# Patient Record
Sex: Female | Born: 1950
Health system: Southern US, Community
[De-identification: ages and names within clinical notes are randomized; demographics above are authoritative.]

## PROBLEM LIST (undated history)

## (undated) DIAGNOSIS — E119 Type 2 diabetes mellitus without complications: Secondary | ICD-10-CM

## (undated) DIAGNOSIS — E538 Deficiency of other specified B group vitamins: Secondary | ICD-10-CM

## (undated) DIAGNOSIS — F32A Depression, unspecified: Secondary | ICD-10-CM

## (undated) DIAGNOSIS — Z7901 Long term (current) use of anticoagulants: Secondary | ICD-10-CM

## (undated) DIAGNOSIS — K219 Gastro-esophageal reflux disease without esophagitis: Secondary | ICD-10-CM

## (undated) DIAGNOSIS — M1990 Unspecified osteoarthritis, unspecified site: Secondary | ICD-10-CM

## (undated) DIAGNOSIS — T8859XA Other complications of anesthesia, initial encounter: Secondary | ICD-10-CM

## (undated) DIAGNOSIS — G4700 Insomnia, unspecified: Secondary | ICD-10-CM

## (undated) DIAGNOSIS — D219 Benign neoplasm of connective and other soft tissue, unspecified: Secondary | ICD-10-CM

## (undated) DIAGNOSIS — I10 Essential (primary) hypertension: Secondary | ICD-10-CM

## (undated) DIAGNOSIS — R197 Diarrhea, unspecified: Secondary | ICD-10-CM

## (undated) DIAGNOSIS — E7800 Pure hypercholesterolemia, unspecified: Secondary | ICD-10-CM

## (undated) DIAGNOSIS — D510 Vitamin B12 deficiency anemia due to intrinsic factor deficiency: Secondary | ICD-10-CM

## (undated) DIAGNOSIS — G4730 Sleep apnea, unspecified: Secondary | ICD-10-CM

## (undated) DIAGNOSIS — T4145XA Adverse effect of unspecified anesthetic, initial encounter: Secondary | ICD-10-CM

## (undated) DIAGNOSIS — C4491 Basal cell carcinoma of skin, unspecified: Secondary | ICD-10-CM

## (undated) DIAGNOSIS — F329 Major depressive disorder, single episode, unspecified: Secondary | ICD-10-CM

## (undated) DIAGNOSIS — F419 Anxiety disorder, unspecified: Secondary | ICD-10-CM

## (undated) DIAGNOSIS — I480 Paroxysmal atrial fibrillation: Secondary | ICD-10-CM

## (undated) DIAGNOSIS — G43909 Migraine, unspecified, not intractable, without status migrainosus: Secondary | ICD-10-CM

## (undated) HISTORY — DX: Vitamin B12 deficiency anemia due to intrinsic factor deficiency: D51.0

## (undated) HISTORY — DX: Benign neoplasm of connective and other soft tissue, unspecified: D21.9

## (undated) HISTORY — DX: Long term (current) use of anticoagulants: Z79.01

## (undated) HISTORY — DX: Paroxysmal atrial fibrillation: I48.0

## (undated) HISTORY — DX: Insomnia, unspecified: G47.00

## (undated) HISTORY — PX: ACHILLES TENDON SURGERY: SHX542

## (undated) HISTORY — DX: Diarrhea, unspecified: R19.7

## (undated) HISTORY — PX: JOINT REPLACEMENT: SHX530

## (undated) HISTORY — PX: COLONOSCOPY: SHX174

## (undated) HISTORY — DX: Deficiency of other specified B group vitamins: E53.8

## (undated) HISTORY — PX: TONSILLECTOMY: SUR1361

## (undated) HISTORY — DX: Sleep apnea, unspecified: G47.30

---

## 1984-10-02 HISTORY — PX: CARPAL TUNNEL RELEASE: SHX101

## 1986-10-02 HISTORY — PX: APPENDECTOMY: SHX54

## 1986-10-02 HISTORY — PX: ABDOMINAL HYSTERECTOMY: SHX81

## 2002-01-28 ENCOUNTER — Encounter: Payer: Self-pay | Admitting: Urology

## 2002-01-28 ENCOUNTER — Encounter: Admission: RE | Admit: 2002-01-28 | Discharge: 2002-01-28 | Payer: Self-pay | Admitting: Urology

## 2002-03-06 ENCOUNTER — Encounter: Payer: Self-pay | Admitting: Obstetrics and Gynecology

## 2002-03-13 ENCOUNTER — Inpatient Hospital Stay (HOSPITAL_COMMUNITY): Admission: RE | Admit: 2002-03-13 | Discharge: 2002-03-15 | Payer: Self-pay | Admitting: Obstetrics and Gynecology

## 2002-10-02 HISTORY — PX: BILATERAL OOPHORECTOMY: SHX1221

## 2003-05-14 ENCOUNTER — Encounter: Payer: Self-pay | Admitting: Family Medicine

## 2003-05-14 ENCOUNTER — Encounter: Admission: RE | Admit: 2003-05-14 | Discharge: 2003-05-14 | Payer: Self-pay | Admitting: Family Medicine

## 2003-05-20 ENCOUNTER — Encounter: Admission: RE | Admit: 2003-05-20 | Discharge: 2003-05-20 | Payer: Self-pay | Admitting: Family Medicine

## 2003-05-20 ENCOUNTER — Encounter: Payer: Self-pay | Admitting: Family Medicine

## 2009-10-02 HISTORY — PX: CATARACT EXTRACTION W/ INTRAOCULAR LENS  IMPLANT, BILATERAL: SHX1307

## 2012-11-22 ENCOUNTER — Emergency Department (HOSPITAL_COMMUNITY)
Admission: EM | Admit: 2012-11-22 | Discharge: 2012-11-22 | Disposition: A | Discharge status: Home / Self Care | Payer: BC Managed Care – PPO | Attending: Emergency Medicine | Admitting: Emergency Medicine

## 2012-11-22 ENCOUNTER — Encounter (HOSPITAL_COMMUNITY): Payer: Self-pay | Admitting: Emergency Medicine

## 2012-11-22 ENCOUNTER — Emergency Department (HOSPITAL_COMMUNITY): Payer: BC Managed Care – PPO

## 2012-11-22 DIAGNOSIS — Z79899 Other long term (current) drug therapy: Secondary | ICD-10-CM | POA: Insufficient documentation

## 2012-11-22 DIAGNOSIS — I10 Essential (primary) hypertension: Secondary | ICD-10-CM | POA: Insufficient documentation

## 2012-11-22 DIAGNOSIS — E119 Type 2 diabetes mellitus without complications: Secondary | ICD-10-CM | POA: Insufficient documentation

## 2012-11-22 DIAGNOSIS — R110 Nausea: Secondary | ICD-10-CM | POA: Insufficient documentation

## 2012-11-22 DIAGNOSIS — Z7982 Long term (current) use of aspirin: Secondary | ICD-10-CM | POA: Insufficient documentation

## 2012-11-22 DIAGNOSIS — M129 Arthropathy, unspecified: Secondary | ICD-10-CM | POA: Insufficient documentation

## 2012-11-22 DIAGNOSIS — R42 Dizziness and giddiness: Secondary | ICD-10-CM

## 2012-11-22 HISTORY — DX: Unspecified osteoarthritis, unspecified site: M19.90

## 2012-11-22 HISTORY — DX: Essential (primary) hypertension: I10

## 2012-11-22 LAB — CBC
HCT: 37 % (ref 36.0–46.0)
Hemoglobin: 12.2 g/dL (ref 12.0–15.0)
MCHC: 33 g/dL (ref 30.0–36.0)
RBC: 4.64 MIL/uL (ref 3.87–5.11)
WBC: 7.4 10*3/uL (ref 4.0–10.5)

## 2012-11-22 LAB — BASIC METABOLIC PANEL
BUN: 9 mg/dL (ref 6–23)
CO2: 29 mEq/L (ref 19–32)
Chloride: 100 mEq/L (ref 96–112)
Glucose, Bld: 106 mg/dL — ABNORMAL HIGH (ref 70–99)
Potassium: 3.7 mEq/L (ref 3.5–5.1)

## 2012-11-22 LAB — GLUCOSE, CAPILLARY: Glucose-Capillary: 130 mg/dL — ABNORMAL HIGH (ref 70–99)

## 2012-11-22 MED ORDER — LORAZEPAM 2 MG/ML IJ SOLN: 1.0000 mg | Freq: Once | INTRAMUSCULAR | Status: DC

## 2012-11-22 MED ORDER — SODIUM CHLORIDE 0.9 % IV SOLN
1000.0000 mL | Freq: Once | INTRAVENOUS | Status: AC
  Administered 2012-11-22: 1000 mL via INTRAVENOUS

## 2012-11-22 MED ORDER — LORAZEPAM 2 MG/ML IJ SOLN
2.0000 mg | Freq: Once | INTRAMUSCULAR | Status: AC
  Administered 2012-11-22: 2 mg via INTRAVENOUS
  Filled 2012-11-22: qty 1

## 2012-11-22 MED ORDER — DIAZEPAM 5 MG PO TABS: 5.0000 mg | ORAL_TABLET | Freq: Four times a day (QID) | ORAL | Status: DC | PRN

## 2012-11-22 MED ORDER — SODIUM CHLORIDE 0.9 % IV SOLN
1000.0000 mL | INTRAVENOUS | Status: DC
  Administered 2012-11-22: 1000 mL via INTRAVENOUS

## 2012-11-22 NOTE — ED Notes (Signed)
BS checked by EMT Fayrene Fearing noted to be 130.

## 2012-11-22 NOTE — ED Notes (Signed)
Patient had asked for husband. He was not in the waiting room when we called for him.

## 2012-11-22 NOTE — ED Notes (Signed)
Per EMS, patient was picked up at [facility] after having 3 days of dizziness.   Patient claims "room feels like it is spinning".  Patient advised that she has fallen without injury secondary to "being wobbly".

## 2012-11-22 NOTE — ED Provider Notes (Signed)
History     CSN: [id number]  Arrival date & time 11/22/12  1046   First MD Initiated Contact with Patient 11/22/12 1132      Chief Complaint  Patient presents with  . Dizziness    (Consider location/radiation/quality/duration/timing/severity/associated sxs/prior treatment) The history is provided by the patient and the spouse.   patient reports worsening dizziness over the past several days.  She reports every time she stands up or looks down or twists her head to the side she becomes extremely or dizziness.  She describes a sense that the room is spinning around.  It makes her nauseated.  No vomiting.  No recent falls or trauma.  She's on anticoagulants.  She denies weakness of her upper lower extremities but which are primary care physician today for the symptoms he was concerned about some right-sided weakness.  She's had some unsteady gait.  No prior stroke.  She's had symptoms like this before in the past that resolved on her own.  Her symptoms are mild to moderate in severity.  She has some sense of "dizziness" now  Past Medical History  Diagnosis Date  . Diabetes mellitus without complication   . Hypertension   . Arthritis     Past Surgical History  Procedure Laterality Date  . Abdominal hysterectomy      No family history on file.  History  Substance Use Topics  . Smoking status: Never Smoker   . Smokeless tobacco: Not on file  . Alcohol Use: Yes    OB History   Grav Para Term Preterm Abortions TAB SAB Ect Mult Living                  Review of Systems  All other systems reviewed and are negative.    Allergies  Antivert  Home Medications   Current Outpatient Rx  Name  Route  Sig  Dispense  Refill  . amLODipine (NORVASC) 10 MG tablet   Oral   Take 10 mg by mouth daily.         Marland Kitchen aspirin 325 MG EC tablet   Oral   Take 325 mg by mouth every morning.         . Calcium Carbonate-Vitamin D (CALCIUM 600 + D PO)   Oral   Take 1 tablet by mouth  daily.         . citalopram (CELEXA) 40 MG tablet   Oral   Take 40 mg by mouth every morning.         Marland Kitchen estradiol (ESTRACE) 2 MG tablet   Oral   Take 2 mg by mouth every morning.         . metFORMIN (GLUCOPHAGE) 1000 MG tablet   Oral   Take 1,000 mg by mouth daily.         . Multiple Vitamin (MULTIVITAMIN WITH MINERALS) TABS   Oral   Take 1 tablet by mouth daily.         Marland Kitchen omega-3 acid ethyl esters (LOVAZA) 1 G capsule   Oral   Take 2 g by mouth 2 (two) times daily.         Marland Kitchen omeprazole (PRILOSEC) 40 MG capsule   Oral   Take 40 mg by mouth every morning.         . pravastatin (PRAVACHOL) 20 MG tablet   Oral   Take 20 mg by mouth at bedtime.         Marland Kitchen zolpidem (AMBIEN) 10 MG tablet  Oral   Take 10 mg by mouth at bedtime.         . diazepam (VALIUM) 5 MG tablet   Oral   Take 1 tablet (5 mg total) by mouth every 6 (six) hours as needed (dizziness).   10 tablet   0     BP 109/63  Pulse 72  Temp(Src) 98.6 F (37 C) (Oral)  Resp 17  Ht 5\' 3"  (1.6 m)  Wt 215 lb (97.523 kg)  BMI 38.09 kg/m2  SpO2 98%  Physical Exam  Nursing note and vitals reviewed. Constitutional: She is oriented to person, place, and time. She appears well-developed and well-nourished. No distress.  HENT:  Head: Normocephalic and atraumatic.  Eyes: EOM are normal. Pupils are equal, round, and reactive to light.  Neck: Normal range of motion.  Cardiovascular: Normal rate, regular rhythm and normal heart sounds.   Pulmonary/Chest: Effort normal and breath sounds normal.  Abdominal: Soft. She exhibits no distension. There is no tenderness.  Musculoskeletal: Normal range of motion.  Neurological: She is alert and oriented to person, place, and time.  5/5 strength in major muscle groups of  bilateral upper and lower extremities. Speech normal. No facial asymetry.   Skin: Skin is warm and dry.  Psychiatric: She has a normal mood and affect. Judgment normal.    ED Course   Procedures (including critical care time)  Labs Reviewed  GLUCOSE, CAPILLARY - Abnormal; Notable for the following:    Glucose-Capillary 130 (*)    All other components within normal limits  BASIC METABOLIC PANEL - Abnormal; Notable for the following:    Glucose, Bld 106 (*)    Creatinine, Ser 0.47 (*)    All other components within normal limits  GLUCOSE, CAPILLARY - Abnormal; Notable for the following:    Glucose-Capillary 101 (*)    All other components within normal limits  CBC   Mr Brain Wo Contrast  11/22/2012  *RADIOLOGY REPORT*  Clinical Data: Dizziness and vertigo of several days duration.  MRI HEAD WITHOUT CONTRAST  Technique:  Multiplanar, multiecho pulse sequences of the brain and surrounding structures were obtained according to standard protocol without intravenous contrast.  Comparison: None.  Findings: The brain has a normal appearance without evidence of old or acute infarction, mass lesion, hemorrhage, hydrocephalus or extra-axial collection.  No pituitary mass.  No fluid in the sinuses, middle ears or mastoids.  No skull or skull base lesion.  IMPRESSION: Normal examination.  No lesions seen to explain dizziness.   Original Report Authenticated By: Paulina Fusi, M.D.    I personally reviewed the imaging tests through PACS system I reviewed available ER/hospitalization records through the EMR   1. Vertigo       MDM  MRI normal.  The patient feels much better after IV Ativan.  This appears to be peripheral vertigo.  PCP followup.  Home with Valium.  She understands return the ER for new or worsening symptoms.        Lyanne Co, MD 11/22/12 1556

## 2012-11-22 NOTE — ED Notes (Signed)
Patient went to the restroom with assistance. (wheelchair)  Patient unstable on feet.

## 2013-09-09 ENCOUNTER — Ambulatory Visit: Payer: BC Managed Care – PPO | Admitting: Physical Therapy

## 2013-09-11 ENCOUNTER — Other Ambulatory Visit: Payer: Self-pay | Admitting: Orthopaedic Surgery

## 2013-09-11 DIAGNOSIS — M25511 Pain in right shoulder: Secondary | ICD-10-CM

## 2013-09-18 ENCOUNTER — Other Ambulatory Visit: Payer: BC Managed Care – PPO

## 2013-10-06 ENCOUNTER — Other Ambulatory Visit: Payer: BC Managed Care – PPO

## 2013-11-30 HISTORY — PX: SHOULDER ARTHROSCOPY DISTAL CLAVICLE EXCISION AND OPEN ROTATOR CUFF REPAIR: SHX2396

## 2013-12-01 DIAGNOSIS — M25519 Pain in unspecified shoulder: Secondary | ICD-10-CM | POA: Diagnosis not present

## 2013-12-10 ENCOUNTER — Ambulatory Visit
Admission: RE | Admit: 2013-12-10 | Discharge: 2013-12-10 | Disposition: A | Discharge status: Home / Self Care | Payer: Medicare Other | Source: Ambulatory Visit | Attending: Orthopaedic Surgery | Admitting: Orthopaedic Surgery

## 2013-12-10 DIAGNOSIS — M67919 Unspecified disorder of synovium and tendon, unspecified shoulder: Secondary | ICD-10-CM | POA: Diagnosis not present

## 2013-12-10 DIAGNOSIS — M25511 Pain in right shoulder: Secondary | ICD-10-CM

## 2013-12-10 DIAGNOSIS — M25419 Effusion, unspecified shoulder: Secondary | ICD-10-CM | POA: Diagnosis not present

## 2013-12-10 DIAGNOSIS — M19019 Primary osteoarthritis, unspecified shoulder: Secondary | ICD-10-CM | POA: Diagnosis not present

## 2013-12-15 DIAGNOSIS — M75120 Complete rotator cuff tear or rupture of unspecified shoulder, not specified as traumatic: Secondary | ICD-10-CM | POA: Diagnosis not present

## 2013-12-25 DIAGNOSIS — M67919 Unspecified disorder of synovium and tendon, unspecified shoulder: Secondary | ICD-10-CM | POA: Diagnosis not present

## 2013-12-25 DIAGNOSIS — M719 Bursopathy, unspecified: Secondary | ICD-10-CM | POA: Diagnosis not present

## 2013-12-25 DIAGNOSIS — M75120 Complete rotator cuff tear or rupture of unspecified shoulder, not specified as traumatic: Secondary | ICD-10-CM | POA: Diagnosis not present

## 2013-12-25 DIAGNOSIS — G8918 Other acute postprocedural pain: Secondary | ICD-10-CM | POA: Diagnosis not present

## 2014-01-12 DIAGNOSIS — M25519 Pain in unspecified shoulder: Secondary | ICD-10-CM | POA: Diagnosis not present

## 2014-01-14 DIAGNOSIS — M25519 Pain in unspecified shoulder: Secondary | ICD-10-CM | POA: Diagnosis not present

## 2014-01-19 DIAGNOSIS — M25519 Pain in unspecified shoulder: Secondary | ICD-10-CM | POA: Diagnosis not present

## 2014-01-26 DIAGNOSIS — M25519 Pain in unspecified shoulder: Secondary | ICD-10-CM | POA: Diagnosis not present

## 2014-01-28 DIAGNOSIS — M25519 Pain in unspecified shoulder: Secondary | ICD-10-CM | POA: Diagnosis not present

## 2014-01-30 DIAGNOSIS — L570 Actinic keratosis: Secondary | ICD-10-CM | POA: Diagnosis not present

## 2014-01-30 DIAGNOSIS — Z85828 Personal history of other malignant neoplasm of skin: Secondary | ICD-10-CM | POA: Diagnosis not present

## 2014-01-30 DIAGNOSIS — Q828 Other specified congenital malformations of skin: Secondary | ICD-10-CM | POA: Diagnosis not present

## 2014-02-02 DIAGNOSIS — M25519 Pain in unspecified shoulder: Secondary | ICD-10-CM | POA: Diagnosis not present

## 2014-02-04 DIAGNOSIS — M25519 Pain in unspecified shoulder: Secondary | ICD-10-CM | POA: Diagnosis not present

## 2014-02-09 DIAGNOSIS — M25519 Pain in unspecified shoulder: Secondary | ICD-10-CM | POA: Diagnosis not present

## 2014-02-16 DIAGNOSIS — M25519 Pain in unspecified shoulder: Secondary | ICD-10-CM | POA: Diagnosis not present

## 2014-02-17 DIAGNOSIS — E781 Pure hyperglyceridemia: Secondary | ICD-10-CM | POA: Diagnosis not present

## 2014-02-17 DIAGNOSIS — G4700 Insomnia, unspecified: Secondary | ICD-10-CM | POA: Diagnosis not present

## 2014-02-17 DIAGNOSIS — E119 Type 2 diabetes mellitus without complications: Secondary | ICD-10-CM | POA: Diagnosis not present

## 2014-02-17 DIAGNOSIS — Z23 Encounter for immunization: Secondary | ICD-10-CM | POA: Diagnosis not present

## 2014-02-17 DIAGNOSIS — F410 Panic disorder [episodic paroxysmal anxiety] without agoraphobia: Secondary | ICD-10-CM | POA: Diagnosis not present

## 2014-02-17 DIAGNOSIS — E669 Obesity, unspecified: Secondary | ICD-10-CM | POA: Diagnosis not present

## 2014-02-17 DIAGNOSIS — E782 Mixed hyperlipidemia: Secondary | ICD-10-CM | POA: Diagnosis not present

## 2014-02-17 DIAGNOSIS — I10 Essential (primary) hypertension: Secondary | ICD-10-CM | POA: Diagnosis not present

## 2014-02-18 DIAGNOSIS — M25519 Pain in unspecified shoulder: Secondary | ICD-10-CM | POA: Diagnosis not present

## 2014-02-24 DIAGNOSIS — M25519 Pain in unspecified shoulder: Secondary | ICD-10-CM | POA: Diagnosis not present

## 2014-03-02 DIAGNOSIS — M1710 Unilateral primary osteoarthritis, unspecified knee: Secondary | ICD-10-CM | POA: Diagnosis not present

## 2014-03-02 DIAGNOSIS — M25519 Pain in unspecified shoulder: Secondary | ICD-10-CM | POA: Diagnosis not present

## 2014-03-04 DIAGNOSIS — M25519 Pain in unspecified shoulder: Secondary | ICD-10-CM | POA: Diagnosis not present

## 2014-03-10 DIAGNOSIS — M25519 Pain in unspecified shoulder: Secondary | ICD-10-CM | POA: Diagnosis not present

## 2014-03-12 DIAGNOSIS — M25519 Pain in unspecified shoulder: Secondary | ICD-10-CM | POA: Diagnosis not present

## 2014-03-17 DIAGNOSIS — M25519 Pain in unspecified shoulder: Secondary | ICD-10-CM | POA: Diagnosis not present

## 2014-03-25 DIAGNOSIS — M25519 Pain in unspecified shoulder: Secondary | ICD-10-CM | POA: Diagnosis not present

## 2014-04-02 DIAGNOSIS — M25519 Pain in unspecified shoulder: Secondary | ICD-10-CM | POA: Diagnosis not present

## 2014-04-08 DIAGNOSIS — M25519 Pain in unspecified shoulder: Secondary | ICD-10-CM | POA: Diagnosis not present

## 2014-04-13 DIAGNOSIS — M25519 Pain in unspecified shoulder: Secondary | ICD-10-CM | POA: Diagnosis not present

## 2014-04-16 DIAGNOSIS — M25519 Pain in unspecified shoulder: Secondary | ICD-10-CM | POA: Diagnosis not present

## 2014-05-06 DIAGNOSIS — M25519 Pain in unspecified shoulder: Secondary | ICD-10-CM | POA: Diagnosis not present

## 2014-05-11 DIAGNOSIS — M25519 Pain in unspecified shoulder: Secondary | ICD-10-CM | POA: Diagnosis not present

## 2014-06-02 DIAGNOSIS — I10 Essential (primary) hypertension: Secondary | ICD-10-CM | POA: Diagnosis not present

## 2014-06-02 DIAGNOSIS — F410 Panic disorder [episodic paroxysmal anxiety] without agoraphobia: Secondary | ICD-10-CM | POA: Diagnosis not present

## 2014-06-02 DIAGNOSIS — E669 Obesity, unspecified: Secondary | ICD-10-CM | POA: Diagnosis not present

## 2014-06-02 DIAGNOSIS — E119 Type 2 diabetes mellitus without complications: Secondary | ICD-10-CM | POA: Diagnosis not present

## 2014-06-02 DIAGNOSIS — E782 Mixed hyperlipidemia: Secondary | ICD-10-CM | POA: Diagnosis not present

## 2014-06-02 DIAGNOSIS — K219 Gastro-esophageal reflux disease without esophagitis: Secondary | ICD-10-CM | POA: Diagnosis not present

## 2014-06-15 DIAGNOSIS — M25519 Pain in unspecified shoulder: Secondary | ICD-10-CM | POA: Diagnosis not present

## 2014-06-15 DIAGNOSIS — M25569 Pain in unspecified knee: Secondary | ICD-10-CM | POA: Diagnosis not present

## 2014-06-15 DIAGNOSIS — M1710 Unilateral primary osteoarthritis, unspecified knee: Secondary | ICD-10-CM | POA: Diagnosis not present

## 2014-06-15 DIAGNOSIS — M75120 Complete rotator cuff tear or rupture of unspecified shoulder, not specified as traumatic: Secondary | ICD-10-CM | POA: Diagnosis not present

## 2014-08-17 DIAGNOSIS — Z1389 Encounter for screening for other disorder: Secondary | ICD-10-CM | POA: Diagnosis not present

## 2014-08-17 DIAGNOSIS — K219 Gastro-esophageal reflux disease without esophagitis: Secondary | ICD-10-CM | POA: Diagnosis not present

## 2014-08-17 DIAGNOSIS — F410 Panic disorder [episodic paroxysmal anxiety] without agoraphobia: Secondary | ICD-10-CM | POA: Diagnosis not present

## 2014-08-17 DIAGNOSIS — M1990 Unspecified osteoarthritis, unspecified site: Secondary | ICD-10-CM | POA: Diagnosis not present

## 2014-08-17 DIAGNOSIS — E119 Type 2 diabetes mellitus without complications: Secondary | ICD-10-CM | POA: Diagnosis not present

## 2014-08-17 DIAGNOSIS — Z8249 Family history of ischemic heart disease and other diseases of the circulatory system: Secondary | ICD-10-CM | POA: Diagnosis not present

## 2014-08-17 DIAGNOSIS — E785 Hyperlipidemia, unspecified: Secondary | ICD-10-CM | POA: Diagnosis not present

## 2014-08-17 DIAGNOSIS — I10 Essential (primary) hypertension: Secondary | ICD-10-CM | POA: Diagnosis not present

## 2014-09-09 DIAGNOSIS — M1711 Unilateral primary osteoarthritis, right knee: Secondary | ICD-10-CM | POA: Diagnosis not present

## 2014-09-09 DIAGNOSIS — M25562 Pain in left knee: Secondary | ICD-10-CM | POA: Diagnosis not present

## 2014-09-29 ENCOUNTER — Other Ambulatory Visit (HOSPITAL_COMMUNITY): Payer: Self-pay | Admitting: Orthopaedic Surgery

## 2014-09-29 NOTE — Pre-Procedure Instructions (Signed)
Debra Wright  09/29/2014   Your procedure is scheduled on:  Tuesday, January 12.  Report to [facility] Admitting at 1:30 AM.  Call this number if you have problems the morning of surgery: [phone]               For any other questions, please call [phone], Monday - Friday 8 AM - 4 PM.  Remember:   Do not eat food or drink liquids after midnight Monday, January 11.   Take these medicines the morning of surgery with A SIP OF WATER: amLODipine (NORVASC), citalopram (CELEXA), omeprazole (PRILOSEC),estradiol (ESTRACE).                 Stop taking Aspirin, Vitamins and Herbal products (omega-3 acid ethyl esters (LOVAZA) on January 4.   Do not wear jewelry, make-up or nail polish.  Do not wear lotions, powders, or perfumes.   Do not shave 48 hours prior to surgery.   Do not bring valuables to the hospital.              [facility] is not responsible for any belongings or valuables.               Contacts, dentures or bridgework may not be worn into surgery.  Leave suitcase in the car. After surgery it may be brought to your room.  For patients admitted to the hospital, discharge time is determined by your treatment team.               Patients discharged the day of surgery will not be allowed to drive home.  Name and phone number of your driver: -   Special Instructions: -   Please read over the following fact sheets that you were given: Pain Booklet, Coughing and Deep Breathing, Blood Transfusion Information and Surgical Site Infection Prevention

## 2014-09-30 ENCOUNTER — Other Ambulatory Visit (HOSPITAL_COMMUNITY): Payer: Self-pay | Admitting: Orthopaedic Surgery

## 2014-09-30 ENCOUNTER — Encounter (HOSPITAL_COMMUNITY): Payer: Self-pay

## 2014-09-30 ENCOUNTER — Encounter (HOSPITAL_COMMUNITY)
Admission: RE | Admit: 2014-09-30 | Discharge: 2014-09-30 | Disposition: A | Discharge status: Home / Self Care | Payer: Medicare Other | Source: Ambulatory Visit | Attending: Orthopaedic Surgery | Admitting: Orthopaedic Surgery

## 2014-09-30 DIAGNOSIS — Z01818 Encounter for other preprocedural examination: Secondary | ICD-10-CM | POA: Insufficient documentation

## 2014-09-30 DIAGNOSIS — Z7901 Long term (current) use of anticoagulants: Secondary | ICD-10-CM | POA: Diagnosis not present

## 2014-09-30 DIAGNOSIS — I10 Essential (primary) hypertension: Secondary | ICD-10-CM | POA: Diagnosis not present

## 2014-09-30 DIAGNOSIS — R9431 Abnormal electrocardiogram [ECG] [EKG]: Secondary | ICD-10-CM | POA: Diagnosis not present

## 2014-09-30 DIAGNOSIS — Z7982 Long term (current) use of aspirin: Secondary | ICD-10-CM | POA: Insufficient documentation

## 2014-09-30 DIAGNOSIS — Z79899 Other long term (current) drug therapy: Secondary | ICD-10-CM | POA: Insufficient documentation

## 2014-09-30 DIAGNOSIS — M1712 Unilateral primary osteoarthritis, left knee: Secondary | ICD-10-CM | POA: Diagnosis not present

## 2014-09-30 DIAGNOSIS — E119 Type 2 diabetes mellitus without complications: Secondary | ICD-10-CM | POA: Insufficient documentation

## 2014-09-30 HISTORY — DX: Gastro-esophageal reflux disease without esophagitis: K21.9

## 2014-09-30 HISTORY — DX: Depression, unspecified: F32.A

## 2014-09-30 HISTORY — DX: Major depressive disorder, single episode, unspecified: F32.9

## 2014-09-30 HISTORY — DX: Anxiety disorder, unspecified: F41.9

## 2014-09-30 LAB — APTT: aPTT: 31 seconds (ref 24–37)

## 2014-09-30 LAB — URINALYSIS, ROUTINE W REFLEX MICROSCOPIC
BILIRUBIN URINE: NEGATIVE
Glucose, UA: 100 mg/dL — AB
Hgb urine dipstick: NEGATIVE
KETONES UR: NEGATIVE mg/dL
Leukocytes, UA: NEGATIVE
Nitrite: NEGATIVE
Protein, ur: NEGATIVE mg/dL
Specific Gravity, Urine: 1.015 (ref 1.005–1.030)
Urobilinogen, UA: 0.2 mg/dL (ref 0.0–1.0)
pH: 6 (ref 5.0–8.0)

## 2014-09-30 LAB — PROTIME-INR
INR: 1.07 (ref 0.00–1.49)
Prothrombin Time: 14.1 seconds (ref 11.6–15.2)

## 2014-09-30 LAB — CBC
HEMATOCRIT: 38 % (ref 36.0–46.0)
Hemoglobin: 12.1 g/dL (ref 12.0–15.0)
MCH: 24.8 pg — AB (ref 26.0–34.0)
MCHC: 31.8 g/dL (ref 30.0–36.0)
MCV: 77.9 fL — AB (ref 78.0–100.0)
Platelets: 239 10*3/uL (ref 150–400)
RBC: 4.88 MIL/uL (ref 3.87–5.11)
RDW: 14.1 % (ref 11.5–15.5)
WBC: 4.9 10*3/uL (ref 4.0–10.5)

## 2014-09-30 LAB — BASIC METABOLIC PANEL
Anion gap: 8 (ref 5–15)
BUN: 10 mg/dL (ref 6–23)
CALCIUM: 9.3 mg/dL (ref 8.4–10.5)
CHLORIDE: 99 meq/L (ref 96–112)
CO2: 29 mmol/L (ref 19–32)
CREATININE: 0.65 mg/dL (ref 0.50–1.10)
GFR calc Af Amer: >90 mL/min (ref >90)
GFR calc non Af Amer: >90 mL/min (ref >90)
GLUCOSE: 178 mg/dL — AB (ref 70–99)
POTASSIUM: 3.6 mmol/L (ref 3.5–5.1)
SODIUM: 136 mmol/L (ref 135–145)

## 2014-09-30 LAB — SURGICAL PCR SCREEN
MRSA, PCR: NEGATIVE
STAPHYLOCOCCUS AUREUS: POSITIVE — AB

## 2014-09-30 NOTE — Progress Notes (Signed)
Mupirocin Ointment Rx called into [facility] on [address] for positive PCR of Staph. Called pt and left message for pt to return call.

## 2014-09-30 NOTE — Pre-Procedure Instructions (Deleted)
Debra Wright  09/30/2014   Your procedure is scheduled on:  Tuesday, January 12.  Report to [facility] Admitting at 1:30 AM.  Call this number if you have problems the morning of surgery: [phone]               For any other questions, please call [phone], Monday - Friday 8 AM - 4 PM.  Remember:   Do not eat food or drink liquids after midnight Monday, January 11.   Take these medicines the morning of surgery with A SIP OF WATER: amLODipine (NORVASC), citalopram (CELEXA), omeprazole (PRILOSEC),estradiol (ESTRACE).valium if need                 Stop taking Aspirin, Vitamins and Herbal products (omega-3 acid ethyl esters (LOVAZA) on January 4.   Do not wear jewelry, make-up or nail polish.  Do not wear lotions, powders, or perfumes.   Do not shave 48 hours prior to surgery.   Do not bring valuables to the hospital.              [facility] is not responsible for any belongings or valuables.               Contacts, dentures or bridgework may not be worn into surgery.  Leave suitcase in the car. After surgery it may be brought to your room.  For patients admitted to the hospital, discharge time is determined by your treatment team.               Patients discharged the day of surgery will not be allowed to drive home.  Name and phone number of your driver: -   Special Instructions: -   Please read over the following fact sheets that you were given: Pain Booklet, Coughing and Deep Breathing, Blood Transfusion Information and Surgical Site Infection Prevention

## 2014-09-30 NOTE — Pre-Procedure Instructions (Addendum)
Debra Wright  09/30/2014   Your procedure is scheduled on:  Tuesday, January 12.  Report to [facility] Admitting at 1100 AM.  Call this number if you have problems the morning of surgery: [phone]               For any other questions, please call [phone], Monday - Friday 8 AM - 4 PM.  Remember:   Do not eat food or drink liquids after midnight Monday, January 11.   Take these medicines the morning of surgery with A SIP OF WATER: amLODipine (NORVASC), citalopram (CELEXA), omeprazole (PRILOSEC).valium, pain med if needed                  Stop taking Aspirin, Vitamins and Herbal products (omega-3 acid ethyl esters (LOVAZA) on January 4.     Do not take diabetic med am of surgery   Do not wear jewelry, make-up or nail polish.  Do not wear lotions, powders, or perfumes.   Do not shave 48 hours prior to surgery.   Do not bring valuables to the hospital.              [facility] is not responsible for any belongings or valuables.               Contacts, dentures or bridgework may not be worn into surgery.  Leave suitcase in the car. After surgery it may be brought to your room.  For patients admitted to the hospital, discharge time is determined by your treatment team.               Patients discharged the day of surgery will not be allowed to drive home.  Name and phone number of your driver: -   Special Instructions: - Special Instructions: [place] - Preparing for Surgery  Before surgery, you can play an important role.  Because skin is not sterile, your skin needs to be as free of germs as possible.  You can reduce the number of germs on you skin by washing with CHG (chlorahexidine gluconate) soap before surgery.  CHG is an antiseptic cleaner which kills germs and bonds with the skin to continue killing germs even after washing.  Please DO NOT use if you have an allergy to CHG or antibacterial soaps.  If your skin becomes reddened/irritated stop using the CHG and  inform your nurse when you arrive at Short Stay.  Do not shave (including legs and underarms) for at least 48 hours prior to the first CHG shower.  You may shave your face.  Please follow these instructions carefully:   1.  Shower with CHG Soap the night before surgery and the morning of Surgery.  2.  If you choose to wash your hair, wash your hair first as usual with your normal shampoo.  3.  After you shampoo, rinse your hair and body thoroughly to remove the Shampoo.  4.  Use CHG as you would any other liquid soap.  You can apply chg directly  to the skin and wash gently with scrungie or a clean washcloth.  5.  Apply the CHG Soap to your body ONLY FROM THE NECK DOWN.  Do not use on open wounds or open sores.  Avoid contact with your eyes ears, mouth and genitals (private parts).  Wash genitals (private parts)       with your normal soap.  6.  Wash thoroughly, paying special attention to the area where your surgery will  be performed.  7.  Thoroughly rinse your body with warm water from the neck down.  8.  DO NOT shower/wash with your normal soap after using and rinsing off the CHG Soap.  9.  Pat yourself dry with a clean towel.            10.  Wear clean pajamas.            11.  Place clean sheets on your bed the night of your first shower and do not sleep with pets.  Day of Surgery  Do not apply any lotions/deodorants the morning of surgery.  Please wear clean clothes to the hospital/surgery center.   Please read over the following fact sheets that you were given: Pain Booklet, Coughing and Deep Breathing, Blood Transfusion Information and Surgical Site Infection Prevention

## 2014-09-30 NOTE — Progress Notes (Signed)
   09/30/14 1315  OBSTRUCTIVE SLEEP APNEA  Have you ever been diagnosed with sleep apnea through a sleep study? No  Do you snore loudly (loud enough to be heard through closed doors)?  0  Do you often feel tired, fatigued, or sleepy during the daytime? 0  Has anyone observed you stop breathing during your sleep? 0  Do you have, or are you being treated for high blood pressure? 1  BMI more than 35 kg/m2? 1  Age over 65 years old? 1  Neck circumference greater than 40 cm/16 inches? 1 (16.5)  Gender: 0  Obstructive Sleep Apnea Score 4  Score 4 or greater  Results sent to PCP

## 2014-10-12 MED ORDER — CEFAZOLIN SODIUM-DEXTROSE 2-3 GM-% IV SOLR
2.0000 g | INTRAVENOUS | Status: AC
  Administered 2014-10-13: 2 g via INTRAVENOUS
  Filled 2014-10-12: qty 50

## 2014-10-12 NOTE — Anesthesia Preprocedure Evaluation (Addendum)
Anesthesia Evaluation  Patient identified by MRN, date of birth, ID band Patient awake    Reviewed: Allergy & Precautions, NPO status , Patient's Chart, lab work & pertinent test results  History of Anesthesia Complications Negative for: history of anesthetic complications  Airway Mallampati: III  TM Distance: >3 FB Neck ROM: Full    Dental no notable dental hx. (+) Dental Advisory Given   Pulmonary neg pulmonary ROS,  breath sounds clear to auscultation  Pulmonary exam normal       Cardiovascular hypertension, Pt. on medications Rhythm:Regular Rate:Normal     Neuro/Psych PSYCHIATRIC DISORDERS Anxiety Depression negative neurological ROS     GI/Hepatic Neg liver ROS, GERD-  Medicated and Controlled,  Endo/Other  diabetes, Type 2, Oral Hypoglycemic Agentsobesity  Renal/GU negative Renal ROS  negative genitourinary   Musculoskeletal  (+) Arthritis -, Osteoarthritis,    Abdominal   Peds negative pediatric ROS (+)  Hematology negative hematology ROS (+)   Anesthesia Other Findings   Reproductive/Obstetrics negative OB ROS                          Anesthesia Physical Anesthesia Plan  ASA: II  Anesthesia Plan: Spinal   Post-op Pain Management:    Induction: Intravenous  Airway Management Planned: Simple Face Mask and Natural Airway  Additional Equipment:   Intra-op Plan:   Post-operative Plan:   Informed Consent: I have reviewed the patients History and Physical, chart, labs and discussed the procedure including the risks, benefits and alternatives for the proposed anesthesia with the patient or authorized representative who has indicated his/her understanding and acceptance.   Dental advisory given  Plan Discussed with: CRNA  Anesthesia Plan Comments:       Anesthesia Quick Evaluation

## 2014-10-12 NOTE — Progress Notes (Signed)
Notified via voicemail to arrive at 1030 tomorrow morning.

## 2014-10-13 ENCOUNTER — Encounter (HOSPITAL_COMMUNITY)
Admission: RE | Disposition: A | Discharge status: Home Health | Payer: Self-pay | Source: Ambulatory Visit | Attending: Orthopaedic Surgery

## 2014-10-13 ENCOUNTER — Inpatient Hospital Stay (HOSPITAL_COMMUNITY): Payer: Medicare Other | Admitting: Anesthesiology

## 2014-10-13 ENCOUNTER — Encounter (HOSPITAL_COMMUNITY): Payer: Self-pay | Admitting: *Deleted

## 2014-10-13 ENCOUNTER — Inpatient Hospital Stay (HOSPITAL_COMMUNITY)
Admission: RE | Admit: 2014-10-13 | Discharge: 2014-10-16 | DRG: 470 | Disposition: A | Discharge status: Home Health | Payer: Medicare Other | Source: Ambulatory Visit | Attending: Orthopaedic Surgery | Admitting: Orthopaedic Surgery

## 2014-10-13 ENCOUNTER — Inpatient Hospital Stay (HOSPITAL_COMMUNITY): Payer: Medicare Other

## 2014-10-13 DIAGNOSIS — I10 Essential (primary) hypertension: Secondary | ICD-10-CM | POA: Diagnosis not present

## 2014-10-13 DIAGNOSIS — M1712 Unilateral primary osteoarthritis, left knee: Principal | ICD-10-CM | POA: Diagnosis present

## 2014-10-13 DIAGNOSIS — M25562 Pain in left knee: Secondary | ICD-10-CM | POA: Diagnosis not present

## 2014-10-13 DIAGNOSIS — E119 Type 2 diabetes mellitus without complications: Secondary | ICD-10-CM | POA: Diagnosis not present

## 2014-10-13 DIAGNOSIS — Z79899 Other long term (current) drug therapy: Secondary | ICD-10-CM | POA: Diagnosis not present

## 2014-10-13 DIAGNOSIS — F329 Major depressive disorder, single episode, unspecified: Secondary | ICD-10-CM | POA: Diagnosis present

## 2014-10-13 DIAGNOSIS — K219 Gastro-esophageal reflux disease without esophagitis: Secondary | ICD-10-CM | POA: Diagnosis present

## 2014-10-13 DIAGNOSIS — Z96652 Presence of left artificial knee joint: Secondary | ICD-10-CM

## 2014-10-13 DIAGNOSIS — E780 Pure hypercholesterolemia: Secondary | ICD-10-CM | POA: Diagnosis present

## 2014-10-13 DIAGNOSIS — Z471 Aftercare following joint replacement surgery: Secondary | ICD-10-CM | POA: Diagnosis not present

## 2014-10-13 DIAGNOSIS — Z7901 Long term (current) use of anticoagulants: Secondary | ICD-10-CM

## 2014-10-13 DIAGNOSIS — M179 Osteoarthritis of knee, unspecified: Secondary | ICD-10-CM | POA: Diagnosis not present

## 2014-10-13 DIAGNOSIS — D62 Acute posthemorrhagic anemia: Secondary | ICD-10-CM | POA: Diagnosis not present

## 2014-10-13 HISTORY — DX: Pure hypercholesterolemia, unspecified: E78.00

## 2014-10-13 HISTORY — DX: Basal cell carcinoma of skin, unspecified: C44.91

## 2014-10-13 HISTORY — DX: Type 2 diabetes mellitus without complications: E11.9

## 2014-10-13 HISTORY — PX: TOTAL KNEE ARTHROPLASTY: SHX125

## 2014-10-13 HISTORY — DX: Migraine, unspecified, not intractable, without status migrainosus: G43.909

## 2014-10-13 LAB — GLUCOSE, CAPILLARY
GLUCOSE-CAPILLARY: 142 mg/dL — AB (ref 70–99)
Glucose-Capillary: 133 mg/dL — ABNORMAL HIGH (ref 70–99)
Glucose-Capillary: 114 mg/dL — ABNORMAL HIGH (ref 70–99)
Glucose-Capillary: 176 mg/dL — ABNORMAL HIGH (ref 70–99)

## 2014-10-13 SURGERY — ARTHROPLASTY, KNEE, TOTAL
Anesthesia: Spinal | Site: Knee | Laterality: Left

## 2014-10-13 MED ORDER — ONDANSETRON HCL 4 MG/2ML IJ SOLN: 4.0000 mg | Freq: Four times a day (QID) | INTRAMUSCULAR | Status: DC | PRN

## 2014-10-13 MED ORDER — FENTANYL CITRATE 0.05 MG/ML IJ SOLN
INTRAMUSCULAR | Status: DC | PRN
  Administered 2014-10-13 (×2): 25 ug via INTRAVENOUS
  Administered 2014-10-13: 50 ug via INTRAVENOUS
  Administered 2014-10-13: 25 ug via INTRAVENOUS
  Administered 2014-10-13: 50 ug via INTRAVENOUS

## 2014-10-13 MED ORDER — HYDROMORPHONE HCL 1 MG/ML IJ SOLN
1.0000 mg | INTRAMUSCULAR | Status: DC | PRN
  Administered 2014-10-13 – 2014-10-15 (×9): 1 mg via INTRAVENOUS
  Filled 2014-10-13 (×9): qty 1

## 2014-10-13 MED ORDER — ASPIRIN EC 81 MG PO TBEC
81.0000 mg | DELAYED_RELEASE_TABLET | Freq: Every day | ORAL | Status: DC
  Administered 2014-10-13 – 2014-10-16 (×4): 81 mg via ORAL
  Filled 2014-10-13 (×4): qty 1

## 2014-10-13 MED ORDER — ALUM & MAG HYDROXIDE-SIMETH 200-200-20 MG/5ML PO SUSP: 30.0000 mL | ORAL | Status: DC | PRN

## 2014-10-13 MED ORDER — PROPOFOL 10 MG/ML IV BOLUS
INTRAVENOUS | Status: AC
  Filled 2014-10-13: qty 20

## 2014-10-13 MED ORDER — ACETAMINOPHEN 325 MG PO TABS
650.0000 mg | ORAL_TABLET | Freq: Four times a day (QID) | ORAL | Status: DC | PRN
  Filled 2014-10-13: qty 2

## 2014-10-13 MED ORDER — DOCUSATE SODIUM 100 MG PO CAPS
100.0000 mg | ORAL_CAPSULE | Freq: Two times a day (BID) | ORAL | Status: DC
  Administered 2014-10-13 – 2014-10-16 (×6): 100 mg via ORAL
  Filled 2014-10-13 (×6): qty 1

## 2014-10-13 MED ORDER — ZOLPIDEM TARTRATE 5 MG PO TABS: 5.0000 mg | ORAL_TABLET | Freq: Every evening | ORAL | Status: DC | PRN

## 2014-10-13 MED ORDER — BIOTIN 5000 MCG PO TABS: 5000.0000 ug | ORAL_TABLET | Freq: Every day | ORAL | Status: DC

## 2014-10-13 MED ORDER — METOCLOPRAMIDE HCL 10 MG PO TABS: 5.0000 mg | ORAL_TABLET | Freq: Three times a day (TID) | ORAL | Status: DC | PRN

## 2014-10-13 MED ORDER — POLYETHYLENE GLYCOL 3350 17 G PO PACK: 17.0000 g | PACK | Freq: Every day | ORAL | Status: DC | PRN

## 2014-10-13 MED ORDER — INSULIN ASPART 100 UNIT/ML ~~LOC~~ SOLN: 0.0000 [IU] | Freq: Every day | SUBCUTANEOUS | Status: DC

## 2014-10-13 MED ORDER — PHENOL 1.4 % MT LIQD: 1.0000 | OROMUCOSAL | Status: DC | PRN

## 2014-10-13 MED ORDER — METFORMIN HCL 500 MG PO TABS
1000.0000 mg | ORAL_TABLET | Freq: Two times a day (BID) | ORAL | Status: DC
Start: 2014-10-13 — End: 2014-10-16
  Administered 2014-10-13 – 2014-10-16 (×6): 1000 mg via ORAL
  Filled 2014-10-13 (×9): qty 2

## 2014-10-13 MED ORDER — PROPOFOL INFUSION 10 MG/ML OPTIME
INTRAVENOUS | Status: DC | PRN
  Administered 2014-10-13: 25 ug/kg/min via INTRAVENOUS

## 2014-10-13 MED ORDER — SODIUM CHLORIDE 0.9 % IR SOLN
Status: DC | PRN
  Administered 2014-10-13: 3000 mL

## 2014-10-13 MED ORDER — ONDANSETRON HCL 4 MG/2ML IJ SOLN
INTRAMUSCULAR | Status: AC
  Filled 2014-10-13: qty 2

## 2014-10-13 MED ORDER — CEFAZOLIN SODIUM-DEXTROSE 2-3 GM-% IV SOLR
2.0000 g | Freq: Four times a day (QID) | INTRAVENOUS | Status: AC
  Administered 2014-10-13 – 2014-10-14 (×2): 2 g via INTRAVENOUS
  Filled 2014-10-13 (×2): qty 50

## 2014-10-13 MED ORDER — CITALOPRAM HYDROBROMIDE 40 MG PO TABS
40.0000 mg | ORAL_TABLET | Freq: Every morning | ORAL | Status: DC
Start: 2014-10-14 — End: 2014-10-16
  Administered 2014-10-14 – 2014-10-16 (×3): 40 mg via ORAL
  Filled 2014-10-13 (×3): qty 1

## 2014-10-13 MED ORDER — PANTOPRAZOLE SODIUM 40 MG PO TBEC
80.0000 mg | DELAYED_RELEASE_TABLET | Freq: Every day | ORAL | Status: DC
  Administered 2014-10-14 – 2014-10-16 (×3): 80 mg via ORAL
  Filled 2014-10-13 (×3): qty 2

## 2014-10-13 MED ORDER — FENTANYL CITRATE 0.05 MG/ML IJ SOLN
INTRAMUSCULAR | Status: AC
  Filled 2014-10-13: qty 5

## 2014-10-13 MED ORDER — FENTANYL CITRATE 0.05 MG/ML IJ SOLN
INTRAMUSCULAR | Status: AC
  Filled 2014-10-13: qty 2

## 2014-10-13 MED ORDER — METHOCARBAMOL 500 MG PO TABS
500.0000 mg | ORAL_TABLET | Freq: Four times a day (QID) | ORAL | Status: DC | PRN
  Administered 2014-10-13 – 2014-10-16 (×10): 500 mg via ORAL
  Filled 2014-10-13 (×9): qty 1

## 2014-10-13 MED ORDER — ACETAMINOPHEN 650 MG RE SUPP: 650.0000 mg | Freq: Four times a day (QID) | RECTAL | Status: DC | PRN

## 2014-10-13 MED ORDER — AMLODIPINE BESYLATE 10 MG PO TABS
10.0000 mg | ORAL_TABLET | Freq: Every day | ORAL | Status: DC
  Administered 2014-10-14: 10 mg via ORAL
  Filled 2014-10-13 (×3): qty 1

## 2014-10-13 MED ORDER — METOCLOPRAMIDE HCL 5 MG/ML IJ SOLN: 5.0000 mg | Freq: Three times a day (TID) | INTRAMUSCULAR | Status: DC | PRN

## 2014-10-13 MED ORDER — MIDAZOLAM HCL 5 MG/5ML IJ SOLN
INTRAMUSCULAR | Status: DC | PRN
  Administered 2014-10-13: 2 mg via INTRAVENOUS

## 2014-10-13 MED ORDER — BUPIVACAINE HCL (PF) 0.75 % IJ SOLN
INTRAMUSCULAR | Status: DC | PRN
  Administered 2014-10-13: 1.8 mL

## 2014-10-13 MED ORDER — TRANEXAMIC ACID 100 MG/ML IV SOLN
1000.0000 mg | INTRAVENOUS | Status: AC
  Administered 2014-10-13: 1000 mg via INTRAVENOUS
  Filled 2014-10-13: qty 10

## 2014-10-13 MED ORDER — OXYCODONE HCL 5 MG PO TABS
5.0000 mg | ORAL_TABLET | ORAL | Status: DC | PRN
  Administered 2014-10-13: 10 mg via ORAL
  Administered 2014-10-13: 5 mg via ORAL
  Administered 2014-10-13 – 2014-10-14 (×3): 10 mg via ORAL
  Filled 2014-10-13 (×4): qty 2

## 2014-10-13 MED ORDER — ONDANSETRON HCL 4 MG/2ML IJ SOLN: 4.0000 mg | Freq: Once | INTRAMUSCULAR | Status: DC | PRN

## 2014-10-13 MED ORDER — SODIUM CHLORIDE 0.9 % IJ SOLN
INTRAMUSCULAR | Status: DC | PRN
  Administered 2014-10-13: 40 mL via INTRAVENOUS

## 2014-10-13 MED ORDER — BUPIVACAINE LIPOSOME 1.3 % IJ SUSP
20.0000 mL | INTRAMUSCULAR | Status: AC
  Administered 2014-10-13: 20 mL
  Filled 2014-10-13: qty 20

## 2014-10-13 MED ORDER — TRAMADOL HCL 50 MG PO TABS
100.0000 mg | ORAL_TABLET | Freq: Four times a day (QID) | ORAL | Status: DC | PRN
  Administered 2014-10-15: 100 mg via ORAL
  Filled 2014-10-13: qty 2

## 2014-10-13 MED ORDER — RIVAROXABAN 10 MG PO TABS
10.0000 mg | ORAL_TABLET | Freq: Every day | ORAL | Status: DC
Start: 2014-10-14 — End: 2014-10-16
  Administered 2014-10-14 – 2014-10-16 (×3): 10 mg via ORAL
  Filled 2014-10-13 (×4): qty 1

## 2014-10-13 MED ORDER — METHOCARBAMOL 500 MG PO TABS
ORAL_TABLET | ORAL | Status: AC
  Filled 2014-10-13: qty 1

## 2014-10-13 MED ORDER — CALCIUM CARBONATE ANTACID 500 MG PO CHEW
2.0000 | CHEWABLE_TABLET | Freq: Every day | ORAL | Status: DC | PRN
  Filled 2014-10-13: qty 2

## 2014-10-13 MED ORDER — FENTANYL CITRATE 0.05 MG/ML IJ SOLN
25.0000 ug | INTRAMUSCULAR | Status: DC | PRN
  Administered 2014-10-13: 25 ug via INTRAVENOUS
  Administered 2014-10-13: 50 ug via INTRAVENOUS
  Administered 2014-10-13 (×2): 25 ug via INTRAVENOUS

## 2014-10-13 MED ORDER — SODIUM CHLORIDE 0.9 % IV SOLN
INTRAVENOUS | Status: DC
  Administered 2014-10-13: 21:00:00 via INTRAVENOUS

## 2014-10-13 MED ORDER — ONDANSETRON HCL 4 MG PO TABS: 4.0000 mg | ORAL_TABLET | Freq: Four times a day (QID) | ORAL | Status: DC | PRN

## 2014-10-13 MED ORDER — LIDOCAINE HCL (CARDIAC) 20 MG/ML IV SOLN
INTRAVENOUS | Status: DC | PRN
  Administered 2014-10-13: 40 mg via INTRAVENOUS

## 2014-10-13 MED ORDER — MIDAZOLAM HCL 2 MG/2ML IJ SOLN
INTRAMUSCULAR | Status: AC
  Filled 2014-10-13: qty 2

## 2014-10-13 MED ORDER — LACTATED RINGERS IV SOLN
INTRAVENOUS | Status: DC
  Administered 2014-10-13: 11:00:00 via INTRAVENOUS

## 2014-10-13 MED ORDER — DIPHENHYDRAMINE HCL 12.5 MG/5ML PO ELIX: 12.5000 mg | ORAL_SOLUTION | ORAL | Status: DC | PRN

## 2014-10-13 MED ORDER — ACETAMINOPHEN 160 MG/5ML PO SOLN
325.0000 mg | ORAL | Status: DC | PRN
  Filled 2014-10-13: qty 20.3

## 2014-10-13 MED ORDER — METHOCARBAMOL 1000 MG/10ML IJ SOLN
500.0000 mg | Freq: Four times a day (QID) | INTRAMUSCULAR | Status: DC | PRN
  Filled 2014-10-13: qty 5

## 2014-10-13 MED ORDER — ACETAMINOPHEN 325 MG PO TABS: 325.0000 mg | ORAL_TABLET | ORAL | Status: DC | PRN

## 2014-10-13 MED ORDER — LACTATED RINGERS IV SOLN
INTRAVENOUS | Status: DC | PRN
  Administered 2014-10-13 (×2): via INTRAVENOUS

## 2014-10-13 MED ORDER — LIDOCAINE HCL (CARDIAC) 20 MG/ML IV SOLN
INTRAVENOUS | Status: AC
  Filled 2014-10-13: qty 5

## 2014-10-13 MED ORDER — ROCURONIUM BROMIDE 50 MG/5ML IV SOLN
INTRAVENOUS | Status: AC
  Filled 2014-10-13: qty 1

## 2014-10-13 MED ORDER — FENTANYL CITRATE 0.05 MG/ML IJ SOLN
INTRAMUSCULAR | Status: AC
Start: 2014-10-13 — End: 2014-10-14
  Filled 2014-10-13: qty 2

## 2014-10-13 MED ORDER — PRAVASTATIN SODIUM 20 MG PO TABS
20.0000 mg | ORAL_TABLET | Freq: Every day | ORAL | Status: DC
  Administered 2014-10-13 – 2014-10-15 (×3): 20 mg via ORAL
  Filled 2014-10-13 (×4): qty 1

## 2014-10-13 MED ORDER — MENTHOL 3 MG MT LOZG: 1.0000 | LOZENGE | OROMUCOSAL | Status: DC | PRN

## 2014-10-13 MED ORDER — OXYCODONE HCL 5 MG PO TABS
ORAL_TABLET | ORAL | Status: AC
  Filled 2014-10-13: qty 1

## 2014-10-13 MED ORDER — ONDANSETRON HCL 4 MG/2ML IJ SOLN
INTRAMUSCULAR | Status: DC | PRN
  Administered 2014-10-13: 4 mg via INTRAVENOUS

## 2014-10-13 MED ORDER — INSULIN ASPART 100 UNIT/ML ~~LOC~~ SOLN
0.0000 [IU] | Freq: Three times a day (TID) | SUBCUTANEOUS | Status: DC
  Administered 2014-10-14 – 2014-10-15 (×4): 3 [IU] via SUBCUTANEOUS
  Administered 2014-10-15 (×2): 2 [IU] via SUBCUTANEOUS

## 2014-10-13 SURGICAL SUPPLY — 69 items
BANDAGE ELASTIC 6 VELCRO ST LF (GAUZE/BANDAGES/DRESSINGS) ×2 IMPLANT
BANDAGE ESMARK 6X9 LF (GAUZE/BANDAGES/DRESSINGS) ×1 IMPLANT
BLADE SAG 18X100X1.27 (BLADE) ×2 IMPLANT
BNDG ESMARK 6X9 LF (GAUZE/BANDAGES/DRESSINGS) ×2
BOWL SMART MIX CTS (DISPOSABLE) IMPLANT
CAPT KNEE TOTAL 3 ×2 IMPLANT
CEMENT BONE SIMPLEX SPEEDSET (Cement) ×4 IMPLANT
COVER SURGICAL LIGHT HANDLE (MISCELLANEOUS) ×2 IMPLANT
CUFF TOURNIQUET SINGLE 34IN LL (TOURNIQUET CUFF) ×2 IMPLANT
DRAPE IMP U-DRAPE 54X76 (DRAPES) ×2 IMPLANT
DRAPE INCISE IOBAN 66X45 STRL (DRAPES) ×2 IMPLANT
DRAPE ORTHO SPLIT 77X108 STRL (DRAPES) ×2
DRAPE PROXIMA HALF (DRAPES) ×2 IMPLANT
DRAPE SURG ORHT 6 SPLT 77X108 (DRAPES) ×2 IMPLANT
DRAPE U-SHAPE 47X51 STRL (DRAPES) ×2 IMPLANT
DRSG PAD ABDOMINAL 8X10 ST (GAUZE/BANDAGES/DRESSINGS) ×2 IMPLANT
DURAPREP 26ML APPLICATOR (WOUND CARE) ×2 IMPLANT
ELECT REM PT RETURN 9FT ADLT (ELECTROSURGICAL) ×2
ELECTRODE REM PT RTRN 9FT ADLT (ELECTROSURGICAL) ×1 IMPLANT
EVACUATOR 1/8 PVC DRAIN (DRAIN) ×2 IMPLANT
FACESHIELD WRAPAROUND (MASK) ×4 IMPLANT
GAUZE SPONGE 4X4 12PLY STRL (GAUZE/BANDAGES/DRESSINGS) ×2 IMPLANT
GAUZE XEROFORM 1X8 LF (GAUZE/BANDAGES/DRESSINGS) ×2 IMPLANT
GLOVE BIO SURGEON STRL SZ 6.5 (GLOVE) ×2 IMPLANT
GLOVE BIO SURGEON STRL SZ8 (GLOVE) ×4 IMPLANT
GLOVE BIOGEL PI IND STRL 6.5 (GLOVE) ×3 IMPLANT
GLOVE BIOGEL PI IND STRL 8 (GLOVE) ×1 IMPLANT
GLOVE BIOGEL PI INDICATOR 6.5 (GLOVE) ×3
GLOVE BIOGEL PI INDICATOR 8 (GLOVE) ×1
GLOVE ECLIPSE 7.0 STRL STRAW (GLOVE) ×2 IMPLANT
GLOVE ORTHO TXT STRL SZ7.5 (GLOVE) ×2 IMPLANT
GLOVE SURG SS PI 6.5 STRL IVOR (GLOVE) ×4 IMPLANT
GOWN STRL REUS W/ TWL LRG LVL3 (GOWN DISPOSABLE) ×2 IMPLANT
GOWN STRL REUS W/ TWL XL LVL3 (GOWN DISPOSABLE) ×4 IMPLANT
GOWN STRL REUS W/TWL LRG LVL3 (GOWN DISPOSABLE) ×2
GOWN STRL REUS W/TWL XL LVL3 (GOWN DISPOSABLE) ×4
HANDPIECE INTERPULSE COAX TIP (DISPOSABLE)
IMMOBILIZER KNEE 22 (SOFTGOODS) ×2 IMPLANT
IMMOBILIZER KNEE 22 UNIV (SOFTGOODS) IMPLANT
KIT BASIN OR (CUSTOM PROCEDURE TRAY) ×2 IMPLANT
KIT ROOM TURNOVER OR (KITS) ×2 IMPLANT
MANIFOLD NEPTUNE II (INSTRUMENTS) ×2 IMPLANT
NS IRRIG 1000ML POUR BTL (IV SOLUTION) ×2 IMPLANT
PACK TOTAL JOINT (CUSTOM PROCEDURE TRAY) ×2 IMPLANT
PACK UNIVERSAL I (CUSTOM PROCEDURE TRAY) ×2 IMPLANT
PAD ABD 8X10 STRL (GAUZE/BANDAGES/DRESSINGS) ×2 IMPLANT
PAD ARMBOARD 7.5X6 YLW CONV (MISCELLANEOUS) ×4 IMPLANT
PAD CAST 4YDX4 CTTN HI CHSV (CAST SUPPLIES) ×1 IMPLANT
PADDING CAST COTTON 4X4 STRL (CAST SUPPLIES) ×1
PADDING CAST COTTON 6X4 STRL (CAST SUPPLIES) ×2 IMPLANT
SET HNDPC FAN SPRY TIP SCT (DISPOSABLE) IMPLANT
SET PAD KNEE POSITIONER (MISCELLANEOUS) ×2 IMPLANT
SPONGE GAUZE 4X4 12PLY STER LF (GAUZE/BANDAGES/DRESSINGS) ×2 IMPLANT
STAPLER VISISTAT 35W (STAPLE) IMPLANT
STRIP CLOSURE SKIN 1/2X4 (GAUZE/BANDAGES/DRESSINGS) ×4 IMPLANT
SUCTION FRAZIER TIP 10 FR DISP (SUCTIONS) ×2 IMPLANT
SUT MNCRL AB 4-0 PS2 18 (SUTURE) ×2 IMPLANT
SUT VIC AB 0 CT1 27 (SUTURE) ×1
SUT VIC AB 0 CT1 27XBRD ANBCTR (SUTURE) ×1 IMPLANT
SUT VIC AB 1 CT1 27 (SUTURE) ×2
SUT VIC AB 1 CT1 27XBRD ANBCTR (SUTURE) ×2 IMPLANT
SUT VIC AB 2-0 CT1 27 (SUTURE) ×2
SUT VIC AB 2-0 CT1 TAPERPNT 27 (SUTURE) ×2 IMPLANT
SUT VIC AB 4-0 PS2 27 (SUTURE) ×2 IMPLANT
TOWEL OR 17X24 6PK STRL BLUE (TOWEL DISPOSABLE) ×2 IMPLANT
TOWEL OR 17X26 10 PK STRL BLUE (TOWEL DISPOSABLE) ×2 IMPLANT
TRAY FOLEY CATH 16FRSI W/METER (SET/KITS/TRAYS/PACK) ×2 IMPLANT
WATER STERILE IRR 1000ML POUR (IV SOLUTION) ×4 IMPLANT
WRAP KNEE MAXI GEL POST OP (GAUZE/BANDAGES/DRESSINGS) ×2 IMPLANT

## 2014-10-13 NOTE — Progress Notes (Signed)
Orthopedic Tech Progress Note Patient Details:  Debra Wright 03-08-1951 [id number] Applied CPM to LLE.  Applied OHF with trapeze to pt.'s bed. CPM Left Knee CPM Left Knee: On Left Knee Flexion (Degrees): 60 Left Knee Extension (Degrees): 0 Additional Comments: on at [place], Debra Wright L 10/13/2014, 5:35 PM

## 2014-10-13 NOTE — Anesthesia Procedure Notes (Addendum)
Procedure Name: MAC Date/Time: 10/13/2014 12:40 PM Performed by: Garrison Columbus T Pre-anesthesia Checklist: Patient identified, Emergency Drugs available, Suction available and Patient being monitored Patient Re-evaluated:Patient Re-evaluated prior to inductionOxygen Delivery Method: Simple face mask Preoxygenation: Pre-oxygenation with 100% oxygen Intubation Type: IV induction Placement Confirmation: positive ETCO2 and breath sounds checked- equal and bilateral Dental Injury: Teeth and Oropharynx as per pre-operative assessment    Spinal Patient location during procedure: OR Start time: 10/13/2014 12:30 PM End time: 10/13/2014 12:35 PM Staffing Anesthesiologist: Milana Obey Performed by: anesthesiologist  Preanesthetic Checklist Completed: patient identified, site marked, surgical consent, pre-op evaluation, timeout performed, IV checked, risks and benefits discussed and monitors and equipment checked Spinal Block Patient position: sitting Prep: Betadine Patient monitoring: heart rate, continuous pulse ox and blood pressure Location: L3-4 Injection technique: single-shot Needle Needle type: Quincke  Needle gauge: 22 G Needle length: 9 cm Additional Notes Expiration date of kit checked and confirmed. Patient tolerated procedure well, without complications.  Functioning IV was confirmed and monitors were applied. Sterile prep and drape, including hand hygiene, mask and sterile gloves were used. The patient was positioned and the spine was prepped. The skin was anesthetized with lidocaine.  Free flow of clear CSF was obtained prior to injecting local anesthetic into the CSF.  The spinal needle aspirated freely following injection.  The needle was carefully withdrawn.  The patient tolerated the procedure well. Consent was obtained prior to procedure with all questions answered and concerns addressed. Risks including but not limited to bleeding, infection, nerve damage, paralysis,  failed block, inadequate analgesia, allergic reaction, high spinal, itching and headache were discussed and the patient wished to proceed.   Lauretta Grill, MD

## 2014-10-13 NOTE — Brief Op Note (Signed)
10/13/2014  2:47 PM  PATIENT:  Debra Wright  64 y.o. female  PRE-OPERATIVE DIAGNOSIS:  Endstage tricompartmental arthritis left knee  POST-OPERATIVE DIAGNOSIS:  Endstage tricompartmental arthritis left knee  PROCEDURE:  Procedure(s): LEFT TOTAL KNEE ARTHROPLASTY (Left)  SURGEON:  Surgeon(s) and Role:    * Mcarthur Rossetti, MD - Primary  PHYSICIAN ASSISTANT: Benita Stabile, PA-C  ANESTHESIA:   local and spinal  EBL:  Total I/O In: 1300 [I.V.:1300] Out: 150 [Blood:150]  BLOOD ADMINISTERED:none  DRAINS: none   LOCAL MEDICATIONS USED:  OTHER Experil  SPECIMEN:  No Specimen  DISPOSITION OF SPECIMEN:  N/A  COUNTS:  YES  TOURNIQUET:   Total Tourniquet Time Documented: Thigh (Left) - 79 minutes Total: Thigh (Left) - 79 minutes   DICTATION: .Other Dictation: Dictation Number [id number]  PLAN OF CARE: Admit to inpatient   PATIENT DISPOSITION:  PACU - hemodynamically stable.   Delay start of Pharmacological VTE agent (>24hrs) due to surgical blood loss or risk of bleeding: no

## 2014-10-13 NOTE — Transfer of Care (Signed)
Immediate Anesthesia Transfer of Care Note  Patient: Debra Wright  Procedure(s) Performed: Procedure(s): LEFT TOTAL KNEE ARTHROPLASTY (Left)  Patient Location: PACU  Anesthesia Type:MAC and Spinal  Level of Consciousness: awake, alert  and oriented  Airway & Oxygen Therapy: Patient Spontanous Breathing and Patient connected to nasal cannula oxygen  Post-op Assessment: Report given to PACU RN, Post -op Vital signs reviewed and stable and Patient moving all extremities X 4  Post vital signs: Reviewed and stable  Complications: No apparent anesthesia complications

## 2014-10-13 NOTE — Anesthesia Postprocedure Evaluation (Signed)
  Anesthesia Post-op Note  Patient: Debra Wright  Procedure(s) Performed: Procedure(s): LEFT TOTAL KNEE ARTHROPLASTY (Left)  Patient Location: PACU  Anesthesia Type:Spinal  Level of Consciousness: awake  Airway and Oxygen Therapy: Patient Spontanous Breathing  Post-op Pain: mild  Post-op Assessment: Post-op Vital signs reviewed, Patient's Cardiovascular Status Stable, Respiratory Function Stable, Patent Airway, No signs of Nausea or vomiting and Pain level controlled  Post-op Vital Signs: Reviewed and stable  Last Vitals:  Filed Vitals:   10/13/14 1612  BP:   Pulse: 65  Temp:   Resp: 14    Complications: No apparent anesthesia complications

## 2014-10-13 NOTE — H&P (Signed)
TOTAL KNEE ADMISSION H&P  Patient is being admitted for left total knee arthroplasty.  Subjective:  Chief Complaint:left knee pain.  HPI: Debra Wright, 63 y.o. female, has a history of pain and functional disability in the left knee due to arthritis and has failed non-surgical conservative treatments for greater than 12 weeks to includeNSAID's and/or analgesics, corticosteriod injections, flexibility and strengthening excercises, use of assistive devices, weight reduction as appropriate and activity modification.  Onset of symptoms was gradual, starting 5 years ago with gradually worsening course since that time. The patient noted no past surgery on the left knee(s).  Patient currently rates pain in the left knee(s) at 10 out of 10 with activity. Patient has night pain, worsening of pain with activity and weight bearing, pain that interferes with activities of daily living, pain with passive range of motion, crepitus and joint swelling.  Patient has evidence of subchondral cysts, subchondral sclerosis, periarticular osteophytes and joint space narrowing by imaging studies. There is no active infection.  Patient Active Problem List   Diagnosis Date Noted  . Arthritis of left knee 10/13/2014   Past Medical History  Diagnosis Date  . Diabetes mellitus without complication   . Hypertension   . Arthritis   . Depression   . Anxiety   . GERD (gastroesophageal reflux disease)   . Cancer     skin    Past Surgical History  Procedure Laterality Date  . Abdominal hysterectomy    . Carpal tunnel release Bilateral 86  . Bilateral oophorectomy  04  . Eye surgery Bilateral 11    lens cataracts  . Shoulder arthroscopy distal clavicle excision and open rotator cuff repair Right 3/15    Prescriptions prior to admission  Medication Sig Dispense Refill Last Dose  . amLODipine (NORVASC) 10 MG tablet Take 10 mg by mouth daily.   10/13/2014 at 0830  . aspirin 325 MG EC tablet Take 325 mg by mouth every  morning.   Past Month at Unknown time  . aspirin 81 MG tablet Take 81 mg by mouth daily.   Past Month at Unknown time  . Biotin 5000 MCG TABS Take 5,000 mcg by mouth daily.   10/12/2014 at Unknown time  . calcium carbonate (TUMS - DOSED IN MG ELEMENTAL CALCIUM) 500 MG chewable tablet Chew 2 tablets by mouth daily as needed for indigestion or heartburn.   10/12/2014 at Unknown time  . Calcium Carbonate-Vitamin D (CALCIUM 600 + D PO) Take 1 tablet by mouth daily.   10/12/2014 at Unknown time  . citalopram (CELEXA) 40 MG tablet Take 40 mg by mouth every morning.   10/13/2014 at 0830  . diazepam (VALIUM) 5 MG tablet Take 1 tablet (5 mg total) by mouth every 6 (six) hours as needed (dizziness). 10 tablet 0 Past Week at Unknown time  . eszopiclone (LUNESTA) 2 MG TABS tablet Take 2 mg by mouth at bedtime as needed for sleep. Take immediately before bedtime   10/12/2014 at Unknown time  . ibuprofen (ADVIL,MOTRIN) 200 MG tablet Take 400 mg by mouth every 6 (six) hours as needed.   Past Week at Unknown time  . metFORMIN (GLUCOPHAGE) 1000 MG tablet Take 1,000 mg by mouth 2 (two) times daily with a meal.    10/12/2014 at Unknown time  . omega-3 acid ethyl esters (LOVAZA) 1 G capsule Take 2 g by mouth 2 (two) times daily.   10/12/2014 at Unknown time  . omeprazole (PRILOSEC) 40 MG capsule Take 40 mg by mouth  every morning.   10/13/2014 at 0830  . pravastatin (PRAVACHOL) 20 MG tablet Take 20 mg by mouth at bedtime.   10/12/2014 at Unknown time  . traMADol (ULTRAM) 50 MG tablet Take 100 mg by mouth every 6 (six) hours as needed for moderate pain.   10/12/2014 at Unknown time   Allergies  Allergen Reactions  . Antivert [Meclizine Hcl]     "makes me feel like my skin is falling off".    History  Substance Use Topics  . Smoking status: Never Smoker   . Smokeless tobacco: Not on file  . Alcohol Use: Yes     Comment: occ    History reviewed. No pertinent family history.   Review of Systems  Musculoskeletal: Positive  for joint pain.  All other systems reviewed and are negative.   Objective:  Physical Exam  Constitutional: She is oriented to person, place, and time. She appears well-developed and well-nourished.  HENT:  Head: Normocephalic and atraumatic.  Eyes: EOM are normal. Pupils are equal, round, and reactive to light.  Neck: Normal range of motion. Neck supple.  Cardiovascular: Normal rate and regular rhythm.   Respiratory: Effort normal and breath sounds normal.  GI: Soft. Bowel sounds are normal.  Musculoskeletal:       Left knee: She exhibits decreased range of motion, effusion and abnormal alignment. Tenderness found. Medial joint line and lateral joint line tenderness noted.  Neurological: She is alert and oriented to person, place, and time.  Skin: Skin is warm and dry.  Psychiatric: She has a normal mood and affect.    Vital signs in last 24 hours: Temp:  [99 F (37.2 C)] 99 F (37.2 C) (01/12 1102) Pulse Rate:  [65] 65 (01/12 1102) Resp:  [18] 18 (01/12 1102) BP: (125)/(46) 125/46 mmHg (01/12 1102) SpO2:  [98 %] 98 % (01/12 1102) Weight:  [99.791 kg (220 lb)] 99.791 kg (220 lb) (01/12 1102)  Labs:   Estimated body mass index is 38.98 kg/(m^2) as calculated from the following:   Height as of this encounter: 5\' 3"  (1.6 m).   Weight as of this encounter: 99.791 kg (220 lb).   Imaging Review Plain radiographs demonstrate severe degenerative joint disease of the left knee(s). The overall alignment issignificant varus. The bone quality appears to be good for age and reported activity level.  Assessment/Plan:  End stage arthritis, left knee   The patient history, physical examination, clinical judgment of the provider and imaging studies are consistent with end stage degenerative joint disease of the left knee(s) and total knee arthroplasty is deemed medically necessary. The treatment options including medical management, injection therapy arthroscopy and arthroplasty were  discussed at length. The risks and benefits of total knee arthroplasty were presented and reviewed. The risks due to aseptic loosening, infection, stiffness, patella tracking problems, thromboembolic complications and other imponderables were discussed. The patient acknowledged the explanation, agreed to proceed with the plan and consent was signed. Patient is being admitted for inpatient treatment for surgery, pain control, PT, OT, prophylactic antibiotics, VTE prophylaxis, progressive ambulation and ADL's and discharge planning. The patient is planning to be discharged home with home health services

## 2014-10-13 NOTE — Plan of Care (Signed)
Problem: Consults Goal: Diagnosis- Total Joint Replacement Primary Total Knee Left     

## 2014-10-13 NOTE — Progress Notes (Signed)
Utilization review completed.  

## 2014-10-14 ENCOUNTER — Encounter (HOSPITAL_COMMUNITY): Payer: Self-pay | Admitting: Orthopaedic Surgery

## 2014-10-14 LAB — BASIC METABOLIC PANEL
ANION GAP: 8 (ref 5–15)
BUN: 6 mg/dL (ref 6–23)
CHLORIDE: 92 meq/L — AB (ref 96–112)
CO2: 30 mmol/L (ref 19–32)
CREATININE: 0.62 mg/dL (ref 0.50–1.10)
Calcium: 8 mg/dL — ABNORMAL LOW (ref 8.4–10.5)
GFR calc Af Amer: >90 mL/min (ref >90)
GFR calc non Af Amer: >90 mL/min (ref >90)
Glucose, Bld: 175 mg/dL — ABNORMAL HIGH (ref 70–99)
POTASSIUM: 4.1 mmol/L (ref 3.5–5.1)
Sodium: 130 mmol/L — ABNORMAL LOW (ref 135–145)

## 2014-10-14 LAB — GLUCOSE, CAPILLARY
Glucose-Capillary: 157 mg/dL — ABNORMAL HIGH (ref 70–99)
Glucose-Capillary: 168 mg/dL — ABNORMAL HIGH (ref 70–99)
Glucose-Capillary: 154 mg/dL — ABNORMAL HIGH (ref 70–99)
Glucose-Capillary: 165 mg/dL — ABNORMAL HIGH (ref 70–99)

## 2014-10-14 LAB — CBC
HCT: 32.9 % — ABNORMAL LOW (ref 36.0–46.0)
Hemoglobin: 10.4 g/dL — ABNORMAL LOW (ref 12.0–15.0)
MCH: 24.9 pg — AB (ref 26.0–34.0)
MCHC: 31.6 g/dL (ref 30.0–36.0)
MCV: 78.9 fL (ref 78.0–100.0)
Platelets: 215 10*3/uL (ref 150–400)
RBC: 4.17 MIL/uL (ref 3.87–5.11)
RDW: 14.6 % (ref 11.5–15.5)
WBC: 9.9 10*3/uL (ref 4.0–10.5)

## 2014-10-14 MED ORDER — OXYCODONE HCL 5 MG PO TABS
5.0000 mg | ORAL_TABLET | ORAL | Status: DC | PRN
  Administered 2014-10-14 – 2014-10-15 (×5): 15 mg via ORAL
  Administered 2014-10-15: 10 mg via ORAL
  Filled 2014-10-14 (×6): qty 3

## 2014-10-14 MED ORDER — KETOROLAC TROMETHAMINE 15 MG/ML IJ SOLN
15.0000 mg | Freq: Three times a day (TID) | INTRAMUSCULAR | Status: AC | PRN
  Administered 2014-10-14 – 2014-10-16 (×3): 15 mg via INTRAVENOUS
  Filled 2014-10-14 (×3): qty 1

## 2014-10-14 NOTE — Op Note (Signed)
NAMEMIKESHA, MIGLIACCIO NO.:  [id number]  MEDICAL RECORD NO.:  [id number]  LOCATION:  5N01C                        FACILITY:  [facility]  PHYSICIAN:  Lind Guest. Ninfa Linden, M.D.DATE OF BIRTH:  1951/03/30  DATE OF PROCEDURE:  10/13/2014 DATE OF DISCHARGE:                              OPERATIVE REPORT   PREOPERATIVE DIAGNOSIS:  Primary osteoarthritis and degenerative joint disease, left knee.  POSTOPERATIVE DIAGNOSIS:  Primary osteoarthritis and degenerative joint disease, left knee.  PROCEDURE:  Left total knee arthroplasty.  IMPLANTS:  [employer] Primary Knee with size 4 Oxinium femur, size 3 tibial tray, 11 mm constrained polyethylene insert, size 29 patellar button.  SURGEON:  Lind Guest. Ninfa Linden, M.D.  ASSISTANT:  Erskine Emery, P.A.  ANESTHESIA:  Spinal.  ANTIBIOTICS:  Ancef 2 g IV.  ESTIMATED BLOOD LOSS:  150 mL.  LOCAL:  Exparel.  COMPLICATIONS:  None.  INDICATIONS:  Ms. Lapine is a very pleasant 64 year old female whose mobility has been severely limited by significant valgus deformities of both of her knees.  She has known severe primary osteoarthritis of these knees.  We have tried multiple injections and other conservative treatment modalities to try to get her knees feeling better.  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  She wished to proceed with a total knee arthroplasty.  She understands the risk of acute blood loss anemia, nerve and vessel injury, fracture, infection, dislocation, DVT.  She understands the goals are to decrease pain, improved mobility, and overall improved quality of life.  PROCEDURE DESCRIPTION:  After informed consent was obtained, appropriate left knee was marked.  She was brought to the operating room and spinal anesthesia was obtained.  She was then laid in  the supine position.  A Foley catheter was placed and then a nonsterile tourniquet was placed around her upper left thigh.  Her left leg was prepped and draped then from the thigh down the toes with DuraPrep and sterile drapes.  Sterile stockinette was also used.  A time-out was called and she was identified as correct patient, correct left knee.  We then used an Esmarch to wrap out the leg and the tourniquet was inflated to 300 mm of pressure.  We then noted that both legs significantly externally rotated with significant varus deformity of both knees.  I then made a midline incision directly over the patella and carried this proximally and distally.  We dissected down the knee joint.  I carried out a medial parapatellar arthrotomy and found a significantly large joint effusion. We then irrigated the knee and removed significant osteophytes throughout the femur and the tibia as well as the patella with the knee in a flexed position.  We then put our extramedullary cutting guide on the tibia and we set for correcting varus, valgus, and a neutral slope, and took 9 mm off the distal tibia cut.  We then used the intramedullary guide of the femur and set our femoral cut for this femur of 9.5 mm at 5 degrees  externally rotated.  We then made this cut, brought the knee back down in extension with a 9 mm extension.  We achieved full extension, maybe even a little hyperextension given her flexion contracture preoperative.  We then went back to the femur, put on the femoral sizing guide based off the epicondylar axis and Whitesides line. We then chose a size 4 femur.  We put the 4-in-1 cutting block on and made our anterior and posterior cuts followed by our chamfer cuts.  We then made our femoral box cut.  We then went back to the tibia and set our rotation based off the femur and tibial tubercle and we were able to put a size 3 tibial tray.  We did our drill and keel punch for this and then I  went to do an 11 mm constrained polyethylene insert given her varus deformity and her obesity, and I was pleased with the range of motion, stability of this.  We then measured for a size 29 patellar button and made this cut, taking about 7 mm off the undersurface of the patella.  We had had all trial components in place, and I was pleased with stability and range of motion.  We then removed all trial components and we injected Exparel around the arthrotomy while cement was being mixed.  Once the cement was mixed, we cemented the real Tamala Julian and [employer] Primary tibial tray size 3, we cemented the real size 4 femur, we placed the real 11 mm constrained polyethylene insert and cemented the patellar button.  Once the cement had hardened and dried, we let the tourniquet down.  Hemostasis obtained with electrocautery. We irrigated the knee with normal saline solution using pulsatile lavage and then closed the arthrotomy with interrupted #1 Vicryl suture followed by 0 Vicryl in the deep tissue, 2-0 Vicryl in the subcutaneous tissue, 4-0 Monocryl subcuticular suture, and Steri-Strips.  Well-padded sterile dressing was applied and then she was taken to the recovery room in stable condition.  All final counts were correct.  There were no complications noted.  Of note, Erskine Emery, PA-C's assistance was crucial throughout this whole case given the patient's size and difficulty of the case.  He helped with all facets and aspects.     Lind Guest. Ninfa Linden, M.D.     CYB/MEDQ  D:  10/13/2014  T:  10/14/2014  Job:  [id number]

## 2014-10-14 NOTE — Progress Notes (Signed)
CARE MANAGEMENT NOTE 10/14/2014  Patient:  Debra Wright, Debra Wright   Account Number:  [id number]  Date Initiated:  10/14/2014  Documentation initiated by:  Fuller Plan  Subjective/Objective Assessment:   s/p left TKA     Action/Plan:   PT/OT evals-recommended HHPT   Anticipated DC Date:  10/15/2014   Anticipated DC Plan:  [place] [facility]  CM consult      [facility] Choice  [place]   Choice offered to / List presented to:  C-1 Patient   DME arranged  3-N-1  Vassie Moselle      DME agency  [place] arranged  [place].   Status of service:   Medicare Important Message given?   (If response is "NO", the following Medicare IM given date fields will be blank) Date Medicare IM given:   Medicare IM given by:   Date Additional Medicare IM given:   Additional Medicare IM given by:    Discharge Disposition:  [place]  Per UR Regulation:  Reviewed for med. necessity/level of care/duration of stay  If discussed at [place] of Stay Meetings, dates discussed:    Comments:  10/14/14 Set up with  Leonard Downing by MD office. Patient selected [facility]. Spoke with Stanton Kidney at [facility] about switch of agencies. Contacted Miranda at [facility] and set up [place]. Contacted Frank with [facility] and requested rolling walker and 3N1 be delivered to patient's room. Fuller Plan RN, BSN, CCM

## 2014-10-14 NOTE — Progress Notes (Signed)
Physical Therapy Treatment Patient Details Name: Debra Wright MRN: [id number] DOB: 1951/07/15 Today's Date: 10/14/2014    History of Present Illness Pt is a 64 y.o. female s/p Lt TKA.     PT Comments    Pt slowly progressing with PT and mobility. Pt remains motivated to progress to independence to return home. Husband is disabled but pt reports multiple family members coming to stay with them upon D/C. Will cont to follow per POC.   Follow Up Recommendations  Home health PT;Supervision/Assistance - 24 hour     Equipment Recommendations  Rolling walker with 5" wheels;3in1 (PT)    Recommendations for Other Services       Precautions / Restrictions Precautions Precautions: Fall;Knee Precaution Comments: reviewed no pillow left under knee Required Braces or Orthoses: Knee Immobilizer - Left Knee Immobilizer - Left: On when out of bed or walking Restrictions Weight Bearing Restrictions: Yes LLE Weight Bearing: Weight bearing as tolerated    Mobility  Bed Mobility               General bed mobility comments: up in chair and returned to chair   Transfers Overall transfer level: Needs assistance Equipment used: Rolling walker (2 wheeled) Transfers: Sit to/from Stand Sit to Stand: Min guard         General transfer comment: min guard to steady and power up ; cues for technique   Ambulation/Gait Ambulation/Gait assistance: Min assist Ambulation Distance (Feet): 30 Feet Assistive device: Rolling walker (2 wheeled) Gait Pattern/deviations: Step-to pattern;Decreased stance time - left;Decreased step length - right;Antalgic;Wide base of support;Trunk flexed Gait velocity: decreased due to pain  Gait velocity interpretation: Below normal speed for age/gender General Gait Details: cues for step through gt and upright posture. pt requires 2 standing rest breaks; min (A) to manage RW    Stairs            Wheelchair Mobility    Modified Rankin (Stroke Patients  Only)       Balance Overall balance assessment: Needs assistance Sitting-balance support: Feet supported;No upper extremity supported Sitting balance-Leahy Scale: Good     Standing balance support: During functional activity;Bilateral upper extremity supported Standing balance-Leahy Scale: Poor Standing balance comment: RW to balance                     Cognition Arousal/Alertness: Awake/alert Behavior During Therapy: WFL for tasks assessed/performed Overall Cognitive Status: Within Functional Limits for tasks assessed                      Exercises Total Joint Exercises Ankle Circles/Pumps: AROM;Both;10 reps;Seated Quad Sets: AROM;Left;10 reps;Seated Heel Slides: AAROM;Left;10 reps;Seated Hip ABduction/ADduction: AAROM;Left;10 reps;Seated Long Arc Quad: AROM;Left;10 reps;Seated    General Comments General comments (skin integrity, edema, etc.): encouraged OOB with nursing for all meals and bathroom ; given HEP for exercises       Pertinent Vitals/Pain Pain Assessment: 0-10 Pain Score: 4  Pain Location: Lt knee Pain Descriptors / Indicators: Aching Pain Intervention(s): Monitored during session;Premedicated before session;Repositioned;Ice applied    Home Living                      Prior Function            PT Goals (current goals can now be found in the care plan section) Acute Rehab PT Goals Patient Stated Goal: to go home with my family  PT Goal Formulation: With patient Time For Goal Achievement: 10/21/14  Potential to Achieve Goals: Good Progress towards PT goals: Progressing toward goals    Frequency  7X/week    PT Plan Current plan remains appropriate    Co-evaluation             End of Session Equipment Utilized During Treatment: Gait belt;Left knee immobilizer Activity Tolerance: Patient limited by pain Patient left: in chair;with call bell/phone within reach     Time: 1323-1350 PT Time Calculation (min) (ACUTE  ONLY): 27 min  Charges:  $Gait Training: 8-22 mins $Therapeutic Exercise: 8-22 mins                    G CodesGustavus Bryant, [place]  [phone] 10/14/2014, 3:37 PM

## 2014-10-14 NOTE — Evaluation (Signed)
Physical Therapy Evaluation Patient Details Name: Debra Wright MRN: [id number] DOB: 05-12-1951 Today's Date: 10/14/2014   History of Present Illness  Pt is a 64 y.o. female s/p Lt TKA.   Clinical Impression  Pt is s/p Lt TKA POD#1 resulting in the deficits listed below (see PT Problem List). Pt will benefit from skilled PT to increase their independence and safety with mobility to allow discharge to the venue listed below. Pt limited to SPT to chair only this session due to pain. Encouraged OOB activity with nursing as tolerated vs bed pan to increase activity tolerance.      Follow Up Recommendations Home health PT;Supervision/Assistance - 24 hour    Equipment Recommendations  Rolling walker with 5" wheels;3in1 (PT)    Recommendations for Other Services OT consult     Precautions / Restrictions Precautions Precautions: Fall;Knee Precaution Comments: reviewed no pillow left under knee Required Braces or Orthoses: Knee Immobilizer - Left Restrictions Weight Bearing Restrictions: Yes LLE Weight Bearing: Weight bearing as tolerated      Mobility  Bed Mobility Overal bed mobility: Needs Assistance Bed Mobility: Supine to Sit     Supine to sit: Min assist;HOB elevated     General bed mobility comments: (A) to advance Lt LE to/off EOB; limited by pain   Transfers Overall transfer level: Needs assistance Equipment used: Rolling walker (2 wheeled) Transfers: Sit to/from [facility] Sit to Stand: Min assist Stand pivot transfers: Mod assist       General transfer comment: pt limited due to pain; demo difficulty WB'ing through [facility]; cues for sequencing and mod (A) to balance and manage RW with pivotal steps   Ambulation/Gait             General Gait Details: pivotal steps  Stairs            Wheelchair Mobility    Modified Rankin (Stroke Patients Only)       Balance Overall balance assessment: Needs assistance Sitting-balance support:  Feet supported;No upper extremity supported Sitting balance-Leahy Scale: Fair Sitting balance - Comments: guarded sitting EOB   Standing balance support: During functional activity;Bilateral upper extremity supported Standing balance-Leahy Scale: Poor Standing balance comment: RW and mod (A) to balance                              Pertinent Vitals/Pain Pain Assessment: 0-10 Pain Score: 6  Pain Location: lt knee  Pain Descriptors / Indicators: Aching Pain Intervention(s): Monitored during session;Premedicated before session;Repositioned;Ice applied    Home Living Family/patient expects to be discharged to:: Private residence Living Arrangements: Spouse/significant other Available Help at Discharge: Family;Available 24 hours/day Type of Home: Apartment Home Access: Ramped entrance (reports there is an "incline" to enter house )     Home Layout: One level Home Equipment: Shower seat;Cane - single point Additional Comments: pt has tub shower     Prior Function Level of Independence: Independent         Comments: ambulated with cane PRN     Hand Dominance        Extremity/Trunk Assessment   Upper Extremity Assessment: Defer to OT evaluation           Lower Extremity Assessment: LLE deficits/detail   LLE Deficits / Details: AROM 10 to 45 degrees limited by pain and ACE wrap  Cervical / Trunk Assessment: Normal  Communication   Communication: No difficulties  Cognition Arousal/Alertness: Awake/alert Behavior During Therapy: [facility]  for tasks assessed/performed Overall Cognitive Status: Within Functional Limits for tasks assessed                      General Comments General comments (skin integrity, edema, etc.): encouraged OOB with nursing to [facility] for incr activity tolerance     Exercises Total Joint Exercises Ankle Circles/Pumps: AROM;Both;10 reps;Seated Quad Sets: AROM;Left;10 reps;Seated;Other (comment) (hold 5 sec count) Long Arc Quad:  AAROM;Left;5 reps;Seated;Limitations Long [employer] Limitations: pain Knee Flexion: AAROM;Left;5 reps;Seated      Assessment/Plan    PT Assessment Patient needs continued PT services  PT Diagnosis Difficulty walking;Generalized weakness;Acute pain   PT Problem List Decreased strength;Decreased range of motion;Decreased activity tolerance;Decreased balance;Decreased mobility;Decreased knowledge of use of DME;Decreased safety awareness;Pain  PT Treatment Interventions DME instruction;Gait training;Functional mobility training;Therapeutic activities;Therapeutic exercise;Balance training;Neuromuscular re-education;Patient/family education   PT Goals (Current goals can be found in the Care Plan section) Acute Rehab PT Goals Patient Stated Goal: to not have this pain PT Goal Formulation: With patient Time For Goal Achievement: 10/21/14 Potential to Achieve Goals: Good    Frequency 7X/week   Barriers to discharge        Co-evaluation               End of Session Equipment Utilized During Treatment: Gait belt;Left knee immobilizer Activity Tolerance: Patient limited by pain Patient left: in chair;with call bell/phone within reach;with nursing/sitter in room Nurse Communication: Mobility status;Precautions;Weight bearing status         Time: 9381-8299 PT Time Calculation (min) (ACUTE ONLY): 28 min   Charges:   PT Evaluation $Initial PT Evaluation Tier I: 1 Procedure PT Treatments $Therapeutic Exercise: 8-22 mins $Therapeutic Activity: 8-22 mins   PT G CodesGustavus Bryant , [place]  [phone]  10/14/2014, 8:27 AM

## 2014-10-14 NOTE — Evaluation (Signed)
Occupational Therapy Evaluation Patient Details Name: Debra Wright MRN: [id number] DOB: Jan 02, 1951 Today's Date: 10/14/2014    History of Present Illness Pt is a 64 y.o. female s/p Lt TKA.    Clinical Impression   Pt is a 64 y/o w/ decreased ability to perform ADL and functional mobility s/p LTKA & c/o pain (see problem list below), she should benefit from acute OT to address deficits and assist in maximizing independence with ADL/functional mobility and pt/family education.      Follow Up Recommendations  No OT follow up;Supervision - Intermittent    Equipment Recommendations  3 in 1 bedside comode;Other (comment) (A/E for LB ADL's)    Recommendations for Other Services       Precautions / Restrictions Precautions Precautions: Fall;Knee Precaution Comments: reviewed no pillow left under knee Required Braces or Orthoses: Knee Immobilizer - Left Restrictions Weight Bearing Restrictions: Yes LLE Weight Bearing: Weight bearing as tolerated      Mobility Bed Mobility Overal bed mobility:  (Pt up in chair upon OT arrival) Bed Mobility: Supine to Sit     Supine to sit: Min assist;HOB elevated     General bed mobility comments: (A) to advance Lt LE to/off EOB; limited by pain   Transfers Overall transfer level: Needs assistance Equipment used: Rolling walker (2 wheeled) Transfers: Sit to/from [facility] Sit to Stand: Min assist Stand pivot transfers: Min assist       General transfer comment: pt limited due to pain; demo difficulty WB'ing through Lt LE able to take 4-5 steps toward 3:1 in room for toilet use; cues for sequencing and to manage RW with pivotal steps.    Balance Overall balance assessment: Needs assistance Sitting-balance support: Feet supported;No upper extremity supported Sitting balance-Leahy Scale: Fair Sitting balance - Comments: guarded sitting EOB   Standing balance support: Bilateral upper extremity supported Standing  balance-Leahy Scale: Fair Standing balance comment: RW & Min A for balance                            ADL Overall ADL's : Needs assistance/impaired     Grooming: Wash/dry hands;Wash/dry face;Sitting;Set up   Upper Body Bathing: Set up;Sitting   Lower Body Bathing: Minimal assistance;Sit to/from stand   Upper Body Dressing : Modified independent;Sitting;Set up   Lower Body Dressing: Minimal assistance;Sit to/from stand   Toilet Transfer: Minimal assistance;BSC;RW Toilet Transfer Details (indicate cue type and reason): Increased time for all tasks, moves slowly due to pain, in [place] - took 4-5 steps then performed SPT to 3:1 in room Toileting- Clothing Manipulation and Hygiene: Modified independent;Sitting/lateral lean   Tub/ Shower Transfer:  (TBA)   Functional mobility during ADLs: Minimal assistance;Rolling walker;Cueing for sequencing;Cueing for safety General ADL Comments: Pt was educated in role of OT. She was agreeable to ADL retraining for functional mobility and toileting tasks today. She was educated verbally that she may benefit from a/e and tub transfers as well as increased funcional mobility to assist with increased independence with ADL's. Pt spouse will be home w/ pt but is not able to assist due to back and mobility issues, may need acute rehab, cont to assess.     Vision                     Perception     Praxis      Pertinent Vitals/Pain Pain Assessment: 0-10 Pain Score: 6  Pain Location: Left knee Pain Descriptors /  Indicators: Aching Pain Intervention(s): Monitored during session;Premedicated before session;Ice applied     Hand Dominance Right   Extremity/Trunk Assessment Upper Extremity Assessment Upper Extremity Assessment: Overall WFL for tasks assessed   Lower Extremity Assessment Lower Extremity Assessment: Defer to PT evaluation LLE Deficits / Details: AROM 10 to 45 degrees limited by pain and ACE wrap LLE: Unable to fully  assess due to pain LLE Coordination: decreased gross motor   Cervical / Trunk Assessment Cervical / Trunk Assessment: Normal   Communication Communication Communication: No difficulties   Cognition Arousal/Alertness: Awake/alert Behavior During Therapy: WFL for tasks assessed/performed Overall Cognitive Status: Within Functional Limits for tasks assessed                     General Comments          Shoulder Instructions      Home Living Family/patient expects to be discharged to:: Private residence Living Arrangements: Spouse/significant other Available Help at Discharge: Family;Available 24 hours/day Type of Home: Apartment Home Access: Ramped entrance     Home Layout: One level     Bathroom Shower/Tub: Teacher, early years/pre: Standard     Home Equipment: Shower seat;Cane - single point   Additional Comments: pt has tub shower       Prior Functioning/Environment Level of Independence: Independent        Comments: ambulated with cane PRN    OT Diagnosis: Generalized weakness;Acute pain   OT Problem List: Decreased strength;Decreased activity tolerance;Impaired balance (sitting and/or standing);Decreased knowledge of use of DME or AE;Pain   OT Treatment/Interventions: Self-care/ADL training;DME and/or AE instruction;Patient/family education;Therapeutic activities;Balance training    OT Goals(Current goals can be found in the care plan section) Acute Rehab OT Goals Patient Stated Goal: Decreased pain LLE Time For Goal Achievement: 10/28/14 Potential to Achieve Goals: Good  OT Frequency: Min 2X/week   Barriers to D/C:            Co-evaluation              End of Session Equipment Utilized During Treatment: Gait belt;Rolling walker CPM Left Knee CPM Left Knee: Off Nurse Communication: Mobility status;Other (comment) (Ice left knee)  Activity Tolerance: Patient limited by pain Patient left: in chair;with call bell/phone  within reach;with family/visitor present   Time: 2229-7989 OT Time Calculation (min): 27 min Charges:  OT General Charges $OT Visit: 1 Procedure OT Evaluation $Initial OT Evaluation Tier I: 1 Procedure OT Treatments $Self Care/Home Management : 8-22 mins G-Codes:    Almyra Deforest, OTR/L 10/14/2014, 11:17 AM

## 2014-10-14 NOTE — Discharge Instructions (Addendum)
Rivaroxaban oral tablets °What is this medicine? °RIVAROXABAN (ri va ROX a ban) is an anticoagulant (blood thinner). It is used to treat blood clots in the lungs or in the veins. It is also used after knee or hip surgeries to prevent blood clots. It is also used to lower the chance of stroke in people with a medical condition called atrial fibrillation. °This medicine may be used for other purposes; ask your health care provider or pharmacist if you have questions. °COMMON BRAND NAME(S): Xarelto, Xarelto Starter Pack °What should I tell my health care provider before I take this medicine? °They need to know if you have any of these conditions: °-bleeding disorders °-bleeding in the brain °-blood in your stools (black or tarry stools) or if you have blood in your vomit °-history of stomach bleeding °-kidney disease °-liver disease °-low blood counts, like low white cell, platelet, or red cell counts °-recent or planned spinal or epidural procedure °-take medicines that treat or prevent blood clots °-an unusual or allergic reaction to rivaroxaban, other medicines, foods, dyes, or preservatives °-pregnant or trying to get pregnant °-breast-feeding °How should I use this medicine? °Take this medicine by mouth with a glass of water. Follow the directions on the prescription label. Take your medicine at regular intervals. Do not take it more often than directed. Do not stop taking except on your doctor's advice. Stopping this medicine may increase your risk of a blot clot. Be sure to refill your prescription before you run out of medicine. °If you are taking this medicine after hip or knee replacement surgery, take it with or without food. If you are taking this medicine for atrial fibrillation, take it with your evening meal. If you are taking this medicine to treat blood clots, take it with food at the same time each day. If you are unable to swallow your tablet, you may crush the tablet and mix it in applesauce. Then,  immediately eat the applesauce. You should eat more food right after you eat the applesauce containing the crushed tablet. °Talk to your pediatrician regarding the use of this medicine in children. Special care may be needed. °Overdosage: If you think you have taken too much of this medicine contact a poison control center or emergency room at once. °NOTE: This medicine is only for you. Do not share this medicine with others. °What if I miss a dose? °If you take your medicine once a day and miss a dose, take the missed dose as soon as you remember. If you take your medicine twice a day and miss a dose, take the missed dose immediately. In this instance, 2 tablets may be taken at the same time. The next day you should take 1 tablet twice a day as directed. °What may interact with this medicine? °-aspirin and aspirin-like medicines °-certain antibiotics like erythromycin, azithromycin, and clarithromycin °-certain medicines for fungal infections like ketoconazole and itraconazole °-certain medicines for irregular heart beat like amiodarone, quinidine, dronedarone °-certain medicines for seizures like carbamazepine, phenytoin °-certain medicines that treat or prevent blood clots like warfarin, enoxaparin, and dalteparin °-conivaptan °-diltiazem °-felodipine °-indinavir °-lopinavir; ritonavir °-NSAIDS, medicines for pain and inflammation, like ibuprofen or naproxen °-ranolazine °-rifampin °-ritonavir °-St. John's wort °-verapamil °This list may not describe all possible interactions. Give your health care provider a list of all the medicines, herbs, non-prescription drugs, or dietary supplements you use. Also tell them if you smoke, drink alcohol, or use illegal drugs. Some items may interact with your medicine. °What   should I watch for while using this medicine? Visit your doctor or health care professional for regular checks on your progress. Your condition will be monitored carefully while you are receiving this  medicine. Notify your doctor or health care professional and seek emergency treatment if you develop breathing problems; changes in vision; chest pain; severe, sudden headache; pain, swelling, warmth in the leg; trouble speaking; sudden numbness or weakness of the face, arm, or leg. These can be signs that your condition has gotten worse. If you are going to have surgery, tell your doctor or health care professional that you are taking this medicine. Tell your health care professional that you use this medicine before you have a spinal or epidural procedure. Sometimes people who take this medicine have bleeding problems around the spine when they have a spinal or epidural procedure. This bleeding is very rare. If you have a spinal or epidural procedure while on this medicine, call your health care professional immediately if you have back pain, numbness or tingling (especially in your legs and feet), muscle weakness, paralysis, or loss of bladder or bowel control. Avoid sports and activities that might cause injury while you are using this medicine. Severe falls or injuries can cause unseen bleeding. Be careful when using sharp tools or knives. Consider using an Copy. Take special care brushing or flossing your teeth. Report any injuries, bruising, or red spots on the skin to your doctor or health care professional. What side effects may I notice from receiving this medicine? Side effects that you should report to your doctor or health care professional as soon as possible: -allergic reactions like skin rash, itching or hives, swelling of the face, lips, or tongue -back pain -redness, blistering, peeling or loosening of the skin, including inside the mouth -signs and symptoms of bleeding such as bloody or black, tarry stools; red or dark-brown urine; spitting up blood or brown material that looks like coffee grounds; red spots on the skin; unusual bruising or bleeding from the eye, gums, or  nose Side effects that usually do not require medical attention (Report these to your doctor or health care professional if they continue or are bothersome.): -dizziness -muscle pain This list may not describe all possible side effects. Call your doctor for medical advice about side effects. You may report side effects to FDA at 1-800-FDA-1088. Where should I keep my medicine? Keep out of the reach of children. Store at room temperature between 15 and 30 degrees C (59 and 86 degrees F). Throw away any unused medicine after the expiration date. NOTE: This sheet is a summary. It may not cover all possible information. If you have questions about this medicine, talk to your doctor, pharmacist, or health care provider.  2015, [employer]/Gold Standard. (2014-01-08 18:47:48) Pickup stool softener for constipation.  Left lower extremity Weight Bearing as tolerated  Progress activities slowly Expect left knee soreness and swelling. Apply heat or ice as needed. Keep left knee dressing clean dry and intact, may shower with dressing intact.

## 2014-10-14 NOTE — Progress Notes (Signed)
Subjective: 1 Day Post-Op Procedure(s) (LRB): LEFT TOTAL KNEE ARTHROPLASTY (Left) Patient reports pain as moderate to severe.    Objective: Vital signs in last 24 hours: Temp:  [97 F (36.1 C)-99 F (37.2 C)] 98.8 F (37.1 C) (01/13 0512) Pulse Rate:  [58-83] 83 (01/13 0512) Resp:  [8-18] 18 (01/13 0817) BP: (91-152)/(45-95) 91/45 mmHg (01/13 0512) SpO2:  [93 %-100 %] 93 % (01/13 0817) Weight:  [99.791 kg (220 lb)] 99.791 kg (220 lb) (01/12 1102)  Intake/Output from previous day: 01/12 0701 - 01/13 0700 In: 2815 [P.O.:540; I.V.:2225; IV Piggyback:50] Out: 1625 [WHQPR:9163; Blood:150] Intake/Output this shift:     Recent Labs  10/14/14 0551  HGB 10.4*    Recent Labs  10/14/14 0551  WBC 9.9  RBC 4.17  HCT 32.9*  PLT 215    Recent Labs  10/14/14 0551  NA 130*  K 4.1  CL 92*  CO2 30  BUN 6  CREATININE 0.62  GLUCOSE 175*  CALCIUM 8.0*   No results for input(s): LABPT, INR in the last 72 hours.  Neurovascular intact Sensation intact distally Dorsiflexion/Plantar flexion intact Incision: dressing C/D/I Compartment soft  Assessment/Plan: 1 Day Post-Op Procedure(s) (LRB): LEFT TOTAL KNEE ARTHROPLASTY (Left) Up with therapy  Monitor for symptoms of anemia , acute blood loss anemia secondary to surgery  CLARK, GILBERT 10/14/2014, 8:20 AM

## 2014-10-15 LAB — CBC
HEMATOCRIT: 30.6 % — AB (ref 36.0–46.0)
HEMOGLOBIN: 9.7 g/dL — AB (ref 12.0–15.0)
MCH: 25.5 pg — ABNORMAL LOW (ref 26.0–34.0)
MCHC: 31.7 g/dL (ref 30.0–36.0)
MCV: 80.5 fL (ref 78.0–100.0)
PLATELETS: 198 10*3/uL (ref 150–400)
RBC: 3.8 MIL/uL — AB (ref 3.87–5.11)
RDW: 14.8 % (ref 11.5–15.5)
WBC: 9.9 10*3/uL (ref 4.0–10.5)

## 2014-10-15 LAB — GLUCOSE, CAPILLARY
GLUCOSE-CAPILLARY: 149 mg/dL — AB (ref 70–99)
GLUCOSE-CAPILLARY: 193 mg/dL — AB (ref 70–99)
GLUCOSE-CAPILLARY: 121 mg/dL — AB (ref 70–99)
Glucose-Capillary: 153 mg/dL — ABNORMAL HIGH (ref 70–99)

## 2014-10-15 MED ORDER — RIVAROXABAN 10 MG PO TABS: 10.0000 mg | ORAL_TABLET | Freq: Every day | ORAL | Status: DC

## 2014-10-15 MED ORDER — METHOCARBAMOL 500 MG PO TABS: 500.0000 mg | ORAL_TABLET | Freq: Four times a day (QID) | ORAL | Status: DC | PRN

## 2014-10-15 MED ORDER — OXYCODONE HCL 5 MG PO TABS
5.0000 mg | ORAL_TABLET | ORAL | Status: DC | PRN
  Administered 2014-10-15 – 2014-10-16 (×4): 10 mg via ORAL
  Filled 2014-10-15 (×5): qty 2

## 2014-10-15 MED ORDER — OXYCODONE-ACETAMINOPHEN 5-325 MG PO TABS: 1.0000 | ORAL_TABLET | ORAL | Status: DC | PRN

## 2014-10-15 NOTE — Progress Notes (Signed)
Subjective: 2 Days Post-Op Procedure(s) (LRB): LEFT TOTAL KNEE ARTHROPLASTY (Left) Patient reports pain as mild.  Patient is a little drowsy this AM.   Objective: Vital signs in last 24 hours: Temp:  [99.1 F (37.3 C)-99.3 F (37.4 C)] 99.3 F (37.4 C) (01/14 0521) Pulse Rate:  [88-91] 88 (01/14 0521) Resp:  [14-18] 18 (01/14 0800) BP: (99-105)/(48-56) 105/56 mmHg (01/14 0521) SpO2:  [93 %-96 %] 93 % (01/14 0521)  Intake/Output from previous day: 01/13 0701 - 01/14 0700 In: 1160 [P.O.:1160] Out: -  Intake/Output this shift:     Recent Labs  10/14/14 0551 10/15/14 0352  HGB 10.4* 9.7*    Recent Labs  10/14/14 0551 10/15/14 0352  WBC 9.9 9.9  RBC 4.17 3.80*  HCT 32.9* 30.6*  PLT 215 198    Recent Labs  10/14/14 0551  NA 130*  K 4.1  CL 92*  CO2 30  BUN 6  CREATININE 0.62  GLUCOSE 175*  CALCIUM 8.0*   No results for input(s): LABPT, INR in the last 72 hours.  Left Knee: Sensation intact distally Intact pulses distally Dorsiflexion/Plantar flexion intact Incision: dressing C/D/I Compartment soft  Assessment/Plan: 2 Days Post-Op Procedure(s) (LRB): LEFT TOTAL KNEE ARTHROPLASTY (Left) Up with therapy  Plan discharge probably tomorrow if progress with PT Continues Adjust pain meds Monitor for symptoms of anemia Clennon Nasca 10/15/2014, 10:37 AM

## 2014-10-15 NOTE — Progress Notes (Signed)
Physical Therapy Treatment Patient Details Name: Debra Wright MRN: [id number] DOB: Apr 18, 1951 Today's Date: 10/15/2014    History of Present Illness Pt is a 64 y.o. female s/p Lt TKA.     PT Comments    Pt less drowsy this session vs morning session. Hopeful to D/C home after 2 session of PT tomorrow.   Follow Up Recommendations  Home health PT;Supervision/Assistance - 24 hour     Equipment Recommendations  Rolling walker with 5" wheels;3in1 (PT)    Recommendations for Other Services       Precautions / Restrictions Precautions Precautions: Fall;Knee Precaution Comments: reviewed no pillow under knee  Required Braces or Orthoses: Knee Immobilizer - Left Knee Immobilizer - Left: On when out of bed or walking Restrictions Weight Bearing Restrictions: Yes LLE Weight Bearing: Weight bearing as tolerated    Mobility  Bed Mobility Overal bed mobility: Needs Assistance Bed Mobility: Sit to Supine;Supine to Sit     Supine to sit: Min guard;HOB elevated Sit to supine: Min assist   General bed mobility comments: educated on using sheet as LE lift to (A) Lt LE to/off EOB; incr (A) required to return to supine due to fatigue   Transfers Overall transfer level: Needs assistance Equipment used: Rolling walker (2 wheeled) Transfers: Sit to/from Stand Sit to Stand: Mod assist         General transfer comment: pt with LOB posteriorly when standing from bed; cues for hand placement and sequencing   Ambulation/Gait Ambulation/Gait assistance: Min guard Ambulation Distance (Feet): 80 Feet Assistive device: Rolling walker (2 wheeled) Gait Pattern/deviations: Step-to pattern;Decreased stance time - left;Decreased step length - right;Wide base of support;Trunk flexed;Antalgic Gait velocity: decreased due to pain  Gait velocity interpretation: Below normal speed for age/gender General Gait Details: cues for upright posture and step through gt sequencing; required standing rest break  due to fatigue in UEs    Stairs            Wheelchair Mobility    Modified Rankin (Stroke Patients Only)       Balance Overall balance assessment: Needs assistance Sitting-balance support: Feet supported;No upper extremity supported Sitting balance-Leahy Scale: Good   Postural control: Posterior lean Standing balance support: During functional activity;Bilateral upper extremity supported Standing balance-Leahy Scale: Poor Standing balance comment: posterior LOB with standing; requiring RW and mod (A) to balance                     Cognition Arousal/Alertness: Awake/alert Behavior During Therapy: Flat affect Overall Cognitive Status: Within Functional Limits for tasks assessed                      Exercises Total Joint Exercises Ankle Circles/Pumps: AROM;Both;10 reps;Supine Heel Slides: AAROM;Left;10 reps;Limitations;Supine Heel Slides Limitations: pain Long Arc Quad: Left;10 reps;Seated;AAROM Long [employer] Limitations: pain    General Comments General comments (skin integrity, edema, etc.): discussed importance of BSC or walkign to bathroom vs bed pan      Pertinent Vitals/Pain Pain Assessment: 0-10 Pain Score: 8  Pain Location: Lt knee Pain Descriptors / Indicators: Sore Pain Intervention(s): Monitored during session;Premedicated before session;Repositioned;Ice applied    Home Living                      Prior Function            PT Goals (current goals can now be found in the care plan section) Acute Rehab PT Goals Patient Stated Goal:  home tomorrow PT Goal Formulation: With patient Time For Goal Achievement: 10/21/14 Potential to Achieve Goals: Good Progress towards PT goals: Progressing toward goals    Frequency  7X/week    PT Plan Current plan remains appropriate    Co-evaluation             End of Session Equipment Utilized During Treatment: Gait belt;Left knee immobilizer Activity Tolerance: Patient  tolerated treatment well Patient left: in bed;with call bell/phone within reach     Time: 1431-1459 PT Time Calculation (min) (ACUTE ONLY): 28 min  Charges:  $Gait Training: 8-22 mins $Therapeutic Exercise: 8-22 mins                    G CodesGustavus Bryant, [place]  [phone] 10/15/2014, 4:17 PM

## 2014-10-15 NOTE — Progress Notes (Signed)
Physical Therapy Treatment Patient Details Name: Debra Wright MRN: [id number] DOB: April 28, 1951 Today's Date: 10/15/2014    History of Present Illness Pt is a 64 y.o. female s/p Lt TKA.     PT Comments    Pt lethargic and very slow at progressing. Pt requiring max cues to stay on task and for safety. Pt not safe to D/C home from mobility standpoint at this time. Will see this afternoon for additional session.   Follow Up Recommendations  Home health PT;Supervision/Assistance - 24 hour     Equipment Recommendations  Rolling walker with 5" wheels;3in1 (PT)    Recommendations for Other Services       Precautions / Restrictions Precautions Precautions: Fall;Knee Precaution Comments: pt unable to recall precautions or previous sessions Required Braces or Orthoses: Knee Immobilizer - Left Knee Immobilizer - Left: On when out of bed or walking Restrictions Weight Bearing Restrictions: Yes LLE Weight Bearing: Weight bearing as tolerated    Mobility  Bed Mobility               General bed mobility comments: up in chair and returned to chair   Transfers Overall transfer level: Needs assistance Equipment used: Rolling walker (2 wheeled) Transfers: Sit to/from Stand Sit to Stand: Min assist         General transfer comment: (A) to balance when transferring hands to RW; cues for safety   Ambulation/Gait Ambulation/Gait assistance: Min guard Ambulation Distance (Feet): 40 Feet Assistive device: Rolling walker (2 wheeled) Gait Pattern/deviations: Step-to pattern;Decreased stance time - left;Decreased step length - right;Trunk flexed;Antalgic Gait velocity: decreased due to pain  Gait velocity interpretation: Below normal speed for age/gender General Gait Details: required multiple standing rest breaks; pt demo difficulty applying safe technique for RW; min guard to steady    Stairs            Wheelchair Mobility    Modified Rankin (Stroke Patients Only)        Balance Overall balance assessment: Needs assistance Sitting-balance support: Feet supported;No upper extremity supported Sitting balance-Leahy Scale: Fair     Standing balance support: During functional activity;Bilateral upper extremity supported Standing balance-Leahy Scale: Poor Standing balance comment: RW to balance                     Cognition Arousal/Alertness: Lethargic;Suspect due to medications Behavior During Therapy: Flat affect Overall Cognitive Status: Impaired/Different from baseline Area of Impairment: Memory;Orientation;Problem solving Orientation Level: Disoriented to;Time   Memory: Decreased recall of precautions;Decreased short-term memory       Problem Solving: Slow processing;Requires verbal cues;Requires tactile cues General Comments: lethargic, suspect due to medications ?     Exercises Total Joint Exercises Ankle Circles/Pumps: AROM;Both;10 reps;Seated Quad Sets: AROM;Left;10 reps;Seated;Limitations Quad Sets Limitations: pt lethargic and falling asleep  Long [employer]: Left;10 reps;Seated;AAROM Long [employer] Limitations: pain Knee Flexion: AAROM;Left;5 reps;Seated Goniometric ROM: 5 to 65 degrees; limited by pain     General Comments        Pertinent Vitals/Pain Pain Assessment: 0-10 Pain Score: 7  Pain Location: Lt knee Pain Descriptors / Indicators: Sore Pain Intervention(s): Monitored during session;Premedicated before session;Repositioned;Ice applied    Home Living                      Prior Function            PT Goals (current goals can now be found in the care plan section) Acute Rehab PT Goals PT Goal Formulation:  With patient Time For Goal Achievement: 10/21/14 Potential to Achieve Goals: Good Progress towards PT goals: Progressing toward goals    Frequency  7X/week    PT Plan Current plan remains appropriate    Co-evaluation             End of Session Equipment Utilized During Treatment:  Gait belt;Left knee immobilizer Activity Tolerance: Patient limited by pain Patient left: in chair;with call bell/phone within reach     Time: 0805-0831 PT Time Calculation (min) (ACUTE ONLY): 26 min  Charges:  $Gait Training: 8-22 mins $Therapeutic Exercise: 8-22 mins                    G CodesGustavus Bryant, [place]  [phone] 10/15/2014, 8:39 AM

## 2014-10-16 LAB — GLUCOSE, CAPILLARY
Glucose-Capillary: 145 mg/dL — ABNORMAL HIGH (ref 70–99)
Glucose-Capillary: 155 mg/dL — ABNORMAL HIGH (ref 70–99)

## 2014-10-16 LAB — URINALYSIS, ROUTINE W REFLEX MICROSCOPIC
Bilirubin Urine: NEGATIVE
GLUCOSE, UA: NEGATIVE mg/dL
KETONES UR: NEGATIVE mg/dL
LEUKOCYTES UA: NEGATIVE
Nitrite: NEGATIVE
Protein, ur: NEGATIVE mg/dL
Specific Gravity, Urine: 1.004 — ABNORMAL LOW (ref 1.005–1.030)
Urobilinogen, UA: 0.2 mg/dL (ref 0.0–1.0)
pH: 6.5 (ref 5.0–8.0)

## 2014-10-16 LAB — CBC
HEMATOCRIT: 26.6 % — AB (ref 36.0–46.0)
HEMOGLOBIN: 8.7 g/dL — AB (ref 12.0–15.0)
MCH: 26 pg (ref 26.0–34.0)
MCHC: 32.7 g/dL (ref 30.0–36.0)
MCV: 79.4 fL (ref 78.0–100.0)
Platelets: 186 10*3/uL (ref 150–400)
RBC: 3.35 MIL/uL — ABNORMAL LOW (ref 3.87–5.11)
RDW: 14.7 % (ref 11.5–15.5)
WBC: 7.7 10*3/uL (ref 4.0–10.5)

## 2014-10-16 LAB — URINE MICROSCOPIC-ADD ON

## 2014-10-16 NOTE — Progress Notes (Signed)
Physical Therapy Treatment Patient Details Name: Debra Wright MRN: [id number] DOB: 20-Jun-1951 Today's Date: 10/16/2014    History of Present Illness Pt is a 64 y.o. female s/p Lt TKA.     PT Comments    Pt moving much better this morning and less lethargic. Continues to require supervision for safety. Will see prior to D/C this afternoon for additional session.   Follow Up Recommendations  Home health PT;Supervision/Assistance - 24 hour     Equipment Recommendations  Rolling walker with 5" wheels;3in1 (PT)    Recommendations for Other Services       Precautions / Restrictions Precautions Precautions: Knee Precaution Comments: reviewed no pillow under knee  Required Braces or Orthoses: Knee Immobilizer - Left Knee Immobilizer - Left: On when out of bed or walking Restrictions Weight Bearing Restrictions: Yes LLE Weight Bearing: Weight bearing as tolerated    Mobility  Bed Mobility Overal bed mobility: Modified Independent Bed Mobility: Supine to Sit     Supine to sit: Modified independent (Device/Increase time) Sit to supine: Modified independent (Device/Increase time)   General bed mobility comments: bed flattened; incr time   Transfers Overall transfer level: Needs assistance Equipment used: Rolling walker (2 wheeled) Transfers: Sit to/from Stand Sit to Stand: Supervision Stand pivot transfers: Min guard       General transfer comment: cues for hand placement; transferred from recliner and from [facility]   Ambulation/Gait Ambulation/Gait assistance: Supervision Ambulation Distance (Feet): 100 Feet Assistive device: Rolling walker (2 wheeled) Gait Pattern/deviations: Step-through pattern;Decreased stance time - left;Decreased step length - right;Wide base of support Gait velocity: decreased due to pain  Gait velocity interpretation: Below normal speed for age/gender General Gait Details: cues for upright posture and gt sequencing    Stairs             Wheelchair Mobility    Modified Rankin (Stroke Patients Only)       Balance Overall balance assessment: Needs assistance Sitting-balance support: Feet supported;No upper extremity supported Sitting balance-Leahy Scale: Good     Standing balance support: During functional activity;Bilateral upper extremity supported Standing balance-Leahy Scale: Poor Standing balance comment: min guard at toilet; RW to balance                    Cognition Arousal/Alertness: Awake/alert Behavior During Therapy: WFL for tasks assessed/performed Overall Cognitive Status: Within Functional Limits for tasks assessed                      Exercises Total Joint Exercises Ankle Circles/Pumps: AROM;Both;10 reps;Seated Heel Slides: AAROM;Left;10 reps;Limitations;Supine Hip ABduction/ADduction: AAROM;Left;10 reps;Seated Long Arc Quad: Left;10 reps;Seated;AAROM Goniometric ROM: 0 to 70 degrees     General Comments        Pertinent Vitals/Pain Pain Assessment: 0-10 Pain Score: 4  Pain Location: Lt knee with flexion Pain Descriptors / Indicators: Sore Pain Intervention(s): Monitored during session;Premedicated before session;Repositioned;Ice applied    Home Living                      Prior Function            PT Goals (current goals can now be found in the care plan section) Acute Rehab PT Goals Patient Stated Goal: home today  PT Goal Formulation: With patient Time For Goal Achievement: 10/21/14 Potential to Achieve Goals: Good Progress towards PT goals: Progressing toward goals    Frequency  7X/week    PT Plan Current plan remains appropriate  Co-evaluation             End of Session Equipment Utilized During Treatment: Gait belt;Left knee immobilizer Activity Tolerance: Patient tolerated treatment well Patient left: in bed;with call bell/phone within reach;with family/visitor present     Time: 0804-0829 PT Time Calculation (min) (ACUTE  ONLY): 25 min  Charges:  $Gait Training: 8-22 mins $Therapeutic Exercise: 8-22 mins                    G CodesGustavus Bryant, [place] 10/16/2014, 8:36 AM

## 2014-10-16 NOTE — Discharge Summary (Signed)
Patient ID: Debra Wright MRN: [id number] DOB/AGE: [birth year] 64 y.o.  Admit date: 10/13/2014 Discharge date: 10/16/2014  Admission Diagnoses:  Principal Problem:   Arthritis of left knee Active Problems:   Status post total left knee replacement   Discharge Diagnoses:  Same  Past Medical History  Diagnosis Date  . Hypertension   . Depression   . Anxiety   . GERD (gastroesophageal reflux disease)   . High cholesterol   . Type II diabetes mellitus   . Migraine     "frequently when I was younger; maybe monthly now" (10/14/2014)  . Arthritis     "knees; right thumb" (10/14/2014)  . Basal cell carcinoma     "burned off upper lip & right shoulder"    Surgeries: Procedure(s): LEFT TOTAL KNEE ARTHROPLASTY on 10/13/2014   Consultants:    Discharged Condition: Improved  Hospital Course: Debra Wright is an 64 y.o. female who was admitted 10/13/2014 for operative treatment ofArthritis of left knee. Patient has severe unremitting pain that affects sleep, daily activities, and work/hobbies. After pre-op clearance the patient was taken to the operating room on 10/13/2014 and underwent  Procedure(s): LEFT TOTAL KNEE ARTHROPLASTY.    Patient was given perioperative antibiotics: Anti-infectives    Start     Dose/Rate Route Frequency Ordered Stop   10/13/14 1830  ceFAZolin (ANCEF) IVPB 2 g/50 mL premix     2 g100 mL/hr over 30 Minutes Intravenous Every 6 hours 10/13/14 1707 10/14/14 0230   10/13/14 0600  ceFAZolin (ANCEF) IVPB 2 g/50 mL premix     2 g100 mL/hr over 30 Minutes Intravenous On call to O.R. 10/12/14 1322 10/13/14 1240       Patient was given sequential compression devices, early ambulation, and chemoprophylaxis to prevent DVT.  Patient benefited maximally from hospital stay and there were no complications.    Recent vital signs: Patient Vitals for the past 24 hrs:  BP Temp Temp src Pulse Resp SpO2  10/16/14 0529 (!) 102/45 mmHg 98.9 F (37.2 C) Oral 81 17 91 %  10/16/14 0400  - - - - 17 91 %  10/15/14 2355 - - - - 19 90 %  10/15/14 2000 - - - - 19 90 %  10/15/14 1900 (!) 125/49 mmHg 98.6 F (37 C) Oral 84 19 90 %  10/15/14 1600 - - - - 18 -  10/15/14 1255 133/62 mmHg 99.5 F (37.5 C) - 88 18 91 %  10/15/14 1200 - - - - 18 -  10/15/14 0800 - - - - 18 -     Recent laboratory studies:  Recent Labs  10/14/14 0551 10/15/14 0352 10/16/14 0500  WBC 9.9 9.9 7.7  HGB 10.4* 9.7* 8.7*  HCT 32.9* 30.6* 26.6*  PLT 215 198 186  NA 130*  --   --   K 4.1  --   --   CL 92*  --   --   CO2 30  --   --   BUN 6  --   --   CREATININE 0.62  --   --   GLUCOSE 175*  --   --   CALCIUM 8.0*  --   --      Discharge Medications:     Medication List    STOP taking these medications        aspirin 325 MG EC tablet     aspirin 81 MG tablet     ibuprofen 200 MG tablet  Commonly known as:  ADVIL,MOTRIN  traMADol 50 MG tablet  Commonly known as:  ULTRAM      TAKE these medications        amLODipine 10 MG tablet  Commonly known as:  NORVASC  Take 10 mg by mouth daily.     Biotin 5000 MCG Tabs  Take 5,000 mcg by mouth daily.     CALCIUM 600 + D PO  Take 1 tablet by mouth daily.     calcium carbonate 500 MG chewable tablet  Commonly known as:  TUMS - dosed in mg elemental calcium  Chew 2 tablets by mouth daily as needed for indigestion or heartburn.     citalopram 40 MG tablet  Commonly known as:  CELEXA  Take 40 mg by mouth every morning.     diazepam 5 MG tablet  Commonly known as:  VALIUM  Take 1 tablet (5 mg total) by mouth every 6 (six) hours as needed (dizziness).     eszopiclone 2 MG Tabs tablet  Commonly known as:  LUNESTA  Take 2 mg by mouth at bedtime as needed for sleep. Take immediately before bedtime     metFORMIN 1000 MG tablet  Commonly known as:  GLUCOPHAGE  Take 1,000 mg by mouth 2 (two) times daily with a meal.     methocarbamol 500 MG tablet  Commonly known as:  ROBAXIN  Take 1 tablet (500 mg total) by mouth every 6  (six) hours as needed for muscle spasms.     omega-3 acid ethyl esters 1 G capsule  Commonly known as:  LOVAZA  Take 2 g by mouth 2 (two) times daily.     omeprazole 40 MG capsule  Commonly known as:  PRILOSEC  Take 40 mg by mouth every morning.     oxyCODONE-acetaminophen 5-325 MG per tablet  Commonly known as:  ROXICET  Take 1-2 tablets by mouth every 4 (four) hours as needed for severe pain.     pravastatin 20 MG tablet  Commonly known as:  PRAVACHOL  Take 20 mg by mouth at bedtime.     rivaroxaban 10 MG Tabs tablet  Commonly known as:  XARELTO  Take 1 tablet (10 mg total) by mouth daily with breakfast.        Diagnostic Studies: Dg Knee Left Port  10/13/2014   CLINICAL DATA:  Postop left total knee arthroplasty.  EXAM: PORTABLE LEFT KNEE - 1-2 VIEW  COMPARISON:  None.  FINDINGS: Well seated components of a total left knee arthroplasty without complicating features. Periosteal reaction noted involving the tibia and fibular shafts.  IMPRESSION: Well seated components of a total knee arthroplasty.   Electronically Signed   By: Kalman Jewels M.D.   On: 10/13/2014 16:46    Disposition: 01-Home or Self Care      Discharge Instructions    Call MD / Call 911    Complete by:  As directed   If you experience chest pain or shortness of breath, CALL 911 and be transported to the hospital emergency room.  If you develope a fever above 101 F, pus (white drainage) or increased drainage or redness at the wound, or calf pain, call your surgeon's office.     Constipation Prevention    Complete by:  As directed   Drink plenty of fluids.  Prune juice may be helpful.  You may use a stool softener, such as Colace (over the counter) 100 mg twice a day.  Use MiraLax (over the counter) for constipation as needed.  Diet - low sodium heart healthy    Complete by:  As directed      Discharge instructions    Complete by:  As directed   Increase your activities as comfort allows. Work on your  knee motion. You can get your actual dressing wet daily in the shower and leave it on until your outpatient follow-up.     Discharge patient    Complete by:  As directed      Discharge wound care:    Complete by:  As directed   Keep dressing clean and intact. May shower with dressing intact.     Elevate operative extremity    Complete by:  As directed   Encourage patient to wiggle toes often     Increase activity slowly as tolerated    Complete by:  As directed      Weight bearing as tolerated    Complete by:  As directed   Laterality:  left  Extremity:  Lower           Follow-up Information    Follow up with Mcarthur Rossetti, MD. Schedule an appointment as soon as possible for a visit in 2 weeks.   Specialty:  Orthopedic Surgery   Contact information:   [place] [address] [phone]        Signed: Mcarthur Rossetti 10/16/2014, 6:55 AM

## 2014-10-16 NOTE — Progress Notes (Signed)
CARE MANAGEMENT NOTE 10/16/2014  Patient:  Debra Wright, Debra Wright   Account Number:  [id number]  Date Initiated:  10/14/2014  Documentation initiated by:  Fuller Plan  Subjective/Objective Assessment:   s/p left TKA     Action/Plan:   PT/OT evals-recommended HHPT   Anticipated DC Date:  10/16/2014   Anticipated DC Plan:  [facility]  CM consult      [place]   Choice offered to / List presented to:  C-1 Patient   DME arranged  3-N-1  Vassie Moselle      DME agency  [place] arranged  [place].   Status of service:  Completed, signed off Medicare Important Message given?  YES (If response is "NO", the following Medicare IM given date fields will be blank) Date Medicare IM given:  10/16/2014 Medicare IM given by:  [facility] Date Additional Medicare IM given:   Additional Medicare IM given by:    Discharge Disposition:  [place]  Per UR Regulation:  Reviewed for med. necessity/level of care/duration of stay  If discussed at [place] of Stay Meetings, dates discussed:    Comments:  10/14/14 Set up with  Leonard Downing by MD office. Patient selected [facility]. Spoke with Stanton Kidney at [place] about switch of agencies. Contacted Miranda at [place] and set up [place]. Contacted Frank with [facility] and requested rolling walker and 3N1 be delivered to patient's room. Fuller Plan RN, BSN, CCM

## 2014-10-16 NOTE — Plan of Care (Signed)
Problem: Acute Rehab OT Goals (only OT should resolve) Goal: Pt. Will Perform Tub/Shower Transfer Outcome: Not Met (add Reason) Pt. States she plans on sponge bathing initially and has a step she uses for in/out of tub so plans on deferring this task to [facility] therapies to practice actual set up.

## 2014-10-16 NOTE — Progress Notes (Signed)
Subjective: 3 Days Post-Op Procedure(s) (LRB): LEFT TOTAL KNEE ARTHROPLASTY (Left) Patient reports pain as moderate.  Made better progress with therapy.  Asymptomatic acute blood loss anemia.  Objective: Vital signs in last 24 hours: Temp:  [98.6 F (37 C)-99.5 F (37.5 C)] 98.9 F (37.2 C) (01/15 0529) Pulse Rate:  [81-88] 81 (01/15 0529) Resp:  [17-19] 17 (01/15 0529) BP: (102-133)/(45-62) 102/45 mmHg (01/15 0529) SpO2:  [90 %-91 %] 91 % (01/15 0529)  Intake/Output from previous day: 01/14 0701 - 01/15 0700 In: 540 [P.O.:540] Out: -  Intake/Output this shift: Total I/O In: 60 [P.O.:60] Out: -    Recent Labs  10/14/14 0551 10/15/14 0352 10/16/14 0500  HGB 10.4* 9.7* 8.7*    Recent Labs  10/15/14 0352 10/16/14 0500  WBC 9.9 7.7  RBC 3.80* 3.35*  HCT 30.6* 26.6*  PLT 198 186    Recent Labs  10/14/14 0551  NA 130*  K 4.1  CL 92*  CO2 30  BUN 6  CREATININE 0.62  GLUCOSE 175*  CALCIUM 8.0*   No results for input(s): LABPT, INR in the last 72 hours.  Sensation intact distally Intact pulses distally Dorsiflexion/Plantar flexion intact Incision: dressing C/D/I  Assessment/Plan: 3 Days Post-Op Procedure(s) (LRB): LEFT TOTAL KNEE ARTHROPLASTY (Left) Discharge home with home health today.  Mcarthur Rossetti 10/16/2014, 6:53 AM

## 2014-10-16 NOTE — Progress Notes (Signed)
Physical Therapy Treatment Patient Details Name: Debra Wright MRN: [id number] DOB: [birth year] Today's Date: 10/16/2014    History of Present Illness Pt is a 64 y.o. female s/p Lt TKA.     PT Comments    Pt at supervision level for safety. Reviewed car transfer technique and mobility recommendations with pt and husband. Pt safe from mobility standpoint to D/C home with 24/7 (A).  Follow Up Recommendations  Home health PT;Supervision/Assistance - 24 hour     Equipment Recommendations  Rolling walker with 5" wheels;3in1 (PT)    Recommendations for Other Services       Precautions / Restrictions Precautions Precautions: Knee Precaution Comments: reviewed no pillow under knee  Required Braces or Orthoses: Knee Immobilizer - Left Knee Immobilizer - Left: On when out of bed or walking Restrictions Weight Bearing Restrictions: Yes LLE Weight Bearing: Weight bearing as tolerated    Mobility  Bed Mobility               General bed mobility comments: up in chair and returned to chair   Transfers Overall transfer level: Needs assistance Equipment used: Rolling walker (2 wheeled) Transfers: Sit to/from Stand Sit to Stand: Supervision         General transfer comment: min cues for technique   Ambulation/Gait Ambulation/Gait assistance: Supervision Ambulation Distance (Feet): 130 Feet Assistive device: Rolling walker (2 wheeled) Gait Pattern/deviations: Step-to pattern;Decreased stance time - left;Decreased step length - right;Antalgic;Wide base of support Gait velocity: decreased due to pain  Gait velocity interpretation: Below normal speed for age/gender General Gait Details: cues for step through gt sequencing and safety with RW    Stairs            Wheelchair Mobility    Modified Rankin (Stroke Patients Only)       Balance Overall balance assessment: Needs assistance Sitting-balance support: Feet supported;No upper extremity supported Sitting  balance-Leahy Scale: Good     Standing balance support: During functional activity;Bilateral upper extremity supported Standing balance-Leahy Scale: Poor Standing balance comment: RW for UE support                    Cognition Arousal/Alertness: Awake/alert Behavior During Therapy: WFL for tasks assessed/performed Overall Cognitive Status: Within Functional Limits for tasks assessed                      Exercises Total Joint Exercises Ankle Circles/Pumps: AROM;Both;10 reps;Seated Quad Sets: AROM;Left;10 reps;Seated;Limitations Quad Sets Limitations: pain Heel Slides: AAROM;Left;10 reps;Limitations;Supine Hip ABduction/ADduction: AAROM;Left;10 reps;Seated Long Arc Quad: Left;10 reps;Seated;AAROM    General Comments General comments (skin integrity, edema, etc.): reviewed car transfer technique and educated on KI donning/doffing       Pertinent Vitals/Pain Pain Assessment: 0-10 Pain Score: 5  Pain Location: Lt knee with exercises  Pain Descriptors / Indicators: Aching;Sore Pain Intervention(s): Premedicated before session;Monitored during session;Repositioned;Ice applied;Patient requesting pain meds-RN notified    Home Living                      Prior Function            PT Goals (current goals can now be found in the care plan section) Acute Rehab PT Goals Patient Stated Goal: home after this PT Goal Formulation: With patient Time For Goal Achievement: 10/21/14 Potential to Achieve Goals: Good Progress towards PT goals: Progressing toward goals    Frequency  7X/week    PT Plan Current plan remains appropriate  Co-evaluation             End of Session Equipment Utilized During Treatment: Gait belt;Left knee immobilizer Activity Tolerance: Patient tolerated treatment well Patient left: in chair;with call bell/phone within reach;with family/visitor present     Time: 9292-4462 PT Time Calculation (min) (ACUTE ONLY): 24  min  Charges:  $Gait Training: 8-22 mins $Therapeutic Exercise: 8-22 mins                    G CodesElie Confer Stebbins, [place] 10/16/2014, 3:01 PM

## 2014-10-16 NOTE — Progress Notes (Signed)
Occupational Therapy Treatment Patient Details Name: Debra Wright MRN: [id number] DOB: [birth year] Today's Date: 10/16/2014    History of present illness Pt is a 64 y.o. female s/p Lt TKA.    OT comments  Pt. Progressing well with skilled OT.  Mod I for bed mobility, plans on using A/E for LB ADLS.  All aspects of toileting at min guard A.  States she will sponge bathe initially.  Eager for anticipated d/c home later today.    Follow Up Recommendations  No OT follow up;Supervision - Intermittent    Equipment Recommendations  3 in 1 bedside comode;Other (comment)    Recommendations for Other Services      Precautions / Restrictions Precautions Precautions: Fall;Knee Precaution Comments: reviewed no pillow under knee  Required Braces or Orthoses: Knee Immobilizer - Left Knee Immobilizer - Left: On when out of bed or walking Restrictions LLE Weight Bearing: Weight bearing as tolerated       Mobility Bed Mobility Overal bed mobility: Modified Independent Bed Mobility: Supine to Sit     Supine to sit: Modified independent (Device/Increase time)     General bed mobility comments: hob flat, no rails pt. able to transfer oob, states she will likely be sleeping in recliner though at home  Transfers Overall transfer level: Needs assistance Equipment used: Rolling walker (2 wheeled) Transfers: Sit to/from Stand Sit to Stand: Min guard Stand pivot transfers: Min guard       General transfer comment: cues for sequencing, pt. tends to turn too soon when begining pivot to desired surface    Balance                                   ADL Overall ADL's : Needs assistance/impaired     Grooming: Wash/dry hands;Standing;Supervision/safety       Lower Body Bathing: Sit to/from stand;Min guard Lower Body Bathing Details (indicate cue type and reason): introduced use of A/E (LH sponge)     Lower Body Dressing: Minimal assistance;Sit to/from stand;With adaptive  equipment;Cueing for sequencing Lower Body Dressing Details (indicate cue type and reason): introduced A/E and pt. able to return demo of use of sock aide and reacher Toilet Transfer: Min guard;Cueing for sequencing;Ambulation;BSC;Grab bars;RW;Comfort height toilet   Toileting- Clothing Manipulation and Hygiene: Modified independent;Sitting/lateral lean     Tub/Shower Transfer Details (indicate cue type and reason): pt. agreed to defer to [facility] therapies, states she purschased a "step" that her husband uses to get in/out of tub, unable to simulate this secondary to pt. still using KI for ambulation Functional mobility during ADLs: Min guard;Rolling walker;Cueing for sequencing;Cueing for safety General ADL Comments: rec. a/e for pt. to use as pt.s spouse has back problems and is unable to assist her with lb adls      Vision                     Perception     Praxis      Cognition   Behavior During Therapy: [facility] for tasks assessed/performed Overall Cognitive Status: Within Functional Limits for tasks assessed                       Extremity/Trunk Assessment               Exercises     Shoulder Instructions       General Comments      Pertinent  Vitals/ Pain       Pain Assessment: No/denies pain  Home Living                                          Prior Functioning/Environment              Frequency Min 2X/week     Progress Toward Goals  OT Goals(current goals can now be found in the care plan section)  Progress towards OT goals: Progressing toward goals     Plan Discharge plan remains appropriate    Co-evaluation                 End of Session Equipment Utilized During Treatment: Gait belt;Rolling walker   Activity Tolerance Patient tolerated treatment well   Patient Left in chair;with call bell/phone within reach;with family/visitor present   Nurse Communication          Time: 0141-0301 OT Time  Calculation (min): 28 min  Charges: OT General Charges $OT Visit: 1 Procedure OT Treatments $Self Care/Home Management : 23-37 mins  Janice Coffin, COTA/L 10/16/2014, 7:37 AM

## 2014-10-17 ENCOUNTER — Emergency Department (HOSPITAL_COMMUNITY)
Admission: EM | Admit: 2014-10-17 | Discharge: 2014-10-17 | Disposition: A | Discharge status: Home / Self Care | Payer: Medicare Other | Attending: Emergency Medicine | Admitting: Emergency Medicine

## 2014-10-17 ENCOUNTER — Encounter (HOSPITAL_COMMUNITY): Payer: Self-pay | Admitting: Emergency Medicine

## 2014-10-17 DIAGNOSIS — Z85828 Personal history of other malignant neoplasm of skin: Secondary | ICD-10-CM | POA: Insufficient documentation

## 2014-10-17 DIAGNOSIS — Z792 Long term (current) use of antibiotics: Secondary | ICD-10-CM | POA: Insufficient documentation

## 2014-10-17 DIAGNOSIS — F329 Major depressive disorder, single episode, unspecified: Secondary | ICD-10-CM | POA: Diagnosis not present

## 2014-10-17 DIAGNOSIS — E119 Type 2 diabetes mellitus without complications: Secondary | ICD-10-CM | POA: Diagnosis not present

## 2014-10-17 DIAGNOSIS — F419 Anxiety disorder, unspecified: Secondary | ICD-10-CM | POA: Diagnosis not present

## 2014-10-17 DIAGNOSIS — E780 Pure hypercholesterolemia: Secondary | ICD-10-CM | POA: Insufficient documentation

## 2014-10-17 DIAGNOSIS — M25462 Effusion, left knee: Secondary | ICD-10-CM | POA: Insufficient documentation

## 2014-10-17 DIAGNOSIS — Z79899 Other long term (current) drug therapy: Secondary | ICD-10-CM | POA: Insufficient documentation

## 2014-10-17 DIAGNOSIS — G43909 Migraine, unspecified, not intractable, without status migrainosus: Secondary | ICD-10-CM | POA: Insufficient documentation

## 2014-10-17 DIAGNOSIS — M1990 Unspecified osteoarthritis, unspecified site: Secondary | ICD-10-CM | POA: Insufficient documentation

## 2014-10-17 DIAGNOSIS — Z79891 Long term (current) use of opiate analgesic: Secondary | ICD-10-CM | POA: Insufficient documentation

## 2014-10-17 DIAGNOSIS — M7989 Other specified soft tissue disorders: Secondary | ICD-10-CM | POA: Diagnosis not present

## 2014-10-17 DIAGNOSIS — K219 Gastro-esophageal reflux disease without esophagitis: Secondary | ICD-10-CM | POA: Diagnosis not present

## 2014-10-17 DIAGNOSIS — I10 Essential (primary) hypertension: Secondary | ICD-10-CM | POA: Insufficient documentation

## 2014-10-17 DIAGNOSIS — Z96652 Presence of left artificial knee joint: Secondary | ICD-10-CM | POA: Insufficient documentation

## 2014-10-17 DIAGNOSIS — Z791 Long term (current) use of non-steroidal anti-inflammatories (NSAID): Secondary | ICD-10-CM | POA: Insufficient documentation

## 2014-10-17 DIAGNOSIS — M9689 Other intraoperative and postprocedural complications and disorders of the musculoskeletal system: Secondary | ICD-10-CM | POA: Diagnosis not present

## 2014-10-17 DIAGNOSIS — M79609 Pain in unspecified limb: Secondary | ICD-10-CM | POA: Diagnosis not present

## 2014-10-17 MED ORDER — OXYCODONE-ACETAMINOPHEN 5-325 MG PO TABS
2.0000 | ORAL_TABLET | ORAL | Status: DC | PRN
  Administered 2014-10-17: 2 via ORAL
  Filled 2014-10-17: qty 2

## 2014-10-17 NOTE — ED Provider Notes (Signed)
Medical screening examination/treatment/procedure(s) were conducted as a shared visit with non-physician practitioner(s) and myself.  I personally evaluated the patient during the encounter.   EKG Interpretation None      64 year old female presenting with chief complaint of decreased pulses in her left foot when her physical therapist attempted to palpate her pulses. She is a few days status post left knee surgery.  She also notes that her physical therapist found her to be hypoxic. She denies shortness of breath or chest pain. No fevers. On exam, well appearing, nontoxic, not distressed, normal respiratory effort, normal perfusion, lungs clear to auscultation bilaterally, heart sounds normal with regular rate and rhythm, left lower extremity with clean incision without drainage or significant erythema, 1+ left lower extremity edema, 1+ DP pulses palpable with triphasic dopplered pulses. Ultrasound obtained of her leg with DVT. She is not hypoxic here and has no signs or symptoms to explain hypoxia. She reports compliance with her Xarelto and I do not think she's had a PE. Maybe her transient hypoxia was either erroneous reading or from atelectasis. Plan discharge home with outpatient follow-up. Given return precautions..   Clinical Impression: 1. Status post total left knee replacement    left lower extremity swelling    Houston Siren III, MD 10/17/14 [phone]

## 2014-10-17 NOTE — ED Notes (Signed)
Patient had left total knee arthroplasty on Tuesday, the 12th.  Patient left hospital yesterday which was an uneventful stay after her surgery except for problems with bladder urgency that has improved.  Pain has been under control with medications she was discharged with.  This morning, PT was working with patient and was unable to locate a DP pulse on affected extremity.  PTAR states they were able to find one, HR and spO2 were pretty equal in extremity compared to other extremities.  No loss of sensation, or motor activity in leg.  Skin warm and dry in both feet.  To obtain with doppler as pulse is faint upon palpation.  Hx of DM and HTN.

## 2014-10-17 NOTE — Discharge Instructions (Signed)
Venous Thromboembolism, Prevention A venous thromboembolism is a blood clot that forms in a vein. A blood clot in a deep vein is called a deep venous thrombosis (DVT). A blood clot in the lungs is called a pulmonary embolism (PE). Blood clots are dangerous and can cause death. Blood clots can form in the:  Lungs.  Legs.  Arms. CAUSES  A blood clot can form in a vein from different conditions. A blood clot can develop due to:  Blood flow within a vein that is sluggish or very slow.  Medical conditions that make the blood clot easily.  Vein damage. RISK FACTORS Risk factors can increase your risk of developing a blood clot. Risk factors can include:  Smoking.  Obesity.  Age.  Immobility or sedentary lifestyle.  Sitting or standing for long periods of time.  Chronic or long-term bedrest.  Medical or past history of blood clots.  Family history of blood clots.  Hip, leg, or pelvis injury or trauma.  Major surgery, especially surgery on the hip, knee, or abdomen.  Pregnancy and childbirth.  Birth control pills and hormone replacement therapy.  Medical conditions such as  Peripheral vascular disease (PVD).  Diabetes.  Cancer. SYMPTOMS  Symptoms of VTE can depend on where the clot is located and if the clot breaks off and travels to another organ. Sometimes, there may be no symptoms.   DVT symptoms can include:  Swelling of the leg or arm, especially on one side.  Warmth and redness of the leg or arm, especially on one side.  Pain in an arm or leg. Leg pain may be more noticeable or worse when standing or walking.  PE symptoms can include:  Shortness of breath.  Coughing.  Coughing up blood or blood-tinged mucus (hemoptysis).  Chest pain or chest pain with deep breaths (pleuritic chest pain).  Apprehension, anxiety, or a feeling of impending doom.  Rapid heartbeat. PREVENTION  Exercise regularly. Take a brisk 30 minute walk every day. Staying  active and moving around can help prevent blood clots.  Avoid sitting or lying in bed for long periods of time. Change your position often, especially during a long trip.  Women, especially those over the age of 74, should consider the risks and benefits of taking estrogen medicines. This includes birth control pills and hormone replacement therapy.  Do not smoke, especially if you take estrogen medicines. If you smoke, talk to your caregiver on how to quit.  Eat plenty of fruits and vegetables. Ask your caregiver or dietitian if there are foods you should avoid.  Maintain a weight as suggested by your caregiver.  Wear loose-fitting clothing. Avoid constrictive or tight clothing around your legs or waist.  Try not to bump or injure your legs. Avoid crossing your legs when you are sitting.  Do not use pillows under your knees unless told by your caregiver.  Take all medicines that your caregiver prescribes you.  Wear special stockings (compression stockings or TED hose) if your caregiver prescribes them.  Wearing compression stockings (support hose) can make the leg veins more narrow. This increases blood flow in the legs and can help prevent blood clots.  It is important to wear compression stockings correctly. Do not let them bunch up when you are wearing them. TRAVEL Long distance travel can increase the risk of a blood clot. To prevent a blood clot when traveling:  You should exercise your legs by walking or by pumping your muscles every hour. To help prevent poor  circulation on long trips, stand, stretch, and walk up and down the aisle of your airplane, train, or bus as often as possible to get the blood moving.  Do squats if you are able. If you are unable to do squats, raise your foot on the balls of your feet and tighten your lower leg muscles (particularly the calve muscles) while seated. Pointing (flexing and extending) your toes while tightening your calves while seated are  also good exercises to do every hour during long trips. They help increase blood flow and reduce risk of DVT.  Stay well hydrated. Drink water regularly when traveling, especially when you are sitting or immobile for long periods of time.  Use of drugs to prevent DVT during routine travel is not generally recommended. Before taking any drugs to reduce risk of DVT, consult your caregiver. SURGERY AND HOSPITALIZATION  People who are at high risk for a blood clot may be given a blood thinning medicine (anticoagulant) when they are hospitalized even if they are not going to have surgery.  A long trip prior to surgery can increase the risk of a clot for patients undergoing hip and knee replacements. Talk to your caregiver about travel plans before your surgery.  After hip or knee surgery, your caregiver may give you anticoagulants to help prevent blood clots.  Anticoagulants may be given to people at high risk of developing thromboembolism, before, during, or sometimes after surgery, including people with clotting disorders or with a history of past thromboembolism. TRAVEL AFTER SURGERY  In orthopedic surgery, the cutting of bones prompts the body to increase clotting factors in the blood. Due to the size of the bones involved in hip and knee replacements, there is a higher risk of blood clotting than other orthopedic surgeries.  There is a risk of clotting for up to 4-6 weeks after surgery. Flying or traveling long distances can increase your risk of a clot. As a result, those who travel long distances may need additional preventive measures after their procedure.  Drink only non-alcoholic beverages during your flight, train, or car travel. Alcohol can dehydrate you and increase your risk of getting blood clots. SEEK IMMEDIATE MEDICAL CARE IF:   You develop chest pain.  You develop severe shortness of breath.  You have breathing problems after traveling.  You develop swelling or pain in the  leg.  You begin to cough up bloody mucus or phlegm (sputum).  You feel dizzy or faint. Document Released: 09/06/2009 Document Revised: 06/12/2012 Document Reviewed: 09/06/2009 [facility] Patient Information 2015 [place], [place]. This information is not intended to replace advice given to you by your health care provider. Make sure you discuss any questions you have with your health care provider.   Your evaluated in the ED today for DVT and pulmonary embolism. Your evaluation did not show evidence of either one of these conditions. It is important for you to follow-up with primary care for further evaluation and management of your symptoms. Return to ED for worsening symptoms, fevers, shortness of breath, cough, chest pain

## 2014-10-17 NOTE — Progress Notes (Signed)
VASCULAR LAB PRELIMINARY  PRELIMINARY  PRELIMINARY  PRELIMINARY  Left lower extremity venous Doppler completed.    Preliminary report:  There is no DVT or SVT noted in the left lower extremity.   Deashia Soule, RVT 10/17/2014, 2:55 PM

## 2014-10-17 NOTE — ED Provider Notes (Signed)
CSN: [id number]     Arrival date & time 10/17/14  1114 History   First MD Initiated Contact with Patient 10/17/14 1124     Chief Complaint  Patient presents with  . Leg Swelling    sent here for questionable pulse deficit in LLE following left total knee arthroplasty on Tuesday of this week  . Post-op Problem     (Consider location/radiation/quality/duration/timing/severity/associated sxs/prior Treatment) HPI Debra Wright is a 64 y.o. female with history of hypertension, hyperlipidemia, diabetes and recent total left knee arthroplasty on 1/12 performed by Dr. Ninfa Linden comes in for evaluation of "low oxygen". Patient was working with physical therapy today when physical therapy noticed a questionable pulse deficit in the lower left extremity and that O2 saturations had dropped to 93%. They rechecked on a different monitor and found it to be fluctuating to 89%. At that point they referred her to the ED for further evaluation of PE. Patient denies any fevers, chest pain, shortness of breath or difficulties breathing, numbness or weakness. She does report some mild swelling and erythema around her surgical site. She has been taking Xarelto, but has not taken any today. Denies any history of arrhythmia or other cardiovascular problems.  Past Medical History  Diagnosis Date  . Hypertension   . Depression   . Anxiety   . GERD (gastroesophageal reflux disease)   . High cholesterol   . Type II diabetes mellitus   . Migraine     "frequently when I was younger; maybe monthly now" (10/14/2014)  . Arthritis     "knees; right thumb" (10/14/2014)  . Basal cell carcinoma     "burned off upper lip & right shoulder"   Past Surgical History  Procedure Laterality Date  . Carpal tunnel release Bilateral 1986  . Bilateral oophorectomy  2004  . Cataract extraction w/ intraocular lens  implant, bilateral Bilateral 2011  . Shoulder arthroscopy distal clavicle excision and open rotator cuff repair Right 11/2013   . Total knee arthroplasty Left 10/13/2014    Procedure: LEFT TOTAL KNEE ARTHROPLASTY;  Surgeon: Mcarthur Rossetti, MD;  Location: [facility];  Service: Orthopedics;  Laterality: Left;  . Joint replacement    . Tonsillectomy  1950's  . Appendectomy  1988  . Abdominal hysterectomy  1988   No family history on file. History  Substance Use Topics  . Smoking status: Never Smoker   . Smokeless tobacco: Never Used  . Alcohol Use: Yes     Comment: 10/14/2014 "might have a drink when I'm out once/month"   OB History    No data available     Review of Systems  Constitutional: Negative for fever.  Respiratory: Negative for cough and shortness of breath.   Cardiovascular: Negative for chest pain.  Musculoskeletal: Positive for joint swelling.  All other systems reviewed and are negative.     Allergies  Antivert  Home Medications   Prior to Admission medications   Medication Sig Start Date End Date Taking? Authorizing Provider  amLODipine (NORVASC) 10 MG tablet Take 10 mg by mouth daily.   Yes Historical Provider, MD  Biotin 5000 MCG TABS Take 5,000 mcg by mouth daily.   Yes Historical Provider, MD  calcium carbonate (TUMS - DOSED IN MG ELEMENTAL CALCIUM) 500 MG chewable tablet Chew 2 tablets by mouth daily as needed for indigestion or heartburn.   Yes Historical Provider, MD  Calcium Carbonate-Vitamin D (CALCIUM 600 + D PO) Take 1 tablet by mouth daily.   Yes Historical Provider,  MD  citalopram (CELEXA) 40 MG tablet Take 40 mg by mouth every morning.   Yes Historical Provider, MD  diazepam (VALIUM) 5 MG tablet Take 1 tablet (5 mg total) by mouth every 6 (six) hours as needed (dizziness). 11/22/12  Yes Hoy Morn, MD  eszopiclone (LUNESTA) 2 MG TABS tablet Take 2 mg by mouth at bedtime as needed for sleep. Take immediately before bedtime   Yes Historical Provider, MD  ibuprofen (ADVIL,MOTRIN) 200 MG tablet Take 400 mg by mouth every 6 (six) hours as needed for moderate pain.   Yes  Historical Provider, MD  metFORMIN (GLUCOPHAGE) 1000 MG tablet Take 1,000 mg by mouth 2 (two) times daily with a meal.    Yes Historical Provider, MD  omega-3 acid ethyl esters (LOVAZA) 1 G capsule Take 2 g by mouth 2 (two) times daily.   Yes Historical Provider, MD  omeprazole (PRILOSEC) 40 MG capsule Take 40 mg by mouth every morning.   Yes Historical Provider, MD  oxyCODONE-acetaminophen (ROXICET) 5-325 MG per tablet Take 1-2 tablets by mouth every 4 (four) hours as needed for severe pain. 10/15/14  Yes Erskine Emery, PA-C  pravastatin (PRAVACHOL) 20 MG tablet Take 20 mg by mouth at bedtime.   Yes Historical Provider, MD  rivaroxaban (XARELTO) 10 MG TABS tablet Take 1 tablet (10 mg total) by mouth daily with breakfast. 10/15/14  Yes Erskine Emery, PA-C  methocarbamol (ROBAXIN) 500 MG tablet Take 1 tablet (500 mg total) by mouth every 6 (six) hours as needed for muscle spasms. 10/15/14   Erskine Emery, PA-C   BP 121/54 mmHg  Pulse 79  Temp(Src) 98.5 F (36.9 C) (Oral)  Resp 20  Ht 5\' 3"  (1.6 m)  Wt 214 lb (97.07 kg)  BMI 37.92 kg/m2  SpO2 97% Physical Exam  Constitutional: She is oriented to person, place, and time. She appears well-developed and well-nourished.  HENT:  Head: Normocephalic and atraumatic.  Mouth/Throat: Oropharynx is clear and moist.  Eyes: Conjunctivae are normal. Pupils are equal, round, and reactive to light. Right eye exhibits no discharge. Left eye exhibits no discharge. No scleral icterus.  Neck: Neck supple.  Cardiovascular: Normal rate, regular rhythm and normal heart sounds.   Pulmonary/Chest: Effort normal and breath sounds normal. No respiratory distress. She has no wheezes. She has no rales.  Abdominal: Soft. There is no tenderness.  Musculoskeletal: She exhibits no tenderness.  Mild swelling to left knee. There is horizontal erythema bilaterally in the middle of the laceration repair. Lower extremity does not appear overtly edematous, erythematous. It is  nontender along the deep venous system. Distal pulses are intact and equal bilaterally as determined by Doppler.  Neurological: She is alert and oriented to person, place, and time.  Cranial Nerves II-XII grossly intact  Skin: Skin is warm and dry. No rash noted.  Psychiatric: She has a normal mood and affect.  Nursing note and vitals reviewed.   ED Course  Procedures (including critical care time) Labs Review Labs Reviewed - No data to display  Imaging Review No results found.   EKG Interpretation None     Meds given in ED:  Medications  oxyCODONE-acetaminophen (PERCOCET/ROXICET) 5-325 MG per tablet 2 tablet (2 tablets Oral Given 10/17/14 1330)    Discharge Medication List as of 10/17/2014  3:45 PM     Filed Vitals:   10/17/14 1400 10/17/14 1430 10/17/14 1557 10/17/14 1559  BP: 128/50 137/62 121/54   Pulse:  86 79   Temp:  TempSrc:      Resp:    20  Height:      Weight:      SpO2:  98% 97%     MDM  Vitals stable - WNL -afebrile Pt resting comfortably in ED. >95% on RA PE--no evidence of PE or gross cellulitis on exam.  Imaging--US shows no DVT.  DDX--Well appearing female with recent L knee arthroplasty, with no chest pain, sob, cough. Low concern for PE or other vascular compromise at this time.  Reports she will follow up with her PCP and Surgeon, Dr. Rush Farmer. I discussed all relevant lab findings and imaging results with pt and they verbalized understanding. Discussed f/u with PCP within 48 hrs and return precautions, pt very amenable to plan.  Prior to patient discharge, I discussed and reviewed this case with Dr.Wofford  Final diagnoses:  Status post total left knee replacement       Verl Dicker, PA-C 10/17/14 [place], PA-C 10/17/14 Gratz III, MD 10/19/14 [phone]

## 2014-10-17 NOTE — ED Notes (Signed)
PA Cartner at bedside.  

## 2014-10-17 NOTE — ED Notes (Signed)
Dr. Doy Mince at bedside with the patient and family.

## 2014-10-17 NOTE — ED Notes (Signed)
Vascular paged. 

## 2014-10-18 DIAGNOSIS — E119 Type 2 diabetes mellitus without complications: Secondary | ICD-10-CM | POA: Diagnosis not present

## 2014-10-18 DIAGNOSIS — F329 Major depressive disorder, single episode, unspecified: Secondary | ICD-10-CM | POA: Diagnosis not present

## 2014-10-18 DIAGNOSIS — Z471 Aftercare following joint replacement surgery: Secondary | ICD-10-CM | POA: Diagnosis not present

## 2014-10-18 DIAGNOSIS — I10 Essential (primary) hypertension: Secondary | ICD-10-CM | POA: Diagnosis not present

## 2014-10-18 DIAGNOSIS — M1711 Unilateral primary osteoarthritis, right knee: Secondary | ICD-10-CM | POA: Diagnosis not present

## 2014-10-18 DIAGNOSIS — Z96652 Presence of left artificial knee joint: Secondary | ICD-10-CM | POA: Diagnosis not present

## 2014-10-18 DIAGNOSIS — K219 Gastro-esophageal reflux disease without esophagitis: Secondary | ICD-10-CM | POA: Diagnosis not present

## 2014-10-18 DIAGNOSIS — F419 Anxiety disorder, unspecified: Secondary | ICD-10-CM | POA: Diagnosis not present

## 2014-10-18 LAB — URINE CULTURE: SPECIAL REQUESTS: NORMAL

## 2014-10-19 DIAGNOSIS — K219 Gastro-esophageal reflux disease without esophagitis: Secondary | ICD-10-CM | POA: Diagnosis not present

## 2014-10-19 DIAGNOSIS — I10 Essential (primary) hypertension: Secondary | ICD-10-CM | POA: Diagnosis not present

## 2014-10-19 DIAGNOSIS — F329 Major depressive disorder, single episode, unspecified: Secondary | ICD-10-CM | POA: Diagnosis not present

## 2014-10-19 DIAGNOSIS — Z471 Aftercare following joint replacement surgery: Secondary | ICD-10-CM | POA: Diagnosis not present

## 2014-10-19 DIAGNOSIS — E119 Type 2 diabetes mellitus without complications: Secondary | ICD-10-CM | POA: Diagnosis not present

## 2014-10-19 DIAGNOSIS — M1711 Unilateral primary osteoarthritis, right knee: Secondary | ICD-10-CM | POA: Diagnosis not present

## 2014-10-20 DIAGNOSIS — I10 Essential (primary) hypertension: Secondary | ICD-10-CM | POA: Diagnosis not present

## 2014-10-20 DIAGNOSIS — F329 Major depressive disorder, single episode, unspecified: Secondary | ICD-10-CM | POA: Diagnosis not present

## 2014-10-20 DIAGNOSIS — Z471 Aftercare following joint replacement surgery: Secondary | ICD-10-CM | POA: Diagnosis not present

## 2014-10-20 DIAGNOSIS — E119 Type 2 diabetes mellitus without complications: Secondary | ICD-10-CM | POA: Diagnosis not present

## 2014-10-20 DIAGNOSIS — M1711 Unilateral primary osteoarthritis, right knee: Secondary | ICD-10-CM | POA: Diagnosis not present

## 2014-10-20 DIAGNOSIS — K219 Gastro-esophageal reflux disease without esophagitis: Secondary | ICD-10-CM | POA: Diagnosis not present

## 2014-10-22 DIAGNOSIS — K219 Gastro-esophageal reflux disease without esophagitis: Secondary | ICD-10-CM | POA: Diagnosis not present

## 2014-10-22 DIAGNOSIS — E119 Type 2 diabetes mellitus without complications: Secondary | ICD-10-CM | POA: Diagnosis not present

## 2014-10-22 DIAGNOSIS — F329 Major depressive disorder, single episode, unspecified: Secondary | ICD-10-CM | POA: Diagnosis not present

## 2014-10-22 DIAGNOSIS — I10 Essential (primary) hypertension: Secondary | ICD-10-CM | POA: Diagnosis not present

## 2014-10-22 DIAGNOSIS — Z471 Aftercare following joint replacement surgery: Secondary | ICD-10-CM | POA: Diagnosis not present

## 2014-10-22 DIAGNOSIS — M1711 Unilateral primary osteoarthritis, right knee: Secondary | ICD-10-CM | POA: Diagnosis not present

## 2014-10-26 DIAGNOSIS — Z471 Aftercare following joint replacement surgery: Secondary | ICD-10-CM | POA: Diagnosis not present

## 2014-10-26 DIAGNOSIS — E119 Type 2 diabetes mellitus without complications: Secondary | ICD-10-CM | POA: Diagnosis not present

## 2014-10-26 DIAGNOSIS — K219 Gastro-esophageal reflux disease without esophagitis: Secondary | ICD-10-CM | POA: Diagnosis not present

## 2014-10-26 DIAGNOSIS — I10 Essential (primary) hypertension: Secondary | ICD-10-CM | POA: Diagnosis not present

## 2014-10-26 DIAGNOSIS — M1711 Unilateral primary osteoarthritis, right knee: Secondary | ICD-10-CM | POA: Diagnosis not present

## 2014-10-26 DIAGNOSIS — F329 Major depressive disorder, single episode, unspecified: Secondary | ICD-10-CM | POA: Diagnosis not present

## 2014-10-27 DIAGNOSIS — M1712 Unilateral primary osteoarthritis, left knee: Secondary | ICD-10-CM | POA: Diagnosis not present

## 2014-10-28 DIAGNOSIS — I10 Essential (primary) hypertension: Secondary | ICD-10-CM | POA: Diagnosis not present

## 2014-10-28 DIAGNOSIS — Z471 Aftercare following joint replacement surgery: Secondary | ICD-10-CM | POA: Diagnosis not present

## 2014-10-28 DIAGNOSIS — E119 Type 2 diabetes mellitus without complications: Secondary | ICD-10-CM | POA: Diagnosis not present

## 2014-10-28 DIAGNOSIS — K219 Gastro-esophageal reflux disease without esophagitis: Secondary | ICD-10-CM | POA: Diagnosis not present

## 2014-10-28 DIAGNOSIS — F329 Major depressive disorder, single episode, unspecified: Secondary | ICD-10-CM | POA: Diagnosis not present

## 2014-10-28 DIAGNOSIS — M1711 Unilateral primary osteoarthritis, right knee: Secondary | ICD-10-CM | POA: Diagnosis not present

## 2014-10-30 DIAGNOSIS — I10 Essential (primary) hypertension: Secondary | ICD-10-CM | POA: Diagnosis not present

## 2014-10-30 DIAGNOSIS — M1711 Unilateral primary osteoarthritis, right knee: Secondary | ICD-10-CM | POA: Diagnosis not present

## 2014-10-30 DIAGNOSIS — E119 Type 2 diabetes mellitus without complications: Secondary | ICD-10-CM | POA: Diagnosis not present

## 2014-10-30 DIAGNOSIS — F329 Major depressive disorder, single episode, unspecified: Secondary | ICD-10-CM | POA: Diagnosis not present

## 2014-10-30 DIAGNOSIS — Z471 Aftercare following joint replacement surgery: Secondary | ICD-10-CM | POA: Diagnosis not present

## 2014-10-30 DIAGNOSIS — K219 Gastro-esophageal reflux disease without esophagitis: Secondary | ICD-10-CM | POA: Diagnosis not present

## 2014-11-02 DIAGNOSIS — K219 Gastro-esophageal reflux disease without esophagitis: Secondary | ICD-10-CM | POA: Diagnosis not present

## 2014-11-02 DIAGNOSIS — M1711 Unilateral primary osteoarthritis, right knee: Secondary | ICD-10-CM | POA: Diagnosis not present

## 2014-11-02 DIAGNOSIS — F329 Major depressive disorder, single episode, unspecified: Secondary | ICD-10-CM | POA: Diagnosis not present

## 2014-11-02 DIAGNOSIS — Z471 Aftercare following joint replacement surgery: Secondary | ICD-10-CM | POA: Diagnosis not present

## 2014-11-02 DIAGNOSIS — I10 Essential (primary) hypertension: Secondary | ICD-10-CM | POA: Diagnosis not present

## 2014-11-02 DIAGNOSIS — E119 Type 2 diabetes mellitus without complications: Secondary | ICD-10-CM | POA: Diagnosis not present

## 2014-11-04 DIAGNOSIS — Z471 Aftercare following joint replacement surgery: Secondary | ICD-10-CM | POA: Diagnosis not present

## 2014-11-04 DIAGNOSIS — M1711 Unilateral primary osteoarthritis, right knee: Secondary | ICD-10-CM | POA: Diagnosis not present

## 2014-11-04 DIAGNOSIS — I10 Essential (primary) hypertension: Secondary | ICD-10-CM | POA: Diagnosis not present

## 2014-11-04 DIAGNOSIS — F329 Major depressive disorder, single episode, unspecified: Secondary | ICD-10-CM | POA: Diagnosis not present

## 2014-11-04 DIAGNOSIS — K219 Gastro-esophageal reflux disease without esophagitis: Secondary | ICD-10-CM | POA: Diagnosis not present

## 2014-11-04 DIAGNOSIS — E119 Type 2 diabetes mellitus without complications: Secondary | ICD-10-CM | POA: Diagnosis not present

## 2014-11-05 ENCOUNTER — Ambulatory Visit: Payer: Medicare Other | Admitting: Physical Therapy

## 2014-11-09 ENCOUNTER — Ambulatory Visit: Payer: Medicare Other | Attending: Orthopaedic Surgery | Admitting: Physical Therapy

## 2014-11-09 DIAGNOSIS — Z96652 Presence of left artificial knee joint: Secondary | ICD-10-CM | POA: Insufficient documentation

## 2014-11-09 DIAGNOSIS — M25562 Pain in left knee: Secondary | ICD-10-CM | POA: Insufficient documentation

## 2014-11-12 ENCOUNTER — Ambulatory Visit: Payer: Medicare Other | Admitting: Physical Therapy

## 2014-11-12 DIAGNOSIS — E1165 Type 2 diabetes mellitus with hyperglycemia: Secondary | ICD-10-CM | POA: Diagnosis not present

## 2014-11-12 DIAGNOSIS — M25562 Pain in left knee: Secondary | ICD-10-CM | POA: Diagnosis not present

## 2014-11-12 DIAGNOSIS — Z96652 Presence of left artificial knee joint: Secondary | ICD-10-CM | POA: Diagnosis not present

## 2014-11-12 DIAGNOSIS — E119 Type 2 diabetes mellitus without complications: Secondary | ICD-10-CM | POA: Diagnosis not present

## 2014-11-12 DIAGNOSIS — Z1212 Encounter for screening for malignant neoplasm of rectum: Secondary | ICD-10-CM | POA: Diagnosis not present

## 2014-11-13 DIAGNOSIS — D649 Anemia, unspecified: Secondary | ICD-10-CM | POA: Diagnosis not present

## 2014-11-13 DIAGNOSIS — E538 Deficiency of other specified B group vitamins: Secondary | ICD-10-CM | POA: Diagnosis not present

## 2014-11-16 ENCOUNTER — Ambulatory Visit: Payer: Medicare Other | Admitting: Physical Therapy

## 2014-11-17 DIAGNOSIS — E538 Deficiency of other specified B group vitamins: Secondary | ICD-10-CM | POA: Diagnosis not present

## 2014-11-17 DIAGNOSIS — E1165 Type 2 diabetes mellitus with hyperglycemia: Secondary | ICD-10-CM | POA: Diagnosis not present

## 2014-11-17 DIAGNOSIS — Z1389 Encounter for screening for other disorder: Secondary | ICD-10-CM | POA: Diagnosis not present

## 2014-11-17 DIAGNOSIS — M1990 Unspecified osteoarthritis, unspecified site: Secondary | ICD-10-CM | POA: Diagnosis not present

## 2014-11-17 DIAGNOSIS — Z8249 Family history of ischemic heart disease and other diseases of the circulatory system: Secondary | ICD-10-CM | POA: Diagnosis not present

## 2014-11-17 DIAGNOSIS — D649 Anemia, unspecified: Secondary | ICD-10-CM | POA: Diagnosis not present

## 2014-11-17 DIAGNOSIS — Z6837 Body mass index (BMI) 37.0-37.9, adult: Secondary | ICD-10-CM | POA: Diagnosis not present

## 2014-11-17 DIAGNOSIS — I10 Essential (primary) hypertension: Secondary | ICD-10-CM | POA: Diagnosis not present

## 2014-11-17 DIAGNOSIS — K219 Gastro-esophageal reflux disease without esophagitis: Secondary | ICD-10-CM | POA: Diagnosis not present

## 2014-11-17 DIAGNOSIS — Z0000 Encounter for general adult medical examination without abnormal findings: Secondary | ICD-10-CM | POA: Diagnosis not present

## 2014-11-17 DIAGNOSIS — E785 Hyperlipidemia, unspecified: Secondary | ICD-10-CM | POA: Diagnosis not present

## 2014-11-17 DIAGNOSIS — Z9889 Other specified postprocedural states: Secondary | ICD-10-CM | POA: Diagnosis not present

## 2014-11-19 ENCOUNTER — Ambulatory Visit: Payer: Medicare Other | Admitting: Physical Therapy

## 2014-11-19 ENCOUNTER — Encounter: Payer: Self-pay | Admitting: Physical Therapy

## 2014-11-19 DIAGNOSIS — M25562 Pain in left knee: Secondary | ICD-10-CM | POA: Diagnosis not present

## 2014-11-19 DIAGNOSIS — Z96652 Presence of left artificial knee joint: Secondary | ICD-10-CM | POA: Diagnosis not present

## 2014-11-19 NOTE — Therapy (Signed)
[facility] [facility] [address] [place], [place], [address] Phone: [phone]   Fax:  [phone]  Physical Therapy Treatment  Patient Details  Name: Debra Wright MRN: [id number] Date of Birth: [birth year] Referring Provider:  Mcarthur Rossetti*  Encounter Date: 11/19/2014      PT End of Session - 11/19/14 1635    Visit Number 3   Date for PT Re-Evaluation 01/08/15   PT Start Time 1537   PT Stop Time 1650   PT Time Calculation (min) 73 min      Past Medical History  Diagnosis Date  . Hypertension   . Depression   . Anxiety   . GERD (gastroesophageal reflux disease)   . High cholesterol   . Type II diabetes mellitus   . Migraine     "frequently when I was younger; maybe monthly now" (10/14/2014)  . Arthritis     "knees; right thumb" (10/14/2014)  . Basal cell carcinoma     "burned off upper lip & right shoulder"    Past Surgical History  Procedure Laterality Date  . Carpal tunnel release Bilateral 1986  . Bilateral oophorectomy  2004  . Cataract extraction w/ intraocular lens  implant, bilateral Bilateral 2011  . Shoulder arthroscopy distal clavicle excision and open rotator cuff repair Right 11/2013  . Total knee arthroplasty Left 10/13/2014    Procedure: LEFT TOTAL KNEE ARTHROPLASTY;  Surgeon: Mcarthur Rossetti, MD;  Location: [facility];  Service: Orthopedics;  Laterality: Left;  . Joint replacement    . Tonsillectomy  1950's  . Appendectomy  1988  . Abdominal hysterectomy  1988    There were no vitals taken for this visit.  Visit Diagnosis:  Left knee pain      Subjective Assessment - 11/19/14 1551    Symptoms doing pretty well, HEP is going good   Pain Score 3    Pain Location Knee   Pain Orientation Left   Pain Type Surgical pain          [facility] PT Assessment - 11/19/14 0001    AROM   Left Knee Extension 3   Left Knee Flexion 100                  [facility] Adult PT  Treatment/Exercise - 11/19/14 0001    Ambulation/Gait   Ambulation/Gait Yes   Ambulation/Gait Assistance 6: Modified independent (Device/Increase time)   Ambulation Distance (Feet) 200 Feet   Gait Pattern Step-to pattern;Decreased step length - left   Ambulation Surface Level;Indoor   Gait Comments worked on heel striek and Left knee swing VCing needed   Knee/Hip Exercises: Aerobic   Stationary Bike 5 min, rocking motion , unable ot get all the way around   Elliptical Nustep L4 6 min   Knee/Hip Exercises: Machines for Strengthening   Cybex Knee Extension 5# 2 sets 10   Cybex Knee Flexion 15# 2 sets 10   Cybex Leg Press 20# 2 sets 10   Total Gym Leg Press calf raises 20# 2 sets 10                  PT Short Term Goals - 11/19/14 1642    PT SHORT TERM GOAL #1   Title I initial HEP   Status Achieved   PT SHORT TERM GOAL #2   Status Achieved   PT SHORT TERM GOAL #3   Title advanced HEP   Additional Short Term Goals  Additional Short Term Goals Yes           PT Long Term Goals - 11/19/14 1646    PT LONG TERM GOAL #1   Title RICE   Status Achieved   PT LONG TERM GOAL #2   Title advanced HEP   PT LONG TERM GOAL #3   Title pain decrease 50%   PT LONG TERM GOAL #4   Title increase ROM 5-100   PT LONG TERM GOAL #5   Title walk all community distances without AD               Plan - 11/19/14 1638    Clinical Impression Statement pt with improved gait after cuing, pt with improved ROM and tolerating ther ex well        Problem List Patient Active Problem List   Diagnosis Date Noted  . Arthritis of left knee 10/13/2014  . Status post total left knee replacement 10/13/2014    PAYSEUR,ANGIE, PTA 11/19/2014, 4:49 PM  [facility] [address] [place], [place], [address] Phone: [phone]   Fax:  [phone]

## 2014-11-24 ENCOUNTER — Ambulatory Visit: Payer: Medicare Other | Admitting: Physical Therapy

## 2014-11-24 ENCOUNTER — Encounter: Payer: Self-pay | Admitting: Physical Therapy

## 2014-11-24 DIAGNOSIS — M25562 Pain in left knee: Secondary | ICD-10-CM | POA: Diagnosis not present

## 2014-11-24 DIAGNOSIS — Z96652 Presence of left artificial knee joint: Secondary | ICD-10-CM | POA: Diagnosis not present

## 2014-11-24 NOTE — Therapy (Signed)
[facility] [address] [place], [place], [address] Phone: [phone]   Fax:  [phone]  Physical Therapy Treatment  Patient Details  Name: Debra Wright MRN: [id number] Date of Birth: 1951-08-14 Referring Provider:  Mcarthur Rossetti*  Encounter Date: 11/24/2014      PT End of Session - 11/24/14 1658    Visit Number 4   Number of Visits 16   Date for PT Re-Evaluation 01/08/15   PT Start Time 3151   PT Stop Time 1657   PT Time Calculation (min) 82 min   Activity Tolerance Patient tolerated treatment well      Past Medical History  Diagnosis Date  . Hypertension   . Depression   . Anxiety   . GERD (gastroesophageal reflux disease)   . High cholesterol   . Type II diabetes mellitus   . Migraine     "frequently when I was younger; maybe monthly now" (10/14/2014)  . Arthritis     "knees; right thumb" (10/14/2014)  . Basal cell carcinoma     "burned off upper lip & right shoulder"    Past Surgical History  Procedure Laterality Date  . Carpal tunnel release Bilateral 1986  . Bilateral oophorectomy  2004  . Cataract extraction w/ intraocular lens  implant, bilateral Bilateral 2011  . Shoulder arthroscopy distal clavicle excision and open rotator cuff repair Right 11/2013  . Total knee arthroplasty Left 10/13/2014    Procedure: LEFT TOTAL KNEE ARTHROPLASTY;  Surgeon: Mcarthur Rossetti, MD;  Location: [facility];  Service: Orthopedics;  Laterality: Left;  . Joint replacement    . Tonsillectomy  1950's  . Appendectomy  1988  . Abdominal hysterectomy  1988    There were no vitals taken for this visit.  Visit Diagnosis:  Left knee pain      Subjective Assessment - 11/24/14 1539    Symptoms doing good. extra sore today.   Currently in Pain? Yes   Pain Score 4    Pain Location Knee   Pain Orientation Left   Pain Descriptors / Indicators Sore   Pain Type Surgical pain   Pain Onset More than a month  ago   Pain Frequency Constant   Multiple Pain Sites No          [facility] PT Assessment - 11/24/14 0001    ROM / Strength   AROM / PROM / Strength AROM;Strength   AROM   AROM Assessment Site Knee   Right/Left Knee Left   Left Knee Extension 1   Left Knee Flexion 92   Strength   Strength Assessment Site Knee   Right/Left Knee Left   Left Knee Flexion 3+/5   Left Knee Extension 4+/5                  [facility] Adult PT Treatment/Exercise - 11/24/14 0001    Exercises   Exercises Knee/Hip   Knee/Hip Exercises: Aerobic   Stationary Bike 6 minutes  full revolutions last min; hip hike   Elliptical nustep  level 5 x 6 min   Knee/Hip Exercises: Machines for Strengthening   Cybex Knee Extension 10#  2x10   Cybex Knee Flexion 25#  2x15   Cybex Leg Press 30#  2x10   Modalities   Modalities Cryotherapy;Electrical Stimulation   Cryotherapy   Number Minutes Cryotherapy 15 Minutes   Cryotherapy Location Knee   Type of Cryotherapy Other (comment)  vaso   Electrical Stimulation  Electrical Stimulation Location left knee   Electrical Stimulation Parameters IFC   Electrical Stimulation Goals Pain   Manual Therapy   Manual Therapy Passive ROM                  PT Short Term Goals - 11/19/14 1642    PT SHORT TERM GOAL #1   Title I initial HEP   Status Achieved   PT SHORT TERM GOAL #2   Status Achieved   PT SHORT TERM GOAL #3   Title advanced HEP   Additional Short Term Goals   Additional Short Term Goals Yes           PT Long Term Goals - 11/24/14 1646    PT LONG TERM GOAL #1   Title RICE   Status Achieved   PT LONG TERM GOAL #2   Title advanced HEP   PT LONG TERM GOAL #3   Title pain decrease 50%   PT LONG TERM GOAL #4   Title increase ROM 5-100   PT LONG TERM GOAL #5   Title walk all community distances without AD   Status Achieved               Plan - 11/24/14 1647    Clinical Impression Statement Pitting edema down LE into foot.   Decreased flexion today.  Decreased stride length with gait.   Pt will benefit from skilled therapeutic intervention in order to improve on the following deficits Abnormal gait;Decreased activity tolerance;Decreased endurance;Decreased mobility;Decreased range of motion;Decreased strength;Decreased scar mobility;Difficulty walking;Increased edema;Pain   Rehab Potential Good   PT Frequency 2x / week   PT Duration 8 weeks   PT Next Visit Plan Continue to strengthen and work ROM.  Gait training.   Consulted and Agree with Plan of Care Patient        Problem List Patient Active Problem List   Diagnosis Date Noted  . Arthritis of left knee 10/13/2014  . Status post total left knee replacement 10/13/2014    Linzey Ramser  PTA 11/24/2014, 5:04 PM  [facility] [address] [place], [place], [address] Phone: [phone]   Fax:  [phone]

## 2014-11-26 ENCOUNTER — Ambulatory Visit: Payer: Medicare Other

## 2014-11-26 ENCOUNTER — Ambulatory Visit: Payer: Medicare Other | Admitting: Physical Therapy

## 2014-12-01 ENCOUNTER — Encounter: Payer: Self-pay | Admitting: Physical Therapy

## 2014-12-01 ENCOUNTER — Ambulatory Visit: Payer: Medicare Other | Attending: Orthopaedic Surgery | Admitting: Physical Therapy

## 2014-12-01 DIAGNOSIS — Z96652 Presence of left artificial knee joint: Secondary | ICD-10-CM | POA: Diagnosis not present

## 2014-12-01 DIAGNOSIS — M25562 Pain in left knee: Secondary | ICD-10-CM

## 2014-12-01 NOTE — Therapy (Signed)
[address] [place], [place], [address] Phone: [phone]   Fax:  [phone]  Physical Therapy Treatment  Patient Details  Name: Debra Wright MRN: [id number] Date of Birth: [birth year] Referring Provider:  Mcarthur Rossetti*  Encounter Date: 12/01/2014      PT End of Session - 12/01/14 1704    Visit Number 5   Number of Visits 16   Date for PT Re-Evaluation 01/08/15   PT Start Time 7824   PT Stop Time 1711   PT Time Calculation (min) 79 min      Past Medical History  Diagnosis Date  . Hypertension   . Depression   . Anxiety   . GERD (gastroesophageal reflux disease)   . High cholesterol   . Type II diabetes mellitus   . Migraine     "frequently when I was younger; maybe monthly now" (10/14/2014)  . Arthritis     "knees; right thumb" (10/14/2014)  . Basal cell carcinoma     "burned off upper lip & right shoulder"    Past Surgical History  Procedure Laterality Date  . Carpal tunnel release Bilateral 1986  . Bilateral oophorectomy  2004  . Cataract extraction w/ intraocular lens  implant, bilateral Bilateral 2011  . Shoulder arthroscopy distal clavicle excision and open rotator cuff repair Right 11/2013  . Total knee arthroplasty Left 10/13/2014    Procedure: LEFT TOTAL KNEE ARTHROPLASTY;  Surgeon: Mcarthur Rossetti, MD;  Location: [facility];  Service: Orthopedics;  Laterality: Left;  . Joint replacement    . Tonsillectomy  1950's  . Appendectomy  1988  . Abdominal hysterectomy  1988    There were no vitals taken for this visit.  Visit Diagnosis:  Left knee pain      Subjective Assessment - 12/01/14 1553    Symptoms Feeling pretty good.   Pain Score 3    Pain Location Knee   Pain Orientation Left   Pain Descriptors / Indicators Aching;Sore   Pain Type Surgical pain   Pain Onset More than a month ago   Pain Frequency Constant          [facility] PT Assessment - 12/01/14 0001    ROM / Strength   AROM / PROM / Strength AROM   AROM   AROM Assessment Site Knee   Right/Left Knee Left   Left Knee Extension 2   Left Knee Flexion 85                  [facility] Adult PT Treatment/Exercise - 12/01/14 0001    Ambulation/Gait   Ambulation/Gait Yes   Ambulation/Gait Assistance 7: Independent   Ambulation Distance (Feet) 200 Feet   Gait Pattern Step-through pattern   Ambulation Surface Level;Indoor   Stairs Yes   Stairs Assistance 5: Supervision   Stair Management Technique One rail Right   Number of Stairs 10   Balance   Balance Assessed Yes   Static Standing Balance   Single Leg Stance - Left Leg 5  attempts at 10 seconds   Tandem Stance - Right Leg 5  attempts at 10 seconds   Tandem Stance - Left Leg 5  attempts at 10 seconds   High Level Balance   High Level Balance Activities Backward walking;Other (comment)   High Level Balance Comments ball toss on foam   Exercises   Exercises Knee/Hip   Knee/Hip Exercises: Aerobic   Stationary Bike 6 minutes   Elliptical  6 minutes  Nustep level 5   Knee/Hip Exercises: Machines for Strengthening   Cybex Knee Extension 10#  2x15   Cybex Knee Flexion 25#  2x15   Knee/Hip Exercises: Standing   Walking with Sports Cord 100 ft  in hallway   Knee/Hip Exercises: Seated   Stool Scoot - Round Trips 1   Modalities   Modalities Cryotherapy;Electrical Stimulation   Cryotherapy   Number Minutes Cryotherapy 15 Minutes   Cryotherapy Location Knee   Type of Cryotherapy Other (comment)  vaso   Electrical Stimulation   Electrical Stimulation Location left knee   Electrical Stimulation Parameters IFC   Electrical Stimulation Goals Pain;Edema   Manual Therapy   Manual Therapy Passive ROM   Passive ROM left knee flexion                  PT Short Term Goals - 11/19/14 1642    PT SHORT TERM GOAL #1   Title I initial HEP   Status Achieved   PT SHORT TERM GOAL #2   Status Achieved   PT SHORT TERM GOAL  #3   Title advanced HEP   Additional Short Term Goals   Additional Short Term Goals Yes           PT Long Term Goals - 12/01/14 1614    PT LONG TERM GOAL #1   Title RICE   Time 8   Period Weeks   Status Achieved   PT LONG TERM GOAL #2   Title advanced HEP   Time 8   Period Weeks   Status On-going   PT LONG TERM GOAL #3   Title pain decrease 50%   Time 8   Period Weeks   Status On-going   PT LONG TERM GOAL #4   Title increase ROM 5-100   Time 8   Period Weeks   Status On-going   PT LONG TERM GOAL #5   Title walk all community distances without AD   Time 8   Period Weeks   Status Achieved               Plan - 12/01/14 1659    Clinical Impression Statement Decreased knee flex producing decreased toe off during gait.  Unable to go down stairs reciprocally due to pain.  Decreased balance.  ROM is decreased today, but able to achieve AROM to 108 degrees flexion after exercise and PROM.   Pt will benefit from skilled therapeutic intervention in order to improve on the following deficits Abnormal gait;Decreased activity tolerance;Decreased endurance;Decreased mobility;Decreased range of motion;Decreased strength;Decreased scar mobility;Difficulty walking;Increased edema;Pain;Decreased balance   Rehab Potential Good   PT Frequency 2x / week   PT Duration 8 weeks   PT Next Visit Plan Continue to strengthen, work on balance, and ROM.   Consulted and Agree with Plan of Care Patient        Problem List Patient Active Problem List   Diagnosis Date Noted  . Arthritis of left knee 10/13/2014  . Status post total left knee replacement 10/13/2014    Windle Huebert PTA 12/01/2014, 5:13 PM  [address] [place], [place], [address] Phone: [phone]   Fax:  [phone]

## 2014-12-03 ENCOUNTER — Encounter: Payer: Self-pay | Admitting: Physical Therapy

## 2014-12-03 ENCOUNTER — Ambulatory Visit: Payer: Medicare Other | Admitting: Physical Therapy

## 2014-12-03 DIAGNOSIS — M25562 Pain in left knee: Secondary | ICD-10-CM | POA: Diagnosis not present

## 2014-12-03 DIAGNOSIS — M7989 Other specified soft tissue disorders: Secondary | ICD-10-CM

## 2014-12-03 DIAGNOSIS — Z96652 Presence of left artificial knee joint: Secondary | ICD-10-CM | POA: Diagnosis not present

## 2014-12-03 NOTE — Therapy (Signed)
[address] [place], [place], [address] Phone: [phone]   Fax:  [phone]  Physical Therapy Treatment  Patient Details  Name: Debra Wright MRN: [id number] Date of Birth: 09-08-1951 Referring Provider:  Mcarthur Rossetti*  Encounter Date: 12/03/2014      PT End of Session - 12/03/14 1726    Visit Number 6   Number of Visits 16   Date for PT Re-Evaluation 01/08/15   PT Start Time 7741   PT Stop Time 2878   PT Time Calculation (min) 50 min   Activity Tolerance Patient tolerated treatment well      Past Medical History  Diagnosis Date  . Hypertension   . Depression   . Anxiety   . GERD (gastroesophageal reflux disease)   . High cholesterol   . Type II diabetes mellitus   . Migraine     "frequently when I was younger; maybe monthly now" (10/14/2014)  . Arthritis     "knees; right thumb" (10/14/2014)  . Basal cell carcinoma     "burned off upper lip & right shoulder"    Past Surgical History  Procedure Laterality Date  . Carpal tunnel release Bilateral 1986  . Bilateral oophorectomy  2004  . Cataract extraction w/ intraocular lens  implant, bilateral Bilateral 2011  . Shoulder arthroscopy distal clavicle excision and open rotator cuff repair Right 11/2013  . Total knee arthroplasty Left 10/13/2014    Procedure: LEFT TOTAL KNEE ARTHROPLASTY;  Surgeon: Mcarthur Rossetti, MD;  Location: [facility];  Service: Orthopedics;  Laterality: Left;  . Joint replacement    . Tonsillectomy  1950's  . Appendectomy  1988  . Abdominal hysterectomy  1988    There were no vitals taken for this visit.  Visit Diagnosis:  Left knee pain  Swelling of limb      Subjective Assessment - 12/03/14 1655    Symptoms I fell this morning.  I was walking the dog and it jerked me and I fell today, I am sore and stiff now   Limitations Standing;Walking   How long can you stand comfortably? 5 mins   How long can you  walk comfortably? 10 mins   Patient Stated Goals no pain and walk normally   Currently in Pain? Yes   Pain Score 6    Pain Location Knee   Pain Orientation Left   Pain Descriptors / Indicators Aching;Sore   Pain Type Surgical pain   Pain Onset More than a month ago   Pain Frequency Constant   Aggravating Factors  the fall today really set me back with pain   Pain Relieving Factors rest and ice                    [facility] Adult PT Treatment/Exercise - 12/03/14 0001    Knee/Hip Exercises: Aerobic   Stationary Bike 6 minutes   Elliptical Nustep Level 5 x 6 minutes   Cryotherapy   Number Minutes Cryotherapy 15 Minutes   Cryotherapy Location Knee   Type of Cryotherapy Other (comment)   Electrical Stimulation   Electrical Stimulation Location left knee   Electrical Stimulation Parameters IFC   Electrical Stimulation Goals Pain;Edema   Ultrasound   Ultrasound Location left knee   Ultrasound Parameters 100% 1MHz   Ultrasound Goals Edema;Pain   Manual Therapy   Manual Therapy Passive ROM   Passive ROM left knee flexion and extension  PT Short Term Goals - 11/19/14 1642    PT SHORT TERM GOAL #1   Title I initial HEP   Status Achieved   PT SHORT TERM GOAL #2   Status Achieved   PT SHORT TERM GOAL #3   Title advanced HEP   Additional Short Term Goals   Additional Short Term Goals Yes           PT Long Term Goals - 12/01/14 1614    PT LONG TERM GOAL #1   Title RICE   Time 8   Period Weeks   Status Achieved   PT LONG TERM GOAL #2   Title advanced HEP   Time 8   Period Weeks   Status On-going   PT LONG TERM GOAL #3   Title pain decrease 50%   Time 8   Period Weeks   Status On-going   PT LONG TERM GOAL #4   Title increase ROM 5-100   Time 8   Period Weeks   Status On-going   PT LONG TERM GOAL #5   Title walk all community distances without AD   Time 8   Period Weeks   Status Achieved               Problem  List Patient Active Problem List   Diagnosis Date Noted  . Arthritis of left knee 10/13/2014  . Status post total left knee replacement 10/13/2014    Sumner Boast, PT 12/03/2014, 5:30 PM  [place] [address] [place], [place], [address] Phone: [phone]   Fax:  [phone]

## 2014-12-08 ENCOUNTER — Encounter: Payer: Self-pay | Admitting: Physical Therapy

## 2014-12-08 ENCOUNTER — Ambulatory Visit: Payer: Medicare Other | Admitting: Physical Therapy

## 2014-12-08 DIAGNOSIS — M7989 Other specified soft tissue disorders: Secondary | ICD-10-CM

## 2014-12-08 DIAGNOSIS — Z96652 Presence of left artificial knee joint: Secondary | ICD-10-CM | POA: Diagnosis not present

## 2014-12-08 DIAGNOSIS — M25562 Pain in left knee: Secondary | ICD-10-CM

## 2014-12-08 NOTE — Therapy (Signed)
[address] [place], [place], [address] Phone: [phone]   Fax:  [phone]  Physical Therapy Treatment  Patient Details  Name: Debra Wright MRN: [id number] Date of Birth: 1951/06/21 Referring Provider:  Mcarthur Rossetti*  Encounter Date: 12/08/2014      PT End of Session - 12/08/14 1049    Visit Number 7   Number of Visits 16   Date for PT Re-Evaluation 01/08/15   PT Start Time 1005   PT Stop Time 1107   PT Time Calculation (min) 62 min      Past Medical History  Diagnosis Date  . Hypertension   . Depression   . Anxiety   . GERD (gastroesophageal reflux disease)   . High cholesterol   . Type II diabetes mellitus   . Migraine     "frequently when I was younger; maybe monthly now" (10/14/2014)  . Arthritis     "knees; right thumb" (10/14/2014)  . Basal cell carcinoma     "burned off upper lip & right shoulder"    Past Surgical History  Procedure Laterality Date  . Carpal tunnel release Bilateral 1986  . Bilateral oophorectomy  2004  . Cataract extraction w/ intraocular lens  implant, bilateral Bilateral 2011  . Shoulder arthroscopy distal clavicle excision and open rotator cuff repair Right 11/2013  . Total knee arthroplasty Left 10/13/2014    Procedure: LEFT TOTAL KNEE ARTHROPLASTY;  Surgeon: Mcarthur Rossetti, MD;  Location: [facility];  Service: Orthopedics;  Laterality: Left;  . Joint replacement    . Tonsillectomy  1950's  . Appendectomy  1988  . Abdominal hysterectomy  1988    There were no vitals taken for this visit.  Visit Diagnosis:  Left knee pain  Swelling of limb      Subjective Assessment - 12/08/14 1008    Symptoms After my fall I was pretty sore, still stiff and some heat and soreness.   Limitations Standing;Walking   How long can you stand comfortably? 5 mins   How long can you walk comfortably? 10 mins   Patient Stated Goals no pain and walk normally   Currently in Pain? Yes   Pain Location Knee   Pain Orientation Left   Pain Descriptors / Indicators Aching;Sore   Pain Type Surgical pain   Pain Onset More than a month ago   Pain Frequency Constant   Aggravating Factors  swelling and being up on it has caused more pain   Pain Relieving Factors rest and ice          [facility] PT Assessment - 12/08/14 0001    AROM   AROM Assessment Site Knee   Right/Left Knee Left   Left Knee Extension 2   Left Knee Flexion 93   Strength   Left Knee Flexion 4/5   Left Knee Extension 4+/5                  [facility] Adult PT Treatment/Exercise - 12/08/14 0001    Knee/Hip Exercises: Aerobic   Stationary Bike 6 minutes   Elliptical Nustep Level 6 x 6 minutes   Knee/Hip Exercises: Machines for Strengthening   Cybex Knee Extension 10#   Cybex Knee Flexion 25#   Cybex Leg Press 30#   Total Gym Leg Press calf raises 20# 2 sets 10   Manual Therapy   Manual Therapy Passive ROM;Myofascial release   Myofascial Release scar mobilization   Passive ROM left  knee flexion and extension                  PT Short Term Goals - 11/19/14 1642    PT SHORT TERM GOAL #1   Title I initial HEP   Status Achieved   PT SHORT TERM GOAL #2   Status Achieved   PT SHORT TERM GOAL #3   Title advanced HEP   Additional Short Term Goals   Additional Short Term Goals Yes           PT Long Term Goals - 12/01/14 1614    PT LONG TERM GOAL #1   Title RICE   Time 8   Period Weeks   Status Achieved   PT LONG TERM GOAL #2   Title advanced HEP   Time 8   Period Weeks   Status On-going   PT LONG TERM GOAL #3   Title pain decrease 50%   Time 8   Period Weeks   Status On-going   PT LONG TERM GOAL #4   Title increase ROM 5-100   Time 8   Period Weeks   Status On-going   PT LONG TERM GOAL #5   Title walk all community distances without AD   Time 8   Period Weeks   Status Achieved               Plan - 12/08/14 1050    Clinical  Impression Statement The fall last week really set her back as far as swelling and stiffness, however ROM today was not bad   Pt will benefit from skilled therapeutic intervention in order to improve on the following deficits Abnormal gait;Decreased activity tolerance;Decreased endurance;Decreased mobility;Decreased range of motion;Decreased strength;Decreased scar mobility;Difficulty walking;Increased edema;Pain;Decreased balance   Rehab Potential Good   PT Frequency 2x / week   PT Duration 8 weeks   PT Next Visit Plan Continue to strengthen, work on balance, and ROM.   Consulted and Agree with Plan of Care Patient        Problem List Patient Active Problem List   Diagnosis Date Noted  . Arthritis of left knee 10/13/2014  . Status post total left knee replacement 10/13/2014    Sumner Boast, PT 12/08/2014, 10:52 AM  [address] [place], [place], [address] Phone: [phone]   Fax:  [phone]

## 2014-12-10 ENCOUNTER — Encounter: Payer: Self-pay | Admitting: Physical Therapy

## 2014-12-10 ENCOUNTER — Ambulatory Visit: Payer: Medicare Other | Admitting: Physical Therapy

## 2014-12-10 DIAGNOSIS — M25562 Pain in left knee: Secondary | ICD-10-CM

## 2014-12-10 DIAGNOSIS — Z96652 Presence of left artificial knee joint: Secondary | ICD-10-CM | POA: Diagnosis not present

## 2014-12-10 NOTE — Therapy (Signed)
[address] [place], [place], [address] Phone: [phone]   Fax:  [phone]  Physical Therapy Treatment  Patient Details  Name: Debra Wright MRN: [id number] Date of Birth: 13-Aug-1951 Referring Provider:  Mcarthur Rossetti*  Encounter Date: 12/10/2014      PT End of Session - 12/10/14 1226    Visit Number 8   PT Start Time 9417   PT Stop Time 1250   PT Time Calculation (min) 61 min      Past Medical History  Diagnosis Date  . Hypertension   . Depression   . Anxiety   . GERD (gastroesophageal reflux disease)   . High cholesterol   . Type II diabetes mellitus   . Migraine     "frequently when I was younger; maybe monthly now" (10/14/2014)  . Arthritis     "knees; right thumb" (10/14/2014)  . Basal cell carcinoma     "burned off upper lip & right shoulder"    Past Surgical History  Procedure Laterality Date  . Carpal tunnel release Bilateral 1986  . Bilateral oophorectomy  2004  . Cataract extraction w/ intraocular lens  implant, bilateral Bilateral 2011  . Shoulder arthroscopy distal clavicle excision and open rotator cuff repair Right 11/2013  . Total knee arthroplasty Left 10/13/2014    Procedure: LEFT TOTAL KNEE ARTHROPLASTY;  Surgeon: Mcarthur Rossetti, MD;  Location: [facility];  Service: Orthopedics;  Laterality: Left;  . Joint replacement    . Tonsillectomy  1950's  . Appendectomy  1988  . Abdominal hysterectomy  1988    There were no vitals filed for this visit.  Visit Diagnosis:  Left knee pain      Subjective Assessment - 12/10/14 1151    Symptoms knee feeling better, feeling a bit off balance today   Currently in Pain? Yes   Pain Score 3    Pain Location Knee   Pain Orientation Left   Pain Descriptors / Indicators Aching            [facility] PT Assessment - 12/10/14 0001    ROM / Strength   AROM / PROM / Strength AROM   AROM   AROM Assessment Site Knee   Right/Left Knee Left   Left Knee Extension 1   Left Knee Flexion 100                   [facility] Adult PT Treatment/Exercise - 12/10/14 0001    Dynamic Standing Balance   Dynamic Standing - Comments dynamic balance activitis on airex   Knee/Hip Exercises: Aerobic   Stationary Bike 6 minutes   Elliptical Nustep Level 6 x 6 minutes   Knee/Hip Exercises: Machines for Strengthening   Cybex Knee Extension 10#   Cybex Knee Flexion 25#   Cybex Leg Press 30#   Total Gym Leg Press calf raises 20# 2 sets 10   Electrical Stimulation   Electrical Stimulation Location left knee   Electrical Stimulation Parameters IFC   Electrical Stimulation Goals Pain;Edema   Manual Therapy   Manual Therapy Passive ROM;Myofascial release   Passive ROM left knee flexion and extension                  PT Short Term Goals - 11/19/14 1642    PT SHORT TERM GOAL #1   Title I initial HEP   Status Achieved   PT SHORT TERM GOAL #2   Status Achieved  PT SHORT TERM GOAL #3   Title advanced HEP   Additional Short Term Goals   Additional Short Term Goals Yes           PT Long Term Goals - 12/10/14 1227    PT LONG TERM GOAL #4   Title increase ROM 5-100   Status Achieved               Plan - 12/10/14 1227    Clinical Impression Statement pt with decreased pain and increased ROM. Pt with decreased balance during todays session. Pt seeing MD 12/23/14         Problem List Patient Active Problem List   Diagnosis Date Noted  . Arthritis of left knee 10/13/2014  . Status post total left knee replacement 10/13/2014    Parisha Beaulac,ANGIE,PTA 12/10/2014, 12:28 PM  [place] [address] [place], [place], [address] Phone: [phone]   Fax:  [phone]

## 2014-12-15 ENCOUNTER — Encounter: Payer: Self-pay | Admitting: Physical Therapy

## 2014-12-15 ENCOUNTER — Ambulatory Visit: Payer: Medicare Other | Admitting: Physical Therapy

## 2014-12-15 DIAGNOSIS — M25562 Pain in left knee: Secondary | ICD-10-CM | POA: Diagnosis not present

## 2014-12-15 DIAGNOSIS — Z96652 Presence of left artificial knee joint: Secondary | ICD-10-CM | POA: Diagnosis not present

## 2014-12-15 NOTE — Therapy (Signed)
[address] [place], [place], [address] Phone: [phone]   Fax:  [phone]  Physical Therapy Treatment  Patient Details  Name: Debra Wright MRN: [id number] Date of Birth: May 09, 1951 Referring Provider:  Mcarthur Rossetti*  Encounter Date: 12/15/2014      PT End of Session - 12/15/14 1430    Visit Number 9   PT Start Time 1400   PT Stop Time 1500   PT Time Calculation (min) 60 min      Past Medical History  Diagnosis Date  . Hypertension   . Depression   . Anxiety   . GERD (gastroesophageal reflux disease)   . High cholesterol   . Type II diabetes mellitus   . Migraine     "frequently when I was younger; maybe monthly now" (10/14/2014)  . Arthritis     "knees; right thumb" (10/14/2014)  . Basal cell carcinoma     "burned off upper lip & right shoulder"    Past Surgical History  Procedure Laterality Date  . Carpal tunnel release Bilateral 1986  . Bilateral oophorectomy  2004  . Cataract extraction w/ intraocular lens  implant, bilateral Bilateral 2011  . Shoulder arthroscopy distal clavicle excision and open rotator cuff repair Right 11/2013  . Total knee arthroplasty Left 10/13/2014    Procedure: LEFT TOTAL KNEE ARTHROPLASTY;  Surgeon: Mcarthur Rossetti, MD;  Location: [facility];  Service: Orthopedics;  Laterality: Left;  . Joint replacement    . Tonsillectomy  1950's  . Appendectomy  1988  . Abdominal hysterectomy  1988    There were no vitals filed for this visit.  Visit Diagnosis:  Left knee pain      Subjective Assessment - 12/15/14 1400    Symptoms feeling alot better, pain  75 % better overall   Currently in Pain? Yes   Pain Score 2    Pain Location Knee   Pain Orientation Left   Pain Descriptors / Indicators Aching   Pain Type Surgical pain                       [facility] Adult PT Treatment/Exercise - 12/15/14 0001    Knee/Hip Exercises: Aerobic   Stationary Bike Nustep L6 6 min   Elliptical I 5 R 4 2 min fwd and 2 back   Knee/Hip Exercises: Machines for Strengthening   Cybex Knee Extension 15# 3 sets 10   Cybex Knee Flexion 35# 3 sets 10   Cybex Leg Press 50 # 2 sets 10, calf raises 50# 2 sets 10   Knee/Hip Exercises: Standing   Lateral Step Up Left;2 sets;10 reps  6 inch   Forward Step Up Left;2 sets;10 reps  6 inch   Step Down Left;2 sets;10 reps  4   Wall Squat 2 sets;10 reps   Modalities   Modalities Cryotherapy;Electrical Stimulation   Cryotherapy   Number Minutes Cryotherapy 15 Minutes   Cryotherapy Location Knee   Type of Cryotherapy --  Nature conservation officer Location left knee   Electrical Stimulation Parameters IFC   Electrical Stimulation Goals Pain;Edema                  PT Short Term Goals - 11/19/14 1642    PT SHORT TERM GOAL #1   Title I initial HEP   Status Achieved   PT SHORT TERM GOAL #2   Status Achieved  PT SHORT TERM GOAL #3   Title advanced HEP   Additional Short Term Goals   Additional Short Term Goals Yes           PT Long Term Goals - 12/15/14 1432    PT LONG TERM GOAL #2   Title advanced HEP   PT LONG TERM GOAL #3   Title pain decrease 50%   Status Achieved   PT LONG TERM GOAL #4   Title increase ROM 5-100   Status On-going               Plan - 12/15/14 1430    Clinical Impression Statement pt tolerated increased reps and weight with increased struggle        Problem List Patient Active Problem List   Diagnosis Date Noted  . Arthritis of left knee 10/13/2014  . Status post total left knee replacement 10/13/2014    Tovia Kisner,ANGIE,PTA 12/15/2014, 2:33 PM  [place] [address] [place], [place], [address] Phone: [phone]   Fax:  [phone]

## 2014-12-17 ENCOUNTER — Ambulatory Visit: Payer: Medicare Other | Admitting: Physical Therapy

## 2014-12-22 ENCOUNTER — Encounter: Payer: Self-pay | Admitting: Physical Therapy

## 2014-12-22 ENCOUNTER — Ambulatory Visit: Payer: Medicare Other | Admitting: Physical Therapy

## 2014-12-22 DIAGNOSIS — M25562 Pain in left knee: Secondary | ICD-10-CM | POA: Diagnosis not present

## 2014-12-22 DIAGNOSIS — Z96652 Presence of left artificial knee joint: Secondary | ICD-10-CM | POA: Diagnosis not present

## 2014-12-22 NOTE — Therapy (Signed)
[address] [place], [place], [address] Phone: [phone]   Fax:  [phone]  Physical Therapy Treatment  Patient Details  Name: Debra Wright MRN: [id number] Date of Birth: 1951/04/04 Referring Provider:  Mcarthur Rossetti*  Encounter Date: 12/22/2014      PT End of Session - 12/22/14 1403    PT Start Time 1320   PT Stop Time 1430   PT Time Calculation (min) 70 min      Past Medical History  Diagnosis Date  . Hypertension   . Depression   . Anxiety   . GERD (gastroesophageal reflux disease)   . High cholesterol   . Type II diabetes mellitus   . Migraine     "frequently when I was younger; maybe monthly now" (10/14/2014)  . Arthritis     "knees; right thumb" (10/14/2014)  . Basal cell carcinoma     "burned off upper lip & right shoulder"    Past Surgical History  Procedure Laterality Date  . Carpal tunnel release Bilateral 1986  . Bilateral oophorectomy  2004  . Cataract extraction w/ intraocular lens  implant, bilateral Bilateral 2011  . Shoulder arthroscopy distal clavicle excision and open rotator cuff repair Right 11/2013  . Total knee arthroplasty Left 10/13/2014    Procedure: LEFT TOTAL KNEE ARTHROPLASTY;  Surgeon: Mcarthur Rossetti, MD;  Location: [facility];  Service: Orthopedics;  Laterality: Left;  . Joint replacement    . Tonsillectomy  1950's  . Appendectomy  1988  . Abdominal hysterectomy  1988    There were no vitals filed for this visit.  Visit Diagnosis:  Left knee pain      Subjective Assessment - 12/22/14 1330    Symptoms moving better, foot catches every now and again            [facility] PT Assessment - 12/22/14 0001    Observation/Other Assessments   Focus on Therapeutic Outcomes (FOTO)  53. 47% limited    ROM / Strength   AROM / PROM / Strength Strength   AROM   AROM Assessment Site Knee   Right/Left Knee Left   Left Knee Extension 0   Left Knee Flexion  110   Strength   Strength Assessment Site Knee   Right/Left Knee Left   Left Knee Flexion 4+/5   Left Knee Extension 4+/5                   [facility] Adult PT Treatment/Exercise - 12/22/14 0001    Ambulation/Gait   Stairs Yes   Stairs Assistance 6: Modified independent (Device/Increase time)   Stair Management Technique One rail Left   Number of Stairs 10   Gait Comments step over step   Knee/Hip Exercises: Aerobic   Stationary Bike 6 min   Elliptical Nustep L 6 40min   Tread Mill 1.7 mph 5 min to work on increased speed   Knee/Hip Exercises: Machines for Strengthening   Cybex Knee Extension 15# 3 sets 10   Cybex Knee Flexion 35# 3 sets 10   Cybex Leg Press 50 # 2 sets 10, calf raises 50# 2 sets 10   Total Gym Leg Press calf raises 30# 2 sets 15   Modalities   Modalities Cryotherapy   Cryotherapy   Number Minutes Cryotherapy 15 Minutes   Cryotherapy Location Knee   Type of Cryotherapy Ice pack   Electrical Stimulation   Electrical Stimulation Location left knee  Electrical Stimulation Parameters IFC   Electrical Stimulation Goals Pain;Edema                  PT Short Term Goals - 11/19/14 1642    PT SHORT TERM GOAL #1   Title I initial HEP   Status Achieved   PT SHORT TERM GOAL #2   Status Achieved   PT SHORT TERM GOAL #3   Title advanced HEP   Additional Short Term Goals   Additional Short Term Goals Yes           PT Long Term Goals - 01/18/15 1359    PT LONG TERM GOAL #2   Title advanced HEP   Status On-going   PT LONG TERM GOAL #3   Title pain decrease 50%   Status Achieved   PT LONG TERM GOAL #4   Title increase ROM 5-100   Status Achieved               Plan - January 18, 2015 1403    PT Duration 2 weeks          G-Codes - 18-Jan-2015 1408    Functional Assessment Tool Used FOTO 47% limited   Functional Limitation Mobility: Walking and moving around   Mobility: Walking and Moving Around Current Status [phone]) At least 40  percent but less than 60 percent impaired, limited or restricted   Mobility: Walking and Moving Around Goal Status [phone]) At least 20 percent but less than 40 percent impaired, limited or restricted      Problem List Patient Active Problem List   Diagnosis Date Noted  . Arthritis of left knee 10/13/2014  . Status post total left knee replacement 10/13/2014    Angie Nuchem Grattan PTA  January 18, 2015, 2:16 PM  [address] [place], [place], [address] Phone: [phone]   Fax:  [phone]     Laureen Abrahams, PT, DPT 2015-01-18 2:16 PM  [facility] [address] [place], [place] [address]  [phone] (office) [phone] (fax)

## 2014-12-24 ENCOUNTER — Ambulatory Visit: Payer: Medicare Other | Admitting: Physical Therapy

## 2014-12-29 ENCOUNTER — Ambulatory Visit: Payer: Medicare Other | Admitting: Physical Therapy

## 2014-12-29 ENCOUNTER — Encounter: Payer: Self-pay | Admitting: Physical Therapy

## 2014-12-29 DIAGNOSIS — M25562 Pain in left knee: Secondary | ICD-10-CM

## 2014-12-29 DIAGNOSIS — Z96652 Presence of left artificial knee joint: Secondary | ICD-10-CM | POA: Diagnosis not present

## 2014-12-29 NOTE — Therapy (Signed)
[place] [address] [place], [place], [address] Phone: [phone]   Fax:  [phone]  Physical Therapy Treatment  Patient Details  Name: Debra Wright MRN: [id number] Date of Birth: [birth year] Referring Provider:  Mcarthur Rossetti*  Encounter Date: 12/29/2014      PT End of Session - 12/29/14 1301    Visit Number 11   Number of Visits 16   Date for PT Re-Evaluation 01/08/15   PT Start Time 6803   PT Stop Time 1320   PT Time Calculation (min) 45 min      Past Medical History  Diagnosis Date  . Hypertension   . Depression   . Anxiety   . GERD (gastroesophageal reflux disease)   . High cholesterol   . Type II diabetes mellitus   . Migraine     "frequently when I was younger; maybe monthly now" (10/14/2014)  . Arthritis     "knees; right thumb" (10/14/2014)  . Basal cell carcinoma     "burned off upper lip & right shoulder"    Past Surgical History  Procedure Laterality Date  . Carpal tunnel release Bilateral 1986  . Bilateral oophorectomy  2004  . Cataract extraction w/ intraocular lens  implant, bilateral Bilateral 2011  . Shoulder arthroscopy distal clavicle excision and open rotator cuff repair Right 11/2013  . Total knee arthroplasty Left 10/13/2014    Procedure: LEFT TOTAL KNEE ARTHROPLASTY;  Surgeon: Mcarthur Rossetti, MD;  Location: [facility];  Service: Orthopedics;  Laterality: Left;  . Joint replacement    . Tonsillectomy  1950's  . Appendectomy  1988  . Abdominal hysterectomy  1988    There were no vitals filed for this visit.  Visit Diagnosis:  Left knee pain      Subjective Assessment - 12/29/14 1239    Symptoms MD pleased, okay to D/C as planned. MD f/u in 1 month then determine RTW date   Currently in Pain? Yes   Pain Score 1    Pain Location Knee   Pain Orientation Left   Pain Descriptors / Indicators Aching                       [facility] Adult PT  Treatment/Exercise - 12/29/14 0001    Ambulation/Gait   Gait Comments amb outside all surfaces mod I no AD. Step over step with rail   Knee/Hip Exercises: Aerobic   Stationary Bike 6 min   Elliptical Nustep L 6 32min   Knee/Hip Exercises: Machines for Strengthening   Cybex Knee Extension 10# left only 2 sets 10   Cybex Knee Flexion 20# left only 2 sets 10   Cybex Leg Press 50 # 2 sets 10, calf raises 50# 2 sets 10   Knee/Hip Exercises: Standing   Forward Lunges Left;15 reps  BOSU   Lateral Step Up Left;1 set;15 reps  BOSU   Forward Step Up Left;2 sets;10 reps  4 inch        TRAIL WITHOUT MODALITIES TODAY          PT Short Term Goals - 11/19/14 1642    PT SHORT TERM GOAL #1   Title I initial HEP   Status Achieved   PT SHORT TERM GOAL #2   Status Achieved   PT SHORT TERM GOAL #3   Title advanced HEP   Additional Short Term Goals   Additional Short Term Goals Yes  PT Long Term Goals - 12/29/14 1303    PT LONG TERM GOAL #2   Title advanced HEP   Status On-going               Plan - 12/29/14 1301    Clinical Impression Statement pt able to amb 1000 plus feet all terrain mod I without AD. Progressing with strenth and endurance.   PT Next Visit Plan progress towards d/c        Problem List Patient Active Problem List   Diagnosis Date Noted  . Arthritis of left knee 10/13/2014  . Status post total left knee replacement 10/13/2014    PAYSEUR,ANGIEPTA 12/29/2014, 1:04 PM  [place] [address] [place], [place], [address] Phone: [phone]   Fax:  [phone]

## 2014-12-31 ENCOUNTER — Encounter: Payer: Self-pay | Admitting: Physical Therapy

## 2014-12-31 ENCOUNTER — Ambulatory Visit: Payer: Medicare Other | Admitting: Physical Therapy

## 2014-12-31 DIAGNOSIS — Z96652 Presence of left artificial knee joint: Secondary | ICD-10-CM | POA: Diagnosis not present

## 2014-12-31 DIAGNOSIS — M25562 Pain in left knee: Secondary | ICD-10-CM

## 2014-12-31 NOTE — Therapy (Signed)
[address] [place], [place], [address] Phone: [phone]   Fax:  [phone]  Physical Therapy Treatment  Patient Details  Name: Debra Wright MRN: [id number] Date of Birth: 1951/09/21 Referring Provider:  Mcarthur Rossetti*  Encounter Date: 12/31/2014      PT End of Session - 12/31/14 1523    Visit Number 12   PT Start Time 4270   PT Stop Time 6237   PT Time Calculation (min) 43 min      Past Medical History  Diagnosis Date  . Hypertension   . Depression   . Anxiety   . GERD (gastroesophageal reflux disease)   . High cholesterol   . Type II diabetes mellitus   . Migraine     "frequently when I was younger; maybe monthly now" (10/14/2014)  . Arthritis     "knees; right thumb" (10/14/2014)  . Basal cell carcinoma     "burned off upper lip & right shoulder"    Past Surgical History  Procedure Laterality Date  . Carpal tunnel release Bilateral 1986  . Bilateral oophorectomy  2004  . Cataract extraction w/ intraocular lens  implant, bilateral Bilateral 2011  . Shoulder arthroscopy distal clavicle excision and open rotator cuff repair Right 11/2013  . Total knee arthroplasty Left 10/13/2014    Procedure: LEFT TOTAL KNEE ARTHROPLASTY;  Surgeon: Mcarthur Rossetti, MD;  Location: [facility];  Service: Orthopedics;  Laterality: Left;  . Joint replacement    . Tonsillectomy  1950's  . Appendectomy  1988  . Abdominal hysterectomy  1988    There were no vitals filed for this visit.  Visit Diagnosis:  Left knee pain      Subjective Assessment - 12/31/14 1447    Symptoms stiffness, okay without modalities last session   Currently in Pain? Yes   Pain Score 1    Pain Location Knee   Pain Orientation Left   Pain Descriptors / Indicators --  stiffness            [facility] PT Assessment - 12/31/14 0001    AROM   AROM Assessment Site Knee   Right/Left Knee Left   Left Knee Extension 0   Left  Knee Flexion 109                   [facility] Adult PT Treatment/Exercise - 12/31/14 0001    Dynamic Standing Balance   Dynamic Standing - Comments airex and cone tapping dynamic balance   Knee/Hip Exercises: Aerobic   Stationary Bike 6 min   Elliptical Nustep L 6 28min   Tread Mill 1.7 mph 5 min to work on increased speed   Knee/Hip Exercises: Machines for Strengthening   Cybex Knee Extension 10# left only 3 sets 10   Cybex Knee Flexion 25# left only 3 sets 10   Cybex Leg Press 50 # 2 sets 15, calf raises 50# 2 sets 15   Knee/Hip Exercises: Standing   Other Standing Knee Exercises green tband hip 3 way on airex 15 times bilaterally                  PT Short Term Goals - 11/19/14 1642    PT SHORT TERM GOAL #1   Title I initial HEP   Status Achieved   PT SHORT TERM GOAL #2   Status Achieved   PT SHORT TERM GOAL #3   Title advanced HEP   Additional Short Term  Goals   Additional Short Term Goals Yes           PT Long Term Goals - 12/31/14 1528    PT LONG TERM GOAL #2   Title advanced HEP   Status On-going   PT LONG TERM GOAL #3   Title pain decrease 50%               Plan - 12/31/14 1524    Clinical Impression Statement continue to build strength and endurance as pt fatigues quickly        Problem List Patient Active Problem List   Diagnosis Date Noted  . Arthritis of left knee 10/13/2014  . Status post total left knee replacement 10/13/2014    Debra Wright,ANGIE PTA 12/31/2014, 3:29 PM  [place] [address] [place], [place], [address] Phone: [phone]   Fax:  [phone]

## 2015-01-05 ENCOUNTER — Encounter: Payer: Self-pay | Admitting: Physical Therapy

## 2015-01-05 ENCOUNTER — Ambulatory Visit: Payer: Medicare Other | Attending: Orthopaedic Surgery | Admitting: Physical Therapy

## 2015-01-05 DIAGNOSIS — Z96652 Presence of left artificial knee joint: Secondary | ICD-10-CM | POA: Diagnosis not present

## 2015-01-05 DIAGNOSIS — M25562 Pain in left knee: Secondary | ICD-10-CM | POA: Diagnosis not present

## 2015-01-05 NOTE — Therapy (Signed)
[address] [place], [place], [address] Phone: [phone]   Fax:  [phone]  Physical Therapy Treatment  Patient Details  Name: Debra Wright MRN: [id number] Date of Birth: 11-22-1950 Referring Provider:  Mcarthur Rossetti*  Encounter Date: 01/05/2015      PT End of Session - 01/05/15 1323    Visit Number 13   PT Start Time 1232   PT Stop Time 9381   PT Time Calculation (min) 63 min      Past Medical History  Diagnosis Date  . Hypertension   . Depression   . Anxiety   . GERD (gastroesophageal reflux disease)   . High cholesterol   . Type II diabetes mellitus   . Migraine     "frequently when I was younger; maybe monthly now" (10/14/2014)  . Arthritis     "knees; right thumb" (10/14/2014)  . Basal cell carcinoma     "burned off upper lip & right shoulder"    Past Surgical History  Procedure Laterality Date  . Carpal tunnel release Bilateral 1986  . Bilateral oophorectomy  2004  . Cataract extraction w/ intraocular lens  implant, bilateral Bilateral 2011  . Shoulder arthroscopy distal clavicle excision and open rotator cuff repair Right 11/2013  . Total knee arthroplasty Left 10/13/2014    Procedure: LEFT TOTAL KNEE ARTHROPLASTY;  Surgeon: Mcarthur Rossetti, MD;  Location: [facility];  Service: Orthopedics;  Laterality: Left;  . Joint replacement    . Tonsillectomy  1950's  . Appendectomy  1988  . Abdominal hysterectomy  1988    There were no vitals filed for this visit.  Visit Diagnosis:  Left knee pain      Subjective Assessment - 01/05/15 1235    Subjective walked all over hospital this morning,very sore both knees   Currently in Pain? Yes   Pain Score 5    Pain Location Knee   Pain Orientation Left                       [facility] Adult PT Treatment/Exercise - 01/05/15 0001    Knee/Hip Exercises: Aerobic   Stationary Bike 6 min   Elliptical Nustep L5 6 min   Knee/Hip Exercises: Machines for Strengthening   Cybex Knee Extension 10# left only 3 sets 10   Cybex Knee Flexion 25# left only 3 sets 10   Cybex Leg Press 50 # 2 sets 15, calf raises 50# 2 sets 15   Knee/Hip Exercises: Standing   Forward Step Up Left;10 reps;3 sets  4 inch   Step Down Left;10 reps;3 sets  4   Other Standing Knee Exercises green tband side stepping 20 feet 2 times each   Other Standing Knee Exercises airex and rocker board balance and proprioception  needed UE support d/t poor balance   Knee/Hip Exercises: Supine   Other Supine Knee Exercises HS curl with ball 3 sets 10                  PT Short Term Goals - 11/19/14 1642    PT SHORT TERM GOAL #1   Title I initial HEP   Status Achieved   PT SHORT TERM GOAL #2   Status Achieved   PT SHORT TERM GOAL #3   Title advanced HEP   Additional Short Term Goals   Additional Short Term Goals Yes           PT Long  Term Goals - 12/31/14 1528    PT LONG TERM GOAL #2   Title advanced HEP   Status On-going   PT LONG TERM GOAL #3   Title pain decrease 50%               Plan - 01/05/15 1323    Clinical Impression Statement pt arrived with increased pain and stiffness, but felt better after ther ex. Painful and limited with step ups and decreased SLS/proprioception   PT Next Visit Plan D/C        Problem List Patient Active Problem List   Diagnosis Date Noted  . Arthritis of left knee 10/13/2014  . Status post total left knee replacement 10/13/2014    Raeana Blinn,ANGIE PTA 01/05/2015, 1:26 PM  [address] [place], [place], [address] Phone: [phone]   Fax:  [phone]

## 2015-01-07 ENCOUNTER — Ambulatory Visit: Payer: Medicare Other | Admitting: Physical Therapy

## 2015-01-07 ENCOUNTER — Encounter: Payer: Self-pay | Admitting: Physical Therapy

## 2015-01-07 DIAGNOSIS — Z96652 Presence of left artificial knee joint: Secondary | ICD-10-CM | POA: Diagnosis not present

## 2015-01-07 DIAGNOSIS — M25562 Pain in left knee: Secondary | ICD-10-CM

## 2015-01-07 NOTE — Therapy (Signed)
[address] [place], [place], [address] Phone: [phone]   Fax:  [phone]  Physical Therapy Treatment  Patient Details  Name: Debra Wright MRN: [id number] Date of Birth: 11-Jul-1951 Referring Provider:  Mcarthur Rossetti*  Encounter Date: 01/07/2015      PT End of Session - 01/07/15 1538    Visit Number 14   PT Start Time 6203   PT Stop Time 1550   PT Time Calculation (min) 55 min      Past Medical History  Diagnosis Date  . Hypertension   . Depression   . Anxiety   . GERD (gastroesophageal reflux disease)   . High cholesterol   . Type II diabetes mellitus   . Migraine     "frequently when I was younger; maybe monthly now" (10/14/2014)  . Arthritis     "knees; right thumb" (10/14/2014)  . Basal cell carcinoma     "burned off upper lip & right shoulder"    Past Surgical History  Procedure Laterality Date  . Carpal tunnel release Bilateral 1986  . Bilateral oophorectomy  2004  . Cataract extraction w/ intraocular lens  implant, bilateral Bilateral 2011  . Shoulder arthroscopy distal clavicle excision and open rotator cuff repair Right 11/2013  . Total knee arthroplasty Left 10/13/2014    Procedure: LEFT TOTAL KNEE ARTHROPLASTY;  Surgeon: Mcarthur Rossetti, MD;  Location: [facility];  Service: Orthopedics;  Laterality: Left;  . Joint replacement    . Tonsillectomy  1950's  . Appendectomy  1988  . Abdominal hysterectomy  1988    There were no vitals filed for this visit.  Visit Diagnosis:  Left knee pain      Subjective Assessment - 01/07/15 1455    Subjective left knee achey, feeling okay overall   Pain Score 1    Pain Location Knee   Pain Orientation Left   Pain Descriptors / Indicators Aching            [facility] PT Assessment - 01/07/15 0001    AROM   AROM Assessment Site Knee   Right/Left Knee Left   Left Knee Extension 0   Left Knee Flexion 110   Strength   Strength  Assessment Site Knee   Right/Left Knee Left   Left Knee Flexion 5/5   Left Knee Extension 5/5                   [facility] Adult PT Treatment/Exercise - 01/07/15 0001    Knee/Hip Exercises: Aerobic   Stationary Bike 6 min   Elliptical Nustep L5 6 min   Knee/Hip Exercises: Machines for Strengthening   Cybex Knee Extension 10# left only 3 sets 10   Cybex Knee Flexion 25# left only 3 sets 10   Cybex Leg Press 50 # 2 sets 15, calf raises 50# 2 sets 15   Total Gym Leg Press calf raises 30# 2 sets 15   Knee/Hip Exercises: Standing   Other Standing Knee Exercises green tband monster walking 20 feet 4 times   Other Standing Knee Exercises airex and rocker board balance and proprioception  needed UE support d/t poor balance                PT Education - 01/07/15 1502    Education provided Yes   Education Details stressed important of HEp and continued activity, rec stationary bike to maintain ROM and strength   Person(s) Educated Patient  Methods Explanation   Comprehension Verbalized understanding          PT Short Term Goals - 01/07/15 1521    PT SHORT TERM GOAL #3   Title advanced HEP   Status Achieved           PT Long Term Goals - 01/07/15 1522    PT LONG TERM GOAL #2   Title advanced HEP   Status Achieved               Plan - 01/07/15 1541    Clinical Impression Statement pt tolerated all ther ex wel, goals met an dgood ROM. Encouraged to continue with HEP and ride stationary bike at apt complex. DISCHARGE   PT Next Visit Plan Discharged today   Recommended Other Services ride bike at apt gym   Consulted and Agree with Plan of Care Patient        Problem List Patient Active Problem List   Diagnosis Date Noted  . Arthritis of left knee 10/13/2014  . Status post total left knee replacement 10/13/2014    PAYSEUR,ANGIE PTA Lum Babe PT 01/07/2015, 3:43 PM  [place] [address] [place], [place], [address] Phone: [phone]   Fax:  [phone]

## 2015-03-16 DIAGNOSIS — I10 Essential (primary) hypertension: Secondary | ICD-10-CM | POA: Diagnosis not present

## 2015-03-16 DIAGNOSIS — Z6837 Body mass index (BMI) 37.0-37.9, adult: Secondary | ICD-10-CM | POA: Diagnosis not present

## 2015-03-16 DIAGNOSIS — D510 Vitamin B12 deficiency anemia due to intrinsic factor deficiency: Secondary | ICD-10-CM | POA: Diagnosis not present

## 2015-03-16 DIAGNOSIS — E1165 Type 2 diabetes mellitus with hyperglycemia: Secondary | ICD-10-CM | POA: Diagnosis not present

## 2015-04-28 DIAGNOSIS — M1712 Unilateral primary osteoarthritis, left knee: Secondary | ICD-10-CM | POA: Diagnosis not present

## 2015-06-15 DIAGNOSIS — E1165 Type 2 diabetes mellitus with hyperglycemia: Secondary | ICD-10-CM | POA: Diagnosis not present

## 2015-06-15 DIAGNOSIS — I10 Essential (primary) hypertension: Secondary | ICD-10-CM | POA: Diagnosis not present

## 2015-06-15 DIAGNOSIS — Z6838 Body mass index (BMI) 38.0-38.9, adult: Secondary | ICD-10-CM | POA: Diagnosis not present

## 2015-06-15 DIAGNOSIS — M9261 Juvenile osteochondrosis of tarsus, right ankle: Secondary | ICD-10-CM | POA: Diagnosis not present

## 2015-06-15 DIAGNOSIS — E785 Hyperlipidemia, unspecified: Secondary | ICD-10-CM | POA: Diagnosis not present

## 2015-06-15 DIAGNOSIS — M19041 Primary osteoarthritis, right hand: Secondary | ICD-10-CM | POA: Diagnosis not present

## 2015-06-15 DIAGNOSIS — G5602 Carpal tunnel syndrome, left upper limb: Secondary | ICD-10-CM | POA: Diagnosis not present

## 2015-08-13 DIAGNOSIS — G5602 Carpal tunnel syndrome, left upper limb: Secondary | ICD-10-CM | POA: Diagnosis not present

## 2015-08-20 ENCOUNTER — Other Ambulatory Visit: Payer: Self-pay | Admitting: *Deleted

## 2015-08-20 DIAGNOSIS — G5602 Carpal tunnel syndrome, left upper limb: Secondary | ICD-10-CM

## 2015-09-03 ENCOUNTER — Ambulatory Visit (INDEPENDENT_AMBULATORY_CARE_PROVIDER_SITE_OTHER): Payer: Medicare Other | Admitting: Neurology

## 2015-09-03 DIAGNOSIS — G5602 Carpal tunnel syndrome, left upper limb: Secondary | ICD-10-CM | POA: Diagnosis not present

## 2015-09-03 NOTE — Procedures (Signed)
[facility] Neurology  [place] [place]  [place], [place] [address] Tel: [phone] Fax:  [phone] Test Date:  09/03/2015  Patient: Debra Wright DOB: 12/07/1947 Physician: Narda Amber  Sex: Female Height: 5\' 3"  Ref Phys: Kathryne Hitch, M.D.  ID#: [id number] Temp: 34.3C Technician: Jerilynn Mages. Dean   Patient Complaints: This is a 64 year old female referred for evaluation of left hand paresthesias. She had bilateral carpal tunnel release greater than 30 years ago.  NCV & EMG Findings: Extensive electrodiagnostic testing of the left upper extremity shows: 1. Left median sensory response is mildly prolonged (4.1 ms) normal amplitude. Left ulnar sensory response is within normal limits. 2. Left median and ulnar motor responses are within normal limits. 3. There is no evidence of active or chronic motor axon loss changes affecting any of the tested muscles. Motor unit configuration and recruitment pattern is within normal limits.  Impression: 1. Residuals of a left median neuropathy at or distal to the wrist, consistent with the clinical diagnosis of carpal tunnel syndrome.  Overall, these findings are mild in degree electrically. 2. There is no evidence of a cervical radiculopathy affecting the left upper extremity.   _____________________________ Narda Amber, D.O.    Nerve Conduction Studies Anti Sensory Summary Table   Stim Site NR Peak (ms) Norm Peak (ms) P-T Amp (V) Norm P-T Amp  Left Median Anti Sensory (2nd Digit)  Wrist    4.1 <3.8 12.8 >10  Left Ulnar Anti Sensory (5th Digit)  Wrist    3.1 <3.2 7.0 >5   Motor Summary Table   Stim Site NR Onset (ms) Norm Onset (ms) O-P Amp (mV) Norm O-P Amp Site1 Site2 Delta-0 (ms) Dist (cm) Vel (m/s) Norm Vel (m/s)  Left Median Motor (Abd Poll Brev)  Wrist    3.9 <4.0 6.9 >5 Elbow Wrist 5.3 29.0 55 >50  Elbow    9.2  6.4         Left Ulnar Motor (Abd Dig Minimi)  Wrist    3.0 <3.1 7.7 >7 B Elbow Wrist 3.2 17.0 53 >50  B Elbow     6.2  7.0  A Elbow B Elbow 1.6 10.0 63 >50  A Elbow    7.8  6.5          EMG   Side Muscle Ins Act Fibs Psw Fasc Number Recrt Dur Dur. Amp Amp. Poly Poly. Comment  Left 1stDorInt Nml Nml Nml Nml Nml Nml Nml Nml Nml Nml Nml Nml N/A  Left Abd Poll Brev Nml Nml Nml Nml Nml Nml Nml Nml Nml Nml Nml Nml N/A  Left Ext Indicis Nml Nml Nml Nml Nml Nml Nml Nml Nml Nml Nml Nml N/A  Left PronatorTeres Nml Nml Nml Nml Nml Nml Nml Nml Nml Nml Nml Nml N/A  Left Biceps Nml Nml Nml Nml Nml Nml Nml Nml Nml Nml Nml Nml N/A  Left Triceps Nml Nml Nml Nml Nml Nml Nml Nml Nml Nml Nml Nml N/A  Left Deltoid Nml Nml Nml Nml Nml Nml Nml Nml Nml Nml Nml Nml N/A      Waveforms:

## 2015-09-07 DIAGNOSIS — G5602 Carpal tunnel syndrome, left upper limb: Secondary | ICD-10-CM | POA: Diagnosis not present

## 2015-09-14 DIAGNOSIS — E784 Other hyperlipidemia: Secondary | ICD-10-CM | POA: Diagnosis not present

## 2015-09-14 DIAGNOSIS — Z6836 Body mass index (BMI) 36.0-36.9, adult: Secondary | ICD-10-CM | POA: Diagnosis not present

## 2015-09-14 DIAGNOSIS — E119 Type 2 diabetes mellitus without complications: Secondary | ICD-10-CM | POA: Diagnosis not present

## 2015-09-14 DIAGNOSIS — I10 Essential (primary) hypertension: Secondary | ICD-10-CM | POA: Diagnosis not present

## 2015-09-14 DIAGNOSIS — E1165 Type 2 diabetes mellitus with hyperglycemia: Secondary | ICD-10-CM | POA: Diagnosis not present

## 2015-09-20 DIAGNOSIS — M67834 Other specified disorders of tendon, left wrist: Secondary | ICD-10-CM | POA: Diagnosis not present

## 2015-09-20 DIAGNOSIS — G5602 Carpal tunnel syndrome, left upper limb: Secondary | ICD-10-CM | POA: Diagnosis not present

## 2015-09-28 DIAGNOSIS — G5602 Carpal tunnel syndrome, left upper limb: Secondary | ICD-10-CM | POA: Diagnosis not present

## 2015-09-28 DIAGNOSIS — Z1231 Encounter for screening mammogram for malignant neoplasm of breast: Secondary | ICD-10-CM | POA: Diagnosis not present

## 2015-10-05 DIAGNOSIS — G5602 Carpal tunnel syndrome, left upper limb: Secondary | ICD-10-CM | POA: Diagnosis not present

## 2015-11-29 DIAGNOSIS — E119 Type 2 diabetes mellitus without complications: Secondary | ICD-10-CM | POA: Diagnosis not present

## 2015-11-29 DIAGNOSIS — E784 Other hyperlipidemia: Secondary | ICD-10-CM | POA: Diagnosis not present

## 2015-12-03 DIAGNOSIS — E538 Deficiency of other specified B group vitamins: Secondary | ICD-10-CM | POA: Diagnosis not present

## 2015-12-03 DIAGNOSIS — F419 Anxiety disorder, unspecified: Secondary | ICD-10-CM | POA: Diagnosis not present

## 2015-12-03 DIAGNOSIS — E784 Other hyperlipidemia: Secondary | ICD-10-CM | POA: Diagnosis not present

## 2015-12-03 DIAGNOSIS — D518 Other vitamin B12 deficiency anemias: Secondary | ICD-10-CM | POA: Diagnosis not present

## 2015-12-03 DIAGNOSIS — M25774 Osteophyte, right foot: Secondary | ICD-10-CM | POA: Diagnosis not present

## 2015-12-03 DIAGNOSIS — K219 Gastro-esophageal reflux disease without esophagitis: Secondary | ICD-10-CM | POA: Diagnosis not present

## 2015-12-03 DIAGNOSIS — Z0000 Encounter for general adult medical examination without abnormal findings: Secondary | ICD-10-CM | POA: Diagnosis not present

## 2015-12-03 DIAGNOSIS — Z9889 Other specified postprocedural states: Secondary | ICD-10-CM | POA: Diagnosis not present

## 2015-12-03 DIAGNOSIS — I10 Essential (primary) hypertension: Secondary | ICD-10-CM | POA: Diagnosis not present

## 2015-12-03 DIAGNOSIS — Z6836 Body mass index (BMI) 36.0-36.9, adult: Secondary | ICD-10-CM | POA: Diagnosis not present

## 2015-12-03 DIAGNOSIS — E119 Type 2 diabetes mellitus without complications: Secondary | ICD-10-CM | POA: Diagnosis not present

## 2016-02-09 DIAGNOSIS — Z6837 Body mass index (BMI) 37.0-37.9, adult: Secondary | ICD-10-CM | POA: Diagnosis not present

## 2016-02-09 DIAGNOSIS — L600 Ingrowing nail: Secondary | ICD-10-CM | POA: Diagnosis not present

## 2016-02-09 DIAGNOSIS — M6588 Other synovitis and tenosynovitis, other site: Secondary | ICD-10-CM | POA: Diagnosis not present

## 2016-04-03 DIAGNOSIS — Z6837 Body mass index (BMI) 37.0-37.9, adult: Secondary | ICD-10-CM | POA: Diagnosis not present

## 2016-04-03 DIAGNOSIS — I10 Essential (primary) hypertension: Secondary | ICD-10-CM | POA: Diagnosis not present

## 2016-04-03 DIAGNOSIS — E119 Type 2 diabetes mellitus without complications: Secondary | ICD-10-CM | POA: Diagnosis not present

## 2016-04-03 DIAGNOSIS — Z780 Asymptomatic menopausal state: Secondary | ICD-10-CM | POA: Diagnosis not present

## 2016-04-03 DIAGNOSIS — E784 Other hyperlipidemia: Secondary | ICD-10-CM | POA: Diagnosis not present

## 2016-04-14 ENCOUNTER — Other Ambulatory Visit: Payer: Self-pay | Admitting: Family Medicine

## 2016-04-25 DIAGNOSIS — M25571 Pain in right ankle and joints of right foot: Secondary | ICD-10-CM | POA: Diagnosis not present

## 2016-04-25 DIAGNOSIS — M25512 Pain in left shoulder: Secondary | ICD-10-CM | POA: Diagnosis not present

## 2016-05-01 DIAGNOSIS — G8918 Other acute postprocedural pain: Secondary | ICD-10-CM | POA: Diagnosis not present

## 2016-05-01 DIAGNOSIS — M25774 Osteophyte, right foot: Secondary | ICD-10-CM | POA: Diagnosis not present

## 2016-05-01 DIAGNOSIS — M66361 Spontaneous rupture of flexor tendons, right lower leg: Secondary | ICD-10-CM | POA: Diagnosis not present

## 2016-05-01 DIAGNOSIS — M7661 Achilles tendinitis, right leg: Secondary | ICD-10-CM | POA: Diagnosis not present

## 2016-05-01 DIAGNOSIS — M9261 Juvenile osteochondrosis of tarsus, right ankle: Secondary | ICD-10-CM | POA: Diagnosis not present

## 2016-05-09 DIAGNOSIS — M25774 Osteophyte, right foot: Secondary | ICD-10-CM | POA: Diagnosis not present

## 2016-05-16 DIAGNOSIS — M25774 Osteophyte, right foot: Secondary | ICD-10-CM | POA: Diagnosis not present

## 2016-05-17 ENCOUNTER — Ambulatory Visit: Payer: Medicare Other | Attending: Orthopedic Surgery | Admitting: Physical Therapy

## 2016-05-17 DIAGNOSIS — M25571 Pain in right ankle and joints of right foot: Secondary | ICD-10-CM | POA: Diagnosis not present

## 2016-05-17 DIAGNOSIS — R2689 Other abnormalities of gait and mobility: Secondary | ICD-10-CM | POA: Insufficient documentation

## 2016-05-17 DIAGNOSIS — M25671 Stiffness of right ankle, not elsewhere classified: Secondary | ICD-10-CM | POA: Diagnosis not present

## 2016-05-17 DIAGNOSIS — R262 Difficulty in walking, not elsewhere classified: Secondary | ICD-10-CM | POA: Insufficient documentation

## 2016-05-17 NOTE — Therapy (Signed)
[facility] [address]  [place], [place], [address] Phone: [phone]   Fax:  [phone]  Physical Therapy Evaluation  Patient Details  Name: Debra Wright MRN: [id number] Date of Birth: 21-Jan-1951 Referring Provider: Kathryne Hitch, MD  Encounter Date: 05/17/2016      PT End of Session - 05/17/16 1231    Visit Number 1   Number of Visits 20   Date for PT Re-Evaluation 08/09/16   Authorization Type Medicare   PT Start Time 1150   PT Stop Time 1231   PT Time Calculation (min) 41 min   Activity Tolerance Patient tolerated treatment well   Behavior During Therapy [facility] for tasks assessed/performed      Past Medical History:  Diagnosis Date  . Anxiety   . Arthritis    "knees; right thumb" (10/14/2014)  . Basal cell carcinoma    "burned off upper lip & right shoulder"  . Depression   . GERD (gastroesophageal reflux disease)   . High cholesterol   . Hypertension   . Migraine    "frequently when I was younger; maybe monthly now" (10/14/2014)  . Type II diabetes mellitus     Past Surgical History:  Procedure Laterality Date  . ABDOMINAL HYSTERECTOMY  1988  . APPENDECTOMY  1988  . BILATERAL OOPHORECTOMY  2004  . CARPAL TUNNEL RELEASE Bilateral 1986  . CATARACT EXTRACTION W/ INTRAOCULAR LENS  IMPLANT, BILATERAL Bilateral 2011  . JOINT REPLACEMENT    . SHOULDER ARTHROSCOPY DISTAL CLAVICLE EXCISION AND OPEN ROTATOR CUFF REPAIR Right 11/2013  . TONSILLECTOMY  1950's  . TOTAL KNEE ARTHROPLASTY Left 10/13/2014   Procedure: LEFT TOTAL KNEE ARTHROPLASTY;  Surgeon: Mcarthur Rossetti, MD;  Location: [place];  Service: Orthopedics;  Laterality: Left;    There were no vitals filed for this visit.       Subjective Assessment - 05/17/16 1156    Subjective Pt reports h/o bone spurs for years and would receive injections which relieved pain. Bone spurs eventually started to wear down achilles tendon, so she underwent  surgery to clean out the spurs and repair the damage to the tendon. Saw MD yesterday and was supposed to have stitches removed, but unable to as she was told the incision was "too wet" and draining.   Pertinent History R Achilles tendon repair 05/01/16; L TKR 10/13/14; R knee OA in need of TKR   Limitations Walking;Standing   How long can you stand comfortably? 10 minutes   How long can you walk comfortably? 15-20 ft   Patient Stated Goals "back to walking normal w/o pain"   Currently in Pain? Yes   Pain Score 1   Least 0/10, Avg 1/10, Worst 8/10   Pain Location Heel   Pain Orientation Right;Posterior   Pain Descriptors / Indicators Burning;Other (Comment)  Stinging   Pain Type Surgical pain   Pain Onset 1 to 4 weeks ago   Pain Frequency Intermittent   Aggravating Factors  Walking    Pain Relieving Factors Rest, elevation, ice, pain meds   Effect of Pain on Daily Activities limited standing and walking tolerance            [facility] PT Assessment - 05/17/16 1150      Assessment   Medical Diagnosis R achilles tendon repair   Referring Provider Kathryne Hitch, MD   Onset Date/Surgical Date 05/01/16   Next MD Visit 05/23/16   Prior Therapy OP PT for L TKR early  2016     Precautions   Required Braces or Orthoses Other Brace/Splint   Other Brace/Splint Cam walker on R     Restrictions   Weight Bearing Restrictions Yes   RLE Weight Bearing Weight bearing as tolerated     Balance Screen   Has the patient fallen in the past 6 months No   Has the patient had a decrease in activity level because of a fear of falling?  No   Is the patient reluctant to leave their home because of a fear of falling?  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, meal prep     Observation/Other Assessments   Focus on Therapeutic Outcomes (FOTO)  Ankle - 36% (64% limitation); Predicted 52% (48% limitation)     ROM / Strength   AROM / PROM / Strength AROM;PROM;Strength     AROM   AROM Assessment Site Ankle   Right/Left Ankle Right;Left   Right Ankle Dorsiflexion 3   Right Ankle Plantar Flexion 27   Right Ankle Inversion 18   Right Ankle Eversion 8   Left Ankle Dorsiflexion 11   Left Ankle Plantar Flexion 42   Left Ankle Inversion 34   Left Ankle Eversion 12     PROM   PROM Assessment Site Ankle   Right/Left Ankle Right   Right Ankle Inversion 26   Right Ankle Eversion 14     Strength   Strength Assessment Site Hip;Knee   Right/Left Hip Right;Left   Right Hip Flexion 4/5   Right Hip Extension 4-/5   Right Hip ABduction 4/5   Right Hip ADduction 4-/5   Left Hip Flexion 4+/5   Left Hip Extension 4-/5   Left Hip ABduction 4+/5   Left Hip ADduction 4/5   Right/Left Knee Right;Left   Right Knee Flexion 4+/5   Right Knee Extension 4+/5   Left Knee Flexion 4+/5   Left Knee Extension 4+/5     Flexibility   Soft Tissue Assessment /Muscle Length yes   Hamstrings WFL   Quadriceps mild tightness R > L   ITB WFL   Piriformis mild tightness B     Palpation   Palpation comment banadge in place over incision due to continued drainage; increased edema noted in posterior ankle laterally > medially     Ambulation/Gait   Assistive device None   Gait Pattern Step-to pattern;Decreased stance time - right;Decreased stride length;Decreased weight shift to right;Right circumduction;Right hip hike;Antalgic;Lateral trunk lean to left        Today's Treatment  TherEx Instructed pt in toe curls/spreads  Further HEP deferred as pt needing to leave for another appt  Gait Attempted gait with SPC to offset Cam walker but pt not noticing appreciable difference          PT Education - 05/17/16 1230    Education provided Yes   Education Details PT eval findings, POC, use of ice for pain/edema management, toe curls/spreads   Person(s) Educated Patient   Methods Explanation;Demonstration   Comprehension Verbalized understanding;Returned demonstration          PT Short Term Goals -  2016-05-18 1231      PT SHORT TERM GOAL #1   Title Independent with initial HEP by 06/02/16           PT Long Term Goals - 05/18/16 1231      PT LONG TERM GOAL #1   Title Independent with advanced HEP by 08/09/16   Status New     PT LONG TERM GOAL #2   Title R ankle ROM WNL w/o pain by 08/09/16   Status New     PT LONG TERM GOAL #3   Title R ankle strength >/= 4+/5 by 08/09/16   Status New     PT LONG TERM GOAL #4   Title B hip/knee strength >/= 4+/5 by 08/09/16   Status New     PT LONG TERM GOAL #5   Title Able to walk community distances w/o increased R ankle/heel pain by 08/09/16   Status New               Plan - May 18, 2016 1231    Clinical Impression Statement Debra Wright is a 65 y/o female who presents to OP PT 16 days post-op from R Achilles tendon repair removal of associated bone spurs on 05/01/16. Pt arrives to PT walking in R Cam walker w/o AD. Current R ankle/heel pain is 1/10 which pt describes as stinging/burning sensation in posterior heel. PT reports least pain 0/10, average pain 1/10 & worst pain up to 8/10 after prolonged walking or standing. Assessment reveals limited R ankle ROM in all planes, with greatest restriction in DF & eversion.  MMT reveals mild weakness in proximal LE muscles, R slightly > L (refer to above MMT). LE flexibility essentially WFL other than mild tightness in B piriformis and quads R > L. Pt reports greatest limitation presently is tolerance for standing and walking which limits her ability to provide assistance for her disabled husband (severe spinal stenosis). POC will follow Achilles tendon repair protocol focusing on  reducing R ankle pain/inflammation and edema, R ankle ROM, R ankle and core/proximal strengthening and stability training, proprioceptive training and gait training, with manual therapy and modalities PRN.   Rehab Potential Good   PT Frequency 2x / week   PT Duration Other (comment)  10 weeks   PT Treatment/Interventions Patient/family education;Therapeutic exercise;Neuromuscular re-education;Gait training;Manual techniques;Passive range of motion;Electrical Stimulation;Cryotherapy;Vasopneumatic Device   PT Next Visit Plan Create initial HEP; Achilles tendon repair protocol (surgery 05/01/16) - Gentle R ankle AROM; Proximal LE strengthening; Manual therapy and modalities as needed for pain/edema   Consulted and Agree with Plan of Care Patient      Patient will benefit from skilled therapeutic intervention in order to improve the following deficits and impairments:  Difficulty walking, Abnormal gait, Decreased range of motion, Impaired flexibility, Decreased strength, Decreased scar mobility, Decreased activity tolerance  Visit Diagnosis: Stiffness of right ankle, not elsewhere classified  Pain in right ankle and joints of right foot  Difficulty in walking, not elsewhere classified  Other abnormalities of gait and mobility      G-Codes - 05-18-2016 1231    Functional Assessment Tool Used Ankle FOTO = 36% (64% limitation)   Functional Limitation Mobility: Walking and moving around   Mobility: Walking and Moving Around Current Status [id number]) At least 60 percent but less than 80 percent impaired, limited or restricted   Mobility: Walking and Moving Around Goal Status [id number]) At least 40 percent but less than 60 percent impaired, limited or restricted  Problem List Patient Active Problem List   Diagnosis Date Noted  . Arthritis of left knee 10/13/2014  . Status post total left knee replacement 10/13/2014    Percival Spanish, PT, MPT 05/17/2016, 1:34 PM  [facility] [address]  [address] [place], [place], [address] Phone: [phone]   Fax:  [phone]  Name: Debra Wright MRN: [id number] Date of Birth: 10-01-1951

## 2016-05-23 ENCOUNTER — Ambulatory Visit: Payer: Medicare Other

## 2016-05-23 DIAGNOSIS — M25774 Osteophyte, right foot: Secondary | ICD-10-CM | POA: Diagnosis not present

## 2016-05-25 ENCOUNTER — Ambulatory Visit: Payer: Medicare Other

## 2016-05-25 DIAGNOSIS — M25671 Stiffness of right ankle, not elsewhere classified: Secondary | ICD-10-CM

## 2016-05-25 DIAGNOSIS — R2689 Other abnormalities of gait and mobility: Secondary | ICD-10-CM

## 2016-05-25 DIAGNOSIS — R262 Difficulty in walking, not elsewhere classified: Secondary | ICD-10-CM

## 2016-05-25 DIAGNOSIS — M25571 Pain in right ankle and joints of right foot: Secondary | ICD-10-CM

## 2016-05-25 NOTE — Therapy (Signed)
[facility] [address]  [place], [place], [address] Phone: [phone]   Fax:  [phone]  Physical Therapy Treatment  Patient Details  Name: Debra Wright MRN: [id number] Date of Birth: [birth year] Referring Provider: Kathryne Hitch, MD  Encounter Date: 05/25/2016      PT End of Session - 05/25/16 1335    Visit Number 2   Number of Visits 20   Date for PT Re-Evaluation 08/09/16   Authorization Type Medicare   PT Start Time [id number]  Pt. arrived 10 min late    PT Stop Time 1400   PT Time Calculation (min) 35 min   Activity Tolerance Patient tolerated treatment well   Behavior During Therapy [facility] for tasks assessed/performed      Past Medical History:  Diagnosis Date  . Anxiety   . Arthritis    "knees; right thumb" (10/14/2014)  . Basal cell carcinoma    "burned off upper lip & right shoulder"  . Depression   . GERD (gastroesophageal reflux disease)   . High cholesterol   . Hypertension   . Migraine    "frequently when I was younger; maybe monthly now" (10/14/2014)  . Type II diabetes mellitus     Past Surgical History:  Procedure Laterality Date  . ABDOMINAL HYSTERECTOMY  1988  . APPENDECTOMY  1988  . BILATERAL OOPHORECTOMY  2004  . CARPAL TUNNEL RELEASE Bilateral 1986  . CATARACT EXTRACTION W/ INTRAOCULAR LENS  IMPLANT, BILATERAL Bilateral 2011  . JOINT REPLACEMENT    . SHOULDER ARTHROSCOPY DISTAL CLAVICLE EXCISION AND OPEN ROTATOR CUFF REPAIR Right 11/2013  . TONSILLECTOMY  1950's  . TOTAL KNEE ARTHROPLASTY Left 10/13/2014   Procedure: LEFT TOTAL KNEE ARTHROPLASTY;  Surgeon: Mcarthur Rossetti, MD;  Location: [facility];  Service: Orthopedics;  Laterality: Left;    There were no vitals filed for this visit.      Subjective Assessment - 05/25/16 1327    Subjective Pt. reports she had most of her stitches removed on 8/22, not all, due to, "oozing" along incisions.  Pt. reports she is pain free when  seated and 3/10 pain initially today with standing and walking.   Patient Stated Goals "back to walking normal w/o pain"   Currently in Pain? Yes   Pain Score 3    Pain Location Heel   Pain Orientation Right;Posterior   Pain Descriptors / Indicators Burning   Pain Type Surgical pain   Pain Onset 1 to 4 weeks ago   Aggravating Factors  walking    Multiple Pain Sites No            [facility] PT Assessment - 05/25/16 1333      Assessment   Medical Diagnosis R achilles tendon repair    Referring Provider Kathryne Hitch, MD   Next MD Visit 05/30/16      Today's treatment:  Therex: B HS curl with heels on peanut p-ball x 2 x 20 reps  SL bridge with heels on peanut p-ball 2 x 10 reps L sidelying R hip abduction x 10 reps Supine R SLR x 15 reps  R sidelying R hip adduction x 10 reps  Prone lying R hip extension x 10 reps Seated R HS curl with black TB x 15 reps Seated B hip abd/ER with black looped TB x 15 reps  Seated LE march with black looped TB x 10 reps each          PT Short Term  Goals - 05/17/16 1231      PT SHORT TERM GOAL #1   Title Independent with initial HEP by 06/02/16           PT Long Term Goals - 05/25/16 1337      PT LONG TERM GOAL #1   Title Independent with advanced HEP by 08/09/16   Status On-going     PT LONG TERM GOAL #2   Title R ankle ROM WNL w/o pain by 08/09/16   Status On-going     PT LONG TERM GOAL #3   Title R ankle strength >/= 4+/5 by 08/09/16   Status On-going     PT LONG TERM GOAL #4   Title B hip/knee strength >/= 4+/5 by 08/09/16   Status On-going     PT LONG TERM GOAL #5   Title Able to walk community distances w/o increased R ankle/heel pain by 08/09/16   Status On-going               Plan - 05/25/16 1336    Clinical Impression Statement Pt. reports she had most of her stitches removed on 8/22, not all, due to, "oozing" along incisions.  Pt. reports she is pain free when seated and 3/10 pain initially today with  standing and walking.  Today's treatment focused on hip/core strengthening activity due to limitations with protocol at this time.  Pt. tolerated all hip/core/LE activities well.  HEP issued to pt. with stretching and strengthening activities included via handout with black looped TB.  Plan to monitor pt. response to HEP next treatment.  Pt. to return see PT on 8/28.  Pt. with MD f/u on 8/29.     PT Treatment/Interventions Patient/family education;Therapeutic exercise;Neuromuscular re-education;Gait training;Manual techniques;Passive range of motion;Electrical Stimulation;Cryotherapy;Vasopneumatic Device   PT Next Visit Plan monitor response to initial HEP; Achilles tendon repair protocol (surgery 05/01/16) - Gentle R ankle AROM; Proximal LE strengthening; Manual therapy and modalities as needed for pain/edema      Patient will benefit from skilled therapeutic intervention in order to improve the following deficits and impairments:  Difficulty walking, Abnormal gait, Decreased range of motion, Impaired flexibility, Decreased strength, Decreased scar mobility, Decreased activity tolerance  Visit Diagnosis: Stiffness of right ankle, not elsewhere classified  Pain in right ankle and joints of right foot  Difficulty in walking, not elsewhere classified  Other abnormalities of gait and mobility     Problem List Patient Active Problem List   Diagnosis Date Noted  . Arthritis of left knee 10/13/2014  . Status post total left knee replacement 10/13/2014    Bess Harvest, PTA 05/25/2016, 3:21 PM  [facility] [address]  [place], [place], [address] Phone: [phone]   Fax:  [phone]  Name: Debra Wright MRN: [id number] Date of Birth: Feb 22, 1951

## 2016-05-26 DIAGNOSIS — M25774 Osteophyte, right foot: Secondary | ICD-10-CM | POA: Diagnosis not present

## 2016-05-29 ENCOUNTER — Encounter: Payer: Medicare Other | Admitting: Physical Therapy

## 2016-05-30 DIAGNOSIS — M25774 Osteophyte, right foot: Secondary | ICD-10-CM | POA: Diagnosis not present

## 2016-06-01 ENCOUNTER — Ambulatory Visit: Payer: Medicare Other

## 2016-06-06 DIAGNOSIS — M25774 Osteophyte, right foot: Secondary | ICD-10-CM | POA: Diagnosis not present

## 2016-06-08 ENCOUNTER — Encounter: Payer: Medicare Other | Admitting: Physical Therapy

## 2016-06-12 ENCOUNTER — Ambulatory Visit: Payer: Medicare Other

## 2016-06-15 ENCOUNTER — Encounter: Payer: Medicare Other | Admitting: Physical Therapy

## 2016-06-19 ENCOUNTER — Ambulatory Visit: Payer: Medicare Other | Admitting: Physical Therapy

## 2016-06-20 DIAGNOSIS — M25774 Osteophyte, right foot: Secondary | ICD-10-CM | POA: Diagnosis not present

## 2016-06-22 ENCOUNTER — Encounter: Payer: Medicare Other | Admitting: Physical Therapy

## 2016-06-28 ENCOUNTER — Ambulatory Visit: Payer: Medicare Other | Attending: Orthopedic Surgery | Admitting: Physical Therapy

## 2016-06-28 ENCOUNTER — Encounter: Payer: Self-pay | Admitting: Physical Therapy

## 2016-06-28 DIAGNOSIS — M25571 Pain in right ankle and joints of right foot: Secondary | ICD-10-CM | POA: Diagnosis not present

## 2016-06-28 DIAGNOSIS — R2241 Localized swelling, mass and lump, right lower limb: Secondary | ICD-10-CM | POA: Insufficient documentation

## 2016-06-28 DIAGNOSIS — M25671 Stiffness of right ankle, not elsewhere classified: Secondary | ICD-10-CM | POA: Diagnosis not present

## 2016-06-28 DIAGNOSIS — R262 Difficulty in walking, not elsewhere classified: Secondary | ICD-10-CM | POA: Insufficient documentation

## 2016-06-28 NOTE — Therapy (Signed)
[facility] [place] [address] [place], [place], [address] Phone: [phone]   Fax:  [phone]  Physical Therapy Evaluation  Patient Details  Name: Debra Wright MRN: [id number] Date of Birth: Feb 06, 1951 Referring Provider: Kathryne Hitch  Encounter Date: 06/28/2016      PT End of Session - 06/28/16 1432    Visit Number 1   Date for PT Re-Evaluation 08/28/16   Authorization Type Medicare   PT Start Time 1315   PT Stop Time 1401   PT Time Calculation (min) 46 min   Activity Tolerance Patient tolerated treatment well   Behavior During Therapy [facility] for tasks assessed/performed      Past Medical History:  Diagnosis Date  . Anxiety   . Arthritis    "knees; right thumb" (10/14/2014)  . Basal cell carcinoma    "burned off upper lip & right shoulder"  . Depression   . GERD (gastroesophageal reflux disease)   . High cholesterol   . Hypertension   . Migraine    "frequently when I was younger; maybe monthly now" (10/14/2014)  . Type II diabetes mellitus ([place])     Past Surgical History:  Procedure Laterality Date  . ABDOMINAL HYSTERECTOMY  1988  . APPENDECTOMY  1988  . BILATERAL OOPHORECTOMY  2004  . CARPAL TUNNEL RELEASE Bilateral 1986  . CATARACT EXTRACTION W/ INTRAOCULAR LENS  IMPLANT, BILATERAL Bilateral 2011  . JOINT REPLACEMENT    . SHOULDER ARTHROSCOPY DISTAL CLAVICLE EXCISION AND OPEN ROTATOR CUFF REPAIR Right 11/2013  . TONSILLECTOMY  1950's  . TOTAL KNEE ARTHROPLASTY Left 10/13/2014   Procedure: LEFT TOTAL KNEE ARTHROPLASTY;  Surgeon: Mcarthur Rossetti, MD;  Location: [facility];  Service: Orthopedics;  Laterality: Left;    There were no vitals filed for this visit.       Subjective Assessment - 06/28/16 1319    Subjective Patient had a right achilles repair and spur removal  on 05/01/16.  She started PT at our [facility] office but was advised to stop by the MD due to infection at the surgical site.  She reports that  she was placed on antibiotics and reports overall feeling fatigues, she reports that she tried to continue some of the small exercises but reports that she has not done them much.  She returns with seeing MD last week and witht he okay to resume PT.   Pertinent History R Achilles tendon repair 05/01/16; L TKR 10/13/14; R knee OA in need of TKR   Limitations Walking;Standing   How long can you walk comfortably? 75 feet with SPC   Currently in Pain? Yes   Pain Score 4    Pain Location Knee   Pain Orientation Right   Pain Descriptors / Indicators Aching   Pain Type Acute pain   Pain Onset More than a month ago   Pain Frequency Intermittent   Aggravating Factors  reports the knee pain was due to wearing the cam boot, she stopped wearing the boot 2 weeks ago   Pain Relieving Factors rest and pain meds   Effect of Pain on Daily Activities difficulty walking and stnading            [facility] PT Assessment - 06/28/16 0001      Assessment   Medical Diagnosis R achilles tendon repair    Referring Provider Kathryne Hitch   Onset Date/Surgical Date 05/01/16   Next MD Visit 08/01/16   Prior Therapy OP PT for L TKR  early 2016     Restrictions   Weight Bearing Restrictions No     Balance Screen   Has the patient fallen in the past 6 months No   Has the patient had a decrease in activity level because of a fear of falling?  No   Is the patient reluctant to leave their home because of a fear of falling?  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, meal prep   Leisure no exericse     AROM   Right Ankle Dorsiflexion 9   Right Ankle Plantar Flexion 36   Right Ankle Inversion 20   Right  Ankle Eversion 10     Strength   Right Hip Flexion 4/5   Right Hip ABduction 4/5   Right Hip ADduction 4-/5   Left Hip Flexion 4/5   Left Hip Extension 4-/5     Flexibility   Soft Tissue Assessment /Muscle Length --  tightness in the calves, HS and piriformis     Palpation   Palpation comment bandage in place, she has a large knot in over the achilles, there is eschar that is still present with some reddened tissue around                   [facility] Adult PT Treatment/Exercise - 06/28/16 0001      Ambulation/Gait   Gait Comments ambulates with SPC, no boot, FWB, able to walk 100 feet prior to rest, slow step-to type gait, decreased toe off, slight ER of the right hip, her first few steps after getting up are very poor with significant antalgia, then it gets a little better after 5 steps     Exercises   Exercises Ankle     Ankle Exercises: Aerobic   T[place] Mill NuStep Level 1 x 5 minutes     Ankle Exercises: Seated   ABC's 1 rep   Heel Raises 20 reps   Toe Raise 20 reps   Other Seated Ankle Exercises foot on sit fit ankle PF/DF, inv/eversion and circles CW and CCW                PT Education - 06/28/16 1431    Education provided Yes   Education Details following protocol added seated heel and toe raises   Person(s) Educated Patient   Methods Explanation;Demonstration   Comprehension Verbalized understanding          PT Short Term Goals - 06/28/16 1435      PT SHORT TERM GOAL #1   Title Independent with initial HEP by 07/11/16   Time 2   Period Weeks   Status New           PT Long Term Goals - 06/28/16 1435      PT LONG TERM GOAL #1   Title Independent with advanced HEP by 09/08/16   Time 8   Period Weeks   Status New     PT LONG TERM GOAL #2   Title R ankle ROM WNL w/o pain by 09/08/16   Time 8   Period Weeks   Status New     PT  LONG TERM GOAL #3   Title R ankle strength >/= 4+/5 by 08/09/16   Time 8   Period Weeks   Status New      PT LONG TERM GOAL #4   Title B hip/knee strength >/= 4+/5 by 08/09/16   Time 8   Period Weeks   Status New     PT LONG TERM GOAL #5   Title Able to walk community distances w/o increased R ankle/heel pain by 08/09/16   Time 8   Period Weeks   Status New               Plan - 06/28/16 1432    Clinical Impression Statement Patient underwent a right achilles repair with spur removal on 05/01/16.  She started PT but stopped after 2 visits due to infection, she saw the MD last week and was given referral to resume treatment, now out of cam boot, she is at 8 weeks post op but again had the set back of no PT for 1 month due to infection.     Rehab Potential Good   Clinical Impairments Affecting Rehab Potential R knee OA in need of TKR, s/p L TKR in 2016   PT Frequency 2x / week   PT Duration 8 weeks   PT Treatment/Interventions Patient/family education;Therapeutic exercise;Neuromuscular re-education;Gait training;Manual techniques;Passive range of motion;Electrical Stimulation;Cryotherapy;Vasopneumatic Device   PT Next Visit Plan Patient is 8 weeks post op, has protocol in chart to follow, I feel we should start at week 6 this week and progress as she was delayed.   Consulted and Agree with Plan of Care Patient      Patient will benefit from skilled therapeutic intervention in order to improve the following deficits and impairments:  Difficulty walking, Abnormal gait, Decreased range of motion, Impaired flexibility, Decreased strength, Decreased scar mobility, Decreased activity tolerance  Visit Diagnosis: Stiffness of right ankle, not elsewhere classified - Plan: PT plan of care cert/re-cert  Pain in right ankle and joints of right foot - Plan: PT plan of care cert/re-cert  Difficulty in walking, not elsewhere classified - Plan: PT plan of care cert/re-cert  Localized swelling, mass and lump, right lower limb - Plan: PT plan of care cert/re-cert      G-Codes - [id number]     Functional Assessment Tool Used foto 64% limitation   Functional Limitation Mobility: Walking and moving around   Mobility: Walking and Moving Around Current Status [phone]) At least 60 percent but less than 80 percent impaired, limited or restricted   Mobility: Walking and Moving Around Goal Status [phone]) At least 40 percent but less than 60 percent impaired, limited or restricted       Problem List Patient Active Problem List   Diagnosis Date Noted  . Arthritis of left knee 10/13/2014  . Status post total left knee replacement 10/13/2014    Sumner Boast., PT 06/28/2016, 2:47 PM  [facility] [place] [address] [place], [place], [address] Phone: [phone]   Fax:  [phone]  Name: Alyce Crescenzo MRN: [id number] Date of Birth: [birth year]

## 2016-06-29 ENCOUNTER — Encounter: Payer: Medicare Other | Admitting: Physical Therapy

## 2016-07-04 ENCOUNTER — Ambulatory Visit: Payer: Medicare Other | Admitting: Physical Therapy

## 2016-07-05 DIAGNOSIS — M25774 Osteophyte, right foot: Secondary | ICD-10-CM | POA: Diagnosis not present

## 2016-07-06 ENCOUNTER — Ambulatory Visit: Payer: Medicare Other | Attending: Orthopedic Surgery | Admitting: Physical Therapy

## 2016-07-06 DIAGNOSIS — M25571 Pain in right ankle and joints of right foot: Secondary | ICD-10-CM | POA: Insufficient documentation

## 2016-07-06 DIAGNOSIS — R2241 Localized swelling, mass and lump, right lower limb: Secondary | ICD-10-CM | POA: Insufficient documentation

## 2016-07-06 DIAGNOSIS — M25671 Stiffness of right ankle, not elsewhere classified: Secondary | ICD-10-CM | POA: Insufficient documentation

## 2016-07-06 DIAGNOSIS — R262 Difficulty in walking, not elsewhere classified: Secondary | ICD-10-CM | POA: Insufficient documentation

## 2016-07-06 DIAGNOSIS — M25562 Pain in left knee: Secondary | ICD-10-CM | POA: Insufficient documentation

## 2016-07-06 DIAGNOSIS — G8929 Other chronic pain: Secondary | ICD-10-CM | POA: Insufficient documentation

## 2016-07-11 ENCOUNTER — Encounter: Payer: Self-pay | Admitting: Physical Therapy

## 2016-07-11 ENCOUNTER — Ambulatory Visit: Payer: Medicare Other | Admitting: Physical Therapy

## 2016-07-11 DIAGNOSIS — M25571 Pain in right ankle and joints of right foot: Secondary | ICD-10-CM

## 2016-07-11 DIAGNOSIS — R262 Difficulty in walking, not elsewhere classified: Secondary | ICD-10-CM

## 2016-07-11 DIAGNOSIS — M25671 Stiffness of right ankle, not elsewhere classified: Secondary | ICD-10-CM

## 2016-07-11 DIAGNOSIS — R2241 Localized swelling, mass and lump, right lower limb: Secondary | ICD-10-CM | POA: Diagnosis not present

## 2016-07-11 DIAGNOSIS — G8929 Other chronic pain: Secondary | ICD-10-CM | POA: Diagnosis not present

## 2016-07-11 DIAGNOSIS — D225 Melanocytic nevi of trunk: Secondary | ICD-10-CM | POA: Diagnosis not present

## 2016-07-11 DIAGNOSIS — Z1283 Encounter for screening for malignant neoplasm of skin: Secondary | ICD-10-CM | POA: Diagnosis not present

## 2016-07-11 DIAGNOSIS — M25562 Pain in left knee: Secondary | ICD-10-CM | POA: Diagnosis not present

## 2016-07-11 DIAGNOSIS — D485 Neoplasm of uncertain behavior of skin: Secondary | ICD-10-CM | POA: Diagnosis not present

## 2016-07-11 DIAGNOSIS — L218 Other seborrheic dermatitis: Secondary | ICD-10-CM | POA: Diagnosis not present

## 2016-07-11 DIAGNOSIS — D2362 Other benign neoplasm of skin of left upper limb, including shoulder: Secondary | ICD-10-CM | POA: Diagnosis not present

## 2016-07-11 NOTE — Therapy (Signed)
[place] [address] [place], [place], [address] Phone: [phone]   Fax:  [phone]  Physical Therapy Treatment  Patient Details  Name: Debra Wright MRN: [id number] Date of Birth: 1951/05/25 Referring Provider: Kathryne Hitch  Encounter Date: 07/11/2016      PT End of Session - 07/11/16 1523    Visit Number 2   Number of Visits 20   Date for PT Re-Evaluation 08/28/16   PT Start Time 1430   PT Stop Time 1517   PT Time Calculation (min) 47 min   Activity Tolerance Patient tolerated treatment well   Behavior During Therapy [facility] for tasks assessed/performed      Past Medical History:  Diagnosis Date  . Anxiety   . Arthritis    "knees; right thumb" (10/14/2014)  . Basal cell carcinoma    "burned off upper lip & right shoulder"  . Depression   . GERD (gastroesophageal reflux disease)   . High cholesterol   . Hypertension   . Migraine    "frequently when I was younger; maybe monthly now" (10/14/2014)  . Type II diabetes mellitus ([place])     Past Surgical History:  Procedure Laterality Date  . ABDOMINAL HYSTERECTOMY  1988  . APPENDECTOMY  1988  . BILATERAL OOPHORECTOMY  2004  . CARPAL TUNNEL RELEASE Bilateral 1986  . CATARACT EXTRACTION W/ INTRAOCULAR LENS  IMPLANT, BILATERAL Bilateral 2011  . JOINT REPLACEMENT    . SHOULDER ARTHROSCOPY DISTAL CLAVICLE EXCISION AND OPEN ROTATOR CUFF REPAIR Right 11/2013  . TONSILLECTOMY  1950's  . TOTAL KNEE ARTHROPLASTY Left 10/13/2014   Procedure: LEFT TOTAL KNEE ARTHROPLASTY;  Surgeon: Mcarthur Rossetti, MD;  Location: [place];  Service: Orthopedics;  Laterality: Left;    There were no vitals filed for this visit.      Subjective Assessment - 07/11/16 1431    Subjective Pt reports that last week was rough, reports having a spider bite and was on antibiotics.   Currently in Pain? Yes   Pain Score 4    Pain Location Ankle   Pain Orientation Posterior;Right                          [facility] Adult PT Treatment/Exercise - 07/11/16 0001      Manual Therapy   Manual Therapy Passive ROM   Passive ROM R ankle PROM all directions     Ankle Exercises: Aerobic   Tread Mill NuStep Level 4 x 6 minutes     Ankle Exercises: Stretches   Other Stretch Towel stretch 3x10 sec     Ankle Exercises: Seated   ABC's 2 reps   Ankle Circles/Pumps --  x50   Heel Raises 20 reps   Toe Raise 20 reps   Other Seated Ankle Exercises foot on sit fit ankle PF/DF, inv/eversion and circles CW and CCW   Other Seated Ankle Exercises T-band ankle 4 way red x20     Ankle Exercises: Machines for Strengthening   Cybex Leg Press 20lb 3x10     Ankle Exercises: Standing   Other Standing Ankle Exercises sit to stand 2x10 UE support                   PT Short Term Goals - 06/28/16 1435      PT SHORT TERM GOAL #1   Title Independent with initial HEP by 07/11/16   Time 2   Period [place]  Status New           PT Long Term Goals - 06/28/16 1435      PT LONG TERM GOAL #1   Title Independent with advanced HEP by 09/08/16   Time 8   Period Weeks   Status New     PT LONG TERM GOAL #2   Title R ankle ROM WNL w/o pain by 09/08/16   Time 8   Period Weeks   Status New     PT LONG TERM GOAL #3   Title R ankle strength >/= 4+/5 by 08/09/16   Time 8   Period Weeks   Status New     PT LONG TERM GOAL #4   Title B hip/knee strength >/= 4+/5 by 08/09/16   Time 8   Period Weeks   Status New     PT LONG TERM GOAL #5   Title Able to walk community distances w/o increased R ankle/heel pain by 08/09/16   Time 8   Period Weeks   Status New               Plan - 07/11/16 1527    Clinical Impression Statement Pt is behind on protocol due to a few complications. Pt able to tolerated week 6 of protocol well. Does have some pain at end range of PROM. Pt enters clinic walking with an antalgic gait, decrease stance time on RLE. Was going to try  ionto on pt R achillis but pt had an open wound on surgical site.   Rehab Potential Good   Clinical Impairments Affecting Rehab Potential R knee OA in need of TKR, s/p L TKR in 2016   PT Frequency 2x / week   PT Duration 8 weeks   PT Treatment/Interventions Patient/family education;Therapeutic exercise;Neuromuscular re-education;Gait training;Manual techniques;Passive range of motion;Electrical Stimulation;Cryotherapy;Vasopneumatic Device   PT Next Visit Plan week 6 of protocol, try ionto if wound has healed      Patient will benefit from skilled therapeutic intervention in order to improve the following deficits and impairments:  Difficulty walking, Abnormal gait, Decreased range of motion, Impaired flexibility, Decreased strength, Decreased scar mobility, Decreased activity tolerance  Visit Diagnosis: Stiffness of right ankle, not elsewhere classified  Pain in right ankle and joints of right foot  Difficulty in walking, not elsewhere classified  Localized swelling, mass and lump, right lower limb     Problem List Patient Active Problem List   Diagnosis Date Noted  . Arthritis of left knee 10/13/2014  . Status post total left knee replacement 10/13/2014    Scot Jun, PTA 07/11/2016, 3:38 PM  [place] [address] [place], [place], [address] Phone: [phone]   Fax:  [phone]  Name: Debra Wright MRN: [id number] Date of Birth: [birth year]

## 2016-07-13 ENCOUNTER — Encounter: Payer: Self-pay | Admitting: Physical Therapy

## 2016-07-13 ENCOUNTER — Ambulatory Visit: Payer: Medicare Other | Admitting: Physical Therapy

## 2016-07-13 DIAGNOSIS — M25562 Pain in left knee: Secondary | ICD-10-CM | POA: Diagnosis not present

## 2016-07-13 DIAGNOSIS — R262 Difficulty in walking, not elsewhere classified: Secondary | ICD-10-CM

## 2016-07-13 DIAGNOSIS — M25571 Pain in right ankle and joints of right foot: Secondary | ICD-10-CM

## 2016-07-13 DIAGNOSIS — R2241 Localized swelling, mass and lump, right lower limb: Secondary | ICD-10-CM | POA: Diagnosis not present

## 2016-07-13 DIAGNOSIS — G8929 Other chronic pain: Secondary | ICD-10-CM | POA: Diagnosis not present

## 2016-07-13 DIAGNOSIS — M25671 Stiffness of right ankle, not elsewhere classified: Secondary | ICD-10-CM | POA: Diagnosis not present

## 2016-07-13 NOTE — Therapy (Signed)
[place] [address] [address] [place], [place], [address] Phone: [phone]   Fax:  [phone]  Physical Therapy Treatment  Patient Details  Name: Debra Wright MRN: [id number] Date of Birth: 1950/12/05 Referring Provider: Kathryne Hitch  Encounter Date: 07/13/2016      PT End of Session - 07/13/16 1513    Visit Number 3   Number of Visits 20   Date for PT Re-Evaluation 08/28/16   Authorization Type Medicare   PT Start Time 1432   PT Stop Time 1514   PT Time Calculation (min) 42 min   Activity Tolerance Patient tolerated treatment well   Behavior During Therapy [facility] for tasks assessed/performed      Past Medical History:  Diagnosis Date  . Anxiety   . Arthritis    "knees; right thumb" (10/14/2014)  . Basal cell carcinoma    "burned off upper lip & right shoulder"  . Depression   . GERD (gastroesophageal reflux disease)   . High cholesterol   . Hypertension   . Migraine    "frequently when I was younger; maybe monthly now" (10/14/2014)  . Type II diabetes mellitus ([place])     Past Surgical History:  Procedure Laterality Date  . ABDOMINAL HYSTERECTOMY  1988  . APPENDECTOMY  1988  . BILATERAL OOPHORECTOMY  2004  . CARPAL TUNNEL RELEASE Bilateral 1986  . CATARACT EXTRACTION W/ INTRAOCULAR LENS  IMPLANT, BILATERAL Bilateral 2011  . JOINT REPLACEMENT    . SHOULDER ARTHROSCOPY DISTAL CLAVICLE EXCISION AND OPEN ROTATOR CUFF REPAIR Right 11/2013  . TONSILLECTOMY  1950's  . TOTAL KNEE ARTHROPLASTY Left 10/13/2014   Procedure: LEFT TOTAL KNEE ARTHROPLASTY;  Surgeon: Mcarthur Rossetti, MD;  Location: [place];  Service: Orthopedics;  Laterality: Left;    There were no vitals filed for this visit.      Subjective Assessment - 07/13/16 1432    Subjective Pt reports that she was tired after last visit but not sore   Currently in Pain? No/denies   Pain Score 0-No pain                         [facility]  Adult PT Treatment/Exercise - 07/13/16 0001      Ankle Exercises: Aerobic   Tread Mill NuStep Level 4 x 6 minutes     Ankle Exercises: Seated   ABC's 2 reps   Heel Raises 20 reps  on airex   Toe Raise 20 reps  on airex    Other Seated Ankle Exercises foot on sit fit ankle PF/DF, inv/eversion and circles CW and CCW   Other Seated Ankle Exercises Tband ankle 4 wat blue x20     Ankle Exercises: Machines for Strengthening   Cybex Leg Press 20lb 2x15                  PT Short Term Goals - 06/28/16 1435      PT SHORT TERM GOAL #1   Title Independent with initial HEP by 07/11/16   Time 2   Period Weeks   Status New           PT Long Term Goals - 06/28/16 1435      PT LONG TERM GOAL #1   Title Independent with advanced HEP by 09/08/16   Time 8   Period Weeks   Status New     PT LONG TERM GOAL #2   Title R ankle ROM  WNL w/o pain by 09/08/16   Time 8   Period Weeks   Status New     PT LONG TERM GOAL #3   Title R ankle strength >/= 4+/5 by 08/09/16   Time 8   Period Weeks   Status New     PT LONG TERM GOAL #4   Title B hip/knee strength >/= 4+/5 by 08/09/16   Time 8   Period Weeks   Status New     PT LONG TERM GOAL #5   Title Able to walk community distances w/o increased R ankle/heel pain by 08/09/16   Time 8   Period Weeks   Status New               Plan - 07/13/16 1514    Clinical Impression Statement Completed all interventions well, no reports of increase pain during today's session. Pt demos good AROM but still ambulates with an antalgic gait. weakness noted with heel raises. Open wound raman over incision site.   Rehab Potential Good   Clinical Impairments Affecting Rehab Potential R knee OA in need of TKR, s/p L TKR in 2016   PT Frequency 2x / week   PT Duration 8 weeks   PT Treatment/Interventions Patient/family education;Therapeutic exercise;Neuromuscular re-education;Gait training;Manual techniques;Passive range of motion;Electrical  Stimulation;Cryotherapy;Vasopneumatic Device   PT Next Visit Plan week 6 of protocol, try ionto if wound has healed      Patient will benefit from skilled therapeutic intervention in order to improve the following deficits and impairments:  Difficulty walking, Abnormal gait, Decreased range of motion, Impaired flexibility, Decreased strength, Decreased scar mobility, Decreased activity tolerance  Visit Diagnosis: Stiffness of right ankle, not elsewhere classified  Difficulty in walking, not elsewhere classified  Pain in right ankle and joints of right foot  Localized swelling, mass and lump, right lower limb     Problem List Patient Active Problem List   Diagnosis Date Noted  . Arthritis of left knee 10/13/2014  . Status post total left knee replacement 10/13/2014    Scot Jun, PTA  07/13/2016, 3:17 PM  [place] [address] [place], [place], [address] Phone: [phone]   Fax:  [phone]  Name: Raela Weidenbach MRN: [id number] Date of Birth: 11-30-1950

## 2016-07-18 ENCOUNTER — Telehealth: Payer: Self-pay

## 2016-07-18 ENCOUNTER — Ambulatory Visit: Payer: Medicare Other | Admitting: Physical Therapy

## 2016-07-18 ENCOUNTER — Encounter: Payer: Self-pay | Admitting: Physical Therapy

## 2016-07-18 DIAGNOSIS — R262 Difficulty in walking, not elsewhere classified: Secondary | ICD-10-CM

## 2016-07-18 DIAGNOSIS — M25571 Pain in right ankle and joints of right foot: Secondary | ICD-10-CM | POA: Diagnosis not present

## 2016-07-18 DIAGNOSIS — M25562 Pain in left knee: Secondary | ICD-10-CM | POA: Diagnosis not present

## 2016-07-18 DIAGNOSIS — G8929 Other chronic pain: Secondary | ICD-10-CM | POA: Diagnosis not present

## 2016-07-18 DIAGNOSIS — M25671 Stiffness of right ankle, not elsewhere classified: Secondary | ICD-10-CM | POA: Diagnosis not present

## 2016-07-18 DIAGNOSIS — R2241 Localized swelling, mass and lump, right lower limb: Secondary | ICD-10-CM

## 2016-07-18 NOTE — Therapy (Signed)
[address] [place] [address] [place], [place], [address] Phone: [phone]   Fax:  [phone]  Physical Therapy Treatment  Patient Details  Name: Debra Wright MRN: [id number] Date of Birth: 13-Oct-1950 Referring Provider: Kathryne Hitch  Encounter Date: 07/18/2016      PT End of Session - 07/18/16 1552    Visit Number 4   Number of Visits 20   Date for PT Re-Evaluation 08/28/16   PT Start Time 4034   PT Stop Time 1554   PT Time Calculation (min) 41 min   Activity Tolerance Patient tolerated treatment well   Behavior During Therapy [facility] for tasks assessed/performed      Past Medical History:  Diagnosis Date  . Anxiety   . Arthritis    "knees; right thumb" (10/14/2014)  . Basal cell carcinoma    "burned off upper lip & right shoulder"  . Depression   . GERD (gastroesophageal reflux disease)   . High cholesterol   . Hypertension   . Migraine    "frequently when I was younger; maybe monthly now" (10/14/2014)  . Type II diabetes mellitus ([place])     Past Surgical History:  Procedure Laterality Date  . ABDOMINAL HYSTERECTOMY  1988  . APPENDECTOMY  1988  . BILATERAL OOPHORECTOMY  2004  . CARPAL TUNNEL RELEASE Bilateral 1986  . CATARACT EXTRACTION W/ INTRAOCULAR LENS  IMPLANT, BILATERAL Bilateral 2011  . JOINT REPLACEMENT    . SHOULDER ARTHROSCOPY DISTAL CLAVICLE EXCISION AND OPEN ROTATOR CUFF REPAIR Right 11/2013  . TONSILLECTOMY  1950's  . TOTAL KNEE ARTHROPLASTY Left 10/13/2014   Procedure: LEFT TOTAL KNEE ARTHROPLASTY;  Surgeon: Mcarthur Rossetti, MD;  Location: [place];  Service: Orthopedics;  Laterality: Left;    There were no vitals filed for this visit.      Subjective Assessment - 07/18/16 1514    Subjective "It feels better, I don't have any pain, I just walk with a little limp"   Currently in Pain? No/denies   Pain Score 0-No pain            [facility] PT Assessment - 07/18/16 0001      AROM   Right Ankle Dorsiflexion 18   Right Ankle Plantar Flexion 41   Right Ankle Inversion 35   Right Ankle Eversion 20                     [facility] Adult PT Treatment/Exercise - 07/18/16 0001      Ambulation/Gait   Stairs Yes   Stairs Assistance 5: Supervision   Stair Management Technique One rail Left   Number of Stairs 12   Height of Stairs 6   Gait Comments Lands hard on LLE with descents     Ankle Exercises: Aerobic   Tread Mill NuStep Level 4 x 6 minutes     Ankle Exercises: Seated   Heel Raises 20 reps  on airex   Toe Raise 20 reps  on airex    Other Seated Ankle Exercises foot on sit fit ankle PF/DF, inv/eversion and circles CW and CCW   Other Seated Ankle Exercises Tband ankle 4 way blue x20     Ankle Exercises: Standing   Other Standing Ankle Exercises step ups 6 in RLE 2x10; heel raises black bar 2x10     Ankle Exercises: Stretches   Gastroc Stretch 4 reps;10 seconds  PT Short Term Goals - 06/28/16 1435      PT SHORT TERM GOAL #1   Title Independent with initial HEP by 07/11/16   Time 2   Period Weeks   Status New           PT Long Term Goals - 07/18/16 1555      PT LONG TERM GOAL #1   Title Independent with advanced HEP by 09/08/16   Status On-going     PT LONG TERM GOAL #2   Title R ankle ROM WNL w/o pain by 09/08/16   Status Partially Met     PT LONG TERM GOAL #3   Title R ankle strength >/= 4+/5 by 08/09/16   Status On-going     PT LONG TERM GOAL #4   Title B hip/knee strength >/= 4+/5 by 08/09/16   Status On-going     PT LONG TERM GOAL #5   Title Able to walk community distances w/o increased R ankle/heel pain by 08/09/16   Status Partially Met               Plan - 07/18/16 1552    Clinical Impression Statement Pt has progressed increasing her R ankle ROM in all directions. Pt with decrease toe off with ambulation. Attempted stair negotiation one rail alternating pattern, pt tends to land hard on  LLE. She reports that her hard landing is due to her R knee. Pt report no pain but walks with a mild limp.   Rehab Potential Good   Clinical Impairments Affecting Rehab Potential R knee OA in need of TKR, s/p L TKR in 2016   PT Frequency 2x / week   PT Duration 8 weeks   PT Treatment/Interventions Patient/family education;Therapeutic exercise;Neuromuscular re-education;Gait training;Manual techniques;Passive range of motion;Electrical Stimulation;Cryotherapy;Vasopneumatic Device   PT Next Visit Plan week 6 of protocol      Patient will benefit from skilled therapeutic intervention in order to improve the following deficits and impairments:  Difficulty walking, Abnormal gait, Decreased range of motion, Impaired flexibility, Decreased strength, Decreased scar mobility, Decreased activity tolerance  Visit Diagnosis: Stiffness of right ankle, not elsewhere classified  Difficulty in walking, not elsewhere classified  Pain in right ankle and joints of right foot  Localized swelling, mass and lump, right lower limb     Problem List Patient Active Problem List   Diagnosis Date Noted  . Arthritis of left knee 10/13/2014  . Status post total left knee replacement 10/13/2014    Scot Jun, PTA  07/18/2016, 3:56 PM  [place] [address] [place], [place], [address] Phone: [phone]   Fax:  [phone]  Name: Jazyiah Yiu MRN: [id number] Date of Birth: [birth year]

## 2016-07-18 NOTE — Telephone Encounter (Signed)
08/17/16 patient cxl PT appt, husband fell - ambulance called

## 2016-07-19 ENCOUNTER — Ambulatory Visit: Payer: Medicare Other | Admitting: Physical Therapy

## 2016-07-24 ENCOUNTER — Encounter: Payer: Self-pay | Admitting: Physical Therapy

## 2016-07-24 ENCOUNTER — Ambulatory Visit: Payer: Medicare Other | Admitting: Physical Therapy

## 2016-07-24 DIAGNOSIS — R2241 Localized swelling, mass and lump, right lower limb: Secondary | ICD-10-CM | POA: Diagnosis not present

## 2016-07-24 DIAGNOSIS — G8929 Other chronic pain: Secondary | ICD-10-CM | POA: Diagnosis not present

## 2016-07-24 DIAGNOSIS — M25571 Pain in right ankle and joints of right foot: Secondary | ICD-10-CM | POA: Diagnosis not present

## 2016-07-24 DIAGNOSIS — R262 Difficulty in walking, not elsewhere classified: Secondary | ICD-10-CM | POA: Diagnosis not present

## 2016-07-24 DIAGNOSIS — M25671 Stiffness of right ankle, not elsewhere classified: Secondary | ICD-10-CM | POA: Diagnosis not present

## 2016-07-24 DIAGNOSIS — M25562 Pain in left knee: Secondary | ICD-10-CM | POA: Diagnosis not present

## 2016-07-24 NOTE — Therapy (Signed)
[place] [address] [place], [place], [address] Phone: [phone]   Fax:  [phone]  Physical Therapy Treatment  Patient Details  Name: Debra Wright MRN: [id number] Date of Birth: November 10, 1950 Referring Provider: Kathryne Hitch  Encounter Date: 07/24/2016      PT End of Session - 07/24/16 1554    Visit Number 5   Number of Visits 20   Date for PT Re-Evaluation 08/28/16   Authorization Type Medicare   PT Start Time 0349   PT Stop Time 1559   PT Time Calculation (min) 44 min   Activity Tolerance Patient tolerated treatment well   Behavior During Therapy [facility] for tasks assessed/performed      Past Medical History:  Diagnosis Date  . Anxiety   . Arthritis    "knees; right thumb" (10/14/2014)  . Basal cell carcinoma    "burned off upper lip & right shoulder"  . Depression   . GERD (gastroesophageal reflux disease)   . High cholesterol   . Hypertension   . Migraine    "frequently when I was younger; maybe monthly now" (10/14/2014)  . Type II diabetes mellitus ([place])     Past Surgical History:  Procedure Laterality Date  . ABDOMINAL HYSTERECTOMY  1988  . APPENDECTOMY  1988  . BILATERAL OOPHORECTOMY  2004  . CARPAL TUNNEL RELEASE Bilateral 1986  . CATARACT EXTRACTION W/ INTRAOCULAR LENS  IMPLANT, BILATERAL Bilateral 2011  . JOINT REPLACEMENT    . SHOULDER ARTHROSCOPY DISTAL CLAVICLE EXCISION AND OPEN ROTATOR CUFF REPAIR Right 11/2013  . TONSILLECTOMY  1950's  . TOTAL KNEE ARTHROPLASTY Left 10/13/2014   Procedure: LEFT TOTAL KNEE ARTHROPLASTY;  Surgeon: Mcarthur Rossetti, MD;  Location: [place];  Service: Orthopedics;  Laterality: Left;    There were no vitals filed for this visit.      Subjective Assessment - 07/24/16 1520    Subjective "A lot better, that spot isn't as tender, I still have my lump"   Currently in Pain? Yes   Pain Score 4    Pain Location Knee   Pain Orientation Right                          [facility] Adult PT Treatment/Exercise - 07/24/16 0001      Ambulation/Gait   Stairs Yes   Stairs Assistance 5: Supervision   Stair Management Technique One rail Left   Number of Stairs 24   Gait Comments Lands hard on LLE with descents, she reports that her ankle feels fine but her R knee hurts     Ankle Exercises: Aerobic   Stationary Bike L0 x4 min    Tread Mill NuStep Level 4 x 7 minutes     Ankle Exercises: Seated   ABC's 2 reps   Toe Raise 20 reps  on airex   Other Seated Ankle Exercises Tband ankle 4 wat blue x20     Ankle Exercises: Standing   Other Standing Ankle Exercises heel raises black bar 2x15   Other Standing Ankle Exercises Heel walking 54f x2     Ankle Exercises: Stretches   Gastroc Stretch 30 seconds;3 reps                  PT Short Term Goals - 06/28/16 1435      PT SHORT TERM GOAL #1   Title Independent with initial HEP by 07/11/16   Time 2   Period  Weeks   Status New           PT Long Term Goals - 07/24/16 1557      PT LONG TERM GOAL #1   Title Independent with advanced HEP by 09/08/16   Status On-going     PT LONG TERM GOAL #2   Title R ankle ROM WNL w/o pain by 09/08/16   Status Partially Met     PT LONG TERM GOAL #3   Title R ankle strength >/= 4+/5 by 08/09/16   Status On-going     PT LONG TERM GOAL #4   Title B hip/knee strength >/= 4+/5 by 08/09/16   Status On-going     PT LONG TERM GOAL #5   Title Able to walk community distances w/o increased R ankle/heel pain by 08/09/16   Status Partially Met               Plan - 07/24/16 1555    Clinical Impression Statement Pt R ankle ROM remains well but continues to be weak. Again weak toe off with RLE. Pt with some difficulty with stair negotiation but she reports it is due to her R knee.    Rehab Potential Good   Clinical Impairments Affecting Rehab Potential R knee OA in need of TKR, s/p L TKR in 2016   PT Frequency 2x / week    PT Duration 8 weeks   PT Treatment/Interventions Patient/family education;Therapeutic exercise;Neuromuscular re-education;Gait training;Manual techniques;Passive range of motion;Electrical Stimulation;Cryotherapy;Vasopneumatic Device   PT Next Visit Plan week 8-10 of protocol      Patient will benefit from skilled therapeutic intervention in order to improve the following deficits and impairments:  Difficulty walking, Abnormal gait, Decreased range of motion, Impaired flexibility, Decreased strength, Decreased scar mobility, Decreased activity tolerance  Visit Diagnosis: Stiffness of right ankle, not elsewhere classified  Difficulty in walking, not elsewhere classified  Pain in right ankle and joints of right foot  Localized swelling, mass and lump, right lower limb  Chronic pain of left knee     Problem List Patient Active Problem List   Diagnosis Date Noted  . Arthritis of left knee 10/13/2014  . Status post total left knee replacement 10/13/2014    Scot Jun, PTA  07/24/2016, 3:57 PM  [place] [address] [place], [place], [address] Phone: [phone]   Fax:  [phone]  Name: Debra Wright MRN: [id number] Date of Birth: [birth year]

## 2016-07-25 DIAGNOSIS — M25551 Pain in right hip: Secondary | ICD-10-CM | POA: Diagnosis not present

## 2016-07-25 DIAGNOSIS — M1711 Unilateral primary osteoarthritis, right knee: Secondary | ICD-10-CM | POA: Diagnosis not present

## 2016-07-27 ENCOUNTER — Encounter: Payer: Self-pay | Admitting: Physical Therapy

## 2016-07-27 ENCOUNTER — Ambulatory Visit: Payer: Medicare Other | Admitting: Physical Therapy

## 2016-07-27 DIAGNOSIS — R262 Difficulty in walking, not elsewhere classified: Secondary | ICD-10-CM

## 2016-07-27 DIAGNOSIS — G8929 Other chronic pain: Secondary | ICD-10-CM

## 2016-07-27 DIAGNOSIS — M25562 Pain in left knee: Secondary | ICD-10-CM

## 2016-07-27 DIAGNOSIS — R2241 Localized swelling, mass and lump, right lower limb: Secondary | ICD-10-CM

## 2016-07-27 DIAGNOSIS — M25571 Pain in right ankle and joints of right foot: Secondary | ICD-10-CM

## 2016-07-27 DIAGNOSIS — M25671 Stiffness of right ankle, not elsewhere classified: Secondary | ICD-10-CM

## 2016-07-27 NOTE — Therapy (Signed)
[address] [place], [place], [address] Phone: [phone]   Fax:  [phone]  Physical Therapy Treatment  Patient Details  Name: Debra Wright MRN: [id number] Date of Birth: 21-Apr-1951 Referring Provider: Kathryne Hitch  Encounter Date: 07/27/2016      PT End of Session - 07/27/16 1600    Visit Number 6   Number of Visits 20   Date for PT Re-Evaluation 08/28/16   PT Start Time 1093   PT Stop Time 1600   PT Time Calculation (min) 45 min   Activity Tolerance Patient tolerated treatment well   Behavior During Therapy [facility] for tasks assessed/performed      Past Medical History:  Diagnosis Date  . Anxiety   . Arthritis    "knees; right thumb" (10/14/2014)  . Basal cell carcinoma    "burned off upper lip & right shoulder"  . Depression   . GERD (gastroesophageal reflux disease)   . High cholesterol   . Hypertension   . Migraine    "frequently when I was younger; maybe monthly now" (10/14/2014)  . Type II diabetes mellitus ([place])     Past Surgical History:  Procedure Laterality Date  . ABDOMINAL HYSTERECTOMY  1988  . APPENDECTOMY  1988  . BILATERAL OOPHORECTOMY  2004  . CARPAL TUNNEL RELEASE Bilateral 1986  . CATARACT EXTRACTION W/ INTRAOCULAR LENS  IMPLANT, BILATERAL Bilateral 2011  . JOINT REPLACEMENT    . SHOULDER ARTHROSCOPY DISTAL CLAVICLE EXCISION AND OPEN ROTATOR CUFF REPAIR Right 11/2013  . TONSILLECTOMY  1950's  . TOTAL KNEE ARTHROPLASTY Left 10/13/2014   Procedure: LEFT TOTAL KNEE ARTHROPLASTY;  Surgeon: Mcarthur Rossetti, MD;  Location: [place];  Service: Orthopedics;  Laterality: Left;    There were no vitals filed for this visit.      Subjective Assessment - 07/27/16 1517    Subjective Pt reports that she went to the mb yesterday, and he gave her an injection in her R knee due to pain.    Currently in Pain? No/denies   Pain Score 0-No pain            [facility] PT Assessment -  07/27/16 0001      AROM   Right Ankle Dorsiflexion 12   Right Ankle Plantar Flexion 41   Right Ankle Inversion 29   Right Ankle Eversion 20                     [facility] Adult PT Treatment/Exercise - 07/27/16 0001      Manual Therapy   Manual Therapy Passive ROM   Manual therapy comments some PROM taken to end range and held   Passive ROM R ankle all directions     Ankle Exercises: Aerobic   Stationary Bike L0 x4 min    Tread Mill NuStep Level 4 x 7 minutes     Ankle Exercises: Machines for Strengthening   Cybex Leg Press 20lb 2x15; heel raises 20lb 2x15      Ankle Exercises: Stretches   Gastroc Stretch 30 seconds;3 reps     Ankle Exercises: Standing   Other Standing Ankle Exercises heel raises airex pad 2x15   Other Standing Ankle Exercises Heel walking 58f x2     Ankle Exercises: Seated   Other Seated Ankle Exercises Tband ankle 4 way blue x20; step downs with LLE 2x10  PT Short Term Goals - 06/28/16 1435      PT SHORT TERM GOAL #1   Title Independent with initial HEP by 07/11/16   Time 2   Period Weeks   Status New           PT Long Term Goals - 07/24/16 1557      PT LONG TERM GOAL #1   Title Independent with advanced HEP by 09/08/16   Status On-going     PT LONG TERM GOAL #2   Title R ankle ROM WNL w/o pain by 09/08/16   Status Partially Met     PT LONG TERM GOAL #3   Title R ankle strength >/= 4+/5 by 08/09/16   Status On-going     PT LONG TERM GOAL #4   Title B hip/knee strength >/= 4+/5 by 08/09/16   Status On-going     PT LONG TERM GOAL #5   Title Able to walk community distances w/o increased R ankle/heel pain by 08/09/16   Status Partially Met               Plan - 07/27/16 1600    Clinical Impression Statement Pt ambulates with a mild limp, LE buckles slightly with to off. Pt thinks it due to a bad knee. Good ROM in R ankle remain, decrease strength with PF.   Rehab Potential Good   Clinical  Impairments Affecting Rehab Potential R knee OA in need of TKR, s/p L TKR in 2016   PT Frequency 2x / week   PT Duration 8 weeks   PT Treatment/Interventions Patient/family education;Therapeutic exercise;Neuromuscular re-education;Gait training;Manual techniques;Passive range of motion;Electrical Stimulation;Cryotherapy;Vasopneumatic Device   PT Next Visit Plan week 8-10 of protocal      Patient will benefit from skilled therapeutic intervention in order to improve the following deficits and impairments:  Difficulty walking, Abnormal gait, Decreased range of motion, Impaired flexibility, Decreased strength, Decreased scar mobility, Decreased activity tolerance  Visit Diagnosis: Stiffness of right ankle, not elsewhere classified  Difficulty in walking, not elsewhere classified  Localized swelling, mass and lump, right lower limb  Pain in right ankle and joints of right foot  Chronic pain of left knee     Problem List Patient Active Problem List   Diagnosis Date Noted  . Arthritis of left knee 10/13/2014  . Status post total left knee replacement 10/13/2014    Scot Jun , PTA 07/27/2016, 4:06 PM  [facility] [place], [place], [address] Phone: [phone]   Fax:  [phone]  Name: Debra Wright MRN: [id number] Date of Birth: 1950/12/30

## 2016-07-31 ENCOUNTER — Ambulatory Visit: Payer: Medicare Other | Admitting: Physical Therapy

## 2016-08-01 ENCOUNTER — Ambulatory Visit: Payer: Medicare Other | Admitting: Adult Health

## 2016-08-01 ENCOUNTER — Encounter: Payer: Self-pay | Admitting: Adult Health

## 2016-08-01 ENCOUNTER — Ambulatory Visit (INDEPENDENT_AMBULATORY_CARE_PROVIDER_SITE_OTHER): Payer: Medicare Other | Admitting: Adult Health

## 2016-08-01 VITALS — BP 134/70 | Temp 98.6°F | Wt 206.0 lb

## 2016-08-01 DIAGNOSIS — G4700 Insomnia, unspecified: Secondary | ICD-10-CM

## 2016-08-01 DIAGNOSIS — Z7689 Persons encountering health services in other specified circumstances: Secondary | ICD-10-CM

## 2016-08-01 DIAGNOSIS — Z760 Encounter for issue of repeat prescription: Secondary | ICD-10-CM

## 2016-08-01 DIAGNOSIS — F418 Other specified anxiety disorders: Secondary | ICD-10-CM | POA: Diagnosis not present

## 2016-08-01 DIAGNOSIS — E118 Type 2 diabetes mellitus with unspecified complications: Secondary | ICD-10-CM

## 2016-08-01 DIAGNOSIS — I10 Essential (primary) hypertension: Secondary | ICD-10-CM

## 2016-08-01 DIAGNOSIS — F419 Anxiety disorder, unspecified: Secondary | ICD-10-CM

## 2016-08-01 DIAGNOSIS — F329 Major depressive disorder, single episode, unspecified: Secondary | ICD-10-CM

## 2016-08-01 LAB — POCT GLYCOSYLATED HEMOGLOBIN (HGB A1C): Hemoglobin A1C: 6.8

## 2016-08-01 MED ORDER — OMEPRAZOLE 40 MG PO CPDR: 40.0000 mg | DELAYED_RELEASE_CAPSULE | Freq: Every morning | ORAL | 3 refills | Status: DC

## 2016-08-01 MED ORDER — METFORMIN HCL 1000 MG PO TABS: 1000.0000 mg | ORAL_TABLET | Freq: Two times a day (BID) | ORAL | 2 refills | Status: DC

## 2016-08-01 MED ORDER — ESZOPICLONE 2 MG PO TABS: 2.0000 mg | ORAL_TABLET | Freq: Every evening | ORAL | 0 refills | Status: DC | PRN

## 2016-08-01 MED ORDER — PRAVASTATIN SODIUM 20 MG PO TABS: 20.0000 mg | ORAL_TABLET | Freq: Every day | ORAL | 3 refills | Status: DC

## 2016-08-01 MED ORDER — CITALOPRAM HYDROBROMIDE 40 MG PO TABS: 40.0000 mg | ORAL_TABLET | Freq: Every morning | ORAL | 3 refills | Status: DC

## 2016-08-01 MED ORDER — AMLODIPINE BESYLATE 10 MG PO TABS: 10.0000 mg | ORAL_TABLET | Freq: Every day | ORAL | 3 refills | Status: DC

## 2016-08-01 NOTE — Patient Instructions (Addendum)
It was great meeting you today  Your A1c is 6.8  Please work on diet and exercise. I would like to see you back in three months for a diabetic check.    Please use a probiotic for your diarrhea and see if that works. If you do not notice any change in the next month, please let me know.   Follow up in March for your next physical.

## 2016-08-01 NOTE — Progress Notes (Signed)
Patient presents to clinic today to establish care. She is a pleasant 65 year old female who  has a past medical history of Anxiety; Arthritis; Basal cell carcinoma; Depression; Frequent diarrhea; GERD (gastroesophageal reflux disease); High cholesterol; Hypertension; Insomnia; Migraine; and Type II diabetes mellitus ([place]).  Her last physical was in March 2017.   Acute Concerns: Establish Care  Chronic Issues: Diarrhea  - She has chronic diarrhea 3-4 times per week for the last 1-2 months. She reports it started after abx therapy for suspected spider bite. She denies any blood in her stool. No abdominal pain. Her last colonoscopy was in 2016 and was normal.   DM II - She takes Metfromin 1000mg  BID. Reports that her blood sugars are usually in the in the 130's. A1c ranges from 6.8 - 7.2. She eats healthy but does not exercise. She denies any recent highs or lows.   Hypertension  - Takes Amlodipine 10 mg and feels as though blood pressure is well controlled on that. Denies any headaches, blurred vision, Lightheadedness or syncopal episodes  Health Maintenance: Dental --Routine  Vision --Routine  Immunizations -- UTD  Colonoscopy -- 2016 - 10 year plan  Mammogram -- 09/2015 PAP -- No longer needs Diet: Tries to follow a diabetic diet Exercise: Does not exercise. Reports that exercise is hard for her due to all the orthopedic surgeries she has had.   Is followed by - Orthopedics    Past Medical History:  Diagnosis Date  . Anxiety   . Arthritis    "knees; right thumb" (10/14/2014)  . Basal cell carcinoma    "burned off upper lip & right shoulder"  . Depression   . Frequent diarrhea   . GERD (gastroesophageal reflux disease)   . High cholesterol   . Hypertension   . Insomnia   . Migraine    "frequently when I was younger; maybe monthly now" (10/14/2014)  . Type II diabetes mellitus ([place])     Past Surgical History:  Procedure Laterality Date  . ABDOMINAL HYSTERECTOMY   1988  . APPENDECTOMY  1988  . BILATERAL OOPHORECTOMY  2004  . CARPAL TUNNEL RELEASE Bilateral 1986  . CATARACT EXTRACTION W/ INTRAOCULAR LENS  IMPLANT, BILATERAL Bilateral 2011  . JOINT REPLACEMENT    . SHOULDER ARTHROSCOPY DISTAL CLAVICLE EXCISION AND OPEN ROTATOR CUFF REPAIR Right 11/2013  . TONSILLECTOMY  1950's  . TOTAL KNEE ARTHROPLASTY Left 10/13/2014   Procedure: LEFT TOTAL KNEE ARTHROPLASTY;  Surgeon: Mcarthur Rossetti, MD;  Location: [facility];  Service: Orthopedics;  Laterality: Left;    Current Outpatient Prescriptions on File Prior to Visit  Medication Sig Dispense Refill  . amLODipine (NORVASC) 10 MG tablet Take 10 mg by mouth daily.    . Biotin 5000 MCG TABS Take 5,000 mcg by mouth daily.    . calcium carbonate (TUMS - DOSED IN MG ELEMENTAL CALCIUM) 500 MG chewable tablet Chew 2 tablets by mouth daily as needed for indigestion or heartburn.    . Calcium Carbonate-Vitamin D (CALCIUM 600 + D PO) Take 1 tablet by mouth daily.    . citalopram (CELEXA) 40 MG tablet Take 40 mg by mouth every morning.    . diazepam (VALIUM) 5 MG tablet Take 1 tablet (5 mg total) by mouth every 6 (six) hours as needed (dizziness). 10 tablet 0  . eszopiclone (LUNESTA) 2 MG TABS tablet Take 2 mg by mouth at bedtime as needed for sleep. Take immediately before bedtime    . ibuprofen (ADVIL,MOTRIN)  200 MG tablet Take 400 mg by mouth every 6 (six) hours as needed for moderate pain.    . metFORMIN (GLUCOPHAGE) 1000 MG tablet Take 1,000 mg by mouth 2 (two) times daily with a meal.     . omega-3 acid ethyl esters (LOVAZA) 1 G capsule Take 2 g by mouth 2 (two) times daily.    Marland Kitchen omeprazole (PRILOSEC) 40 MG capsule Take 40 mg by mouth every morning.    Marland Kitchen oxyCODONE-acetaminophen (ROXICET) 5-325 MG per tablet Take 1-2 tablets by mouth every 4 (four) hours as needed for severe pain. 60 tablet 0  . pravastatin (PRAVACHOL) 20 MG tablet Take 20 mg by mouth at bedtime.     No current facility-administered medications  on file prior to visit.     Allergies  Allergen Reactions  . Antivert [Meclizine Hcl] Other (See Comments)    "makes me feel like my skin is falling off".    Family History  Problem Relation Age of Onset  . Arthritis Mother   . Hyperlipidemia Mother   . Diabetes Mother   . Arthritis Father   . Hyperlipidemia Father   . Heart disease Father   . Diabetes Father   . Stroke Brother   . Hypertension Brother   . Arthritis Maternal Grandmother   . Hyperlipidemia Maternal Grandmother   . Diabetes Maternal Grandmother   . Hypertension Maternal Grandmother   . Arthritis Maternal Grandfather   . Hyperlipidemia Maternal Grandfather   . Hypertension Maternal Grandfather   . Arthritis Paternal Grandmother   . Hyperlipidemia Paternal Grandmother   . Diabetes Paternal Grandmother   . Hypertension Paternal Grandmother   . Arthritis Paternal Grandfather   . Hyperlipidemia Paternal Grandfather   . Diabetes Paternal Grandfather   . Hypertension Paternal Grandfather     Social History   Social History  . Marital status: Married    Spouse name: N/A  . Number of children: N/A  . Years of education: N/A   Occupational History  . Not on file.   Social History Main Topics  . Smoking status: Never Smoker  . Smokeless tobacco: Never Used  . Alcohol use Yes     Comment: 10/14/2014 "might have a drink when I'm out once/month"  . Drug use: No  . Sexual activity: Not Currently   Other Topics Concern  . Not on file   Social History Narrative  . No narrative on file    Review of Systems  Constitutional: Negative.   HENT: Negative.   Eyes: Negative.   Respiratory: Negative.   Cardiovascular: Negative.   Gastrointestinal: Positive for diarrhea and heartburn. Negative for abdominal pain, blood in stool, constipation, nausea and vomiting.  Genitourinary: Negative.   Musculoskeletal: Positive for back pain and joint pain. Negative for falls, myalgias and neck pain.  Skin: Negative.     Neurological: Negative.   Endo/Heme/Allergies: Negative.   Psychiatric/Behavioral: Positive for depression. The patient is nervous/anxious and has insomnia.   All other systems reviewed and are negative.   BP 134/70   Temp 98.6 F (37 C) (Oral)   Wt 206 lb (93.4 kg)   BMI 36.49 kg/m   Physical Exam  Constitutional: She is oriented to person, place, and time and well-developed, well-nourished, and in no distress. No distress.  HENT:  Head: Normocephalic and atraumatic.  Right Ear: External ear normal.  Left Ear: External ear normal.  Nose: Nose normal.  Mouth/Throat: Oropharynx is clear and moist. No oropharyngeal exudate.  Eyes: Conjunctivae and EOM  are normal. Pupils are equal, round, and reactive to light. Right eye exhibits no discharge. Left eye exhibits no discharge. No scleral icterus.  Neck: Normal range of motion. Neck supple. No JVD present. No tracheal deviation present. No thyromegaly present.  Cardiovascular: Normal rate, regular rhythm, normal heart sounds and intact distal pulses.  Exam reveals no gallop and no friction rub.   No murmur heard. Pulmonary/Chest: Effort normal and breath sounds normal. No stridor. No respiratory distress. She has no wheezes. She has no rales. She exhibits no tenderness.  Abdominal: Soft. Bowel sounds are normal. She exhibits no distension and no mass. There is no tenderness. There is no rebound and no guarding.  Musculoskeletal: She exhibits tenderness (chronic orthopedic pain throughout body ). She exhibits no edema or deformity.  Lymphadenopathy:    She has no cervical adenopathy.  Neurological: She is alert and oriented to person, place, and time. GCS score is 15.  Skin: Skin is warm and dry. No rash noted. She is not diaphoretic. No erythema. No pallor.  Psychiatric: Mood, memory, affect and judgment normal.  Nursing note and vitals reviewed.  Assessment/Plan: 1. Encounter to establish care - Follow up in March for CPE or sooner  with any acute issues - Educated on lifestyle modifications to help lose weight    2. Controlled type 2 diabetes mellitus with complication, without long-term current use of insulin (HCC)  - POC HgB A1c- 6.8  - metFORMIN (GLUCOPHAGE) 1000 MG tablet; Take 1 tablet (1,000 mg total) by mouth 2 (two) times daily with a meal.  Dispense: 180 tablet; Refill: 2 - No change - Consider adding an additional agent in the future  3. Essential hypertension - Near goal at this visit.  - Will continue to monitor - advised to work on weight reduction with light exercises and eating healthy  BP Readings from Last 3 Encounters:  08/01/16 134/70  10/17/14 (!) 121/54  10/16/14 (!) 143/55     4. Anxiety and depression - Controlled. No change in medication  - Advised to seek psychiatry if needed - citalopram (CELEXA) 40 MG tablet; Take 1 tablet (40 mg total) by mouth every morning.  Dispense: 90 tablet; Refill: 3  5. Medication refill  - pravastatin (PRAVACHOL) 20 MG tablet; Take 1 tablet (20 mg total) by mouth at bedtime.  Dispense: 90 tablet; Refill: 3 - omeprazole (PRILOSEC) 40 MG capsule; Take 1 capsule (40 mg total) by mouth every morning.  Dispense: 90 capsule; Refill: 3 - metFORMIN (GLUCOPHAGE) 1000 MG tablet; Take 1 tablet (1,000 mg total) by mouth 2 (two) times daily with a meal.  Dispense: 180 tablet; Refill: 2 - citalopram (CELEXA) 40 MG tablet; Take 1 tablet (40 mg total) by mouth every morning.  Dispense: 90 tablet; Refill: 3 - amLODipine (NORVASC) 10 MG tablet; Take 1 tablet (10 mg total) by mouth daily.  Dispense: 90 tablet; Refill: 3  6. Insomnia, unspecified type  - eszopiclone (LUNESTA) 2 MG TABS tablet; Take 1 tablet (2 mg total) by mouth at bedtime as needed for sleep. Take immediately before bedtime  Dispense: 30 tablet; Refill: 0   Dorothyann Peng, NP

## 2016-08-02 DIAGNOSIS — G4700 Insomnia, unspecified: Secondary | ICD-10-CM | POA: Insufficient documentation

## 2016-08-02 DIAGNOSIS — F419 Anxiety disorder, unspecified: Secondary | ICD-10-CM

## 2016-08-02 DIAGNOSIS — F32A Depression, unspecified: Secondary | ICD-10-CM | POA: Insufficient documentation

## 2016-08-02 DIAGNOSIS — I10 Essential (primary) hypertension: Secondary | ICD-10-CM | POA: Insufficient documentation

## 2016-08-02 DIAGNOSIS — F329 Major depressive disorder, single episode, unspecified: Secondary | ICD-10-CM | POA: Insufficient documentation

## 2016-08-03 ENCOUNTER — Ambulatory Visit: Payer: Medicare Other | Attending: Orthopedic Surgery | Admitting: Physical Therapy

## 2016-08-03 ENCOUNTER — Encounter: Payer: Self-pay | Admitting: Physical Therapy

## 2016-08-03 DIAGNOSIS — R262 Difficulty in walking, not elsewhere classified: Secondary | ICD-10-CM | POA: Diagnosis not present

## 2016-08-03 DIAGNOSIS — R2241 Localized swelling, mass and lump, right lower limb: Secondary | ICD-10-CM

## 2016-08-03 DIAGNOSIS — M25562 Pain in left knee: Secondary | ICD-10-CM | POA: Insufficient documentation

## 2016-08-03 DIAGNOSIS — M25671 Stiffness of right ankle, not elsewhere classified: Secondary | ICD-10-CM | POA: Diagnosis not present

## 2016-08-03 DIAGNOSIS — M25571 Pain in right ankle and joints of right foot: Secondary | ICD-10-CM | POA: Diagnosis not present

## 2016-08-03 DIAGNOSIS — G8929 Other chronic pain: Secondary | ICD-10-CM | POA: Diagnosis not present

## 2016-08-03 NOTE — Therapy (Signed)
[facility] [place], [place], [address] Phone: [phone]   Fax:  [phone]  Physical Therapy Treatment  Patient Details  Name: Debra Wright MRN: [id number] Date of Birth: [birth year] Referring Provider: Kathryne Hitch  Encounter Date: 08/03/2016      PT End of Session - 08/03/16 1341    Visit Number 7   Number of Visits 20   Date for PT Re-Evaluation 08/28/16   PT Start Time 1300   PT Stop Time 1342   PT Time Calculation (min) 42 min   Activity Tolerance Patient tolerated treatment well   Behavior During Therapy [facility] for tasks assessed/performed      Past Medical History:  Diagnosis Date  . Anxiety   . Arthritis    "knees; right thumb" (10/14/2014)  . Basal cell carcinoma    "burned off upper lip & right shoulder"  . Depression   . Fibroid tumor   . Frequent diarrhea   . GERD (gastroesophageal reflux disease)   . High cholesterol   . Hypertension   . Insomnia   . Migraine    "frequently when I was younger; maybe monthly now" (10/14/2014)  . Type II diabetes mellitus ([place])     Past Surgical History:  Procedure Laterality Date  . ABDOMINAL HYSTERECTOMY  1988  . ACHILLES TENDON SURGERY Right   . APPENDECTOMY  1988  . BILATERAL OOPHORECTOMY  2004  . CARPAL TUNNEL RELEASE Bilateral 1986  . CATARACT EXTRACTION W/ INTRAOCULAR LENS  IMPLANT, BILATERAL Bilateral 2011  . JOINT REPLACEMENT    . SHOULDER ARTHROSCOPY DISTAL CLAVICLE EXCISION AND OPEN ROTATOR CUFF REPAIR Right 11/2013  . TONSILLECTOMY  1950's  . TOTAL KNEE ARTHROPLASTY Left 10/13/2014   Procedure: LEFT TOTAL KNEE ARTHROPLASTY;  Surgeon: Mcarthur Rossetti, MD;  Location: [facility];  Service: Orthopedics;  Laterality: Left;    There were no vitals filed for this visit.      Subjective Assessment - 08/03/16 1300    Subjective "Pt reports that things are going well" Pt also reports that her husband will not be here Monday because her  husband is having surgery   Currently in Pain? No/denies   Pain Score 0-No pain                         [facility] Adult PT Treatment/Exercise - 08/03/16 0001      Ankle Exercises: Aerobic   Stationary Bike L1 x6 min     Ankle Exercises: Stretches   Gastroc Stretch 30 seconds;3 reps     Ankle Exercises: Standing   Other Standing Ankle Exercises heel raises airex pad 2x15; Standing [place]on airex 2x10; Sport cord walking 4 way x3 40lb   Other Standing Ankle Exercises Heel walking 92f x2; forward leans RLE on 8 in box 2x10     Ankle Exercises: Machines for Strengthening   Cybex Leg Press 20lb 2x15; heel raises 20lb 2x15      Ankle Exercises: Seated   Other Seated Ankle Exercises Tband ankle 4 way blue x20;                  PT Short Term Goals - 06/28/16 1435      PT SHORT TERM GOAL #1   Title Independent with initial HEP by 07/11/16   Time 2   Period Weeks   Status New           PT Long Term  Goals - 07/24/16 1557      PT LONG TERM GOAL #1   Title Independent with advanced HEP by 09/08/16   Status On-going     PT LONG TERM GOAL #2   Title R ankle ROM WNL w/o pain by 09/08/16   Status Partially Met     PT LONG TERM GOAL #3   Title R ankle strength >/= 4+/5 by 08/09/16   Status On-going     PT LONG TERM GOAL #4   Title B hip/knee strength >/= 4+/5 by 08/09/16   Status On-going     PT LONG TERM GOAL #5   Title Able to walk community distances w/o increased R ankle/heel pain by 08/09/16   Status Partially Met               Plan - 08/03/16 1341    Clinical Impression Statement Pt has good strength and ROM against tband resistance. Pt R ankle weak with PF when weight bearing. Pt continues to ambulate with a limp but she reports that is due to her knee.   Rehab Potential Good   PT Frequency 2x / week   PT Duration 8 weeks   PT Treatment/Interventions Patient/family education;Therapeutic exercise;Neuromuscular re-education;Gait  training;Manual techniques;Passive range of motion;Electrical Stimulation;Cryotherapy;Vasopneumatic Device   PT Next Visit Plan week 8-10 of protocal      Patient will benefit from skilled therapeutic intervention in order to improve the following deficits and impairments:  Difficulty walking, Abnormal gait, Decreased range of motion, Impaired flexibility, Decreased strength, Decreased scar mobility, Decreased activity tolerance  Visit Diagnosis: Stiffness of right ankle, not elsewhere classified  Difficulty in walking, not elsewhere classified  Localized swelling, mass and lump, right lower limb  Pain in right ankle and joints of right foot     Problem List Patient Active Problem List   Diagnosis Date Noted  . Essential hypertension 08/02/2016  . Anxiety and depression 08/02/2016  . Insomnia 08/02/2016  . Arthritis of left knee 10/13/2014  . Status post total left knee replacement 10/13/2014    Scot Jun 08/03/2016, 1:43 PM  [address] [place], [place], [address] Phone: [phone]   Fax:  [phone]  Name: Debra Wright MRN: [id number] Date of Birth: March 05, 1951

## 2016-08-07 ENCOUNTER — Ambulatory Visit: Payer: Medicare Other | Admitting: Physical Therapy

## 2016-08-10 ENCOUNTER — Encounter: Payer: Self-pay | Admitting: Adult Health

## 2016-08-10 ENCOUNTER — Ambulatory Visit: Payer: Medicare Other | Admitting: Physical Therapy

## 2016-08-10 DIAGNOSIS — R262 Difficulty in walking, not elsewhere classified: Secondary | ICD-10-CM | POA: Diagnosis not present

## 2016-08-10 DIAGNOSIS — M25562 Pain in left knee: Secondary | ICD-10-CM

## 2016-08-10 DIAGNOSIS — R2241 Localized swelling, mass and lump, right lower limb: Secondary | ICD-10-CM | POA: Diagnosis not present

## 2016-08-10 DIAGNOSIS — M25671 Stiffness of right ankle, not elsewhere classified: Secondary | ICD-10-CM | POA: Diagnosis not present

## 2016-08-10 DIAGNOSIS — G8929 Other chronic pain: Secondary | ICD-10-CM | POA: Diagnosis not present

## 2016-08-10 DIAGNOSIS — M25571 Pain in right ankle and joints of right foot: Secondary | ICD-10-CM | POA: Diagnosis not present

## 2016-08-10 NOTE — Therapy (Signed)
[address] [place], [place], [address] Phone: [phone]   Fax:  [phone]  Physical Therapy Treatment  Patient Details  Name: Debra Wright MRN: [id number] Date of Birth: 1951/06/11 Referring Provider: Kathryne Hitch  Encounter Date: 08/10/2016      PT End of Session - 08/10/16 1343    Visit Number 8   Number of Visits 20   Date for PT Re-Evaluation 08/28/16   PT Start Time 0086   PT Stop Time 1344   PT Time Calculation (min) 41 min   Activity Tolerance Patient tolerated treatment well   Behavior During Therapy [facility] for tasks assessed/performed      Past Medical History:  Diagnosis Date  . Anxiety   . Arthritis    "knees; right thumb" (10/14/2014)  . Basal cell carcinoma    "burned off upper lip & right shoulder"  . Depression   . Fibroid tumor   . Frequent diarrhea   . GERD (gastroesophageal reflux disease)   . High cholesterol   . Hypertension   . Insomnia   . Migraine    "frequently when I was younger; maybe monthly now" (10/14/2014)  . Pernicious anemia   . Type II diabetes mellitus ([place])   . Vitamin B 12 deficiency     Past Surgical History:  Procedure Laterality Date  . ABDOMINAL HYSTERECTOMY  1988  . ACHILLES TENDON SURGERY Right   . APPENDECTOMY  1988  . BILATERAL OOPHORECTOMY  2004  . CARPAL TUNNEL RELEASE Bilateral 1986  . CATARACT EXTRACTION W/ INTRAOCULAR LENS  IMPLANT, BILATERAL Bilateral 2011  . JOINT REPLACEMENT    . SHOULDER ARTHROSCOPY DISTAL CLAVICLE EXCISION AND OPEN ROTATOR CUFF REPAIR Right 11/2013  . TONSILLECTOMY  1950's  . TOTAL KNEE ARTHROPLASTY Left 10/13/2014   Procedure: LEFT TOTAL KNEE ARTHROPLASTY;  Surgeon: Mcarthur Rossetti, MD;  Location: [facility];  Service: Orthopedics;  Laterality: Left;    There were no vitals filed for this visit.      Subjective Assessment - 08/10/16 1308    Subjective "Ok"   Currently in Pain? No/denies   Pain Score 0-No  pain            [facility] PT Assessment - 08/10/16 0001      AROM   Right Ankle Dorsiflexion 17                     [facility] Adult PT Treatment/Exercise - 08/10/16 0001      High Level Balance   High Level Balance Activities Negotiating over obstacles   High Level Balance Comments Lateral step overs onto airex     Ankle Exercises: Aerobic   Stationary Bike L1 x6 min     Ankle Exercises: Seated   Other Seated Ankle Exercises sit to stand LE on airex holding yellow ball 2x10   Other Seated Ankle Exercises Tband ankle 4 way blue x25     Ankle Exercises: Standing   Other Standing Ankle Exercises Heel and toe raises on airex x20 eaxh    Other Standing Ankle Exercises Heel walking 76f x2                  PT Short Term Goals - 06/28/16 1435      PT SHORT TERM GOAL #1   Title Independent with initial HEP by 07/11/16   Time 2   Period Weeks   Status New  PT Long Term Goals - 08/10/16 1319      PT LONG TERM GOAL #1   Title Independent with advanced HEP by 09/08/16   Status Achieved     PT LONG TERM GOAL #2   Title R ankle ROM WNL w/o pain by 09/08/16   Status Achieved     PT LONG TERM GOAL #3   Title R ankle strength >/= 4+/5 by 08/09/16   Status Partially Met     PT LONG TERM GOAL #4   Title B hip/knee strength >/= 4+/5 by 08/09/16   Status Partially Met     PT LONG TERM GOAL #5   Title Able to walk community distances w/o increased R ankle/heel pain by 08/09/16   Status Partially Met               Plan - 08/10/16 1344    Clinical Impression Statement pt is progressing towards all goals, she was able to complete all of todays interventions and reports no functional limitations at home.   Rehab Potential Good   Clinical Impairments Affecting Rehab Potential R knee OA in need of TKR, s/p L TKR in 2016   PT Frequency 2x / week   PT Duration 8 weeks   PT Treatment/Interventions Patient/family education;Therapeutic  exercise;Neuromuscular re-education;Gait training;Manual techniques;Passive range of motion;Electrical Stimulation;Cryotherapy;Vasopneumatic Device   PT Next Visit Plan D/C next visit      Patient will benefit from skilled therapeutic intervention in order to improve the following deficits and impairments:  Difficulty walking, Abnormal gait, Decreased range of motion, Impaired flexibility, Decreased strength, Decreased scar mobility, Decreased activity tolerance  Visit Diagnosis: Stiffness of right ankle, not elsewhere classified  Difficulty in walking, not elsewhere classified  Localized swelling, mass and lump, right lower limb  Pain in right ankle and joints of right foot  Chronic pain of left knee     Problem List Patient Active Problem List   Diagnosis Date Noted  . Essential hypertension 08/02/2016  . Anxiety and depression 08/02/2016  . Insomnia 08/02/2016  . Arthritis of left knee 10/13/2014  . Status post total left knee replacement 10/13/2014    Scot Jun 08/10/2016, 1:45 PM  [address] [place], [place], [address] Phone: [phone]   Fax:  [phone]  Name: Debra Wright MRN: [id number] Date of Birth: 1950-11-12

## 2016-08-16 ENCOUNTER — Ambulatory Visit: Payer: Medicare Other | Admitting: Physical Therapy

## 2016-09-05 ENCOUNTER — Telehealth: Payer: Self-pay | Admitting: Adult Health

## 2016-09-05 ENCOUNTER — Other Ambulatory Visit: Payer: Self-pay

## 2016-09-05 NOTE — Telephone Encounter (Signed)
° ° ° °  Pt request refill of the following:  eszopiclone (LUNESTA) 2 MG TABS tablet  Pt asked if rx can be written for 3 months    Phamacy:

## 2016-09-05 NOTE — Telephone Encounter (Signed)
I am sorry but I am only allowed to write for a month at a time ( legally). Ok to refill

## 2016-09-05 NOTE — Telephone Encounter (Signed)
Rx called in for one month as directed. Left message for patient to return phone call.

## 2016-09-05 NOTE — Telephone Encounter (Signed)
Please advise 

## 2016-09-06 NOTE — Telephone Encounter (Signed)
Patient notified

## 2016-10-10 DIAGNOSIS — M25571 Pain in right ankle and joints of right foot: Secondary | ICD-10-CM | POA: Diagnosis not present

## 2016-10-12 DIAGNOSIS — M79671 Pain in right foot: Secondary | ICD-10-CM | POA: Diagnosis not present

## 2016-10-12 DIAGNOSIS — M71571 Other bursitis, not elsewhere classified, right ankle and foot: Secondary | ICD-10-CM | POA: Diagnosis not present

## 2016-10-12 DIAGNOSIS — M7731 Calcaneal spur, right foot: Secondary | ICD-10-CM | POA: Diagnosis not present

## 2016-10-12 DIAGNOSIS — M25671 Stiffness of right ankle, not elsewhere classified: Secondary | ICD-10-CM | POA: Diagnosis not present

## 2016-10-13 ENCOUNTER — Other Ambulatory Visit: Payer: Self-pay | Admitting: Adult Health

## 2016-10-13 DIAGNOSIS — G4700 Insomnia, unspecified: Secondary | ICD-10-CM

## 2016-10-13 NOTE — Telephone Encounter (Signed)
Ok to refill for 30 days  

## 2016-10-13 NOTE — Telephone Encounter (Signed)
Ok to refill 

## 2016-10-17 DIAGNOSIS — M79671 Pain in right foot: Secondary | ICD-10-CM | POA: Diagnosis not present

## 2016-10-17 DIAGNOSIS — R531 Weakness: Secondary | ICD-10-CM | POA: Diagnosis not present

## 2016-10-17 DIAGNOSIS — M25671 Stiffness of right ankle, not elsewhere classified: Secondary | ICD-10-CM | POA: Diagnosis not present

## 2016-10-17 DIAGNOSIS — M71571 Other bursitis, not elsewhere classified, right ankle and foot: Secondary | ICD-10-CM | POA: Diagnosis not present

## 2016-10-18 ENCOUNTER — Other Ambulatory Visit: Payer: Self-pay | Admitting: Adult Health

## 2016-10-18 DIAGNOSIS — G4700 Insomnia, unspecified: Secondary | ICD-10-CM

## 2016-10-18 NOTE — Telephone Encounter (Signed)
Ok to refill for one month  

## 2016-10-20 DIAGNOSIS — Z1231 Encounter for screening mammogram for malignant neoplasm of breast: Secondary | ICD-10-CM | POA: Diagnosis not present

## 2016-10-20 LAB — HM MAMMOGRAPHY

## 2016-10-20 NOTE — Telephone Encounter (Signed)
Called to the pharmacy and left on machine. 

## 2016-10-24 ENCOUNTER — Encounter: Payer: Self-pay | Admitting: Adult Health

## 2016-10-27 DIAGNOSIS — M71571 Other bursitis, not elsewhere classified, right ankle and foot: Secondary | ICD-10-CM | POA: Diagnosis not present

## 2016-10-27 DIAGNOSIS — M7731 Calcaneal spur, right foot: Secondary | ICD-10-CM | POA: Diagnosis not present

## 2016-10-27 DIAGNOSIS — M79671 Pain in right foot: Secondary | ICD-10-CM | POA: Diagnosis not present

## 2016-10-27 DIAGNOSIS — M25671 Stiffness of right ankle, not elsewhere classified: Secondary | ICD-10-CM | POA: Diagnosis not present

## 2016-10-30 DIAGNOSIS — M71571 Other bursitis, not elsewhere classified, right ankle and foot: Secondary | ICD-10-CM | POA: Diagnosis not present

## 2016-10-30 DIAGNOSIS — M7731 Calcaneal spur, right foot: Secondary | ICD-10-CM | POA: Diagnosis not present

## 2016-10-30 DIAGNOSIS — M25671 Stiffness of right ankle, not elsewhere classified: Secondary | ICD-10-CM | POA: Diagnosis not present

## 2016-10-30 DIAGNOSIS — M79671 Pain in right foot: Secondary | ICD-10-CM | POA: Diagnosis not present

## 2016-10-31 ENCOUNTER — Ambulatory Visit (INDEPENDENT_AMBULATORY_CARE_PROVIDER_SITE_OTHER): Payer: Medicare Other | Admitting: Adult Health

## 2016-10-31 ENCOUNTER — Encounter: Payer: Self-pay | Admitting: Adult Health

## 2016-10-31 VITALS — BP 130/80 | Temp 98.1°F | Ht 63.0 in | Wt 214.8 lb

## 2016-10-31 DIAGNOSIS — G4730 Sleep apnea, unspecified: Secondary | ICD-10-CM

## 2016-10-31 DIAGNOSIS — E119 Type 2 diabetes mellitus without complications: Secondary | ICD-10-CM

## 2016-10-31 DIAGNOSIS — G4700 Insomnia, unspecified: Secondary | ICD-10-CM

## 2016-10-31 DIAGNOSIS — I10 Essential (primary) hypertension: Secondary | ICD-10-CM

## 2016-10-31 LAB — POCT GLYCOSYLATED HEMOGLOBIN (HGB A1C): Hemoglobin A1C: 6.7

## 2016-10-31 MED ORDER — ESZOPICLONE 3 MG PO TABS: 3.0000 mg | ORAL_TABLET | Freq: Every day | ORAL | 0 refills | Status: DC

## 2016-10-31 NOTE — Patient Instructions (Addendum)
Your A1c is 6.7, this has improved from 6.8   Someone from pulmonary will call you to schedule your appointment   Please start exercising more frequently.   I will see you in March

## 2016-10-31 NOTE — Progress Notes (Signed)
Subjective:    Patient ID: Debra Wright, female    DOB: Oct 09, 1950, 66 y.o.   MRN: [id number]  HPI  66 year old female who  has a past medical history of Anxiety; Arthritis; Basal cell carcinoma; Depression; Fibroid tumor; Frequent diarrhea; GERD (gastroesophageal reflux disease); High cholesterol; Hypertension; Insomnia; Migraine; Pernicious anemia; Type II diabetes mellitus ([place]); and Vitamin B 12 deficiency. She presents to the office today for three month follow up regarding diabetes and hypertension.   I last saw her on October 31st at which time her A1c was 6.8 and her blood pressure was 134/70. Her blood sugars have been between 120-164.   She would also like to be evaluated for sleep apnea. Per her husbands report, he has noticed that she often will have short episodes of holding her breath at night. She has not noticed waking in up in the middle of the night gasping for breath or feeling any more tired than she normally does.   She needs a refill of Lunesta.    Review of Systems  Constitutional: Negative.   HENT: Negative.   Respiratory: Negative.   Cardiovascular: Negative.   Neurological: Negative.   Hematological: Negative.   Psychiatric/Behavioral: Positive for sleep disturbance.  All other systems reviewed and are negative.  Past Medical History:  Diagnosis Date  . Anxiety   . Arthritis    "knees; right thumb" (10/14/2014)  . Basal cell carcinoma    "burned off upper lip & right shoulder"  . Depression   . Fibroid tumor   . Frequent diarrhea   . GERD (gastroesophageal reflux disease)   . High cholesterol   . Hypertension   . Insomnia   . Migraine    "frequently when I was younger; maybe monthly now" (10/14/2014)  . Pernicious anemia   . Type II diabetes mellitus ([place])   . Vitamin B 12 deficiency     Social History   Social History  . Marital status: Married    Spouse name: N/A  . Number of children: N/A  . Years of education: N/A   Occupational History    . Not on file.   Social History Main Topics  . Smoking status: Never Smoker  . Smokeless tobacco: Never Used  . Alcohol use Yes     Comment: 10/14/2014 "might have a drink when I'm out once/month"  . Drug use: No  . Sexual activity: Not Currently   Other Topics Concern  . 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 ABDOMINAL HYSTERECTOMY  1988  . ACHILLES TENDON SURGERY Right   . APPENDECTOMY  1988  . BILATERAL OOPHORECTOMY  2004  . CARPAL TUNNEL RELEASE Bilateral 1986  . CATARACT EXTRACTION W/ INTRAOCULAR LENS  IMPLANT, BILATERAL Bilateral 2011  . JOINT REPLACEMENT    . SHOULDER ARTHROSCOPY DISTAL CLAVICLE EXCISION AND OPEN ROTATOR CUFF REPAIR Right 11/2013  . TONSILLECTOMY  1950's  . TOTAL KNEE ARTHROPLASTY Left 10/13/2014   Procedure: LEFT TOTAL KNEE ARTHROPLASTY;  Surgeon: Mcarthur Rossetti, MD;  Location: [facility];  Service: Orthopedics;  Laterality: Left;    Family History  Problem Relation Age of Onset  . Arthritis Mother   . Hyperlipidemia Mother   . Diabetes Mother   . Hypertension Mother   . Arthritis Father   .  Hyperlipidemia Father   . Heart disease Father   . Diabetes Father   . Stroke Brother   . Hypertension Brother   . Diabetes Brother   . Arthritis Maternal Grandmother   . Hyperlipidemia Maternal Grandmother   . Diabetes Maternal Grandmother   . Hypertension Maternal Grandmother   . Arthritis Maternal Grandfather   . Hyperlipidemia Maternal Grandfather   . Hypertension Maternal Grandfather   . Arthritis Paternal Grandmother   . Hyperlipidemia Paternal Grandmother   . Diabetes Paternal Grandmother   . Hypertension Paternal Grandmother   . Arthritis Paternal Grandfather   . Hyperlipidemia Paternal Grandfather   . Diabetes Paternal Grandfather   . Hypertension Paternal Grandfather     Allergies  Allergen Reactions  . Antivert  [Meclizine Hcl] Other (See Comments)    "makes me feel like my skin is falling off".    Current Outpatient Prescriptions on File Prior to Visit  Medication Sig Dispense Refill  . amLODipine (NORVASC) 10 MG tablet Take 1 tablet (10 mg total) by mouth daily. 90 tablet 3  . Biotin 5000 MCG TABS Take 5,000 mcg by mouth daily.    . calcium carbonate (TUMS - DOSED IN MG ELEMENTAL CALCIUM) 500 MG chewable tablet Chew 2 tablets by mouth daily as needed for indigestion or heartburn.    . Calcium Carbonate-Vitamin D (CALCIUM 600 + D PO) Take 1 tablet by mouth daily.    . citalopram (CELEXA) 40 MG tablet Take 1 tablet (40 mg total) by mouth every morning. 90 tablet 3  . diazepam (VALIUM) 5 MG tablet Take 1 tablet (5 mg total) by mouth every 6 (six) hours as needed (dizziness). 10 tablet 0  . ibuprofen (ADVIL,MOTRIN) 200 MG tablet Take 400 mg by mouth every 6 (six) hours as needed for moderate pain.    . metFORMIN (GLUCOPHAGE) 1000 MG tablet Take 1 tablet (1,000 mg total) by mouth 2 (two) times daily with a meal. 180 tablet 2  . omega-3 acid ethyl esters (LOVAZA) 1 G capsule Take 2 g by mouth 2 (two) times daily.    Marland Kitchen omeprazole (PRILOSEC) 40 MG capsule Take 1 capsule (40 mg total) by mouth every morning. 90 capsule 3  . pravastatin (PRAVACHOL) 20 MG tablet Take 1 tablet (20 mg total) by mouth at bedtime. 90 tablet 3  . [DISCONTINUED] eszopiclone (LUNESTA) 2 MG TABS tablet TAKE ONE TABLET BY MOUTH ONCE DAILY AS NEEDED FOR SLEEP IMMEDIATELY  BEFORE  BEDTIME 30 tablet 0   No current facility-administered medications on file prior to visit.     BP (!) 152/58   Temp 98.1 F (36.7 C) (Oral)   Ht 5\' 3"  (1.6 m)   Wt 214 lb 12.8 oz (97.4 kg)   BMI 38.05 kg/m       Objective:   Physical Exam  Constitutional: She is oriented to person, place, and time. She appears well-developed and well-nourished. No distress.  Cardiovascular: Normal rate, regular rhythm, normal heart sounds and intact distal pulses.   Exam reveals no gallop and no friction rub.   No murmur heard. Pulmonary/Chest: Effort normal and breath sounds normal. No respiratory distress. She has no wheezes. She has no rales. She exhibits no tenderness.  Neurological: She is alert and oriented to person, place, and time.  Skin: Skin is warm and dry. No rash noted. She is not diaphoretic. No erythema. No pallor.  Psychiatric: She has a normal mood and affect. Her behavior is normal. Thought content normal.  Nursing note and  vitals reviewed.     Assessment & Plan:  1. Insomnia, unspecified type  - Eszopiclone (ESZOPICLONE) 3 MG TABS; Take 1 tablet (3 mg total) by mouth at bedtime. Take immediately before bedtime  Dispense: 30 tablet; Refill: 0  2. Essential hypertension - Near goal at this time.  - She has gained weight.  - Needs to start exercising and eating healthy  - No change in medication at this time.   3. Controlled type 2 diabetes mellitus without complication, without long-term current use of insulin (HCC)  - POC HgB A1c 6.7  - work on diet and exercise - no change in medications at this time   4. Sleep apnea, unspecified type  - Ambulatory referral to Pulmonology  Dorothyann Peng, NP

## 2016-11-07 DIAGNOSIS — R531 Weakness: Secondary | ICD-10-CM | POA: Diagnosis not present

## 2016-11-07 DIAGNOSIS — M79671 Pain in right foot: Secondary | ICD-10-CM | POA: Diagnosis not present

## 2016-11-07 DIAGNOSIS — M25671 Stiffness of right ankle, not elsewhere classified: Secondary | ICD-10-CM | POA: Diagnosis not present

## 2016-11-07 DIAGNOSIS — M71571 Other bursitis, not elsewhere classified, right ankle and foot: Secondary | ICD-10-CM | POA: Diagnosis not present

## 2016-11-09 DIAGNOSIS — M79671 Pain in right foot: Secondary | ICD-10-CM | POA: Diagnosis not present

## 2016-11-09 DIAGNOSIS — R531 Weakness: Secondary | ICD-10-CM | POA: Diagnosis not present

## 2016-11-09 DIAGNOSIS — M25671 Stiffness of right ankle, not elsewhere classified: Secondary | ICD-10-CM | POA: Diagnosis not present

## 2016-11-09 DIAGNOSIS — M71571 Other bursitis, not elsewhere classified, right ankle and foot: Secondary | ICD-10-CM | POA: Diagnosis not present

## 2016-11-14 DIAGNOSIS — R531 Weakness: Secondary | ICD-10-CM | POA: Diagnosis not present

## 2016-11-14 DIAGNOSIS — M71571 Other bursitis, not elsewhere classified, right ankle and foot: Secondary | ICD-10-CM | POA: Diagnosis not present

## 2016-11-14 DIAGNOSIS — M25671 Stiffness of right ankle, not elsewhere classified: Secondary | ICD-10-CM | POA: Diagnosis not present

## 2016-11-14 DIAGNOSIS — M79671 Pain in right foot: Secondary | ICD-10-CM | POA: Diagnosis not present

## 2016-11-16 DIAGNOSIS — M71571 Other bursitis, not elsewhere classified, right ankle and foot: Secondary | ICD-10-CM | POA: Diagnosis not present

## 2016-11-16 DIAGNOSIS — M25671 Stiffness of right ankle, not elsewhere classified: Secondary | ICD-10-CM | POA: Diagnosis not present

## 2016-11-16 DIAGNOSIS — M7731 Calcaneal spur, right foot: Secondary | ICD-10-CM | POA: Diagnosis not present

## 2016-11-16 DIAGNOSIS — M79671 Pain in right foot: Secondary | ICD-10-CM | POA: Diagnosis not present

## 2016-11-23 DIAGNOSIS — M25671 Stiffness of right ankle, not elsewhere classified: Secondary | ICD-10-CM | POA: Diagnosis not present

## 2016-11-23 DIAGNOSIS — R531 Weakness: Secondary | ICD-10-CM | POA: Diagnosis not present

## 2016-11-23 DIAGNOSIS — M71571 Other bursitis, not elsewhere classified, right ankle and foot: Secondary | ICD-10-CM | POA: Diagnosis not present

## 2016-11-23 DIAGNOSIS — M79671 Pain in right foot: Secondary | ICD-10-CM | POA: Diagnosis not present

## 2016-11-28 DIAGNOSIS — M79671 Pain in right foot: Secondary | ICD-10-CM | POA: Diagnosis not present

## 2016-11-28 DIAGNOSIS — M25671 Stiffness of right ankle, not elsewhere classified: Secondary | ICD-10-CM | POA: Diagnosis not present

## 2016-11-28 DIAGNOSIS — R531 Weakness: Secondary | ICD-10-CM | POA: Diagnosis not present

## 2016-11-28 DIAGNOSIS — M71571 Other bursitis, not elsewhere classified, right ankle and foot: Secondary | ICD-10-CM | POA: Diagnosis not present

## 2016-11-29 DIAGNOSIS — R531 Weakness: Secondary | ICD-10-CM | POA: Diagnosis not present

## 2016-11-29 DIAGNOSIS — M79671 Pain in right foot: Secondary | ICD-10-CM | POA: Diagnosis not present

## 2016-11-29 DIAGNOSIS — M25671 Stiffness of right ankle, not elsewhere classified: Secondary | ICD-10-CM | POA: Diagnosis not present

## 2016-11-29 DIAGNOSIS — M71571 Other bursitis, not elsewhere classified, right ankle and foot: Secondary | ICD-10-CM | POA: Diagnosis not present

## 2016-12-08 ENCOUNTER — Other Ambulatory Visit: Payer: Self-pay | Admitting: Adult Health

## 2016-12-08 DIAGNOSIS — G4700 Insomnia, unspecified: Secondary | ICD-10-CM

## 2016-12-08 NOTE — Telephone Encounter (Signed)
Rx called in as directed.   

## 2016-12-08 NOTE — Telephone Encounter (Signed)
Ok to refill 

## 2016-12-21 ENCOUNTER — Institutional Professional Consult (permissible substitution): Payer: Medicare Other | Admitting: Pulmonary Disease

## 2016-12-22 ENCOUNTER — Institutional Professional Consult (permissible substitution): Payer: Medicare Other | Admitting: Pulmonary Disease

## 2016-12-25 ENCOUNTER — Ambulatory Visit (INDEPENDENT_AMBULATORY_CARE_PROVIDER_SITE_OTHER): Payer: Medicare Other

## 2016-12-25 ENCOUNTER — Encounter (INDEPENDENT_AMBULATORY_CARE_PROVIDER_SITE_OTHER): Payer: Self-pay | Admitting: Physician Assistant

## 2016-12-25 ENCOUNTER — Ambulatory Visit (INDEPENDENT_AMBULATORY_CARE_PROVIDER_SITE_OTHER): Payer: Medicare Other | Admitting: Physician Assistant

## 2016-12-25 DIAGNOSIS — G8929 Other chronic pain: Secondary | ICD-10-CM

## 2016-12-25 DIAGNOSIS — M545 Low back pain: Secondary | ICD-10-CM | POA: Diagnosis not present

## 2016-12-25 MED ORDER — METHYLPREDNISOLONE 4 MG PO TABS: ORAL_TABLET | ORAL | 0 refills | Status: DC

## 2016-12-25 MED ORDER — CYCLOBENZAPRINE HCL 5 MG PO TABS: 5.0000 mg | ORAL_TABLET | Freq: Every day | ORAL | Status: DC

## 2016-12-25 NOTE — Progress Notes (Signed)
Office Visit Note   Patient: Debra Wright           Date of Birth: 04-16-1951           MRN: [id number] Visit Date: 12/25/2016              Requested by: Dorothyann Peng, NP [address] [place], [place] [address] PCP: Dorothyann Peng, NP   Assessment & Plan: Visit Diagnoses:  1. Chronic left-sided low back pain, with sciatica presence unspecified     Plan: Ice on a Depo-Medrol Dosepak she is to monitor her glucose levels which she does daily. Also place her on Flexeril at night. Moist heat to the back. We'll send her physical therapy for back exercises modalities hamstring stretching. See her back in 4 weeks check her progress lack of if she continues to have severe back pain consider MRI.  Follow-Up Instructions: Return in about 4 weeks (around 01/22/2017).   Orders:  Orders Placed This Encounter  Procedures  . XR Lumbar Spine 2-3 Views  . Ambulatory referral to Physical Therapy   Meds ordered this encounter  Medications  . cyclobenzaprine (FLEXERIL) tablet 5 mg  . methylPREDNISolone (MEDROL) 4 MG tablet    Sig: Take care as directed    Dispense:  21 tablet    Refill:  0      Procedures: No procedures performed   Clinical Data: No additional findings.   Subjective: Chief Complaint  Patient presents with  . Lower Back - Pain    Patient presents with low back pain that radiates down into left buttock. 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 She states that the pain got worse over the years and she was seen by a chiropractor who stated he could not help her. She moved to [place], [place] and a physician there put her on high doses of Celexa which gave her good relief. Her copay increased and she had to stop this treatment. Over the last several months, she feels like there is a knot in her left buttock. She is in a lot of pain. It is very difficult for her to stand for long  periods of time. She denies numbness and tingling. The pain every once in a while radiates behind the left knee. She has tried ice, heat, and ibuprofen.   States that her low back pain has became worse over the past 4-6 months does awaken her. She's tried heat cold ibuprofen and nothing seems to be helping. She's had low back pain on and off for years and reports that she had some epidural steroid injections in the past sterile had an MRI of her back. She's gone to physical therapy in the past also.  Review of Systems Denies any radicular symptoms down either leg. No acute injury lumbar spine. Back pain does awaken her. Please see history of present illness otherwise  Objective: Vital Signs: There were no vitals taken for this visit.  Physical Exam  Constitutional: She is oriented to person, place, and time. She appears well-developed and well-nourished. No distress.  Cardiovascular: Intact distal pulses.   Pulmonary/Chest: Effort normal.  Neurological: She is alert and oriented to person, place, and time.  Skin: She is not diaphoretic.  Psychiatric: She has a normal mood and affect. Her behavior is normal.    Ortho Exam Negative straight leg raise bilaterally. Good range of motion  of both hips. 5 out of 5 strengths lower extremities against resistance bilaterally. Deep tendon reflexes are 2+ at the knees and 1+ at the ankles and equal and symmetric. Tenderness over the lower lumbar spinal column.  Specialty Comments:  No specialty comments available.  Imaging: Xr Lumbar Spine 2-3 Views  Result Date: 12/25/2016 AP lateral lumbar spine: No acute fractures. Grade 1 spondylolisthesis L5 on S1.Overall well maintained. Lower lumbar facet joint arthritic changes. Normal Lordotic curvature    PMFS History: Patient Active Problem List   Diagnosis Date Noted  . Controlled type 2 diabetes mellitus without complication, without long-term current use of insulin ([place]) 10/31/2016  . Essential  hypertension 08/02/2016  . Anxiety and depression 08/02/2016  . Insomnia 08/02/2016  . Arthritis of left knee 10/13/2014  . Status post total left knee replacement 10/13/2014   Past Medical History:  Diagnosis Date  . Anxiety   . Arthritis    "knees; right thumb" (10/14/2014)  . Basal cell carcinoma    "burned off upper lip & right shoulder"  . Depression   . Fibroid tumor   . Frequent diarrhea   . GERD (gastroesophageal reflux disease)   . High cholesterol   . Hypertension   . Insomnia   . Migraine    "frequently when I was younger; maybe monthly now" (10/14/2014)  . Pernicious anemia   . Type II diabetes mellitus ([place])   . Vitamin B 12 deficiency     Family History  Problem Relation Age of Onset  . Arthritis Mother   . Hyperlipidemia Mother   . Diabetes Mother   . Hypertension Mother   . Arthritis Father   . Hyperlipidemia Father   . Heart disease Father   . Diabetes Father   . Stroke Brother   . Hypertension Brother   . Diabetes Brother   . Arthritis Maternal Grandmother   . Hyperlipidemia Maternal Grandmother   . Diabetes Maternal Grandmother   . Hypertension Maternal Grandmother   . Arthritis Maternal Grandfather   . Hyperlipidemia Maternal Grandfather   . Hypertension Maternal Grandfather   . Arthritis Paternal Grandmother   . Hyperlipidemia Paternal Grandmother   . Diabetes Paternal Grandmother   . Hypertension Paternal Grandmother   . Arthritis Paternal Grandfather   . Hyperlipidemia Paternal Grandfather   . Diabetes Paternal Grandfather   . Hypertension Paternal Grandfather     Past Surgical History:  Procedure Laterality Date  . ABDOMINAL HYSTERECTOMY  1988  . ACHILLES TENDON SURGERY Right   . APPENDECTOMY  1988  . BILATERAL OOPHORECTOMY  2004  . CARPAL TUNNEL RELEASE Bilateral 1986  . CATARACT EXTRACTION W/ INTRAOCULAR LENS  IMPLANT, BILATERAL Bilateral 2011  . JOINT REPLACEMENT    . SHOULDER ARTHROSCOPY DISTAL CLAVICLE EXCISION AND OPEN  ROTATOR CUFF REPAIR Right 11/2013  . TONSILLECTOMY  1950's  . TOTAL KNEE ARTHROPLASTY Left 10/13/2014   Procedure: LEFT TOTAL KNEE ARTHROPLASTY;  Surgeon: Mcarthur Rossetti, MD;  Location: [facility];  Service: Orthopedics;  Laterality: Left;   Social History   Occupational History  . Not on file.   Social History Main Topics  . Smoking status: Never Smoker  . Smokeless tobacco: Never Used  . Alcohol use Yes     Comment: 10/14/2014 "might have a drink when I'm out once/month"  . Drug use: No  . Sexual activity: Not Currently

## 2016-12-26 ENCOUNTER — Other Ambulatory Visit (INDEPENDENT_AMBULATORY_CARE_PROVIDER_SITE_OTHER): Payer: Self-pay

## 2016-12-26 ENCOUNTER — Telehealth (INDEPENDENT_AMBULATORY_CARE_PROVIDER_SITE_OTHER): Payer: Self-pay | Admitting: Physician Assistant

## 2016-12-26 MED ORDER — CYCLOBENZAPRINE HCL 5 MG PO TABS: ORAL_TABLET | ORAL | 0 refills | Status: DC

## 2016-12-26 NOTE — Telephone Encounter (Signed)
Faxed

## 2016-12-26 NOTE — Telephone Encounter (Signed)
Please advise 

## 2016-12-26 NOTE — Telephone Encounter (Signed)
Patient states Rx for Flexeril is not at her pharmacy [facility])

## 2016-12-26 NOTE — Progress Notes (Unsigned)
.  fle

## 2016-12-26 NOTE — Telephone Encounter (Signed)
Please send again flexeril 5mg  one po qhs

## 2016-12-27 ENCOUNTER — Telehealth: Payer: Self-pay | Admitting: Adult Health

## 2016-12-27 NOTE — Telephone Encounter (Signed)
Pt need new Rx for diazepam  Pharm:  [facility] in [address].  Pt is in a lot of pain and would really like it.

## 2016-12-27 NOTE — Telephone Encounter (Signed)
She was prescribed Flexeril two days ago. Is this not working?

## 2016-12-27 NOTE — Telephone Encounter (Signed)
I contacted patient. She states that the flexeril is working. Patient states that her precious pcp, Dr. Loann Quill used to prescribe this medication, and she wanted to know if you would refill it for her?

## 2016-12-27 NOTE — Telephone Encounter (Signed)
Please advise on refill.

## 2016-12-28 NOTE — Telephone Encounter (Signed)
Patient notified.  Patient states she would like to discuss this medication at her upcoming appt. I notified Cory.

## 2016-12-28 NOTE — Telephone Encounter (Signed)
I do not feel comfortable refilling this medication

## 2016-12-29 ENCOUNTER — Ambulatory Visit: Payer: Medicare Other | Admitting: Adult Health

## 2017-01-02 DIAGNOSIS — M5412 Radiculopathy, cervical region: Secondary | ICD-10-CM | POA: Diagnosis not present

## 2017-01-02 DIAGNOSIS — M4310 Spondylolisthesis, site unspecified: Secondary | ICD-10-CM | POA: Diagnosis not present

## 2017-01-02 DIAGNOSIS — M5030 Other cervical disc degeneration, unspecified cervical region: Secondary | ICD-10-CM | POA: Diagnosis not present

## 2017-01-02 DIAGNOSIS — M5440 Lumbago with sciatica, unspecified side: Secondary | ICD-10-CM | POA: Diagnosis not present

## 2017-01-02 DIAGNOSIS — M546 Pain in thoracic spine: Secondary | ICD-10-CM | POA: Diagnosis not present

## 2017-01-02 DIAGNOSIS — R29898 Other symptoms and signs involving the musculoskeletal system: Secondary | ICD-10-CM | POA: Diagnosis not present

## 2017-01-02 DIAGNOSIS — M5416 Radiculopathy, lumbar region: Secondary | ICD-10-CM | POA: Diagnosis not present

## 2017-01-02 DIAGNOSIS — M5136 Other intervertebral disc degeneration, lumbar region: Secondary | ICD-10-CM | POA: Diagnosis not present

## 2017-01-02 DIAGNOSIS — M549 Dorsalgia, unspecified: Secondary | ICD-10-CM | POA: Diagnosis not present

## 2017-01-02 DIAGNOSIS — M47816 Spondylosis without myelopathy or radiculopathy, lumbar region: Secondary | ICD-10-CM | POA: Diagnosis not present

## 2017-01-02 DIAGNOSIS — M4722 Other spondylosis with radiculopathy, cervical region: Secondary | ICD-10-CM | POA: Diagnosis not present

## 2017-01-02 DIAGNOSIS — M4300 Spondylolysis, site unspecified: Secondary | ICD-10-CM | POA: Diagnosis not present

## 2017-01-03 ENCOUNTER — Other Ambulatory Visit: Payer: Self-pay | Admitting: Neurosurgery

## 2017-01-03 ENCOUNTER — Ambulatory Visit (INDEPENDENT_AMBULATORY_CARE_PROVIDER_SITE_OTHER): Payer: Medicare Other | Admitting: Adult Health

## 2017-01-03 ENCOUNTER — Encounter: Payer: Self-pay | Admitting: Adult Health

## 2017-01-03 VITALS — BP 124/60 | Ht 63.0 in | Wt 214.0 lb

## 2017-01-03 DIAGNOSIS — M5440 Lumbago with sciatica, unspecified side: Secondary | ICD-10-CM

## 2017-01-03 DIAGNOSIS — E119 Type 2 diabetes mellitus without complications: Secondary | ICD-10-CM | POA: Diagnosis not present

## 2017-01-03 DIAGNOSIS — I10 Essential (primary) hypertension: Secondary | ICD-10-CM | POA: Diagnosis not present

## 2017-01-03 DIAGNOSIS — Z0000 Encounter for general adult medical examination without abnormal findings: Secondary | ICD-10-CM | POA: Diagnosis not present

## 2017-01-03 DIAGNOSIS — F418 Other specified anxiety disorders: Secondary | ICD-10-CM | POA: Diagnosis not present

## 2017-01-03 DIAGNOSIS — F32A Depression, unspecified: Secondary | ICD-10-CM

## 2017-01-03 DIAGNOSIS — M4692 Unspecified inflammatory spondylopathy, cervical region: Secondary | ICD-10-CM

## 2017-01-03 DIAGNOSIS — F419 Anxiety disorder, unspecified: Secondary | ICD-10-CM

## 2017-01-03 DIAGNOSIS — F329 Major depressive disorder, single episode, unspecified: Secondary | ICD-10-CM

## 2017-01-03 DIAGNOSIS — M5412 Radiculopathy, cervical region: Principal | ICD-10-CM

## 2017-01-03 LAB — POCT GLYCOSYLATED HEMOGLOBIN (HGB A1C): Hemoglobin A1C: 7.5

## 2017-01-03 NOTE — Progress Notes (Signed)
Subjective:    Patient ID: Debra Wright, female    DOB: Jul 06, 1951, 66 y.o.   MRN: [id number]  HPI  66 year old female who Presents to the office for follow-up regarding hypertension and diabetes.  She currently takes amlodipine 10 mg daily for blood pressure control. She also takes metformin 1000 mg BID to help control her blood sugars. Her last A1c in January was 6.7. Does report being on a recent steroid taper for back pain  Lab Results  Component Value Date   HGBA1C 7.5 01/03/2017    BP Readings from Last 3 Encounters:  01/03/17 124/60  10/31/16 130/80  08/01/16 134/70   He would also like a refill of her Valium that she takes for anxiety attacks. It appears through the [place] controlled substance database that she last had a refill of this medication in January 2018 by Dr. Velna Hatchet  Review of Systems See HPI  Past Medical History:  Diagnosis Date  . Anxiety   . Arthritis    "knees; right thumb" (10/14/2014)  . Basal cell carcinoma    "burned off upper lip & right shoulder"  . Depression   . Fibroid tumor   . Frequent diarrhea   . GERD (gastroesophageal reflux disease)   . High cholesterol   . Hypertension   . Insomnia   . Migraine    "frequently when I was younger; maybe monthly now" (10/14/2014)  . Pernicious anemia   . Type II diabetes mellitus ([place])   . Vitamin B 12 deficiency     Social History   Social History  . Marital status: Married    Spouse name: N/A  . Number of children: N/A  . Years of education: N/A   Occupational History  . Not on file.   Social History Main Topics  . Smoking status: Never Smoker  . Smokeless tobacco: Never Used  . Alcohol use Yes     Comment: 10/14/2014 "might have a drink when I'm out once/month"  . Drug use: No  . Sexual activity: Not Currently   Other Topics Concern  . 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 ABDOMINAL HYSTERECTOMY  1988  . ACHILLES TENDON SURGERY Right   . APPENDECTOMY  1988  . BILATERAL OOPHORECTOMY  2004  . CARPAL TUNNEL RELEASE Bilateral 1986  . CATARACT EXTRACTION W/ INTRAOCULAR LENS  IMPLANT, BILATERAL Bilateral 2011  . JOINT REPLACEMENT    . SHOULDER ARTHROSCOPY DISTAL CLAVICLE EXCISION AND OPEN ROTATOR CUFF REPAIR Right 11/2013  . TONSILLECTOMY  1950's  . TOTAL KNEE ARTHROPLASTY Left 10/13/2014   Procedure: LEFT TOTAL KNEE ARTHROPLASTY;  Surgeon: Mcarthur Rossetti, MD;  Location: [facility];  Service: Orthopedics;  Laterality: Left;    Family History  Problem Relation Age of Onset  . Arthritis Mother   . Hyperlipidemia Mother   . Diabetes Mother   . Hypertension Mother   . Arthritis Father   . Hyperlipidemia Father   . Heart disease Father   . Diabetes Father   . Stroke Brother   . Hypertension Brother   . Diabetes Brother   . Arthritis Maternal Grandmother   . Hyperlipidemia Maternal Grandmother   . Diabetes Maternal Grandmother   . Hypertension Maternal Grandmother   . Arthritis Maternal Grandfather   .  Hyperlipidemia Maternal Grandfather   . Hypertension Maternal Grandfather   . Arthritis Paternal Grandmother   . Hyperlipidemia Paternal Grandmother   . Diabetes Paternal Grandmother   . Hypertension Paternal Grandmother   . Arthritis Paternal Grandfather   . Hyperlipidemia Paternal Grandfather   . Diabetes Paternal Grandfather   . Hypertension Paternal Grandfather     Allergies  Allergen Reactions  . Antivert [Meclizine Hcl] Other (See Comments)    "makes me feel like my skin is falling off".    Current Outpatient Prescriptions on File Prior to Visit  Medication Sig Dispense Refill  . amLODipine (NORVASC) 10 MG tablet Take 1 tablet (10 mg total) by mouth daily. 90 tablet 3  . Biotin 5000 MCG TABS Take 5,000 mcg by mouth daily.    . calcium carbonate (TUMS - DOSED IN MG ELEMENTAL CALCIUM) 500 MG chewable tablet  Chew 2 tablets by mouth daily as needed for indigestion or heartburn.    . Calcium Carbonate-Vitamin D (CALCIUM 600 + D PO) Take 1 tablet by mouth daily.    . citalopram (CELEXA) 40 MG tablet Take 1 tablet (40 mg total) by mouth every morning. 90 tablet 3  . cyclobenzaprine (FLEXERIL) 5 MG tablet Take one by mouth QHS 30 tablet 0  . diazepam (VALIUM) 5 MG tablet Take 1 tablet (5 mg total) by mouth every 6 (six) hours as needed (dizziness). 10 tablet 0  . Eszopiclone 3 MG TABS TAKE 1 TABLET BY MOUTH ONCE DAILY IMMEDIATELY BEDTIME 30 tablet 0  . ibuprofen (ADVIL,MOTRIN) 200 MG tablet Take 400 mg by mouth every 6 (six) hours as needed for moderate pain.    . metFORMIN (GLUCOPHAGE) 1000 MG tablet Take 1 tablet (1,000 mg total) by mouth 2 (two) times daily with a meal. 180 tablet 2  . omega-3 acid ethyl esters (LOVAZA) 1 G capsule Take 2 g by mouth 2 (two) times daily.    Marland Kitchen omeprazole (PRILOSEC) 40 MG capsule Take 1 capsule (40 mg total) by mouth every morning. 90 capsule 3  . pravastatin (PRAVACHOL) 20 MG tablet Take 1 tablet (20 mg total) by mouth at bedtime. 90 tablet 3   No current facility-administered medications on file prior to visit.     BP 124/60 (BP Location: Right Arm, Patient Position: Sitting, Cuff Size: Normal)   Ht 5\' 3"  (1.6 m)   Wt 214 lb (97.1 kg)   BMI 37.91 kg/m       Objective:   Physical Exam  Constitutional: She is oriented to person, place, and time. She appears well-developed and well-nourished. No distress.  Cardiovascular: Normal rate, regular rhythm, normal heart sounds and intact distal pulses.  Exam reveals no gallop and no friction rub.   No murmur heard. Pulmonary/Chest: Effort normal and breath sounds normal. No respiratory distress. She has no wheezes. She has no rales. She exhibits no tenderness.  Neurological: She is alert and oriented to person, place, and time.  Skin: Skin is warm and dry. No rash noted. She is not diaphoretic. No erythema. No pallor.    Psychiatric: She has a normal mood and affect. Her behavior is normal. Judgment and thought content normal.  Nursing note and vitals reviewed.     Assessment & Plan:  1. Controlled type 2 diabetes mellitus without complication, without long-term current use of insulin (HCC) Lab Results  Component Value Date   HGBA1C 7.5 01/03/2017   - POC HgB A1c- Has increased to 7.5 and she left before results were available. She  has an appointment next week at which time we'll talk about  adding glipizide  2. Essential hypertension - Well controlled on current medication   3. Anxiety and depression - I advised her that I have not prescribed this medication to her in the past and that this is not a medication that I would feel comfortable prescribing to her. I advised her that she needs to follow-up with psychiatry for her anxiety and depression. She is hesitant to do this. I stand firm for not prescribing Valium to Ms. Mino as I think she needs to be further evaluated   Dorothyann Peng, NP

## 2017-01-03 NOTE — Progress Notes (Addendum)
Subjective:   Debra Wright is a 66 y.o. female who presents for Medicare Annual (Subsequent) preventive examination.  The Patient was informed that the wellness visit is to identify future health risk and educate and initiate measures that can reduce risk for increased disease through the lifespan.    NO ROS; Medicare Wellness Visit  Describes health as good, fair or great? Fair; under eval for back  Will schedule a medical management for labs with Tommi Rumps when leaving  Preventive Screening -Counseling & Management  Dexa scan  04/2016;  Mammogram; Jan 2018  Eye test is to be scheduled this year but postponed in Feb due to other health issues.   Smoking history; no hx   ETOH- rare   Medication adherence or issues? 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 No;  Safety;  community, safe wears sunscreen, yes  safe place for firearms;  keep in a safe place if you have them  Motor vehicle accidents; no   Hearing Screening Comments: Hearing is good Vision Screening Comments: Cataracts from both eyes  Dr. Jabier Mutton Wears reading glasses  no diabetic retinopathy Exam prior to July    Mental Health:  Johnnye Sima for insomnia Hx of Panic disorder; Did not have good experience with a psychiatrist  Recommended she consider counseling to have a "go to" person if needed and may be some new techniques she can learn for panic d/o as she has never been taught breathing or other exercises; only given med  Feels most of her issues are around 5 surgeries and also lack of sleep and care giving  Agrees this may be helpful. States she is not depressed today, but this comes and goes. Given the number for [employer] health and agreed to call and scheduled an apt with Dr. Glennon Hamilton, who is here at the office.     Tried med for urinary urgency and stress incont but could not tolerate the meds  No GYN at present; Hx TAH    Activities of Daily Living - See functional screen   Cognitive testing; Ad8 score; 0 or less than 2  MMSE deferred or completed if AD8 + 2 issues   Advanced Directives - yes this is completed   Patient Care Team: [facility]  Nafziger, NP as PCP - General (Family Medicine)   Immunization History  Administered Date(s) Administered  . Pneumococcal Conjugate-13 02/17/2014  . Pneumococcal Polysaccharide-23 11/17/2014  . Pneumococcal-Unspecified 02/17/2014  . Tdap 10/02/2013   Required Immunizations needed today  Screening test up to date or reviewed for plan of completion Health Maintenance Due  Topic Date Due  . Hepatitis C Screening  09-07-1951  . URINE MICROALBUMIN  12/08/1958  . PNA vac Low Risk Adult (2 of 2 - PCV13) 11/18/2015   Medicare now request all "baby boomers" test for possible exposure to Hepatitis C. Many may have been exposed due to dental work, tatoo's, vaccinations when young. The Hepatitis C  virus is dormant for many years and then sometimes will cause liver cancer. If you gave blood in the past 15 years, you were most likely checked for Hep C. If you rec'd blood; you may want to consider testing or if you are high risk for any other reason.   Foot exam completed today  Pneumonia series has been completed; will edit health maintenance field         Objective:     Vitals: BP 124/60 (BP Location: Right Arm, Patient Position: Sitting, Cuff Size: Normal)   Ht 5\' 3"  (1.6 m)   Wt 214 lb (97.1 kg)   BMI 37.91 kg/m   Body mass index is 37.91 kg/m.   Tobacco History  Smoking Status  . Never Smoker  Smokeless Tobacco  . Never Used     Counseling given: Yes   Past Medical History:  Diagnosis Date  . Anxiety   . Arthritis    "knees; right thumb" (10/14/2014)  . Basal cell carcinoma    "burned off upper lip & right shoulder"  . Depression   . Fibroid tumor   . Frequent diarrhea   . GERD (gastroesophageal reflux disease)   . High cholesterol   . Hypertension   . Insomnia   . Migraine    "frequently when I was younger; maybe monthly now" (10/14/2014)  . Pernicious anemia   . Type II diabetes mellitus ([place])   . Vitamin B 12 deficiency    Past Surgical History:  Procedure Laterality Date  . ABDOMINAL HYSTERECTOMY  1988  . ACHILLES TENDON SURGERY Right   . APPENDECTOMY  1988  . BILATERAL OOPHORECTOMY  2004  . CARPAL TUNNEL RELEASE Bilateral 1986  . CATARACT EXTRACTION W/ INTRAOCULAR LENS  IMPLANT, BILATERAL Bilateral 2011  . JOINT REPLACEMENT    . SHOULDER ARTHROSCOPY DISTAL CLAVICLE EXCISION AND OPEN ROTATOR CUFF REPAIR Right 11/2013  . TONSILLECTOMY  1950's  . TOTAL KNEE ARTHROPLASTY Left 10/13/2014   Procedure: LEFT TOTAL KNEE ARTHROPLASTY;  Surgeon: Mcarthur Rossetti, MD;  Location: [facility];  Service: Orthopedics;  Laterality: Left;   Family History  Problem Relation Age of Onset  . Arthritis Mother   . Hyperlipidemia Mother   . Diabetes Mother   .  Hypertension Mother   . Arthritis Father   . Hyperlipidemia Father   . Heart disease Father   . Diabetes Father   . Stroke Brother   . Hypertension Brother   . Diabetes Brother   . Arthritis Maternal Grandmother   . Hyperlipidemia Maternal Grandmother   . Diabetes Maternal Grandmother   . Hypertension Maternal Grandmother   . Arthritis Maternal Grandfather   . Hyperlipidemia Maternal Grandfather   . Hypertension Maternal Grandfather   . Arthritis Paternal Grandmother   . Hyperlipidemia Paternal Grandmother   . Diabetes Paternal  Grandmother   . Hypertension Paternal Grandmother   . Arthritis Paternal Grandfather   . Hyperlipidemia Paternal Grandfather   . Diabetes Paternal Grandfather   . Hypertension Paternal Grandfather    History  Sexual Activity  . Sexual activity: Not Currently    Outpatient Encounter Prescriptions as of 01/03/2017  Medication Sig  . amLODipine (NORVASC) 10 MG tablet Take 1 tablet (10 mg total) by mouth daily.  . Biotin 5000 MCG TABS Take 5,000 mcg by mouth daily.  . calcium carbonate (TUMS - DOSED IN MG ELEMENTAL CALCIUM) 500 MG chewable tablet Chew 2 tablets by mouth daily as needed for indigestion or heartburn.  . Calcium Carbonate-Vitamin D (CALCIUM 600 + D PO) Take 1 tablet by mouth daily.  . citalopram (CELEXA) 40 MG tablet Take 1 tablet (40 mg total) by mouth every morning.  . cyclobenzaprine (FLEXERIL) 5 MG tablet Take one by mouth QHS  . diazepam (VALIUM) 5 MG tablet Take 1 tablet (5 mg total) by mouth every 6 (six) hours as needed (dizziness).  . Eszopiclone 3 MG TABS TAKE 1 TABLET BY MOUTH ONCE DAILY IMMEDIATELY BEDTIME  . ibuprofen (ADVIL,MOTRIN) 200 MG tablet Take 400 mg by mouth every 6 (six) hours as needed for moderate pain.  . metFORMIN (GLUCOPHAGE) 1000 MG tablet Take 1 tablet (1,000 mg total) by mouth 2 (two) times daily with a meal.  . omega-3 acid ethyl esters (LOVAZA) 1 G capsule Take 2 g by mouth 2 (two) times daily.  Marland Kitchen omeprazole  (PRILOSEC) 40 MG capsule Take 1 capsule (40 mg total) by mouth every morning.  . pravastatin (PRAVACHOL) 20 MG tablet Take 1 tablet (20 mg total) by mouth at bedtime.  . [DISCONTINUED] methylPREDNISolone (MEDROL) 4 MG tablet Take care as directed  . [DISCONTINUED] cyclobenzaprine (FLEXERIL) tablet 5 mg    No facility-administered encounter medications on file as of 01/03/2017.     Activities of Daily Living In your present state of health, do you have any difficulty performing the following activities: 01/03/2017  Hearing? N  Vision? N  Difficulty concentrating or making decisions? N  Walking or climbing stairs? Y  Dressing or bathing? N  Doing errands, shopping? N  Preparing Food and eating ? N  Using the Toilet? N  In the past six months, have you accidently leaked urine? Y  Do you have problems with loss of bowel control? N  Managing your Medications? N  Managing your Finances? N  Housekeeping or managing your Housekeeping? N  Some recent data might be hidden    Patient Care Team: Dorothyann Peng, NP as PCP - General (Family Medicine)    Assessment:   Exercise Activities and Dietary recommendations    Goals    . Exercise 150 minutes per week (moderate activity)          May try walking in the pool go slow       Fall Risk Fall Risk  01/03/2017  Falls in the past year? Yes  Number falls in past yr: 1  Follow up Education provided   Depression Screen PHQ 2/9 Scores 01/03/2017  PHQ - 2 Score 0     Cognitive Function MMSE - Mini Mental State Exam 01/03/2017  Not completed: (No Data)    Ad8 csore is 0     Immunization History  Administered Date(s) Administered  . Pneumococcal Conjugate-13 02/17/2014  . Pneumococcal Polysaccharide-23 11/17/2014  . Pneumococcal-Unspecified 02/17/2014  . Tdap 10/02/2013   Screening Tests Health Maintenance  Topic Date Due  .  Hepatitis C Screening  [birth year]  . URINE MICROALBUMIN  12/08/1958  . PNA vac Low Risk Adult (2 of 2 -  PCV13) 11/18/2015  . OPHTHALMOLOGY EXAM  05/01/2017 (Originally 11/22/2016)  . HEMOGLOBIN A1C  04/30/2017  . INFLUENZA VACCINE  05/02/2017  . FOOT EXAM  01/03/2018  . MAMMOGRAM  10/20/2018  . TETANUS/TDAP  10/03/2023  . COLONOSCOPY  10/02/2024  . DEXA SCAN  Completed      Plan:      PCP Notes  Health Maintenance Tried to take out pneumonia vaccine series PSV given 01/2014 p she was 63 and PSV 23 was given 11/17/2014   Hep c to be drawn; scheduled medical exam Urine albumin to be done at CPE apt   Abnormal Screens to come back to have lab work and medical management exam   Referrals  Has a hx of panic d/o; requesting valium; discussed seeing Dr. Glennon Hamilton for some assistance in managing episodes. Admits they have not occurred in while and cymbalta is helping her.  Expressed interest in learning techniques that may be helpful, as well as having someone to "check in with" as needed to due hx of depression and ongoing care giving role in lieu of her own issues   Patient concerns;  Discussed Valium and agreed to call and fup with [place]; Denies depression today but admits to  Issues some days.  Also has difficulty sleeping.   Nurse Concerns; none  Next PCP apt 01/10/2017 for medical management and labs    During the course of the visit the patient was educated and counseled about the following appropriate screening and preventive services:   Vaccines to include Pneumoccal, Influenza, Hepatitis B, Td, Zostavax, HCV  Electrocardiogram  Cardiovascular Disease  Colorectal cancer screening due 10/2024  Bone density screening - normal  Diabetes screening within good control last blood draw  Glaucoma screening neg  Mammography/ this year   Nutrition counseling; discussed   Patient Instructions (the written plan) was given to the patient.   LPFXT,KWIOX, RN  01/03/2017  I have reviewed and agree with this plan of care - Dorothyann Peng, AGNP

## 2017-01-03 NOTE — Patient Instructions (Addendum)
Debra Wright , Thank you for taking time to come for your Medicare Wellness Visit. I appreciate your ongoing commitment to your health goals. Please review the following plan we discussed and let me know if I can assist you in the future.   Will schedule an eye exam as you are able   May benefit from learning techniques to manage panic;  Can see Dr. Glennon Hamilton by calling [facility] at [phone]  Will make apt for labs and medical management with Tommi Rumps   These are the goals we discussed: Goals    . Exercise 150 minutes per week (moderate activity)          May try walking in the pool go slow        This is a list of the screening recommended for you and due dates:  Health Maintenance  Topic Date Due  .  Hepatitis C: One time screening is recommended by Center for Disease Control  (CDC) for  adults born from [birth year] through 1967.   October 22, 1950  . Urine Protein Check  12/08/1958  . Pneumonia vaccines (2 of 2 - PCV13) 11/18/2015  . Eye exam for diabetics  05/01/2017*  . Hemoglobin A1C  04/30/2017  . Flu Shot  05/02/2017  . Complete foot exam   01/03/2018  . Mammogram  10/20/2018  . Tetanus Vaccine  10/03/2023  . Colon Cancer Screening  10/02/2024  . DEXA scan (bone density measurement)  Completed  *Topic was postponed. The date shown is not the original due date.    Health Maintenance for Postmenopausal Women Menopause is a normal process in which your reproductive ability comes to an end. This process happens gradually over a span of months to years, usually between the ages of 21 and 40. Menopause is complete when you have missed 12 consecutive menstrual periods. It is important to talk with your health care provider about some of the most common conditions that affect postmenopausal women, such as heart disease, cancer, and bone loss (osteoporosis). Adopting a healthy lifestyle and getting preventive care can help to promote your health and wellness. Those actions can also  lower your chances of developing some of these common conditions. What should I know about menopause? During menopause, you may experience a number of symptoms, such as:  Moderate-to-severe hot flashes.  Night sweats.  Decrease in sex drive.  Mood swings.  Headaches.  Tiredness.  Irritability.  Memory problems.  Insomnia. Choosing to treat or not to treat menopausal changes is an individual decision that you make with your health care provider. What should I know about hormone replacement therapy and supplements? Hormone therapy products are effective for treating symptoms that are associated with menopause, such as hot flashes and night sweats. Hormone replacement carries certain risks, especially as you become older. If you are thinking about using estrogen or estrogen with progestin treatments, discuss the benefits and risks with your health care provider. What should I know about heart disease and stroke? Heart disease, heart attack, and stroke become more likely as you age. This may be due, in part, to the hormonal changes that your body experiences during menopause. These can affect how your body processes dietary fats, triglycerides, and cholesterol. Heart attack and stroke are both medical emergencies. There are many things that you can do to help prevent heart disease and stroke:  Have your blood pressure checked at least every 1-2 years. High blood pressure causes heart disease and increases the risk of stroke.  If  you are 37-55 years old, ask your health care provider if you should take aspirin to prevent a heart attack or a stroke.  Do not use any tobacco products, including cigarettes, chewing tobacco, or electronic cigarettes. If you need help quitting, ask your health care provider.  It is important to eat a healthy diet and maintain a healthy weight.  Be sure to include plenty of vegetables, fruits, low-fat dairy products, and lean protein.  Avoid eating foods  that are high in solid fats, added sugars, or salt (sodium).  Get regular exercise. This is one of the most important things that you can do for your health.  Try to exercise for at least 150 minutes each week. The type of exercise that you do should increase your heart rate and make you sweat. This is known as moderate-intensity exercise.  Try to do strengthening exercises at least twice each week. Do these in addition to the moderate-intensity exercise.  Know your numbers.Ask your health care provider to check your cholesterol and your blood glucose. Continue to have your blood tested as directed by your health care provider. What should I know about cancer screening? There are several types of cancer. Take the following steps to reduce your risk and to catch any cancer development as early as possible. Breast Cancer  Practice breast self-awareness.  This means understanding how your breasts normally appear and feel.  It also means doing regular breast self-exams. Let your health care provider know about any changes, no matter how small.  If you are 81 or older, have a clinician do a breast exam (clinical breast exam or CBE) every year. Depending on your age, family history, and medical history, it may be recommended that you also have a yearly breast X-ray (mammogram).  If you have a family history of breast cancer, talk with your health care provider about genetic screening.  If you are at high risk for breast cancer, talk with your health care provider about having an MRI and a mammogram every year.  Breast cancer (BRCA) gene test is recommended for women who have family members with BRCA-related cancers. Results of the assessment will determine the need for genetic counseling and BRCA1 and for BRCA2 testing. BRCA-related cancers include these types:  Breast. This occurs in males or females.  Ovarian.  Tubal. This may also be called fallopian tube cancer.  Cancer of the  abdominal or pelvic lining (peritoneal cancer).  Prostate.  Pancreatic. Cervical, Uterine, and Ovarian Cancer  Your health care provider may recommend that you be screened regularly for cancer of the pelvic organs. These include your ovaries, uterus, and vagina. This screening involves a pelvic exam, which includes checking for microscopic changes to the surface of your cervix (Pap test).  For women ages 21-65, health care providers may recommend a pelvic exam and a Pap test every three years. For women ages 24-65, they may recommend the Pap test and pelvic exam, combined with testing for human papilloma virus (HPV), every five years. Some types of HPV increase your risk of cervical cancer. Testing for HPV may also be done on women of any age who have unclear Pap test results.  Other health care providers may not recommend any screening for nonpregnant women who are considered low risk for pelvic cancer and have no symptoms. Ask your health care provider if a screening pelvic exam is right for you.  If you have had past treatment for cervical cancer or a condition that could lead to  cancer, you need Pap tests and screening for cancer for at least 20 years after your treatment. If Pap tests have been discontinued for you, your risk factors (such as having a new sexual partner) need to be reassessed to determine if you should start having screenings again. Some women have medical problems that increase the chance of getting cervical cancer. In these cases, your health care provider may recommend that you have screening and Pap tests more often.  If you have a family history of uterine cancer or ovarian cancer, talk with your health care provider about genetic screening.  If you have vaginal bleeding after reaching menopause, tell your health care provider.  There are currently no reliable tests available to screen for ovarian cancer. Lung Cancer  Lung cancer screening is recommended for adults  74-58 years old who are at high risk for lung cancer because of a history of smoking. A yearly low-dose CT scan of the lungs is recommended if you:  Currently smoke.  Have a history of at least 30 pack-years of smoking and you currently smoke or have quit within the past 15 years. A pack-year is smoking an average of one pack of cigarettes per day for one year. Yearly screening should:  Continue until it has been 15 years since you quit.  Stop if you develop a health problem that would prevent you from having lung cancer treatment. Colorectal Cancer  This type of cancer can be detected and can often be prevented.  Routine colorectal cancer screening usually begins at age 64 and continues through age 2.  If you have risk factors for colon cancer, your health care provider may recommend that you be screened at an earlier age.  If you have a family history of colorectal cancer, talk with your health care provider about genetic screening.  Your health care provider may also recommend using home test kits to check for hidden blood in your stool.  A small camera at the end of a tube can be used to examine your colon directly (sigmoidoscopy or colonoscopy). This is done to check for the earliest forms of colorectal cancer.  Direct examination of the colon should be repeated every 5-10 years until age 33. However, if early forms of precancerous polyps or small growths are found or if you have a family history or genetic risk for colorectal cancer, you may need to be screened more often. Skin Cancer  Check your skin from head to toe regularly.  Monitor any moles. Be sure to tell your health care provider:  About any new moles or changes in moles, especially if there is a change in a mole's shape or color.  If you have a mole that is larger than the size of a pencil eraser.  If any of your family members has a history of skin cancer, especially at a young age, talk with your health care  provider about genetic screening.  Always use sunscreen. Apply sunscreen liberally and repeatedly throughout the day.  Whenever you are outside, protect yourself by wearing long sleeves, pants, a wide-brimmed hat, and sunglasses. What should I know about osteoporosis? Osteoporosis is a condition in which bone destruction happens more quickly than new bone creation. After menopause, you may be at an increased risk for osteoporosis. To help prevent osteoporosis or the bone fractures that can happen because of osteoporosis, the following is recommended:  If you are 63-46 years old, get at least 1,000 mg of calcium and at least 600 mg  of vitamin D per day.  If you are older than age 39 but younger than age 70, get at least 1,200 mg of calcium and at least 600 mg of vitamin D per day.  If you are older than age 85, get at least 1,200 mg of calcium and at least 800 mg of vitamin D per day. Smoking and excessive alcohol intake increase the risk of osteoporosis. Eat foods that are rich in calcium and vitamin D, and do weight-bearing exercises several times each week as directed by your health care provider. What should I know about how menopause affects my mental health? Depression may occur at any age, but it is more common as you become older. Common symptoms of depression include:  Low or sad mood.  Changes in sleep patterns.  Changes in appetite or eating patterns.  Feeling an overall lack of motivation or enjoyment of activities that you previously enjoyed.  Frequent crying spells. Talk with your health care provider if you think that you are experiencing depression. What should I know about immunizations? It is important that you get and maintain your immunizations. These include:  Tetanus, diphtheria, and pertussis (Tdap) booster vaccine.  Influenza every year before the flu season begins.  Pneumonia vaccine.  Shingles vaccine. Your health care provider may also recommend other  immunizations. This information is not intended to replace advice given to you by your health care provider. Make sure you discuss any questions you have with your health care provider. Document Released: 11/10/2005 Document Revised: 04/07/2016 Document Reviewed: 06/22/2015 Elsevier Interactive Patient Education  2017 [place] Prevention in the Home Falls can cause injuries and can affect people from all age groups. There are many simple things that you can do to make your home safe and to help prevent falls. What can I do on the outside of my home?  Regularly repair the edges of walkways and driveways and fix any cracks.  Remove high doorway thresholds.  Trim any shrubbery on the main path into your home.  Use bright outdoor lighting.  Clear walkways of debris and clutter, including tools and rocks.  Regularly check that handrails are securely fastened and in good repair. Both sides of any steps should have handrails.  Install guardrails along the edges of any raised decks or porches.  Have leaves, snow, and ice cleared regularly.  Use sand or salt on walkways during winter months.  In the garage, clean up any spills right away, including grease or oil spills. What can I do in the bathroom?  Use night lights.  Install grab bars by the toilet and in the tub and shower. Do not use towel bars as grab bars.  Use non-skid mats or decals on the floor of the tub or shower.  If you need to sit down while you are in the shower, use a plastic, non-slip stool.  Keep the floor dry. Immediately clean up any water that spills on the floor.  Remove soap buildup in the tub or shower on a regular basis.  Attach bath mats securely with double-sided non-slip rug tape.  Remove throw rugs and other tripping hazards from the floor. What can I do in the bedroom?  Use night lights.  Make sure that a bedside light is easy to reach.  Do not use oversized bedding that drapes  onto the floor.  Have a firm chair that has side arms to use for getting dressed.  Remove throw rugs and other tripping hazards  from the floor. What can I do in the kitchen?  Clean up any spills right away.  Avoid walking on wet floors.  Place frequently used items in easy-to-reach places.  If you need to reach for something above you, use a sturdy step stool that has a grab bar.  Keep electrical cables out of the way.  Do not use floor polish or wax that makes floors slippery. If you have to use wax, make sure that it is non-skid floor wax.  Remove throw rugs and other tripping hazards from the floor. What can I do in the stairways?  Do not leave any items on the stairs.  Make sure that there are handrails on both sides of the stairs. Fix handrails that are broken or loose. Make sure that handrails are as long as the stairways.  Check any carpeting to make sure that it is firmly attached to the stairs. Fix any carpet that is loose or worn.  Avoid having throw rugs at the top or bottom of stairways, or secure the rugs with carpet tape to prevent them from moving.  Make sure that you have a light switch at the top of the stairs and the bottom of the stairs. If you do not have them, have them installed. What are some other fall prevention tips?  Wear closed-toe shoes that fit well and support your feet. Wear shoes that have rubber soles or low heels.  When you use a stepladder, make sure that it is completely opened and that the sides are firmly locked. Have someone hold the ladder while you are using it. Do not climb a closed stepladder.  Add color or contrast paint or tape to grab bars and handrails in your home. Place contrasting color strips on the first and last steps.  Use mobility aids as needed, such as canes, walkers, scooters, and crutches.  Turn on lights if it is dark. Replace any light bulbs that burn out.  Set up furniture so that there are clear paths. Keep the  furniture in the same spot.  Fix any uneven floor surfaces.  Choose a carpet design that does not hide the edge of steps of a stairway.  Be aware of any and all pets.  Review your medicines with your healthcare provider. Some medicines can cause dizziness or changes in blood pressure, which increase your risk of falling. Talk with your health care provider about other ways that you can decrease your risk of falls. This may include working with a physical therapist or trainer to improve your strength, balance, and endurance. This information is not intended to replace advice given to you by your health care provider. Make sure you discuss any questions you have with your health care provider. Document Released: 09/08/2002 Document Revised: 02/15/2016 Document Reviewed: 10/23/2014 Elsevier Interactive Patient Education  2017 [employer].

## 2017-01-10 ENCOUNTER — Ambulatory Visit (INDEPENDENT_AMBULATORY_CARE_PROVIDER_SITE_OTHER): Payer: Medicare Other | Admitting: Adult Health

## 2017-01-10 ENCOUNTER — Other Ambulatory Visit: Payer: Self-pay | Admitting: Adult Health

## 2017-01-10 ENCOUNTER — Encounter: Payer: Self-pay | Admitting: Adult Health

## 2017-01-10 VITALS — BP 142/64 | Temp 98.6°F | Ht 63.0 in | Wt 215.3 lb

## 2017-01-10 DIAGNOSIS — E785 Hyperlipidemia, unspecified: Secondary | ICD-10-CM

## 2017-01-10 DIAGNOSIS — F419 Anxiety disorder, unspecified: Secondary | ICD-10-CM

## 2017-01-10 DIAGNOSIS — E119 Type 2 diabetes mellitus without complications: Secondary | ICD-10-CM

## 2017-01-10 DIAGNOSIS — G4700 Insomnia, unspecified: Secondary | ICD-10-CM

## 2017-01-10 DIAGNOSIS — Z1159 Encounter for screening for other viral diseases: Secondary | ICD-10-CM | POA: Diagnosis not present

## 2017-01-10 DIAGNOSIS — F418 Other specified anxiety disorders: Secondary | ICD-10-CM

## 2017-01-10 DIAGNOSIS — I10 Essential (primary) hypertension: Secondary | ICD-10-CM

## 2017-01-10 DIAGNOSIS — F329 Major depressive disorder, single episode, unspecified: Secondary | ICD-10-CM

## 2017-01-10 LAB — BASIC METABOLIC PANEL
BUN: 12 mg/dL (ref 6–23)
CO2: 26 mEq/L (ref 19–32)
Calcium: 9.3 mg/dL (ref 8.4–10.5)
Chloride: 100 mEq/L (ref 96–112)
Creatinine, Ser: 0.65 mg/dL (ref 0.40–1.20)
GFR: 96.32 mL/min (ref >60.00)
GLUCOSE: 139 mg/dL — AB (ref 70–99)
POTASSIUM: 3.6 meq/L (ref 3.5–5.1)
SODIUM: 138 meq/L (ref 135–145)

## 2017-01-10 LAB — CBC WITH DIFFERENTIAL/PLATELET
Basophils Absolute: 0 10*3/uL (ref 0.0–0.1)
Basophils Relative: 0.4 % (ref 0.0–3.0)
Eosinophils Absolute: 0.2 10*3/uL (ref 0.0–0.7)
Eosinophils Relative: 2.7 % (ref 0.0–5.0)
HCT: 38.4 % (ref 36.0–46.0)
Hemoglobin: 12.6 g/dL (ref 12.0–15.0)
LYMPHS ABS: 2.4 10*3/uL (ref 0.7–4.0)
Lymphocytes Relative: 35.9 % (ref 12.0–46.0)
MCHC: 32.8 g/dL (ref 30.0–36.0)
MCV: 80.6 fl (ref 78.0–100.0)
MONOS PCT: 9 % (ref 3.0–12.0)
Monocytes Absolute: 0.6 10*3/uL (ref 0.1–1.0)
Neutro Abs: 3.4 10*3/uL (ref 1.4–7.7)
Neutrophils Relative %: 52 % (ref 43.0–77.0)
Platelets: 235 10*3/uL (ref 150.0–400.0)
RBC: 4.76 Mil/uL (ref 3.87–5.11)
RDW: 15.1 % (ref 11.5–15.5)
WBC: 6.6 10*3/uL (ref 4.0–10.5)

## 2017-01-10 LAB — POC URINALSYSI DIPSTICK (AUTOMATED)
Bilirubin, UA: NEGATIVE
GLUCOSE UA: NEGATIVE
Ketones, UA: NEGATIVE
Leukocytes, UA: NEGATIVE — AB
Nitrite, UA: NEGATIVE
Protein, UA: NEGATIVE
RBC UA: NEGATIVE
SPEC GRAV UA: 1.025 (ref 1.010–1.025)
Urobilinogen, UA: 0.2 E.U./dL
pH, UA: 6 (ref 5.0–8.0)

## 2017-01-10 LAB — LIPID PANEL
CHOLESTEROL: 141 mg/dL (ref 0–200)
HDL: 31.8 mg/dL — ABNORMAL LOW (ref >39.00)
Total CHOL/HDL Ratio: 4

## 2017-01-10 LAB — HEPATIC FUNCTION PANEL
ALBUMIN: 4 g/dL (ref 3.5–5.2)
ALT: 20 U/L (ref 0–35)
AST: 19 U/L (ref 0–37)
Alkaline Phosphatase: 70 U/L (ref 39–117)
Bilirubin, Direct: 0.1 mg/dL (ref 0.0–0.3)
Total Bilirubin: 0.4 mg/dL (ref 0.2–1.2)
Total Protein: 6.3 g/dL (ref 6.0–8.3)

## 2017-01-10 LAB — MICROALBUMIN / CREATININE URINE RATIO
Creatinine,U: 132 mg/dL
Microalb Creat Ratio: 0.5 mg/g (ref 0.0–30.0)
Microalb, Ur: <0.7 mg/dL (ref 0.0–1.9)

## 2017-01-10 LAB — LDL CHOLESTEROL, DIRECT: Direct LDL: 47 mg/dL

## 2017-01-10 LAB — TSH: TSH: 2.58 u[IU]/mL (ref 0.35–4.50)

## 2017-01-10 NOTE — Progress Notes (Signed)
Subjective:    Patient ID: Debra Wright, female    DOB: 1951/06/22, 66 y.o.   MRN: [id number]  HPI  Patient presents for yearly follow up exam. She is a pleasant 66 year old female who  has a past medical history of Anxiety; Arthritis; Basal cell carcinoma; Depression; Fibroid tumor; Frequent diarrhea; GERD (gastroesophageal reflux disease); High cholesterol; Hypertension; Insomnia; Migraine; Pernicious anemia; Type II diabetes mellitus ([place]); and Vitamin B 12 deficiency.  All immunizations and health maintenance protocols were reviewed with the patient and needed orders were placed.  Appropriate screening laboratory values were ordered for the patient including screening of hyperlipidemia, renal function and hepatic function.  Medication reconciliation,  past medical history, social history, problem list and allergies were reviewed in detail with the patient  Goals were established with regard to weight loss, exercise, and  diet in compliance with medications  She takes Metformin 1000mg  BID for diabetes. Her last A1c on 01/03/17 was 7.5 this was up from 6.7 in January  She takes pravastatin 20 mg daily for hyperlipidemia   She takes Norvasc 10 mg for hypertension   She feels as though her depression is controlled " for the most part" with Celexa   BP Readings from Last 3 Encounters:  01/10/17 (!) 142/64  01/03/17 124/60  10/31/16 130/80   She is up to date on her colonoscopy, mammogram, dental and vision screens. She does self breast exams and has not noticed    Review of Systems  Constitutional: Negative.   HENT: Negative.   Eyes: Negative.   Respiratory: Negative.   Cardiovascular: Negative.   Gastrointestinal: Negative.   Endocrine: Negative.   Genitourinary: Negative.   Musculoskeletal: Positive for arthralgias and back pain.  Skin: Negative.   Neurological: Negative.   Psychiatric/Behavioral: Negative.    Past Medical History:  Diagnosis Date  . Anxiety   .  Arthritis    "knees; right thumb" (10/14/2014)  . Basal cell carcinoma    "burned off upper lip & right shoulder"  . Depression   . Fibroid tumor   . Frequent diarrhea   . GERD (gastroesophageal reflux disease)   . High cholesterol   . Hypertension   . Insomnia   . Migraine    "frequently when I was younger; maybe monthly now" (10/14/2014)  . Pernicious anemia   . Type II diabetes mellitus ([place])   . Vitamin B 12 deficiency     Social History   Social History  . Marital status: Married    Spouse name: N/A  . Number of children: N/A  . Years of education: N/A   Occupational History  . Not on file.   Social History Main Topics  . Smoking status: Never Smoker  . Smokeless tobacco: Never Used  . Alcohol use Yes     Comment: 10/14/2014 "might have a drink when I'm out once/month"  . Drug use: No  . Sexual activity: Not Currently   Other Topics Concern  . 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 ABDOMINAL HYSTERECTOMY  1988  . ACHILLES TENDON SURGERY Right   . APPENDECTOMY  1988  . BILATERAL OOPHORECTOMY  2004  . CARPAL TUNNEL RELEASE Bilateral 1986  . CATARACT EXTRACTION W/ INTRAOCULAR LENS  IMPLANT, BILATERAL Bilateral 2011  . JOINT REPLACEMENT    .  SHOULDER ARTHROSCOPY DISTAL CLAVICLE EXCISION AND OPEN ROTATOR CUFF REPAIR Right 11/2013  . TONSILLECTOMY  1950's  . TOTAL KNEE ARTHROPLASTY Left 10/13/2014   Procedure: LEFT TOTAL KNEE ARTHROPLASTY;  Surgeon: Mcarthur Rossetti, MD;  Location: [facility];  Service: Orthopedics;  Laterality: Left;    Family History  Problem Relation Age of Onset  . Arthritis Mother   . Hyperlipidemia Mother   . Diabetes Mother   . Hypertension Mother   . Arthritis Father   . Hyperlipidemia Father   . Heart disease Father   . Diabetes Father   . Stroke Brother   . Hypertension Brother   . Diabetes Brother   .  Arthritis Maternal Grandmother   . Hyperlipidemia Maternal Grandmother   . Diabetes Maternal Grandmother   . Hypertension Maternal Grandmother   . Arthritis Maternal Grandfather   . Hyperlipidemia Maternal Grandfather   . Hypertension Maternal Grandfather   . Arthritis Paternal Grandmother   . Hyperlipidemia Paternal Grandmother   . Diabetes Paternal Grandmother   . Hypertension Paternal Grandmother   . Arthritis Paternal Grandfather   . Hyperlipidemia Paternal Grandfather   . Diabetes Paternal Grandfather   . Hypertension Paternal Grandfather     Allergies  Allergen Reactions  . Antivert [Meclizine Hcl] Other (See Comments)    "makes me feel like my skin is falling off".    Current Outpatient Prescriptions on File Prior to Visit  Medication Sig Dispense Refill  . amLODipine (NORVASC) 10 MG tablet Take 1 tablet (10 mg total) by mouth daily. 90 tablet 3  . calcium carbonate (TUMS - DOSED IN MG ELEMENTAL CALCIUM) 500 MG chewable tablet Chew 2 tablets by mouth daily as needed for indigestion or heartburn.    . Calcium Carbonate-Vitamin D (CALCIUM 600 + D PO) Take 1 tablet by mouth daily.    . citalopram (CELEXA) 40 MG tablet Take 1 tablet (40 mg total) by mouth every morning. 90 tablet 3  . diazepam (VALIUM) 5 MG tablet Take 1 tablet (5 mg total) by mouth every 6 (six) hours as needed (dizziness). 10 tablet 0  . Eszopiclone 3 MG TABS TAKE 1 TABLET BY MOUTH ONCE DAILY IMMEDIATELY BEDTIME 30 tablet 0  . ibuprofen (ADVIL,MOTRIN) 200 MG tablet Take 400 mg by mouth every 6 (six) hours as needed for moderate pain.    . metFORMIN (GLUCOPHAGE) 1000 MG tablet Take 1 tablet (1,000 mg total) by mouth 2 (two) times daily with a meal. 180 tablet 2  . omega-3 acid ethyl esters (LOVAZA) 1 G capsule Take 2 g by mouth 2 (two) times daily.    Marland Kitchen omeprazole (PRILOSEC) 40 MG capsule Take 1 capsule (40 mg total) by mouth every morning. 90 capsule 3  . pravastatin (PRAVACHOL) 20 MG tablet Take 1 tablet (20  mg total) by mouth at bedtime. 90 tablet 3   No current facility-administered medications on file prior to visit.     BP (!) 142/64 (BP Location: Left Arm, Patient Position: Sitting, Cuff Size: Normal)   Temp 98.6 F (37 C) (Oral)   Ht 5\' 3"  (1.6 m)   Wt 215 lb 4.8 oz (97.7 kg)   BMI 38.14 kg/m       Objective:   Physical Exam  Constitutional: She is oriented to person, place, and time. She appears well-developed and well-nourished. No distress.  obese  HENT:  Head: Normocephalic and atraumatic.  Right Ear: External ear normal.  Left Ear: External ear normal.  Nose: Nose normal.  Mouth/Throat: Oropharynx  is clear and moist. No oropharyngeal exudate.  Eyes: Conjunctivae and EOM are normal. Pupils are equal, round, and reactive to light. Right eye exhibits no discharge. Left eye exhibits no discharge. No scleral icterus.  Neck: Normal range of motion. Neck supple. No JVD present. Carotid bruit is not present. No thyroid mass and no thyromegaly present.  Cardiovascular: Normal rate, regular rhythm, normal heart sounds and intact distal pulses.  Exam reveals no gallop and no friction rub.   No murmur heard. Pulmonary/Chest: Effort normal and breath sounds normal. No respiratory distress. She has no wheezes. She has no rales. She exhibits no tenderness.  Abdominal: Soft. Bowel sounds are normal. She exhibits no distension and no mass. There is no tenderness. There is no rebound and no guarding.  Genitourinary:  Genitourinary Comments: deferred  Musculoskeletal: Normal range of motion. She exhibits no edema, tenderness or deformity.  Lymphadenopathy:    She has no cervical adenopathy.  Neurological: She is alert and oriented to person, place, and time. She has normal reflexes. She displays normal reflexes. No cranial nerve deficit. She exhibits normal muscle tone. Coordination normal.  Skin: Skin is warm and dry. No rash noted. She is not diaphoretic. No erythema. No pallor.    Psychiatric: She has a normal mood and affect. Her behavior is normal. Judgment and thought content normal.  Nursing note and vitals reviewed.     Assessment & Plan:  1. Essential hypertension - She did not take her medication this morning.  - Controlled in the past.  - Basic metabolic panel - CBC with Differential/Platelet - Hepatic function panel - Lipid panel - TSH - POCT Urinalysis Dipstick (Automated) - Microalbumin / creatinine urine ratio  2. Controlled type 2 diabetes mellitus without complication, without long-term current use of insulin (HCC) - Basic metabolic panel - CBC with Differential/Platelet - Hepatic function panel - Lipid panel - TSH - POCT Urinalysis Dipstick (Automated) - Microalbumin / creatinine urine ratio - Will retest A1c in three months . If A1c continues to be high then will add agent  3. Anxiety and depression - Continue with current dose.  - I would like her to follow up with psychiatry as needed  4. Hyperlipidemia, unspecified hyperlipidemia type  - Basic metabolic panel - CBC with Differential/Platelet - Hepatic function panel - Lipid panel - TSH - POCT Urinalysis Dipstick (Automated) - Microalbumin / creatinine urine ratio - Consider increasing statin  5. Need for hepatitis C screening test  - Hep C Antibody  Dorothyann Peng, NP

## 2017-01-11 ENCOUNTER — Telehealth: Payer: Self-pay | Admitting: Adult Health

## 2017-01-11 LAB — HEPATITIS C ANTIBODY: HCV Ab: NEGATIVE

## 2017-01-11 MED ORDER — ATORVASTATIN CALCIUM 40 MG PO TABS: 40.0000 mg | ORAL_TABLET | Freq: Every day | ORAL | 3 refills | Status: DC

## 2017-01-11 NOTE — Telephone Encounter (Signed)
Ok to refill 

## 2017-01-11 NOTE — Telephone Encounter (Signed)
Spoke to Diane and informed her of her labs. Her triglyceride leve is > 500. I am going to switch her statin to Lipitor and she is going to work on diet and exercise.   I would like to retest in 6 month

## 2017-01-11 NOTE — Telephone Encounter (Signed)
Rx called in as directed.   

## 2017-01-17 ENCOUNTER — Ambulatory Visit
Admission: RE | Admit: 2017-01-17 | Discharge: 2017-01-17 | Disposition: A | Discharge status: Home / Self Care | Payer: Medicare Other | Source: Ambulatory Visit | Attending: Neurosurgery | Admitting: Neurosurgery

## 2017-01-17 DIAGNOSIS — M4692 Unspecified inflammatory spondylopathy, cervical region: Secondary | ICD-10-CM

## 2017-01-17 DIAGNOSIS — M5412 Radiculopathy, cervical region: Principal | ICD-10-CM

## 2017-01-17 DIAGNOSIS — M5440 Lumbago with sciatica, unspecified side: Secondary | ICD-10-CM

## 2017-01-17 DIAGNOSIS — M48061 Spinal stenosis, lumbar region without neurogenic claudication: Secondary | ICD-10-CM | POA: Diagnosis not present

## 2017-01-23 ENCOUNTER — Ambulatory Visit (INDEPENDENT_AMBULATORY_CARE_PROVIDER_SITE_OTHER): Payer: Medicare Other | Admitting: Adult Health

## 2017-01-23 ENCOUNTER — Encounter: Payer: Self-pay | Admitting: Adult Health

## 2017-01-23 VITALS — BP 124/62 | Temp 98.5°F

## 2017-01-23 DIAGNOSIS — N3001 Acute cystitis with hematuria: Secondary | ICD-10-CM

## 2017-01-23 LAB — POCT URINALYSIS DIPSTICK
Bilirubin, UA: NEGATIVE
Glucose, UA: NEGATIVE
Ketones, UA: NEGATIVE
NITRITE UA: NEGATIVE
PH UA: 6 (ref 5.0–8.0)
PROTEIN UA: NEGATIVE
Spec Grav, UA: 1.01 (ref 1.010–1.025)
UROBILINOGEN UA: 0.2 U/dL

## 2017-01-23 MED ORDER — SULFAMETHOXAZOLE-TRIMETHOPRIM 800-160 MG PO TABS
1.0000 | ORAL_TABLET | Freq: Two times a day (BID) | ORAL | 0 refills | Status: AC
Start: 2017-01-23 — End: 2017-01-26

## 2017-01-23 NOTE — Progress Notes (Signed)
Subjective:    Patient ID: Debra Wright, female    DOB: [birth year], 66 y.o.   MRN: [id number]  HPI  66 year old female who  has a past medical history of Anxiety; Arthritis; Basal cell carcinoma; Depression; Fibroid tumor; Frequent diarrhea; GERD (gastroesophageal reflux disease); High cholesterol; Hypertension; Insomnia; Migraine; Pernicious anemia; Type II diabetes mellitus ([place]); and Vitamin B 12 deficiency.  She presents to the office with the acute complaint of "bladder spasms". Her symptoms have been present for 1-2 weeks. Her symptoms include that of frequency/urgency/ pelvic pain/ and feeling as though her bladder is spasming   She denies dysuria, hematuria, or dysuria.   Review of Systems See HPI   Past Medical History:  Diagnosis Date  . Anxiety   . Arthritis    "knees; right thumb" (10/14/2014)  . Basal cell carcinoma    "burned off upper lip & right shoulder"  . Depression   . Fibroid tumor   . Frequent diarrhea   . GERD (gastroesophageal reflux disease)   . High cholesterol   . Hypertension   . Insomnia   . Migraine    "frequently when I was younger; maybe monthly now" (10/14/2014)  . Pernicious anemia   . Type II diabetes mellitus ([place])   . Vitamin B 12 deficiency     Social History   Social History  . Marital status: Married    Spouse name: N/A  . Number of children: N/A  . Years of education: N/A   Occupational History  . Not on file.   Social History Main Topics  . Smoking status: Never Smoker  . Smokeless tobacco: Never Used  . Alcohol use Yes     Comment: 10/14/2014 "might have a drink when I'm out once/month"  . Drug use: No  . Sexual activity: Not Currently   Other Topics Concern  . 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 ABDOMINAL HYSTERECTOMY  1988  . ACHILLES TENDON SURGERY Right   . APPENDECTOMY  1988  . BILATERAL  OOPHORECTOMY  2004  . CARPAL TUNNEL RELEASE Bilateral 1986  . CATARACT EXTRACTION W/ INTRAOCULAR LENS  IMPLANT, BILATERAL Bilateral 2011  . JOINT REPLACEMENT    . SHOULDER ARTHROSCOPY DISTAL CLAVICLE EXCISION AND OPEN ROTATOR CUFF REPAIR Right 11/2013  . TONSILLECTOMY  1950's  . TOTAL KNEE ARTHROPLASTY Left 10/13/2014   Procedure: LEFT TOTAL KNEE ARTHROPLASTY;  Surgeon: Mcarthur Rossetti, MD;  Location: [facility];  Service: Orthopedics;  Laterality: Left;    Family History  Problem Relation Age of Onset  . Arthritis Mother   . Hyperlipidemia Mother   . Diabetes Mother   . Hypertension Mother   . Arthritis Father   . Hyperlipidemia Father   . Heart disease Father   . Diabetes Father   . Stroke Brother   . Hypertension Brother   . Diabetes Brother   . Arthritis Maternal Grandmother   . Hyperlipidemia Maternal Grandmother   . Diabetes Maternal Grandmother   . Hypertension Maternal Grandmother   . Arthritis Maternal Grandfather   . Hyperlipidemia Maternal Grandfather   . Hypertension Maternal Grandfather   . Arthritis Paternal Grandmother   . Hyperlipidemia Paternal Grandmother   . Diabetes Paternal Grandmother   . Hypertension Paternal Grandmother   . Arthritis Paternal Grandfather   .  Hyperlipidemia Paternal Grandfather   . Diabetes Paternal Grandfather   . Hypertension Paternal Grandfather     Allergies  Allergen Reactions  . Antivert [Meclizine Hcl] Other (See Comments)    "makes me feel like my skin is falling off".    Current Outpatient Prescriptions on File Prior to Visit  Medication Sig Dispense Refill  . amLODipine (NORVASC) 10 MG tablet Take 1 tablet (10 mg total) by mouth daily. 90 tablet 3  . atorvastatin (LIPITOR) 40 MG tablet Take 1 tablet (40 mg total) by mouth daily. 90 tablet 3  . calcium carbonate (TUMS - DOSED IN MG ELEMENTAL CALCIUM) 500 MG chewable tablet Chew 2 tablets by mouth daily as needed for indigestion or heartburn.    . Calcium  Carbonate-Vitamin D (CALCIUM 600 + D PO) Take 1 tablet by mouth daily.    . citalopram (CELEXA) 40 MG tablet Take 1 tablet (40 mg total) by mouth every morning. 90 tablet 3  . Eszopiclone 3 MG TABS TAKE 1 TABLET BY MOUTH ONCE DAILY AT BEDTIME 30 tablet 0  . ibuprofen (ADVIL,MOTRIN) 200 MG tablet Take 400 mg by mouth every 6 (six) hours as needed for moderate pain.    . metFORMIN (GLUCOPHAGE) 1000 MG tablet Take 1 tablet (1,000 mg total) by mouth 2 (two) times daily with a meal. 180 tablet 2  . omega-3 acid ethyl esters (LOVAZA) 1 G capsule Take 2 g by mouth 2 (two) times daily.    Marland Kitchen omeprazole (PRILOSEC) 40 MG capsule Take 1 capsule (40 mg total) by mouth every morning. 90 capsule 3   No current facility-administered medications on file prior to visit.     BP 124/62 (BP Location: Left Arm, Patient Position: Sitting, Cuff Size: Normal)   Temp 98.5 F (36.9 C) (Oral)       Objective:   Physical Exam  Constitutional: She is oriented to person, place, and time. She appears well-developed and well-nourished. No distress.  Cardiovascular: Normal rate, regular rhythm, normal heart sounds and intact distal pulses.  Exam reveals no gallop and no friction rub.   No murmur heard. Pulmonary/Chest: Effort normal and breath sounds normal. No respiratory distress. She has no wheezes. She has no rales. She exhibits no tenderness.  Abdominal: Soft. Normal appearance and bowel sounds are normal. She exhibits no distension and no mass. There is no tenderness. There is no rebound, no guarding and no CVA tenderness.  Neurological: She is alert and oriented to person, place, and time.  Skin: Skin is warm and dry. No rash noted. No erythema. No pallor.  Psychiatric: She has a normal mood and affect. Her behavior is normal. Judgment and thought content normal.  Nursing note and vitals reviewed.     Assessment & Plan:  1. Acute cystitis with hematuria - POCT urinalysis dipstick - + Leuks and Blood  -  sulfamethoxazole-trimethoprim (BACTRIM DS,SEPTRA DS) 800-160 MG tablet; Take 1 tablet by mouth 2 (two) times daily.  Dispense: 6 tablet; Refill: 0 - Culture, Urine - Stay hydrated - Can add OTC Azo   Dorothyann Peng, NP

## 2017-01-24 LAB — URINE CULTURE

## 2017-01-25 ENCOUNTER — Ambulatory Visit (INDEPENDENT_AMBULATORY_CARE_PROVIDER_SITE_OTHER): Payer: Medicare Other | Admitting: Physician Assistant

## 2017-02-03 ENCOUNTER — Other Ambulatory Visit: Payer: Self-pay | Admitting: Adult Health

## 2017-02-03 DIAGNOSIS — G4700 Insomnia, unspecified: Secondary | ICD-10-CM

## 2017-02-05 NOTE — Telephone Encounter (Signed)
Ok to refill for 30 days  

## 2017-02-05 NOTE — Telephone Encounter (Signed)
Last rx given on 4/12 for #30 with no ref

## 2017-02-06 NOTE — Telephone Encounter (Signed)
Rx called in as directed.   

## 2017-02-09 DIAGNOSIS — M5440 Lumbago with sciatica, unspecified side: Secondary | ICD-10-CM | POA: Diagnosis not present

## 2017-02-09 DIAGNOSIS — M5136 Other intervertebral disc degeneration, lumbar region: Secondary | ICD-10-CM | POA: Diagnosis not present

## 2017-02-09 DIAGNOSIS — M5412 Radiculopathy, cervical region: Secondary | ICD-10-CM | POA: Diagnosis not present

## 2017-02-09 DIAGNOSIS — M5030 Other cervical disc degeneration, unspecified cervical region: Secondary | ICD-10-CM | POA: Diagnosis not present

## 2017-02-09 DIAGNOSIS — M4300 Spondylolysis, site unspecified: Secondary | ICD-10-CM | POA: Diagnosis not present

## 2017-02-09 DIAGNOSIS — R29898 Other symptoms and signs involving the musculoskeletal system: Secondary | ICD-10-CM | POA: Diagnosis not present

## 2017-02-09 DIAGNOSIS — M47816 Spondylosis without myelopathy or radiculopathy, lumbar region: Secondary | ICD-10-CM | POA: Diagnosis not present

## 2017-02-09 DIAGNOSIS — M4722 Other spondylosis with radiculopathy, cervical region: Secondary | ICD-10-CM | POA: Diagnosis not present

## 2017-02-09 DIAGNOSIS — M5416 Radiculopathy, lumbar region: Secondary | ICD-10-CM | POA: Diagnosis not present

## 2017-02-09 DIAGNOSIS — D492 Neoplasm of unspecified behavior of bone, soft tissue, and skin: Secondary | ICD-10-CM | POA: Diagnosis not present

## 2017-02-09 DIAGNOSIS — M4310 Spondylolisthesis, site unspecified: Secondary | ICD-10-CM | POA: Diagnosis not present

## 2017-02-09 DIAGNOSIS — M48062 Spinal stenosis, lumbar region with neurogenic claudication: Secondary | ICD-10-CM | POA: Diagnosis not present

## 2017-02-13 ENCOUNTER — Encounter: Payer: Self-pay | Admitting: Pulmonary Disease

## 2017-02-13 ENCOUNTER — Other Ambulatory Visit: Payer: Self-pay | Admitting: Neurosurgery

## 2017-02-13 ENCOUNTER — Ambulatory Visit (INDEPENDENT_AMBULATORY_CARE_PROVIDER_SITE_OTHER): Payer: Medicare Other | Admitting: Pulmonary Disease

## 2017-02-13 VITALS — BP 118/70 | HR 75 | Resp 16 | Ht 62.0 in | Wt 209.6 lb

## 2017-02-13 DIAGNOSIS — G4733 Obstructive sleep apnea (adult) (pediatric): Secondary | ICD-10-CM

## 2017-02-13 DIAGNOSIS — F5101 Primary insomnia: Secondary | ICD-10-CM

## 2017-02-13 DIAGNOSIS — M4722 Other spondylosis with radiculopathy, cervical region: Secondary | ICD-10-CM

## 2017-02-13 DIAGNOSIS — D492 Neoplasm of unspecified behavior of bone, soft tissue, and skin: Secondary | ICD-10-CM

## 2017-02-13 NOTE — Assessment & Plan Note (Signed)
Given excessive daytime somnolence, narrow pharyngeal exam, witnessed apneas & loud snoring, obstructive sleep apnea is very likely & an overnight polysomnogram will be scheduled as a home study. The pathophysiology of obstructive sleep apnea , it's cardiovascular consequences & modes of treatment including CPAP were discused with the patient in detail & they evidenced understanding.  Pretest probability is intermediate. If home study is negative may still need an attended polysomnogram due to her variable sleep habits

## 2017-02-13 NOTE — Patient Instructions (Signed)
- 

## 2017-02-13 NOTE — Progress Notes (Signed)
Subjective:    Patient ID: Debra Wright, female    DOB: [birth year], 66 y.o.   MRN: [id number]  HPI  Chief Complaint  Patient presents with  . 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 Her husband has OSA and is on a CPAP machine. He has witnessed apneas when he saw her sleeping in a recliner. She reports frequent nocturnal awakenings and non-refreshing sleep. She has excessive daytime somnolence and feels tired all the time. She has always had sleep onset insomnia for which she has taken multiple medications over the past few years including Ambien, Benadryl, Tylenol PM and melatonin. For the last 4 years she has been maintained on Lunesta 3 mg. Epworth sleepiness score is 7. Bedtime is between 11 PM and midnight, sleep latency is variable from 15-30 minutes and occasionally about once every 2 weeks this is longer than 1-2 hours. She reports 4-5 nocturnal awakenings including nocturia, sleeps on her right side with one pillow and because she is retired and there is no pressure to wake up in the mornings often stays in bed until 10 AM but wakes up still feeling tired with occasional dryness of mouth. She has lost about 25 pounds in the last 2 years.  There is no history suggestive of cataplexy, sleep paralysis or parasomnias She reports chronic back pain due to which she occasionally sleeps in a recliner Diabetes and hypertension are well controlled on one medication each     Past Medical History:  Diagnosis Date  . Anxiety   . Arthritis    "knees; right thumb" (10/14/2014)  . Basal cell carcinoma    "burned off upper lip & right shoulder"  . Depression   . Fibroid tumor   . Frequent diarrhea   . GERD (gastroesophageal reflux disease)   . High cholesterol   . Hypertension   .  Insomnia   . Migraine    "frequently when I was younger; maybe monthly now" (10/14/2014)  . Pernicious anemia   . Type II diabetes mellitus ([place])   . Vitamin B 12 deficiency    Past Surgical History:  Procedure Laterality Date  . ABDOMINAL HYSTERECTOMY  1988  . ACHILLES TENDON SURGERY Right   . APPENDECTOMY  1988  . BILATERAL OOPHORECTOMY  2004  . CARPAL TUNNEL RELEASE Bilateral 1986  . CATARACT EXTRACTION W/ INTRAOCULAR LENS  IMPLANT, BILATERAL Bilateral 2011  . JOINT REPLACEMENT    . SHOULDER ARTHROSCOPY DISTAL CLAVICLE EXCISION AND OPEN ROTATOR CUFF REPAIR Right 11/2013  . TONSILLECTOMY  1950's  . TOTAL KNEE ARTHROPLASTY Left 10/13/2014   Procedure: LEFT TOTAL KNEE ARTHROPLASTY;  Surgeon: Mcarthur Rossetti, MD;  Location: [facility];  Service: Orthopedics;  Laterality: Left;    Allergies  Allergen Reactions  . Antivert [Meclizine Hcl] Other (See Comments)    "makes me feel like my skin is falling off".    Social History   Social History  . Marital status: Married    Spouse name: N/A  . Number of children: N/A  . Years of education: N/A   Occupational History  . Not on file.   Social History Main Topics  . Smoking status: Never Smoker  . Smokeless tobacco: Never Used  . Alcohol use Yes     Comment: 10/14/2014 "might have a drink when I'm  out once/month"  . Drug use: No  . Sexual activity: Not Currently   Other Topics Concern  . 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 Arthritis Mother   . Hyperlipidemia Mother   . Diabetes Mother   . Hypertension Mother   . Arthritis Father   . Hyperlipidemia Father   . Heart disease Father   . Diabetes Father   . Stroke Brother   . Hypertension Brother   . Diabetes Brother   . Arthritis Maternal Grandmother   . Hyperlipidemia Maternal Grandmother   . Diabetes Maternal Grandmother   . Hypertension  Maternal Grandmother   . Arthritis Maternal Grandfather   . Hyperlipidemia Maternal Grandfather   . Hypertension Maternal Grandfather   . Arthritis Paternal Grandmother   . Hyperlipidemia Paternal Grandmother   . Diabetes Paternal Grandmother   . Hypertension Paternal Grandmother   . Arthritis Paternal Grandfather   . Hyperlipidemia Paternal Grandfather   . Diabetes Paternal Grandfather   . Hypertension Paternal Grandfather      Review of Systems  Respiratory: Positive for apnea and choking.    Constitutional: negative for anorexia, fevers and sweats  Eyes: negative for irritation, redness and visual disturbance  Ears, nose, mouth, throat, and face: negative for earaches, epistaxis, nasal congestion and sore throat  Respiratory: negative for cough, dyspnea on exertion, sputum and wheezing  Cardiovascular: negative for chest pain, dyspnea, lower extremity edema, orthopnea, palpitations and syncope  Gastrointestinal: negative for abdominal pain, constipation, diarrhea, melena, nausea and vomiting  Genitourinary:negative for dysuria, frequency and hematuria  Hematologic/lymphatic: negative for bleeding, easy bruising and lymphadenopathy  Musculoskeletal:negative for arthralgias, muscle weakness and stiff joints  Neurological: negative for coordination problems, gait problems, headaches and weakness  Endocrine: negative for diabetic symptoms including polydipsia, polyuria and weight loss     Objective:   Physical Exam   Gen. Pleasant, obese, in no distress, normal affect ENT - no lesions, no post nasal drip, class 2-3 airway Neck: No JVD, no thyromegaly, no carotid bruits Lungs: no use of accessory muscles, no dullness to percussion, decreased without rales or rhonchi  Cardiovascular: Rhythm regular, heart sounds  normal, no murmurs or gallops, no peripheral edema Abdomen: soft and non-tender, no hepatosplenomegaly, BS normal. Musculoskeletal: No deformities, no cyanosis or  clubbing Neuro:  alert, non focal, no tremors        Assessment & Plan:

## 2017-02-13 NOTE — Assessment & Plan Note (Signed)
We'll perform sleep study on 3 mg of Lunesta which is her baseline since she has been on this dose for 4 years

## 2017-02-15 DIAGNOSIS — M48061 Spinal stenosis, lumbar region without neurogenic claudication: Secondary | ICD-10-CM | POA: Diagnosis not present

## 2017-02-15 DIAGNOSIS — M4726 Other spondylosis with radiculopathy, lumbar region: Secondary | ICD-10-CM | POA: Diagnosis not present

## 2017-02-15 DIAGNOSIS — M5136 Other intervertebral disc degeneration, lumbar region: Secondary | ICD-10-CM | POA: Diagnosis not present

## 2017-02-28 ENCOUNTER — Telehealth: Payer: Self-pay | Admitting: Pulmonary Disease

## 2017-02-28 NOTE — Telephone Encounter (Signed)
Debra Wright is schedule to pick up HST machine on 03/02/2017 @ 3:00pm

## 2017-03-01 ENCOUNTER — Ambulatory Visit
Admission: RE | Admit: 2017-03-01 | Discharge: 2017-03-01 | Disposition: A | Discharge status: Home / Self Care | Payer: Medicare Other | Source: Ambulatory Visit | Attending: Neurosurgery | Admitting: Neurosurgery

## 2017-03-01 DIAGNOSIS — M4804 Spinal stenosis, thoracic region: Secondary | ICD-10-CM | POA: Diagnosis not present

## 2017-03-01 DIAGNOSIS — M50221 Other cervical disc displacement at C4-C5 level: Secondary | ICD-10-CM | POA: Diagnosis not present

## 2017-03-01 DIAGNOSIS — D492 Neoplasm of unspecified behavior of bone, soft tissue, and skin: Secondary | ICD-10-CM

## 2017-03-01 DIAGNOSIS — M4722 Other spondylosis with radiculopathy, cervical region: Secondary | ICD-10-CM

## 2017-03-01 MED ORDER — GADOBENATE DIMEGLUMINE 529 MG/ML IV SOLN
19.0000 mL | Freq: Once | INTRAVENOUS | Status: AC | PRN
  Administered 2017-03-01: 19 mL via INTRAVENOUS

## 2017-03-04 DIAGNOSIS — G4733 Obstructive sleep apnea (adult) (pediatric): Secondary | ICD-10-CM | POA: Diagnosis not present

## 2017-03-06 ENCOUNTER — Telehealth: Payer: Self-pay | Admitting: Pulmonary Disease

## 2017-03-06 DIAGNOSIS — G4733 Obstructive sleep apnea (adult) (pediatric): Secondary | ICD-10-CM

## 2017-03-06 NOTE — Telephone Encounter (Signed)
Per RA, HST showed severe OSA with 35 events per hour. Suggests a CPAP titration.

## 2017-03-06 NOTE — Telephone Encounter (Signed)
Spoke with patient regarding results. She verbalized understanding. Wants to proceed with CPAP titration study.

## 2017-03-07 ENCOUNTER — Other Ambulatory Visit: Payer: Self-pay | Admitting: *Deleted

## 2017-03-07 DIAGNOSIS — G4733 Obstructive sleep apnea (adult) (pediatric): Secondary | ICD-10-CM

## 2017-03-08 ENCOUNTER — Other Ambulatory Visit: Payer: Self-pay | Admitting: Adult Health

## 2017-03-08 DIAGNOSIS — G4700 Insomnia, unspecified: Secondary | ICD-10-CM

## 2017-03-09 NOTE — Telephone Encounter (Signed)
Ok to refill for 30 days  

## 2017-03-09 NOTE — Telephone Encounter (Signed)
Pt is requesting a refill on: Eszopiclone 3 mg take one tablet PO QHS  Last OV: 01/23/17 Scheduled appt on: 04/11/17 Last refilled: 02/06/17 #30 tabs with 0 refills  Please advise

## 2017-03-15 DIAGNOSIS — M4722 Other spondylosis with radiculopathy, cervical region: Secondary | ICD-10-CM | POA: Diagnosis not present

## 2017-03-15 DIAGNOSIS — D492 Neoplasm of unspecified behavior of bone, soft tissue, and skin: Secondary | ICD-10-CM | POA: Diagnosis not present

## 2017-03-15 DIAGNOSIS — Z6838 Body mass index (BMI) 38.0-38.9, adult: Secondary | ICD-10-CM | POA: Diagnosis not present

## 2017-03-15 DIAGNOSIS — I10 Essential (primary) hypertension: Secondary | ICD-10-CM | POA: Diagnosis not present

## 2017-03-15 DIAGNOSIS — M75102 Unspecified rotator cuff tear or rupture of left shoulder, not specified as traumatic: Secondary | ICD-10-CM | POA: Diagnosis not present

## 2017-03-15 DIAGNOSIS — R29898 Other symptoms and signs involving the musculoskeletal system: Secondary | ICD-10-CM | POA: Diagnosis not present

## 2017-03-16 ENCOUNTER — Other Ambulatory Visit: Payer: Self-pay | Admitting: Neurosurgery

## 2017-03-16 DIAGNOSIS — D492 Neoplasm of unspecified behavior of bone, soft tissue, and skin: Secondary | ICD-10-CM

## 2017-03-16 DIAGNOSIS — M75102 Unspecified rotator cuff tear or rupture of left shoulder, not specified as traumatic: Secondary | ICD-10-CM

## 2017-03-29 ENCOUNTER — Ambulatory Visit
Admission: RE | Admit: 2017-03-29 | Discharge: 2017-03-29 | Disposition: A | Discharge status: Home / Self Care | Payer: Medicare Other | Source: Ambulatory Visit | Attending: Neurosurgery | Admitting: Neurosurgery

## 2017-03-29 DIAGNOSIS — M75102 Unspecified rotator cuff tear or rupture of left shoulder, not specified as traumatic: Secondary | ICD-10-CM

## 2017-03-29 DIAGNOSIS — M7989 Other specified soft tissue disorders: Secondary | ICD-10-CM | POA: Diagnosis not present

## 2017-04-09 ENCOUNTER — Other Ambulatory Visit: Payer: Self-pay | Admitting: Adult Health

## 2017-04-09 DIAGNOSIS — G4700 Insomnia, unspecified: Secondary | ICD-10-CM

## 2017-04-11 ENCOUNTER — Ambulatory Visit: Payer: Medicare Other | Admitting: Adult Health

## 2017-04-12 ENCOUNTER — Ambulatory Visit (HOSPITAL_BASED_OUTPATIENT_CLINIC_OR_DEPARTMENT_OTHER): Payer: Medicare Other | Attending: Pulmonary Disease | Admitting: Pulmonary Disease

## 2017-04-12 DIAGNOSIS — G4733 Obstructive sleep apnea (adult) (pediatric): Secondary | ICD-10-CM | POA: Diagnosis not present

## 2017-04-13 ENCOUNTER — Telehealth: Payer: Self-pay | Admitting: Pulmonary Disease

## 2017-04-13 DIAGNOSIS — G4733 Obstructive sleep apnea (adult) (pediatric): Secondary | ICD-10-CM

## 2017-04-13 DIAGNOSIS — G4730 Sleep apnea, unspecified: Secondary | ICD-10-CM | POA: Diagnosis not present

## 2017-04-13 NOTE — Procedures (Signed)
Patient Name: Debra Wright, Debra Wright Date: 04/12/2017 Gender: Female D.O.B: 30-Aug-1951 Age (years): 47 Referring Provider: Kara Mead MD, ABSM Height (inches): 62 Interpreting Physician: Kara Mead MD, ABSM Weight (lbs): 208 RPSGT: Laren Everts BMI: 38 MRN: [id number] Neck Size: 16.00   CLINICAL INFORMATION The patient is referred for a CPAP titration to treat sleep apnea.  Date of  HST: 03/2017 , severe OSA, AHI 35/h  SLEEP STUDY TECHNIQUE As per the AASM Manual for the Scoring of Sleep and Associated Events v2.3 (April 2016) with a hypopnea requiring 4% desaturations.  The channels recorded and monitored were frontal, central and occipital EEG, electrooculogram (EOG), submentalis EMG (chin), nasal and oral airflow, thoracic and abdominal wall motion, anterior tibialis EMG, snore microphone, electrocardiogram, and pulse oximetry. Continuous positive airway pressure (CPAP) was initiated at the beginning of the study and titrated to treat sleep-disordered breathing.  MEDICATIONS Medications self-administered by patient taken the night of the study : [place], [place] Comments added by technician: Patient had difficulty initiating sleep.   RESPIRATORY PARAMETERS Optimal PAP Pressure (cm): 17 AHI at Optimal Pressure (/hr): 3.6 Overall Minimal O2 (%): 80.00 Supine % at Optimal Pressure (%): 15 Minimal O2 at Optimal Pressure (%): 80.0     SLEEP ARCHITECTURE The study was initiated at 10:33:19 PM and ended at 5:01:28 AM.  Sleep onset time was 53.1 minutes and the sleep efficiency was 67.6%. The total sleep time was 262.5 minutes.  The patient spent 8.19% of the night in stage N1 sleep, 81.52% in stage N2 sleep, 0.00% in stage N3 and 10.29% in REM.Stage REM latency was 157.5 minutes  Wake after sleep onset was 72.6. Alpha intrusion was absent. Supine sleep was 18.67%.  CARDIAC DATA The 2 lead EKG demonstrated sinus rhythm. The mean heart rate was 59.03 beats  per minute. Other EKG findings include: PVCs.   LEG MOVEMENT DATA The total Periodic Limb Movements of Sleep (PLMS) were 42. The PLMS index was 9.60. A PLMS index of <15 is considered normal in adults.  IMPRESSIONS - The optimal PAP pressure was 17 cm of water. - Central sleep apnea was not noted during this titration (CAI = 0.5/h). - Severe oxygen desaturations were observed during this titration (min O2 = 80.00%). - The patient snored with Moderate snoring volume during this titration study. - 2-lead EKG demonstrated: PVCs - Mild periodic limb movements were observed during this study. Arousals associated with PLMs were rare.   DIAGNOSIS - Obstructive Sleep Apnea (327.23 [G47.33 ICD-10])   RECOMMENDATIONS - Trial of CPAP therapy on 17 cm H2O with a Small size Philips Respironics Full Face Mask Amara View mask and heated humidification. - Avoid alcohol, sedatives and other CNS depressants that may worsen sleep apnea and disrupt normal sleep architecture. - Sleep hygiene should be reviewed to assess factors that may improve sleep quality. - Weight management and regular exercise should be initiated or continued. - Return to [facility] for re-evaluation after 4 weeks of therapy   Kara Mead MD Board Certified in [place]

## 2017-04-13 NOTE — Telephone Encounter (Signed)
Pl let pt know & send rx to DME for   Auto CPAP 10-18 cm H2O with a Small size Philips Respironics Full Face Mask Amara View mask and heated humidification DL in 4 wks OV with TP/ me in 6 wks

## 2017-04-16 ENCOUNTER — Telehealth: Payer: Self-pay | Admitting: Adult Health

## 2017-04-16 DIAGNOSIS — M5030 Other cervical disc degeneration, unspecified cervical region: Secondary | ICD-10-CM | POA: Diagnosis not present

## 2017-04-16 DIAGNOSIS — M4722 Other spondylosis with radiculopathy, cervical region: Secondary | ICD-10-CM | POA: Diagnosis not present

## 2017-04-16 DIAGNOSIS — M5412 Radiculopathy, cervical region: Secondary | ICD-10-CM | POA: Diagnosis not present

## 2017-04-16 DIAGNOSIS — M75102 Unspecified rotator cuff tear or rupture of left shoulder, not specified as traumatic: Secondary | ICD-10-CM | POA: Diagnosis not present

## 2017-04-16 NOTE — Telephone Encounter (Signed)
Spoke with patient. She is aware of CPAP machine order. Will place order now. Nothing else needed at time of call.

## 2017-04-16 NOTE — Telephone Encounter (Signed)
Rx not sent in on 04/09/17.  Message routed to [facility].  Will advise pt from that message.  Will close this note.

## 2017-04-16 NOTE — Telephone Encounter (Signed)
Cory, wasn't sure if this message made it to you.  May be a duplicate.  Last filled 03/09/17 #30 Last seen for acute visit on 01/23/17 Appt on 04/11/17 cancelled Rescheduled for 05/15/17. Please advise.

## 2017-04-16 NOTE — Telephone Encounter (Signed)
The patient called to have a Rx refilled last week for Eszopiclone 3 MG TABS   The pharmacy is stating that they haven't received it but the system is showing that it was sent 04/09/17 to the pharmacy.  Please advise

## 2017-04-16 NOTE — Telephone Encounter (Signed)
Ok to refill for 30 days  

## 2017-04-24 ENCOUNTER — Encounter (HOSPITAL_BASED_OUTPATIENT_CLINIC_OR_DEPARTMENT_OTHER): Payer: Medicare Other

## 2017-04-26 ENCOUNTER — Telehealth: Payer: Self-pay | Admitting: Pulmonary Disease

## 2017-04-26 NOTE — Telephone Encounter (Signed)
I just spoke with Debra Wright at [facility] and she stated that Debra Wright had left the patient a message yesterday to call and schedule for the CPAP. Debra Wright was going to try and call her again right now.[facility] has everything that they need. I know I had faxed 2 forms that Dr. Elsworth Soho had signed on 04/24/2017. I was hoping that was not what was holding up the process

## 2017-04-27 ENCOUNTER — Telehealth: Payer: Self-pay | Admitting: Pulmonary Disease

## 2017-04-27 NOTE — Telephone Encounter (Signed)
I called [facility] & spoke to [place].  Pt was to be set up today at 2:00 but called them to cancel & stated they would call back to reschedule.  I made Gilda aware that pt has requested order to be sent to another DME.   I have sent order to [facility] per pt's request.  Nothing further needed.

## 2017-04-27 NOTE — Telephone Encounter (Signed)
Spoke with pt, who states she has spoken with [facility] and is scheduled for setup today. Nothing further needed.

## 2017-05-14 ENCOUNTER — Other Ambulatory Visit: Payer: Self-pay | Admitting: Adult Health

## 2017-05-14 DIAGNOSIS — G4700 Insomnia, unspecified: Secondary | ICD-10-CM

## 2017-05-15 ENCOUNTER — Ambulatory Visit (INDEPENDENT_AMBULATORY_CARE_PROVIDER_SITE_OTHER): Payer: Medicare Other | Admitting: Adult Health

## 2017-05-15 ENCOUNTER — Encounter: Payer: Self-pay | Admitting: Adult Health

## 2017-05-15 VITALS — BP 122/62 | Temp 98.6°F | Ht 62.0 in | Wt 216.0 lb

## 2017-05-15 DIAGNOSIS — E119 Type 2 diabetes mellitus without complications: Secondary | ICD-10-CM | POA: Diagnosis not present

## 2017-05-15 DIAGNOSIS — Z760 Encounter for issue of repeat prescription: Secondary | ICD-10-CM | POA: Diagnosis not present

## 2017-05-15 DIAGNOSIS — G4700 Insomnia, unspecified: Secondary | ICD-10-CM | POA: Diagnosis not present

## 2017-05-15 DIAGNOSIS — F329 Major depressive disorder, single episode, unspecified: Secondary | ICD-10-CM

## 2017-05-15 DIAGNOSIS — F419 Anxiety disorder, unspecified: Secondary | ICD-10-CM

## 2017-05-15 LAB — POCT GLYCOSYLATED HEMOGLOBIN (HGB A1C): Hemoglobin A1C: 6.6

## 2017-05-15 MED ORDER — CITALOPRAM HYDROBROMIDE 40 MG PO TABS: 40.0000 mg | ORAL_TABLET | Freq: Every morning | ORAL | 3 refills | Status: DC

## 2017-05-15 MED ORDER — ESZOPICLONE 3 MG PO TABS: 3.0000 mg | ORAL_TABLET | Freq: Every day | ORAL | 0 refills | Status: DC

## 2017-05-15 NOTE — Progress Notes (Signed)
Subjective:    Patient ID: Debra Wright, female    DOB: 1951-09-25, 66 y.o.   MRN: [id number]  HPI  66 year old female who   has a past medical history of Anxiety; Arthritis; Basal cell carcinoma; Depression; Fibroid tumor; Frequent diarrhea; GERD (gastroesophageal reflux disease); High cholesterol; Hypertension; Insomnia; Migraine; Pernicious anemia; Type II diabetes mellitus ([place]); and Vitamin B 12 deficiency. She presents to the office today for three month follow up regarding diabetes. She is currently taking Metformin 1000mg  BID. Her last A1c was 7.4 in April 2018.   She reports that her blood sugars at home have been between 110 and 150.   She has been experiencing the feeling of some lows. She will eat a snack and feel better  Unfortunately, he weight is up 8 pounds since July. She is currently 216 lbs  She needs a refill of Lunesta and Celexa    Review of Systems See HPI   Past Medical History:  Diagnosis Date  . Anxiety   . Arthritis    "knees; right thumb" (10/14/2014)  . Basal cell carcinoma    "burned off upper lip & right shoulder"  . Depression   . Fibroid tumor   . Frequent diarrhea   . GERD (gastroesophageal reflux disease)   . High cholesterol   . Hypertension   . Insomnia   . Migraine    "frequently when I was younger; maybe monthly now" (10/14/2014)  . Pernicious anemia   . Type II diabetes mellitus ([place])   . Vitamin B 12 deficiency     Social History   Social History  . Marital status: Married    Spouse name: N/A  . Number of children: N/A  . Years of education: N/A   Occupational History  . Not on file.   Social History Main Topics  . Smoking status: Never Smoker  . Smokeless tobacco: Never Used  . Alcohol use Yes     Comment: 10/14/2014 "might have a drink when I'm out once/month"  . Drug use: No  . Sexual activity: Not Currently   Other Topics Concern  . 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 ABDOMINAL HYSTERECTOMY  1988  . ACHILLES TENDON SURGERY Right   . APPENDECTOMY  1988  . BILATERAL OOPHORECTOMY  2004  . CARPAL TUNNEL RELEASE Bilateral 1986  . CATARACT EXTRACTION W/ INTRAOCULAR LENS  IMPLANT, BILATERAL Bilateral 2011  . JOINT REPLACEMENT    . SHOULDER ARTHROSCOPY DISTAL CLAVICLE EXCISION AND OPEN ROTATOR CUFF REPAIR Right 11/2013  . TONSILLECTOMY  1950's  . TOTAL KNEE ARTHROPLASTY Left 10/13/2014   Procedure: LEFT TOTAL KNEE ARTHROPLASTY;  Surgeon: Mcarthur Rossetti, MD;  Location: [facility];  Service: Orthopedics;  Laterality: Left;    Family History  Problem Relation Age of Onset  . Arthritis Mother   . Hyperlipidemia Mother   . Diabetes Mother   . Hypertension Mother   . Arthritis Father   . Hyperlipidemia Father   . Heart disease Father   . Diabetes Father   . Stroke Brother   . Hypertension Brother   . Diabetes Brother   . Arthritis Maternal Grandmother   . Hyperlipidemia Maternal Grandmother   . Diabetes Maternal Grandmother   . Hypertension Maternal Grandmother   . Arthritis Maternal Grandfather   .  Hyperlipidemia Maternal Grandfather   . Hypertension Maternal Grandfather   . Arthritis Paternal Grandmother   . Hyperlipidemia Paternal Grandmother   . Diabetes Paternal Grandmother   . Hypertension Paternal Grandmother   . Arthritis Paternal Grandfather   . Hyperlipidemia Paternal Grandfather   . Diabetes Paternal Grandfather   . Hypertension Paternal Grandfather     Allergies  Allergen Reactions  . Antivert [Meclizine Hcl] Other (See Comments)    "makes me feel like my skin is falling off".    Current Outpatient Prescriptions on File Prior to Visit  Medication Sig Dispense Refill  . amLODipine (NORVASC) 10 MG tablet Take 1 tablet (10 mg total) by mouth daily. 90 tablet 3  . atorvastatin (LIPITOR) 40 MG tablet Take 1 tablet (40 mg total) by mouth daily. 90 tablet 3    . calcium carbonate (TUMS - DOSED IN MG ELEMENTAL CALCIUM) 500 MG chewable tablet Chew 2 tablets by mouth daily as needed for indigestion or heartburn.    . Calcium Carbonate-Vitamin D (CALCIUM 600 + D PO) Take 1 tablet by mouth daily.    . citalopram (CELEXA) 40 MG tablet Take 1 tablet (40 mg total) by mouth every morning. 90 tablet 3  . Eszopiclone 3 MG TABS TAKE 1 TABLET BY MOUTH ONCE DAILY AT BEDTIME 30 tablet 0  . ibuprofen (ADVIL,MOTRIN) 200 MG tablet Take 400 mg by mouth every 6 (six) hours as needed for moderate pain.    . metFORMIN (GLUCOPHAGE) 1000 MG tablet Take 1 tablet (1,000 mg total) by mouth 2 (two) times daily with a meal. 180 tablet 2  . omega-3 acid ethyl esters (LOVAZA) 1 G capsule Take 2 g by mouth 2 (two) times daily.    Marland Kitchen omeprazole (PRILOSEC) 40 MG capsule Take 1 capsule (40 mg total) by mouth every morning. 90 capsule 3   No current facility-administered medications on file prior to visit.     BP 122/62 (BP Location: Left Arm)   Temp 98.6 F (37 C) (Oral)   Ht 5\' 2"  (1.575 m)   Wt 216 lb (98 kg)   BMI 39.51 kg/m       Objective:   Physical Exam  Constitutional: She is oriented to person, place, and time. She appears well-developed and well-nourished. No distress.  Cardiovascular: Normal rate, regular rhythm, normal heart sounds and intact distal pulses.  Exam reveals no gallop and no friction rub.   No murmur heard. Pulmonary/Chest: Effort normal and breath sounds normal. No respiratory distress. She has no wheezes. She exhibits no tenderness.  Neurological: She is alert and oriented to person, place, and time.  Skin: Skin is warm and dry. No rash noted. She is not diaphoretic. No erythema. No pallor.  Psychiatric: She has a normal mood and affect. Her behavior is normal. Judgment and thought content normal.  Nursing note and vitals reviewed.     Assessment & Plan:  1. Controlled type 2 diabetes mellitus without complication, without long-term current use  of insulin (HCC)  - POCT A1C - 6.6 - has improved.  - work on weight loss  - Follow up in December for recheck   2. Insomnia, unspecified type  - Eszopiclone 3 MG TABS; Take 1 tablet (3 mg total) by mouth at bedtime. Take immediately before bedtime  Dispense: 30 tablet; Refill: 0  3. Anxiety and depression  - citalopram (CELEXA) 40 MG tablet; Take 1 tablet (40 mg total) by mouth every morning.  Dispense: 90 tablet; Refill: 3  4. Medication  refill  - citalopram (CELEXA) 40 MG tablet; Take 1 tablet (40 mg total) by mouth every morning.  Dispense: 90 tablet; Refill: 3 - Eszopiclone 3 MG TABS; Take 1 tablet (3 mg total) by mouth at bedtime. Take immediately before bedtime  Dispense: 30 tablet; Refill: 0   Dorothyann Peng, NP

## 2017-05-16 ENCOUNTER — Encounter (INDEPENDENT_AMBULATORY_CARE_PROVIDER_SITE_OTHER): Payer: Self-pay | Admitting: Physician Assistant

## 2017-05-16 ENCOUNTER — Ambulatory Visit (INDEPENDENT_AMBULATORY_CARE_PROVIDER_SITE_OTHER): Payer: Medicare Other | Admitting: Physician Assistant

## 2017-05-16 ENCOUNTER — Ambulatory Visit (INDEPENDENT_AMBULATORY_CARE_PROVIDER_SITE_OTHER): Payer: Medicare Other

## 2017-05-16 VITALS — Ht 62.0 in | Wt 210.0 lb

## 2017-05-16 DIAGNOSIS — M25561 Pain in right knee: Secondary | ICD-10-CM | POA: Diagnosis not present

## 2017-05-16 DIAGNOSIS — M1711 Unilateral primary osteoarthritis, right knee: Secondary | ICD-10-CM

## 2017-05-16 DIAGNOSIS — M25522 Pain in left elbow: Secondary | ICD-10-CM | POA: Diagnosis not present

## 2017-05-16 MED ORDER — LIDOCAINE HCL 1 % IJ SOLN
0.5000 mL | INTRAMUSCULAR | Status: AC | PRN
  Administered 2017-05-16: .5 mL

## 2017-05-16 MED ORDER — LIDOCAINE HCL 1 % IJ SOLN
3.0000 mL | INTRAMUSCULAR | Status: AC | PRN
  Administered 2017-05-16: 3 mL

## 2017-05-16 MED ORDER — BUPIVACAINE HCL 0.25 % IJ SOLN
0.3300 mL | INTRAMUSCULAR | Status: AC | PRN
  Administered 2017-05-16: .33 mL

## 2017-05-16 MED ORDER — METHYLPREDNISOLONE ACETATE 40 MG/ML IJ SUSP
40.0000 mg | INTRAMUSCULAR | Status: AC | PRN
  Administered 2017-05-16: 40 mg via INTRA_ARTICULAR

## 2017-05-16 NOTE — Progress Notes (Signed)
Office Visit Note   Patient: Debra Wright           Date of Birth: 1951/01/16           MRN: [id number] Visit Date: 05/16/2017              Requested by: Dorothyann Peng, NP [place], [place] [address] PCP: Dorothyann Peng, NP   Assessment & Plan: Visit Diagnoses:  1. Acute pain of right knee   2. Pain in left elbow   3. Unilateral primary osteoarthritis, right knee     Plan: Discussed lifting techniques of in regards to her elbow also stretching techniques. She has full-time gel she can apply over the medial epicondyle. In regards to her knee will try to gain approval for Visco supplementation injection and then call her once this is available.  Follow-Up Instructions: Return for Supplemental injection.   Orders:  Orders Placed This Encounter  Procedures  . Large Joint Injection/Arthrocentesis  . Hand/Upper Extremity Injection/Arthrocentesis  . XR Knee 1-2 Views Right  . XR Elbow 2 Views Left   No orders of the defined types were placed in this encounter.     Procedures: Large Joint Inj Date/Time: 05/16/2017 5:21 PM Performed by: Pete Pelt Authorized by: Pete Pelt   Consent Given by:  Patient Indications:  Pain Location:  Knee Site:  R knee Needle Size:  22 G Needle Length:  1.5 inches Approach:  Anterolateral Ultrasound Guidance: No   Fluoroscopic Guidance: No   Medications:  40 mg methylPREDNISolone acetate 40 MG/ML; 3 mL lidocaine 1 % Aspiration Attempted: No   Patient tolerance:  Patient tolerated the procedure well with no immediate complications Hand/UE Inj Date/Time: 05/16/2017 5:21 PM Performed by: Pete Pelt Authorized by: Pete Pelt   Consent Given by:  Patient Indications:  Pain Condition: medial epicondylitis   Needle Size:  25 G Approach:  Medial Medications:  0.33 mL bupivacaine 0.25 %; 0.5 mL lidocaine 1 %     Clinical Data: No additional findings.   Subjective: Chief Complaint  Patient  presents with  . Left Elbow - Pain  . Right Knee - Pain    HPI Debra Wright is well known to Dr. Trevor Mace service comes in today due to right knee pain she's concerned that she may be bone-on-bone like her left knee was prior to undergoing left total knee arthroplasty some 3 years ago by Dr. Ninfa Linden. She's had no treatment for this. She's had no injury to the right knee. She is also having left elbow pain and she points the medial aspect the elbow no known injury. She does report that pain is worse whenever she is reading a book at night in bed. Denies any numbness or tingling down the arm. She is diabetic very well controlled and reports last hemoglobin A1c was 6.9  Review of Systems Please see history of present illness otherwise negative  Objective: Vital Signs: Ht 5\' 2"  (1.575 m)   Wt 210 lb (95.3 kg)   BMI 38.41 kg/m   Physical Exam  Constitutional: She is oriented to person, place, and time. She appears well-developed and well-nourished. No distress.  Pulmonary/Chest: Effort normal.  Neurological: She is alert and oriented to person, place, and time.  Skin: She is not diaphoretic.  Psychiatric: She has a normal mood and affect. Her behavior is normal.    Ortho Exam Left knee well-healed surgical incision good range of motion without pain. Right knee no instability  valgus varus stressing. She has tenderness along medial joint line and peripatellar region. No effusion or abnormal warmth. Full extension flexion to beyond 90. Right elbow she has negative Tinel's over the ulnar nerve. Tenderness over the medial condyle. Flexion of the wrist against resistance causes pain lateral elbow. She otherwise has good range of motion elbow radial pulses 2+. Sensation grossly intact to the hand.  Specialty Comments:  No specialty comments available.  Imaging: Xr Elbow 2 Views Left  Result Date: 05/16/2017 AP lateral views left elbow: Elbow is well located. No acute fractures. Elbow joint  is well maintained.  Xr Knee 1-2 Views Right  Result Date: 05/16/2017 AP right knee and lateral view of the right knee: No acute fracture. Knee is well located. Tricompartmental arthritis moderate to severe throughout.    PMFS History: Patient Active Problem List   Diagnosis Date Noted  . OSA (obstructive sleep apnea) 02/13/2017  . Controlled type 2 diabetes mellitus without complication, without long-term current use of insulin ([place]) 10/31/2016  . Essential hypertension 08/02/2016  . Anxiety and depression 08/02/2016  . Insomnia 08/02/2016  . Arthritis of left knee 10/13/2014  . Status post total left knee replacement 10/13/2014   Past Medical History:  Diagnosis Date  . Anxiety   . Arthritis    "knees; right thumb" (10/14/2014)  . Basal cell carcinoma    "burned off upper lip & right shoulder"  . Depression   . Fibroid tumor   . Frequent diarrhea   . GERD (gastroesophageal reflux disease)   . High cholesterol   . Hypertension   . Insomnia   . Migraine    "frequently when I was younger; maybe monthly now" (10/14/2014)  . Pernicious anemia   . Type II diabetes mellitus ([place])   . Vitamin B 12 deficiency     Family History  Problem Relation Age of Onset  . Arthritis Mother   . Hyperlipidemia Mother   . Diabetes Mother   . Hypertension Mother   . Arthritis Father   . Hyperlipidemia Father   . Heart disease Father   . Diabetes Father   . Stroke Brother   . Hypertension Brother   . Diabetes Brother   . Arthritis Maternal Grandmother   . Hyperlipidemia Maternal Grandmother   . Diabetes Maternal Grandmother   . Hypertension Maternal Grandmother   . Arthritis Maternal Grandfather   . Hyperlipidemia Maternal Grandfather   . Hypertension Maternal Grandfather   . Arthritis Paternal Grandmother   . Hyperlipidemia Paternal Grandmother   . Diabetes Paternal Grandmother   . Hypertension Paternal Grandmother   . Arthritis Paternal Grandfather   . Hyperlipidemia Paternal  Grandfather   . Diabetes Paternal Grandfather   . Hypertension Paternal Grandfather     Past Surgical History:  Procedure Laterality Date  . ABDOMINAL HYSTERECTOMY  1988  . ACHILLES TENDON SURGERY Right   . APPENDECTOMY  1988  . BILATERAL OOPHORECTOMY  2004  . CARPAL TUNNEL RELEASE Bilateral 1986  . CATARACT EXTRACTION W/ INTRAOCULAR LENS  IMPLANT, BILATERAL Bilateral 2011  . JOINT REPLACEMENT    . SHOULDER ARTHROSCOPY DISTAL CLAVICLE EXCISION AND OPEN ROTATOR CUFF REPAIR Right 11/2013  . TONSILLECTOMY  1950's  . TOTAL KNEE ARTHROPLASTY Left 10/13/2014   Procedure: LEFT TOTAL KNEE ARTHROPLASTY;  Surgeon: Mcarthur Rossetti, MD;  Location: [facility];  Service: Orthopedics;  Laterality: Left;   Social History   Occupational History  . Not on file.   Social History Main Topics  . Smoking status:  Never Smoker  . Smokeless tobacco: Never Used  . Alcohol use Yes     Comment: 10/14/2014 "might have a drink when I'm out once/month"  . Drug use: No  . Sexual activity: Not Currently

## 2017-05-17 DIAGNOSIS — M4726 Other spondylosis with radiculopathy, lumbar region: Secondary | ICD-10-CM | POA: Diagnosis not present

## 2017-05-17 DIAGNOSIS — M5136 Other intervertebral disc degeneration, lumbar region: Secondary | ICD-10-CM | POA: Diagnosis not present

## 2017-05-17 DIAGNOSIS — M48061 Spinal stenosis, lumbar region without neurogenic claudication: Secondary | ICD-10-CM | POA: Diagnosis not present

## 2017-05-22 ENCOUNTER — Telehealth (INDEPENDENT_AMBULATORY_CARE_PROVIDER_SITE_OTHER): Payer: Self-pay

## 2017-05-22 NOTE — Telephone Encounter (Signed)
IC patient to get appt scheduled for Monovisc injection with Dr Ninfa Linden. I left detailed message on her VM advising primary insurance cover 80% and whatever her secondary did not cover she would be responsible for. Advised patient to [facility] and schedule appt if wanted to proceed.

## 2017-05-30 ENCOUNTER — Telehealth (INDEPENDENT_AMBULATORY_CARE_PROVIDER_SITE_OTHER): Payer: Self-pay | Admitting: Physician Assistant

## 2017-05-30 NOTE — Telephone Encounter (Signed)
Returned call to patient left message for return call concerning scheduling appointment for injection

## 2017-05-31 NOTE — Telephone Encounter (Signed)
Debra Wright, patient is returning your call.  She LMVM. Pls call her, [phone].

## 2017-06-01 NOTE — Telephone Encounter (Signed)
Mel Almond to you

## 2017-06-01 NOTE — Telephone Encounter (Signed)
Patient scheduled  For injection 06/11/17 at 2:45pm

## 2017-06-04 ENCOUNTER — Other Ambulatory Visit: Payer: Self-pay | Admitting: Adult Health

## 2017-06-04 DIAGNOSIS — Z760 Encounter for issue of repeat prescription: Secondary | ICD-10-CM

## 2017-06-04 DIAGNOSIS — E118 Type 2 diabetes mellitus with unspecified complications: Secondary | ICD-10-CM

## 2017-06-05 ENCOUNTER — Encounter: Payer: Self-pay | Admitting: Pulmonary Disease

## 2017-06-05 NOTE — Telephone Encounter (Signed)
Sent to the pharmacy by e-scribe for 6 months. Pt has upcoming appt on 09/11/17.

## 2017-06-11 ENCOUNTER — Ambulatory Visit (INDEPENDENT_AMBULATORY_CARE_PROVIDER_SITE_OTHER): Payer: Medicare Other | Admitting: Physician Assistant

## 2017-06-12 ENCOUNTER — Other Ambulatory Visit: Payer: Self-pay | Admitting: Adult Health

## 2017-06-12 DIAGNOSIS — Z760 Encounter for issue of repeat prescription: Secondary | ICD-10-CM

## 2017-06-12 DIAGNOSIS — G4700 Insomnia, unspecified: Secondary | ICD-10-CM

## 2017-06-13 ENCOUNTER — Other Ambulatory Visit: Payer: Self-pay | Admitting: Adult Health

## 2017-06-13 DIAGNOSIS — G4700 Insomnia, unspecified: Secondary | ICD-10-CM

## 2017-06-13 DIAGNOSIS — Z760 Encounter for issue of repeat prescription: Secondary | ICD-10-CM

## 2017-06-14 ENCOUNTER — Ambulatory Visit (INDEPENDENT_AMBULATORY_CARE_PROVIDER_SITE_OTHER): Payer: Medicare Other | Admitting: Orthopaedic Surgery

## 2017-06-14 DIAGNOSIS — M1711 Unilateral primary osteoarthritis, right knee: Secondary | ICD-10-CM | POA: Diagnosis not present

## 2017-06-14 MED ORDER — HYALURONAN 88 MG/4ML IX SOSY
88.0000 mg | PREFILLED_SYRINGE | INTRA_ARTICULAR | Status: AC | PRN
  Administered 2017-06-14: 88 mg via INTRA_ARTICULAR

## 2017-06-14 NOTE — Progress Notes (Signed)
   Procedure Note  Patient: Debra Wright             Date of Birth: 1951/08/08           MRN: [id number]             Visit Date: 06/14/2017  Procedures: Visit Diagnoses: Unilateral primary osteoarthritis, right knee  Large Joint Inj Date/Time: 06/14/2017 1:37 PM Performed by: Mcarthur Rossetti Authorized by: Mcarthur Rossetti   Location:  Knee Site:  R knee Ultrasound Guidance: No   Fluoroscopic Guidance: No   Arthrogram: No   Medications:  88 mg Hyaluronan 88 MG/4ML   The patient is here today for scheduled hyaluronic acid injection in her right knee. This is been a prearranged injection to treat her motor osteoarthritic pain from moderate arthritis from her knee. She understands fully the goals of these injections in the risk and benefits involved.  On exam she does have a painful arc of motion of her right knee but his is ligamentously stable and there is no effusion. Range of motion is full. It is just painful with patellofemoral crepitation and slight varus malalignment.  She tolerated the hyaluronic acid injection well. She'll follow-up as needed with the understanding that she can always do a steroid injection over the wintertime and her knee if needed. All questions were encouraged and answered.

## 2017-06-14 NOTE — Telephone Encounter (Signed)
Pharmacy opens at 21, rx was sent 06/12/17.

## 2017-06-18 ENCOUNTER — Other Ambulatory Visit: Payer: Self-pay | Admitting: Adult Health

## 2017-06-18 DIAGNOSIS — G4700 Insomnia, unspecified: Secondary | ICD-10-CM

## 2017-06-18 DIAGNOSIS — Z760 Encounter for issue of repeat prescription: Secondary | ICD-10-CM

## 2017-06-18 NOTE — Telephone Encounter (Signed)
Received a fax from [facility] that they did not receive fax sent.  Verbally authorized #30 with 0 refills.  Pharmacy to fill.

## 2017-06-21 ENCOUNTER — Ambulatory Visit
Admission: RE | Admit: 2017-06-21 | Discharge: 2017-06-21 | Disposition: A | Discharge status: Home / Self Care | Payer: Medicare Other | Source: Ambulatory Visit | Attending: Neurosurgery | Admitting: Neurosurgery

## 2017-06-21 ENCOUNTER — Other Ambulatory Visit: Payer: Medicare Other

## 2017-06-21 DIAGNOSIS — D492 Neoplasm of unspecified behavior of bone, soft tissue, and skin: Secondary | ICD-10-CM

## 2017-06-21 DIAGNOSIS — M545 Low back pain: Secondary | ICD-10-CM | POA: Diagnosis not present

## 2017-06-21 MED ORDER — GADOBENATE DIMEGLUMINE 529 MG/ML IV SOLN
20.0000 mL | Freq: Once | INTRAVENOUS | Status: AC | PRN
Start: 2017-06-21 — End: 2017-06-21
  Administered 2017-06-21: 20 mL via INTRAVENOUS

## 2017-06-22 ENCOUNTER — Encounter: Payer: Self-pay | Admitting: Adult Health

## 2017-06-29 DIAGNOSIS — M47816 Spondylosis without myelopathy or radiculopathy, lumbar region: Secondary | ICD-10-CM | POA: Diagnosis not present

## 2017-06-29 DIAGNOSIS — M5136 Other intervertebral disc degeneration, lumbar region: Secondary | ICD-10-CM | POA: Diagnosis not present

## 2017-06-29 DIAGNOSIS — M5416 Radiculopathy, lumbar region: Secondary | ICD-10-CM | POA: Diagnosis not present

## 2017-06-29 DIAGNOSIS — M48062 Spinal stenosis, lumbar region with neurogenic claudication: Secondary | ICD-10-CM | POA: Diagnosis not present

## 2017-08-01 ENCOUNTER — Ambulatory Visit (INDEPENDENT_AMBULATORY_CARE_PROVIDER_SITE_OTHER): Payer: Medicare Other | Admitting: Pulmonary Disease

## 2017-08-01 ENCOUNTER — Encounter: Payer: Self-pay | Admitting: Pulmonary Disease

## 2017-08-01 DIAGNOSIS — F5104 Psychophysiologic insomnia: Secondary | ICD-10-CM | POA: Diagnosis not present

## 2017-08-01 DIAGNOSIS — G4733 Obstructive sleep apnea (adult) (pediatric): Secondary | ICD-10-CM

## 2017-08-01 NOTE — Progress Notes (Signed)
   Subjective:    Patient ID: Debra Wright, female    DOB: 11-23-1950, 66 y.o.   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 She has  sleep onset insomnia for which she has taken multiple medications over the past few years including Ambien, Benadryl, Tylenol PM and melatonin. For the last 4 years she has been maintained on Lunesta 3 mg.  Her home sleep study showed severe OSA, based on this,Rx to DME for Auto CPAP 10-18 cm H2O with a Small size Philips Respironics Full Face Mask Amara View mask and heated humidification  She has started using this for a month and had remarkable improvement in her daytime somnolence and fatigue.  She feels well rested and waking up.  She is tolerating a full facemask well.  Does have a leak but this does not seem to wake her up. Download confirms large leak, average pressure is only 11 cm with good control of events and acceptable compliance about 5 hours every night  Significant tests/ events reviewed  HST  03/2017  severe OSA with 35 events per hour  Review of Systems Patient denies significant dyspnea,cough, hemoptysis,  chest pain, palpitations, pedal edema, orthopnea, paroxysmal nocturnal dyspnea, lightheadedness, nausea, vomiting, abdominal or  leg pains      Objective:   Physical Exam   Gen. Pleasant, obese, in no distress ENT - no lesions, no post nasal drip Neck: No JVD, no thyromegaly, no carotid bruits Lungs: no use of accessory muscles, no dullness to percussion, decreased without rales or rhonchi  Cardiovascular: Rhythm regular, heart sounds  normal, no murmurs or gallops, no peripheral edema Musculoskeletal: No deformities, no cyanosis or clubbing , no tremors, uses cane        Assessment & Plan:

## 2017-08-01 NOTE — Assessment & Plan Note (Signed)
Decrease auto settings 10-15 cm, since average pressure requirement is only 11 cm surprisingly -was 18 cm in titration study.  Hopefully this will help to decrease the leak  Weight loss encouraged, compliance with goal of at least 4-6 hrs every night is the expectation. Advised against medications with sedative side effects Cautioned against driving when sleepy - understanding that sleepiness will vary on a day to day basis

## 2017-08-01 NOTE — Assessment & Plan Note (Signed)
Remains on lunesta.   

## 2017-08-01 NOTE — Patient Instructions (Signed)
Decrease auto settings 10-15 cm Expectation is that you use machine at least 6 hours every night

## 2017-08-06 ENCOUNTER — Other Ambulatory Visit: Payer: Self-pay | Admitting: Adult Health

## 2017-08-06 DIAGNOSIS — G4700 Insomnia, unspecified: Secondary | ICD-10-CM

## 2017-08-06 DIAGNOSIS — Z760 Encounter for issue of repeat prescription: Secondary | ICD-10-CM

## 2017-08-08 NOTE — Telephone Encounter (Signed)
Ok to refill for 30 days  

## 2017-08-08 NOTE — Telephone Encounter (Signed)
Last filled on 06/12/17 #30

## 2017-08-09 NOTE — Telephone Encounter (Signed)
Called to the pharmacy and left on machine. 

## 2017-08-16 DIAGNOSIS — M48061 Spinal stenosis, lumbar region without neurogenic claudication: Secondary | ICD-10-CM | POA: Diagnosis not present

## 2017-08-16 DIAGNOSIS — M5136 Other intervertebral disc degeneration, lumbar region: Secondary | ICD-10-CM | POA: Diagnosis not present

## 2017-08-16 DIAGNOSIS — M4726 Other spondylosis with radiculopathy, lumbar region: Secondary | ICD-10-CM | POA: Diagnosis not present

## 2017-08-17 ENCOUNTER — Ambulatory Visit: Payer: Medicare Other | Admitting: Pulmonary Disease

## 2017-08-21 ENCOUNTER — Other Ambulatory Visit: Payer: Self-pay | Admitting: Adult Health

## 2017-08-21 DIAGNOSIS — Z760 Encounter for issue of repeat prescription: Secondary | ICD-10-CM

## 2017-08-21 NOTE — Telephone Encounter (Signed)
Sent to the pharmacy by e-scribe. 

## 2017-09-06 ENCOUNTER — Other Ambulatory Visit: Payer: Self-pay | Admitting: Adult Health

## 2017-09-06 DIAGNOSIS — Z760 Encounter for issue of repeat prescription: Secondary | ICD-10-CM

## 2017-09-07 NOTE — Telephone Encounter (Signed)
Sent to the pharmacy by e-scribe. 

## 2017-09-11 ENCOUNTER — Ambulatory Visit: Payer: Medicare Other | Admitting: Adult Health

## 2017-09-13 ENCOUNTER — Other Ambulatory Visit: Payer: Self-pay | Admitting: Adult Health

## 2017-09-13 DIAGNOSIS — G4700 Insomnia, unspecified: Secondary | ICD-10-CM

## 2017-09-13 DIAGNOSIS — Z760 Encounter for issue of repeat prescription: Secondary | ICD-10-CM

## 2017-09-13 NOTE — Telephone Encounter (Signed)
Ok to refill 

## 2017-09-14 ENCOUNTER — Ambulatory Visit: Payer: Medicare Other | Admitting: Adult Health

## 2017-09-14 NOTE — Telephone Encounter (Signed)
Called to the pharmacy and left on machine. 

## 2017-09-20 ENCOUNTER — Encounter: Payer: Self-pay | Admitting: Adult Health

## 2017-09-20 ENCOUNTER — Ambulatory Visit (INDEPENDENT_AMBULATORY_CARE_PROVIDER_SITE_OTHER): Payer: Medicare Other | Admitting: Adult Health

## 2017-09-20 VITALS — BP 124/70 | Temp 99.1°F | Wt 224.0 lb

## 2017-09-20 DIAGNOSIS — E119 Type 2 diabetes mellitus without complications: Secondary | ICD-10-CM | POA: Diagnosis not present

## 2017-09-20 LAB — POCT GLYCOSYLATED HEMOGLOBIN (HGB A1C): Hemoglobin A1C: 7.4

## 2017-09-20 NOTE — Progress Notes (Signed)
Subjective:    Patient ID: Debra Wright, female    DOB: 08-10-1951, 66 y.o.   MRN: [id number]  HPI  66 year old female who  has a past medical history of Anxiety, Arthritis, Basal cell carcinoma, Depression, Fibroid tumor, Frequent diarrhea, GERD (gastroesophageal reflux disease), High cholesterol, Hypertension, Insomnia, Migraine, Pernicious anemia, Type II diabetes mellitus ([place]), and Vitamin B 12 deficiency.  She presents to the office today for follow up of diabates. She was last seen in August at which time her A1c was 6.6. She is controlled with Metformin 1000 mg BID. She reports slightly higher blood sugars sine the holiday season started. She reports BS between 120-150  Wt Readings from Last 3 Encounters:  09/20/17 224 lb (101.6 kg)  08/01/17 210 lb (95.3 kg)  05/16/17 210 lb (95.3 kg)    Review of Systems See HP   Past Medical History:  Diagnosis Date  . Anxiety   . Arthritis    "knees; right thumb" (10/14/2014)  . Basal cell carcinoma    "burned off upper lip & right shoulder"  . Depression   . Fibroid tumor   . Frequent diarrhea   . GERD (gastroesophageal reflux disease)   . High cholesterol   . Hypertension   . Insomnia   . Migraine    "frequently when I was younger; maybe monthly now" (10/14/2014)  . Pernicious anemia   . Type II diabetes mellitus ([place])   . Vitamin B 12 deficiency     Social History   Socioeconomic History  . Marital status: Married    Spouse name: Not on file  . Number of children: Not on file  . Years of education: Not on file  . Highest education level: Not on file  Social Needs  . Financial resource strain: Not on file  . Food insecurity - worry: Not on file  . Food insecurity - inability: Not on file  . Transportation needs - medical: Not on file  . Transportation needs - non-medical: Not on file  Occupational History  . Not on file  Tobacco Use  . Smoking status: Never Smoker  . Smokeless tobacco: Never Used  Substance and  Sexual Activity  . Alcohol use: Yes    Comment: 10/14/2014 "might have a drink when I'm out once/month"  . Drug use: No  . Sexual activity: Not Currently  Other Topics Concern  . 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 ABDOMINAL HYSTERECTOMY  1988  . ACHILLES TENDON SURGERY Right   . APPENDECTOMY  1988  . BILATERAL OOPHORECTOMY  2004  . CARPAL TUNNEL RELEASE Bilateral 1986  . CATARACT EXTRACTION W/ INTRAOCULAR LENS  IMPLANT, BILATERAL Bilateral 2011  . JOINT REPLACEMENT    . SHOULDER ARTHROSCOPY DISTAL CLAVICLE EXCISION AND OPEN ROTATOR CUFF REPAIR Right 11/2013  . TONSILLECTOMY  1950's  . TOTAL KNEE ARTHROPLASTY Left 10/13/2014   Procedure: LEFT TOTAL KNEE ARTHROPLASTY;  Surgeon: Mcarthur Rossetti, MD;  Location: [facility];  Service: Orthopedics;  Laterality: Left;    Family History  Problem Relation Age of Onset  . Arthritis Mother   . Hyperlipidemia Mother   . Diabetes Mother   . Hypertension Mother   . Arthritis Father   . Hyperlipidemia Father   . Heart disease Father   . Diabetes Father   .  Stroke Brother   . Hypertension Brother   . Diabetes Brother   . Arthritis Maternal Grandmother   . Hyperlipidemia Maternal Grandmother   . Diabetes Maternal Grandmother   . Hypertension Maternal Grandmother   . Arthritis Maternal Grandfather   . Hyperlipidemia Maternal Grandfather   . Hypertension Maternal Grandfather   . Arthritis Paternal Grandmother   . Hyperlipidemia Paternal Grandmother   . Diabetes Paternal Grandmother   . Hypertension Paternal Grandmother   . Arthritis Paternal Grandfather   . Hyperlipidemia Paternal Grandfather   . Diabetes Paternal Grandfather   . Hypertension Paternal Grandfather     Allergies  Allergen Reactions  . Antivert [Meclizine Hcl] Other (See Comments)    "makes me feel like my skin is falling off".    Current  Outpatient Medications on File Prior to Visit  Medication Sig Dispense Refill  . amLODipine (NORVASC) 10 MG tablet TAKE ONE TABLET BY MOUTH ONCE DAILY 90 tablet 1  . atorvastatin (LIPITOR) 40 MG tablet Take 1 tablet (40 mg total) by mouth daily. 90 tablet 3  . calcium carbonate (TUMS - DOSED IN MG ELEMENTAL CALCIUM) 500 MG chewable tablet Chew 2 tablets by mouth daily as needed for indigestion or heartburn.    . Calcium Carbonate-Vitamin D (CALCIUM 600 + D PO) Take 1 tablet by mouth daily.    . citalopram (CELEXA) 40 MG tablet Take 1 tablet (40 mg total) by mouth every morning. 90 tablet 3  . Eszopiclone 3 MG TABS TAKE ONE TABLET BY MOUTH IMMEDIATELY BEFORE BEDTIME 30 tablet 0  . ibuprofen (ADVIL,MOTRIN) 200 MG tablet Take 400 mg by mouth every 6 (six) hours as needed for moderate pain.    . metFORMIN (GLUCOPHAGE) 1000 MG tablet TAKE ONE TABLET BY MOUTH TWICE DAILY WITH MEALS 180 tablet 1  . omega-3 acid ethyl esters (LOVAZA) 1 G capsule Take 2 g by mouth 2 (two) times daily.    Marland Kitchen omeprazole (PRILOSEC) 40 MG capsule TAKE ONE CAPSULE BY MOUTH ONCE DAILY IN THE MORNING 90 capsule 1   No current facility-administered medications on file prior to visit.     BP 124/70 (BP Location: Left Arm)   Temp 99.1 F (37.3 C) (Oral)   Wt 224 lb (101.6 kg)   BMI 40.97 kg/m       Objective:   Physical Exam  Constitutional: She is oriented to person, place, and time. She appears well-developed and well-nourished. No distress.  Cardiovascular: Normal rate, regular rhythm, normal heart sounds and intact distal pulses. Exam reveals no gallop and no friction rub.  No murmur heard. Pulmonary/Chest: Effort normal and breath sounds normal. No respiratory distress. She has no wheezes. She has no rales. She exhibits no tenderness.  Neurological: She is alert and oriented to person, place, and time.  Skin: Skin is warm and dry. No rash noted. She is not diaphoretic. No erythema. No pallor.  Psychiatric: She has  a normal mood and affect. Her behavior is normal. Thought content normal.  Nursing note and vitals reviewed.     Assessment & Plan:  1. Controlled type 2 diabetes mellitus without complication, without long-term current use of insulin (HCC) - POCT A1C - 7.4  - Has increased and so has her weight  - Educated on the importance of diet and exercise  - No change in medication at this time  - Follow up in 4 months for CPE   Dorothyann Peng, NP

## 2017-10-01 ENCOUNTER — Encounter (INDEPENDENT_AMBULATORY_CARE_PROVIDER_SITE_OTHER): Payer: Self-pay | Admitting: Physician Assistant

## 2017-10-01 ENCOUNTER — Ambulatory Visit (INDEPENDENT_AMBULATORY_CARE_PROVIDER_SITE_OTHER): Payer: Medicare Other | Admitting: Physician Assistant

## 2017-10-01 DIAGNOSIS — M1711 Unilateral primary osteoarthritis, right knee: Secondary | ICD-10-CM | POA: Diagnosis not present

## 2017-10-01 DIAGNOSIS — M7661 Achilles tendinitis, right leg: Secondary | ICD-10-CM

## 2017-10-01 MED ORDER — METHYLPREDNISOLONE ACETATE 40 MG/ML IJ SUSP
40.0000 mg | INTRAMUSCULAR | Status: AC | PRN
  Administered 2017-10-01: 40 mg via INTRA_ARTICULAR

## 2017-10-01 MED ORDER — DICLOFENAC SODIUM 1 % TD GEL: 4.0000 g | Freq: Four times a day (QID) | TRANSDERMAL | 0 refills | Status: DC

## 2017-10-01 MED ORDER — LIDOCAINE HCL 1 % IJ SOLN
3.0000 mL | INTRAMUSCULAR | Status: AC | PRN
  Administered 2017-10-01: 3 mL

## 2017-10-01 NOTE — Progress Notes (Signed)
Office Visit Note   Patient: Debra Wright           Date of Birth: [birth year]           MRN: [id number] Visit Date: 10/01/2017              Requested by: Debra Peng, NP Debra Wright,  [id number] PCP: Debra Peng, NP   Assessment & Plan: Visit Diagnoses:  1. Achilles tendinitis, right leg   2. Primary osteoarthritis of right knee     Plan: Gastrocsoleus stretching discussed with her.  Shoe wear discussed with her at length.  She will monitor glucose levels over the next few days due to the fact that she received the steroid injection in the right knee.  We will called in Voltaren gel which she can use on the knee and the Achilles region.  She will follow-up with [place] in 6 weeks check her progress or lack of.  Follow-Up Instructions: No Follow-up on file.   Orders:  No orders of the defined types were placed in this encounter.  Meds ordered this encounter  Medications  . diclofenac sodium (VOLTAREN) 1 % GEL    Sig: Apply 4 g topically 4 (four) times daily. Apply to R knee and R heel    Dispense:  1 Tube    Refill:  0      Procedures: Large Joint Inj: R knee on 10/01/2017 3:42 PM Indications: pain Details: 22 G 1.5 in needle, anterolateral approach  Arthrogram: No  Medications: 3 mL lidocaine 1 %; 40 mg methylPREDNISolone acetate 40 MG/ML Outcome: tolerated well, no immediate complications Procedure, treatment alternatives, risks and benefits explained, specific risks discussed. Consent was given by the patient. Immediately prior to procedure a time out was called to verify the correct patient, procedure, equipment, support staff and site/side marked as required. Patient was prepped and draped in the usual sterile fashion.       Clinical Data: No additional findings.   Subjective: Right knee pain  Right heel pain   HPI Debra Wright is well-known to Dr. Ninfa Wright service comes in today due to right knee pain which she has known severe  osteoarthritis right knee.  She had a Monovisc injection in her right knee back in September states it helped with overall motion of the knee but not so much with pain in the knee.  Unfortunately early November she was trying to move her husband's motorized scooter and injured her knee.  She also is having pain in her Achilles region.  She is had previous Haglund's deformity surgery on the right.  She does report that she wears open back shoe review type shoe.  She does not doing well since undergoing surgery with anything touching the posterior aspect of her right heel. Review of Systems See HPI   Objective: Vital Signs: There were no vitals taken for this visit.  Physical Exam  Constitutional: She is oriented to person, place, and time. She appears well-developed and well-nourished. No distress.  Pulmonary/Chest: Effort normal.  Neurological: She is alert and oriented to person, place, and time.  Skin: She is not diaphoretic.  Psychiatric: She has a normal mood and affect.    Ortho Exam Global tenderness right knee no effusion abnormal warmth erythema.  She has good range of motion of the knee.  No instability valgus varus stressing.  Right calf supple slight tenderness.  Tenderness over the Achilles distally.  Thompson test negative.  Well-healed scar with keloid formation  from Haglund's deformity surgery. Specialty Comments:  No specialty comments available.  Imaging: No results found.   PMFS History: Patient Active Problem List   Diagnosis Date Noted  . Unilateral primary osteoarthritis, right knee 06/14/2017  . OSA (obstructive sleep apnea) 02/13/2017  . Controlled type 2 diabetes mellitus without complication, without long-term current use of insulin ([place]) 10/31/2016  . Essential hypertension 08/02/2016  . Anxiety and depression 08/02/2016  . Insomnia 08/02/2016  . Arthritis of left knee 10/13/2014  . Status post total left knee replacement 10/13/2014   Past Medical  History:  Diagnosis Date  . Anxiety   . Arthritis    "knees; right thumb" (10/14/2014)  . Basal cell carcinoma    "burned off upper lip & right shoulder"  . Depression   . Fibroid tumor   . Frequent diarrhea   . GERD (gastroesophageal reflux disease)   . High cholesterol   . Hypertension   . Insomnia   . Migraine    "frequently when I was younger; maybe monthly now" (10/14/2014)  . Pernicious anemia   . Type II diabetes mellitus ([place])   . Vitamin B 12 deficiency     Family History  Problem Relation Age of Onset  . Arthritis Mother   . Hyperlipidemia Mother   . Diabetes Mother   . Hypertension Mother   . Arthritis Father   . Hyperlipidemia Father   . Heart disease Father   . Diabetes Father   . Stroke Brother   . Hypertension Brother   . Diabetes Brother   . Arthritis Maternal Grandmother   . Hyperlipidemia Maternal Grandmother   . Diabetes Maternal Grandmother   . Hypertension Maternal Grandmother   . Arthritis Maternal Grandfather   . Hyperlipidemia Maternal Grandfather   . Hypertension Maternal Grandfather   . Arthritis Paternal Grandmother   . Hyperlipidemia Paternal Grandmother   . Diabetes Paternal Grandmother   . Hypertension Paternal Grandmother   . Arthritis Paternal Grandfather   . Hyperlipidemia Paternal Grandfather   . Diabetes Paternal Grandfather   . Hypertension Paternal Grandfather     Past Surgical History:  Procedure Laterality Date  . ABDOMINAL HYSTERECTOMY  1988  . ACHILLES TENDON SURGERY Right   . APPENDECTOMY  1988  . BILATERAL OOPHORECTOMY  2004  . CARPAL TUNNEL RELEASE Bilateral 1986  . CATARACT EXTRACTION W/ INTRAOCULAR LENS  IMPLANT, BILATERAL Bilateral 2011  . JOINT REPLACEMENT    . SHOULDER ARTHROSCOPY DISTAL CLAVICLE EXCISION AND OPEN ROTATOR CUFF REPAIR Right 11/2013  . TONSILLECTOMY  1950's  . TOTAL KNEE ARTHROPLASTY Left 10/13/2014   Procedure: LEFT TOTAL KNEE ARTHROPLASTY;  Surgeon: Debra Rossetti, MD;  Location: [facility];   Service: Orthopedics;  Laterality: Left;   Social History   Occupational History  . Not on file  Tobacco Use  . Smoking status: Never Smoker  . Smokeless tobacco: Never Used  Substance and Sexual Activity  . Alcohol use: Yes    Comment: 10/14/2014 "might have a drink when I'm out once/month"  . Drug use: No  . Sexual activity: Not Currently

## 2017-10-09 ENCOUNTER — Other Ambulatory Visit: Payer: Self-pay | Admitting: Adult Health

## 2017-10-09 DIAGNOSIS — Z760 Encounter for issue of repeat prescription: Secondary | ICD-10-CM

## 2017-10-09 DIAGNOSIS — G4700 Insomnia, unspecified: Secondary | ICD-10-CM

## 2017-10-09 NOTE — Telephone Encounter (Signed)
Should not need until next week.  Would hold for primary

## 2017-10-09 NOTE — Telephone Encounter (Signed)
Last filled on 09/14/17 #30.  Please advise.

## 2017-10-15 NOTE — Telephone Encounter (Signed)
Ok to refill for 30 days  

## 2017-10-16 ENCOUNTER — Other Ambulatory Visit: Payer: Self-pay | Admitting: Adult Health

## 2017-10-16 DIAGNOSIS — G4700 Insomnia, unspecified: Secondary | ICD-10-CM

## 2017-10-16 DIAGNOSIS — Z760 Encounter for issue of repeat prescription: Secondary | ICD-10-CM

## 2017-10-16 NOTE — Telephone Encounter (Signed)
Called to the pharmacy and left on machine. 

## 2017-10-16 NOTE — Telephone Encounter (Signed)
DUPLICATE REQUEST.  CALLED IN AND LEFT ON MACHINE 10/16/2017.

## 2017-10-26 DIAGNOSIS — Z1231 Encounter for screening mammogram for malignant neoplasm of breast: Secondary | ICD-10-CM | POA: Diagnosis not present

## 2017-11-01 ENCOUNTER — Ambulatory Visit: Payer: Medicare Other | Admitting: Pulmonary Disease

## 2017-11-06 ENCOUNTER — Other Ambulatory Visit: Payer: Self-pay | Admitting: Adult Health

## 2017-11-06 DIAGNOSIS — Z760 Encounter for issue of repeat prescription: Secondary | ICD-10-CM

## 2017-11-06 DIAGNOSIS — G4700 Insomnia, unspecified: Secondary | ICD-10-CM

## 2017-11-12 ENCOUNTER — Ambulatory Visit (INDEPENDENT_AMBULATORY_CARE_PROVIDER_SITE_OTHER): Payer: Medicare Other | Admitting: Physician Assistant

## 2017-11-12 ENCOUNTER — Encounter (INDEPENDENT_AMBULATORY_CARE_PROVIDER_SITE_OTHER): Payer: Self-pay | Admitting: Physician Assistant

## 2017-11-12 DIAGNOSIS — M7661 Achilles tendinitis, right leg: Secondary | ICD-10-CM

## 2017-11-12 NOTE — Progress Notes (Signed)
HPI Ms. Bushong returns today for follow-up of her right Achilles tendinitis.  She states pain is much better shoes to use Voltaren gel and stretching exercises.  She also put an extra lift in her shoe which seemed to help.  States she is really having no pain in the right Achilles at this point in time.  In regards to her right knee which she has osteoarthritis she states the injections helped she has some good days and some bad days.  She is asking about injections versus surgery.  She does have a trip planned in November to [place].  Is wondering if she could get a cortisone injection prior  to going on the trip.  Physical exam: Right calf supple.  Tendinopathy changes of the right Achilles Achilles is intact.  She has some tenderness over the lateral insertion of the Achilles but otherwise nontender.  There is no erythema or skin breakdown.  Impression: Right Achilles tendinitis improved  Right knee osteoarthritis Plan: Continue stretching.  Continue Voltaren gel to the Achilles tendon.  In regards to the knee she understands she can have cortisone injections as often as every 3 months.  We will try to give her an injection just prior to her trip in November.  She will follow with [place] on an as-needed basis.  Questions encouraged and answered

## 2017-11-13 ENCOUNTER — Telehealth: Payer: Self-pay | Admitting: Adult Health

## 2017-11-13 NOTE — Telephone Encounter (Signed)
Ok to fill a day or two early

## 2017-11-13 NOTE — Telephone Encounter (Signed)
Copied from [place] [id number]. Topic: Quick Communication - See Telephone Encounter >> Nov 13, 2017  1:10 PM Ether Griffins B wrote: CRM for notification. See Telephone encounter for:  Dorothea Ogle with [facility] calling in regards to the Eszopiclone it has a note to do not fill until 11/16/17. She will be out of the medication a day if this is the case. They are needing an ok to go ahead and fill it a day or two ahead. Call back number [phone].  11/13/17.

## 2017-11-14 NOTE — Telephone Encounter (Signed)
Tried to reach the pharmacy.  Received a message that they do not open until 10 AM.  Will try again later.

## 2017-11-15 DIAGNOSIS — M4726 Other spondylosis with radiculopathy, lumbar region: Secondary | ICD-10-CM | POA: Diagnosis not present

## 2017-11-15 DIAGNOSIS — M5136 Other intervertebral disc degeneration, lumbar region: Secondary | ICD-10-CM | POA: Diagnosis not present

## 2017-11-15 DIAGNOSIS — M48061 Spinal stenosis, lumbar region without neurogenic claudication: Secondary | ICD-10-CM | POA: Diagnosis not present

## 2017-11-15 NOTE — Telephone Encounter (Signed)
Spoke to [place] at the pharmacy and advised that rx can be filled early.

## 2017-12-04 ENCOUNTER — Encounter: Payer: Self-pay | Admitting: Pulmonary Disease

## 2017-12-04 ENCOUNTER — Ambulatory Visit (INDEPENDENT_AMBULATORY_CARE_PROVIDER_SITE_OTHER): Payer: Medicare Other | Admitting: Pulmonary Disease

## 2017-12-04 DIAGNOSIS — G4733 Obstructive sleep apnea (adult) (pediatric): Secondary | ICD-10-CM | POA: Diagnosis not present

## 2017-12-04 NOTE — Progress Notes (Signed)
   Subjective:    Patient ID: Debra Wright, female    DOB: 1951-08-09, 67 y.o.   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 She has  sleep onset insomnia for which she has taken multiple medications over the past few years including Ambien, Benadryl, Tylenol PM and melatonin. She has been maintained on Lunesta 3 mg since 2014  CPAP with full facemask is working well for her.  Her weight is mostly unchanged.  Denies problems with mask or pressure. She has arthritic nodules on her hands and wonders if she would do better with a strap with magnetic clasp. Download was reviewed which shows excellent control of events on auto settings 10-18 cm with average pressure of 15 cm with mild leak and good compliance about 6.5 hours every night  Significant tests/ events reviewed  HST  03/2017  severe OSA with 35 events per hour  Past Medical History:  Diagnosis Date  . Anxiety   . Arthritis    "knees; right thumb" (10/14/2014)  . Basal cell carcinoma    "burned off upper lip & right shoulder"  . Depression   . Fibroid tumor   . Frequent diarrhea   . GERD (gastroesophageal reflux disease)   . High cholesterol   . Hypertension   . Insomnia   . Migraine    "frequently when I was younger; maybe monthly now" (10/14/2014)  . Pernicious anemia   . Type II diabetes mellitus ([place])   . Vitamin B 12 deficiency      Review of Systems Patient denies significant dyspnea,cough, hemoptysis,  chest pain, palpitations, pedal edema, orthopnea, paroxysmal nocturnal dyspnea, lightheadedness, nausea, vomiting, abdominal or  leg pains      Objective:   Physical Exam  Gen. Pleasant, obese, in no distress ENT - no lesions, no post nasal drip Neck: No JVD, no thyromegaly, no carotid bruits Lungs: no use of accessory muscles, no dullness to percussion, decreased without rales or rhonchi  Cardiovascular: Rhythm regular, heart sounds  normal, no murmurs or gallops, no peripheral  edema Musculoskeletal: No deformities, no cyanosis or clubbing , no tremors       Assessment & Plan:

## 2017-12-04 NOTE — Assessment & Plan Note (Signed)
CPAP supplies will be renewed -new mask with magnetic straps Auto settings are working well 10-18 cm  Weight loss encouraged, compliance with goal of at least 4-6 hrs every night is the expectation. Advised against medications with sedative side effects Cautioned against driving when sleepy - understanding that sleepiness will vary on a day to day basis

## 2017-12-04 NOTE — Patient Instructions (Signed)
CPAP supplies will be renewed  Auto settings are working well

## 2017-12-17 ENCOUNTER — Other Ambulatory Visit: Payer: Self-pay | Admitting: Adult Health

## 2017-12-17 ENCOUNTER — Telehealth: Payer: Self-pay | Admitting: Pulmonary Disease

## 2017-12-17 DIAGNOSIS — G4733 Obstructive sleep apnea (adult) (pediatric): Secondary | ICD-10-CM

## 2017-12-17 DIAGNOSIS — Z760 Encounter for issue of repeat prescription: Secondary | ICD-10-CM

## 2017-12-17 DIAGNOSIS — G4700 Insomnia, unspecified: Secondary | ICD-10-CM

## 2017-12-17 DIAGNOSIS — M79674 Pain in right toe(s): Secondary | ICD-10-CM | POA: Diagnosis not present

## 2017-12-17 DIAGNOSIS — L03031 Cellulitis of right toe: Secondary | ICD-10-CM | POA: Diagnosis not present

## 2017-12-17 NOTE — Telephone Encounter (Signed)
Order has been placed to [facility] to renew supplies and mask with magnetic straps. Lm to make pt aware.

## 2017-12-18 NOTE — Telephone Encounter (Signed)
Spoke with the pt and notified that order was sent to [facility]  She verbalized understanding and nothing further

## 2017-12-28 DIAGNOSIS — M48062 Spinal stenosis, lumbar region with neurogenic claudication: Secondary | ICD-10-CM | POA: Diagnosis not present

## 2017-12-28 DIAGNOSIS — M5136 Other intervertebral disc degeneration, lumbar region: Secondary | ICD-10-CM | POA: Diagnosis not present

## 2017-12-28 DIAGNOSIS — M47816 Spondylosis without myelopathy or radiculopathy, lumbar region: Secondary | ICD-10-CM | POA: Diagnosis not present

## 2017-12-28 DIAGNOSIS — M5416 Radiculopathy, lumbar region: Secondary | ICD-10-CM | POA: Diagnosis not present

## 2017-12-29 ENCOUNTER — Other Ambulatory Visit: Payer: Self-pay | Admitting: Adult Health

## 2017-12-29 DIAGNOSIS — Z760 Encounter for issue of repeat prescription: Secondary | ICD-10-CM

## 2017-12-29 DIAGNOSIS — E118 Type 2 diabetes mellitus with unspecified complications: Secondary | ICD-10-CM

## 2017-12-31 DIAGNOSIS — B351 Tinea unguium: Secondary | ICD-10-CM | POA: Diagnosis not present

## 2017-12-31 DIAGNOSIS — M79674 Pain in right toe(s): Secondary | ICD-10-CM | POA: Diagnosis not present

## 2017-12-31 DIAGNOSIS — M79675 Pain in left toe(s): Secondary | ICD-10-CM | POA: Diagnosis not present

## 2018-01-01 NOTE — Telephone Encounter (Signed)
Sent to the pharmacy by e-scribe.  Pt scheduled for cpx on 01/11/18.

## 2018-01-07 ENCOUNTER — Ambulatory Visit (INDEPENDENT_AMBULATORY_CARE_PROVIDER_SITE_OTHER): Payer: Medicare Other | Admitting: Physician Assistant

## 2018-01-10 ENCOUNTER — Ambulatory Visit (INDEPENDENT_AMBULATORY_CARE_PROVIDER_SITE_OTHER): Payer: Medicare Other | Admitting: Physician Assistant

## 2018-01-10 ENCOUNTER — Encounter (INDEPENDENT_AMBULATORY_CARE_PROVIDER_SITE_OTHER): Payer: Self-pay | Admitting: Physician Assistant

## 2018-01-10 DIAGNOSIS — M1711 Unilateral primary osteoarthritis, right knee: Secondary | ICD-10-CM | POA: Diagnosis not present

## 2018-01-10 MED ORDER — METHYLPREDNISOLONE ACETATE 40 MG/ML IJ SUSP
40.0000 mg | INTRAMUSCULAR | Status: AC | PRN
  Administered 2018-01-10: 40 mg via INTRA_ARTICULAR

## 2018-01-10 MED ORDER — DICLOFENAC SODIUM 75 MG PO TBEC: 75.0000 mg | DELAYED_RELEASE_TABLET | Freq: Two times a day (BID) | ORAL | 0 refills | Status: DC

## 2018-01-10 MED ORDER — LIDOCAINE HCL 1 % IJ SOLN
3.0000 mL | INTRAMUSCULAR | Status: AC | PRN
  Administered 2018-01-10: 3 mL

## 2018-01-10 NOTE — Progress Notes (Signed)
   Procedure Note  Patient: Debra Wright             Date of Birth: 09/24/1951           MRN: [id number]             Visit Date: 01/10/2018  HPI: Debra Wright returns today requesting injection in her right knee.  She states knee is bothering whenever she walks and bends.  She is taking some ibuprofen and also is tried some diclofenac that her husband had.  She feels that the diclofenac works better for her.  She has known osteoarthritis of her right knee.    Physical exam: General well-developed well-nourished female in no acute distress mood affect appropriate. Right knee no effusion abnormal warmth erythema has full extension and flexion limited beyond 90 degrees.  No instability.    Procedures: Visit Diagnoses: Primary osteoarthritis of right knee  Large Joint Inj: R knee on 01/10/2018 4:04 PM Indications: pain Details: 22 G 1.5 in needle, anterolateral approach  Arthrogram: No  Medications: 3 mL lidocaine 1 %; 40 mg methylPREDNISolone acetate 40 MG/ML Outcome: tolerated well, no immediate complications Procedure, treatment alternatives, risks and benefits explained, specific risks discussed. Consent was given by the patient. Immediately prior to procedure a time out was called to verify the correct patient, procedure, equipment, support staff and site/side marked as required. Patient was prepped and draped in the usual sterile fashion.    Plan: She will follow-up with [place] on an as-needed basis.  She understands that she can only have cortisone injections every 3 months.  Did call in some diclofenac for her she is to take this with food.  Questions are encouraged and answered at length.

## 2018-01-11 ENCOUNTER — Other Ambulatory Visit: Payer: Self-pay | Admitting: Adult Health

## 2018-01-11 ENCOUNTER — Encounter: Payer: Medicare Other | Admitting: Adult Health

## 2018-01-11 DIAGNOSIS — Z760 Encounter for issue of repeat prescription: Secondary | ICD-10-CM

## 2018-01-11 NOTE — Telephone Encounter (Signed)
Sent to the pharmacy by e-scribe for 90 days.  Pt has upcoming cpx on 01/31/18.

## 2018-01-14 ENCOUNTER — Other Ambulatory Visit: Payer: Self-pay | Admitting: Adult Health

## 2018-01-14 DIAGNOSIS — Z760 Encounter for issue of repeat prescription: Secondary | ICD-10-CM

## 2018-01-14 DIAGNOSIS — G4700 Insomnia, unspecified: Secondary | ICD-10-CM

## 2018-01-15 NOTE — Telephone Encounter (Signed)
SHOULD HAVE REFILLS OF FILE OF BOTH MEDICATIONS.  MESSAGE SENT TO THE PHARMACY.

## 2018-01-18 ENCOUNTER — Other Ambulatory Visit: Payer: Self-pay | Admitting: Adult Health

## 2018-01-18 ENCOUNTER — Other Ambulatory Visit: Payer: Self-pay

## 2018-01-18 ENCOUNTER — Telehealth: Payer: Self-pay | Admitting: Adult Health

## 2018-01-18 MED ORDER — ATORVASTATIN CALCIUM 40 MG PO TABS: 40.0000 mg | ORAL_TABLET | Freq: Every day | ORAL | 0 refills | Status: DC

## 2018-01-18 NOTE — Telephone Encounter (Signed)
Copied from [place] [id number]. Topic: Quick Communication - Rx Refill/Question >> Jan 18, 2018 12:59 PM Neva Seat wrote: atorvastatin (LIPITOR) 40 MG tablet Eszopiclone 3 MG TABS  Pt called on Tues to ask for refill -  Pt is going with out Rx for at least 5 days.  [facility] [address] [place], [place] - [address] [place] [address] Phone: [phone] Fax: [phone]

## 2018-01-18 NOTE — Telephone Encounter (Signed)
Instructed [facility] that pt.'s Eszopiclone was sent to [facility] 12/19/17 with refills.

## 2018-01-22 NOTE — Telephone Encounter (Signed)
Last filled on 01/18/18 for #30.

## 2018-01-30 DIAGNOSIS — M79674 Pain in right toe(s): Secondary | ICD-10-CM | POA: Diagnosis not present

## 2018-01-30 DIAGNOSIS — B351 Tinea unguium: Secondary | ICD-10-CM | POA: Diagnosis not present

## 2018-01-31 ENCOUNTER — Encounter: Payer: Self-pay | Admitting: Adult Health

## 2018-01-31 ENCOUNTER — Ambulatory Visit (INDEPENDENT_AMBULATORY_CARE_PROVIDER_SITE_OTHER): Payer: Medicare Other | Admitting: Adult Health

## 2018-01-31 VITALS — BP 134/60 | Temp 98.3°F | Ht 62.0 in | Wt 223.0 lb

## 2018-01-31 DIAGNOSIS — E119 Type 2 diabetes mellitus without complications: Secondary | ICD-10-CM

## 2018-01-31 DIAGNOSIS — F329 Major depressive disorder, single episode, unspecified: Secondary | ICD-10-CM | POA: Diagnosis not present

## 2018-01-31 DIAGNOSIS — G4700 Insomnia, unspecified: Secondary | ICD-10-CM | POA: Diagnosis not present

## 2018-01-31 DIAGNOSIS — F32A Depression, unspecified: Secondary | ICD-10-CM

## 2018-01-31 DIAGNOSIS — E785 Hyperlipidemia, unspecified: Secondary | ICD-10-CM | POA: Diagnosis not present

## 2018-01-31 DIAGNOSIS — G4730 Sleep apnea, unspecified: Secondary | ICD-10-CM | POA: Diagnosis not present

## 2018-01-31 DIAGNOSIS — F419 Anxiety disorder, unspecified: Secondary | ICD-10-CM | POA: Diagnosis not present

## 2018-01-31 DIAGNOSIS — I10 Essential (primary) hypertension: Secondary | ICD-10-CM

## 2018-01-31 LAB — CBC WITH DIFFERENTIAL/PLATELET
Basophils Absolute: 0 10*3/uL (ref 0.0–0.1)
Basophils Relative: 0.6 % (ref 0.0–3.0)
EOS PCT: 3.2 % (ref 0.0–5.0)
Eosinophils Absolute: 0.2 10*3/uL (ref 0.0–0.7)
HEMATOCRIT: 37.1 % (ref 36.0–46.0)
Hemoglobin: 12.1 g/dL (ref 12.0–15.0)
LYMPHS ABS: 2.4 10*3/uL (ref 0.7–4.0)
Lymphocytes Relative: 35.2 % (ref 12.0–46.0)
MCHC: 32.7 g/dL (ref 30.0–36.0)
MCV: 81.1 fl (ref 78.0–100.0)
MONOS PCT: 9.9 % (ref 3.0–12.0)
Monocytes Absolute: 0.7 10*3/uL (ref 0.1–1.0)
NEUTROS ABS: 3.4 10*3/uL (ref 1.4–7.7)
NEUTROS PCT: 51.1 % (ref 43.0–77.0)
Platelets: 275 10*3/uL (ref 150.0–400.0)
RBC: 4.58 Mil/uL (ref 3.87–5.11)
RDW: 16 % — ABNORMAL HIGH (ref 11.5–15.5)
WBC: 6.7 10*3/uL (ref 4.0–10.5)

## 2018-01-31 LAB — HEPATIC FUNCTION PANEL
ALBUMIN: 3.9 g/dL (ref 3.5–5.2)
ALK PHOS: 59 U/L (ref 39–117)
ALT: 20 U/L (ref 0–35)
AST: 19 U/L (ref 0–37)
Bilirubin, Direct: 0.1 mg/dL (ref 0.0–0.3)
TOTAL PROTEIN: 6 g/dL (ref 6.0–8.3)
Total Bilirubin: 0.6 mg/dL (ref 0.2–1.2)

## 2018-01-31 LAB — TSH: TSH: 2.17 u[IU]/mL (ref 0.35–4.50)

## 2018-01-31 LAB — LDL CHOLESTEROL, DIRECT: LDL DIRECT: 42 mg/dL

## 2018-01-31 LAB — HEMOGLOBIN A1C: Hgb A1c MFr Bld: 8.3 % — ABNORMAL HIGH (ref 4.6–6.5)

## 2018-01-31 LAB — LIPID PANEL
Cholesterol: 100 mg/dL (ref 0–200)
HDL: 34.8 mg/dL — AB (ref >39.00)
NonHDL: 64.96
Total CHOL/HDL Ratio: 3
Triglycerides: 231 mg/dL — ABNORMAL HIGH (ref 0.0–149.0)
VLDL: 46.2 mg/dL — ABNORMAL HIGH (ref 0.0–40.0)

## 2018-01-31 LAB — BASIC METABOLIC PANEL
BUN: 10 mg/dL (ref 6–23)
CO2: 27 meq/L (ref 19–32)
Calcium: 8.9 mg/dL (ref 8.4–10.5)
Chloride: 103 mEq/L (ref 96–112)
Creatinine, Ser: 0.52 mg/dL (ref 0.40–1.20)
GFR: 124.21 mL/min (ref >60.00)
GLUCOSE: 111 mg/dL — AB (ref 70–99)
POTASSIUM: 4 meq/L (ref 3.5–5.1)
SODIUM: 140 meq/L (ref 135–145)

## 2018-01-31 LAB — MICROALBUMIN / CREATININE URINE RATIO
Creatinine,U: 146 mg/dL
MICROALB UR: 1.1 mg/dL (ref 0.0–1.9)
Microalb Creat Ratio: 0.8 mg/g (ref 0.0–30.0)

## 2018-01-31 NOTE — Progress Notes (Signed)
Subjective:    Patient ID: Debra Wright, female    DOB: Jun 14, 1951, 67 y.o.   MRN: [id number]  HPI  Patient presents for yearly preventative medicine examination. He is a pleasant 67 year old female who  has a past medical history of Anxiety, Arthritis, Basal cell carcinoma, Depression, Fibroid tumor, Frequent diarrhea, GERD (gastroesophageal reflux disease), High cholesterol, Hypertension, Insomnia, Migraine, Pernicious anemia, Type II diabetes mellitus ([place]), and Vitamin B 12 deficiency.    DM  -Takes metformin 1000 mg twice daily.  She denies any hypoglycemic events.  Is monitoring her blood sugars at home and reports readings consistently in the 130-140's. She has had a reading of 200 once ( during easter)  Lab Results  Component Value Date   HGBA1C 7.4 09/20/2017   Hyperlipidemia  -Takes Lipitor 40 mg daily and [facility] Lab Results  Component Value Date   CHOL 141 01/10/2017   HDL 31.80 (L) 01/10/2017   LDLDIRECT 47.0 01/10/2017   TRIG (H) 01/10/2017    560.0 Triglyceride is over 400; calculations on Lipids are invalid.   CHOLHDL 4 01/10/2017   Essential Hypertension  -Controlled with Norvasc 10 mg BP Readings from Last 3 Encounters:  01/31/18 134/60  12/04/17 126/78  09/20/17 124/70   Depression  -Controlled with Celexa  Insomnia  -Lunesta 3 mg  GERD  -Controlled with omeprazole 40 mg daily  OSA  -followed by pulmonary.  She has great compliance with CPAP  Arthritis - Has had cortisone shots in right knee ,left elbow, and back. This is done by orthopedics.   All immunizations and health maintenance protocols were reviewed with the patient and needed orders were placed.  Appropriate screening laboratory values were ordered for the patient including screening of hyperlipidemia, renal function and hepatic function.   Medication reconciliation,  past medical history, social history, problem list and allergies were reviewed in detail with the patient  Goals were  established with regard to weight loss, exercise, and  diet in compliance with medications  End of life planning was discussed. She has an advanced directive and living will.   She is up-to-date on health maintenance issues such as colonoscopy, mammogram, DEXA scan, vision and dental screens.  She does home breast exams and has not noticed any changes  Review of Systems  Constitutional: Negative.   HENT: Negative.   Eyes: Negative.   Respiratory: Negative.   Cardiovascular: Negative.   Gastrointestinal: Negative.   Endocrine: Negative.   Genitourinary: Negative.   Musculoskeletal: Positive for arthralgias.  Skin: Negative.   Allergic/Immunologic: Negative.   Neurological: Negative.   Hematological: Negative.   Psychiatric/Behavioral: Negative.    Past Medical History:  Diagnosis Date  . Anxiety   . Arthritis    "knees; right thumb" (10/14/2014)  . Basal cell carcinoma    "burned off upper lip & right shoulder"  . Depression   . Fibroid tumor   . Frequent diarrhea   . GERD (gastroesophageal reflux disease)   . High cholesterol   . Hypertension   . Insomnia   . Migraine    "frequently when I was younger; maybe monthly now" (10/14/2014)  . Pernicious anemia   . Type II diabetes mellitus ([place])   . Vitamin B 12 deficiency     Social History   Socioeconomic History  . Marital status: Married    Spouse name: Not on file  . Number of children: Not on file  . Years of education: Not on file  . Highest education level:  Not on file  Occupational History  . Not on file  Social Needs  . Financial resource strain: Not on file  . Food insecurity:    Worry: Not on file    Inability: Not on file  . Transportation needs:    Medical: Not on file    Non-medical: Not on file  Tobacco Use  . Smoking status: Never Smoker  . Smokeless tobacco: Never Used  Substance and Sexual Activity  . Alcohol use: Yes    Comment: 10/14/2014 "might have a drink when I'm out once/month"  .  Drug use: No  . Sexual activity: Not Currently  Lifestyle  . Physical activity:    Days per week: Not on file    Minutes per session: Not on file  . Stress: Not on file  Relationships  . Social connections:    Talks on phone: Not on file    Gets together: Not on file    Attends religious service: Not on file    Active member of club or organization: Not on file    Attends meetings of clubs or organizations: Not on file    Relationship status: Not on file  . Intimate partner violence:    Fear of current or ex partner: Not on file    Emotionally abused: Not on file    Physically abused: Not on file    Forced sexual activity: Not on file  Other Topics Concern  . 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 ABDOMINAL HYSTERECTOMY  1988  . ACHILLES TENDON SURGERY Right   . APPENDECTOMY  1988  . BILATERAL OOPHORECTOMY  2004  . CARPAL TUNNEL RELEASE Bilateral 1986  . CATARACT EXTRACTION W/ INTRAOCULAR LENS  IMPLANT, BILATERAL Bilateral 2011  . JOINT REPLACEMENT    . SHOULDER ARTHROSCOPY DISTAL CLAVICLE EXCISION AND OPEN ROTATOR CUFF REPAIR Right 11/2013  . TONSILLECTOMY  1950's  . TOTAL KNEE ARTHROPLASTY Left 10/13/2014   Procedure: LEFT TOTAL KNEE ARTHROPLASTY;  Surgeon: Mcarthur Rossetti, MD;  Location: [facility];  Service: Orthopedics;  Laterality: Left;    Family History  Problem Relation Age of Onset  . Arthritis Mother   . Hyperlipidemia Mother   . Diabetes Mother   . Hypertension Mother   . Arthritis Father   . Hyperlipidemia Father   . Heart disease Father   . Diabetes Father   . Stroke Brother   . Hypertension Brother   . Diabetes Brother   . Arthritis Maternal Grandmother   . Hyperlipidemia Maternal Grandmother   . Diabetes Maternal Grandmother   . Hypertension Maternal Grandmother   . Arthritis Maternal Grandfather   . Hyperlipidemia Maternal  Grandfather   . Hypertension Maternal Grandfather   . Arthritis Paternal Grandmother   . Hyperlipidemia Paternal Grandmother   . Diabetes Paternal Grandmother   . Hypertension Paternal Grandmother   . Arthritis Paternal Grandfather   . Hyperlipidemia Paternal Grandfather   . Diabetes Paternal Grandfather   . Hypertension Paternal Grandfather     Allergies  Allergen Reactions  . Antivert [Meclizine Hcl] Other (See Comments)    "makes me feel like my skin is falling off".    Current Outpatient Medications on File Prior to Visit  Medication Sig Dispense Refill  . amLODipine (NORVASC) 10 MG tablet TAKE ONE TABLET BY MOUTH ONCE DAILY  90 tablet 1  . atorvastatin (LIPITOR) 40 MG tablet Take 1 tablet (40 mg total) by mouth daily. 30 tablet 0  . calcium carbonate (TUMS - DOSED IN MG ELEMENTAL CALCIUM) 500 MG chewable tablet Chew 2 tablets by mouth daily as needed for indigestion or heartburn.    . Calcium Carbonate-Vitamin D (CALCIUM 600 + D PO) Take 1 tablet by mouth daily.    . citalopram (CELEXA) 40 MG tablet Take 1 tablet (40 mg total) by mouth every morning. 90 tablet 3  . diclofenac (VOLTAREN) 75 MG EC tablet Take 1 tablet (75 mg total) by mouth 2 (two) times daily. 60 tablet 0  . diclofenac sodium (VOLTAREN) 1 % GEL Apply 4 g topically 4 (four) times daily. Apply to R knee and R heel 1 Tube 0  . Eszopiclone 3 MG TABS TAKE ONE TABLET BY MOUTH AT BEDTIME  30 tablet 2  . ibuprofen (ADVIL,MOTRIN) 200 MG tablet Take 400 mg by mouth every 6 (six) hours as needed for moderate pain.    . metFORMIN (GLUCOPHAGE) 1000 MG tablet TAKE 1 TABLET BY MOUTH TWICE DAILY WITH MEALS 180 tablet 0  . omega-3 acid ethyl esters (LOVAZA) 1 G capsule Take 2 g by mouth 2 (two) times daily.    Marland Kitchen omeprazole (PRILOSEC) 40 MG capsule TAKE ONE CAPSULE BY MOUTH ONCE DAILY IN THE MORNING 90 capsule 1  . terbinafine (LAMISIL) 250 MG tablet      No current facility-administered medications on file prior to visit.      BP 134/60   Temp 98.3 F (36.8 C) (Oral)   Ht 5\' 2"  (1.575 m)   Wt 223 lb (101.2 kg)   BMI 40.79 kg/m       Objective:   Physical Exam  Constitutional: She is oriented to person, place, and time. She appears well-developed and well-nourished. No distress.  HENT:  Head: Normocephalic and atraumatic.  Right Ear: External ear normal.  Left Ear: External ear normal.  Nose: Nose normal.  Mouth/Throat: Oropharynx is clear and moist. No oropharyngeal exudate.  Eyes: Pupils are equal, round, and reactive to light. Conjunctivae and EOM are normal. Right eye exhibits no discharge. Left eye exhibits no discharge. No scleral icterus.  Neck: Normal range of motion. Neck supple. No JVD present. No tracheal deviation present. No thyromegaly present.  Cardiovascular: Normal rate, regular rhythm, normal heart sounds and intact distal pulses. Exam reveals no gallop and no friction rub.  No murmur heard. Pulmonary/Chest: Effort normal and breath sounds normal. No stridor. No respiratory distress. She has no wheezes. She has no rales. She exhibits no tenderness. Right breast exhibits no inverted nipple, no mass, no nipple discharge, no skin change and no tenderness. Left breast exhibits no inverted nipple, no mass, no nipple discharge, no skin change and no tenderness. No breast swelling, tenderness or discharge. Breasts are symmetrical.  Abdominal: Soft. Bowel sounds are normal. She exhibits no distension and no mass. There is no tenderness. There is no rebound and no guarding. No hernia.  Genitourinary:  Genitourinary Comments: Deferred    Musculoskeletal: Normal range of motion. She exhibits no edema, tenderness or deformity.  Lymphadenopathy:    She has no cervical adenopathy.  Neurological: She is alert and oriented to person, place, and time. She displays normal reflexes. No cranial nerve deficit or sensory deficit. She exhibits normal muscle tone. Coordination normal.  Skin: Skin is warm and  dry. Capillary refill takes less than 2 seconds. No rash noted. She is  not diaphoretic. No erythema. No pallor.  Psychiatric: She has a normal mood and affect. Her behavior is normal. Judgment and thought content normal.  Nursing note and vitals reviewed.     Assessment & Plan:  1. Controlled type 2 diabetes mellitus without complication, without long-term current use of insulin ([place]) - Consider adding agent  - Encouraged diet and exercise  - Basic metabolic panel - CBC with Differential/Platelet - Hemoglobin A1c - Hepatic function panel - Lipid panel - TSH - Microalbumin / creatinine urine ratio  2. Insomnia, unspecified type - Continue with Lunesta   3. Anxiety and depression - Continue with Celexa   4. Essential hypertension - Near goal.  - No change in medications  - Basic metabolic panel - CBC with Differential/Platelet - Hemoglobin A1c - Hepatic function panel - Lipid panel - TSH  5. Hyperlipidemia, unspecified hyperlipidemia type - Consider increase in statin  - Basic metabolic panel - CBC with Differential/Platelet - Hemoglobin A1c - Hepatic function panel - Lipid panel - TSH  6. Sleep apnea, unspecified type - Continue with POC by pulmonary  - Reports being able to sleep better.   Dorothyann Peng, NP

## 2018-02-05 ENCOUNTER — Other Ambulatory Visit: Payer: Self-pay | Admitting: Family Medicine

## 2018-02-05 ENCOUNTER — Telehealth: Payer: Self-pay | Admitting: *Deleted

## 2018-02-05 MED ORDER — GLIPIZIDE ER 5 MG PO TB24: 5.0000 mg | ORAL_TABLET | Freq: Every day | ORAL | 0 refills | Status: DC

## 2018-02-05 NOTE — Telephone Encounter (Signed)
Please advise 

## 2018-02-05 NOTE — Telephone Encounter (Signed)
Copied from [facility] [id number]. Topic: General - Other >> Feb 05, 2018 12:02 PM Yvette Rack wrote: Reason for CRM: Pt called in requesting name of psychiatrist that Dr. Dorothyann Peng spoke with her about. Pt request a call back.

## 2018-02-06 ENCOUNTER — Other Ambulatory Visit: Payer: Self-pay | Admitting: Adult Health

## 2018-02-06 DIAGNOSIS — F419 Anxiety disorder, unspecified: Principal | ICD-10-CM

## 2018-02-06 DIAGNOSIS — F329 Major depressive disorder, single episode, unspecified: Secondary | ICD-10-CM

## 2018-02-06 DIAGNOSIS — F32A Depression, unspecified: Secondary | ICD-10-CM

## 2018-02-06 NOTE — Telephone Encounter (Signed)
No answer; left VM to call back regarding psychiatry

## 2018-02-07 ENCOUNTER — Ambulatory Visit (INDEPENDENT_AMBULATORY_CARE_PROVIDER_SITE_OTHER): Payer: Medicare Other

## 2018-02-07 ENCOUNTER — Ambulatory Visit (INDEPENDENT_AMBULATORY_CARE_PROVIDER_SITE_OTHER): Payer: Medicare Other | Admitting: Physician Assistant

## 2018-02-07 ENCOUNTER — Encounter (INDEPENDENT_AMBULATORY_CARE_PROVIDER_SITE_OTHER): Payer: Self-pay | Admitting: Physician Assistant

## 2018-02-07 DIAGNOSIS — M7062 Trochanteric bursitis, left hip: Secondary | ICD-10-CM | POA: Diagnosis not present

## 2018-02-07 DIAGNOSIS — M25552 Pain in left hip: Secondary | ICD-10-CM

## 2018-02-07 MED ORDER — METHYLPREDNISOLONE ACETATE 40 MG/ML IJ SUSP
40.0000 mg | INTRAMUSCULAR | Status: AC | PRN
  Administered 2018-02-07: 40 mg via INTRA_ARTICULAR

## 2018-02-07 MED ORDER — LIDOCAINE HCL 1 % IJ SOLN
3.0000 mL | INTRAMUSCULAR | Status: AC | PRN
  Administered 2018-02-07: 3 mL

## 2018-02-07 NOTE — Progress Notes (Signed)
Office Visit Note   Patient: Debra Wright           Date of Birth: 08-Jan-1951           MRN: [id number] Visit Date: 02/07/2018              Requested by: Dorothyann Peng, NP [place], [place] [address] PCP: Dorothyann Peng, NP   Assessment & Plan: Visit Diagnoses:  1. Pain in left hip   2. Trochanteric bursitis, left hip     Plan: IT band stretching exercises are shown.  She will follow-up with [place] on as-needed basis pain persist or becomes worse.  Questions encouraged and answered  Follow-Up Instructions: Return if symptoms worsen or fail to improve.   Orders:  Orders Placed This Encounter  Procedures  . Large Joint Inj  . XR HIP UNILAT W OR W/O PELVIS 2-3 VIEWS LEFT   No orders of the defined types were placed in this encounter.     Procedures: Large Joint Inj: L greater trochanter on 02/07/2018 2:14 PM Indications: pain Details: 22 G 1.5 in needle, lateral approach  Arthrogram: No  Medications: 3 mL lidocaine 1 %; 40 mg methylPREDNISolone acetate 40 MG/ML Outcome: tolerated well, no immediate complications Procedure, treatment alternatives, risks and benefits explained, specific risks discussed. Consent was given by the patient. Immediately prior to procedure a time out was called to verify the correct patient, procedure, equipment, support staff and site/side marked as required. Patient was prepped and draped in the usual sterile fashion.       Clinical Data: No additional findings.   Subjective: Chief Complaint  Patient presents with  . Left Hip - Pain    HPI Mrs. Furgeson returns today for a new complaint of left hip pain.  Pain is worse whenever she tries to lie on the left hip.  She is tried ibuprofen without much relief.  She has an area of tenderness over the lateral aspect of the hip.  No radicular symptoms down the leg.  No groin pain.  She had no known injury.  No fevers chills shortness of breath chest pain. Review of Systems Please see  HPI otherwise negative  Objective: Vital Signs: There were no vitals taken for this visit.  Physical Exam  Constitutional: She is oriented to person, place, and time. She appears well-developed and well-nourished. No distress.  Pulmonary/Chest: Effort normal.  Neurological: She is alert and oriented to person, place, and time.  Skin: She is not diaphoretic.  Psychiatric: She has a normal mood and affect. Her behavior is normal.    Ortho Exam Bilateral hips good range of motion without pain.  She has tenderness over the left trochanteric region.  No tenderness over the right.  Left knee well-healed surgical incision.  Good range of motion left knee without pain. Specialty Comments:  No specialty comments available.  Imaging: Xr Hip Unilat W Or W/o Pelvis 2-3 Views Left  Result Date: 02/07/2018 AP pelvis and lateral view of the left hip: Bilateral hips are well located.  No acute fractures.  Hip joints are well-preserved.    PMFS History: Patient Active Problem List   Diagnosis Date Noted  . Unilateral primary osteoarthritis, right knee 06/14/2017  . OSA (obstructive sleep apnea) 02/13/2017  . Controlled type 2 diabetes mellitus without complication, without long-term current use of insulin ([place]) 10/31/2016  . Essential hypertension 08/02/2016  . Anxiety and depression 08/02/2016  . Insomnia 08/02/2016  . Arthritis of left knee 10/13/2014  .  Status post total left knee replacement 10/13/2014   Past Medical History:  Diagnosis Date  . Anxiety   . Arthritis    "knees; right thumb" (10/14/2014)  . Basal cell carcinoma    "burned off upper lip & right shoulder"  . Depression   . Fibroid tumor   . Frequent diarrhea   . GERD (gastroesophageal reflux disease)   . High cholesterol   . Hypertension   . Insomnia   . Migraine    "frequently when I was younger; maybe monthly now" (10/14/2014)  . Pernicious anemia   . Type II diabetes mellitus ([place])   . Vitamin B 12 deficiency       Family History  Problem Relation Age of Onset  . Arthritis Mother   . Hyperlipidemia Mother   . Diabetes Mother   . Hypertension Mother   . Arthritis Father   . Hyperlipidemia Father   . Heart disease Father   . Diabetes Father   . Stroke Brother   . Hypertension Brother   . Diabetes Brother   . Arthritis Maternal Grandmother   . Hyperlipidemia Maternal Grandmother   . Diabetes Maternal Grandmother   . Hypertension Maternal Grandmother   . Arthritis Maternal Grandfather   . Hyperlipidemia Maternal Grandfather   . Hypertension Maternal Grandfather   . Arthritis Paternal Grandmother   . Hyperlipidemia Paternal Grandmother   . Diabetes Paternal Grandmother   . Hypertension Paternal Grandmother   . Arthritis Paternal Grandfather   . Hyperlipidemia Paternal Grandfather   . Diabetes Paternal Grandfather   . Hypertension Paternal Grandfather     Past Surgical History:  Procedure Laterality Date  . ABDOMINAL HYSTERECTOMY  1988  . ACHILLES TENDON SURGERY Right   . APPENDECTOMY  1988  . BILATERAL OOPHORECTOMY  2004  . CARPAL TUNNEL RELEASE Bilateral 1986  . CATARACT EXTRACTION W/ INTRAOCULAR LENS  IMPLANT, BILATERAL Bilateral 2011  . JOINT REPLACEMENT    . SHOULDER ARTHROSCOPY DISTAL CLAVICLE EXCISION AND OPEN ROTATOR CUFF REPAIR Right 11/2013  . TONSILLECTOMY  1950's  . TOTAL KNEE ARTHROPLASTY Left 10/13/2014   Procedure: LEFT TOTAL KNEE ARTHROPLASTY;  Surgeon: Mcarthur Rossetti, MD;  Location: [facility];  Service: Orthopedics;  Laterality: Left;   Social History   Occupational History  . Not on file  Tobacco Use  . Smoking status: Never Smoker  . Smokeless tobacco: Never Used  Substance and Sexual Activity  . Alcohol use: Yes    Comment: 10/14/2014 "might have a drink when I'm out once/month"  . Drug use: No  . Sexual activity: Not Currently

## 2018-02-16 ENCOUNTER — Other Ambulatory Visit (INDEPENDENT_AMBULATORY_CARE_PROVIDER_SITE_OTHER): Payer: Self-pay | Admitting: Physician Assistant

## 2018-02-16 ENCOUNTER — Other Ambulatory Visit: Payer: Self-pay | Admitting: Adult Health

## 2018-02-19 ENCOUNTER — Encounter: Payer: Self-pay | Admitting: Adult Health

## 2018-02-19 NOTE — Telephone Encounter (Signed)
Sent to the pharmacy by e-scribe.  Annual visit and labs last done 01/31/18.

## 2018-02-28 DIAGNOSIS — M5136 Other intervertebral disc degeneration, lumbar region: Secondary | ICD-10-CM | POA: Diagnosis not present

## 2018-02-28 DIAGNOSIS — M4726 Other spondylosis with radiculopathy, lumbar region: Secondary | ICD-10-CM | POA: Diagnosis not present

## 2018-02-28 DIAGNOSIS — M48061 Spinal stenosis, lumbar region without neurogenic claudication: Secondary | ICD-10-CM | POA: Diagnosis not present

## 2018-03-03 ENCOUNTER — Other Ambulatory Visit: Payer: Self-pay | Admitting: Adult Health

## 2018-03-03 DIAGNOSIS — Z760 Encounter for issue of repeat prescription: Secondary | ICD-10-CM

## 2018-03-05 NOTE — Telephone Encounter (Signed)
Sent to the pharmacy by e-scribe. 

## 2018-03-06 DIAGNOSIS — M79674 Pain in right toe(s): Secondary | ICD-10-CM | POA: Diagnosis not present

## 2018-03-06 DIAGNOSIS — B351 Tinea unguium: Secondary | ICD-10-CM | POA: Diagnosis not present

## 2018-03-06 DIAGNOSIS — M79675 Pain in left toe(s): Secondary | ICD-10-CM | POA: Diagnosis not present

## 2018-03-20 ENCOUNTER — Other Ambulatory Visit: Payer: Self-pay | Admitting: Adult Health

## 2018-03-20 ENCOUNTER — Telehealth: Payer: Self-pay | Admitting: *Deleted

## 2018-03-20 ENCOUNTER — Other Ambulatory Visit (INDEPENDENT_AMBULATORY_CARE_PROVIDER_SITE_OTHER): Payer: Self-pay | Admitting: Orthopaedic Surgery

## 2018-03-20 DIAGNOSIS — Z760 Encounter for issue of repeat prescription: Secondary | ICD-10-CM

## 2018-03-20 DIAGNOSIS — G4700 Insomnia, unspecified: Secondary | ICD-10-CM

## 2018-03-20 NOTE — Telephone Encounter (Signed)
PA request received for atorvastatin 40 mg tablets.  PA initiated via CoverMyMeds. Received response that medication is available without authorization. Key: [id number]

## 2018-03-21 NOTE — Telephone Encounter (Signed)
Sent to the pharmacy by e-scribe. 

## 2018-04-01 HISTORY — PX: TOENAIL EXCISION: SUR558

## 2018-04-03 DIAGNOSIS — B351 Tinea unguium: Secondary | ICD-10-CM | POA: Diagnosis not present

## 2018-04-03 DIAGNOSIS — M79675 Pain in left toe(s): Secondary | ICD-10-CM | POA: Diagnosis not present

## 2018-04-03 DIAGNOSIS — M79674 Pain in right toe(s): Secondary | ICD-10-CM | POA: Diagnosis not present

## 2018-04-13 ENCOUNTER — Other Ambulatory Visit: Payer: Self-pay | Admitting: Adult Health

## 2018-04-13 DIAGNOSIS — Z760 Encounter for issue of repeat prescription: Secondary | ICD-10-CM

## 2018-04-13 DIAGNOSIS — E118 Type 2 diabetes mellitus with unspecified complications: Secondary | ICD-10-CM

## 2018-04-16 NOTE — Telephone Encounter (Signed)
Sent to the pharmacy by e-scribe for 90 days.  Pt has appt with Tommi Rumps on 05/08/18 for follow up A1C.

## 2018-04-30 ENCOUNTER — Other Ambulatory Visit (INDEPENDENT_AMBULATORY_CARE_PROVIDER_SITE_OTHER): Payer: Self-pay | Admitting: Physician Assistant

## 2018-05-02 ENCOUNTER — Encounter (HOSPITAL_COMMUNITY): Payer: Self-pay | Admitting: Emergency Medicine

## 2018-05-02 ENCOUNTER — Encounter (INDEPENDENT_AMBULATORY_CARE_PROVIDER_SITE_OTHER): Payer: Self-pay | Admitting: Physician Assistant

## 2018-05-02 ENCOUNTER — Other Ambulatory Visit: Payer: Self-pay

## 2018-05-02 ENCOUNTER — Ambulatory Visit (INDEPENDENT_AMBULATORY_CARE_PROVIDER_SITE_OTHER): Payer: Medicare Other | Admitting: Physician Assistant

## 2018-05-02 ENCOUNTER — Observation Stay (HOSPITAL_COMMUNITY)
Admission: EM | Admit: 2018-05-02 | Discharge: 2018-05-04 | Disposition: A | Discharge status: Home / Self Care | Payer: Medicare Other | Attending: Cardiology | Admitting: Cardiology

## 2018-05-02 ENCOUNTER — Emergency Department (HOSPITAL_COMMUNITY): Payer: Medicare Other

## 2018-05-02 DIAGNOSIS — F419 Anxiety disorder, unspecified: Secondary | ICD-10-CM | POA: Insufficient documentation

## 2018-05-02 DIAGNOSIS — M1990 Unspecified osteoarthritis, unspecified site: Secondary | ICD-10-CM | POA: Diagnosis not present

## 2018-05-02 DIAGNOSIS — Z8249 Family history of ischemic heart disease and other diseases of the circulatory system: Secondary | ICD-10-CM | POA: Insufficient documentation

## 2018-05-02 DIAGNOSIS — E119 Type 2 diabetes mellitus without complications: Secondary | ICD-10-CM | POA: Insufficient documentation

## 2018-05-02 DIAGNOSIS — I499 Cardiac arrhythmia, unspecified: Secondary | ICD-10-CM | POA: Diagnosis not present

## 2018-05-02 DIAGNOSIS — Z90710 Acquired absence of both cervix and uterus: Secondary | ICD-10-CM | POA: Insufficient documentation

## 2018-05-02 DIAGNOSIS — I4891 Unspecified atrial fibrillation: Secondary | ICD-10-CM | POA: Diagnosis not present

## 2018-05-02 DIAGNOSIS — G4700 Insomnia, unspecified: Secondary | ICD-10-CM | POA: Diagnosis not present

## 2018-05-02 DIAGNOSIS — I110 Hypertensive heart disease with heart failure: Secondary | ICD-10-CM | POA: Insufficient documentation

## 2018-05-02 DIAGNOSIS — R0789 Other chest pain: Secondary | ICD-10-CM | POA: Diagnosis not present

## 2018-05-02 DIAGNOSIS — Z79899 Other long term (current) drug therapy: Secondary | ICD-10-CM | POA: Insufficient documentation

## 2018-05-02 DIAGNOSIS — I420 Dilated cardiomyopathy: Secondary | ICD-10-CM | POA: Insufficient documentation

## 2018-05-02 DIAGNOSIS — Z85828 Personal history of other malignant neoplasm of skin: Secondary | ICD-10-CM | POA: Insufficient documentation

## 2018-05-02 DIAGNOSIS — R079 Chest pain, unspecified: Secondary | ICD-10-CM

## 2018-05-02 DIAGNOSIS — Z823 Family history of stroke: Secondary | ICD-10-CM | POA: Insufficient documentation

## 2018-05-02 DIAGNOSIS — M1711 Unilateral primary osteoarthritis, right knee: Secondary | ICD-10-CM | POA: Diagnosis not present

## 2018-05-02 DIAGNOSIS — I480 Paroxysmal atrial fibrillation: Principal | ICD-10-CM | POA: Insufficient documentation

## 2018-05-02 DIAGNOSIS — I10 Essential (primary) hypertension: Secondary | ICD-10-CM | POA: Diagnosis present

## 2018-05-02 DIAGNOSIS — Z833 Family history of diabetes mellitus: Secondary | ICD-10-CM | POA: Insufficient documentation

## 2018-05-02 DIAGNOSIS — F329 Major depressive disorder, single episode, unspecified: Secondary | ICD-10-CM | POA: Insufficient documentation

## 2018-05-02 DIAGNOSIS — K219 Gastro-esophageal reflux disease without esophagitis: Secondary | ICD-10-CM | POA: Insufficient documentation

## 2018-05-02 DIAGNOSIS — I5030 Unspecified diastolic (congestive) heart failure: Secondary | ICD-10-CM | POA: Insufficient documentation

## 2018-05-02 DIAGNOSIS — Z9842 Cataract extraction status, left eye: Secondary | ICD-10-CM | POA: Insufficient documentation

## 2018-05-02 DIAGNOSIS — Z8261 Family history of arthritis: Secondary | ICD-10-CM | POA: Insufficient documentation

## 2018-05-02 DIAGNOSIS — Z90722 Acquired absence of ovaries, bilateral: Secondary | ICD-10-CM | POA: Insufficient documentation

## 2018-05-02 DIAGNOSIS — I471 Supraventricular tachycardia: Secondary | ICD-10-CM | POA: Diagnosis not present

## 2018-05-02 DIAGNOSIS — G4733 Obstructive sleep apnea (adult) (pediatric): Secondary | ICD-10-CM | POA: Diagnosis not present

## 2018-05-02 DIAGNOSIS — G43909 Migraine, unspecified, not intractable, without status migrainosus: Secondary | ICD-10-CM | POA: Diagnosis not present

## 2018-05-02 DIAGNOSIS — Z7982 Long term (current) use of aspirin: Secondary | ICD-10-CM | POA: Insufficient documentation

## 2018-05-02 DIAGNOSIS — D510 Vitamin B12 deficiency anemia due to intrinsic factor deficiency: Secondary | ICD-10-CM | POA: Diagnosis not present

## 2018-05-02 DIAGNOSIS — R002 Palpitations: Secondary | ICD-10-CM | POA: Diagnosis not present

## 2018-05-02 DIAGNOSIS — Z96652 Presence of left artificial knee joint: Secondary | ICD-10-CM | POA: Insufficient documentation

## 2018-05-02 DIAGNOSIS — Z9841 Cataract extraction status, right eye: Secondary | ICD-10-CM | POA: Insufficient documentation

## 2018-05-02 DIAGNOSIS — I4892 Unspecified atrial flutter: Secondary | ICD-10-CM | POA: Diagnosis not present

## 2018-05-02 DIAGNOSIS — Z888 Allergy status to other drugs, medicaments and biological substances status: Secondary | ICD-10-CM | POA: Diagnosis not present

## 2018-05-02 DIAGNOSIS — E785 Hyperlipidemia, unspecified: Secondary | ICD-10-CM | POA: Diagnosis not present

## 2018-05-02 DIAGNOSIS — R0602 Shortness of breath: Secondary | ICD-10-CM | POA: Diagnosis not present

## 2018-05-02 DIAGNOSIS — Z9889 Other specified postprocedural states: Secondary | ICD-10-CM | POA: Insufficient documentation

## 2018-05-02 HISTORY — DX: Paroxysmal atrial fibrillation: I48.0

## 2018-05-02 LAB — BASIC METABOLIC PANEL
ANION GAP: 14 (ref 5–15)
BUN: 5 mg/dL — ABNORMAL LOW (ref 8–23)
CALCIUM: 9.1 mg/dL (ref 8.9–10.3)
CO2: 22 mmol/L (ref 22–32)
CREATININE: 0.7 mg/dL (ref 0.44–1.00)
Chloride: 105 mmol/L (ref 98–111)
Glucose, Bld: 255 mg/dL — ABNORMAL HIGH (ref 70–99)
Potassium: 3.6 mmol/L (ref 3.5–5.1)
SODIUM: 141 mmol/L (ref 135–145)

## 2018-05-02 LAB — CBC
HCT: 40.5 % (ref 36.0–46.0)
HEMOGLOBIN: 12.6 g/dL (ref 12.0–15.0)
MCH: 25.9 pg — ABNORMAL LOW (ref 26.0–34.0)
MCHC: 31.1 g/dL (ref 30.0–36.0)
MCV: 83.3 fL (ref 78.0–100.0)
PLATELETS: 303 10*3/uL (ref 150–400)
RBC: 4.86 MIL/uL (ref 3.87–5.11)
RDW: 15.2 % (ref 11.5–15.5)
WBC: 8.7 10*3/uL (ref 4.0–10.5)

## 2018-05-02 LAB — I-STAT TROPONIN, ED
TROPONIN I, POC: 0.01 ng/mL (ref 0.00–0.08)
Troponin i, poc: 0.01 ng/mL (ref 0.00–0.08)

## 2018-05-02 LAB — CBG MONITORING, ED: GLUCOSE-CAPILLARY: 217 mg/dL — AB (ref 70–99)

## 2018-05-02 MED ORDER — SODIUM CHLORIDE 0.9 % IV SOLN: 250.0000 mL | INTRAVENOUS | Status: DC | PRN

## 2018-05-02 MED ORDER — METOPROLOL TARTRATE 5 MG/5ML IV SOLN: 5.0000 mg | INTRAVENOUS | Status: DC | PRN

## 2018-05-02 MED ORDER — METHYLPREDNISOLONE ACETATE 40 MG/ML IJ SUSP
40.0000 mg | INTRAMUSCULAR | Status: AC | PRN
  Administered 2018-05-02: 40 mg via INTRA_ARTICULAR

## 2018-05-02 MED ORDER — DICLOFENAC SODIUM 1 % TD GEL: 4.0000 g | Freq: Four times a day (QID) | TRANSDERMAL | 3 refills | Status: DC

## 2018-05-02 MED ORDER — ASPIRIN EC 81 MG PO TBEC
81.0000 mg | DELAYED_RELEASE_TABLET | Freq: Every day | ORAL | Status: DC
  Administered 2018-05-03: 81 mg via ORAL
  Filled 2018-05-02: qty 1

## 2018-05-02 MED ORDER — METOPROLOL TARTRATE 5 MG/5ML IV SOLN
5.0000 mg | INTRAVENOUS | Status: DC | PRN
  Filled 2018-05-02: qty 5

## 2018-05-02 MED ORDER — SODIUM CHLORIDE 0.9% FLUSH
3.0000 mL | Freq: Two times a day (BID) | INTRAVENOUS | Status: DC
  Administered 2018-05-02 – 2018-05-04 (×3): 3 mL via INTRAVENOUS

## 2018-05-02 MED ORDER — SODIUM CHLORIDE 0.9% FLUSH: 3.0000 mL | INTRAVENOUS | Status: DC | PRN

## 2018-05-02 MED ORDER — ONDANSETRON HCL 4 MG/2ML IJ SOLN: 4.0000 mg | Freq: Four times a day (QID) | INTRAMUSCULAR | Status: DC | PRN

## 2018-05-02 MED ORDER — LIDOCAINE HCL 1 % IJ SOLN
3.0000 mL | INTRAMUSCULAR | Status: AC | PRN
  Administered 2018-05-02: 3 mL

## 2018-05-02 MED ORDER — ACETAMINOPHEN 325 MG PO TABS: 650.0000 mg | ORAL_TABLET | ORAL | Status: DC | PRN

## 2018-05-02 MED ORDER — METOPROLOL TARTRATE 25 MG PO TABS
25.0000 mg | ORAL_TABLET | Freq: Two times a day (BID) | ORAL | Status: DC
  Administered 2018-05-02 – 2018-05-04 (×4): 25 mg via ORAL
  Filled 2018-05-02 (×4): qty 1

## 2018-05-02 MED ORDER — TRAMADOL HCL 50 MG PO TABS: 50.0000 mg | ORAL_TABLET | Freq: Four times a day (QID) | ORAL | 0 refills | Status: DC | PRN

## 2018-05-02 MED ORDER — ASPIRIN 81 MG PO CHEW
162.0000 mg | CHEWABLE_TABLET | Freq: Once | ORAL | Status: AC
Start: 2018-05-02 — End: 2018-05-02
  Administered 2018-05-02: 162 mg via ORAL
  Filled 2018-05-02: qty 2

## 2018-05-02 NOTE — ED Triage Notes (Signed)
Per [facility], pt coming from home with complaints of chest pain and SOB since 430pm today. Pt was found to be in Afib with HR in 150's-180's. Pt has no cardiac hx. EMS gave 5mg  of metoprolol and the HR dropped to 120's and chest pain was resolved. Pt a&o x4.

## 2018-05-02 NOTE — Progress Notes (Signed)
   Procedure Note  Patient: Debra Wright             Date of Birth: [birth year]           MRN: [id number]             Visit Date: 05/02/2018  HPI: Mrs. Kissner returns today for right knee pain.  She states that she is having increasing pain in the right knee.  She fell some week and a half ago whenever she tripped over her own shoes sustained a bruise to her right hip small abrasion in the knee.  She is ambulating with a cane.  She states most of the weight came down on her hip and moderate knee.  She has known end-stage arthritis of the right knee.  She requests an injection in the knee today.  Physical exam: General well-developed well-nourished female no acute distress mood and affect appropriate. Right knee she has tenderness along medial lateral joint lines.  No instability valgus varus stressing.  She has full extension flexion to beyond 90 degrees.  Calf supple nontender.  Procedures: Visit Diagnoses: Primary osteoarthritis of right knee  Large Joint Inj: R knee on 05/02/2018 5:46 PM Indications: pain Details: 22 G 1.5 in needle, superolateral approach  Arthrogram: No  Medications: 3 mL lidocaine 1 %; 40 mg methylPREDNISolone acetate 40 MG/ML Aspirate: 30 mL yellow Outcome: tolerated well, no immediate complications Procedure, treatment alternatives, risks and benefits explained, specific risks discussed. Consent was given by the patient. Immediately prior to procedure a time out was called to verify the correct patient, procedure, equipment, support staff and site/side marked as required. Patient was prepped and draped in the usual sterile fashion.     Plan: She is given Voltaren gel to apply to the knee 4 g up to 4 times daily.  She is also given tramadol for pain.  She will follow-up with [place] on as-needed basis pain persist or becomes worse.  Questions were encouraged and answered

## 2018-05-02 NOTE — ED Provider Notes (Signed)
[facility] DEPART[facility] Provider Note   CSN: [id number] Arrival date & time: 05/02/18  1922     History   Chief Complaint Chief Complaint  Patient presents with  . Chest Pain  . Shortness of Breath    HPI Debra Wright is a 67 y.o. female. Patient is a 67 year old female with history of diabetes, hypertension, hyperlipidemia, obesity, and anxiety who presents to the emergency department today for evaluation of chest pain and shortness of breath.  Reports the symptoms began around 4:30 PM today.  States that she was feeling well prior to onset of symptoms and had gone to her orthopedic surgery appointment today and had steroid injection of her right knee.  Returned home and at 430pm she began feeling palpitations and having chest pressure in the center of her chest as well as shortness of breath.  States that she had diaphoresis but no nausea or vomiting.  When EMS arrived they noticed heart rate ranging from 150s-180s with atrial fibrillation on ECG.  Patient denies any known history of atrial fibrillation or atrial flutter in the past.  Takes no anticoagulants.  5 mg of metoprolol was given by EMS prior to arrival to this facility and heart rate improved to the 120s.  Patient reports feeling better at this time with no shortness of breath or diaphoresis.  Chest pain resolved.  No leg swelling.  No history of DVT or PE.   HPI   Past Medical History:  Diagnosis Date  . Anxiety   . Arthritis    "knees; right thumb" (10/14/2014)  . Basal cell carcinoma    "burned off upper lip & right shoulder"  . Depression   . Fibroid tumor   . Frequent diarrhea   . GERD (gastroesophageal reflux disease)   . High cholesterol   . Hypertension   . Insomnia   . Migraine    "frequently when I was younger; maybe monthly now" (10/14/2014)  . Pernicious anemia   . Type II diabetes mellitus ([place])   . Vitamin B 12 deficiency     Patient Active Problem List   Diagnosis Date  Noted  . Atrial fibrillation ([place]) 05/02/2018  . Atrial fibrillation and flutter ([place]) 05/02/2018  . Atrial fibrillation with RVR ([place]) 05/02/2018  . Unilateral primary osteoarthritis, right knee 06/14/2017  . OSA (obstructive sleep apnea) 02/13/2017  . Controlled type 2 diabetes mellitus without complication, without long-term current use of insulin ([place]) 10/31/2016  . Essential hypertension 08/02/2016  . Anxiety and depression 08/02/2016  . Insomnia 08/02/2016  . Arthritis of left knee 10/13/2014  . Status post total left knee replacement 10/13/2014    Past Surgical History:  Procedure Laterality Date  . ABDOMINAL HYSTERECTOMY  1988  . ACHILLES TENDON SURGERY Right   . APPENDECTOMY  1988  . BILATERAL OOPHORECTOMY  2004  . CARPAL TUNNEL RELEASE Bilateral 1986  . CATARACT EXTRACTION W/ INTRAOCULAR LENS  IMPLANT, BILATERAL Bilateral 2011  . JOINT REPLACEMENT    . SHOULDER ARTHROSCOPY DISTAL CLAVICLE EXCISION AND OPEN ROTATOR CUFF REPAIR Right 11/2013  . TOENAIL EXCISION Right 04/2018   "big toe"  . TONSILLECTOMY  1950's  . TOTAL KNEE ARTHROPLASTY Left 10/13/2014   Procedure: LEFT TOTAL KNEE ARTHROPLASTY;  Surgeon: Mcarthur Rossetti, MD;  Location: [facility];  Service: Orthopedics;  Laterality: Left;     OB History   None      Home Medications    Prior to Admission medications   Medication Sig Start Date  End Date Taking? Authorizing Provider  amLODipine (NORVASC) 10 MG tablet TAKE 1 TABLET BY MOUTH ONCE DAILY 03/21/18  Yes Nafziger, Tommi Rumps, NP  atorvastatin (LIPITOR) 40 MG tablet TAKE 1 TABLET BY MOUTH ONCE DAILY 02/19/18  Yes Nafziger, Tommi Rumps, NP  calcium carbonate (TUMS - DOSED IN MG ELEMENTAL CALCIUM) 500 MG chewable tablet Chew 2 tablets by mouth daily as needed for indigestion or heartburn.   Yes [provider]  Calcium Carbonate-Vitamin D (CALCIUM 600 + D PO) Take 1 tablet by mouth daily.   Yes [provider]  CINNAMON PO Take 1 tablet by mouth daily.    Yes [provider]  citalopram (CELEXA) 40 MG tablet Take 1 tablet (40 mg total) by mouth every morning. 05/15/17  Yes Nafziger, Tommi Rumps, NP  diclofenac (VOLTAREN) 75 MG EC tablet TAKE 1 TABLET BY MOUTH TWICE DAILY Patient taking differently: TAKE 1 TABLET BY MOUTH TWICE DAILY AS NEEDED FOR PAIN 03/20/18  Yes Mcarthur Rossetti, MD  diclofenac sodium (VOLTAREN) 1 % GEL Apply 4 g topically 4 (four) times daily. Patient taking differently: Apply 4 g topically 4 (four) times daily as needed (pain).  05/02/18  Yes Pete Pelt, PA-C  Eszopiclone 3 MG TABS TAKE ONE TABLET BY MOUTH AT BEDTIME  03/21/18  Yes Nafziger, Tommi Rumps, NP  glipiZIDE (GLUCOTROL XL) 5 MG 24 hr tablet Take 1 tablet (5 mg total) by mouth daily with breakfast. 02/05/18  Yes Nafziger, Tommi Rumps, NP  ibuprofen (ADVIL,MOTRIN) 200 MG tablet Take 400 mg by mouth every 6 (six) hours as needed for moderate pain.   Yes [provider]  magnesium oxide (MAG-OX) 400 (241.3 Mg) MG tablet Take 400 mg by mouth daily.   Yes [provider]  metFORMIN (GLUCOPHAGE) 1000 MG tablet TAKE 1 TABLET BY MOUTH TWICE DAILY WITH MEALS 04/16/18  Yes Nafziger, Tommi Rumps, NP  omega-3 acid ethyl esters (LOVAZA) 1 G capsule Take 2 g by mouth 2 (two) times daily.   Yes [provider]  omeprazole (PRILOSEC) 40 MG capsule TAKE 1 CAPSULE BY MOUTH ONCE DAILY IN THE MORNING 03/05/18  Yes Nafziger, Tommi Rumps, NP  terbinafine (LAMISIL) 250 MG tablet Take 250 mg by mouth daily.  01/30/18  Yes [provider]  traMADol (ULTRAM) 50 MG tablet Take 1 tablet (50 mg total) by mouth every 6 (six) hours as needed. Patient taking differently: Take 50 mg by mouth every 6 (six) hours as needed for moderate pain.  05/02/18  Yes Pete Pelt, PA-C    Family History Family History  Problem Relation Age of Onset  . Arthritis Mother   . Hyperlipidemia Mother   . Diabetes Mother   . Hypertension Mother   . Arthritis Father   . Hyperlipidemia Father   . Heart  disease Father   . Diabetes Father   . Stroke Brother   . Hypertension Brother   . Diabetes Brother   . Arthritis Maternal Grandmother   . Hyperlipidemia Maternal Grandmother   . Diabetes Maternal Grandmother   . Hypertension Maternal Grandmother   . Arthritis Maternal Grandfather   . Hyperlipidemia Maternal Grandfather   . Hypertension Maternal Grandfather   . Arthritis Paternal Grandmother   . Hyperlipidemia Paternal Grandmother   . Diabetes Paternal Grandmother   . Hypertension Paternal Grandmother   . Arthritis Paternal Grandfather   . Hyperlipidemia Paternal Grandfather   . Diabetes Paternal Grandfather   . Hypertension Paternal Grandfather     Social History Social History   Tobacco Use  .  Smoking status: Never Smoker  . Smokeless tobacco: Never Used  Substance Use Topics  . Alcohol use: Yes    Comment: 10/14/2014 "might have a drink when I'm out once/month"  . Drug use: No     Allergies   Antivert [meclizine hcl]   Review of Systems Review of Systems  Constitutional: Negative for chills and fever.  HENT: Negative for congestion and sore throat.   Eyes: Negative for visual disturbance.  Respiratory: Positive for shortness of breath. Negative for cough.   Cardiovascular: Positive for chest pain and palpitations. Negative for leg swelling.  Gastrointestinal: Negative for abdominal pain, diarrhea, nausea and vomiting.  Genitourinary: Negative for dysuria and hematuria.  Musculoskeletal: Negative for back pain and neck pain.  Skin: Negative for color change and rash.  Neurological: Negative for weakness and headaches.  All other systems reviewed and are negative.    Physical Exam Updated Vital Signs BP (!) 128/45 (BP Location: Right Arm)   Pulse 69   Temp 98.6 F (37 C) (Oral)   Resp 18   Ht 5\' 3"  (1.6 m)   Wt 99.8 kg (220 lb)   SpO2 98%   BMI 38.97 kg/m   Physical Exam  Constitutional: She appears well-developed and well-nourished. No distress.    HENT:  Head: Normocephalic and atraumatic.  Eyes: Conjunctivae are normal.  Neck: Neck supple.  Cardiovascular: Intact distal pulses. An irregularly irregular rhythm present. Tachycardia present.  Pulses:      Radial pulses are 2+ on the right side, and 2+ on the left side.  Pulmonary/Chest: Effort normal and breath sounds normal. No respiratory distress.  Abdominal: Soft. She exhibits no distension. There is no tenderness.  Musculoskeletal: She exhibits no edema.       Right lower leg: She exhibits no edema.       Left lower leg: She exhibits no edema.  Neurological: She is alert.  Skin: Skin is warm and dry.  Psychiatric: She has a normal mood and affect.  Nursing note and vitals reviewed.    ED Treatments / Results  Labs (all labs ordered are listed, but only abnormal results are displayed) Labs Reviewed  BASIC METABOLIC PANEL - Abnormal; Notable for the following components:      Result Value   Glucose, Bld 255 (*)    BUN 5 (*)    All other components within normal limits  CBC - Abnormal; Notable for the following components:   MCH 25.9 (*)    All other components within normal limits  CBG MONITORING, ED - Abnormal; Notable for the following components:   Glucose-Capillary 217 (*)    All other components within normal limits  MAGNESIUM  HIV ANTIBODY (ROUTINE TESTING)  TROPONIN I  TROPONIN I  TROPONIN I  TSH  T4, FREE  BRAIN NATRIURETIC PEPTIDE  BASIC METABOLIC PANEL  LIPID PANEL  CBC  I-STAT TROPONIN, ED  I-STAT TROPONIN, ED    EKG EKG Interpretation  Date/Time:  Thursday May 02 2018 19:30:12 EDT Ventricular Rate:  136 PR Interval:    QRS Duration: 88 QT Interval:  302 QTC Calculation: 455 R Axis:   -44 Text Interpretation:  rapid atrial fibrillation , new since last tracing LVH with secondary repolarization abnormality Anterior infarct, old Baseline wander in lead(s) V3 V5 V6 Reconfirmed by Dorie Rank [id number]) on 05/02/2018 7:55:09  PM   Radiology Dg Chest 2 View  Result Date: 05/02/2018 CLINICAL DATA:  Chest pain and shortness of breath for 1 day. EXAM: CHEST -  2 VIEW COMPARISON:  None. FINDINGS: Cardiomediastinal silhouette is normal. Mediastinal contours appear intact. There is no evidence of focal airspace consolidation, pleural effusion or pneumothorax. Osseous structures are without acute abnormality. Soft tissues are grossly normal. IMPRESSION: No active cardiopulmonary disease. Electronically Signed   By: Fidela Salisbury M.D.   On: 05/02/2018 20:28    Procedures Procedures (including critical care time)  Medications Ordered in ED Medications  metoprolol tartrate (LOPRESSOR) injection 5 mg (has no administration in time range)  acetaminophen (TYLENOL) tablet 650 mg (has no administration in time range)  ondansetron (ZOFRAN) injection 4 mg (has no administration in time range)  sodium chloride flush (NS) 0.9 % injection 3 mL (3 mLs Intravenous Given 05/02/18 2346)  sodium chloride flush (NS) 0.9 % injection 3 mL (has no administration in time range)  0.9 %  sodium chloride infusion (has no administration in time range)  metoprolol tartrate (LOPRESSOR) tablet 25 mg (25 mg Oral Given 05/02/18 2346)  aspirin EC tablet 81 mg (has no administration in time range)  aspirin chewable tablet 162 mg (162 mg Oral Given 05/02/18 2056)     Initial Impression / Assessment and Plan / ED Course  I have reviewed the triage vital signs and the nursing notes.  Pertinent labs & imaging results that were available during my care of the patient were reviewed by me and considered in my medical decision making (see chart for details).    Patient is a 67 year old female with history of diabetes, hypertension, hyperlipidemia, obesity, and anxiety who presents to the emergency department today for evaluation of chest pain and shortness of breath.  Pt arrived Afib RVR with rate 130s on ECG. Isolated TWI in AVL. No STE. When compared to  ECG from march 2017, TWI in AVL is only notable change.  There was no acute cardiac or pulmonary normality.  BMP remarkable only for glucose of 255.  No significant acidosis.  CBC unremarkable.  Troponin detectable but within normal limits x2.  Patient given IV metoprolol and converted back to sinus rhythm with rate of 80s.  Reports resolution of symptoms.  States she is feeling at her baseline with no further chest pain or shortness of breath.  No leg swelling or risk factors to suggest PE. Appears to have been sudden onset. Discussed case with cardiology who evaluated patient in the emergency department and will admit for further observation and work-up of new onset atrial fibrillation.  Patient and husband agreeable with this plan.  Stable time of transfer to patient.  Case and plan of care discussed with Dr. Tomi Bamberger.    Final Clinical Impressions(s) / ED Diagnoses   Final diagnoses:  None    ED Discharge Orders    None       Corrie Dandy, MD 05/03/18 0000    Dorie Rank, MD 05/03/18 [phone]

## 2018-05-02 NOTE — H&P (Signed)
Cardiology Admission History and Physical:   Patient ID: Debra Wright; MRN: [id number]; DOB: [birth year]   Admission date: 05/02/2018  Primary Care Provider: Dorothyann Peng, NP Primary Cardiologist: No primary care provider on file.    Chief Complaint:  Palpitations and chest tightness  History of Present Illness:   Debra Wright is a 67 y.o. female with a history of hypertension, type 2 diabetes mellitus, obstructive sleep apnea, hyperlipidemia and anxiety who presents with complaints of palpitations that started in the afternoon today. She states that for the past 6 months she has had occasional feeling of fluttering in her chest. These episodes were short-lived and would only last a few minutes. Today she had a steroid injection to her right knee in the clinic. On returning home she had acute onset of palpitations. She also experienced chest tightness and had some diaphoresis. She states that the pain was in the center of the chest and radiated to both arms. She also had some associated shortness of breath. She denies any nausea or vomiting. The EMS tracing recorded atrial fibrillation with rapid ventricular rate. For a heart rate between 150s and 180s she was given IV metoprolol (5 mg). In the ED she received further doses of metoprolol and eventually converted to normal sinus rhythm.  Her ECG revealed atrial fibrillation/atrial flutter, mild LVH and poor R-wave progression with Q waves in V1 and V2. At a rapid ventricular rate she also had ST depressions in leads 1 and aVL.  She is a retired Building services engineer. She leads a sedentary lifestyle. She is limited in her ability to ambulate because of her arthritis. She denies any orthopnea, paroxysmal nocturnal dyspnea or leg edema.    Past Medical History:  Diagnosis Date  . Anxiety   . Arthritis    "knees; right thumb" (10/14/2014)  . Basal cell carcinoma    "burned off upper lip & right shoulder"  . Depression   . Fibroid tumor   . Frequent diarrhea    . GERD (gastroesophageal reflux disease)   . High cholesterol   . Hypertension   . Insomnia   . Migraine    "frequently when I was younger; maybe monthly now" (10/14/2014)  . Pernicious anemia   . Type II diabetes mellitus ([place])   . Vitamin B 12 deficiency     Past Surgical History:  Procedure Laterality Date  . ABDOMINAL HYSTERECTOMY  1988  . ACHILLES TENDON SURGERY Right   . APPENDECTOMY  1988  . BILATERAL OOPHORECTOMY  2004  . CARPAL TUNNEL RELEASE Bilateral 1986  . CATARACT EXTRACTION W/ INTRAOCULAR LENS  IMPLANT, BILATERAL Bilateral 2011  . JOINT REPLACEMENT    . SHOULDER ARTHROSCOPY DISTAL CLAVICLE EXCISION AND OPEN ROTATOR CUFF REPAIR Right 11/2013  . TONSILLECTOMY  1950's  . TOTAL KNEE ARTHROPLASTY Left 10/13/2014   Procedure: LEFT TOTAL KNEE ARTHROPLASTY;  Surgeon: Mcarthur Rossetti, MD;  Location: [facility];  Service: Orthopedics;  Laterality: Left;     Medications Prior to Admission: Prior to Admission medications   Medication Sig Start Date End Date Taking? Authorizing Provider  amLODipine (NORVASC) 10 MG tablet TAKE 1 TABLET BY MOUTH ONCE DAILY 03/21/18   Nafziger, Tommi Rumps, NP  atorvastatin (LIPITOR) 40 MG tablet TAKE 1 TABLET BY MOUTH ONCE DAILY 02/19/18   Nafziger, Tommi Rumps, NP  calcium carbonate (TUMS - DOSED IN MG ELEMENTAL CALCIUM) 500 MG chewable tablet Chew 2 tablets by mouth daily as needed for indigestion or heartburn.    [provider]  Calcium Carbonate-Vitamin D (  CALCIUM 600 + D PO) Take 1 tablet by mouth daily.    [provider]  citalopram (CELEXA) 40 MG tablet Take 1 tablet (40 mg total) by mouth every morning. 05/15/17   Nafziger, Tommi Rumps, NP  diclofenac (VOLTAREN) 75 MG EC tablet TAKE 1 TABLET BY MOUTH TWICE DAILY 03/20/18   Mcarthur Rossetti, MD  diclofenac sodium (VOLTAREN) 1 % GEL Apply 4 g topically 4 (four) times daily. 05/02/18   Pete Pelt, PA-C  Eszopiclone 3 MG TABS TAKE ONE TABLET BY MOUTH AT BEDTIME  03/21/18   Nafziger,  Tommi Rumps, NP  glipiZIDE (GLUCOTROL XL) 5 MG 24 hr tablet Take 1 tablet (5 mg total) by mouth daily with breakfast. 02/05/18   Nafziger, Tommi Rumps, NP  ibuprofen (ADVIL,MOTRIN) 200 MG tablet Take 400 mg by mouth every 6 (six) hours as needed for moderate pain.    [provider]  metFORMIN (GLUCOPHAGE) 1000 MG tablet TAKE 1 TABLET BY MOUTH TWICE DAILY WITH MEALS 04/16/18   Nafziger, Tommi Rumps, NP  omega-3 acid ethyl esters (LOVAZA) 1 G capsule Take 2 g by mouth 2 (two) times daily.    [provider]  omeprazole (PRILOSEC) 40 MG capsule TAKE 1 CAPSULE BY MOUTH ONCE DAILY IN THE MORNING 03/05/18   Nafziger, Tommi Rumps, NP  terbinafine (LAMISIL) 250 MG tablet  01/30/18   [provider]  traMADol (ULTRAM) 50 MG tablet Take 1 tablet (50 mg total) by mouth every 6 (six) hours as needed. 05/02/18   Pete Pelt, PA-C     Allergies:    Allergies  Allergen Reactions  . Antivert [Meclizine Hcl] Other (See Comments)    "makes me feel like my skin is falling off".    Social History:   Social History   Socioeconomic History  . Marital status: Married    Spouse name: Not on file  . Number of children: Not on file  . Years of education: Not on file  . Highest education level: Not on file  Occupational History  . Not on file  Social Needs  . Financial resource strain: Not on file  . Food insecurity:    Worry: Not on file    Inability: Not on file  . Transportation needs:    Medical: Not on file    Non-medical: Not on file  Tobacco Use  . Smoking status: Never Smoker  . Smokeless tobacco: Never Used  Substance and Sexual Activity  . Alcohol use: Yes    Comment: 10/14/2014 "might have a drink when I'm out once/month"  . Drug use: No  . Sexual activity: Not Currently  Lifestyle  . Physical activity:    Days per week: Not on file    Minutes per session: Not on file  . Stress: Not on file  Relationships  . Social connections:    Talks on phone: Not on file    Gets together: Not on  file    Attends religious service: Not on file    Active member of club or organization: Not on file    Attends meetings of clubs or organizations: Not on file    Relationship status: Not on file  . Intimate partner violence:    Fear of current or ex partner: Not on file    Emotionally abused: Not on file    Physically abused: Not on file    Forced sexual activity: Not on file  Other Topics Concern  . 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, sports for 60   Married        Family History:   The patient's family history includes Arthritis in her father, maternal grandfather, maternal grandmother, mother, paternal grandfather, and paternal grandmother; Diabetes in her brother, father, maternal grandmother, mother, paternal grandfather, and paternal grandmother; Heart disease in her father; Hyperlipidemia in her father, maternal grandfather, maternal grandmother, mother, paternal grandfather, and paternal grandmother; Hypertension in her brother, maternal grandfather, maternal grandmother, mother, paternal grandfather, and paternal grandmother; Stroke in her brother.    Review of Systems: [y] = yes, [ ]  = no   . General: Weight gain [ ] ; Weight loss [ ] ; Anorexia [ ] ; Fatigue [ ] ; Fever [ ] ; Chills [ ] ; Weakness [ ]   . Cardiac: Chest pain/pressure Blue.Reese ]; Resting SOB Blue.Reese ]; Exertional SOB [ ] ; Orthopnea [ ] ; Pedal Edema [ ] ; Palpitations Blue.Reese ]; Syncope [ ] ; Presyncope [ ] ; Paroxysmal nocturnal dyspnea[ ]   . Pulmonary: Cough [ ] ; Wheezing[ ] ; Hemoptysis[ ] ; Sputum [ ] ; Snoring [ ]   . GI: Vomiting[ ] ; Dysphagia[ ] ; Melena[ ] ; Hematochezia [ ] ; Heartburn[ ] ; Abdominal pain [ ] ; Constipation [ ] ; Diarrhea [ ] ; BRBPR [ ]   . GU: Hematuria[ ] ; Dysuria [ ] ; Nocturia[ ]   . Vascular: Pain in legs with walking [ ] ; Pain in feet with lying flat [ ] ; Non-healing sores [ ] ; Stroke [ ] ; TIA [ ] ; Slurred speech [ ] ;  . Neuro: Headaches[ ] ; Vertigo[ ] ; Seizures[ ] ; Paresthesias[ ] ;Blurred  vision [ ] ; Diplopia [ ] ; Vision changes [ ]   . Ortho/Skin: Arthritis [ y; Joint pain Blue.Reese ]; Muscle pain [ ] ; Joint swelling [ ] ; Back Pain [ ] ; Rash [ ]   . Psych: Depression[ ] ; Anxiety[ ]   . Heme: Bleeding problems [ ] ; Clotting disorders [ ] ; Anemia [ ]   . Endocrine: Diabetes [ ] ; Thyroid dysfunction[ ]     Physical Exam/Data:   Vitals:   05/02/18 2030 05/02/18 2045 05/02/18 2046 05/02/18 2103  BP:   (!) 115/53 109/85  Pulse: 72 71 72 66  Resp:  (!) 22 (!) 23 17  Temp:      TempSrc:      SpO2: 100% 100% 100% 99%  Weight:      Height:       No intake or output data in the 24 hours ending 05/02/18 2156 Filed Weights   05/02/18 1927  Weight: 99.8 kg (220 lb)   Body mass index is 38.97 kg/m.  General:  Well nourished, well developed, in no acute distress HEENT: normal Lymph: no adenopathy Neck: no JVD Endocrine:  No thryomegaly Vascular: No carotid bruits; FA pulses 2+ bilaterally without bruits  Cardiac:  normal S1, S2; RRR; no murmur Lungs:  clear to auscultation bilaterally, no wheezing, rhonchi or rales  Abd: soft, nontender, no hepatomegaly  Ext: no edema Musculoskeletal:  No deformities, BUE and BLE strength normal and equal Skin: warm and dry  Neuro:  CNs 2-12 intact, no focal abnormalities noted Psych:  Normal affect    EKG: ECG revealed atrial fibrillation/atrial flutter, mild LVH and poor R-wave progression with Q waves in V1 and V2. At a rapid ventricular rate she also had ST depressions in leads 1 and aVL.  Relevant CV Studies: NONE  Laboratory Data:  Chemistry Recent Labs  Lab 05/02/18 1937  NA 141  K 3.6  CL 105  CO2 22  GLUCOSE 255*  BUN 5*  CREATININE 0.70  CALCIUM 9.1  GFRNONAA >60  GFRAA >60  ANIONGAP 14    No results for input(s): PROT, ALBUMIN, AST, ALT, ALKPHOS, BILITOT in the last 168 hours. Hematology Recent Labs  Lab 05/02/18 1937  WBC 8.7  RBC 4.86  HGB 12.6  HCT 40.5  MCV 83.3  MCH 25.9*  MCHC 31.1  RDW 15.2  PLT 303    Cardiac EnzymesNo results for input(s): TROPONINI in the last 168 hours.  Recent Labs  Lab 05/02/18 1951  TROPIPOC 0.01    BNPNo results for input(s): BNP, PROBNP in the last 168 hours.  DDimer No results for input(s): DDIMER in the last 168 hours.  Radiology/Studies:  Dg Chest 2 View  Result Date: 05/02/2018 CLINICAL DATA:  Chest pain and shortness of breath for 1 day. EXAM: CHEST - 2 VIEW COMPARISON:  None. FINDINGS: Cardiomediastinal silhouette is normal. Mediastinal contours appear intact. There is no evidence of focal airspace consolidation, pleural effusion or pneumothorax. Osseous structures are without acute abnormality. Soft tissues are grossly normal. IMPRESSION: No active cardiopulmonary disease. Electronically Signed   By: Fidela Salisbury M.D.   On: 05/02/2018 20:28    Assessment and Plan:   1. Paroxysmal atrial fibrillation The patient has reverted back to normal sinus rhythm after receiving multiple doses of IV metoprolol. Her heart rate currently is in the 60s and 70s.  - Use AV nodal blockers to control the heart rate. - Intravenous unfractionated heparin for anticoagulation - Obtain a transthoracic echocardiogram in the morning to rule out any structural heart disease - CHA2DS2-VASc score is at least 3. At the time of discharge she will need to be placed on either Xarelto or Eliquis.  2. Chest pain  - Aspirin 81 mg daily - Metoprolol 25 mg twice daily - Trend cardiac biomarkers and obtain serial ECGs - Given her chest pain and abnormal electrocardiogram, she will need an ischemic evaluation.     Severity of Illness: The appropriate patient status for this patient is OBSERVATION. Observation status is judged to be reasonable and necessary in order to provide the required intensity of service to ensure the patient's safety. The patient's presenting symptoms, physical exam findings, and initial radiographic and laboratory data in the context of their medical  condition is felt to place them at decreased risk for further clinical deterioration. Furthermore, it is anticipated that the patient will be medically stable for discharge from the hospital within 2 midnights of admission. The following factors support the patient status of observation.   " The patient's presenting symptoms include palpitations. " The physical exam findings include tachycardia. " The initial radiographic and laboratory data are atrial fibrillation/atrial flutter on EKG.     For questions or updates, please contact [place] Please consult [email or web address] for contact info under Cardiology/STEMI.    Signed, Meade Maw, MD  05/02/2018 9:56 PM

## 2018-05-02 NOTE — ED Notes (Signed)
ED Provider at bedside. 

## 2018-05-03 ENCOUNTER — Ambulatory Visit (HOSPITAL_COMMUNITY): Payer: Medicare Other

## 2018-05-03 ENCOUNTER — Observation Stay (HOSPITAL_BASED_OUTPATIENT_CLINIC_OR_DEPARTMENT_OTHER): Payer: Medicare Other

## 2018-05-03 DIAGNOSIS — K219 Gastro-esophageal reflux disease without esophagitis: Secondary | ICD-10-CM | POA: Diagnosis not present

## 2018-05-03 DIAGNOSIS — Z9989 Dependence on other enabling machines and devices: Secondary | ICD-10-CM | POA: Diagnosis not present

## 2018-05-03 DIAGNOSIS — E785 Hyperlipidemia, unspecified: Secondary | ICD-10-CM | POA: Diagnosis not present

## 2018-05-03 DIAGNOSIS — G4733 Obstructive sleep apnea (adult) (pediatric): Secondary | ICD-10-CM

## 2018-05-03 DIAGNOSIS — E119 Type 2 diabetes mellitus without complications: Secondary | ICD-10-CM | POA: Diagnosis not present

## 2018-05-03 DIAGNOSIS — R002 Palpitations: Secondary | ICD-10-CM | POA: Diagnosis not present

## 2018-05-03 DIAGNOSIS — D510 Vitamin B12 deficiency anemia due to intrinsic factor deficiency: Secondary | ICD-10-CM | POA: Diagnosis not present

## 2018-05-03 DIAGNOSIS — I4891 Unspecified atrial fibrillation: Secondary | ICD-10-CM | POA: Diagnosis not present

## 2018-05-03 DIAGNOSIS — G43909 Migraine, unspecified, not intractable, without status migrainosus: Secondary | ICD-10-CM | POA: Diagnosis not present

## 2018-05-03 DIAGNOSIS — I5030 Unspecified diastolic (congestive) heart failure: Secondary | ICD-10-CM | POA: Diagnosis not present

## 2018-05-03 DIAGNOSIS — G4700 Insomnia, unspecified: Secondary | ICD-10-CM | POA: Diagnosis not present

## 2018-05-03 DIAGNOSIS — I420 Dilated cardiomyopathy: Secondary | ICD-10-CM | POA: Diagnosis not present

## 2018-05-03 DIAGNOSIS — I4892 Unspecified atrial flutter: Secondary | ICD-10-CM | POA: Diagnosis not present

## 2018-05-03 DIAGNOSIS — I480 Paroxysmal atrial fibrillation: Secondary | ICD-10-CM | POA: Diagnosis not present

## 2018-05-03 DIAGNOSIS — I110 Hypertensive heart disease with heart failure: Secondary | ICD-10-CM | POA: Diagnosis not present

## 2018-05-03 LAB — CBC
HCT: 36.3 % (ref 36.0–46.0)
Hemoglobin: 11.3 g/dL — ABNORMAL LOW (ref 12.0–15.0)
MCH: 25.8 pg — ABNORMAL LOW (ref 26.0–34.0)
MCHC: 31.1 g/dL (ref 30.0–36.0)
MCV: 82.9 fL (ref 78.0–100.0)
PLATELETS: 308 10*3/uL (ref 150–400)
RBC: 4.38 MIL/uL (ref 3.87–5.11)
RDW: 15.2 % (ref 11.5–15.5)
WBC: 8.6 10*3/uL (ref 4.0–10.5)

## 2018-05-03 LAB — GLUCOSE, CAPILLARY
GLUCOSE-CAPILLARY: 188 mg/dL — AB (ref 70–99)
Glucose-Capillary: 114 mg/dL — ABNORMAL HIGH (ref 70–99)

## 2018-05-03 LAB — BRAIN NATRIURETIC PEPTIDE: B Natriuretic Peptide: 110.6 pg/mL — ABNORMAL HIGH (ref 0.0–100.0)

## 2018-05-03 LAB — LIPID PANEL
CHOL/HDL RATIO: 3.3 ratio
Cholesterol: 102 mg/dL (ref 0–200)
HDL: 31 mg/dL — AB (ref >40)
LDL CALC: 49 mg/dL (ref 0–99)
Triglycerides: 110 mg/dL (ref <150)
VLDL: 22 mg/dL (ref 0–40)

## 2018-05-03 LAB — HEPARIN LEVEL (UNFRACTIONATED)
HEPARIN UNFRACTIONATED: 0.14 [IU]/mL — AB (ref 0.30–0.70)
HEPARIN UNFRACTIONATED: 0.17 [IU]/mL — AB (ref 0.30–0.70)

## 2018-05-03 LAB — BASIC METABOLIC PANEL
Anion gap: 13 (ref 5–15)
BUN: 7 mg/dL — AB (ref 8–23)
CHLORIDE: 100 mmol/L (ref 98–111)
CO2: 26 mmol/L (ref 22–32)
Calcium: 8.7 mg/dL — ABNORMAL LOW (ref 8.9–10.3)
Creatinine, Ser: 0.67 mg/dL (ref 0.44–1.00)
GFR calc Af Amer: >60 mL/min (ref >60)
GLUCOSE: 248 mg/dL — AB (ref 70–99)
POTASSIUM: 3.8 mmol/L (ref 3.5–5.1)
Sodium: 139 mmol/L (ref 135–145)

## 2018-05-03 LAB — MAGNESIUM: Magnesium: 1.3 mg/dL — ABNORMAL LOW (ref 1.7–2.4)

## 2018-05-03 LAB — TROPONIN I

## 2018-05-03 LAB — ECHOCARDIOGRAM COMPLETE
HEIGHTINCHES: 62.5 in
Weight: 3529.6 oz

## 2018-05-03 LAB — TSH: TSH: 1.694 u[IU]/mL (ref 0.350–4.500)

## 2018-05-03 LAB — T4, FREE: Free T4: 1.12 ng/dL (ref 0.82–1.77)

## 2018-05-03 LAB — HIV ANTIBODY (ROUTINE TESTING W REFLEX): HIV SCREEN 4TH GENERATION: NONREACTIVE

## 2018-05-03 MED ORDER — PERFLUTREN LIPID MICROSPHERE
1.0000 mL | INTRAVENOUS | Status: AC | PRN
  Administered 2018-05-03: 2 mL via INTRAVENOUS
  Filled 2018-05-03: qty 10

## 2018-05-03 MED ORDER — HEPARIN BOLUS VIA INFUSION
2000.0000 [IU] | Freq: Once | INTRAVENOUS | Status: AC
  Administered 2018-05-03: 2000 [IU] via INTRAVENOUS
  Filled 2018-05-03: qty 2000

## 2018-05-03 MED ORDER — HEPARIN BOLUS VIA INFUSION
3500.0000 [IU] | Freq: Once | INTRAVENOUS | Status: AC
  Administered 2018-05-03: 3500 [IU] via INTRAVENOUS
  Filled 2018-05-03: qty 3500

## 2018-05-03 MED ORDER — MAGNESIUM SULFATE 2 GM/50ML IV SOLN
2.0000 g | Freq: Once | INTRAVENOUS | Status: AC
  Administered 2018-05-03: 2 g via INTRAVENOUS
  Filled 2018-05-03: qty 50

## 2018-05-03 MED ORDER — ZOLPIDEM TARTRATE 5 MG PO TABS
5.0000 mg | ORAL_TABLET | Freq: Every evening | ORAL | Status: DC | PRN
  Administered 2018-05-03 (×2): 5 mg via ORAL
  Filled 2018-05-03 (×2): qty 1

## 2018-05-03 MED ORDER — DILTIAZEM HCL ER COATED BEADS 120 MG PO CP24
120.0000 mg | ORAL_CAPSULE | Freq: Every day | ORAL | Status: DC
  Administered 2018-05-03 – 2018-05-04 (×2): 120 mg via ORAL
  Filled 2018-05-03 (×2): qty 1

## 2018-05-03 MED ORDER — HEPARIN (PORCINE) IN NACL 100-0.45 UNIT/ML-% IJ SOLN
1500.0000 [IU]/h | INTRAMUSCULAR | Status: DC
  Administered 2018-05-03: 1200 [IU]/h via INTRAVENOUS
  Administered 2018-05-03: 1000 [IU]/h via INTRAVENOUS
  Filled 2018-05-03 (×2): qty 250

## 2018-05-03 MED ORDER — ATORVASTATIN CALCIUM 40 MG PO TABS
40.0000 mg | ORAL_TABLET | Freq: Every day | ORAL | Status: DC
  Administered 2018-05-03: 40 mg via ORAL
  Filled 2018-05-03: qty 1

## 2018-05-03 MED ORDER — INSULIN ASPART 100 UNIT/ML ~~LOC~~ SOLN
0.0000 [IU] | Freq: Three times a day (TID) | SUBCUTANEOUS | Status: DC
  Administered 2018-05-03 – 2018-05-04 (×2): 3 [IU] via SUBCUTANEOUS

## 2018-05-03 NOTE — Progress Notes (Addendum)
Progress Note  Patient Name: Debra Wright Date of Encounter: 05/03/2018  Primary Cardiologist: New to Dr. Claiborne Billings  Subjective   No recurrent chest tightness or palpitation.  Denies shortness of breath.  Inpatient Medications    Scheduled Meds: . aspirin EC  81 mg Oral Daily  . insulin aspart  0-15 Units Subcutaneous TID WC  . metoprolol tartrate  25 mg Oral BID  . sodium chloride flush  3 mL Intravenous Q12H   Continuous Infusions: . sodium chloride    . heparin 1,000 Units/hr (05/03/18 0146)  . magnesium sulfate 1 - 4 g bolus IVPB     PRN Meds: sodium chloride, acetaminophen, metoprolol tartrate, ondansetron (ZOFRAN) IV, sodium chloride flush, zolpidem   Vital Signs    Vitals:   05/03/18 0452 05/03/18 0500 05/03/18 1010 05/03/18 1011  BP: (!) 121/59  110/61 110/61  Pulse: 66  (!) 58 (!) 59  Resp: 18     Temp: 98.5 F (36.9 C)     TempSrc: Oral     SpO2: 98%   98%  Weight:  220 lb 9.6 oz (100.1 kg)    Height:  5' 2.5" (1.588 m)      Intake/Output Summary (Last 24 hours) at 05/03/2018 1048 Last data filed at 05/03/2018 1000 Gross per 24 hour  Intake 219.84 ml  Output 250 ml  Net -30.16 ml   Filed Weights   05/02/18 1927 05/02/18 2311 05/03/18 0500  Weight: 220 lb (99.8 kg) 221 lb 3.2 oz (100.3 kg) 220 lb 9.6 oz (100.1 kg)    Telemetry    Sinus rhythm with PAC- Personally Reviewed  ECG    Not applicable  Physical Exam   GEN: No acute distress.   Neck: No JVD Cardiac: RRR, no murmurs, rubs, or gallops.  Respiratory: Clear to auscultation bilaterally. GI: Soft, nontender, non-distended  MS: No edema; No deformity. Neuro:  Nonfocal  Psych: Normal affect   Labs    Chemistry Recent Labs  Lab 05/02/18 1937 05/03/18 0153  NA 141 139  K 3.6 3.8  CL 105 100  CO2 22 26  GLUCOSE 255* 248*  BUN 5* 7*  CREATININE 0.70 0.67  CALCIUM 9.1 8.7*  GFRNONAA >60 >60  GFRAA >60 >60  ANIONGAP 14 13     Hematology Recent Labs  Lab 05/02/18 1937  05/03/18 0153  WBC 8.7 8.6  RBC 4.86 4.38  HGB 12.6 11.3*  HCT 40.5 36.3  MCV 83.3 82.9  MCH 25.9* 25.8*  MCHC 31.1 31.1  RDW 15.2 15.2  PLT 303 308    Cardiac Enzymes Recent Labs  Lab 05/03/18 0153  TROPONINI <0.03    Recent Labs  Lab 05/02/18 1951 05/02/18 2237  TROPIPOC 0.01 0.01     BNP Recent Labs  Lab 05/03/18 0153  BNP 110.6*    Lipid Panel     Component Value Date/Time   CHOL 102 05/03/2018 0153   TRIG 110 05/03/2018 0153   HDL 31 (L) 05/03/2018 0153   CHOLHDL 3.3 05/03/2018 0153   VLDL 22 05/03/2018 0153   LDLCALC 49 05/03/2018 0153   LDLDIRECT 42.0 01/31/2018 0943   Radiology    Dg Chest 2 View  Result Date: 05/02/2018 CLINICAL DATA:  Chest pain and shortness of breath for 1 day. EXAM: CHEST - 2 VIEW COMPARISON:  None. FINDINGS: Cardiomediastinal silhouette is normal. Mediastinal contours appear intact. There is no evidence of focal airspace consolidation, pleural effusion or pneumothorax. Osseous structures are without acute abnormality. Soft tissues  are grossly normal. IMPRESSION: No active cardiopulmonary disease. Electronically Signed   By: Fidela Salisbury M.D.   On: 05/02/2018 20:28    Cardiac Studies   Pending echocardiogram  Patient Profile     67 y.o. female with history of hypertension, diabetes, obstructive sleep apnea on CPAP, hyperlipidemia and anxiety presented for evaluation of few hours history of palpitations associated with chest tightness who found to have new onset atrial fibrillation.  Assessment & Plan    1.  New onset atrial fibrillation -Reverted to sinus rhythm in ER after multiple rounds of IV metoprolol.  Maintaining sinus rhythm with PACs. -Chadvascs score of 4.  Continue heparin for anticoagulation.  Change to NOAC once no planned procedure. - Continue BB. Normal TSH. Pending echo.  2.  Chest tightness -Likely related to elevated rate.  Troponin negative.  Nonspecific ST/T wave changes in lead I and aVL with  RVR. -Keep n.p.o. until evaluation by MD.  3.  Diabetes -Sliding scale insulin while here  4.  Hypertension -Blood pressure stable.  Started metoprolol 25 mg twice daily.  Home amlodipine on hold.  5.  Hypomagnesemia -Will supplement.  6. HLD - 05/03/2018: Cholesterol 102; HDL 31; LDL Cholesterol 49; Triglycerides 110; VLDL 22  - Resume home statin   For questions or updates, please contact [place] Please consult [email or web address] for contact info under Cardiology/STEMI.      Jarrett Soho, PA  05/03/2018, 10:48 AM    Patient seen and examined. Agree with assessment and plan. Feels well. No recurrent chest pain. Had mild chest fullness sensation when in AF RVR. Completely resolved with restoration of sinus rhythm Trop negative. Echo scheduled today. F/U ECG today now in sinus to assess previous rate related nonspecific ST changes. Replace home amlodipine to cardizem CD 120 and continue metoprolol 25 mg bid.  If echo and ECG stable plan outpatient ischemic evaluation. Continiue CPAP for OSA.   Troy Sine, MD, [facility] 05/03/2018 11:19 AM

## 2018-05-03 NOTE — Progress Notes (Signed)
RT set up CPAP and placed on patient. Patient tolerating well at this time. RT will monitor as needed.  

## 2018-05-03 NOTE — Progress Notes (Signed)
Pt asking about plan for today, npo since midnight, paged Dr Wynonia Lawman to advise

## 2018-05-03 NOTE — Progress Notes (Signed)
Pt has home CPAP.  Pt stated she does not need assistance.

## 2018-05-03 NOTE — Care Management Note (Signed)
Case Management Note  Patient Details  Name: Debra Wright MRN: [id number] Date of Birth: September 30, 1951  Subjective/Objective:   Atrial Fibrillation               Action/Plan: Patient lives at home with spouse; Primary Care Provider: Dorothyann Peng, NP; has private insurance with Medicare/ AARP with prescription drug coverage; pharmacy of choice is [facility] in [facility]; patient reports no problem getting her medication; no DME, she continues to drive; no needs identified at this time; CM will continue to follow for progression of care.   Expected Discharge Date:   possibly 05/03/2018               Expected Discharge Plan:   Home  Status of Service:   In progress  Sherrilyn Rist [phone] 05/03/2018, 10:58 AM

## 2018-05-03 NOTE — Progress Notes (Signed)
Inpatient Diabetes Program Recommendations  AACE/ADA: New Consensus Statement on Inpatient Glycemic Control (2015)  Target Ranges:  Prepandial:   less than 140 mg/dL      Peak postprandial:   less than 180 mg/dL (1-2 hours)      Critically ill patients:  140 - 180 mg/dL   Results for Debra Wright, [name] (MRN [id number]) as of 05/03/2018 09:38  Ref. Range 05/02/2018 19:37 05/03/2018 01:53  Glucose Latest Ref Range: 70 - 99 mg/dL 255 (H) 248 (H)    Admit with: CP/ A Fib  History: DM  Home DM Meds: Metformin 1000 mg BID       Glipizide 5 mg daily  Current Orders: None       MD- Patient with History of Diabetes.  Takes oral Medications at home.  Please consider placing orders for Novolog Moderate Correction Scale/ SSI (0-15 units) Q4 hours      --Will follow patient during hospitalization--  Wyn Quaker RN, MSN, CDE Diabetes Coordinator Inpatient Glycemic Control Team Team Pager: [phone] (8a-5p)

## 2018-05-03 NOTE — Progress Notes (Signed)
ANTICOAGULATION CONSULT NOTE - Initial Consult  Pharmacy Consult for Heparin  Indication: atrial fibrillation  Allergies  Allergen Reactions  . Antivert [Meclizine Hcl] Other (See Comments)    "makes me feel like my skin is falling off".    Patient Measurements: Height: 5\' 3"  (160 cm) Weight: 220 lb (99.8 kg) IBW/kg (Calculated) : 52.4  Vital Signs: Temp: 98.6 F (37 C) (08/01 2337) Temp Source: Oral (08/01 2337) BP: 128/45 (08/01 2337) Pulse Rate: 69 (08/01 2337)  Labs: Recent Labs    05/02/18 1937  HGB 12.6  HCT 40.5  PLT 303  CREATININE 0.70    Estimated Creatinine Clearance: 74.8 mL/min (by C-G formula based on SCr of 0.7 mg/dL).   Medical History: Past Medical History:  Diagnosis Date  . Anxiety   . Arthritis    "knees; right thumb" (10/14/2014)  . Basal cell carcinoma    "burned off upper lip & right shoulder"  . Depression   . Fibroid tumor   . Frequent diarrhea   . GERD (gastroesophageal reflux disease)   . High cholesterol   . Hypertension   . Insomnia   . Migraine    "frequently when I was younger; maybe monthly now" (10/14/2014)  . Pernicious anemia   . Type II diabetes mellitus ([place])   . Vitamin B 12 deficiency     Assessment: 67 y/o F with afib to start heparin. No anti-coagulation PTA. CBC good. Renal function good.   Goal of Therapy:  Heparin level 0.3-0.7 units/ml Monitor platelets by anticoagulation protocol: Yes   Plan:  Heparin 3500 units BOLUS Start heparin drip at 1000 units/hr 0900 HL Daily CBC/HL Monitor for bleeding   Narda Bonds 05/03/2018,12:09 AM

## 2018-05-03 NOTE — Progress Notes (Signed)
ANTICOAGULATION CONSULT NOTE - Follow Up Consult  Pharmacy Consult for Heparin Indication: atrial fibrillation  Allergies  Allergen Reactions  . Antivert [Meclizine Hcl] Other (See Comments)    "makes me feel like my skin is falling off".    Patient Measurements: Height: 5' 2.5" (158.8 cm) Weight: 220 lb 9.6 oz (100.1 kg) IBW/kg (Calculated) : 51.25 Heparin Dosing Weight: 68 kg  Vital Signs: Temp: 98 F (36.7 C) (08/02 1958) Temp Source: Oral (08/02 1958) BP: 127/62 (08/02 1958) Pulse Rate: 65 (08/02 1958)  Labs: Recent Labs    05/02/18 1937 05/03/18 0153 05/03/18 0950 05/03/18 1823  HGB 12.6 11.3*  --   --   HCT 40.5 36.3  --   --   PLT 303 308  --   --   HEPARINUNFRC  --   --  0.14* 0.17*  CREATININE 0.70 0.67  --   --   TROPONINI  --  <0.03 <0.03  --     Estimated Creatinine Clearance: 74.2 mL/min (by C-G formula based on SCr of 0.67 mg/dL).  Assessment: Debra Wright on IV heparin for new onset atrial fib. Heparin level this evening remains subtherapeutic on 1200 units/hr - no issues per RN.    Goal of Therapy:  Heparin level 0.3-0.7 units/ml Monitor platelets by anticoagulation protocol: Yes   Plan:  -Increase heparin to 1500 units/hr -Recheck 6hr heparin level  [name], PharmD, BCPS Clinical Pharmacist [phone] Please check AMION for all [place] numbers 05/03/2018

## 2018-05-03 NOTE — Progress Notes (Signed)
ANTICOAGULATION CONSULT NOTE - Follow Up Consult  Pharmacy Consult for Heparin Indication: atrial fibrillation  Allergies  Allergen Reactions  . Antivert [Meclizine Hcl] Other (See Comments)    "makes me feel like my skin is falling off".    Patient Measurements: Height: 5' 2.5" (158.8 cm) Weight: 220 lb 9.6 oz (100.1 kg) IBW/kg (Calculated) : 51.25 Heparin Dosing Weight: 68 kg  Vital Signs: Temp: 98.5 F (36.9 C) (08/02 0452) Temp Source: Oral (08/02 0452) BP: 110/61 (08/02 1011) Pulse Rate: 59 (08/02 1011)  Labs: Recent Labs    05/02/18 1937 05/03/18 0153 05/03/18 0950  HGB 12.6 11.3*  --   HCT 40.5 36.3  --   PLT 303 308  --   HEPARINUNFRC  --   --  0.14*  CREATININE 0.70 0.67  --   TROPONINI  --  <0.03 <0.03    Estimated Creatinine Clearance: 74.2 mL/min (by C-G formula based on SCr of 0.67 mg/dL).  Assessment:   67 yr old female on IV heparin for atrial fibrillation.      Initial heparin level is subtherapeutic (0.14) on 1000 units/hr.   No infusion problems reported.    Goal of Therapy:  Heparin level 0.3-0.7 units/ml Monitor platelets by anticoagulation protocol: Yes   Plan:   Heparin 2000 units IV x 1  Increase heparin drip to 1200 units/hr.  Heparin level ~6 hrs after rate increase.  Daily heparin level and CBC while on heparin.  Arty Baumgartner, [place] Pager: [phone] or phone [phone] 05/03/2018,12:22 PM

## 2018-05-04 DIAGNOSIS — R0789 Other chest pain: Secondary | ICD-10-CM | POA: Diagnosis not present

## 2018-05-04 DIAGNOSIS — E119 Type 2 diabetes mellitus without complications: Secondary | ICD-10-CM

## 2018-05-04 DIAGNOSIS — R9431 Abnormal electrocardiogram [ECG] [EKG]: Secondary | ICD-10-CM | POA: Diagnosis not present

## 2018-05-04 DIAGNOSIS — E785 Hyperlipidemia, unspecified: Secondary | ICD-10-CM

## 2018-05-04 DIAGNOSIS — I4891 Unspecified atrial fibrillation: Secondary | ICD-10-CM | POA: Diagnosis not present

## 2018-05-04 DIAGNOSIS — I10 Essential (primary) hypertension: Secondary | ICD-10-CM

## 2018-05-04 DIAGNOSIS — I480 Paroxysmal atrial fibrillation: Secondary | ICD-10-CM | POA: Diagnosis not present

## 2018-05-04 LAB — CBC
HCT: 36.7 % (ref 36.0–46.0)
Hemoglobin: 11.2 g/dL — ABNORMAL LOW (ref 12.0–15.0)
MCH: 25.5 pg — AB (ref 26.0–34.0)
MCHC: 30.5 g/dL (ref 30.0–36.0)
MCV: 83.6 fL (ref 78.0–100.0)
PLATELETS: 283 10*3/uL (ref 150–400)
RBC: 4.39 MIL/uL (ref 3.87–5.11)
RDW: 15 % (ref 11.5–15.5)
WBC: 9.6 10*3/uL (ref 4.0–10.5)

## 2018-05-04 LAB — GLUCOSE, CAPILLARY
GLUCOSE-CAPILLARY: 137 mg/dL — AB (ref 70–99)
Glucose-Capillary: 153 mg/dL — ABNORMAL HIGH (ref 70–99)

## 2018-05-04 LAB — HEPARIN LEVEL (UNFRACTIONATED): Heparin Unfractionated: 0.41 IU/mL (ref 0.30–0.70)

## 2018-05-04 MED ORDER — DILTIAZEM HCL ER COATED BEADS 120 MG PO CP24: 120.0000 mg | ORAL_CAPSULE | Freq: Every day | ORAL | 5 refills | Status: DC

## 2018-05-04 MED ORDER — METOPROLOL TARTRATE 25 MG PO TABS: 25.0000 mg | ORAL_TABLET | Freq: Two times a day (BID) | ORAL | 5 refills | Status: DC

## 2018-05-04 MED ORDER — APIXABAN 5 MG PO TABS: 5.0000 mg | ORAL_TABLET | Freq: Two times a day (BID) | ORAL | 5 refills | Status: DC

## 2018-05-04 MED ORDER — APIXABAN 5 MG PO TABS: 5.0000 mg | ORAL_TABLET | Freq: Two times a day (BID) | ORAL | 0 refills | Status: DC

## 2018-05-04 MED ORDER — APIXABAN 5 MG PO TABS: 5.0000 mg | ORAL_TABLET | Freq: Two times a day (BID) | ORAL | Status: DC

## 2018-05-04 NOTE — Progress Notes (Signed)
[place] for Heparin  Indication: atrial fibrillation  Allergies  Allergen Reactions  . Antivert [Meclizine Hcl] Other (See Comments)    "makes me feel like my skin is falling off".    Patient Measurements: Height: 5' 2.5" (158.8 cm) Weight: 220 lb 9.6 oz (100.1 kg) IBW/kg (Calculated) : 51.25  Vital Signs: Temp: 97.8 F (36.6 C) (08/03 0018) Temp Source: Oral (08/03 0018) BP: 116/59 (08/03 0018) Pulse Rate: 56 (08/03 0018)  Labs: Recent Labs    05/02/18 1937 05/03/18 0153 05/03/18 0950 05/03/18 1823 05/04/18 0247  HGB 12.6 11.3*  --   --  11.2*  HCT 40.5 36.3  --   --  36.7  PLT 303 308  --   --  283  HEPARINUNFRC  --   --  0.14* 0.17* 0.41  CREATININE 0.70 0.67  --   --   --   TROPONINI  --  <0.03 <0.03  --   --     Estimated Creatinine Clearance: 74.2 mL/min (by C-G formula based on SCr of 0.67 mg/dL).   Medical History: Past Medical History:  Diagnosis Date  . Anxiety   . Arthritis    "knees; right thumb" (10/14/2014)  . Basal cell carcinoma    "burned off upper lip & right shoulder"  . Depression   . Fibroid tumor   . Frequent diarrhea   . GERD (gastroesophageal reflux disease)   . High cholesterol   . Hypertension   . Insomnia   . Migraine    "frequently when I was younger; maybe monthly now" (10/14/2014)  . Pernicious anemia   . Type II diabetes mellitus ([place])   . Vitamin B 12 deficiency     Assessment: 67 y/o F with afib to start heparin. No anti-coagulation PTA. CBC good. Renal function good.   8/3 AM update: heparin level therapeutic x 1 after rate increase  Goal of Therapy:  Heparin level 0.3-0.7 units/ml Monitor platelets by anticoagulation protocol: Yes   Plan:  Cont heparin drip at 1500 units/hr 1200 HL  Arshan Jabs 05/04/2018,3:48 AM

## 2018-05-04 NOTE — Discharge Summary (Signed)
Discharge Summary    Patient ID: Debra Wright,  MRN: [id number], DOB/AGE: 69-Dec-1952 69 y.o.  Admit date: 05/02/2018 Discharge date: 05/04/2018  Primary Care Provider: Dorothyann Peng Primary Cardiologist: New to Dr. Claiborne Billings  Discharge Diagnoses    Principal Problem:   Atrial fibrillation with RVR [facility]) Active Problems:   Essential hypertension   OSA (obstructive sleep apnea)   Atrial fibrillation and flutter ([place])   History of Present Illness     Debra Wright is a 67 y.o. female with past medical history of HTN, HLD, Type 2 DM, and OSA who presented to [facility] ED on 05/02/2018 for evaluation of new-onset palpitations which started that afternoon. Reported associated chest discomfort and diaphoresis. Upon EMS arrival, she was noted to be in atrial fibrillation with RVR, HR in the 150's to 180's.   While in the ED, she received multiple doses of IV Lopressor and converted back to NSR. Was started on Heparin and admitted for further observation.   Hospital Course     Consultants: None  The following morning, she denied any recurrent chest discomfort or palpitations. Cyclic tropinon values remained negative. TSH and electrolytes were within normal limits. An echocardiogram was performed and showed a preserved EF of 65-70%, no regional WMA, Grade 2 DD, and no significant valve abnormalities. Amlodipine was switched to Cardizem CD 120mg  daily and she was continued on Lopressor 25mg  BID.   On 05/04/2018, she was examined by Dr. Bronson Ing and denied any recurrent symptoms. Was maintaining NSR and rates were well-controlled. IV Heparin was discontinued and she was transitioned to Eliquis 5mg  BID. No further inpatient ischemic evaluation was pursued and a staff message has been sent to arrange for a Lexiscan Myoview as an outpatient.   She was discharged home in good condition. A staff message has been sent to the office to arrange for hospital follow-up within the next 2  weeks.  _____________  Discharge Vitals Blood pressure 111/73, pulse (!) 57, temperature 98.3 F (36.8 C), temperature source Oral, resp. rate 20, height 5' 2.5" (1.588 m), weight 221 lb 1.6 oz (100.3 kg), SpO2 97 %.  Filed Weights   05/02/18 2311 05/03/18 0500 05/04/18 0613  Weight: 221 lb 3.2 oz (100.3 kg) 220 lb 9.6 oz (100.1 kg) 221 lb 1.6 oz (100.3 kg)    Labs & Radiologic Studies     CBC Recent Labs    05/03/18 0153 05/04/18 0247  WBC 8.6 9.6  HGB 11.3* 11.2*  HCT 36.3 36.7  MCV 82.9 83.6  PLT 308 426   Basic Metabolic Panel Recent Labs    05/02/18 1937 05/03/18 0153  NA 141 139  K 3.6 3.8  CL 105 100  CO2 22 26  GLUCOSE 255* 248*  BUN 5* 7*  CREATININE 0.70 0.67  CALCIUM 9.1 8.7*  MG  --  1.3*   Liver Function Tests No results for input(s): AST, ALT, ALKPHOS, BILITOT, PROT, ALBUMIN in the last 72 hours. No results for input(s): LIPASE, AMYLASE in the last 72 hours. Cardiac Enzymes Recent Labs    05/03/18 0153 05/03/18 0950  TROPONINI <0.03 <0.03   BNP Invalid input(s): POCBNP D-Dimer No results for input(s): DDIMER in the last 72 hours. Hemoglobin A1C No results for input(s): HGBA1C in the last 72 hours. Fasting Lipid Panel Recent Labs    05/03/18 0153  CHOL 102  HDL 31*  LDLCALC 49  TRIG 110  CHOLHDL 3.3   Thyroid Function Tests Recent Labs    05/03/18  0153  TSH 1.694    Dg Chest 2 View  Result Date: 05/02/2018 CLINICAL DATA:  Chest pain and shortness of breath for 1 day. EXAM: CHEST - 2 VIEW COMPARISON:  None. FINDINGS: Cardiomediastinal silhouette is normal. Mediastinal contours appear intact. There is no evidence of focal airspace consolidation, pleural effusion or pneumothorax. Osseous structures are without acute abnormality. Soft tissues are grossly normal. IMPRESSION: No active cardiopulmonary disease. Electronically Signed   By: Fidela Salisbury M.D.   On: 05/02/2018 20:28     Diagnostic Studies/Procedures    Echocardiogram: 05/03/2018 Study Conclusions  - Left ventricle: The cavity size was normal. There was moderate   concentric hypertrophy. Systolic function was vigorous. The   estimated ejection fraction was in the range of 65% to 70%. Wall   motion was normal; there were no regional wall motion   abnormalities. Features are consistent with a pseudonormal left   ventricular filling pattern, with concomitant abnormal relaxation   and increased filling pressure (grade 2 diastolic dysfunction).   Doppler parameters are consistent with high ventricular filling   pressure. - Aortic valve: Trileaflet; mildly thickened, mildly calcified   leaflets. - Aorta: Aortic root dimension: 37 mm (ED). - Mitral valve: Calcified annulus. - Left atrium: The atrium was mildly to moderately dilated. - Pulmonary arteries: Systolic pressure could not be accurately   estimated.  Disposition   Pt is being discharged home today in good condition.  Follow-up Plans & Appointments    Follow-up Information    Troy Sine, MD Follow up.   Specialty:  Cardiology Why:  A staff message has been sent to the office to arrange for Cardiology follow-up. If you do not hear from them in 2-3 business days, please call the number provided.  Contact information: [address] [place] [address] [phone]          Discharge Instructions    Diet - low sodium heart healthy   Complete by:  As directed       Discharge Medications     Medication List    STOP taking these medications   amLODipine 10 MG tablet Commonly known as:  NORVASC   diclofenac 75 MG EC tablet Commonly known as:  VOLTAREN   ibuprofen 200 MG tablet Commonly known as:  ADVIL,MOTRIN     TAKE these medications   apixaban 5 MG Tabs tablet Commonly known as:  ELIQUIS Take 1 tablet (5 mg total) by mouth 2 (two) times daily.   atorvastatin 40 MG tablet Commonly known as:  LIPITOR TAKE 1 TABLET BY MOUTH  ONCE DAILY   CALCIUM 600 + D PO Take 1 tablet by mouth daily.   calcium carbonate 500 MG chewable tablet Commonly known as:  TUMS - dosed in mg elemental calcium Chew 2 tablets by mouth daily as needed for indigestion or heartburn.   CINNAMON PO Take 1 tablet by mouth daily.   citalopram 40 MG tablet Commonly known as:  CELEXA Take 1 tablet (40 mg total) by mouth every morning.   diclofenac sodium 1 % Gel Commonly known as:  VOLTAREN Apply 4 g topically 4 (four) times daily. What changed:    when to take this  reasons to take this   diltiazem 120 MG 24 hr capsule Commonly known as:  CARDIZEM CD Take 1 capsule (120 mg total) by mouth daily. Start taking on:  05/05/2018   Eszopiclone 3 MG Tabs TAKE ONE TABLET BY MOUTH AT BEDTIME   glipiZIDE 5  MG 24 hr tablet Commonly known as:  GLUCOTROL XL Take 1 tablet (5 mg total) by mouth daily with breakfast.   magnesium oxide 400 (241.3 Mg) MG tablet Commonly known as:  MAG-OX Take 400 mg by mouth daily.   metFORMIN 1000 MG tablet Commonly known as:  GLUCOPHAGE TAKE 1 TABLET BY MOUTH TWICE DAILY WITH MEALS   metoprolol tartrate 25 MG tablet Commonly known as:  LOPRESSOR Take 1 tablet (25 mg total) by mouth 2 (two) times daily.   omega-3 acid ethyl esters 1 g capsule Commonly known as:  LOVAZA Take 2 g by mouth 2 (two) times daily.   omeprazole 40 MG capsule Commonly known as:  PRILOSEC TAKE 1 CAPSULE BY MOUTH ONCE DAILY IN THE MORNING   terbinafine 250 MG tablet Commonly known as:  LAMISIL Take 250 mg by mouth daily.   traMADol 50 MG tablet Commonly known as:  ULTRAM Take 1 tablet (50 mg total) by mouth every 6 (six) hours as needed. What changed:  reasons to take this         Allergies Allergies  Allergen Reactions  . Antivert [Meclizine Hcl] Other (See Comments)    "makes me feel like my skin is falling off".    Acute coronary syndrome (MI, NSTEMI, STEMI, etc) this admission?: No.    Outstanding  Labs/Studies   None  Duration of Discharge Encounter   Greater than 30 minutes including physician time.  Signed, Erma Heritage, PA-C 05/04/2018, 12:10 PM

## 2018-05-04 NOTE — Progress Notes (Signed)
Progress Note  Patient Name: Debra Wright Date of Encounter: 05/04/2018  Primary Cardiologist: New to Dr. Claiborne Billings    Subjective   Doing well this morning.  She denies chest pain, palpitations, and shortness of breath.  Inpatient Medications    Scheduled Meds: . aspirin EC  81 mg Oral Daily  . atorvastatin  40 mg Oral q1800  . diltiazem  120 mg Oral Daily  . insulin aspart  0-15 Units Subcutaneous TID WC  . metoprolol tartrate  25 mg Oral BID  . sodium chloride flush  3 mL Intravenous Q12H   Continuous Infusions: . sodium chloride    . heparin 1,500 Units/hr (05/04/18 0324)   PRN Meds: sodium chloride, acetaminophen, metoprolol tartrate, ondansetron (ZOFRAN) IV, sodium chloride flush, zolpidem   Vital Signs    Vitals:   05/03/18 1958 05/03/18 2222 05/04/18 0018 05/04/18 0613  BP: 127/62 (!) 160/58 (!) 116/59 129/63  Pulse: 65 63 (!) 56 (!) 58  Resp: 20  20 20   Temp: 98 F (36.7 C)  97.8 F (36.6 C) 98.6 F (37 C)  TempSrc: Oral  Oral Oral  SpO2: 97%  99% 97%  Weight:    221 lb 1.6 oz (100.3 kg)  Height:        Intake/Output Summary (Last 24 hours) at 05/04/2018 0958 Last data filed at 05/04/2018 0940 Gross per 24 hour  Intake 1595.35 ml  Output 1225 ml  Net 370.35 ml   Filed Weights   05/02/18 2311 05/03/18 0500 05/04/18 0613  Weight: 221 lb 3.2 oz (100.3 kg) 220 lb 9.6 oz (100.1 kg) 221 lb 1.6 oz (100.3 kg)    Telemetry    Sinus rhythm and sinus bradycardia- Personally Reviewed  ECG    No new tracings- Personally Reviewed  Physical Exam   GEN: No acute distress.   Neck: No JVD Cardiac: RRR, no murmurs, rubs, or gallops.  Respiratory: Clear to auscultation bilaterally. GI: Soft, nontender, non-distended  MS: No edema; No deformity. Neuro:  Nonfocal  Psych: Normal affect   Labs    Chemistry Recent Labs  Lab 05/02/18 1937 05/03/18 0153  NA 141 139  K 3.6 3.8  CL 105 100  CO2 22 26  GLUCOSE 255* 248*  BUN 5* 7*  CREATININE 0.70 0.67    CALCIUM 9.1 8.7*  GFRNONAA >60 >60  GFRAA >60 >60  ANIONGAP 14 13     Hematology Recent Labs  Lab 05/02/18 1937 05/03/18 0153 05/04/18 0247  WBC 8.7 8.6 9.6  RBC 4.86 4.38 4.39  HGB 12.6 11.3* 11.2*  HCT 40.5 36.3 36.7  MCV 83.3 82.9 83.6  MCH 25.9* 25.8* 25.5*  MCHC 31.1 31.1 30.5  RDW 15.2 15.2 15.0  PLT 303 308 283    Cardiac Enzymes Recent Labs  Lab 05/03/18 0153 05/03/18 0950  TROPONINI <0.03 <0.03    Recent Labs  Lab 05/02/18 1951 05/02/18 2237  TROPIPOC 0.01 0.01     BNP Recent Labs  Lab 05/03/18 0153  BNP 110.6*     DDimer No results for input(s): DDIMER in the last 168 hours.   Radiology    Dg Chest 2 View  Result Date: 05/02/2018 CLINICAL DATA:  Chest pain and shortness of breath for 1 day. EXAM: CHEST - 2 VIEW COMPARISON:  None. FINDINGS: Cardiomediastinal silhouette is normal. Mediastinal contours appear intact. There is no evidence of focal airspace consolidation, pleural effusion or pneumothorax. Osseous structures are without acute abnormality. Soft tissues are grossly normal. IMPRESSION: No active  cardiopulmonary disease. Electronically Signed   By: Fidela Salisbury M.D.   On: 05/02/2018 20:28    Cardiac Studies   Echocardiogram 05/03/2018:  Study Conclusions  - Left ventricle: The cavity size was normal. There was moderate   concentric hypertrophy. Systolic function was vigorous. The   estimated ejection fraction was in the range of 65% to 70%. Wall   motion was normal; there were no regional wall motion   abnormalities. Features are consistent with a pseudonormal left   ventricular filling pattern, with concomitant abnormal relaxation   and increased filling pressure (grade 2 diastolic dysfunction).   Doppler parameters are consistent with high ventricular filling   pressure. - Aortic valve: Trileaflet; mildly thickened, mildly calcified   leaflets. - Aorta: Aortic root dimension: 37 mm (ED). - Mitral valve: Calcified  annulus. - Left atrium: The atrium was mildly to moderately dilated. - Pulmonary arteries: Systolic pressure could not be accurately   estimated.  Patient Profile     67 y.o. female with history of hypertension, diabetes, obstructive sleep apnea on CPAP, hyperlipidemia and anxiety presented for evaluation of few hours history of palpitations associated with chest tightness who found to have new onset atrial fibrillation.    Assessment & Plan    1.  New onset atrial fibrillation: Has been maintaining sinus rhythm on long-acting diltiazem and metoprolol tartrate.  She is currently on IV heparin.  I will discontinue this and start apixaban 5 mg twice daily.  Left atrium is mild to moderately dilated with normal left ventricular systolic function.  2.  Chest tightness: No recurrence.  Troponins have been negative.  There were nonspecific ST T wave abnormalities in leads I and aVL while in rapid atrial fibrillation.  We will plan for an outpatient Lexiscan Myoview stress test.  I will stop aspirin and heparin.  3.  Type 2 diabetes mellitus: She takes glipizide and metformin at home.  4.  Hypertension: Blood pressure is normal.  His metoprolol has been started and she is on long-acting diltiazem I would discontinue amlodipine altogether.  5.  Hyperlipidemia: LDL 49.  Lipid panel reviewed.  Continue atorvastatin 40 mg.  Disposition: We will discharge to home today.  For questions or updates, please contact [place] Please consult [email or web address] for contact info under Cardiology/STEMI.      Signed, Kate Sable, MD  05/04/2018, 9:58 AM

## 2018-05-06 ENCOUNTER — Other Ambulatory Visit: Payer: Self-pay | Admitting: *Deleted

## 2018-05-06 DIAGNOSIS — I4891 Unspecified atrial fibrillation: Secondary | ICD-10-CM

## 2018-05-06 DIAGNOSIS — R079 Chest pain, unspecified: Secondary | ICD-10-CM

## 2018-05-06 DIAGNOSIS — I4892 Unspecified atrial flutter: Principal | ICD-10-CM

## 2018-05-08 ENCOUNTER — Ambulatory Visit: Payer: Medicare Other | Admitting: Adult Health

## 2018-05-14 ENCOUNTER — Telehealth (HOSPITAL_COMMUNITY): Payer: Self-pay

## 2018-05-14 NOTE — Telephone Encounter (Signed)
Encounter complete. 

## 2018-05-15 ENCOUNTER — Telehealth (HOSPITAL_COMMUNITY): Payer: Self-pay

## 2018-05-15 NOTE — Telephone Encounter (Signed)
Pt given detailed instructions for NUC MPI tomorrow. Pt did RBV same.

## 2018-05-16 ENCOUNTER — Inpatient Hospital Stay (HOSPITAL_COMMUNITY): Admission: RE | Admit: 2018-05-16 | Payer: Medicare Other | Source: Ambulatory Visit

## 2018-05-16 ENCOUNTER — Ambulatory Visit (HOSPITAL_COMMUNITY)
Admission: RE | Admit: 2018-05-16 | Discharge: 2018-05-16 | Disposition: A | Discharge status: Home / Self Care | Payer: Medicare Other | Source: Ambulatory Visit | Attending: Cardiology | Admitting: Cardiology

## 2018-05-16 DIAGNOSIS — R079 Chest pain, unspecified: Secondary | ICD-10-CM | POA: Insufficient documentation

## 2018-05-16 DIAGNOSIS — I4891 Unspecified atrial fibrillation: Secondary | ICD-10-CM

## 2018-05-16 LAB — MYOCARDIAL PERFUSION IMAGING
LVDIAVOL: 32 mL (ref 46–106)
LVSYSVOL: 95 mL
Peak HR: 61 {beats}/min
Rest HR: 48 {beats}/min
SDS: 2
SRS: 1
SSS: 3
TID: 1.34

## 2018-05-16 MED ORDER — TECHNETIUM TC 99M TETROFOSMIN IV KIT
31.3000 | PACK | Freq: Once | INTRAVENOUS | Status: AC | PRN
  Administered 2018-05-16: 31.3 via INTRAVENOUS
  Filled 2018-05-16: qty 32

## 2018-05-16 MED ORDER — REGADENOSON 0.4 MG/5ML IV SOLN
0.4000 mg | Freq: Once | INTRAVENOUS | Status: AC
  Administered 2018-05-16: 0.4 mg via INTRAVENOUS

## 2018-05-16 MED ORDER — TECHNETIUM TC 99M TETROFOSMIN IV KIT
10.5000 | PACK | Freq: Once | INTRAVENOUS | Status: AC | PRN
Start: 2018-05-16 — End: 2018-05-16
  Administered 2018-05-16: 10.5 via INTRAVENOUS
  Filled 2018-05-16: qty 11

## 2018-05-17 ENCOUNTER — Other Ambulatory Visit: Payer: Self-pay | Admitting: Adult Health

## 2018-05-17 ENCOUNTER — Telehealth: Payer: Self-pay

## 2018-05-17 NOTE — Telephone Encounter (Signed)
   [facility] Pre-operative Risk Assessment    Request for surgical clearance:  1. What type of surgery is being performed? Injection    2. When is this surgery scheduled? 05/30/2018   3. What type of clearance is required (medical clearance vs. Pharmacy clearance to hold med vs. Both)? Medical and Pharmacy  4. Are there any medications that need to be held prior to surgery and how long? Eliquis   5. Practice name and name of physician performing surgery? [facility] Dr. Jovita Gamma  6. What is your office phone number [phone] ext [phone]    7.   What is your office fax number [phone] Attention [place]  8.   Anesthesia type (None, local, MAC, general) ? None   Jacqulynn Cadet 05/17/2018, 2:40 PM  _________________________________________________________________   (provider comments below)

## 2018-05-20 NOTE — Telephone Encounter (Signed)
Contacted pt re: surgical clearance for the injection scheduled for 05/30/18. Pt has been made aware that we can't interrupt the Eliquis until she is seen at her f/u appt 06/04/18, and that Dr. Donnella Bi office may contact her to push the injection back Pt stated that she understood and thanked me for the call.  Will fax the clearance over to Dr. Donnella Bi office for notification of pts appt 06/04/18.

## 2018-05-20 NOTE — Telephone Encounter (Signed)
   Primary Cardiologist:Thomas Claiborne Billings, MD  Chart reviewed as part of pre-operative protocol coverage.  She was recently admitted to the hospital 8/3 with atrial fibrillation and spontaneously converted to NSR. Therefore, before Eliquis can be interrupted, patient needs to be seen in the office 9/3 as scheduled before this can be cleared (will see Kerin Ransom on that day).  Pre-op covering staff: - Please call patient to inform them. - Please contact requesting surgeon's office via preferred method (i.e, phone, fax) to inform them of need for appointment prior to interruption of blood thinner  Charlie Pitter, PA-C  05/20/2018, 3:54 PM

## 2018-05-22 ENCOUNTER — Ambulatory Visit (INDEPENDENT_AMBULATORY_CARE_PROVIDER_SITE_OTHER): Payer: Medicare Other | Admitting: Pulmonary Disease

## 2018-05-22 ENCOUNTER — Encounter: Payer: Self-pay | Admitting: Pulmonary Disease

## 2018-05-22 ENCOUNTER — Ambulatory Visit: Payer: Medicare Other | Admitting: Adult Health

## 2018-05-22 DIAGNOSIS — G4733 Obstructive sleep apnea (adult) (pediatric): Secondary | ICD-10-CM | POA: Diagnosis not present

## 2018-05-22 NOTE — Assessment & Plan Note (Signed)
Good compliance with the machine which is really helped her daytime somnolence and fatigue, we also discussed benefits for atrial fibrillation. For her facial rash, suggest  Trial of nasal pillows If this does not work, then trial of cloth nasal mask   Weight loss encouraged, compliance with goal of at least 4-6 hrs every night is the expectation. Advised against medications with sedative side effects Cautioned against driving when sleepy - understanding that sleepiness will vary on a day to day basis

## 2018-05-22 NOTE — Progress Notes (Signed)
   Subjective:    Patient ID: Debra Wright, female    DOB: 05-13-1951, 67 y.o.   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 She has sleep onset insomnia for which she has taken multiple medications over the past few years including Ambien, Benadryl, Tylenol PM and melatonin. She has been maintained on Lunesta 3 mg since 2014  She has been using nasal mask, her face is breaking out into a rash.  She saw dermatology and was given a cream.  But still has bumps and then some scaling  She is otherwise compliant with her machine and this is really helped improve her daytime somnolence and fatigue.  She had new onset atrial fibrillation and required hospital admission, reverted to sinus rhythm, is now maintained on metoprolol Cardizem and apixaban and has follow-up with cardiology.  CPAP download was reviewed which shows good compliance about 6 hours every night average pressure about 15 cm 19 auto settings 10 to 18 cm with mild leak   Significant tests/ events reviewed  HST6/2018severe OSA with 35 events per hour  Review of Systems neg for any significant sore throat, dysphagia, itching, sneezing, nasal congestion or excess/ purulent secretions, fever, chills, sweats, unintended wt loss, pleuritic or exertional cp, hempoptysis, orthopnea pnd or change in chronic leg swelling. Also denies presyncope, palpitations, heartburn, abdominal pain, nausea, vomiting, diarrhea or change in bowel or urinary habits, dysuria,hematuria, rash, arthralgias, visual complaints, headache, numbness weakness or ataxia.     Objective:   Physical Exam   Gen. Pleasant, obese, in no distress ENT - no lesions, no post nasal drip Neck: No JVD, no thyromegaly, no carotid bruits Lungs: no use of accessory muscles, no dullness to percussion, decreased without rales or rhonchi  Cardiovascular: Rhythm regular, heart sounds  normal, no murmurs or gallops, no peripheral edema Musculoskeletal: No  deformities, no cyanosis or clubbing , no tremors        Assessment & Plan:

## 2018-05-22 NOTE — Patient Instructions (Signed)
Trial of nasal pillows If this does not work, then trial of cloth nasal mask

## 2018-05-22 NOTE — Addendum Note (Signed)
Addended by: Valerie Salts on: 05/22/2018 12:02 PM   Modules accepted: Orders

## 2018-05-26 ENCOUNTER — Other Ambulatory Visit: Payer: Self-pay | Admitting: Adult Health

## 2018-05-26 DIAGNOSIS — F419 Anxiety disorder, unspecified: Principal | ICD-10-CM

## 2018-05-26 DIAGNOSIS — Z760 Encounter for issue of repeat prescription: Secondary | ICD-10-CM

## 2018-05-26 DIAGNOSIS — F329 Major depressive disorder, single episode, unspecified: Secondary | ICD-10-CM

## 2018-05-28 NOTE — Telephone Encounter (Signed)
SENT TO THE PHARMACY BY E-SCRIBE. 

## 2018-05-31 ENCOUNTER — Other Ambulatory Visit: Payer: Self-pay | Admitting: Student

## 2018-05-31 DIAGNOSIS — L218 Other seborrheic dermatitis: Secondary | ICD-10-CM | POA: Diagnosis not present

## 2018-06-04 ENCOUNTER — Encounter: Payer: Self-pay | Admitting: Cardiology

## 2018-06-04 ENCOUNTER — Ambulatory Visit (INDEPENDENT_AMBULATORY_CARE_PROVIDER_SITE_OTHER): Payer: Medicare Other | Admitting: Cardiology

## 2018-06-04 VITALS — BP 140/60 | HR 53 | Ht 63.0 in | Wt 223.6 lb

## 2018-06-04 DIAGNOSIS — I480 Paroxysmal atrial fibrillation: Secondary | ICD-10-CM | POA: Diagnosis not present

## 2018-06-04 DIAGNOSIS — Z6841 Body Mass Index (BMI) 40.0 and over, adult: Secondary | ICD-10-CM

## 2018-06-04 DIAGNOSIS — I119 Hypertensive heart disease without heart failure: Secondary | ICD-10-CM | POA: Insufficient documentation

## 2018-06-04 DIAGNOSIS — G4733 Obstructive sleep apnea (adult) (pediatric): Secondary | ICD-10-CM | POA: Diagnosis not present

## 2018-06-04 DIAGNOSIS — I10 Essential (primary) hypertension: Secondary | ICD-10-CM | POA: Diagnosis not present

## 2018-06-04 DIAGNOSIS — E119 Type 2 diabetes mellitus without complications: Secondary | ICD-10-CM | POA: Diagnosis not present

## 2018-06-04 DIAGNOSIS — Z7901 Long term (current) use of anticoagulants: Secondary | ICD-10-CM

## 2018-06-04 DIAGNOSIS — R5383 Other fatigue: Secondary | ICD-10-CM | POA: Insufficient documentation

## 2018-06-04 MED ORDER — METOPROLOL TARTRATE 25 MG PO TABS: 12.5000 mg | ORAL_TABLET | Freq: Two times a day (BID) | ORAL | 3 refills | Status: DC

## 2018-06-04 NOTE — Assessment & Plan Note (Signed)
On C-pap 

## 2018-06-04 NOTE — Assessment & Plan Note (Signed)
Normal LVF with grade 2 DD and mild to moderate LAE

## 2018-06-04 NOTE — Assessment & Plan Note (Signed)
CHADS VASC=3 

## 2018-06-04 NOTE — Assessment & Plan Note (Signed)
converted with rate control 05/02/18

## 2018-06-04 NOTE — Assessment & Plan Note (Signed)
Possible secondary to bradycardia.

## 2018-06-04 NOTE — Progress Notes (Signed)
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  The patient has a a history of hypertension, type 2 diabetes mellitus, obstructive sleep apnea, hyperlipidemia and anxiety. She notes that for the past 6 months she has had occasional feeling of fluttering in her chest. These episodes were short-lived and would only last a few minutes. On 05/02/18 she had a steroid injection to her right knee. On returning home she had acute onset of palpitations. This was associated with chest pain and diaphoresis. The EMS tracing recorded atrial fibrillation with rapid ventricular rate. In the ED her heart rate was betweeAEn 150s and 180s and she was given IV metoprolol (5 mg) and eventually converted to normal sinus rhythm. Lopressor PO was added and her Amlodipine was changed to Diltiazem. Echo in the hospital showed her EF to be normal with garde 2DD and mild to moderate LAE. OP Myoview 05/16/18 was negative for ischemia. She is in the office today for follow up. She denies any further tachycardia but has noted fatigue. Her HR is 50-NSR.  She also wants to know if its OK to hold her Eliquis for a spinal injection (Dr Ellene Route).    Current Outpatient Medications  Medication Sig Dispense Refill  . atorvastatin (LIPITOR) 40 MG tablet TAKE 1 TABLET BY MOUTH ONCE DAILY 90 tablet 3  . calcium carbonate (TUMS - DOSED IN MG ELEMENTAL CALCIUM) 500 MG chewable tablet Chew 2 tablets by mouth daily as needed for indigestion or heartburn.    . Calcium Carbonate-Vitamin D (CALCIUM 600 + D PO) Take 1 tablet by mouth daily.    Marland Kitchen CINNAMON PO Take 1 tablet by mouth daily.    . citalopram (CELEXA) 40 MG tablet TAKE 1 TABLET BY MOUTH ONCE DAILY IN THE MORNING 90 tablet 2  . diclofenac sodium (VOLTAREN) 1 % GEL Apply 4 g topically 4 (four) times daily. (Patient taking  differently: Apply 4 g topically 4 (four) times daily as needed (pain). ) 1 Tube 3  . diltiazem (CARDIZEM CD) 120 MG 24 hr capsule Take 1 capsule (120 mg total) by mouth daily. 60 capsule 5  . ELIQUIS 5 MG TABS tablet TAKE 1 TABLET BY MOUTH TWICE A DAY 60 tablet 0  . Eszopiclone 3 MG TABS TAKE ONE TABLET BY MOUTH AT BEDTIME  30 tablet 2  . GLIPIZIDE XL 5 MG 24 hr tablet TAKE 1 TABLET BY MOUTH ONCE DAILY WITH BREAKFAST 90 tablet 0  . magnesium oxide (MAG-OX) 400 (241.3 Mg) MG tablet Take 400 mg by mouth daily.    . metFORMIN (GLUCOPHAGE) 1000 MG tablet TAKE 1 TABLET BY MOUTH TWICE DAILY WITH MEALS 180 tablet 0  . metoprolol tartrate (LOPRESSOR) 25 MG tablet Take 1 tablet (25 mg total) by mouth 2 (two) times daily. 60 tablet 5  . omega-3 acid ethyl esters (LOVAZA) 1 G capsule Take 2 g by mouth 2 (two) times daily.    Marland Kitchen omeprazole (PRILOSEC) 40 MG capsule TAKE 1 CAPSULE BY MOUTH ONCE DAILY IN THE MORNING 90 capsule 3  . terbinafine (LAMISIL) 250 MG tablet Take 250 mg by mouth daily.     . traMADol (ULTRAM) 50 MG tablet Take 1 tablet (50 mg total) by mouth every 6 (six) hours as needed. (Patient taking differently: Take 50 mg by mouth every 6 (six) hours as needed for  moderate pain. ) 40 tablet 0   No current facility-administered medications for this visit.     Allergies  Allergen Reactions  . Antivert [Meclizine Hcl] Other (See Comments)    "makes me feel like my skin is falling off".    Past Medical History:  Diagnosis Date  . Anxiety   . Arthritis    "knees; right thumb" (10/14/2014)  . Basal cell carcinoma    "burned off upper lip & right shoulder"  . Chronic anticoagulation    CHADS VASC=3  . Depression   . Fibroid tumor   . Frequent diarrhea   . GERD (gastroesophageal reflux disease)   . High cholesterol   . Hypertension    EF 65-70% grade 2DD  . Insomnia   . Migraine    "frequently when I was younger; maybe monthly now" (10/14/2014)  . PAF (paroxysmal atrial fibrillation)  ([place]) 05/02/2018   converted with rate control  . Pernicious anemia   . Sleep apnea   . Type II diabetes mellitus ([place])   . Vitamin B 12 deficiency     Social History   Socioeconomic History  . Marital status: Married    Spouse name: Not on file  . Number of children: Not on file  . Years of education: Not on file  . Highest education level: Not on file  Occupational History  . Not on file  Social Needs  . Financial resource strain: Not on file  . Food insecurity:    Worry: Not on file    Inability: Not on file  . Transportation needs:    Medical: Not on file    Non-medical: Not on file  Tobacco Use  . Smoking status: Never Smoker  . Smokeless tobacco: Never Used  Substance and Sexual Activity  . Alcohol use: Yes    Comment: 10/14/2014 "might have a drink when I'm out once/month"  . Drug use: No  . Sexual activity: Not Currently  Lifestyle  . Physical activity:    Days per week: Not on file    Minutes per session: Not on file  . Stress: Not on file  Relationships  . Social connections:    Talks on phone: Not on file    Gets together: Not on file    Attends religious service: Not on file    Active member of club or organization: Not on file    Attends meetings of clubs or organizations: Not on file    Relationship status: Not on file  . Intimate partner violence:    Fear of current or ex partner: Not on file    Emotionally abused: Not on file    Physically abused: Not on file    Forced sexual activity: Not on file  Other Topics Concern  . 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 Arthritis Mother   . Hyperlipidemia Mother   . Diabetes Mother   . Hypertension Mother   . Arthritis Father   . Hyperlipidemia Father   . Heart disease Father   . Diabetes Father   . Stroke Brother   . Hypertension Brother   . Diabetes Brother   . Arthritis Maternal  Grandmother   . Hyperlipidemia Maternal Grandmother   . Diabetes Maternal Grandmother   . Hypertension Maternal Grandmother   . Arthritis  Maternal Grandfather   . Hyperlipidemia Maternal Grandfather   . Hypertension Maternal Grandfather   . Arthritis Paternal Grandmother   . Hyperlipidemia Paternal Grandmother   . Diabetes Paternal Grandmother   . Hypertension Paternal Grandmother   . Arthritis Paternal Grandfather   . Hyperlipidemia Paternal Grandfather   . Diabetes Paternal Grandfather   . Hypertension Paternal Grandfather      Review of Systems: General: negative for chills, fever, night sweats or weight changes.  Cardiovascular: negative for chest pain, dyspnea on exertion, edema, orthopnea, palpitations, paroxysmal nocturnal dyspnea or shortness of breath Dermatological: negative for rash Respiratory: negative for cough or wheezing Urologic: negative for hematuria Abdominal: negative for nausea, vomiting, diarrhea, bright red blood per rectum, melena, or hematemesis Neurologic: negative for visual changes, syncope, or dizziness All other systems reviewed and are otherwise negative except as noted above.    Blood pressure 140/60, pulse (!) 53, height 5\' 3"  (1.6 m), weight 223 lb 9.6 oz (101.4 kg), SpO2 98 %.  General appearance: alert, cooperative, no distress and moderately obese Lungs: clear to auscultation bilaterally Heart: regular rate and rhythm Extremities: no edema Skin: cool and dry Neurologic: Grossly normal  EKG NSR, SB  ASSESSMENT AND PLAN:   PAF (paroxysmal atrial fibrillation) (HCC) converted with rate control 05/02/18  Chronic anticoagulation CHADS VASC=3  Hypertensive cardiovascular disease Normal LVF with grade 2 DD and mild to moderate LAE  OSA (obstructive sleep apnea) On C-pap  NIDDM-type 2 On Glucophage  Morbid obesity with BMI of 40.0-44.9, adult ([employer]) .   PLAN  Decrease Lopressor to 12.5 mg BID- we may have to stop this altogether  if her HR doesn't come up a little. Discussed holding her Eliquis with Dr Ellyn Hack- OK to hold 3 days pre op, resume when able post op. No further cardiac work up needed pre op.  I have sent Nikki at [facility] office a clearance note via [employer].  F/U Dr Claiborne Billings in 6 weeks.  Kerin Ransom PA-C 06/04/2018 1:51 PM

## 2018-06-04 NOTE — Patient Instructions (Addendum)
Medication Instructions:  Decrease Metoprolol to 12.5 twice a day.  If you need a refill on your cardiac medications before your next appointment, please call your pharmacy.  Labwork: None Ordered.  Testing/Procedures: None Ordered.  Special Instructions: Okay to hold Eliquis 3 days prior to procedure.  Follow-Up: Your physician wants you to follow-up in: 6 weeks with Dr.Kelly.    Thank you for choosing [facility] at [facility]!!

## 2018-06-04 NOTE — Assessment & Plan Note (Signed)
On Glucophage 

## 2018-06-05 ENCOUNTER — Encounter (INDEPENDENT_AMBULATORY_CARE_PROVIDER_SITE_OTHER): Payer: Self-pay | Admitting: Family Medicine

## 2018-06-05 ENCOUNTER — Ambulatory Visit (INDEPENDENT_AMBULATORY_CARE_PROVIDER_SITE_OTHER): Payer: Medicare Other | Admitting: Family Medicine

## 2018-06-05 VITALS — BP 143/77 | HR 57

## 2018-06-05 DIAGNOSIS — Z760 Encounter for issue of repeat prescription: Secondary | ICD-10-CM

## 2018-06-05 DIAGNOSIS — G4700 Insomnia, unspecified: Secondary | ICD-10-CM | POA: Diagnosis not present

## 2018-06-05 DIAGNOSIS — I10 Essential (primary) hypertension: Secondary | ICD-10-CM | POA: Diagnosis not present

## 2018-06-05 DIAGNOSIS — I4891 Unspecified atrial fibrillation: Secondary | ICD-10-CM | POA: Diagnosis not present

## 2018-06-05 DIAGNOSIS — E119 Type 2 diabetes mellitus without complications: Secondary | ICD-10-CM

## 2018-06-05 DIAGNOSIS — G4733 Obstructive sleep apnea (adult) (pediatric): Secondary | ICD-10-CM

## 2018-06-05 DIAGNOSIS — I119 Hypertensive heart disease without heart failure: Secondary | ICD-10-CM | POA: Diagnosis not present

## 2018-06-05 DIAGNOSIS — Z6841 Body Mass Index (BMI) 40.0 and over, adult: Secondary | ICD-10-CM | POA: Diagnosis not present

## 2018-06-05 DIAGNOSIS — I4892 Unspecified atrial flutter: Secondary | ICD-10-CM

## 2018-06-05 MED ORDER — ESZOPICLONE 3 MG PO TABS: 3.0000 mg | ORAL_TABLET | Freq: Every day | ORAL | 3 refills | Status: DC

## 2018-06-05 MED ORDER — DULOXETINE HCL 30 MG PO CPEP: 30.0000 mg | ORAL_CAPSULE | Freq: Every day | ORAL | 1 refills | Status: DC

## 2018-06-05 NOTE — Progress Notes (Signed)
Office Visit Note   Patient: Debra Wright           Date of Birth: 04-17-1951           MRN: [id number] Visit Date: 06/05/2018 Requested by: Dorothyann Peng, NP [place], [place] [address] PCP: Dorothyann Peng, NP  Subjective: Chief Complaint  Patient presents with  . reestablish primary care/ diabetes  . change med for depression    HPI: She is here to reestablish care.  From a medical standpoint, she has diabetes which has been suboptimally controlled.  Her last A1c was 8.3 in May.  Since then her blood sugars have been better.  Annual eye exams have been normal, no retinopathy.  No history of neuropathy or nephropathy.  Hypertension has been well controlled.  She had an episode of atrial fibrillation last month but converted to sinus rhythm and has not had any trouble since then.  She is still on anticoagulant per cardiology.  She tolerates statin for hyperlipidemia.  She has chronic pain in multiple areas as well as a history of anxiety.  She takes Celexa right now but would like to switch to Cymbalta.  She took it in the past with good results.  Migraine headaches have been well controlled.  Sleep apnea is managed with CPAP with good results.                ROS: All other systems were negative.  Objective: Vital Signs: BP (!) 143/77 (BP Location: Right Arm, Patient Position: Sitting, Cuff Size: Normal)   Pulse (!) 57   Physical Exam:  HEENT:  [place]/AT, PERRLA, EOM Full, no nystagmus.  Funduscopic examination within normal limits.  No conjunctival erythema.  Tympanic membranes are pearly gray with normal landmarks.  External ear canals are normal.  Nasal passages are clear.  Oropharynx is clear.  No significant lymphadenopathy.  No thyromegaly or nodules.  2+ carotid pulses without bruits. CV: Regular rate and rhythm without murmurs, rubs, or gallops.  No peripheral edema.  2+ radial and posterior tibial pulses. Lungs: Clear to auscultation throughout with  no wheezing or areas of consolidation. FEET: No hypertrophic calluses, normal sensation.   Imaging: None today  Assessment & Plan: 1.  Diabetes, has been suboptimally controlled -Nutritional information given today.  Follow-up in 6 months for labs including A1c.  2.  Hypertension, fairly well controlled  3.  Recent episode of atrial fibrillation, seems to be in sinus rhythm  4.  Anxiety and chronic pain -We will switch directly from Celexa to Cymbalta.  5.  Osteoarthritis  6.  Sleep apnea, controlled with CPAP  7.  Chronic insomnia -Refilled Lunesta  8.  GERD -Managing with proton pump inhibitor   Follow-Up Instructions: Return in about 6 months (around 12/04/2018).    Greater than 50 minutes spent discussing history and symptoms, as well as treatment plan.    Procedures: None   PMFS History: Patient Active Problem List   Diagnosis Date Noted  . Hypertensive cardiovascular disease 06/04/2018  . Morbid obesity with BMI of 40.0-44.9, adult ([place]) 06/04/2018  . Fatigue 06/04/2018  . Chronic anticoagulation   . Atrial fibrillation and flutter ([place]) 05/02/2018  . Atrial fibrillation with RVR ([place]) 05/02/2018  . PAF (paroxysmal atrial fibrillation) ([place]) 05/02/2018  . Unilateral primary osteoarthritis, right knee 06/14/2017  . OSA (obstructive sleep apnea) 02/13/2017  . NIDDM-type 2 10/31/2016  . Essential hypertension 08/02/2016  . Anxiety and depression 08/02/2016  . Insomnia 08/02/2016  . Arthritis  of left knee 10/13/2014  . Status post total left knee replacement 10/13/2014   Past Medical History:  Diagnosis Date  . Anxiety   . Arthritis    "knees; right thumb" (10/14/2014)  . Basal cell carcinoma    "burned off upper lip & right shoulder"  . Chronic anticoagulation    CHADS VASC=3  . Depression   . Fibroid tumor   . Frequent diarrhea   . GERD (gastroesophageal reflux disease)   . High cholesterol   . Hypertension    EF 65-70% grade 2DD  . Insomnia    . Migraine    "frequently when I was younger; maybe monthly now" (10/14/2014)  . PAF (paroxysmal atrial fibrillation) ([place]) 05/02/2018   converted with rate control  . Pernicious anemia   . Sleep apnea   . Type II diabetes mellitus ([place])   . Vitamin B 12 deficiency     Family History  Problem Relation Age of Onset  . Arthritis Mother   . Hyperlipidemia Mother   . Diabetes Mother   . Hypertension Mother   . Arthritis Father   . Hyperlipidemia Father   . Heart disease Father   . Diabetes Father   . Stroke Brother   . Hypertension Brother   . Diabetes Brother   . Arthritis Maternal Grandmother   . Hyperlipidemia Maternal Grandmother   . Diabetes Maternal Grandmother   . Hypertension Maternal Grandmother   . Arthritis Maternal Grandfather   . Hyperlipidemia Maternal Grandfather   . Hypertension Maternal Grandfather   . Arthritis Paternal Grandmother   . Hyperlipidemia Paternal Grandmother   . Diabetes Paternal Grandmother   . Hypertension Paternal Grandmother   . Arthritis Paternal Grandfather   . Hyperlipidemia Paternal Grandfather   . Diabetes Paternal Grandfather   . Hypertension Paternal Grandfather     Past Surgical History:  Procedure Laterality Date  . ABDOMINAL HYSTERECTOMY  1988  . ACHILLES TENDON SURGERY Right   . APPENDECTOMY  1988  . BILATERAL OOPHORECTOMY  2004  . CARPAL TUNNEL RELEASE Bilateral 1986  . CATARACT EXTRACTION W/ INTRAOCULAR LENS  IMPLANT, BILATERAL Bilateral 2011  . JOINT REPLACEMENT    . SHOULDER ARTHROSCOPY DISTAL CLAVICLE EXCISION AND OPEN ROTATOR CUFF REPAIR Right 11/2013  . TOENAIL EXCISION Right 04/2018   "big toe"  . TONSILLECTOMY  1950's  . TOTAL KNEE ARTHROPLASTY Left 10/13/2014   Procedure: LEFT TOTAL KNEE ARTHROPLASTY;  Surgeon: Mcarthur Rossetti, MD;  Location: [facility];  Service: Orthopedics;  Laterality: Left;   Social History   Occupational History  . Not on file  Tobacco Use  . Smoking status: Never Smoker  .  Smokeless tobacco: Never Used  Substance and Sexual Activity  . Alcohol use: Yes    Comment: 10/14/2014 "might have a drink when I'm out once/month"  . Drug use: No  . Sexual activity: Not Currently

## 2018-06-06 NOTE — Progress Notes (Signed)
Looks like she changed primary care? Can we make sure so that we can get the names of the providers switched over?

## 2018-06-07 ENCOUNTER — Encounter: Payer: Self-pay | Admitting: Adult Health

## 2018-06-07 ENCOUNTER — Encounter (INDEPENDENT_AMBULATORY_CARE_PROVIDER_SITE_OTHER): Payer: Self-pay | Admitting: Family Medicine

## 2018-06-07 MED ORDER — CLONAZEPAM 0.5 MG PO TABS: 0.2500 mg | ORAL_TABLET | Freq: Every evening | ORAL | 0 refills | Status: DC | PRN

## 2018-06-11 ENCOUNTER — Ambulatory Visit (INDEPENDENT_AMBULATORY_CARE_PROVIDER_SITE_OTHER): Payer: Medicare Other | Admitting: Family Medicine

## 2018-06-13 DIAGNOSIS — M48061 Spinal stenosis, lumbar region without neurogenic claudication: Secondary | ICD-10-CM | POA: Diagnosis not present

## 2018-06-13 DIAGNOSIS — M4726 Other spondylosis with radiculopathy, lumbar region: Secondary | ICD-10-CM | POA: Diagnosis not present

## 2018-06-13 DIAGNOSIS — M5136 Other intervertebral disc degeneration, lumbar region: Secondary | ICD-10-CM | POA: Diagnosis not present

## 2018-06-17 ENCOUNTER — Encounter (INDEPENDENT_AMBULATORY_CARE_PROVIDER_SITE_OTHER): Payer: Self-pay | Admitting: Family Medicine

## 2018-06-18 ENCOUNTER — Other Ambulatory Visit (INDEPENDENT_AMBULATORY_CARE_PROVIDER_SITE_OTHER): Payer: Self-pay | Admitting: Family Medicine

## 2018-06-18 MED ORDER — DULOXETINE HCL 60 MG PO CPEP
60.0000 mg | ORAL_CAPSULE | Freq: Every day | ORAL | 3 refills | Status: DC
Start: 2018-06-18 — End: 2019-01-07

## 2018-06-19 ENCOUNTER — Encounter (INDEPENDENT_AMBULATORY_CARE_PROVIDER_SITE_OTHER): Payer: Self-pay | Admitting: Family Medicine

## 2018-06-19 ENCOUNTER — Ambulatory Visit (INDEPENDENT_AMBULATORY_CARE_PROVIDER_SITE_OTHER): Payer: Medicare Other | Admitting: Family Medicine

## 2018-06-19 VITALS — BP 161/80 | HR 70 | Temp 98.5°F | Resp 20

## 2018-06-19 DIAGNOSIS — R0981 Nasal congestion: Secondary | ICD-10-CM | POA: Diagnosis not present

## 2018-06-19 NOTE — Progress Notes (Signed)
Office Visit Note   Patient: Debra Wright           Date of Birth: 1951/09/17           MRN: [id number] Visit Date: 06/19/2018 Requested by: Eunice Blase, MD [id number] [place], [place] [address] PCP: Eunice Blase, MD  Subjective: Chief Complaint  Patient presents with  . nasal congestion/drainage x 5 days    HPI: She is here with head congestion and fever.  Symptoms started about 5 days ago with sneezing.  Then she developed head congestion with cough, and fever with sweats and chills.  In the past day or 2 she is improving.  No earache, no sore throat, no vomiting or diarrhea.  No definite sick contacts.  She took Sudafed and Mucinex with some relief.  Ordinarily she does not get sick very often.              ROS: Otherwise noncontributory  Objective: Vital Signs: BP (!) 161/80 (BP Location: Left Arm, Patient Position: Sitting, Cuff Size: Normal)   Pulse 70   Temp 98.5 F (36.9 C)   Resp 20   Physical Exam:  HEENT:  [place]/AT, PERRLA, EOM Full, no nystagmus.  Funduscopic examination within normal limits.  No conjunctival erythema.  Tympanic membranes are pearly gray with normal landmarks.  External ear canals are normal.  Nasal passages are slightly congested.  Oropharynx is clear.  No significant lymphadenopathy.  No thyromegaly or nodules.  2+ carotid pulses without bruits. CV: Regular rate and rhythm without murmurs, rubs, or gallops.  No peripheral edema.  2+ radial and posterior tibial pulses. Lungs: Clear to auscultation throughout with no wheezing or areas of consolidation.     Imaging: None today  Assessment & Plan: 1.  Head congestion with fevers, probable viral URI -She will try over-the-counter sinus rinse. -If symptoms worsen again, she will contact me and we will try an antibiotic.   Follow-Up Instructions: No follow-ups on file.       Procedures: None   PMFS History: Patient Active Problem List   Diagnosis Date Noted  . Hypertensive  cardiovascular disease 06/04/2018  . Morbid obesity with BMI of 40.0-44.9, adult ([place]) 06/04/2018  . Fatigue 06/04/2018  . Chronic anticoagulation   . Atrial fibrillation and flutter ([place]) 05/02/2018  . Atrial fibrillation with RVR ([place]) 05/02/2018  . PAF (paroxysmal atrial fibrillation) ([place]) 05/02/2018  . Unilateral primary osteoarthritis, right knee 06/14/2017  . OSA (obstructive sleep apnea) 02/13/2017  . NIDDM-type 2 10/31/2016  . Essential hypertension 08/02/2016  . Anxiety and depression 08/02/2016  . Insomnia 08/02/2016  . Arthritis of left knee 10/13/2014  . Status post total left knee replacement 10/13/2014   Past Medical History:  Diagnosis Date  . Anxiety   . Arthritis    "knees; right thumb" (10/14/2014)  . Basal cell carcinoma    "burned off upper lip & right shoulder"  . Chronic anticoagulation    CHADS VASC=3  . Depression   . Fibroid tumor   . Frequent diarrhea   . GERD (gastroesophageal reflux disease)   . High cholesterol   . Hypertension    EF 65-70% grade 2DD  . Insomnia   . Migraine    "frequently when I was younger; maybe monthly now" (10/14/2014)  . PAF (paroxysmal atrial fibrillation) ([place]) 05/02/2018   converted with rate control  . Pernicious anemia   . Sleep apnea   . Type II diabetes mellitus ([place])   . Vitamin B 12  deficiency     Family History  Problem Relation Age of Onset  . Arthritis Mother   . Hyperlipidemia Mother   . Diabetes Mother   . Hypertension Mother   . Arthritis Father   . Hyperlipidemia Father   . Heart disease Father   . Diabetes Father   . Stroke Brother   . Hypertension Brother   . Diabetes Brother   . Arthritis Maternal Grandmother   . Hyperlipidemia Maternal Grandmother   . Diabetes Maternal Grandmother   . Hypertension Maternal Grandmother   . Arthritis Maternal Grandfather   . Hyperlipidemia Maternal Grandfather   . Hypertension Maternal Grandfather   . Arthritis Paternal Grandmother   . Hyperlipidemia  Paternal Grandmother   . Diabetes Paternal Grandmother   . Hypertension Paternal Grandmother   . Arthritis Paternal Grandfather   . Hyperlipidemia Paternal Grandfather   . Diabetes Paternal Grandfather   . Hypertension Paternal Grandfather     Past Surgical History:  Procedure Laterality Date  . ABDOMINAL HYSTERECTOMY  1988  . ACHILLES TENDON SURGERY Right   . APPENDECTOMY  1988  . BILATERAL OOPHORECTOMY  2004  . CARPAL TUNNEL RELEASE Bilateral 1986  . CATARACT EXTRACTION W/ INTRAOCULAR LENS  IMPLANT, BILATERAL Bilateral 2011  . JOINT REPLACEMENT    . SHOULDER ARTHROSCOPY DISTAL CLAVICLE EXCISION AND OPEN ROTATOR CUFF REPAIR Right 11/2013  . TOENAIL EXCISION Right 04/2018   "big toe"  . TONSILLECTOMY  1950's  . TOTAL KNEE ARTHROPLASTY Left 10/13/2014   Procedure: LEFT TOTAL KNEE ARTHROPLASTY;  Surgeon: Mcarthur Rossetti, MD;  Location: [facility];  Service: Orthopedics;  Laterality: Left;   Social History   Occupational History  . Not on file  Tobacco Use  . Smoking status: Never Smoker  . Smokeless tobacco: Never Used  Substance and Sexual Activity  . Alcohol use: Yes    Comment: 10/14/2014 "might have a drink when I'm out once/month"  . Drug use: No  . Sexual activity: Not Currently

## 2018-07-22 DIAGNOSIS — M79675 Pain in left toe(s): Secondary | ICD-10-CM | POA: Diagnosis not present

## 2018-07-22 DIAGNOSIS — L600 Ingrowing nail: Secondary | ICD-10-CM | POA: Diagnosis not present

## 2018-07-22 DIAGNOSIS — M79674 Pain in right toe(s): Secondary | ICD-10-CM | POA: Diagnosis not present

## 2018-07-23 ENCOUNTER — Encounter: Payer: Self-pay | Admitting: Cardiology

## 2018-07-23 ENCOUNTER — Ambulatory Visit (INDEPENDENT_AMBULATORY_CARE_PROVIDER_SITE_OTHER): Payer: Medicare Other | Admitting: Cardiology

## 2018-07-23 VITALS — BP 166/73 | HR 66 | Ht 62.0 in | Wt 226.0 lb

## 2018-07-23 DIAGNOSIS — G4733 Obstructive sleep apnea (adult) (pediatric): Secondary | ICD-10-CM | POA: Diagnosis not present

## 2018-07-23 DIAGNOSIS — Z6841 Body Mass Index (BMI) 40.0 and over, adult: Secondary | ICD-10-CM

## 2018-07-23 DIAGNOSIS — E119 Type 2 diabetes mellitus without complications: Secondary | ICD-10-CM

## 2018-07-23 DIAGNOSIS — Z7901 Long term (current) use of anticoagulants: Secondary | ICD-10-CM | POA: Diagnosis not present

## 2018-07-23 DIAGNOSIS — I10 Essential (primary) hypertension: Secondary | ICD-10-CM

## 2018-07-23 DIAGNOSIS — I480 Paroxysmal atrial fibrillation: Secondary | ICD-10-CM | POA: Diagnosis not present

## 2018-07-23 MED ORDER — DILTIAZEM HCL ER COATED BEADS 180 MG PO CP24
180.0000 mg | ORAL_CAPSULE | Freq: Every day | ORAL | 6 refills | Status: DC
Start: 2018-07-23 — End: 2019-03-03

## 2018-07-23 NOTE — Progress Notes (Signed)
07/23/2018 Debra Wright   06-02-1951  [id number]  Primary Physician Hilts, Legrand Como, MD Primary Cardiologist: Dr Claiborne Billings  HPI:  Pleasant 67 y/o female, former CCU/TCU RN, seen in the ED 05/02/18 with palpitations and tachycardia.  The patient has a a history of hypertension, type 2 diabetes mellitus, obstructive sleep apnea on C-pap,  hyperlipidemia and anxiety. On 05/02/18 she had a steroid injection to her right knee. On returning home she had acute onset of palpitations. This was associated with chest pain and diaphoresis. The EMS tracing recorded atrial fibrillation with rapid ventricular rate. In the ED her heart rate was between 150s and 180s and she was given IV metoprolol (5 mg) and eventually converted to normal sinus rhythm. Lopressor PO was added and her Amlodipine was changed to Diltiazem CD 120mg . Echo in the hospital showed her EF to be normal with moderate LVH, grade 2DD, AOV Ca++ without stenosis, and mild to moderate LAE. OP Myoview 05/16/18 was negative for ischemia. She was seen in the office 06/04/18. She had notes some fatigue and her HR was 50. I decreased her Lopressor to 12.5 mg BID. She also has had an L-spine injecion (off Eliquis) with good results. Since her LOV she says her B/P has been running CHEN-277'O systolic.    Current Outpatient Medications  Medication Sig Dispense Refill  . atorvastatin (LIPITOR) 40 MG tablet TAKE 1 TABLET BY MOUTH ONCE DAILY 90 tablet 3  . calcium carbonate (TUMS - DOSED IN MG ELEMENTAL CALCIUM) 500 MG chewable tablet Chew 2 tablets by mouth daily as needed for indigestion or heartburn.    . Calcium Carbonate-Vitamin D (CALCIUM 600 + D PO) Take 1 tablet by mouth daily.    Marland Kitchen CINNAMON PO Take 1 tablet by mouth daily.    . clonazePAM (KLONOPIN) 0.5 MG tablet Take 0.5 tablets (0.25 mg total) by mouth at bedtime as needed for anxiety. 20 tablet 0  . diclofenac sodium (VOLTAREN) 1 % GEL Apply 4 g topically 4 (four) times daily. (Patient taking differently:  Apply 4 g topically 4 (four) times daily as needed (pain). ) 1 Tube 3  . diltiazem (CARDIZEM CD) 120 MG 24 hr capsule Take 1 capsule (120 mg total) by mouth daily. 60 capsule 5  . DULoxetine (CYMBALTA) 60 MG capsule Take 1 capsule (60 mg total) by mouth daily. 90 capsule 3  . ELIQUIS 5 MG TABS tablet TAKE 1 TABLET BY MOUTH TWICE A DAY 60 tablet 0  . Eszopiclone 3 MG TABS Take 1 tablet (3 mg total) by mouth at bedtime. Take immediately before bedtime 30 tablet 3  . GLIPIZIDE XL 5 MG 24 hr tablet TAKE 1 TABLET BY MOUTH ONCE DAILY WITH BREAKFAST 90 tablet 0  . magnesium oxide (MAG-OX) 400 (241.3 Mg) MG tablet Take 400 mg by mouth daily.    . metFORMIN (GLUCOPHAGE) 1000 MG tablet TAKE 1 TABLET BY MOUTH TWICE DAILY WITH MEALS 180 tablet 0  . metoprolol tartrate (LOPRESSOR) 25 MG tablet Take 0.5 tablets (12.5 mg total) by mouth 2 (two) times daily. 60 tablet 3  . omega-3 acid ethyl esters (LOVAZA) 1 G capsule Take 2 g by mouth 2 (two) times daily.    Marland Kitchen omeprazole (PRILOSEC) 40 MG capsule TAKE 1 CAPSULE BY MOUTH ONCE DAILY IN THE MORNING 90 capsule 3  . terbinafine (LAMISIL) 250 MG tablet Take 250 mg by mouth daily.     . traMADol (ULTRAM) 50 MG tablet Take 1 tablet (50 mg total) by mouth every  6 (six) hours as needed. (Patient taking differently: Take 50 mg by mouth every 6 (six) hours as needed for moderate pain. ) 40 tablet 0   No current facility-administered medications for this visit.     Allergies  Allergen Reactions  . Antivert [Meclizine Hcl] Other (See Comments)    "makes me feel like my skin is falling off".    Past Medical History:  Diagnosis Date  . Anxiety   . Arthritis    "knees; right thumb" (10/14/2014)  . Basal cell carcinoma    "burned off upper lip & right shoulder"  . Chronic anticoagulation    CHADS VASC=3  . Depression   . Fibroid tumor   . Frequent diarrhea   . GERD (gastroesophageal reflux disease)   . High cholesterol   . Hypertension    EF 65-70% grade 2DD  .  Insomnia   . Migraine    "frequently when I was younger; maybe monthly now" (10/14/2014)  . PAF (paroxysmal atrial fibrillation) ([place]) 05/02/2018   converted with rate control  . Pernicious anemia   . Sleep apnea   . Type II diabetes mellitus ([place])   . Vitamin B 12 deficiency     Social History   Socioeconomic History  . Marital status: Married    Spouse name: Not on file  . Number of children: Not on file  . Years of education: Not on file  . Highest education level: Not on file  Occupational History  . Not on file  Social Needs  . Financial resource strain: Not on file  . Food insecurity:    Worry: Not on file    Inability: Not on file  . Transportation needs:    Medical: Not on file    Non-medical: Not on file  Tobacco Use  . Smoking status: Never Smoker  . Smokeless tobacco: Never Used  Substance and Sexual Activity  . Alcohol use: Yes    Comment: 10/14/2014 "might have a drink when I'm out once/month"  . Drug use: No  . Sexual activity: Not Currently  Lifestyle  . Physical activity:    Days per week: Not on file    Minutes per session: Not on file  . Stress: Not on file  Relationships  . Social connections:    Talks on phone: Not on file    Gets together: Not on file    Attends religious service: Not on file    Active member of club or organization: Not on file    Attends meetings of clubs or organizations: Not on file    Relationship status: Not on file  . Intimate partner violence:    Fear of current or ex partner: Not on file    Emotionally abused: Not on file    Physically abused: Not on file    Forced sexual activity: Not on file  Other Topics Concern  . 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 Arthritis Mother   . Hyperlipidemia Mother   . Diabetes Mother   . Hypertension Mother   . Arthritis Father   . Hyperlipidemia Father   . Heart  disease Father   . Diabetes Father   . Stroke Brother   . Hypertension Brother   . Diabetes Brother   . Arthritis Maternal Grandmother   .  Hyperlipidemia Maternal Grandmother   . Diabetes Maternal Grandmother   . Hypertension Maternal Grandmother   . Arthritis Maternal Grandfather   . Hyperlipidemia Maternal Grandfather   . Hypertension Maternal Grandfather   . Arthritis Paternal Grandmother   . Hyperlipidemia Paternal Grandmother   . Diabetes Paternal Grandmother   . Hypertension Paternal Grandmother   . Arthritis Paternal Grandfather   . Hyperlipidemia Paternal Grandfather   . Diabetes Paternal Grandfather   . Hypertension Paternal Grandfather      Review of Systems: General: negative for chills, fever, night sweats or weight changes.  Cardiovascular: negative for chest pain, dyspnea on exertion, edema, orthopnea, palpitations, paroxysmal nocturnal dyspnea or shortness of breath Dermatological: negative for rash Respiratory: negative for cough or wheezing Urologic: negative for hematuria Abdominal: negative for nausea, vomiting, diarrhea, bright red blood per rectum, melena, or hematemesis Neurologic: negative for visual changes, syncope, or dizziness All other systems reviewed and are otherwise negative except as noted above.    Blood pressure (!) 166/73, pulse 66, height 5\' 2"  (1.575 m), weight 226 lb (102.5 kg).  General appearance: alert, cooperative, appears older than stated age, no distress and moderately obese Neck: no carotid bruit and no JVD Lungs: clear to auscultation bilaterally Heart: regular rate and rhythm Extremities: no edema Skin: pale, cool, dry Neurologic: Grossly normal   ASSESSMENT AND PLAN:   PAF (paroxysmal atrial fibrillation) ([place]) converted with rate control 05/02/18  Chronic anticoagulation CHADS VASC=3  Hypertensive cardiovascular disease Normal LVF with grade 2 DD and mild to moderate LAE Sub optimal B/P control since she was  changed from Amlodipine to Diltiazem.   OSA (obstructive sleep apnea) On C-pap  NIDDM-type 2 On Glucophage  Morbid obesity with BMI of 40.0-44.9, adult ([place]) .  PLAN  Increase Diltiazem to 180 mg daily. If her B/P is still not controlled I would add an ARB.   Kerin Ransom PA-C 07/23/2018 2:33 PM

## 2018-07-23 NOTE — Patient Instructions (Signed)
Medication Instructions:  INCREASE Diltiazem to 180mg  take 1 tablet once a day  If you need a refill on your cardiac medications before your next appointment, please call your pharmacy.   Lab work: None  If you have labs (blood work) drawn today and your tests are completely normal, you will receive your results only by: Marland Kitchen MyChart Message (if you have MyChart) OR . A paper copy in the mail If you have any lab test that is abnormal or we need to change your treatment, we will call you to review the results.  Testing/Procedures: None   Follow-Up: At [facility], you and your health needs are our priority.  As part of our continuing mission to provide you with exceptional heart care, we have created designated Provider Care Teams.  These Care Teams include your primary Cardiologist (physician) and Advanced Practice Providers (APPs -  Physician Assistants and Nurse Practitioners) who all work together to provide you with the care you need, when you need it. You will need a follow up appointment in 6 months.  Please call our office 2 months in advance to schedule this appointment.  You may see Shelva Majestic, MD or one of the following Advanced Practice Providers on your designated Care Team: [place], [place] . Fabian Sharp, PA-C  Any Other Special Instructions Will Be Listed Below (If Applicable). CHECK YOUR BLOOD PRESSURE DAILY FOR 1 WEEK AND CONTACT THE OFFICE VIA MYCHART WITH YOUR READINGS.

## 2018-07-29 ENCOUNTER — Other Ambulatory Visit: Payer: Self-pay | Admitting: Adult Health

## 2018-07-29 DIAGNOSIS — Z760 Encounter for issue of repeat prescription: Secondary | ICD-10-CM

## 2018-07-29 DIAGNOSIS — E118 Type 2 diabetes mellitus with unspecified complications: Secondary | ICD-10-CM

## 2018-07-31 ENCOUNTER — Telehealth (INDEPENDENT_AMBULATORY_CARE_PROVIDER_SITE_OTHER): Payer: Self-pay | Admitting: Family Medicine

## 2018-07-31 ENCOUNTER — Other Ambulatory Visit (INDEPENDENT_AMBULATORY_CARE_PROVIDER_SITE_OTHER): Payer: Self-pay | Admitting: Family Medicine

## 2018-07-31 MED ORDER — METFORMIN HCL 1000 MG PO TABS: 1000.0000 mg | ORAL_TABLET | Freq: Two times a day (BID) | ORAL | 1 refills | Status: DC

## 2018-07-31 NOTE — Telephone Encounter (Signed)
Left a message for a return call.

## 2018-07-31 NOTE — Telephone Encounter (Signed)
Patient said her [place] on [address] has requested refills for her on metformin. She would like this called into pharmacy for her. Patients # [phone]

## 2018-07-31 NOTE — Telephone Encounter (Signed)
Left message on patient's voice mail informing her that the Metformin Rx was sent in .

## 2018-07-31 NOTE — Telephone Encounter (Signed)
Needs an A1C Follow Up

## 2018-07-31 NOTE — Telephone Encounter (Signed)
Please advise 

## 2018-07-31 NOTE — Telephone Encounter (Signed)
Sent!

## 2018-08-07 ENCOUNTER — Encounter (INDEPENDENT_AMBULATORY_CARE_PROVIDER_SITE_OTHER): Payer: Self-pay | Admitting: Family Medicine

## 2018-08-07 ENCOUNTER — Ambulatory Visit (INDEPENDENT_AMBULATORY_CARE_PROVIDER_SITE_OTHER): Payer: Medicare Other | Admitting: Family Medicine

## 2018-08-07 DIAGNOSIS — E119 Type 2 diabetes mellitus without complications: Secondary | ICD-10-CM

## 2018-08-07 DIAGNOSIS — R413 Other amnesia: Secondary | ICD-10-CM | POA: Diagnosis not present

## 2018-08-07 NOTE — Progress Notes (Signed)
Office Visit Note   Patient: Debra Wright           Date of Birth: 1950/10/21           MRN: [id number] Visit Date: 08/07/2018 Requested by: Eunice Blase, MD [address] [place], [place] [address] PCP: Eunice Blase, MD  Subjective: Chief Complaint  Patient presents with  . sleeping problems, fatigue, panic attacks  . numbness end of nose, short-term memory loss    HPI: She is here for diabetes follow-up.  Blood sugars are running better.  She thinks her A1c will be better this time.  No retinopathy, neuropathy or nephropathy troubles.  In the past year she has had trouble focusing, some troubles with memory.  Some of her medications have been changed and she wonders whether that might be causing it.  She has a history of migraine headaches but has not had any in a while.  She has some auras but no actual headaches.               ROS: Otherwise noncontributory  Objective: Vital Signs: There were no vitals taken for this visit.  Physical Exam:  HEENT: Conjunctive are clear, ears are clear.  No thyromegaly or nodules, no carotid bruits. CV: Regular rate and rhythm without murmurs, rubs, or gallops.  No peripheral edema.  2+ radial and posterior tibial pulses. Lungs: Clear to auscultation throughout with no wheezing or areas of consolidation. FEET: No hypertrophic calluses.  Normal sensation to monofilament testing.  No peripheral edema.    Imaging: None today.  Assessment & Plan: 1.  Diabetes, seems to be better controlled. -Labs to evaluate.  Follow-up in 6 months.  2.  Memory change -Labs to evaluate.  If labs are normal, then we will stop Lipitor.  If that does not help, then omeprazole and Lunesta.    Follow-Up Instructions: No follow-ups on file.      Procedures: No procedures performed  No notes on file    PMFS History: Patient Active Problem List   Diagnosis Date Noted  . Hypertensive cardiovascular disease 06/04/2018  . Morbid obesity with BMI of  40.0-44.9, adult ([place]) 06/04/2018  . Fatigue 06/04/2018  . Chronic anticoagulation   . Atrial fibrillation and flutter ([place]) 05/02/2018  . Atrial fibrillation with RVR ([place]) 05/02/2018  . PAF (paroxysmal atrial fibrillation) ([place]) 05/02/2018  . Unilateral primary osteoarthritis, right knee 06/14/2017  . OSA (obstructive sleep apnea) 02/13/2017  . NIDDM-type 2 10/31/2016  . Essential hypertension 08/02/2016  . Anxiety and depression 08/02/2016  . Insomnia 08/02/2016  . Arthritis of left knee 10/13/2014  . Status post total left knee replacement 10/13/2014   Past Medical History:  Diagnosis Date  . Anxiety   . Arthritis    "knees; right thumb" (10/14/2014)  . Basal cell carcinoma    "burned off upper lip & right shoulder"  . Chronic anticoagulation    CHADS VASC=3  . Depression   . Fibroid tumor   . Frequent diarrhea   . GERD (gastroesophageal reflux disease)   . High cholesterol   . Hypertension    EF 65-70% grade 2DD  . Insomnia   . Migraine    "frequently when I was younger; maybe monthly now" (10/14/2014)  . PAF (paroxysmal atrial fibrillation) ([place]) 05/02/2018   converted with rate control  . Pernicious anemia   . Sleep apnea   . Type II diabetes mellitus ([place])   . Vitamin B 12 deficiency     Family History  Problem Relation Age of Onset  . Arthritis Mother   . Hyperlipidemia Mother   . Diabetes Mother   . Hypertension Mother   . Arthritis Father   . Hyperlipidemia Father   . Heart disease Father   . Diabetes Father   . Stroke Brother   . Hypertension Brother   . Diabetes Brother   . Arthritis Maternal Grandmother   . Hyperlipidemia Maternal Grandmother   . Diabetes Maternal Grandmother   . Hypertension Maternal Grandmother   . Arthritis Maternal Grandfather   . Hyperlipidemia Maternal Grandfather   . Hypertension Maternal Grandfather   . Arthritis Paternal Grandmother   . Hyperlipidemia Paternal Grandmother   . Diabetes Paternal Grandmother   .  Hypertension Paternal Grandmother   . Arthritis Paternal Grandfather   . Hyperlipidemia Paternal Grandfather   . Diabetes Paternal Grandfather   . Hypertension Paternal Grandfather     Past Surgical History:  Procedure Laterality Date  . ABDOMINAL HYSTERECTOMY  1988  . ACHILLES TENDON SURGERY Right   . APPENDECTOMY  1988  . BILATERAL OOPHORECTOMY  2004  . CARPAL TUNNEL RELEASE Bilateral 1986  . CATARACT EXTRACTION W/ INTRAOCULAR LENS  IMPLANT, BILATERAL Bilateral 2011  . JOINT REPLACEMENT    . SHOULDER ARTHROSCOPY DISTAL CLAVICLE EXCISION AND OPEN ROTATOR CUFF REPAIR Right 11/2013  . TOENAIL EXCISION Right 04/2018   "big toe"  . TONSILLECTOMY  1950's  . TOTAL KNEE ARTHROPLASTY Left 10/13/2014   Procedure: LEFT TOTAL KNEE ARTHROPLASTY;  Surgeon: Mcarthur Rossetti, MD;  Location: [facility];  Service: Orthopedics;  Laterality: Left;   Social History   Occupational History  . Not on file  Tobacco Use  . Smoking status: Never Smoker  . Smokeless tobacco: Never Used  Substance and Sexual Activity  . Alcohol use: Yes    Comment: 10/14/2014 "might have a drink when I'm out once/month"  . Drug use: No  . Sexual activity: Not Currently

## 2018-08-08 ENCOUNTER — Telehealth (INDEPENDENT_AMBULATORY_CARE_PROVIDER_SITE_OTHER): Payer: Self-pay | Admitting: Family Medicine

## 2018-08-08 ENCOUNTER — Telehealth: Payer: Self-pay | Admitting: Cardiology

## 2018-08-08 LAB — THYROID PANEL WITH TSH
FREE THYROXINE INDEX: 2.8 (ref 1.4–3.8)
T3 Uptake: 25 % (ref 22–35)
T4, Total: 11.3 ug/dL (ref 5.1–11.9)
TSH: 2.52 m[IU]/L (ref 0.40–4.50)

## 2018-08-08 LAB — LIPID PANEL
Cholesterol: 138 mg/dL (ref <200)
HDL: 46 mg/dL — AB (ref >50)
LDL Cholesterol (Calc): 66 mg/dL (calc)
Non-HDL Cholesterol (Calc): 92 mg/dL (calc) (ref <130)
TRIGLYCERIDES: 185 mg/dL — AB (ref <150)
Total CHOL/HDL Ratio: 3 (calc) (ref <5.0)

## 2018-08-08 LAB — CBC WITH DIFFERENTIAL/PLATELET
BASOS ABS: 38 {cells}/uL (ref 0–200)
Basophils Relative: 0.4 %
Eosinophils Absolute: 150 cells/uL (ref 15–500)
Eosinophils Relative: 1.6 %
HEMATOCRIT: 35.9 % (ref 35.0–45.0)
HEMOGLOBIN: 11.3 g/dL — AB (ref 11.7–15.5)
LYMPHS ABS: 2858 {cells}/uL (ref 850–3900)
MCH: 25.5 pg — ABNORMAL LOW (ref 27.0–33.0)
MCHC: 31.5 g/dL — AB (ref 32.0–36.0)
MCV: 81 fL (ref 80.0–100.0)
MPV: 11.4 fL (ref 7.5–12.5)
Monocytes Relative: 8.1 %
NEUTROS PCT: 59.5 %
Neutro Abs: 5593 cells/uL (ref 1500–7800)
Platelets: 302 10*3/uL (ref 140–400)
RBC: 4.43 10*6/uL (ref 3.80–5.10)
RDW: 15.2 % — AB (ref 11.0–15.0)
Total Lymphocyte: 30.4 %
WBC: 9.4 10*3/uL (ref 3.8–10.8)
WBCMIX: 761 {cells}/uL (ref 200–950)

## 2018-08-08 LAB — COMPREHENSIVE METABOLIC PANEL
AG RATIO: 2 (calc) (ref 1.0–2.5)
ALKALINE PHOSPHATASE (APISO): 77 U/L (ref 33–130)
ALT: 12 U/L (ref 6–29)
AST: 15 U/L (ref 10–35)
Albumin: 4.2 g/dL (ref 3.6–5.1)
BILIRUBIN TOTAL: 0.4 mg/dL (ref 0.2–1.2)
BUN: 13 mg/dL (ref 7–25)
CHLORIDE: 101 mmol/L (ref 98–110)
CO2: 27 mmol/L (ref 20–32)
Calcium: 9.3 mg/dL (ref 8.6–10.4)
Creat: 0.66 mg/dL (ref 0.50–0.99)
Globulin: 2.1 g/dL (calc) (ref 1.9–3.7)
Glucose, Bld: 184 mg/dL — ABNORMAL HIGH (ref 65–99)
Potassium: 4.1 mmol/L (ref 3.5–5.3)
Sodium: 137 mmol/L (ref 135–146)
Total Protein: 6.3 g/dL (ref 6.1–8.1)

## 2018-08-08 LAB — HEMOGLOBIN A1C
HEMOGLOBIN A1C: 7.3 %{Hb} — AB (ref <5.7)
MEAN PLASMA GLUCOSE: 163 (calc)
eAG (mmol/L): 9 (calc)

## 2018-08-08 LAB — MICROALBUMIN, URINE: Microalb, Ur: <0.2 mg/dL

## 2018-08-08 NOTE — Telephone Encounter (Signed)
Spoke with pt and advised that samples are available for pick up at the front desk.  Eliquis 5 mg Qty: 2 boxes Lot # [id number] Lot# 11/21

## 2018-08-08 NOTE — Telephone Encounter (Signed)
New message   Patient calling the office for samples of medication:   1.  What medication and dosage are you requesting samples for?ELIQUIS 5 MG TABS tablet  2.  Are you currently out of this medication? no

## 2018-08-08 NOTE — Telephone Encounter (Signed)
Labs are notable for the following:  Hemoglobin A1c looks better at 7.3.  Microalbumin is negative, which is good.  Keep up the good work, maintain a diet limiting intake of processed carbohydrates and sweets.  Total cholesterol is actually on the low side, and this could be contributing to memory issues.  Hemoglobin is slightly low at 11.3 suggesting anemia, possibly iron deficiency.  Here are some iron-rich foods to consider:  https://draxe.com/nutrition/top-10-iron-rich-foods/  Thyroid and other labs look good.  Regarding memory, I recommend stopping Lipitor/atorvastatin for the next 2 to 3 months to see how you feel.

## 2018-08-13 ENCOUNTER — Other Ambulatory Visit: Payer: Self-pay | Admitting: Pharmacist Clinician (PhC)/ Clinical Pharmacy Specialist

## 2018-08-13 MED ORDER — WARFARIN SODIUM 5 MG PO TABS: 5.0000 mg | ORAL_TABLET | Freq: Every day | ORAL | 3 refills | Status: DC

## 2018-08-13 NOTE — Telephone Encounter (Signed)
Patient to start warfarin 5 mg daily and continue Eliquis thru Thursday. First INR appointment Friday.

## 2018-08-16 ENCOUNTER — Encounter (INDEPENDENT_AMBULATORY_CARE_PROVIDER_SITE_OTHER): Payer: Self-pay | Admitting: Family Medicine

## 2018-08-16 ENCOUNTER — Ambulatory Visit (INDEPENDENT_AMBULATORY_CARE_PROVIDER_SITE_OTHER): Payer: Medicare Other | Admitting: Pharmacist

## 2018-08-16 DIAGNOSIS — I4891 Unspecified atrial fibrillation: Secondary | ICD-10-CM

## 2018-08-16 DIAGNOSIS — Z7901 Long term (current) use of anticoagulants: Secondary | ICD-10-CM | POA: Diagnosis not present

## 2018-08-16 LAB — POCT INR: INR: 1.2 — AB (ref 2.0–3.0)

## 2018-08-19 ENCOUNTER — Ambulatory Visit (INDEPENDENT_AMBULATORY_CARE_PROVIDER_SITE_OTHER): Payer: Medicare Other | Admitting: Physician Assistant

## 2018-08-19 ENCOUNTER — Ambulatory Visit (INDEPENDENT_AMBULATORY_CARE_PROVIDER_SITE_OTHER): Payer: Medicare Other

## 2018-08-19 ENCOUNTER — Encounter (INDEPENDENT_AMBULATORY_CARE_PROVIDER_SITE_OTHER): Payer: Self-pay | Admitting: Physician Assistant

## 2018-08-19 DIAGNOSIS — M25561 Pain in right knee: Secondary | ICD-10-CM

## 2018-08-19 DIAGNOSIS — M1711 Unilateral primary osteoarthritis, right knee: Secondary | ICD-10-CM

## 2018-08-19 MED ORDER — METHYLPREDNISOLONE ACETATE 40 MG/ML IJ SUSP
40.0000 mg | INTRAMUSCULAR | Status: AC | PRN
  Administered 2018-08-19: 40 mg via INTRA_ARTICULAR

## 2018-08-19 MED ORDER — LIDOCAINE HCL 1 % IJ SOLN
0.5000 mL | INTRAMUSCULAR | Status: AC | PRN
  Administered 2018-08-19: .5 mL

## 2018-08-19 NOTE — Progress Notes (Signed)
   Procedure Note  Patient: Debra Wright             Date of Birth: 1951/05/24           MRN: [id number]             Visit Date: 08/19/2018  Procedures: Visit Diagnoses: Acute pain of right knee - Plan: XR Knee 1-2 Views Right  Primary osteoarthritis of right knee  Large Joint Inj: R knee on 08/19/2018 4:07 PM Details: 22 G 1.5 in needle, lateral approach Medications: 0.5 mL lidocaine 1 %; 40 mg methylPREDNISolone acetate 40 MG/ML Immediately prior to procedure a time out was called to verify the correct patient, procedure, equipment, support staff and site/side marked as required. Patient was prepped and draped in the usual sterile fashion.

## 2018-08-19 NOTE — Progress Notes (Signed)
Office Visit Note   Patient: Debra Wright           Date of Birth: [birth year]           MRN: [id number] Visit Date: 08/19/2018              Requested by: Eunice Blase, MD [address] [place], [place] [address] PCP: Eunice Blase, MD   Assessment & Plan: Visit Diagnoses:  1. Acute pain of right knee   2. Primary osteoarthritis of right knee     Plan: Due to patient's continued pain in her knee despite conservative treatment which is included anti-inflammatories topically and cortisone injection in the right knee recommend right total knee arthroplasty.  Patient would like to proceed with this in the near future.  Questions encouraged and answered at length.  Risk including DVT/PE, wound healing problems, prolonged pain, worsening pain, nerve or vessel injury all discussed with patient at length.  We will see her back 2 weeks postop.  Follow-Up Instructions: Return 2 weeks post-op.   Orders:  Orders Placed This Encounter  Procedures  . Large Joint Inj: R knee  . XR Knee 1-2 Views Right   No orders of the defined types were placed in this encounter.     Procedures: No procedures performed   Clinical Data: No additional findings.   Subjective: Chief Complaint  Patient presents with  . Right Knee - Pain    HPI Debra Wright 67 year old female well-known to Dr. Ninfa Linden service comes in today due to right knee pain.  She states that the last cortisone injection did not last for long however she is asking for a repeat injection in the knee due to the fact that she is going to [facility] [place] in the doing a lot of walking.  She is been taking tramadol as needed.  Since she was last seen she was diagnosed with atrial fibrillation and is now on Coumadin.  She has been applying Voltaren gel to the knee which she states helps but is short-lived. Review of Systems She denies any recent fevers chills shortness of breath chest pain.  Objective: Vital Signs: There were no vitals  taken for this visit.  Physical Exam General: Well-developed well-nourished female no acute distress. Psych: Alert and oriented x3.  Mood and affect appropriate Ortho Exam Right knee she has full extension flexion to approximately 105 degrees.  Tenderness along medial lateral joint line.  No instability with valgus varus stressing.  No abnormal warmth erythema calf supple nontender. Specialty Comments:  No specialty comments available.  Imaging: Xr Knee 1-2 Views Right  Result Date: 08/19/2018 AP lateral views right knee: No acute fractures.  Tricompartmental arthritis right knee with near bone-on-bone medial lateral compartments osteophytes off the medial lateral tibial plateau.  Significant osteophytes off the patella.    PMFS History: Patient Active Problem List   Diagnosis Date Noted  . Long term (current) use of anticoagulants 08/16/2018  . Hypertensive cardiovascular disease 06/04/2018  . Morbid obesity with BMI of 40.0-44.9, adult ([place]) 06/04/2018  . Fatigue 06/04/2018  . Chronic anticoagulation   . Atrial fibrillation and flutter ([place]) 05/02/2018  . Atrial fibrillation with RVR ([place]) 05/02/2018  . PAF (paroxysmal atrial fibrillation) ([place]) 05/02/2018  . Primary osteoarthritis of right knee 06/14/2017  . OSA (obstructive sleep apnea) 02/13/2017  . NIDDM-type 2 10/31/2016  . Essential hypertension 08/02/2016  . Anxiety and depression 08/02/2016  . Insomnia 08/02/2016  . Arthritis of left knee 10/13/2014  . Status  post total left knee replacement 10/13/2014   Past Medical History:  Diagnosis Date  . Anxiety   . Arthritis    "knees; right thumb" (10/14/2014)  . Basal cell carcinoma    "burned off upper lip & right shoulder"  . Chronic anticoagulation    CHADS VASC=3  . Depression   . Fibroid tumor   . Frequent diarrhea   . GERD (gastroesophageal reflux disease)   . High cholesterol   . Hypertension    EF 65-70% grade 2DD  . Insomnia   . Migraine     "frequently when I was younger; maybe monthly now" (10/14/2014)  . PAF (paroxysmal atrial fibrillation) ([place]) 05/02/2018   converted with rate control  . Pernicious anemia   . Sleep apnea   . Type II diabetes mellitus ([place])   . Vitamin B 12 deficiency     Family History  Problem Relation Age of Onset  . Arthritis Mother   . Hyperlipidemia Mother   . Diabetes Mother   . Hypertension Mother   . Arthritis Father   . Hyperlipidemia Father   . Heart disease Father   . Diabetes Father   . Stroke Brother   . Hypertension Brother   . Diabetes Brother   . Arthritis Maternal Grandmother   . Hyperlipidemia Maternal Grandmother   . Diabetes Maternal Grandmother   . Hypertension Maternal Grandmother   . Arthritis Maternal Grandfather   . Hyperlipidemia Maternal Grandfather   . Hypertension Maternal Grandfather   . Arthritis Paternal Grandmother   . Hyperlipidemia Paternal Grandmother   . Diabetes Paternal Grandmother   . Hypertension Paternal Grandmother   . Arthritis Paternal Grandfather   . Hyperlipidemia Paternal Grandfather   . Diabetes Paternal Grandfather   . Hypertension Paternal Grandfather     Past Surgical History:  Procedure Laterality Date  . ABDOMINAL HYSTERECTOMY  1988  . ACHILLES TENDON SURGERY Right   . APPENDECTOMY  1988  . BILATERAL OOPHORECTOMY  2004  . CARPAL TUNNEL RELEASE Bilateral 1986  . CATARACT EXTRACTION W/ INTRAOCULAR LENS  IMPLANT, BILATERAL Bilateral 2011  . JOINT REPLACEMENT    . SHOULDER ARTHROSCOPY DISTAL CLAVICLE EXCISION AND OPEN ROTATOR CUFF REPAIR Right 11/2013  . TOENAIL EXCISION Right 04/2018   "big toe"  . TONSILLECTOMY  1950's  . TOTAL KNEE ARTHROPLASTY Left 10/13/2014   Procedure: LEFT TOTAL KNEE ARTHROPLASTY;  Surgeon: Mcarthur Rossetti, MD;  Location: [facility];  Service: Orthopedics;  Laterality: Left;   Social History   Occupational History  . Not on file  Tobacco Use  . Smoking status: Never Smoker  . Smokeless tobacco: Never  Used  Substance and Sexual Activity  . Alcohol use: Yes    Comment: 10/14/2014 "might have a drink when I'm out once/month"  . Drug use: No  . Sexual activity: Not Currently

## 2018-08-22 DIAGNOSIS — M48061 Spinal stenosis, lumbar region without neurogenic claudication: Secondary | ICD-10-CM | POA: Diagnosis not present

## 2018-08-22 DIAGNOSIS — M5136 Other intervertebral disc degeneration, lumbar region: Secondary | ICD-10-CM | POA: Diagnosis not present

## 2018-08-22 DIAGNOSIS — M4726 Other spondylosis with radiculopathy, lumbar region: Secondary | ICD-10-CM | POA: Diagnosis not present

## 2018-08-23 ENCOUNTER — Other Ambulatory Visit: Payer: Self-pay | Admitting: Adult Health

## 2018-08-23 ENCOUNTER — Other Ambulatory Visit (INDEPENDENT_AMBULATORY_CARE_PROVIDER_SITE_OTHER): Payer: Self-pay | Admitting: Family Medicine

## 2018-08-23 MED ORDER — GLIPIZIDE ER 5 MG PO TB24: ORAL_TABLET | ORAL | 3 refills | Status: DC

## 2018-08-27 NOTE — Telephone Encounter (Signed)
Denied.  No longer a patient at this practice.

## 2018-09-03 ENCOUNTER — Ambulatory Visit (INDEPENDENT_AMBULATORY_CARE_PROVIDER_SITE_OTHER): Payer: Medicare Other | Admitting: Pharmacist Clinician (PhC)/ Clinical Pharmacy Specialist

## 2018-09-03 ENCOUNTER — Other Ambulatory Visit (INDEPENDENT_AMBULATORY_CARE_PROVIDER_SITE_OTHER): Payer: Self-pay | Admitting: Family Medicine

## 2018-09-03 DIAGNOSIS — I4891 Unspecified atrial fibrillation: Secondary | ICD-10-CM | POA: Diagnosis not present

## 2018-09-03 DIAGNOSIS — Z7901 Long term (current) use of anticoagulants: Secondary | ICD-10-CM | POA: Diagnosis not present

## 2018-09-03 LAB — POCT INR: INR: 1.3 — AB (ref 2.0–3.0)

## 2018-09-03 NOTE — Patient Instructions (Signed)
Description   Take 2 tablets today Tuesday Dec 3, then increase dose to 1 tablet daily except 1.5 tablets each Monday, Wednesday and Friday.  Repeat INR in 10 days

## 2018-09-04 ENCOUNTER — Other Ambulatory Visit (INDEPENDENT_AMBULATORY_CARE_PROVIDER_SITE_OTHER): Payer: Self-pay | Admitting: Family Medicine

## 2018-09-04 MED ORDER — CLONAZEPAM 0.5 MG PO TABS: ORAL_TABLET | ORAL | 0 refills | Status: DC

## 2018-09-10 ENCOUNTER — Telehealth: Payer: Self-pay | Admitting: *Deleted

## 2018-09-10 NOTE — Telephone Encounter (Signed)
Pt received call asking her to schedule appt with Dr. Claiborne Billings for March.  Will forward to scheduling for them to call her.

## 2018-09-10 NOTE — Telephone Encounter (Signed)
Patient left voicemail of refill line stating someone has called her and was returning the call.  I do not see in Epic where she was called.  I will route this to her Card PCP and the Pharmacist.

## 2018-09-11 ENCOUNTER — Other Ambulatory Visit (INDEPENDENT_AMBULATORY_CARE_PROVIDER_SITE_OTHER): Payer: Self-pay | Admitting: Family Medicine

## 2018-09-11 ENCOUNTER — Encounter (INDEPENDENT_AMBULATORY_CARE_PROVIDER_SITE_OTHER): Payer: Self-pay | Admitting: Family Medicine

## 2018-09-11 DIAGNOSIS — Z760 Encounter for issue of repeat prescription: Secondary | ICD-10-CM

## 2018-09-11 DIAGNOSIS — G4700 Insomnia, unspecified: Secondary | ICD-10-CM

## 2018-09-11 MED ORDER — CLONAZEPAM 0.5 MG PO TABS: ORAL_TABLET | ORAL | 0 refills | Status: DC

## 2018-09-11 MED ORDER — ESZOPICLONE 3 MG PO TABS: 3.0000 mg | ORAL_TABLET | Freq: Every day | ORAL | 3 refills | Status: DC

## 2018-09-14 ENCOUNTER — Emergency Department (HOSPITAL_BASED_OUTPATIENT_CLINIC_OR_DEPARTMENT_OTHER): Payer: Medicare Other

## 2018-09-14 ENCOUNTER — Emergency Department (HOSPITAL_BASED_OUTPATIENT_CLINIC_OR_DEPARTMENT_OTHER)
Admission: EM | Admit: 2018-09-14 | Discharge: 2018-09-14 | Disposition: A | Discharge status: Home / Self Care | Payer: Medicare Other | Attending: Emergency Medicine | Admitting: Emergency Medicine

## 2018-09-14 ENCOUNTER — Encounter (HOSPITAL_BASED_OUTPATIENT_CLINIC_OR_DEPARTMENT_OTHER): Payer: Self-pay | Admitting: Emergency Medicine

## 2018-09-14 ENCOUNTER — Other Ambulatory Visit: Payer: Self-pay

## 2018-09-14 DIAGNOSIS — R05 Cough: Secondary | ICD-10-CM | POA: Diagnosis not present

## 2018-09-14 DIAGNOSIS — B9689 Other specified bacterial agents as the cause of diseases classified elsewhere: Secondary | ICD-10-CM

## 2018-09-14 DIAGNOSIS — Z7901 Long term (current) use of anticoagulants: Secondary | ICD-10-CM | POA: Diagnosis not present

## 2018-09-14 DIAGNOSIS — I10 Essential (primary) hypertension: Secondary | ICD-10-CM | POA: Diagnosis not present

## 2018-09-14 DIAGNOSIS — Z96652 Presence of left artificial knee joint: Secondary | ICD-10-CM | POA: Diagnosis not present

## 2018-09-14 DIAGNOSIS — Z79899 Other long term (current) drug therapy: Secondary | ICD-10-CM | POA: Diagnosis not present

## 2018-09-14 DIAGNOSIS — Z85828 Personal history of other malignant neoplasm of skin: Secondary | ICD-10-CM | POA: Diagnosis not present

## 2018-09-14 DIAGNOSIS — R0981 Nasal congestion: Secondary | ICD-10-CM | POA: Diagnosis present

## 2018-09-14 DIAGNOSIS — E119 Type 2 diabetes mellitus without complications: Secondary | ICD-10-CM | POA: Diagnosis not present

## 2018-09-14 DIAGNOSIS — L03011 Cellulitis of right finger: Secondary | ICD-10-CM | POA: Diagnosis not present

## 2018-09-14 DIAGNOSIS — J019 Acute sinusitis, unspecified: Secondary | ICD-10-CM | POA: Insufficient documentation

## 2018-09-14 DIAGNOSIS — Z7984 Long term (current) use of oral hypoglycemic drugs: Secondary | ICD-10-CM | POA: Diagnosis not present

## 2018-09-14 MED ORDER — AMOXICILLIN-POT CLAVULANATE 875-125 MG PO TABS: 1.0000 | ORAL_TABLET | Freq: Two times a day (BID) | ORAL | 0 refills | Status: DC

## 2018-09-14 MED ORDER — BENZONATATE 100 MG PO CAPS: 100.0000 mg | ORAL_CAPSULE | Freq: Three times a day (TID) | ORAL | 0 refills | Status: DC | PRN

## 2018-09-14 NOTE — ED Provider Notes (Signed)
[facility] EMERGENCY DEPARTMENT Provider Note   CSN: [id number] Arrival date & time: 09/14/18  1548     History   Chief Complaint Chief Complaint  Patient presents with  . Cough  . Hand Pain    HPI Debra Wright is a 67 y.o. female w PMHx PAF on coumadin, HTN, T2DM, presenting to the ED with persistent nasal congestion and cough x10days. Pt states she has significant nasal congestion, with sinus pain and pressure and headaches. She has assoc postnasal drip and a cough. Her ears feel itchy though are not painful. No fevers or difficulty swallowing or breathing. She took sudafed and mucinex, however only the mucinex helped. Pt also with complaint of infection to her right index finger. She states she noticed it 4 days ago, it began draining today. Area is red and painful.   The history is provided by the patient.    Past Medical History:  Diagnosis Date  . Anxiety   . Arthritis    "knees; right thumb" (10/14/2014)  . Basal cell carcinoma    "burned off upper lip & right shoulder"  . Chronic anticoagulation    CHADS VASC=3  . Depression   . Fibroid tumor   . Frequent diarrhea   . GERD (gastroesophageal reflux disease)   . High cholesterol   . Hypertension    EF 65-70% grade 2DD  . Insomnia   . Migraine    "frequently when I was younger; maybe monthly now" (10/14/2014)  . PAF (paroxysmal atrial fibrillation) ([place]) 05/02/2018   converted with rate control  . Pernicious anemia   . Sleep apnea   . Type II diabetes mellitus ([place])   . Vitamin B 12 deficiency     Patient Active Problem List   Diagnosis Date Noted  . Long term (current) use of anticoagulants 08/16/2018  . Hypertensive cardiovascular disease 06/04/2018  . Morbid obesity with BMI of 40.0-44.9, adult ([place]) 06/04/2018  . Fatigue 06/04/2018  . Chronic anticoagulation   . Atrial fibrillation and flutter ([place]) 05/02/2018  . Atrial fibrillation with RVR ([place]) 05/02/2018  . PAF (paroxysmal atrial  fibrillation) ([place]) 05/02/2018  . Primary osteoarthritis of right knee 06/14/2017  . OSA (obstructive sleep apnea) 02/13/2017  . NIDDM-type 2 10/31/2016  . Essential hypertension 08/02/2016  . Anxiety and depression 08/02/2016  . Insomnia 08/02/2016  . Arthritis of left knee 10/13/2014  . Status post total left knee replacement 10/13/2014    Past Surgical History:  Procedure Laterality Date  . ABDOMINAL HYSTERECTOMY  1988  . ACHILLES TENDON SURGERY Right   . APPENDECTOMY  1988  . BILATERAL OOPHORECTOMY  2004  . CARPAL TUNNEL RELEASE Bilateral 1986  . CATARACT EXTRACTION W/ INTRAOCULAR LENS  IMPLANT, BILATERAL Bilateral 2011  . JOINT REPLACEMENT    . SHOULDER ARTHROSCOPY DISTAL CLAVICLE EXCISION AND OPEN ROTATOR CUFF REPAIR Right 11/2013  . TOENAIL EXCISION Right 04/2018   "big toe"  . TONSILLECTOMY  1950's  . TOTAL KNEE ARTHROPLASTY Left 10/13/2014   Procedure: LEFT TOTAL KNEE ARTHROPLASTY;  Surgeon: Mcarthur Rossetti, MD;  Location: [facility];  Service: Orthopedics;  Laterality: Left;     OB History   No obstetric history on file.      Home Medications    Prior to Admission medications   Medication Sig Start Date End Date Taking? Authorizing Provider  amoxicillin-clavulanate (AUGMENTIN) 875-125 MG tablet Take 1 tablet by mouth every 12 (twelve) hours. 09/14/18   Detra Bores, [place] N, PA-C  atorvastatin (LIPITOR) 40 MG  tablet TAKE 1 TABLET BY MOUTH ONCE DAILY 02/19/18   Nafziger, Tommi Rumps, NP  benzonatate (TESSALON) 100 MG capsule Take 1 capsule (100 mg total) by mouth 3 (three) times daily as needed for cough. 09/14/18   Jr Milliron, [place] N, PA-C  calcium carbonate (TUMS - DOSED IN MG ELEMENTAL CALCIUM) 500 MG chewable tablet Chew 2 tablets by mouth daily as needed for indigestion or heartburn.    [provider]  Calcium Carbonate-Vitamin D (CALCIUM 600 + D PO) Take 1 tablet by mouth daily.    [provider]  CINNAMON PO Take 1 tablet by mouth daily.     [provider]  clonazePAM (KLONOPIN) 0.5 MG tablet TAKE 1/2 TAB BY MOUTH AT BEDTIME AS NEEDED 09/11/18   Hilts, Legrand Como, MD  diclofenac sodium (VOLTAREN) 1 % GEL Apply 4 g topically 4 (four) times daily. Patient taking differently: Apply 4 g topically 4 (four) times daily as needed (pain).  05/02/18   Pete Pelt, PA-C  diltiazem (CARDIZEM CD) 180 MG 24 hr capsule Take 1 capsule (180 mg total) by mouth daily. 07/23/18   Erlene Quan, PA-C  DULoxetine (CYMBALTA) 60 MG capsule Take 1 capsule (60 mg total) by mouth daily. 06/18/18   Hilts, Legrand Como, MD  Eszopiclone 3 MG TABS Take 1 tablet (3 mg total) by mouth at bedtime. Take immediately before bedtime 09/11/18   Hilts, Michael, MD  glipiZIDE (GLIPIZIDE XL) 5 MG 24 hr tablet TAKE 1 TABLET BY MOUTH ONCE DAILY WITH BREAKFAST 08/23/18   Hilts, Legrand Como, MD  magnesium oxide (MAG-OX) 400 (241.3 Mg) MG tablet Take 400 mg by mouth daily.    [provider]  metFORMIN (GLUCOPHAGE) 1000 MG tablet TAKE 1 TABLET BY MOUTH TWICE DAILY WITH MEALS 04/16/18   Nafziger, Tommi Rumps, NP  metFORMIN (GLUCOPHAGE) 1000 MG tablet Take 1 tablet (1,000 mg total) by mouth 2 (two) times daily with a meal. 07/31/18   Hilts, Legrand Como, MD  metoprolol tartrate (LOPRESSOR) 25 MG tablet Take 0.5 tablets (12.5 mg total) by mouth 2 (two) times daily. 06/04/18   Erlene Quan, PA-C  omega-3 acid ethyl esters (LOVAZA) 1 G capsule Take 2 g by mouth 2 (two) times daily.    [provider]  omeprazole (PRILOSEC) 40 MG capsule TAKE 1 CAPSULE BY MOUTH ONCE DAILY IN THE MORNING 03/05/18   Nafziger, Tommi Rumps, NP  terbinafine (LAMISIL) 250 MG tablet Take 250 mg by mouth daily.  01/30/18   [provider]  traMADol (ULTRAM) 50 MG tablet Take 1 tablet (50 mg total) by mouth every 6 (six) hours as needed. Patient taking differently: Take 50 mg by mouth every 6 (six) hours as needed for moderate pain.  05/02/18   Pete Pelt, PA-C  warfarin (COUMADIN) 5 MG tablet Take 1  tablet (5 mg total) by mouth daily. 08/13/18   Troy Sine, MD    Family History Family History  Problem Relation Age of Onset  . Arthritis Mother   . Hyperlipidemia Mother   . Diabetes Mother   . Hypertension Mother   . Arthritis Father   . Hyperlipidemia Father   . Heart disease Father   . Diabetes Father   . Stroke Brother   . Hypertension Brother   . Diabetes Brother   . Arthritis Maternal Grandmother   . Hyperlipidemia Maternal Grandmother   . Diabetes Maternal Grandmother   . Hypertension Maternal Grandmother   . Arthritis Maternal Grandfather   . Hyperlipidemia Maternal Grandfather   .  Hypertension Maternal Grandfather   . Arthritis Paternal Grandmother   . Hyperlipidemia Paternal Grandmother   . Diabetes Paternal Grandmother   . Hypertension Paternal Grandmother   . Arthritis Paternal Grandfather   . Hyperlipidemia Paternal Grandfather   . Diabetes Paternal Grandfather   . Hypertension Paternal Grandfather     Social History Social History   Tobacco Use  . Smoking status: Never Smoker  . Smokeless tobacco: Never Used  Substance Use Topics  . Alcohol use: Yes    Comment: 10/14/2014 "might have a drink when I'm out once/month"  . Drug use: No     Allergies   Antivert [meclizine hcl]   Review of Systems Review of Systems  Constitutional: Negative for fever.  HENT: Positive for congestion, postnasal drip, sinus pressure and sinus pain. Negative for ear pain and sore throat.   Respiratory: Positive for cough. Negative for shortness of breath.   Skin: Positive for color change.  All other systems reviewed and are negative.    Physical Exam Updated Vital Signs BP 140/68 (BP Location: Left Arm)   Pulse 63   Temp 99.9 F (37.7 C) (Oral)   Resp 20   Ht 5\' 3"  (1.6 m)   Wt 99.8 kg   SpO2 100%   BMI 38.97 kg/m   Physical Exam Vitals signs and nursing note reviewed.  Constitutional:      Appearance: She is well-developed. She is not  ill-appearing.  HENT:     Head: Normocephalic and atraumatic.     Right Ear: Tympanic membrane and ear canal normal.     Left Ear: Tympanic membrane and ear canal normal.     Nose: Congestion present.     Right Turbinates: Swollen.     Left Turbinates: Swollen.     Right Sinus: Maxillary sinus tenderness and frontal sinus tenderness present.     Left Sinus: Maxillary sinus tenderness and frontal sinus tenderness present.     Mouth/Throat:     Mouth: Mucous membranes are moist.     Pharynx: Oropharynx is clear. Uvula midline. No uvula swelling.  Eyes:     Conjunctiva/sclera: Conjunctivae normal.  Neck:     Musculoskeletal: Normal range of motion and neck supple.  Cardiovascular:     Rate and Rhythm: Normal rate.     Heart sounds: Normal heart sounds.  Pulmonary:     Effort: Pulmonary effort is normal.     Breath sounds: Normal breath sounds.  Abdominal:     Palpations: Abdomen is soft.  Musculoskeletal:     Comments: Right 2nd digit with draining paronychia to the medial aspect. Pad of finger is soft.   Lymphadenopathy:     Cervical: No cervical adenopathy.  Skin:    General: Skin is warm.  Neurological:     Mental Status: She is alert.  Psychiatric:        Behavior: Behavior normal.      ED Treatments / Results  Labs (all labs ordered are listed, but only abnormal results are displayed) Labs Reviewed - No data to display  EKG None  Radiology Dg Chest 2 View  Result Date: 09/14/2018 CLINICAL DATA:  Cough, congestion EXAM: CHEST - 2 VIEW COMPARISON:  05/02/2018 FINDINGS: Heart and mediastinal contours are within normal limits. No focal opacities or effusions. No acute bony abnormality. IMPRESSION: No active cardiopulmonary disease. Electronically Signed   By: Rolm Baptise M.D.   On: 09/14/2018 18:49    Procedures Procedures (including critical care time)  Medications Ordered in  ED Medications - No data to display   Initial Impression / Assessment and Plan /  ED Course  I have reviewed the triage vital signs and the nursing notes.  Pertinent labs & imaging results that were available during my care of the patient were reviewed by me and considered in my medical decision making (see chart for details).     Patient complaining of symptoms of sinusitis. Symptoms have been present for 10 days with purulent nasal discharge and maxillary sinus pain.  Lungs CTAB. CXR neg. Tolerating secretions. Concern for acute bacterial rhinosinusitis.  Patient discharged with Augmentin.  Instructions given for warm saline nasal wash. Also with actively draining paronychia to the index finger of the right hand. No felon. Recommend warm compresses. Discussed recommendations for follow-up with primary care physician.  Agreeable to plan, safe for discharge.  Discussed results, findings, treatment and follow up. Patient advised of return precautions. Patient verbalized understanding and agreed with plan.  Final Clinical Impressions(s) / ED Diagnoses   Final diagnoses:  Acute bacterial rhinosinusitis  Paronychia of finger of right hand    ED Discharge Orders         Ordered    amoxicillin-clavulanate (AUGMENTIN) 875-125 MG tablet  Every 12 hours     09/14/18 1915    benzonatate (TESSALON) 100 MG capsule  3 times daily PRN     09/14/18 1915           Milan Perkins, [place] N, PA-C 09/14/18 2341    Julianne Rice, MD 09/15/18 [phone]

## 2018-09-14 NOTE — ED Notes (Signed)
ED Provider at bedside. 

## 2018-09-14 NOTE — ED Notes (Signed)
Pt not in room when rounding

## 2018-09-14 NOTE — ED Triage Notes (Signed)
Cough x 10 days. Also c/o redness and infection to cuticle on R thumb x 4 days.

## 2018-09-14 NOTE — Discharge Instructions (Addendum)
You can take Tylenol every 4 hours as needed for headache or sinus pain. Use saline nasal spray for congestion. Take the antibiotic twice daily until gone. You can take the cough medicine, Tessalon, every 8 hours as needed for cough. Apply warm compresses to your finger, warm water soaks, to encourage it to continue to drain. Schedule an appointment with your primary care provider early this week for follow-up on your visit today.

## 2018-09-17 ENCOUNTER — Encounter (INDEPENDENT_AMBULATORY_CARE_PROVIDER_SITE_OTHER): Payer: Self-pay | Admitting: Family Medicine

## 2018-10-14 ENCOUNTER — Ambulatory Visit (INDEPENDENT_AMBULATORY_CARE_PROVIDER_SITE_OTHER): Payer: Medicare Other | Admitting: Pharmacist

## 2018-10-14 DIAGNOSIS — I4891 Unspecified atrial fibrillation: Secondary | ICD-10-CM

## 2018-10-14 DIAGNOSIS — Z7901 Long term (current) use of anticoagulants: Secondary | ICD-10-CM

## 2018-10-14 LAB — POCT INR: INR: 2.2 (ref 2.0–3.0)

## 2018-10-17 ENCOUNTER — Encounter (INDEPENDENT_AMBULATORY_CARE_PROVIDER_SITE_OTHER): Payer: Self-pay | Admitting: Family Medicine

## 2018-10-18 MED ORDER — NYSTATIN 100000 UNIT/GM EX CREA: 1.0000 "application " | TOPICAL_CREAM | Freq: Two times a day (BID) | CUTANEOUS | 3 refills | Status: DC

## 2018-10-20 ENCOUNTER — Encounter (INDEPENDENT_AMBULATORY_CARE_PROVIDER_SITE_OTHER): Payer: Self-pay | Admitting: Family Medicine

## 2018-10-21 ENCOUNTER — Encounter (INDEPENDENT_AMBULATORY_CARE_PROVIDER_SITE_OTHER): Payer: Self-pay | Admitting: Physician Assistant

## 2018-10-21 ENCOUNTER — Ambulatory Visit (INDEPENDENT_AMBULATORY_CARE_PROVIDER_SITE_OTHER): Payer: Medicare Other

## 2018-10-21 ENCOUNTER — Ambulatory Visit (INDEPENDENT_AMBULATORY_CARE_PROVIDER_SITE_OTHER): Payer: Medicare Other | Admitting: Physician Assistant

## 2018-10-21 VITALS — Ht 63.0 in | Wt 220.0 lb

## 2018-10-21 DIAGNOSIS — M25511 Pain in right shoulder: Secondary | ICD-10-CM | POA: Diagnosis not present

## 2018-10-21 MED ORDER — LIDOCAINE HCL 1 % IJ SOLN
3.0000 mL | INTRAMUSCULAR | Status: AC | PRN
  Administered 2018-10-21: 3 mL

## 2018-10-21 MED ORDER — METHYLPREDNISOLONE ACETATE 40 MG/ML IJ SUSP
40.0000 mg | INTRAMUSCULAR | Status: AC | PRN
  Administered 2018-10-21: 40 mg via INTRA_ARTICULAR

## 2018-10-21 NOTE — Progress Notes (Signed)
Office Visit Note   Patient: Debra Wright           Date of Birth: 12-30-1950           MRN: [id number] Visit Date: 10/21/2018              Requested by: Eunice Blase, MD [address] [place], [place] [address] PCP: Eunice Blase, MD   Assessment & Plan: Visit Diagnoses:  1. Acute pain of right shoulder     Plan: We will have her follow-up with [place] in 2 weeks to check her response to the injection right shoulder.  I did show her pendulum exercises, Codman exercises, forward flexion exercises and wall crawls.  If her pain does not dissipate in the next 7 to 10 days she is to call the office and we will obtain an MRI of her right shoulder to rule out rotator cuff tear.  Postinjection she did have increased range of motion of the right shoulder with decreased pain. Patient would also like to discuss right total knee arthroplasty at her next appointment.  Follow-Up Instructions: Return in about 2 weeks (around 11/04/2018).   Orders:  Orders Placed This Encounter  Procedures  . Large Joint Inj: R subacromial bursa  . XR Shoulder Right   No orders of the defined types were placed in this encounter.     Procedures: Large Joint Inj: R subacromial bursa on 10/21/2018 2:13 PM Indications: pain Details: 22 G 1.5 in needle, lateral approach  Arthrogram: No  Medications: 3 mL lidocaine 1 %; 40 mg methylPREDNISolone acetate 40 MG/ML Outcome: tolerated well, no immediate complications Procedure, treatment alternatives, risks and benefits explained, specific risks discussed. Consent was given by the patient. Immediately prior to procedure a time out was called to verify the correct patient, procedure, equipment, support staff and site/side marked as required. Patient was prepped and draped in the usual sterile fashion.       Clinical Data: No additional findings.   Subjective: Chief Complaint  Patient presents with  . Right Shoulder - Pain    Onset 4/5 days  . Right Knee -  Follow-up    Possible surgery recommended    HPI Mrs. Appenzeller returns today complaining of new onset of right shoulder pain.  She states she was changing some sheets on her bed 4 days ago and has had right shoulder pain since then unable to extend her arm out cannot tie her shoes cannot rotate her right forearm to full supination.  Denies any radicular symptoms down the right arm.  She also would like to discuss right total knee arthroplasty on setting this up. Review of Systems Please see HPI otherwise negative  Objective: Vital Signs: Ht 5\' 3"  (1.6 m)   Wt 220 lb (99.8 kg)   BMI 38.97 kg/m   Physical Exam General: Well-developed well-nourished female no acute distress mood affect appropriate Psych: Alert and oriented x3 Ortho Exam Right shoulder she has diminished external rotation against resistance.  Forward flexion actively to about 100 degrees passively and bring in the 130 degrees.  Impingement testing positive on the right extremely painful.  Pain with crossover test on the right negative on the left.  Distal biceps is intact on the right.  She does lack about 20 degrees and full supination of the right forearm.  There is no bruising about the right biceps belly she has tenderness over the bicipital groove region of the right shoulder. Specialty Comments:  No specialty comments available.  Imaging: Xr Shoulder Right  Result Date: 10/21/2018 Right shoulder 3 views: Glenohumeral joint is well-preserved.  Shoulders well located.  No acute fractures bony abnormalities.    PMFS History: Patient Active Problem List   Diagnosis Date Noted  . Long term (current) use of anticoagulants 08/16/2018  . Hypertensive cardiovascular disease 06/04/2018  . Morbid obesity with BMI of 40.0-44.9, adult ([place]) 06/04/2018  . Fatigue 06/04/2018  . Chronic anticoagulation   . Atrial fibrillation and flutter ([place]) 05/02/2018  . Atrial fibrillation with RVR ([place]) 05/02/2018  . PAF (paroxysmal atrial  fibrillation) ([place]) 05/02/2018  . Primary osteoarthritis of right knee 06/14/2017  . OSA (obstructive sleep apnea) 02/13/2017  . NIDDM-type 2 10/31/2016  . Essential hypertension 08/02/2016  . Anxiety and depression 08/02/2016  . Insomnia 08/02/2016  . Arthritis of left knee 10/13/2014  . Status post total left knee replacement 10/13/2014   Past Medical History:  Diagnosis Date  . Anxiety   . Arthritis    "knees; right thumb" (10/14/2014)  . Basal cell carcinoma    "burned off upper lip & right shoulder"  . Chronic anticoagulation    CHADS VASC=3  . Depression   . Fibroid tumor   . Frequent diarrhea   . GERD (gastroesophageal reflux disease)   . High cholesterol   . Hypertension    EF 65-70% grade 2DD  . Insomnia   . Migraine    "frequently when I was younger; maybe monthly now" (10/14/2014)  . PAF (paroxysmal atrial fibrillation) ([place]) 05/02/2018   converted with rate control  . Pernicious anemia   . Sleep apnea   . Type II diabetes mellitus ([place])   . Vitamin B 12 deficiency     Family History  Problem Relation Age of Onset  . Arthritis Mother   . Hyperlipidemia Mother   . Diabetes Mother   . Hypertension Mother   . Arthritis Father   . Hyperlipidemia Father   . Heart disease Father   . Diabetes Father   . Stroke Brother   . Hypertension Brother   . Diabetes Brother   . Arthritis Maternal Grandmother   . Hyperlipidemia Maternal Grandmother   . Diabetes Maternal Grandmother   . Hypertension Maternal Grandmother   . Arthritis Maternal Grandfather   . Hyperlipidemia Maternal Grandfather   . Hypertension Maternal Grandfather   . Arthritis Paternal Grandmother   . Hyperlipidemia Paternal Grandmother   . Diabetes Paternal Grandmother   . Hypertension Paternal Grandmother   . Arthritis Paternal Grandfather   . Hyperlipidemia Paternal Grandfather   . Diabetes Paternal Grandfather   . Hypertension Paternal Grandfather     Past Surgical History:  Procedure  Laterality Date  . ABDOMINAL HYSTERECTOMY  1988  . ACHILLES TENDON SURGERY Right   . APPENDECTOMY  1988  . BILATERAL OOPHORECTOMY  2004  . CARPAL TUNNEL RELEASE Bilateral 1986  . CATARACT EXTRACTION W/ INTRAOCULAR LENS  IMPLANT, BILATERAL Bilateral 2011  . JOINT REPLACEMENT    . SHOULDER ARTHROSCOPY DISTAL CLAVICLE EXCISION AND OPEN ROTATOR CUFF REPAIR Right 11/2013  . TOENAIL EXCISION Right 04/2018   "big toe"  . TONSILLECTOMY  1950's  . TOTAL KNEE ARTHROPLASTY Left 10/13/2014   Procedure: LEFT TOTAL KNEE ARTHROPLASTY;  Surgeon: Mcarthur Rossetti, MD;  Location: [facility];  Service: Orthopedics;  Laterality: Left;   Social History   Occupational History  . Not on file  Tobacco Use  . Smoking status: Never Smoker  . Smokeless tobacco: Never Used  Substance and Sexual Activity  .  Alcohol use: Yes    Comment: 10/14/2014 "might have a drink when I'm out once/month"  . Drug use: No  . Sexual activity: Not Currently

## 2018-10-24 DIAGNOSIS — L304 Erythema intertrigo: Secondary | ICD-10-CM | POA: Diagnosis not present

## 2018-10-24 DIAGNOSIS — L089 Local infection of the skin and subcutaneous tissue, unspecified: Secondary | ICD-10-CM | POA: Diagnosis not present

## 2018-10-24 DIAGNOSIS — L603 Nail dystrophy: Secondary | ICD-10-CM | POA: Diagnosis not present

## 2018-10-26 ENCOUNTER — Encounter (INDEPENDENT_AMBULATORY_CARE_PROVIDER_SITE_OTHER): Payer: Self-pay | Admitting: Family Medicine

## 2018-10-28 MED ORDER — DOXYCYCLINE HYCLATE 100 MG PO CAPS: 100.0000 mg | ORAL_CAPSULE | Freq: Two times a day (BID) | ORAL | 0 refills | Status: DC

## 2018-10-29 NOTE — Telephone Encounter (Signed)
Called patient on 10/29/18 per reply to [place] note to advise that we have no flu vaccine here. Advised to contact PCP office. Patient understood

## 2018-11-01 DIAGNOSIS — Z1231 Encounter for screening mammogram for malignant neoplasm of breast: Secondary | ICD-10-CM | POA: Diagnosis not present

## 2018-11-04 ENCOUNTER — Encounter (INDEPENDENT_AMBULATORY_CARE_PROVIDER_SITE_OTHER): Payer: Self-pay | Admitting: Physician Assistant

## 2018-11-04 ENCOUNTER — Encounter (INDEPENDENT_AMBULATORY_CARE_PROVIDER_SITE_OTHER): Payer: Self-pay | Admitting: Family Medicine

## 2018-11-04 ENCOUNTER — Ambulatory Visit (INDEPENDENT_AMBULATORY_CARE_PROVIDER_SITE_OTHER): Payer: Medicare Other | Admitting: Physician Assistant

## 2018-11-04 ENCOUNTER — Telehealth: Payer: Self-pay | Admitting: *Deleted

## 2018-11-04 VITALS — Ht 63.0 in | Wt 215.0 lb

## 2018-11-04 DIAGNOSIS — Z96652 Presence of left artificial knee joint: Secondary | ICD-10-CM | POA: Diagnosis not present

## 2018-11-04 DIAGNOSIS — M25511 Pain in right shoulder: Secondary | ICD-10-CM

## 2018-11-04 DIAGNOSIS — M1711 Unilateral primary osteoarthritis, right knee: Secondary | ICD-10-CM

## 2018-11-04 NOTE — Progress Notes (Signed)
Office Visit Note   Patient: Debra Wright           Date of Birth: [birth year]           MRN: [id number] Visit Date: 11/04/2018              Requested by: Debra Blase, MD [address] [place], [place] [address] PCP: Debra Blase, MD   Assessment & Plan: Visit Diagnoses:  1. Primary osteoarthritis of right knee   2. Acute pain of right shoulder     Plan: Due to the fact the patient is failed conservative treatment which is included injections, exercises, assistive devices and time he continues to have pain in the knee recommend right total knee arthroplasty.  Patient had left total knee in 2016 is done well with this.  She understands risk benefits.  Risk of infection, prolonged pain, DVT/PE and worsening pain reviewed with the patient.  She will need clearance from her cardiologist once she is obtained that she will let Debra Wright know and she will get him on schedule for right total knee arthroplasty.  Questions encouraged and answered at length. In regards to the right shoulder should continue work on range of motion strengthening.  We did show her some exercises work on supination of the right forearm. Follow-Up Instructions: Return for 2 weeks post op.   Orders:  No orders of the defined types were placed in this encounter.  No orders of the defined types were placed in this encounter.     Procedures: No procedures performed   Clinical Data: No additional findings.   Subjective: Chief Complaint  Patient presents with  . Right Shoulder - Follow-up, Pain  . Right Knee - Pain, Follow-up    HPI Debra Wright 68 year old female returns today for follow-up of her right shoulder pain.  She states that she is greater than 50% better still some soreness in the shoulder but her range of motion is greatly improved.  She still have some difficulty with supination of the right hand.  She mainly comes in today because she is ready to schedule right total knee arthroplasty.  She  had a left total knee arthroplasty back in 2016 is done well with this.  She is now having severe right knee pain that is affecting her activities of daily living and she is having to use cane to ambulate.  Cortisone injections in longer giving her much relief.  She is on Coumadin due to recent onset of A. fib she is due to see Debra Wright on 12/18/2018 for follow-up of her A. fib. Review of Systems Denies any recent fevers chills infection.  Objective: Vital Signs: Ht 5\' 3"  (1.6 m)   Wt 215 lb (97.5 kg)   BMI 38.09 kg/m   Physical Exam Constitutional:      Appearance: She is not ill-appearing or diaphoretic.  Pulmonary:     Effort: Pulmonary effort is normal.  Neurological:     Mental Status: She is alert.  Psychiatric:        Mood and Affect: Mood normal.        Behavior: Behavior normal.     Ortho Exam Left knee good range of motion without pain.  No instability valgus varus stressing of either knee.  Right knee she has tenderness along medial lateral joint line and valgus deformity.  Full extension right knee flexion to 90 degrees.  Right shoulder she has full forward flexion.  Negative impingement testing.  Biceps bilaterally are  equal and symmetric in size shape.  No ecchymosis erythema about either biceps muscle body.  She has tenderness over the right biceps muscle body.  She has full supination pronation of the left arm lacks full supination of the right arm.  Distal biceps on the right is intact and nontender. Specialty Comments:  No specialty comments available.  Imaging: No results found.   PMFS History: Patient Active Problem List   Diagnosis Date Noted  . Long term (current) use of anticoagulants 08/16/2018  . Hypertensive cardiovascular disease 06/04/2018  . Morbid obesity with BMI of 40.0-44.9, adult ([place]) 06/04/2018  . Fatigue 06/04/2018  . Chronic anticoagulation   . Atrial fibrillation and flutter ([place]) 05/02/2018  . Atrial fibrillation with RVR ([place])  05/02/2018  . PAF (paroxysmal atrial fibrillation) ([place]) 05/02/2018  . Primary osteoarthritis of right knee 06/14/2017  . OSA (obstructive sleep apnea) 02/13/2017  . NIDDM-type 2 10/31/2016  . Essential hypertension 08/02/2016  . Anxiety and depression 08/02/2016  . Insomnia 08/02/2016  . Arthritis of left knee 10/13/2014  . Status post total left knee replacement 10/13/2014   Past Medical History:  Diagnosis Date  . Anxiety   . Arthritis    "knees; right thumb" (10/14/2014)  . Basal cell carcinoma    "burned off upper lip & right shoulder"  . Chronic anticoagulation    CHADS VASC=3  . Depression   . Fibroid tumor   . Frequent diarrhea   . GERD (gastroesophageal reflux disease)   . High cholesterol   . Hypertension    EF 65-70% grade 2DD  . Insomnia   . Migraine    "frequently when I was younger; maybe monthly now" (10/14/2014)  . PAF (paroxysmal atrial fibrillation) ([place]) 05/02/2018   converted with rate control  . Pernicious anemia   . Sleep apnea   . Type II diabetes mellitus ([place])   . Vitamin B 12 deficiency     Family History  Problem Relation Age of Onset  . Arthritis Mother   . Hyperlipidemia Mother   . Diabetes Mother   . Hypertension Mother   . Arthritis Father   . Hyperlipidemia Father   . Heart disease Father   . Diabetes Father   . Stroke Brother   . Hypertension Brother   . Diabetes Brother   . Arthritis Maternal Grandmother   . Hyperlipidemia Maternal Grandmother   . Diabetes Maternal Grandmother   . Hypertension Maternal Grandmother   . Arthritis Maternal Grandfather   . Hyperlipidemia Maternal Grandfather   . Hypertension Maternal Grandfather   . Arthritis Paternal Grandmother   . Hyperlipidemia Paternal Grandmother   . Diabetes Paternal Grandmother   . Hypertension Paternal Grandmother   . Arthritis Paternal Grandfather   . Hyperlipidemia Paternal Grandfather   . Diabetes Paternal Grandfather   . Hypertension Paternal Grandfather       Past Surgical History:  Procedure Laterality Date  . ABDOMINAL HYSTERECTOMY  1988  . ACHILLES TENDON SURGERY Right   . APPENDECTOMY  1988  . BILATERAL OOPHORECTOMY  2004  . CARPAL TUNNEL RELEASE Bilateral 1986  . CATARACT EXTRACTION W/ INTRAOCULAR LENS  IMPLANT, BILATERAL Bilateral 2011  . JOINT REPLACEMENT    . SHOULDER ARTHROSCOPY DISTAL CLAVICLE EXCISION AND OPEN ROTATOR CUFF REPAIR Right 11/2013  . TOENAIL EXCISION Right 04/2018   "big toe"  . TONSILLECTOMY  1950's  . TOTAL KNEE ARTHROPLASTY Left 10/13/2014   Procedure: LEFT TOTAL KNEE ARTHROPLASTY;  Surgeon: Mcarthur Rossetti, MD;  Location: [facility];  Service: Orthopedics;  Laterality: Left;   Social History   Occupational History  . Not on file  Tobacco Use  . Smoking status: Never Smoker  . Smokeless tobacco: Never Used  Substance and Sexual Activity  . Alcohol use: Yes    Comment: 10/14/2014 "might have a drink when I'm out once/month"  . Drug use: No  . Sexual activity: Not Currently

## 2018-11-04 NOTE — Telephone Encounter (Signed)
   [facility] Pre-operative Risk Assessment    Request for surgical clearance:  1. What type of surgery is being performed? RIGHT TOTAL KNEE ARTHROPLASTY    2. When is this surgery scheduled? TBD   3. What type of clearance is required (medical clearance vs. Pharmacy clearance to hold med vs. Both)? BOTH  4. Are there any medications that need to be held prior to surgery and how long?WARFARIN   5. Practice name and name of physician performing surgery? [facility]; DR. Zollie Beckers   6. What is your office phone number [phone]    7.   What is your office fax number [phone]  8.   Anesthesia type (None, local, MAC, general) ? CHOICE    Debra Wright 11/04/2018, 4:45 PM  _________________________________________________________________   (provider comments below)

## 2018-11-05 MED ORDER — CLONAZEPAM 0.5 MG PO TABS: ORAL_TABLET | ORAL | 0 refills | Status: DC

## 2018-11-05 NOTE — Telephone Encounter (Signed)
Patient with diagnosis of afib on warfarin for anticoagulation.    Procedure: RIGHT TOTAL KNEE ARTHROPLASTY   Date of procedure: TBD  CHADS2-VASc score of  4 (CHF, HTN, AGE, DM2, stroke/tia x 2, CAD, AGE, female)  Per office protocol, patient can hold warfarin for 5 days prior to procedure.

## 2018-11-06 ENCOUNTER — Encounter (INDEPENDENT_AMBULATORY_CARE_PROVIDER_SITE_OTHER): Payer: Self-pay

## 2018-11-07 ENCOUNTER — Other Ambulatory Visit (INDEPENDENT_AMBULATORY_CARE_PROVIDER_SITE_OTHER): Payer: Self-pay | Admitting: Family Medicine

## 2018-11-07 MED ORDER — TRAMADOL HCL 50 MG PO TABS: 50.0000 mg | ORAL_TABLET | Freq: Four times a day (QID) | ORAL | 0 refills | Status: DC | PRN

## 2018-11-07 NOTE — Telephone Encounter (Signed)
LMTCB 2/6

## 2018-11-08 ENCOUNTER — Encounter (INDEPENDENT_AMBULATORY_CARE_PROVIDER_SITE_OTHER): Payer: Self-pay

## 2018-11-08 DIAGNOSIS — M48062 Spinal stenosis, lumbar region with neurogenic claudication: Secondary | ICD-10-CM | POA: Diagnosis not present

## 2018-11-08 DIAGNOSIS — M47816 Spondylosis without myelopathy or radiculopathy, lumbar region: Secondary | ICD-10-CM | POA: Diagnosis not present

## 2018-11-08 DIAGNOSIS — M5416 Radiculopathy, lumbar region: Secondary | ICD-10-CM | POA: Diagnosis not present

## 2018-11-08 DIAGNOSIS — M5136 Other intervertebral disc degeneration, lumbar region: Secondary | ICD-10-CM | POA: Diagnosis not present

## 2018-11-08 DIAGNOSIS — M1711 Unilateral primary osteoarthritis, right knee: Secondary | ICD-10-CM

## 2018-11-08 DIAGNOSIS — Z6841 Body Mass Index (BMI) 40.0 and over, adult: Secondary | ICD-10-CM | POA: Diagnosis not present

## 2018-11-08 NOTE — Telephone Encounter (Signed)
   Primary Cardiologist: Shelva Majestic, MD  Chart reviewed as part of pre-operative protocol coverage. Patient was contacted 11/08/2018 in reference to pre-operative risk assessment for pending surgery as outlined below.  Debra Wright was last seen on 07/23/18 by Kerin Ransom.  Since that day, Debra Wright has done well.  She reports no changes in her medical history and no new symptoms. Her mobility is currently limited by her knee. Prior to knee problems, she could complete greater than 4.0 METS.   Per our pharmacy staff: Patient with diagnosis of afib on warfarin for anticoagulation.    Procedure: RIGHT TOTAL KNEE ARTHROPLASTY Date of procedure: TBD  CHADS2-VASc score of  4 (CHF, HTN, AGE, DM2, stroke/tia x 2, CAD, AGE, female)  Per office protocol, patient can hold warfarin for 5 days prior to procedure.  Therefore, based on ACC/AHA guidelines, the patient would be at acceptable risk for the planned procedure without further cardiovascular testing.   I will route this recommendation to the requesting party via Epic fax function and remove from pre-op pool.  Please call with questions.  Ledora Bottcher, PA 11/08/2018, 6:41 PM

## 2018-11-08 NOTE — Telephone Encounter (Signed)
New message ° ° °Pt returning call °

## 2018-11-08 NOTE — Telephone Encounter (Signed)
Last saw Debra Wright

## 2018-11-08 NOTE — Telephone Encounter (Signed)
Medication was filled by Dr. Junius Roads yesterday.

## 2018-11-11 ENCOUNTER — Ambulatory Visit (INDEPENDENT_AMBULATORY_CARE_PROVIDER_SITE_OTHER): Payer: Medicare Other | Admitting: *Deleted

## 2018-11-11 DIAGNOSIS — Z7901 Long term (current) use of anticoagulants: Secondary | ICD-10-CM | POA: Diagnosis not present

## 2018-11-11 DIAGNOSIS — I4891 Unspecified atrial fibrillation: Secondary | ICD-10-CM

## 2018-11-11 LAB — POCT INR: INR: 2.1 (ref 2.0–3.0)

## 2018-11-11 NOTE — Patient Instructions (Addendum)
Description   Continue taking dose to 1 tablet daily except 1/2  tablets each Monday, Wednesday and Friday.  Repeat INR in 4 weeks.  Call us with surgery @ [phone].

## 2018-11-13 ENCOUNTER — Ambulatory Visit: Payer: Medicare Other | Admitting: Cardiology

## 2018-11-21 ENCOUNTER — Telehealth: Payer: Self-pay

## 2018-11-21 NOTE — Telephone Encounter (Signed)
DATE OF RIGHT KNEE REPLACEMENT IS 3/10 PLEASE ADVISE ON STOPPING COUMADIN

## 2018-11-22 ENCOUNTER — Other Ambulatory Visit (INDEPENDENT_AMBULATORY_CARE_PROVIDER_SITE_OTHER): Payer: Self-pay | Admitting: Physician Assistant

## 2018-11-25 NOTE — Telephone Encounter (Signed)
LMOM to return call.  Cleared to hold warfarin x 5 days prior to TKA on March 10.  Currently has INR check scheduled for March 9 which we will need to re-schedule.

## 2018-11-25 NOTE — Telephone Encounter (Signed)
Confirmed with patient that she is okay to hold warfarin x 5 days prior to knee replacement on March 10.  Will cancel March 9 INR check.  Patient aware to have [facility] call us for orders to monitor INR while doing rehab

## 2018-11-27 NOTE — Pre-Procedure Instructions (Signed)
Debra Wright  11/27/2018      Your procedure is scheduled on December 10, 2018.  Report to [facility] Entrance "A" at 12:10 PM  Call this number if you have problems the morning of surgery:  [phone]   Remember:  Do not eat or drink after midnight.    Take these medicines the morning of surgery with A SIP OF WATER : Diltiazem (Cardizem) Duloxetine (Cymbalta) Metoprolol (Lopressor) Omeprazole (Prilosec) Tramadol (Ultram) if needed  HOLD Warfarin (COUMADIN) for 5 days prior to surgery per your doctor.  7 days prior to surgery STOP taking any Aspirin (unless otherwise instructed by your surgeon), Doclofenac (Voltaren),  Aleve, Naproxen, Ibuprofen, Motrin, Advil, Goody's, BC's, all herbal medications, fish oil, and all vitamins.   WHAT DO I DO ABOUT MY DIABETES MEDICATION?   Marland Kitchen Do not take oral diabetes medicines (pills) the morning of surgery. DO NOT TAKE GLIPIZIDE or METFORMIN (GLUCOPHAGE) THE MORNING OF SURGERY.    How to Manage Your Diabetes Before and After Surgery  Why is it important to control my blood sugar before and after surgery? . Improving blood sugar levels before and after surgery helps healing and can limit problems. . A way of improving blood sugar control is eating a healthy diet by: o  Eating less sugar and carbohydrates o  Increasing activity/exercise o  Talking with your doctor about reaching your blood sugar goals . High blood sugars (greater than 180 mg/dL) can raise your risk of infections and slow your recovery, so you will need to focus on controlling your diabetes during the weeks before surgery. . Make sure that the doctor who takes care of your diabetes knows about your planned surgery including the date and location.  How do I manage my blood sugar before surgery? . Check your blood sugar at least 4 times a day, starting 2 days before surgery, to make sure that the level is not too high or low. o Check your blood sugar the  morning of your surgery when you wake up and every 2 hours until you get to the Short Stay unit. . If your blood sugar is less than 70 mg/dL, you will need to treat for low blood sugar: o Do not take insulin. o Treat a low blood sugar (less than 70 mg/dL) with  cup of clear juice (cranberry or apple), 4 glucose tablets, OR glucose gel. o Recheck blood sugar in 15 minutes after treatment (to make sure it is greater than 70 mg/dL). If your blood sugar is not greater than 70 mg/dL on recheck, call [phone] for further instructions. . Report your blood sugar to the short stay nurse when you get to Short Stay.  . If you are admitted to the hospital after surgery: o Your blood sugar will be checked by the staff and you will probably be given insulin after surgery (instead of oral diabetes medicines) to make sure you have good blood sugar levels. o The goal for blood sugar control after surgery is 80-180 mg/dL.      Do not wear jewelry, make-up or nail polish.  Do not wear lotions, powders, or perfumes, or deodorant.  Do not shave 48 hours prior to surgery.    Do not bring valuables to the hospital.  [facility] is not responsible for any belongings or valuables.  Contacts, dentures or bridgework may not be worn into surgery.  Leave your suitcase in the car.  After surgery it may be brought to your  room.  For patients admitted to the hospital, discharge time will be determined by your treatment team.  Patients discharged the day of surgery will not be allowed to drive home.   Special instructions:   [facility]- Preparing For Surgery  Before surgery, you can play an important role. Because skin is not sterile, your skin needs to be as free of germs as possible. You can reduce the number of germs on your skin by washing with CHG (chlorahexidine gluconate) Soap before surgery.  CHG is an antiseptic cleaner which kills germs and bonds with the skin to continue killing germs even after washing.     Oral Hygiene is also important to reduce your risk of infection.  Remember - BRUSH YOUR TEETH THE MORNING OF SURGERY WITH YOUR REGULAR TOOTHPASTE  Please do not use if you have an allergy to CHG or antibacterial soaps. If your skin becomes reddened/irritated stop using the CHG.  Do not shave (including legs and underarms) for at least 48 hours prior to first CHG shower. It is OK to shave your face.  Please follow these instructions carefully.   1. Shower the NIGHT BEFORE SURGERY and the MORNING OF SURGERY with CHG.   2. If you chose to wash your hair, wash your hair first as usual with your normal shampoo.  3. After you shampoo, rinse your hair and body thoroughly to remove the shampoo.  4. Use CHG as you would any other liquid soap. You can apply CHG directly to the skin and wash gently with a scrungie or a clean washcloth.   5. Apply the CHG Soap to your body ONLY FROM THE NECK DOWN.  Do not use on open wounds or open sores. Avoid contact with your eyes, ears, mouth and genitals (private parts). Wash Face and genitals (private parts)  with your normal soap.  6. Wash thoroughly, paying special attention to the area where your surgery will be performed.  7. Thoroughly rinse your body with warm water from the neck down.  8. DO NOT shower/wash with your normal soap after using and rinsing off the CHG Soap.  9. Pat yourself dry with a CLEAN TOWEL.  10. Wear CLEAN PAJAMAS to bed the night before surgery, wear comfortable clothes the morning of surgery  11. Place CLEAN SHEETS on your bed the night of your first shower and DO NOT SLEEP WITH PETS.    Day of Surgery:  Do not apply any deodorants/lotions.  Please wear clean clothes to the hospital/surgery center.   Remember to brush your teeth WITH YOUR REGULAR TOOTHPASTE.    Please read over the following fact sheets that you were given.

## 2018-11-28 ENCOUNTER — Telehealth (INDEPENDENT_AMBULATORY_CARE_PROVIDER_SITE_OTHER): Payer: Self-pay | Admitting: *Deleted

## 2018-11-28 ENCOUNTER — Encounter (INDEPENDENT_AMBULATORY_CARE_PROVIDER_SITE_OTHER): Payer: Self-pay

## 2018-11-28 ENCOUNTER — Inpatient Hospital Stay (HOSPITAL_COMMUNITY)
Admission: RE | Admit: 2018-11-28 | Discharge: 2018-11-28 | Disposition: A | Discharge status: Home / Self Care | Payer: Medicare Other | Source: Ambulatory Visit

## 2018-11-28 DIAGNOSIS — M1711 Unilateral primary osteoarthritis, right knee: Secondary | ICD-10-CM

## 2018-11-28 MED ORDER — HYDROCODONE-ACETAMINOPHEN 5-325 MG PO TABS: 1.0000 | ORAL_TABLET | Freq: Four times a day (QID) | ORAL | 0 refills | Status: DC | PRN

## 2018-11-29 ENCOUNTER — Telehealth (INDEPENDENT_AMBULATORY_CARE_PROVIDER_SITE_OTHER): Payer: Self-pay | Admitting: Orthopaedic Surgery

## 2018-11-29 NOTE — Telephone Encounter (Signed)
Spoke with pharmacist to clear up directions

## 2018-11-29 NOTE — Telephone Encounter (Signed)
Curt Bears with [facility] called in wanting to get clarification on a prescription for hydrocodone they received for this pt, says they received two sets of directions for the medication which she needs to clarify. [phone]

## 2018-11-29 NOTE — Telephone Encounter (Signed)
Ortho bundle requirement completed. For any questions call Jamse Arn, RN, BSN, CCM. [phone]

## 2018-11-29 NOTE — Care Plan (Signed)
Ortho Bundle Case Management Note  Patient Details  Name: Debra Wright MRN: [id number] Date of Birth: 10-Oct-1950    Spoke with patient for pre-op call. She states her husband will help her after surgery at discharge home. She has a front wheeled walker and a bedside commode already. Surgery remains scheduled for a Right total knee replacement on 12/10/18.                  DME Arranged:    DME Agency:     HH Arranged:    HH Agency:  [facility]  Additional Comments: Please contact me with any questions of if this plan should need to change.  Jamse Arn, RN, BSN, [employer]  [phone] 11/29/2018, 9:06 AM

## 2018-12-02 ENCOUNTER — Other Ambulatory Visit: Payer: Self-pay

## 2018-12-02 ENCOUNTER — Encounter (HOSPITAL_COMMUNITY)
Admission: RE | Admit: 2018-12-02 | Discharge: 2018-12-02 | Disposition: A | Discharge status: Home / Self Care | Payer: Medicare Other | Source: Ambulatory Visit | Attending: Orthopaedic Surgery | Admitting: Orthopaedic Surgery

## 2018-12-02 ENCOUNTER — Encounter (HOSPITAL_COMMUNITY): Payer: Self-pay

## 2018-12-02 DIAGNOSIS — Z01812 Encounter for preprocedural laboratory examination: Secondary | ICD-10-CM | POA: Insufficient documentation

## 2018-12-02 HISTORY — DX: Other complications of anesthesia, initial encounter: T88.59XA

## 2018-12-02 HISTORY — DX: Adverse effect of unspecified anesthetic, initial encounter: T41.45XA

## 2018-12-02 LAB — SURGICAL PCR SCREEN
MRSA, PCR: NEGATIVE
Staphylococcus aureus: POSITIVE — AB

## 2018-12-02 LAB — CBC
HCT: 38.3 % (ref 36.0–46.0)
Hemoglobin: 11.4 g/dL — ABNORMAL LOW (ref 12.0–15.0)
MCH: 24.6 pg — AB (ref 26.0–34.0)
MCHC: 29.8 g/dL — ABNORMAL LOW (ref 30.0–36.0)
MCV: 82.7 fL (ref 80.0–100.0)
Platelets: 368 10*3/uL (ref 150–400)
RBC: 4.63 MIL/uL (ref 3.87–5.11)
RDW: 15.6 % — ABNORMAL HIGH (ref 11.5–15.5)
WBC: 8.4 10*3/uL (ref 4.0–10.5)
nRBC: 0 % (ref 0.0–0.2)

## 2018-12-02 LAB — GLUCOSE, CAPILLARY: Glucose-Capillary: 140 mg/dL — ABNORMAL HIGH (ref 70–99)

## 2018-12-02 LAB — HEMOGLOBIN A1C
Hgb A1c MFr Bld: 7.4 % — ABNORMAL HIGH (ref 4.8–5.6)
MEAN PLASMA GLUCOSE: 165.68 mg/dL

## 2018-12-02 LAB — BASIC METABOLIC PANEL
Anion gap: 11 (ref 5–15)
BUN: 8 mg/dL (ref 8–23)
CO2: 21 mmol/L — ABNORMAL LOW (ref 22–32)
Calcium: 9.1 mg/dL (ref 8.9–10.3)
Chloride: 104 mmol/L (ref 98–111)
Creatinine, Ser: 0.6 mg/dL (ref 0.44–1.00)
GFR calc Af Amer: >60 mL/min (ref >60)
GFR calc non Af Amer: >60 mL/min (ref >60)
Glucose, Bld: 155 mg/dL — ABNORMAL HIGH (ref 70–99)
Potassium: 4 mmol/L (ref 3.5–5.1)
Sodium: 136 mmol/L (ref 135–145)

## 2018-12-02 NOTE — Progress Notes (Signed)
PCP - Dr. Eunice Blase Cardiologist - Dr. Shelva Majestic  Chest x-ray - 09/14/18 EKG - 06/04/18 Stress Test - 05/16/18 ECHO - 05/03/18 Cardiac Cath - denies  Sleep Study - 04/24/17 (in Epic) CPAP - yes (will bring DOS)  Fasting Blood Sugar - 130-150 Checks Blood Sugar _1____ times a day  Blood Thinner Instructions: Instructed to stop Warfarin 5 days prior to surgery. Last dose will be 12/05/18 Aspirin Instructions: N/a  Anesthesia review: no  Patient denies shortness of breath, fever, cough and chest pain at PAT appointment   Patient verbalized understanding of instructions that were given to them at the PAT appointment. Patient was also instructed that they will need to review over the PAT instructions again at home before surgery.

## 2018-12-02 NOTE — Pre-Procedure Instructions (Signed)
Debra Wright  12/02/2018    Your procedure is scheduled on Tuesday, December 10, 2018 at 2:12 PM.   Report to [facility] Entrance "A" Admitting Office at 12:10 PM.   Call this number if you have problems the morning of surgery: [phone]   Questions prior to day of surgery, please call [phone] between 8 & 4 PM.   Remember:  Do not eat or drink after midnight Monday, 12/09/18.  Take these medicines the morning of surgery with A SIP OF WATER: Diltiazem (Cardizem), Duloxetine (Cymbalta), Metoprolol (Lopressor), Omeprazole (Prilosec), Tramadol or Hydrocodone - if needed  Stop Warfarin (Coumadin) 5 days prior to surgery as instructed by Dr. Claiborne Billings. Stop Fish Oil, NSAIDS (Diclofenac, Ibuprofen, Aleve, etc) and Herbal medications 7 days prior to surgery. Do not use Aspirin products (BC Powders, Goody's, etc) 7 days prior to surgery.   How to Manage Your Diabetes Before Surgery   Why is it important to control my blood sugar before and after surgery?   Improving blood sugar levels before and after surgery helps healing and can limit problems.  A way of improving blood sugar control is eating a healthy diet by:  - Eating less sugar and carbohydrates  - Increasing activity/exercise  - Talk with your doctor about reaching your blood sugar goals  High blood sugars (greater than 180 mg/dL) can raise your risk of infections and slow down your recovery so you will need to focus on controlling your diabetes during the weeks before surgery.  Make sure that the doctor who takes care of your diabetes knows about your planned surgery including the date and location.  How do I manage my blood sugars before surgery?   Check your blood sugar at least 4 times a day, 2 days before surgery to make sure that they are not too high or low.  Check your blood sugar the morning of your surgery when you wake up and every 2 hours until you get to the Short-Stay unit.  Treat a low blood sugar  (less than 70 mg/dL) with 1/2 cup of clear juice (cranberry or apple), 4 glucose tablets, OR glucose gel.  Recheck blood sugar in 15 minutes after treatment (to make sure it is greater than 70 mg/dL).  If blood sugar is not greater than 70 mg/dL on re-check, call [phone] for further instructions.   Report your blood sugar to the Short-Stay nurse when you get to Short-Stay.  References:  [facility], 2007 "How to Manage your Diabetes Before and After Surgery".  What do I do about my diabetes medications?   Do not take oral diabetes medicines (pills) the morning of surgery..    Do not wear jewelry, make-up or nail polish.  Do not wear lotions, powders, perfumes or deodorant.  Do not shave 48 hours prior to surgery.    Do not bring valuables to the hospital.  [facility] is not responsible for any belongings or valuables.  Contacts, dentures or bridgework may not be worn into surgery.  Leave your suitcase in the car.  After surgery it may be brought to your room.  For patients admitted to the hospital, discharge time will be determined by your treatment team.  [facility] - Preparing for Surgery  Before surgery, you can play an important role.  Because skin is not sterile, your skin needs to be as free of germs as possible.  You can reduce the number of germs on you skin by  washing with CHG (chlorahexidine gluconate) soap before surgery.  CHG is an antiseptic cleaner which kills germs and bonds with the skin to continue killing germs even after washing.  Oral Hygiene is also important in reducing the risk of infection.  Remember to brush your teeth with your regular toothpaste the morning of surgery.  Please DO NOT use if you have an allergy to CHG or antibacterial soaps.  If your skin becomes reddened/irritated stop using the CHG and inform your nurse when you arrive at Short Stay.  Do not shave (including legs and underarms) for at least 48 hours prior to  the first CHG shower.  You may shave your face.  Please follow these instructions carefully:   1.  Shower with CHG Soap the night before surgery and the morning of Surgery.  2.  If you choose to wash your hair, wash your hair first as usual with your normal shampoo.  3.  After you shampoo, rinse your hair and body thoroughly to remove the shampoo. 4.  Use CHG as you would any other liquid soap.  You can apply chg directly to the skin and wash gently with a      scrungie or washcloth.           5.  Apply the CHG Soap to your body ONLY FROM THE NECK DOWN.   Do not use on open wounds or open sores. Avoid contact with your eyes, ears, mouth and genitals (private parts).  Wash genitals (private parts) with your normal soap.  6.  Wash thoroughly, paying special attention to the area where your surgery will be performed.  7.  Thoroughly rinse your body with warm water from the neck down.  8.  DO NOT shower/wash with your normal soap after using and rinsing off the CHG Soap.  9.  Pat yourself dry with a clean towel.            10.  Wear clean pajamas.            11.  Place clean sheets on your bed the night of your first shower and do not sleep with pets.  Day of Surgery  Shower as above. Do not apply any lotions/deodorants the morning of surgery.   Please wear clean clothes to the hospital. Remember to brush your teeth with toothpaste.   Please read over the fact sheets that you were given.

## 2018-12-02 NOTE — Progress Notes (Signed)
Mupirocin Ointment Rx called into [facility] on [address] for positive PCR of Staph. Pt notified of results and need to pick up Rx. Pt voiced understanding.

## 2018-12-03 ENCOUNTER — Telehealth (INDEPENDENT_AMBULATORY_CARE_PROVIDER_SITE_OTHER): Payer: Self-pay | Admitting: Family Medicine

## 2018-12-03 ENCOUNTER — Encounter (INDEPENDENT_AMBULATORY_CARE_PROVIDER_SITE_OTHER): Payer: Self-pay | Admitting: Family Medicine

## 2018-12-03 NOTE — Telephone Encounter (Signed)
I called: this was sent to [facility] by mistake. The patient wanted to use [facility], so called this in there. [place] will just delete this Rx.

## 2018-12-03 NOTE — Telephone Encounter (Signed)
Patient called this afternoon to see if Dr. Junius Roads would call her in an RX for [place] in her mouth.  She is requesting magic mouthwash in the liquid form.  She uses [facility].  CB#[phone].  Thank you.

## 2018-12-03 NOTE — Telephone Encounter (Signed)
Debra Wright with [facility] called asked what does Dr. Junius Roads want in the mouth wash and the ratio to it. The number to contact Debra Wright is [phone]

## 2018-12-03 NOTE — Telephone Encounter (Signed)
I called the Magic Mouthwash in to [facility] - equal parts of diphenhydramine, maalox, nystatin and lidocaine. I called and advised the patient.

## 2018-12-04 ENCOUNTER — Other Ambulatory Visit (INDEPENDENT_AMBULATORY_CARE_PROVIDER_SITE_OTHER): Payer: Self-pay | Admitting: Physician Assistant

## 2018-12-04 ENCOUNTER — Ambulatory Visit (INDEPENDENT_AMBULATORY_CARE_PROVIDER_SITE_OTHER): Payer: Medicare Other | Admitting: Family Medicine

## 2018-12-04 MED ORDER — FLUCONAZOLE 100 MG PO TABS: 100.0000 mg | ORAL_TABLET | Freq: Every day | ORAL | 0 refills | Status: DC

## 2018-12-10 ENCOUNTER — Encounter (HOSPITAL_COMMUNITY)
Admission: RE | Disposition: A | Discharge status: Home / Self Care | Payer: Self-pay | Source: Home / Self Care | Attending: Orthopaedic Surgery

## 2018-12-10 ENCOUNTER — Ambulatory Visit (HOSPITAL_COMMUNITY): Payer: Medicare Other | Admitting: Anesthesiology

## 2018-12-10 ENCOUNTER — Inpatient Hospital Stay (HOSPITAL_COMMUNITY)
Admission: RE | Admit: 2018-12-10 | Discharge: 2018-12-13 | DRG: 470 | Disposition: A | Discharge status: Home / Self Care | Payer: Medicare Other | Attending: Orthopaedic Surgery | Admitting: Orthopaedic Surgery

## 2018-12-10 ENCOUNTER — Encounter (HOSPITAL_COMMUNITY): Payer: Self-pay | Admitting: *Deleted

## 2018-12-10 ENCOUNTER — Inpatient Hospital Stay (HOSPITAL_COMMUNITY): Payer: Medicare Other

## 2018-12-10 DIAGNOSIS — Z7951 Long term (current) use of inhaled steroids: Secondary | ICD-10-CM

## 2018-12-10 DIAGNOSIS — Z96651 Presence of right artificial knee joint: Secondary | ICD-10-CM | POA: Diagnosis not present

## 2018-12-10 DIAGNOSIS — G4733 Obstructive sleep apnea (adult) (pediatric): Secondary | ICD-10-CM | POA: Diagnosis present

## 2018-12-10 DIAGNOSIS — I480 Paroxysmal atrial fibrillation: Secondary | ICD-10-CM | POA: Diagnosis present

## 2018-12-10 DIAGNOSIS — Z833 Family history of diabetes mellitus: Secondary | ICD-10-CM | POA: Diagnosis not present

## 2018-12-10 DIAGNOSIS — M1711 Unilateral primary osteoarthritis, right knee: Secondary | ICD-10-CM | POA: Diagnosis present

## 2018-12-10 DIAGNOSIS — K219 Gastro-esophageal reflux disease without esophagitis: Secondary | ICD-10-CM | POA: Diagnosis present

## 2018-12-10 DIAGNOSIS — D62 Acute posthemorrhagic anemia: Secondary | ICD-10-CM | POA: Diagnosis not present

## 2018-12-10 DIAGNOSIS — Z96652 Presence of left artificial knee joint: Secondary | ICD-10-CM | POA: Diagnosis present

## 2018-12-10 DIAGNOSIS — Z79891 Long term (current) use of opiate analgesic: Secondary | ICD-10-CM | POA: Diagnosis not present

## 2018-12-10 DIAGNOSIS — G4700 Insomnia, unspecified: Secondary | ICD-10-CM | POA: Diagnosis present

## 2018-12-10 DIAGNOSIS — E119 Type 2 diabetes mellitus without complications: Secondary | ICD-10-CM | POA: Diagnosis present

## 2018-12-10 DIAGNOSIS — Z8349 Family history of other endocrine, nutritional and metabolic diseases: Secondary | ICD-10-CM

## 2018-12-10 DIAGNOSIS — Z8249 Family history of ischemic heart disease and other diseases of the circulatory system: Secondary | ICD-10-CM | POA: Diagnosis not present

## 2018-12-10 DIAGNOSIS — Z7901 Long term (current) use of anticoagulants: Secondary | ICD-10-CM | POA: Diagnosis not present

## 2018-12-10 DIAGNOSIS — Z90722 Acquired absence of ovaries, bilateral: Secondary | ICD-10-CM | POA: Diagnosis not present

## 2018-12-10 DIAGNOSIS — F329 Major depressive disorder, single episode, unspecified: Secondary | ICD-10-CM | POA: Diagnosis present

## 2018-12-10 DIAGNOSIS — Z471 Aftercare following joint replacement surgery: Secondary | ICD-10-CM | POA: Diagnosis not present

## 2018-12-10 DIAGNOSIS — I10 Essential (primary) hypertension: Secondary | ICD-10-CM | POA: Diagnosis not present

## 2018-12-10 DIAGNOSIS — G8918 Other acute postprocedural pain: Secondary | ICD-10-CM | POA: Diagnosis not present

## 2018-12-10 DIAGNOSIS — Z79899 Other long term (current) drug therapy: Secondary | ICD-10-CM

## 2018-12-10 DIAGNOSIS — Z7984 Long term (current) use of oral hypoglycemic drugs: Secondary | ICD-10-CM | POA: Diagnosis not present

## 2018-12-10 DIAGNOSIS — Z90710 Acquired absence of both cervix and uterus: Secondary | ICD-10-CM

## 2018-12-10 DIAGNOSIS — F419 Anxiety disorder, unspecified: Secondary | ICD-10-CM | POA: Diagnosis present

## 2018-12-10 DIAGNOSIS — Z6841 Body Mass Index (BMI) 40.0 and over, adult: Secondary | ICD-10-CM | POA: Diagnosis not present

## 2018-12-10 DIAGNOSIS — Z888 Allergy status to other drugs, medicaments and biological substances status: Secondary | ICD-10-CM | POA: Diagnosis not present

## 2018-12-10 DIAGNOSIS — I119 Hypertensive heart disease without heart failure: Secondary | ICD-10-CM | POA: Diagnosis present

## 2018-12-10 DIAGNOSIS — Z85828 Personal history of other malignant neoplasm of skin: Secondary | ICD-10-CM

## 2018-12-10 HISTORY — PX: TOTAL KNEE ARTHROPLASTY: SHX125

## 2018-12-10 LAB — GLUCOSE, CAPILLARY
Glucose-Capillary: 140 mg/dL — ABNORMAL HIGH (ref 70–99)
Glucose-Capillary: 95 mg/dL (ref 70–99)
Glucose-Capillary: 149 mg/dL — ABNORMAL HIGH (ref 70–99)

## 2018-12-10 LAB — PROTIME-INR
INR: 1.1 (ref 0.8–1.2)
Prothrombin Time: 13.7 seconds (ref 11.4–15.2)

## 2018-12-10 SURGERY — ARTHROPLASTY, KNEE, TOTAL
Anesthesia: Monitor Anesthesia Care | Site: Knee | Laterality: Right

## 2018-12-10 MED ORDER — HYDROMORPHONE HCL 1 MG/ML IJ SOLN
0.2500 mg | Freq: Once | INTRAMUSCULAR | Status: AC
  Administered 2018-12-10: 0.25 mg via INTRAVENOUS
  Filled 2018-12-10: qty 1

## 2018-12-10 MED ORDER — ROPIVACAINE HCL 5 MG/ML IJ SOLN
INTRAMUSCULAR | Status: DC | PRN
  Administered 2018-12-10: 20 mL via EPIDURAL

## 2018-12-10 MED ORDER — CLONAZEPAM 0.25 MG PO TBDP
0.2500 mg | ORAL_TABLET | Freq: Every evening | ORAL | Status: DC | PRN
  Administered 2018-12-11 – 2018-12-12 (×2): 0.25 mg via ORAL
  Filled 2018-12-10 (×2): qty 1

## 2018-12-10 MED ORDER — PHENOL 1.4 % MT LIQD: 1.0000 | OROMUCOSAL | Status: DC | PRN

## 2018-12-10 MED ORDER — DOCUSATE SODIUM 100 MG PO CAPS
100.0000 mg | ORAL_CAPSULE | Freq: Two times a day (BID) | ORAL | Status: DC
  Administered 2018-12-11 – 2018-12-13 (×5): 100 mg via ORAL
  Filled 2018-12-10 (×5): qty 1

## 2018-12-10 MED ORDER — 0.9 % SODIUM CHLORIDE (POUR BTL) OPTIME
TOPICAL | Status: DC | PRN
  Administered 2018-12-10: 1000 mL

## 2018-12-10 MED ORDER — METHOCARBAMOL 1000 MG/10ML IJ SOLN
500.0000 mg | Freq: Four times a day (QID) | INTRAVENOUS | Status: DC | PRN
  Filled 2018-12-10: qty 5

## 2018-12-10 MED ORDER — CEFAZOLIN SODIUM-DEXTROSE 2-4 GM/100ML-% IV SOLN
2.0000 g | INTRAVENOUS | Status: AC
  Administered 2018-12-10: 2 g via INTRAVENOUS

## 2018-12-10 MED ORDER — DULOXETINE HCL 60 MG PO CPEP
60.0000 mg | ORAL_CAPSULE | Freq: Every day | ORAL | Status: DC
  Administered 2018-12-11 – 2018-12-13 (×3): 60 mg via ORAL
  Filled 2018-12-10 (×3): qty 1

## 2018-12-10 MED ORDER — METOPROLOL TARTRATE 12.5 MG HALF TABLET
12.5000 mg | ORAL_TABLET | Freq: Two times a day (BID) | ORAL | Status: DC
  Administered 2018-12-10 – 2018-12-13 (×6): 12.5 mg via ORAL
  Filled 2018-12-10 (×6): qty 1

## 2018-12-10 MED ORDER — MIDAZOLAM HCL 2 MG/2ML IJ SOLN
INTRAMUSCULAR | Status: AC
  Filled 2018-12-10: qty 2

## 2018-12-10 MED ORDER — METOCLOPRAMIDE HCL 5 MG/ML IJ SOLN: 5.0000 mg | Freq: Three times a day (TID) | INTRAMUSCULAR | Status: DC | PRN

## 2018-12-10 MED ORDER — MENTHOL 3 MG MT LOZG: 1.0000 | LOZENGE | OROMUCOSAL | Status: DC | PRN

## 2018-12-10 MED ORDER — FENTANYL CITRATE (PF) 100 MCG/2ML IJ SOLN
INTRAMUSCULAR | Status: AC
  Filled 2018-12-10: qty 2

## 2018-12-10 MED ORDER — ALUM & MAG HYDROXIDE-SIMETH 200-200-20 MG/5ML PO SUSP: 30.0000 mL | ORAL | Status: DC | PRN

## 2018-12-10 MED ORDER — DIPHENHYDRAMINE HCL 12.5 MG/5ML PO ELIX: 12.5000 mg | ORAL_SOLUTION | ORAL | Status: DC | PRN

## 2018-12-10 MED ORDER — METFORMIN HCL 500 MG PO TABS
1000.0000 mg | ORAL_TABLET | Freq: Two times a day (BID) | ORAL | Status: DC
  Administered 2018-12-11 – 2018-12-13 (×5): 1000 mg via ORAL
  Filled 2018-12-10 (×5): qty 2

## 2018-12-10 MED ORDER — HYDROMORPHONE HCL 1 MG/ML IJ SOLN
0.2500 mg | INTRAMUSCULAR | Status: DC | PRN
  Administered 2018-12-10: 0.25 mg via INTRAVENOUS

## 2018-12-10 MED ORDER — CHLORHEXIDINE GLUCONATE 4 % EX LIQD: 60.0000 mL | Freq: Once | CUTANEOUS | Status: DC

## 2018-12-10 MED ORDER — SODIUM CHLORIDE 0.9 % IV SOLN
INTRAVENOUS | Status: DC
  Administered 2018-12-10 – 2018-12-11 (×2): via INTRAVENOUS

## 2018-12-10 MED ORDER — TRANEXAMIC ACID-NACL 1000-0.7 MG/100ML-% IV SOLN
INTRAVENOUS | Status: AC
  Filled 2018-12-10: qty 100

## 2018-12-10 MED ORDER — HYDROMORPHONE HCL 1 MG/ML IJ SOLN
0.5000 mg | INTRAMUSCULAR | Status: DC | PRN
  Administered 2018-12-11 – 2018-12-12 (×5): 1 mg via INTRAVENOUS
  Filled 2018-12-10 (×6): qty 1

## 2018-12-10 MED ORDER — HYDROMORPHONE HCL 1 MG/ML IJ SOLN
INTRAMUSCULAR | Status: AC
  Filled 2018-12-10: qty 1

## 2018-12-10 MED ORDER — OXYCODONE HCL 5 MG PO TABS
5.0000 mg | ORAL_TABLET | ORAL | Status: DC | PRN
  Administered 2018-12-10 – 2018-12-13 (×7): 10 mg via ORAL
  Filled 2018-12-10 (×4): qty 2

## 2018-12-10 MED ORDER — FENTANYL CITRATE (PF) 100 MCG/2ML IJ SOLN
25.0000 ug | INTRAMUSCULAR | Status: DC | PRN
  Administered 2018-12-10 (×3): 50 ug via INTRAVENOUS

## 2018-12-10 MED ORDER — CEFAZOLIN SODIUM-DEXTROSE 1-4 GM/50ML-% IV SOLN
1.0000 g | Freq: Four times a day (QID) | INTRAVENOUS | Status: AC
  Administered 2018-12-10 – 2018-12-11 (×2): 1 g via INTRAVENOUS
  Filled 2018-12-10 (×2): qty 50

## 2018-12-10 MED ORDER — ACETAMINOPHEN 500 MG PO TABS
ORAL_TABLET | ORAL | Status: AC
  Administered 2018-12-10: 1000 mg via ORAL
  Filled 2018-12-10: qty 2

## 2018-12-10 MED ORDER — PROPOFOL 10 MG/ML IV BOLUS
INTRAVENOUS | Status: DC | PRN
  Administered 2018-12-10: 30 mg via INTRAVENOUS

## 2018-12-10 MED ORDER — CEFAZOLIN SODIUM-DEXTROSE 2-4 GM/100ML-% IV SOLN
INTRAVENOUS | Status: AC
  Filled 2018-12-10: qty 100

## 2018-12-10 MED ORDER — ATORVASTATIN CALCIUM 40 MG PO TABS
40.0000 mg | ORAL_TABLET | Freq: Every day | ORAL | Status: DC
  Administered 2018-12-10 – 2018-12-12 (×3): 40 mg via ORAL
  Filled 2018-12-10 (×3): qty 1

## 2018-12-10 MED ORDER — BUPIVACAINE IN DEXTROSE 0.75-8.25 % IT SOLN
INTRATHECAL | Status: DC | PRN
  Administered 2018-12-10: 1.6 mL via INTRATHECAL

## 2018-12-10 MED ORDER — LACTATED RINGERS IV SOLN
INTRAVENOUS | Status: DC
  Administered 2018-12-10 (×3): via INTRAVENOUS

## 2018-12-10 MED ORDER — FENTANYL CITRATE (PF) 100 MCG/2ML IJ SOLN
50.0000 ug | Freq: Once | INTRAMUSCULAR | Status: AC
  Administered 2018-12-10: 50 ug via INTRAVENOUS

## 2018-12-10 MED ORDER — GLIPIZIDE ER 5 MG PO TB24
5.0000 mg | ORAL_TABLET | Freq: Every day | ORAL | Status: DC
  Administered 2018-12-11 – 2018-12-13 (×3): 5 mg via ORAL
  Filled 2018-12-10 (×3): qty 1

## 2018-12-10 MED ORDER — FENTANYL CITRATE (PF) 250 MCG/5ML IJ SOLN
INTRAMUSCULAR | Status: AC
  Filled 2018-12-10: qty 5

## 2018-12-10 MED ORDER — ACETAMINOPHEN 325 MG PO TABS
325.0000 mg | ORAL_TABLET | Freq: Four times a day (QID) | ORAL | Status: DC | PRN
  Administered 2018-12-11 – 2018-12-13 (×2): 650 mg via ORAL
  Filled 2018-12-10 (×2): qty 2

## 2018-12-10 MED ORDER — OXYCODONE HCL 5 MG PO TABS
ORAL_TABLET | ORAL | Status: AC
  Filled 2018-12-10: qty 3

## 2018-12-10 MED ORDER — ACETAMINOPHEN 500 MG PO TABS
1000.0000 mg | ORAL_TABLET | Freq: Once | ORAL | Status: AC
  Administered 2018-12-10: 1000 mg via ORAL

## 2018-12-10 MED ORDER — POLYETHYLENE GLYCOL 3350 17 G PO PACK: 17.0000 g | PACK | Freq: Every day | ORAL | Status: DC | PRN

## 2018-12-10 MED ORDER — METHOCARBAMOL 500 MG PO TABS
ORAL_TABLET | ORAL | Status: AC
  Filled 2018-12-10: qty 1

## 2018-12-10 MED ORDER — ONDANSETRON HCL 4 MG/2ML IJ SOLN: 4.0000 mg | Freq: Four times a day (QID) | INTRAMUSCULAR | Status: DC | PRN

## 2018-12-10 MED ORDER — PROPOFOL 500 MG/50ML IV EMUL
INTRAVENOUS | Status: DC | PRN
  Administered 2018-12-10: 75 ug/kg/min via INTRAVENOUS

## 2018-12-10 MED ORDER — METOCLOPRAMIDE HCL 5 MG PO TABS: 5.0000 mg | ORAL_TABLET | Freq: Three times a day (TID) | ORAL | Status: DC | PRN

## 2018-12-10 MED ORDER — FENTANYL CITRATE (PF) 100 MCG/2ML IJ SOLN
INTRAMUSCULAR | Status: AC
  Administered 2018-12-10: 50 ug via INTRAVENOUS
  Filled 2018-12-10: qty 2

## 2018-12-10 MED ORDER — SODIUM CHLORIDE 0.9 % IR SOLN
Status: DC | PRN
  Administered 2018-12-10: 3000 mL

## 2018-12-10 MED ORDER — MAGNESIUM OXIDE 400 (241.3 MG) MG PO TABS
400.0000 mg | ORAL_TABLET | Freq: Every day | ORAL | Status: DC
  Administered 2018-12-10 – 2018-12-13 (×4): 400 mg via ORAL
  Filled 2018-12-10 (×4): qty 1

## 2018-12-10 MED ORDER — PANTOPRAZOLE SODIUM 40 MG PO TBEC
40.0000 mg | DELAYED_RELEASE_TABLET | Freq: Every day | ORAL | Status: DC
  Administered 2018-12-10 – 2018-12-13 (×4): 40 mg via ORAL
  Filled 2018-12-10 (×4): qty 1

## 2018-12-10 MED ORDER — OXYCODONE HCL 5 MG PO TABS
10.0000 mg | ORAL_TABLET | ORAL | Status: DC | PRN
  Administered 2018-12-10 – 2018-12-12 (×6): 15 mg via ORAL
  Filled 2018-12-10 (×2): qty 3
  Filled 2018-12-10: qty 2
  Filled 2018-12-10 (×3): qty 3
  Filled 2018-12-10: qty 2
  Filled 2018-12-10: qty 3

## 2018-12-10 MED ORDER — WARFARIN - PHARMACIST DOSING INPATIENT
Freq: Every day | Status: DC
  Administered 2018-12-12: 19:00:00

## 2018-12-10 MED ORDER — WARFARIN SODIUM 7.5 MG PO TABS
7.5000 mg | ORAL_TABLET | Freq: Once | ORAL | Status: DC
  Filled 2018-12-10: qty 1

## 2018-12-10 MED ORDER — OXYCODONE HCL 5 MG PO TABS
5.0000 mg | ORAL_TABLET | Freq: Once | ORAL | Status: AC | PRN
  Administered 2018-12-10: 5 mg via ORAL

## 2018-12-10 MED ORDER — TRANEXAMIC ACID-NACL 1000-0.7 MG/100ML-% IV SOLN
1000.0000 mg | INTRAVENOUS | Status: AC
  Administered 2018-12-10: 1000 mg via INTRAVENOUS

## 2018-12-10 MED ORDER — ONDANSETRON HCL 4 MG PO TABS: 4.0000 mg | ORAL_TABLET | Freq: Four times a day (QID) | ORAL | Status: DC | PRN

## 2018-12-10 MED ORDER — METHOCARBAMOL 500 MG PO TABS
500.0000 mg | ORAL_TABLET | Freq: Four times a day (QID) | ORAL | Status: DC | PRN
  Administered 2018-12-10 – 2018-12-13 (×7): 500 mg via ORAL
  Filled 2018-12-10 (×7): qty 1

## 2018-12-10 MED ORDER — DILTIAZEM HCL ER COATED BEADS 180 MG PO CP24
180.0000 mg | ORAL_CAPSULE | Freq: Every day | ORAL | Status: DC
  Administered 2018-12-11 – 2018-12-13 (×3): 180 mg via ORAL
  Filled 2018-12-10 (×3): qty 1

## 2018-12-10 MED ORDER — OXYCODONE HCL 5 MG/5ML PO SOLN: 5.0000 mg | Freq: Once | ORAL | Status: AC | PRN

## 2018-12-10 SURGICAL SUPPLY — 61 items
BANDAGE ACE 3X5.8 VEL STRL LF (GAUZE/BANDAGES/DRESSINGS) ×2 IMPLANT
BANDAGE ACE 6X5 VEL STRL LF (GAUZE/BANDAGES/DRESSINGS) ×4 IMPLANT
BANDAGE ESMARK 6X9 LF (GAUZE/BANDAGES/DRESSINGS) ×1 IMPLANT
BLADE SAG 18X100X1.27 (BLADE) ×2 IMPLANT
BNDG ESMARK 6X9 LF (GAUZE/BANDAGES/DRESSINGS) ×2
BOWL SMART MIX CTS (DISPOSABLE) ×2 IMPLANT
CHLORAPREP W/TINT 26ML (MISCELLANEOUS) ×4 IMPLANT
COVER SURGICAL LIGHT HANDLE (MISCELLANEOUS) ×2 IMPLANT
COVER WAND RF STERILE (DRAPES) ×2 IMPLANT
CUFF TOURNIQUET SINGLE 34IN LL (TOURNIQUET CUFF) ×2 IMPLANT
DRAPE EXTREMITY T 121X128X90 (DISPOSABLE) ×2 IMPLANT
DRAPE HALF SHEET 40X57 (DRAPES) ×2 IMPLANT
DRAPE U-SHAPE 47X51 STRL (DRAPES) ×2 IMPLANT
DRSG PAD ABDOMINAL 8X10 ST (GAUZE/BANDAGES/DRESSINGS) ×4 IMPLANT
ELECT CAUTERY BLADE 6.4 (BLADE) ×2 IMPLANT
ELECT REM PT RETURN 9FT ADLT (ELECTROSURGICAL) ×2
ELECTRODE REM PT RTRN 9FT ADLT (ELECTROSURGICAL) ×1 IMPLANT
FACESHIELD WRAPAROUND (MASK) ×4 IMPLANT
FEMORAL POSTERIOR SZ4 RT (Femur) ×1 IMPLANT
GAUZE SPONGE 4X4 12PLY STRL (GAUZE/BANDAGES/DRESSINGS) ×4 IMPLANT
GAUZE XEROFORM 1X8 LF (GAUZE/BANDAGES/DRESSINGS) ×2 IMPLANT
GLOVE BIOGEL PI IND STRL 8 (GLOVE) ×2 IMPLANT
GLOVE BIOGEL PI INDICATOR 8 (GLOVE) ×2
GLOVE ORTHO TXT STRL SZ7.5 (GLOVE) ×2 IMPLANT
GLOVE SURG ORTHO 8.0 STRL STRW (GLOVE) ×2 IMPLANT
GOWN STRL REUS W/ TWL LRG LVL3 (GOWN DISPOSABLE) IMPLANT
GOWN STRL REUS W/ TWL XL LVL3 (GOWN DISPOSABLE) ×2 IMPLANT
GOWN STRL REUS W/TWL LRG LVL3 (GOWN DISPOSABLE)
GOWN STRL REUS W/TWL XL LVL3 (GOWN DISPOSABLE) ×2
HANDPIECE INTERPULSE COAX TIP (DISPOSABLE) ×1
IMMOBILIZER KNEE 22 UNIV (SOFTGOODS) ×2 IMPLANT
INSERT PS TRIATH X3 #3 10 (Insert) ×2 IMPLANT
KIT BASIN OR (CUSTOM PROCEDURE TRAY) ×2 IMPLANT
KIT TURNOVER KIT B (KITS) ×2 IMPLANT
KNEE PATELLA ASYMMETRIC 9X29 (Knees) ×2 IMPLANT
KNEE TIBIAL COMPONENT SZ3 (Knees) ×2 IMPLANT
MANIFOLD NEPTUNE II (INSTRUMENTS) ×2 IMPLANT
NDL SAFETY ECLIPSE 18X1.5 (NEEDLE) IMPLANT
NEEDLE HYPO 18GX1.5 SHARP (NEEDLE)
NS IRRIG 1000ML POUR BTL (IV SOLUTION) ×2 IMPLANT
PACK TOTAL JOINT (CUSTOM PROCEDURE TRAY) ×2 IMPLANT
PAD ARMBOARD 7.5X6 YLW CONV (MISCELLANEOUS) ×2 IMPLANT
PADDING CAST COTTON 6X4 STRL (CAST SUPPLIES) ×2 IMPLANT
POSTERIOR FEMORAL SZ4 RT (Femur) ×2 IMPLANT
SET HNDPC FAN SPRY TIP SCT (DISPOSABLE) ×1 IMPLANT
SET PAD KNEE POSITIONER (MISCELLANEOUS) ×2 IMPLANT
STAPLER VISISTAT 35W (STAPLE) IMPLANT
STRIP CLOSURE SKIN 1/2X4 (GAUZE/BANDAGES/DRESSINGS) ×2 IMPLANT
SUCTION FRAZIER HANDLE 10FR (MISCELLANEOUS) ×1
SUCTION TUBE FRAZIER 10FR DISP (MISCELLANEOUS) ×1 IMPLANT
SUT MNCRL AB 4-0 PS2 18 (SUTURE) IMPLANT
SUT VIC AB 0 CT1 27 (SUTURE) ×1
SUT VIC AB 0 CT1 27XBRD ANBCTR (SUTURE) ×1 IMPLANT
SUT VIC AB 1 CT1 27 (SUTURE) ×2
SUT VIC AB 1 CT1 27XBRD ANBCTR (SUTURE) ×2 IMPLANT
SUT VIC AB 2-0 CT1 27 (SUTURE) ×2
SUT VIC AB 2-0 CT1 TAPERPNT 27 (SUTURE) ×2 IMPLANT
SYR 50ML LL SCALE MARK (SYRINGE) IMPLANT
TOWEL OR 17X26 10 PK STRL BLUE (TOWEL DISPOSABLE) ×2 IMPLANT
TRAY FOLEY MTR SLVR 16FR STAT (SET/KITS/TRAYS/PACK) ×2 IMPLANT
WRAP KNEE MAXI GEL POST OP (GAUZE/BANDAGES/DRESSINGS) ×2 IMPLANT

## 2018-12-10 NOTE — Brief Op Note (Signed)
12/10/2018  4:34 PM  PATIENT:  Debra Wright  68 y.o. female  PRE-OPERATIVE DIAGNOSIS:  right knee osteoarthritis  POST-OPERATIVE DIAGNOSIS:  right knee osteoarthritis  PROCEDURE:  Procedure(s): RIGHT TOTAL KNEE ARTHROPLASTY (Right)  SURGEON:  Surgeon(s) and Role:    Mcarthur Rossetti, MD - Primary  PHYSICIAN ASSISTANT: Benita Stabile, PA-C assisted  ANESTHESIA:   regional and spinal  COUNTS:  YES  TOURNIQUET:   Total Tourniquet Time Documented: Thigh (Right) - 45 minutes Total: Thigh (Right) - 45 minutes   DICTATION: .Other Dictation: Dictation Number [id number]  PLAN OF CARE: Admit to inpatient   PATIENT DISPOSITION:  PACU - hemodynamically stable.   Delay start of Pharmacological VTE agent (>24hrs) due to surgical blood loss or risk of bleeding: no

## 2018-12-10 NOTE — Progress Notes (Signed)
ANTICOAGULATION CONSULT NOTE - Initial Consult  Pharmacy Consult for warfarin Indication: atrial fibrillation  Patient Measurements: Height: 5\' 3"  (160 cm) Weight: 219 lb (99.3 kg) IBW/kg (Calculated) : 52.4   Vital Signs: Temp: 98.7 F (37.1 C) (03/10 1227) Temp Source: Oral (03/10 1227) BP: 137/56 (03/10 1420) Pulse Rate: 61 (03/10 1420)  Labs: Recent Labs    12/10/18 1306  LABPROT 13.7  INR 1.1    Assessment: Admitted for total knee arthroplasty - completed today. Warfarin held for procedure, last dose on 3/3. Current INR 1.1. To resume warfarin this evening.  PTA warfarin dose: 5 mg on Mon/Wed/Fri, 7.5 mg on all other days  Goal of Therapy:  INR 2-3 Monitor platelets by anticoagulation protocol: Yes   Plan:  -warfarin 7.5 mg po x1 -daily INR   Harvel Quale 12/10/2018,5:01 PM

## 2018-12-10 NOTE — Anesthesia Procedure Notes (Signed)
Spinal  Patient location during procedure: OR Start time: 12/10/2018 3:19 PM End time: 12/10/2018 3:21 PM Staffing Anesthesiologist: Catalina Gravel, MD Performed: anesthesiologist  Preanesthetic Checklist Completed: patient identified, site marked, surgical consent, pre-op evaluation, timeout performed, IV checked, risks and benefits discussed and monitors and equipment checked Spinal Block Patient position: sitting Prep: site prepped and draped and DuraPrep Patient monitoring: heart rate, cardiac monitor, continuous pulse ox and blood pressure Approach: midline Location: L3-4 Injection technique: single-shot Needle Needle type: Pencan  Needle gauge: 24 G Needle length: 9 cm Additional Notes Functioning IV was confirmed and monitors were applied. Sterile prep and drape, including hand hygiene, mask and sterile gloves were used. The patient was positioned and the spine was prepped. The skin was anesthetized with lidocaine.  Free flow of clear CSF was obtained prior to injecting local anesthetic into the CSF.  The spinal needle aspirated freely following injection.  The needle was carefully withdrawn.  The patient tolerated the procedure well. Consent was obtained prior to procedure with all questions answered and concerns addressed. Risks including but not limited to bleeding, infection, nerve damage, paralysis, failed block, inadequate analgesia, allergic reaction, high spinal, itching and headache were discussed and the patient wished to proceed.   Hoy Morn, MD

## 2018-12-10 NOTE — Anesthesia Postprocedure Evaluation (Signed)
Anesthesia Post Note  Patient: Delaynee Alred  Procedure(s) Performed: RIGHT TOTAL KNEE ARTHROPLASTY (Right Knee)     Patient location during evaluation: PACU Anesthesia Type: Spinal and Regional Level of consciousness: oriented and awake and alert Pain management: pain level controlled Vital Signs Assessment: post-procedure vital signs reviewed and stable Respiratory status: spontaneous breathing, respiratory function stable and patient connected to nasal cannula oxygen Cardiovascular status: blood pressure returned to baseline and stable Postop Assessment: no headache, no backache and no apparent nausea or vomiting Anesthetic complications: no    Last Vitals:  Vitals:   12/10/18 1930 12/10/18 1959  BP: (!) 162/76 (!) 155/66  Pulse: (!) 59 (!) 56  Resp: 12 18  Temp:  36.5 C  SpO2: 100% 100%    Last Pain:  Vitals:   12/10/18 2025  TempSrc:   PainSc: 10-Worst pain ever                 Ryan P Ellender

## 2018-12-10 NOTE — Anesthesia Procedure Notes (Signed)
Anesthesia Regional Block: Adductor canal block   Pre-Anesthetic Checklist: ,, timeout performed, Correct Patient, Correct Site, Correct Laterality, Correct Procedure, Correct Position, site marked, Risks and benefits discussed,  Surgical consent,  Pre-op evaluation,  At surgeon's request and post-op pain management  Laterality: Right  Prep: chloraprep       Needles:  Injection technique: Single-shot  Needle Type: Echogenic Needle     Needle Length: 9cm  Needle Gauge: 21     Additional Needles:   Narrative:  Start time: 12/10/2018 2:10 PM End time: 12/10/2018 2:18 PM Injection made incrementally with aspirations every 5 mL.  Performed by: Personally  Anesthesiologist: Albertha Ghee, MD  Additional Notes: Pt tolerated the procedure well.

## 2018-12-10 NOTE — Op Note (Signed)
[name], Wright MEDICAL RECORD [id number] ACCOUNT [id number] DATE OF BIRTH:Sep 29, 1951 FACILITY: [facility] LOCATION: [facility] PHYSICIAN:Jermani Pund Kerry Fort, MD  OPERATIVE REPORT  DATE OF PROCEDURE:  12/10/2018  PREOPERATIVE DIAGNOSES:  Primary osteoarthritis and degenerative joint disease, right knee.  POSTOPERATIVE DIAGNOSES:  Primary osteoarthritis and degenerative joint disease, right knee.  PROCEDURE:  Right total knee arthroplasty.  IMPLANTS:  Stryker press-fit knee system with size 4 femur, size 3 tibial tray, 10 mm fixed-bearing polyethylene insert, size 29 press-fit patellar button.  SURGEON:  Lind Guest. Ninfa Linden, MD  ASSISTANT:  Erskine Emery, PA-C  ANESTHESIA: 1.  Right lower extremity adductor canal block. 2.  Spinal.  TOURNIQUET TIME:  Less than 1 hour.  ESTIMATED BLOOD LOSS:  100 mL.  ANTIBIOTICS:  Two grams IV Ancef.  COMPLICATIONS:  None.  INDICATIONS:  The patient is a very pleasant 68 year old female with debilitating arthritis involving the right knee.  It is well documented.  In 2016, she actually underwent a successful left total knee arthroplasty that we did.  For her right knee, we  followed her closely for many years now.  Her pain has become daily and has detrimentally affected her mobility, quality of life and her activities of daily living, to the point she does wish to proceed with total knee arthroplasty on the right side.   Having had this done before, she is fully aware of the risk of acute blood loss anemia, nerve or vessel injury, fracture, infection, DVT, and implant failure.  She understands her goals are to decrease pain, improve mobility and overall improve quality  of life.  Her x-rays do show severe end-stage arthritis of her right knee.  DESCRIPTION OF PROCEDURE:  After informed consent was obtained and the right knee was marked, an adductor canal block was obtained in the holding room of her right lower extremity.  She was  then brought to the operating room and sat up on the operating  table.  Spinal anesthesia was then obtained.  She was then laid in supine position.  A Foley catheter was placed, and a nonsterile tourniquet was placed around her upper right thigh, and her right thigh, knee, leg, ankle, and foot were prepped and draped  with ChloraPrep and sterile drapes.  A time-out was called, and she was identified as correct patient, correct right knee.  We then used an Esmarch to wrap that leg, and tourniquet was inflated to 300 mm of pressure.  I made a direct midline incision  over the patella, and, of note, her leg significantly externally rotated.  We dissected down the knee joint and carried out a medial parapatellar arthrotomy, finding a very large joint effusion.  We found significant osteoarthritis throughout her right  knee.  We removed remnants of the ACL, PCL, medial and lateral meniscus as well as osteophytes throughout the knee.  With the knee in a flexed position, we used the extramedullary cutting guide for the proximal tibia cut, setting our rotation off of the  tibial tubercle and tibial crest, correcting for varus and valgus and a neutral slope.  We made this cut for taking 9 mm off the high side.  We were pleased with that cut.  We went to the femur and used an intramedullary guide for the femur through the  notch of the femur.  We set this for a distal femur cut of an 8 mm distal femoral cut, setting the rotation at 5 degrees externally rotated.  We made that cut without difficulty.  We  brought the knee back down to full extension, and with a 9 mm extension  block, we had achieved full extension.  We then went back to the femur and put our femoral sizing guide based off the epicondylar axis and [place] line.  Based off that, we chose a size 4 femur.  We put our 4-in-1 cutting block for a size 4 femur.   We made our anterior and posterior cuts, followed by our chamfer cuts.  We then made our femoral  box cut, all for a press-fit system.  We then went back to the tibia, chose a size 3 tibia for coverage of the tibial plateau, setting again the rotation of  the tibial tubercle and the femur.  We made our keel punch with a press-fit keel system for the tibia.  We then placed our size 3 tibial trial, followed by our size 4 right femur.  We placed a 9 and then 10 mm polyethylene insert.  We were pleased with  the stability with a 10 mm insert.  We then made our patellar cut and drilled 3 holes for a press-fit patellar button, size 29.  We then removed all instrumentation from the knee.  We irrigated the knee with normal saline solution.  We then opened up our  prosthesis and placed our real pressfit Stryker tibial tray, size 3, followed by our size 4 right press-fit femur.  We placed our real 10 mm fixed-bearing polyethylene insert and press-fit our patellar button.  We were pleased with range of motion and  stability after this.  We let the tourniquet down.  Hemostasis was obtained with electrocautery.  We then closed the arthrotomy with interrupted #1 Vicryl suture, followed by 0 Vicryl in the deep tissue, 2-0 Vicryl subcutaneous tissue, 4-0 Monocryl  subcuticular stitch, and Steri-Strips on the skin.  A well-padded sterile dressing was applied.  She was taken to the recovery room in stable condition.  All final counts were correct.  There were no complications noted.  Of note, Benita Stabile, PA-C,  assisted the entire case.  His assistance was crucial for facilitating all aspects of this case.  LN/NUANCE  D:12/10/2018 T:12/10/2018 JOB:[id number]

## 2018-12-10 NOTE — Anesthesia Preprocedure Evaluation (Addendum)
Anesthesia Evaluation  Patient identified by MRN, date of birth, ID band Patient awake    Reviewed: Allergy & Precautions, NPO status , Patient's Chart, lab work & pertinent test results, reviewed documented beta blocker date and time   Airway Mallampati: II  TM Distance: >3 FB Neck ROM: Full    Dental  (+) Teeth Intact, Dental Advisory Given   Pulmonary sleep apnea ,    Pulmonary exam normal breath sounds clear to auscultation       Cardiovascular hypertension, Pt. on home beta blockers and Pt. on medications Normal cardiovascular exam+ dysrhythmias Atrial Fibrillation  Rhythm:Regular Rate:Normal     Neuro/Psych  Headaches, PSYCHIATRIC DISORDERS Anxiety Depression    GI/Hepatic GERD  ,  Endo/Other  diabetes, Type 2, Oral Hypoglycemic Agents  Renal/GU      Musculoskeletal  (+) Arthritis ,   Abdominal   Peds  Hematology  (+) Blood dyscrasia (Coumadin), , Plt 368k   Anesthesia Other Findings   Reproductive/Obstetrics                            Anesthesia Physical Anesthesia Plan  ASA: III  Anesthesia Plan: Spinal and MAC   Post-op Pain Management:  Regional for Post-op pain   Induction: Intravenous  PONV Risk Score and Plan: 3 and Ondansetron, Dexamethasone and Treatment may vary due to age or medical condition  Airway Management Planned: Simple Face Mask and Natural Airway  Additional Equipment:   Intra-op Plan:   Post-operative Plan:   Informed Consent: I have reviewed the patients History and Physical, chart, labs and discussed the procedure including the risks, benefits and alternatives for the proposed anesthesia with the patient or authorized representative who has indicated his/her understanding and acceptance.     Dental advisory given  Plan Discussed with: CRNA, Anesthesiologist and Surgeon  Anesthesia Plan Comments:       Anesthesia Quick Evaluation

## 2018-12-10 NOTE — H&P (Signed)
TOTAL KNEE ADMISSION H&P  Patient is being admitted for right total knee arthroplasty.  Subjective:  Chief Complaint:right knee pain.  HPI: Debra Wright, 68 y.o. female, has a history of pain and functional disability in the right knee due to arthritis and has failed non-surgical conservative treatments for greater than 12 weeks to includeNSAID's and/or analgesics, corticosteriod injections, viscosupplementation injections, flexibility and strengthening excercises, supervised PT with diminished ADL's post treatment, use of assistive devices, weight reduction as appropriate and activity modification.  Onset of symptoms was gradual, starting 5 years ago with gradually worsening course since that time. The patient noted no past surgery on the right knee(s).  Patient currently rates pain in the right knee(s) at 10 out of 10 with activity. Patient has night pain, worsening of pain with activity and weight bearing, pain that interferes with activities of daily living, pain with passive range of motion, crepitus and joint swelling.  Patient has evidence of subchondral sclerosis, periarticular osteophytes and joint space narrowing by imaging studies. There is no active infection.  Patient Active Problem List   Diagnosis Date Noted  . Unilateral primary osteoarthritis, right knee 12/10/2018  . Long term (current) use of anticoagulants 08/16/2018  . Hypertensive cardiovascular disease 06/04/2018  . Morbid obesity with BMI of 40.0-44.9, adult ([place]) 06/04/2018  . Fatigue 06/04/2018  . Chronic anticoagulation   . Atrial fibrillation and flutter ([place]) 05/02/2018  . Atrial fibrillation with RVR ([place]) 05/02/2018  . PAF (paroxysmal atrial fibrillation) ([place]) 05/02/2018  . Primary osteoarthritis of right knee 06/14/2017  . OSA (obstructive sleep apnea) 02/13/2017  . NIDDM-type 2 10/31/2016  . Essential hypertension 08/02/2016  . Anxiety and depression 08/02/2016  . Insomnia 08/02/2016  . Arthritis of  left knee 10/13/2014  . Status post total left knee replacement 10/13/2014   Past Medical History:  Diagnosis Date  . Anxiety   . Arthritis    "knees; right thumb" (10/14/2014)  . Basal cell carcinoma    "burned off upper lip & right shoulder"  . Chronic anticoagulation    CHADS VASC=3  . Complication of anesthesia    after spinal anesthesia for left knee replacement, bladder did "not wake up". Was incontinent for 4 days post surgery  . Depression   . Fibroid tumor   . Frequent diarrhea   . GERD (gastroesophageal reflux disease)   . High cholesterol   . Hypertension    EF 65-70% grade 2DD  . Insomnia   . Migraine    "frequently when I was younger; maybe monthly now" (10/14/2014) - none after hysterectomy  . PAF (paroxysmal atrial fibrillation) ([place]) 05/02/2018   converted with rate control  . Pernicious anemia    pt not aware of this  . Sleep apnea    uses a cpap  . Type II diabetes mellitus ([place])   . Vitamin B 12 deficiency     Past Surgical History:  Procedure Laterality Date  . ABDOMINAL HYSTERECTOMY  1988  . ACHILLES TENDON SURGERY Right   . APPENDECTOMY  1988  . BILATERAL OOPHORECTOMY  2004  . CARPAL TUNNEL RELEASE Bilateral 1986  . CATARACT EXTRACTION W/ INTRAOCULAR LENS  IMPLANT, BILATERAL Bilateral 2011  . COLONOSCOPY    . JOINT REPLACEMENT    . SHOULDER ARTHROSCOPY DISTAL CLAVICLE EXCISION AND OPEN ROTATOR CUFF REPAIR Right 11/2013  . TOENAIL EXCISION Right 04/2018   "big toe"  . TONSILLECTOMY  1950's  . TOTAL KNEE ARTHROPLASTY Left 10/13/2014   Procedure: LEFT TOTAL KNEE ARTHROPLASTY;  Surgeon:  Mcarthur Rossetti, MD;  Location: [facility];  Service: Orthopedics;  Laterality: Left;    No current facility-administered medications for this encounter.    Current Outpatient Medications  Medication Sig Dispense Refill Last Dose  . atorvastatin (LIPITOR) 40 MG tablet TAKE 1 TABLET BY MOUTH ONCE DAILY (Patient taking differently: Take 40 mg by mouth at bedtime. )  90 tablet 3 Taking  . calcium carbonate (TUMS - DOSED IN MG ELEMENTAL CALCIUM) 500 MG chewable tablet Chew 2 tablets by mouth daily as needed for indigestion or heartburn.   Taking  . Calcium Carbonate-Vitamin D (CALCIUM 600 + D PO) Take 1 tablet by mouth daily.   Taking  . CINNAMON PO Take 1 tablet by mouth daily.   Taking  . clonazePAM (KLONOPIN) 0.5 MG tablet TAKE 1/2 TAB BY MOUTH AT BEDTIME AS NEEDED (Patient taking differently: Take 0.25 mg by mouth at bedtime as needed for anxiety. ) 5 tablet 0   . diclofenac sodium (VOLTAREN) 1 % GEL Apply 4 g topically 4 (four) times daily. (Patient taking differently: Apply 4 g topically 4 (four) times daily as needed (pain). ) 1 Tube 3 Taking  . diltiazem (CARDIZEM CD) 180 MG 24 hr capsule Take 1 capsule (180 mg total) by mouth daily. 30 capsule 6 Taking  . DULoxetine (CYMBALTA) 60 MG capsule Take 1 capsule (60 mg total) by mouth daily. 90 capsule 3 Taking  . Eszopiclone 3 MG TABS Take 1 tablet (3 mg total) by mouth at bedtime. Take immediately before bedtime 30 tablet 3 Taking  . glipiZIDE (GLIPIZIDE XL) 5 MG 24 hr tablet TAKE 1 TABLET BY MOUTH ONCE DAILY WITH BREAKFAST (Patient taking differently: Take 5 mg by mouth daily with breakfast. TAKE 1 TABLET BY MOUTH ONCE DAILY WITH BREAKFAST) 90 tablet 3 Taking  . magnesium oxide (MAG-OX) 400 (241.3 Mg) MG tablet Take 400 mg by mouth daily.   Taking  . metFORMIN (GLUCOPHAGE) 1000 MG tablet Take 1 tablet (1,000 mg total) by mouth 2 (two) times daily with a meal. 180 tablet 1 Taking  . metoprolol tartrate (LOPRESSOR) 25 MG tablet Take 0.5 tablets (12.5 mg total) by mouth 2 (two) times daily. 60 tablet 3 Taking  . Omega-3 Fatty Acids (OMEGA-3 FISH OIL) 1200 MG CAPS Take 2,400 mg by mouth 2 (two) times daily.     Marland Kitchen omeprazole (PRILOSEC) 40 MG capsule TAKE 1 CAPSULE BY MOUTH ONCE DAILY IN THE MORNING (Patient taking differently: Take 40 mg by mouth every morning. ) 90 capsule 3 Taking  . traMADol (ULTRAM) 50 MG  tablet Take 1 tablet (50 mg total) by mouth every 6 (six) hours as needed. (Patient taking differently: Take 50 mg by mouth every 6 (six) hours as needed (knee pain). ) 40 tablet 0   . warfarin (COUMADIN) 5 MG tablet Take 1 tablet (5 mg total) by mouth daily. (Patient taking differently: Take 5-7.5 mg by mouth See admin instructions. Take 1 1/2 tablet (7.5mg ) on Tues, Thurs, Sat, and Sun. Take 1 tablet (5mg ) Mon, Wed, Fri) 30 tablet 3 Taking  . benzonatate (TESSALON) 100 MG capsule Take 1 capsule (100 mg total) by mouth 3 (three) times daily as needed for cough. (Patient not taking: Reported on 11/25/2018) 21 capsule 0 Not Taking at Unknown time  . fluconazole (DIFLUCAN) 100 MG tablet Take 1 tablet (100 mg total) by mouth daily. 7 tablet 0   . HYDROcodone-acetaminophen (NORCO) 5-325 MG tablet Take 1 tablet by mouth every 6 (six) hours as needed  for moderate pain. One to two tabs every 4-6 hours for pain 30 tablet 0    Allergies  Allergen Reactions  . Antivert [Meclizine Hcl] Other (See Comments)    "makes me feel like my skin is falling off".    Social History   Tobacco Use  . Smoking status: Never Smoker  . Smokeless tobacco: Never Used  Substance Use Topics  . Alcohol use: Yes    Comment: 10/14/2014 "might have a drink when I'm out once/month"    Family History  Problem Relation Age of Onset  . Arthritis Mother   . Hyperlipidemia Mother   . Diabetes Mother   . Hypertension Mother   . Arthritis Father   . Hyperlipidemia Father   . Heart disease Father   . Diabetes Father   . Stroke Brother   . Hypertension Brother   . Diabetes Brother   . Arthritis Maternal Grandmother   . Hyperlipidemia Maternal Grandmother   . Diabetes Maternal Grandmother   . Hypertension Maternal Grandmother   . Arthritis Maternal Grandfather   . Hyperlipidemia Maternal Grandfather   . Hypertension Maternal Grandfather   . Arthritis Paternal Grandmother   . Hyperlipidemia Paternal Grandmother   . Diabetes  Paternal Grandmother   . Hypertension Paternal Grandmother   . Arthritis Paternal Grandfather   . Hyperlipidemia Paternal Grandfather   . Diabetes Paternal Grandfather   . Hypertension Paternal Grandfather      Review of Systems  Musculoskeletal: Positive for joint pain.  All other systems reviewed and are negative.   Objective:  Physical Exam  Constitutional: She is oriented to person, place, and time. She appears well-developed and well-nourished.  HENT:  Head: Normocephalic and atraumatic.  Eyes: Pupils are equal, round, and reactive to light. EOM are normal.  Neck: Normal range of motion. Neck supple.  Cardiovascular: Normal rate.  Respiratory: Effort normal.  GI: Soft.  Musculoskeletal:     Right knee: She exhibits decreased range of motion, swelling, effusion, abnormal alignment and abnormal meniscus. Tenderness found. Medial joint line and lateral joint line tenderness noted.  Neurological: She is alert and oriented to person, place, and time.  Skin: Skin is warm and dry.  Psychiatric: She has a normal mood and affect.    Vital signs in last 24 hours:    Labs:   Estimated body mass index is 38.79 kg/m as calculated from the following:   Height as of 12/02/18: 5\' 3"  (1.6 m).   Weight as of 12/02/18: 99.3 kg.   Imaging Review Plain radiographs demonstrate severe degenerative joint disease of the right knee(s). The overall alignment ismild varus. The bone quality appears to be good for age and reported activity level.      Assessment/Plan:  End stage arthritis, right knee   The patient history, physical examination, clinical judgment of the provider and imaging studies are consistent with end stage degenerative joint disease of the right knee(s) and total knee arthroplasty is deemed medically necessary. The treatment options including medical management, injection therapy arthroscopy and arthroplasty were discussed at length. The risks and benefits of total knee  arthroplasty were presented and reviewed. The risks due to aseptic loosening, infection, stiffness, patella tracking problems, thromboembolic complications and other imponderables were discussed. The patient acknowledged the explanation, agreed to proceed with the plan and consent was signed. Patient is being admitted for inpatient treatment for surgery, pain control, PT, OT, prophylactic antibiotics, VTE prophylaxis, progressive ambulation and ADL's and discharge planning. The patient is planning to be  discharged home with home health services    Anticipated LOS equal to or greater than 2 midnights due to - Age 38 and older with one or more of the following:  - Obesity  - Expected need for hospital services (PT, OT, Nursing) required for safe  discharge  - Anticipated need for postoperative skilled nursing care or inpatient rehab  - Active co-morbidities: Coronary Artery Disease OR   - Unanticipated findings during/Post Surgery: None  - Patient is a high risk of re-admission due to: None

## 2018-12-10 NOTE — Transfer of Care (Signed)
Immediate Anesthesia Transfer of Care Note  Patient: Debra Wright  Procedure(s) Performed: RIGHT TOTAL KNEE ARTHROPLASTY (Right Knee)  Patient Location: PACU  Anesthesia Type:Spinal  Level of Consciousness: awake, alert  and oriented  Airway & Oxygen Therapy: Patient Spontanous Breathing and Patient connected to nasal cannula oxygen  Post-op Assessment: Report given to RN and Post -op Vital signs reviewed and stable  Post vital signs: Reviewed and stable  Last Vitals:  Vitals Value Taken Time  BP    Temp    Pulse    Resp    SpO2      Last Pain:  Vitals:   12/10/18 1420  TempSrc:   PainSc: 0-No pain      Patients Stated Pain Goal: 1 (10/40/45 9136)  Complications: No apparent anesthesia complications

## 2018-12-10 NOTE — Progress Notes (Signed)
Patient in extreme pain and states that pain medication given had not been helping. On call MD ordered a one time dose of Dilaudid. Given to patient. Will continue to monitor patients pain control.

## 2018-12-11 ENCOUNTER — Other Ambulatory Visit: Payer: Self-pay

## 2018-12-11 ENCOUNTER — Encounter (HOSPITAL_COMMUNITY): Payer: Self-pay | Admitting: Orthopaedic Surgery

## 2018-12-11 LAB — CBC
HEMATOCRIT: 32.8 % — AB (ref 36.0–46.0)
Hemoglobin: 10.2 g/dL — ABNORMAL LOW (ref 12.0–15.0)
MCH: 25.1 pg — ABNORMAL LOW (ref 26.0–34.0)
MCHC: 31.1 g/dL (ref 30.0–36.0)
MCV: 80.6 fL (ref 80.0–100.0)
Platelets: 313 10*3/uL (ref 150–400)
RBC: 4.07 MIL/uL (ref 3.87–5.11)
RDW: 15.5 % (ref 11.5–15.5)
WBC: 13.5 10*3/uL — ABNORMAL HIGH (ref 4.0–10.5)
nRBC: 0 % (ref 0.0–0.2)

## 2018-12-11 LAB — BASIC METABOLIC PANEL
Anion gap: 9 (ref 5–15)
CO2: 25 mmol/L (ref 22–32)
Calcium: 8.3 mg/dL — ABNORMAL LOW (ref 8.9–10.3)
Chloride: 101 mmol/L (ref 98–111)
Creatinine, Ser: 0.61 mg/dL (ref 0.44–1.00)
GFR calc Af Amer: >60 mL/min (ref >60)
GFR calc non Af Amer: >60 mL/min (ref >60)
Glucose, Bld: 223 mg/dL — ABNORMAL HIGH (ref 70–99)
Potassium: 3.7 mmol/L (ref 3.5–5.1)
Sodium: 135 mmol/L (ref 135–145)

## 2018-12-11 LAB — GLUCOSE, CAPILLARY
Glucose-Capillary: 204 mg/dL — ABNORMAL HIGH (ref 70–99)
Glucose-Capillary: 197 mg/dL — ABNORMAL HIGH (ref 70–99)
Glucose-Capillary: 175 mg/dL — ABNORMAL HIGH (ref 70–99)

## 2018-12-11 LAB — PROTIME-INR
INR: 1.2 (ref 0.8–1.2)
Prothrombin Time: 14.9 seconds (ref 11.4–15.2)

## 2018-12-11 MED ORDER — WARFARIN SODIUM 7.5 MG PO TABS
7.5000 mg | ORAL_TABLET | Freq: Once | ORAL | Status: AC
  Administered 2018-12-11: 7.5 mg via ORAL
  Filled 2018-12-11: qty 1

## 2018-12-11 MED ORDER — INFLUENZA VAC SPLIT HIGH-DOSE 0.5 ML IM SUSY
0.5000 mL | PREFILLED_SYRINGE | INTRAMUSCULAR | Status: DC
  Filled 2018-12-11: qty 0.5

## 2018-12-11 MED ORDER — INSULIN ASPART 100 UNIT/ML ~~LOC~~ SOLN: 0.0000 [IU] | Freq: Every day | SUBCUTANEOUS | Status: DC

## 2018-12-11 MED ORDER — INSULIN ASPART 100 UNIT/ML ~~LOC~~ SOLN
0.0000 [IU] | Freq: Three times a day (TID) | SUBCUTANEOUS | Status: DC
  Administered 2018-12-11: 3 [IU] via SUBCUTANEOUS
  Administered 2018-12-11: 5 [IU] via SUBCUTANEOUS
  Administered 2018-12-11: 3 [IU] via SUBCUTANEOUS
  Administered 2018-12-12 (×3): 2 [IU] via SUBCUTANEOUS
  Administered 2018-12-13: 5 [IU] via SUBCUTANEOUS
  Administered 2018-12-13: 2 [IU] via SUBCUTANEOUS

## 2018-12-11 NOTE — Progress Notes (Signed)
Subjective: 1 Day Post-Op Procedure(s) (LRB): RIGHT TOTAL KNEE ARTHROPLASTY (Right) Patient reports pain as moderate.  Slight acute blood loss anemia from surgery, but tolerating well.  Vitals stable.  Objective: Vital signs in last 24 hours: Temp:  [97.7 F (36.5 C)-98.7 F (37.1 C)] 98.4 F (36.9 C) (03/11 0528) Pulse Rate:  [50-98] 69 (03/11 0528) Resp:  [12-24] 18 (03/11 0528) BP: (117-162)/(50-97) 146/88 (03/11 0528) SpO2:  [92 %-100 %] 97 % (03/11 0528) Weight:  [99.3 kg] 99.3 kg (03/10 1227)  Intake/Output from previous day: 03/10 0701 - 03/11 0700 In: 2250.8 [P.O.:200; I.V.:1150.8; IV Piggyback:100] Out: 2700 [Urine:2700] Intake/Output this shift: No intake/output data recorded.  Recent Labs    12/11/18 0506  HGB 10.2*   Recent Labs    12/11/18 0506  WBC 13.5*  RBC 4.07  HCT 32.8*  PLT 313   Recent Labs    12/11/18 0506  NA 135  K 3.7  CL 101  CO2 25  BUN <5*  CREATININE 0.61  GLUCOSE 223*  CALCIUM 8.3*   Recent Labs    12/10/18 1306 12/11/18 0506  INR 1.1 1.2    Sensation intact distally Intact pulses distally Dorsiflexion/Plantar flexion intact Incision: dressing C/D/I Compartment soft   Assessment/Plan: 1 Day Post-Op Procedure(s) (LRB): RIGHT TOTAL KNEE ARTHROPLASTY (Right) Up with therapy      Mcarthur Rossetti 12/11/2018, 8:16 AM

## 2018-12-11 NOTE — Plan of Care (Signed)
  Problem: Education: Goal: Knowledge of General Education information will improve Description Including pain rating scale, medication(s)/side effects and non-pharmacologic comfort measures Outcome: Progressing   Problem: Pain Managment: Goal: General experience of comfort will improve Outcome: Progressing   Problem: Safety: Goal: Ability to remain free from injury will improve Outcome: Progressing   Problem: Skin Integrity: Goal: Risk for impaired skin integrity will decrease Outcome: Progressing   Problem: Elimination: Goal: Will not experience complications related to bowel motility Outcome: Progressing   Problem: Activity: Goal: Risk for activity intolerance will decrease Outcome: Progressing

## 2018-12-11 NOTE — Evaluation (Signed)
Occupational Therapy Evaluation Patient Details Name: Debra Wright MRN: [id number] DOB: 1950-11-25 Today's Date: 12/11/2018    History of Present Illness 68 y.o. female, has a history of pain and functional disability in the right knee due to arthritis and has failed non-surgical conservative treatments. s/p R TKA 12/10/2018. PMH: L TKA, HTN, Morbid obesity, Afib, OSA and R shoulder replacement.   Clinical Impression   Patient is s/p R TKA surgery resulting in functional limitations due to the deficits listed below (see OT problem list). Pt required MOD (A) for bed mobility and limited by pain.  Patient will benefit from skilled OT acutely to increase independence and safety with ADLS to allow discharge SNF but will refuse. Recommend HHOT services if declining SNF placement pending progress. Pt most limited by pain.     Follow Up Recommendations  SNF(pt will decline so HHOT)    Equipment Recommendations  None recommended by OT(reports having all DME needed)    Recommendations for Other Services       Precautions / Restrictions Precautions Precautions: Fall Precaution Comments: s/p R TKA Restrictions Weight Bearing Restrictions: Yes RLE Weight Bearing: Weight bearing as tolerated      Mobility Bed Mobility Overal bed mobility: Needs Assistance Bed Mobility: Sit to Supine     Supine to sit: Mod assist     General bed mobility comments: modA for management of LE across bed   Transfers Overall transfer level: Needs assistance Equipment used: Rolling walker (2 wheeled) Transfers: Sit to/from Stand Sit to Stand: Min assist         General transfer comment: elevated surface that allows for easier power up from chair minA for powerup and steadying,    Balance Overall balance assessment: Needs assistance Sitting-balance support: Feet supported;Single extremity supported;No upper extremity supported;Bilateral upper extremity supported Sitting balance-Leahy Scale:  Fair Sitting balance - Comments: requires UE support most of the time   Standing balance support: Bilateral upper extremity supported Standing balance-Leahy Scale: Poor Standing balance comment: requires RW for support                           ADL either performed or assessed with clinical judgement   ADL Overall ADL's : Needs assistance/impaired Eating/Feeding: Independent;Sitting   Grooming: Wash/dry hands;Wash/dry face;Supervision/safety;Sitting   Upper Body Bathing: Moderate assistance;Sitting   Lower Body Bathing: Maximal assistance;Sit to/from stand   Upper Body Dressing : Moderate assistance;Sitting   Lower Body Dressing: Maximal assistance;Sit to/from stand   Toilet Transfer: Moderate assistance;RW;BSC Toilet Transfer Details (indicate cue type and reason): increase time and reports extreme pain simulated chair to bed.  Toileting- Clothing Manipulation and Hygiene: Maximal assistance         General ADL Comments: Pt expressed having AE in the hosue if she "has to use it". Pt and spouse educated on the need to maximize independence and use of equipment coudl help with that processs. pt concerned with rehab options that are not open due to feeling the need to help spouse. when asked more directly the spouse does all adls and iadls independently and in fact insist on taking care of the patient.      Vision         Perception     Praxis      Pertinent Vitals/Pain Pain Assessment: 0-10 Pain Score: 9  Pain Location: R knee Pain Descriptors / Indicators: Operative site guarding;Guarding;Grimacing;Throbbing;Sharp;Shooting Pain Intervention(s): Monitored during session;Premedicated before session;Repositioned;Patient requesting pain meds-RN notified  Hand Dominance Right   Extremity/Trunk Assessment Upper Extremity Assessment Upper Extremity Assessment: Overall WFL for tasks assessed   Lower Extremity Assessment Lower Extremity Assessment: Defer  to PT evaluation RLE Deficits / Details: R TKA, decreased hip and knee ROM/strength secondary to surgical intervention, ankle WFL RLE: Unable to fully assess due to pain RLE Sensation: decreased light touch LLE Deficits / Details: hx LTKA, hip AROM WFL, knee lacks full ext/flex, ankle WFL, strength grossly 3+/5   Cervical / Trunk Assessment Cervical / Trunk Assessment: Kyphotic   Communication Communication Communication: No difficulties   Cognition Arousal/Alertness: Awake/alert Behavior During Therapy: WFL for tasks assessed/performed Overall Cognitive Status: Within Functional Limits for tasks assessed                                     General Comments  VSS and ace wrap in place    Exercises Exercises: General Lower Extremity General Exercises - Lower Extremity Heel Slides: AROM;Right;10 reps;Supine   Shoulder Instructions      Home Living Family/patient expects to be discharged to:: Private residence Living Arrangements: Spouse/significant other Available Help at Discharge: Family;Available 24 hours/day Type of Home: Apartment Home Access: Level entry     Home Layout: One level     Bathroom Shower/Tub: Teacher, early years/pre: Handicapped height Bathroom Accessibility: Yes   Home Equipment: Environmental consultant - 2 wheels;Bedside commode;Cane - single point;Shower seat;Grab bars - toilet;Grab bars - tub/shower;Wheelchair - manual;Hospital bed   Additional Comments: will have son and wife stay with them for 72 hours to provide care upon d/c home. Spouse insist that that he can provide care needed despite his back deficits, right hand deficits and balance deficits.       Prior Functioning/Environment Level of Independence: Independent with assistive device(s);Needs assistance    ADL's / Homemaking Assistance Needed: reports spouse would have to help on occassion with shoes. pt states doesnt wear shoes because they are too hard to get on and wears croc  shoes for easy don doff. pt reports reacher and sock aide in the home if needed   Comments: ambulated with cane, limited to household distances, order groceries online, independent with ADLs        OT Problem List: Decreased strength;Decreased activity tolerance;Impaired balance (sitting and/or standing);Decreased range of motion;Decreased safety awareness;Decreased knowledge of use of DME or AE;Decreased knowledge of precautions;Pain;Obesity      OT Treatment/Interventions: Self-care/ADL training;Therapeutic exercise;Neuromuscular education;Energy conservation;DME and/or AE instruction;Manual therapy;Therapeutic activities;Patient/family education;Balance training    OT Goals(Current goals can be found in the care plan section) Acute Rehab OT Goals Patient Stated Goal: to go home OT Goal Formulation: With patient/family Time For Goal Achievement: 12/25/18 Potential to Achieve Goals: Good  OT Frequency: Min 2X/week   Barriers to D/C: Other (comment)(needs to be mod I to dlc home safely)          Co-evaluation              AM-PAC OT "6 Clicks" Daily Activity     Outcome Measure Help from another person eating meals?: None Help from another person taking care of personal grooming?: A Little Help from another person toileting, which includes using toliet, bedpan, or urinal?: A Lot Help from another person bathing (including washing, rinsing, drying)?: A Lot Help from another person to put on and taking off regular upper body clothing?: A Little Help from another person to put on  and taking off regular lower body clothing?: A Lot 6 Click Score: 16   End of Session Equipment Utilized During Treatment: Gait belt;Rolling walker CPM Right Knee CPM Right Knee: Off Nurse Communication: Mobility status;Precautions  Activity Tolerance: Patient limited by pain Patient left: in bed;with call bell/phone within reach;with family/visitor present;with nursing/sitter in room  OT Visit  Diagnosis: Unsteadiness on feet (R26.81);Muscle weakness (generalized) (M62.81)                Time: 3539-1225 OT Time Calculation (min): 40 min Charges:  OT General Charges $OT Visit: 1 Visit OT Evaluation $OT Eval Moderate Complexity: 1 Mod OT Treatments $Self Care/Home Management : 8-22 mins   Jeri Modena, OTR/L  Acute Rehabilitation Services Pager: [phone] Office: [phone] .   Jeri Modena 12/11/2018, 11:18 AM

## 2018-12-11 NOTE — Progress Notes (Signed)
ANTICOAGULATION CONSULT NOTE - Follow Up Consult  Pharmacy Consult for Warfarin Indication: atrial fibrillation  Allergies  Allergen Reactions  . Antivert [Meclizine Hcl] Other (See Comments)    "makes me feel like my skin is falling off".    Patient Measurements: Height: 5\' 3"  (160 cm) Weight: 219 lb (99.3 kg) IBW/kg (Calculated) : 52.4  Vital Signs: Temp: 97.7 F (36.5 C) (03/11 0820) Temp Source: Axillary (03/11 0820) BP: 160/61 (03/11 0820) Pulse Rate: 75 (03/11 0820)  Labs: Recent Labs    12/10/18 1306 12/11/18 0506  HGB  --  10.2*  HCT  --  32.8*  PLT  --  313  LABPROT 13.7 14.9  INR 1.1 1.2  CREATININE  --  0.61    Estimated Creatinine Clearance: 73.5 mL/min (by C-G formula based on SCr of 0.61 mg/dL).   Assessment: 68 year old female on warfarin prior to admission for atrial fibrillation which was held for elective TKA. Last dose of warfarin was on 12/03/18. INR on admission was 1.1. Patient had R-TKA on 3/10 and warfarin was resumed per pharmacy consult.  INR today is 1.2 - unchanged, dose ordered last night dose not appear to have been given.  Slight acute blood loss anemia noted with Hgb at 10.2 today. No bleeding noted. Platelets are stable.   PTA warfarin dose: 5mg  on Monday, Wednesday, and Friday and 7.5mg  all other days.  Goal of Therapy:  INR 2-3 Monitor platelets by anticoagulation protocol: Yes   Plan:  Warfarin 7.5mg  po x1 today per home regimen.  Daily PT/INR.   Sloan Leiter, PharmD, BCPS, BCCCP Clinical Pharmacist Please refer to [facility] for [place] numbers 12/11/2018,11:10 AM

## 2018-12-11 NOTE — Progress Notes (Signed)
PT Progress Note  Pt is progressing towards goals however continues to have severely limited mobility, which requires minA for assist and safety. PT continues to recommend SNF level rehab, however pt and husband refuse. Pt only able to ambulate 12 feet and it took 20 minutes to accomplish. PT will continue to work with pt to progress safe mobility for d/c home.     12/11/18 1800  PT Visit Information  Last PT Received On 12/11/18  Assistance Needed +2 (for mobility)  History of Present Illness 68 y.o. female, has a history of pain and functional disability in the right knee due to arthritis and has failed non-surgical conservative treatments. s/p R TKA 12/10/2018. PMH: L TKA, HTN, Morbid obesity, Afib, OSA and R shoulder replacement.  Subjective Data  Patient Stated Goal have less pain  Precautions  Precautions Fall  Precaution Comments s/p R TKA  Restrictions  Weight Bearing Restrictions Yes  RLE Weight Bearing WBAT  Pain Assessment  Pain Assessment 0-10  Pain Score 9  Pain Location R knee  Pain Descriptors / Indicators Operative site guarding;Guarding;Grimacing;Throbbing;Sharp;Shooting  Pain Intervention(s) Limited activity within patient's tolerance;Premedicated before session;Monitored during session  Cognition  Arousal/Alertness Awake/alert  Behavior During Therapy WFL for tasks assessed/performed  Overall Cognitive Status Within Functional Limits for tasks assessed  Bed Mobility  Overal bed mobility Needs Assistance  Bed Mobility Supine to Sit  Supine to sit Min assist  General bed mobility comments mina for management for LE to EoB, pt able to pull herself to EoB with heavy use of bedrails and increased time and effort  Transfers  Overall transfer level Needs assistance  Equipment used Rolling walker (2 wheeled)  Transfers Sit to/from Stand  Sit to Stand Min assist  General transfer comment minA for powerup and steadying, vc for posterior pelvic rotation, UE support and  moving R LE under body  Ambulation/Gait  Ambulation/Gait assistance Min assist  Gait Distance (Feet) 12 Feet  Assistive device Rolling walker (2 wheeled)  Gait Pattern/deviations Step-to pattern;Decreased step length - left;Decreased stance time - right;Decreased weight shift to right;Shuffle;Antalgic;Trunk flexed  General Gait Details mina for steadying, pt requires substantially increased time and effort to ambulate 12 feet, maximal vc for sequencing, use of UE to offweight L LE, upright posture to improve L foot swing through  Gait velocity slowed  Gait velocity interpretation <1.31 ft/sec, indicative of household ambulator  Balance  Overall balance assessment Needs assistance  Sitting-balance support Feet supported;Single extremity supported;No upper extremity supported;Bilateral upper extremity supported  Sitting balance-Leahy Scale Fair  Sitting balance - Comments requires UE support most of the time  Standing balance support Bilateral upper extremity supported  Standing balance-Leahy Scale Poor  Standing balance comment requires RW for support  General Comments  General comments (skin integrity, edema, etc.) Pt and husband adamant about discharge home despite increased effort needed for mobility and husbands limited ability to assist.   PT - End of Session  Equipment Utilized During Treatment Gait belt  Activity Tolerance Patient limited by pain  Patient left in chair;with call bell/phone within reach;with chair alarm set  Nurse Communication Mobility status;Precautions;Weight bearing status  CPM Right Knee  CPM Right Knee Off   PT - Assessment/Plan  PT Plan Current plan remains appropriate  PT Visit Diagnosis Unsteadiness on feet (R26.81);Other abnormalities of gait and mobility (R26.89);Muscle weakness (generalized) (M62.81);Difficulty in walking, not elsewhere classified (R26.2);Pain  Pain - Right/Left Right  Pain - part of body Knee  PT Frequency (ACUTE ONLY) 7X/week  Follow Up Recommendations Follow surgeon's recommendation for DC plan and follow-up therapies  PT equipment None recommended by PT  AM-PAC PT "6 Clicks" Mobility Outcome Measure (Version 2)  Help needed turning from your back to your side while in a flat bed without using bedrails? 2  Help needed moving from lying on your back to sitting on the side of a flat bed without using bedrails? 2  Help needed moving to and from a bed to a chair (including a wheelchair)? 2  Help needed standing up from a chair using your arms (e.g., wheelchair or bedside chair)? 2  Help needed to walk in hospital room? 1  Help needed climbing 3-5 steps with a railing?  1  6 Click Score 10  Consider Recommendation of Discharge To: CIR/SNF/LTACH  Acute Rehab PT Goals  PT Goal Formulation With patient/family  Time For Goal Achievement 12/25/18  Potential to Achieve Goals Fair  PT Time Calculation  PT Start Time (ACUTE ONLY) 1616  PT Stop Time (ACUTE ONLY) 1648  PT Time Calculation (min) (ACUTE ONLY) 32 min  PT General Charges  $$ ACUTE PT VISIT 1 Visit  PT Treatments  $Gait Training 23-37 mins   Meloni Hinz B. Migdalia Dk PT, DPT Acute Rehabilitation Services Pager [phone] Office [phone]

## 2018-12-11 NOTE — Evaluation (Signed)
Physical Therapy Evaluation Patient Details Name: Debra Wright MRN: [id number] DOB: 11/23/1950 Today's Date: 12/11/2018   History of Present Illness  68 y.o. female, has a history of pain and functional disability in the right knee due to arthritis and has failed non-surgical conservative treatments. s/p R TKA 12/10/2018. PMH: L TKA, HTN, Morbid obesity, Afib, OSA and R shoulder replacement.  Clinical Impression  Pt is s/p TKA resulting in the deficits listed below (see PT Problem List). Pt is limited in safe mobility by increased knee pain, decreased strength and ROM, decreased endurance and able bodied assistance once she is home. Pt is mod A for bed mobility, transfers and ambulation of 3 feet with RW. Surgeon recommendation for [facility] services, however PT recommending SNF level assist give pt's reduced mobility at this time. Pt will benefit from skilled PT to increase their independence and safety with mobility to allow discharge to the venue listed below.      Follow Up Recommendations Follow surgeon's recommendation for DC plan and follow-up therapies    Equipment Recommendations  None recommended by PT       Precautions / Restrictions Precautions Precautions: Fall Precaution Comments: s/p R TKA Restrictions Weight Bearing Restrictions: Yes RLE Weight Bearing: Weight bearing as tolerated      Mobility  Bed Mobility Overal bed mobility: Needs Assistance Bed Mobility: Supine to Sit     Supine to sit: Mod assist     General bed mobility comments: modA for management of LE across bed to floor, pad scoot of hips to EoB and trunk to upright  Transfers Overall transfer level: Needs assistance Equipment used: Rolling walker (2 wheeled) Transfers: Sit to/from Stand Sit to Stand: Min assist         General transfer comment: minA for powerup and steadying, vc for posterior pelvic rotation, UE support and moving R LE under body  Ambulation/Gait Ambulation/Gait assistance: Mod  assist Gait Distance (Feet): 3 Feet Assistive device: Rolling walker (2 wheeled) Gait Pattern/deviations: Step-to pattern;Decreased step length - left;Decreased stance time - right;Decreased weight shift to right;Shuffle;Antalgic;Trunk flexed Gait velocity: slowed Gait velocity interpretation: <1.31 ft/sec, indicative of household ambulator General Gait Details: modA for lateral stepping to recliner from bed, vc for upright posture, open eyes, increased UE support, lifting L foot not wiggling it across the floor        Balance Overall balance assessment: Needs assistance Sitting-balance support: Feet supported;Single extremity supported;No upper extremity supported;Bilateral upper extremity supported Sitting balance-Leahy Scale: Fair Sitting balance - Comments: requires UE support most of the time   Standing balance support: Bilateral upper extremity supported Standing balance-Leahy Scale: Poor Standing balance comment: requires RW for support                             Pertinent Vitals/Pain Pain Assessment: 0-10 Pain Score: 9  Pain Location: R knee Pain Descriptors / Indicators: Operative site guarding;Guarding;Grimacing;Throbbing;Sharp;Shooting Pain Intervention(s): Limited activity within patient's tolerance;Monitored during session;Repositioned    Home Living Family/patient expects to be discharged to:: Private residence Living Arrangements: Spouse/significant other Available Help at Discharge: Family;Available 24 hours/day Type of Home: Apartment Home Access: Level entry     Home Layout: One level Home Equipment: Walker - 2 wheels;Bedside commode;Cane - single point;Shower seat;Grab bars - toilet;Grab bars - tub/shower;Wheelchair - manual;Hospital bed      Prior Function Level of Independence: Independent with assistive device(s)         Comments: ambulated with  cane, limited to household distances, order groceries online, independent with ADLs         Extremity/Trunk Assessment   Upper Extremity Assessment Upper Extremity Assessment: Defer to OT evaluation    Lower Extremity Assessment Lower Extremity Assessment: RLE deficits/detail;LLE deficits/detail RLE Deficits / Details: R TKA, decreased hip and knee ROM/strength secondary to surgical intervention, ankle WFL RLE: Unable to fully assess due to pain RLE Sensation: decreased light touch LLE Deficits / Details: hx LTKA, hip AROM WFL, knee lacks full ext/flex, ankle WFL, strength grossly 3+/5       Communication   Communication: No difficulties  Cognition Arousal/Alertness: Awake/alert Behavior During Therapy: WFL for tasks assessed/performed Overall Cognitive Status: Within Functional Limits for tasks assessed                                        General Comments General comments (skin integrity, edema, etc.): ACE wrap in place over incision, Clean, dry and intact, VSS, Husband present at end of session.    Exercises General Exercises - Lower Extremity Heel Slides: AROM;Right;10 reps;Supine   Assessment/Plan    PT Assessment Patient needs continued PT services  PT Problem List Decreased strength;Decreased range of motion;Decreased activity tolerance;Decreased balance;Decreased mobility;Decreased coordination;Pain       PT Treatment Interventions DME instruction;Gait training;Functional mobility training;Therapeutic activities;Therapeutic exercise;Balance training;Cognitive remediation;Patient/family education    PT Goals (Current goals can be found in the Care Plan section)  Acute Rehab PT Goals Patient Stated Goal: have less pain PT Goal Formulation: With patient/family Time For Goal Achievement: 12/25/18 Potential to Achieve Goals: Fair    Frequency 7X/week    AM-PAC PT "6 Clicks" Mobility  Outcome Measure Help needed turning from your back to your side while in a flat bed without using bedrails?: A Lot Help needed moving from  lying on your back to sitting on the side of a flat bed without using bedrails?: A Lot Help needed moving to and from a bed to a chair (including a wheelchair)?: A Lot Help needed standing up from a chair using your arms (e.g., wheelchair or bedside chair)?: A Lot Help needed to walk in hospital room?: Total Help needed climbing 3-5 steps with a railing? : Total 6 Click Score: 10    End of Session Equipment Utilized During Treatment: Gait belt Activity Tolerance: Patient limited by pain Patient left: in chair;with call bell/phone within reach;with chair alarm set Nurse Communication: Mobility status;Precautions;Weight bearing status PT Visit Diagnosis: Unsteadiness on feet (R26.81);Other abnormalities of gait and mobility (R26.89);Muscle weakness (generalized) (M62.81);Difficulty in walking, not elsewhere classified (R26.2);Pain Pain - Right/Left: Right Pain - part of body: Knee    Time: 9485-4627 PT Time Calculation (min) (ACUTE ONLY): 33 min   Charges:   PT Evaluation $PT Eval Low Complexity: 1 Low PT Treatments $Gait Training: 8-22 mins        Randy Castrejon B. Migdalia Dk PT, DPT Acute Rehabilitation Services Pager [phone] Office [phone]   [place] 12/11/2018, 10:18 AM

## 2018-12-11 NOTE — Care Plan (Signed)
Ortho Bundle Case Management Note  Patient Details  Name: Debra Wright MRN: [id number] Date of Birth: 1950-12-29   Spoke with patient for pre-op call. She states her husband will help her after surgery at discharge home. She has a front wheeled walker and a bedside commode already. Surgery remains scheduled for a Right total knee replacement on 12/10/18.    Patient remains in hospital. Limited mobility currently due to pain. Will continue to monitor. Discharge plan remains the same currently.                  DME Arranged:    DME Agency:     HH Arranged:  PT HH Agency:  [place] ([place])  Additional Comments: Please contact me with any questions of if this plan should need to change.  Jamse Arn, RN, BSN, [employer]  [phone] 12/11/2018, 11:32 AM

## 2018-12-11 NOTE — Progress Notes (Addendum)
1736: Pt not voided since foley removal this am. Bladder scan 182mL. Ninfa Linden, MD aware. Will continue to monitor.  1742: Aquacel dressing applied to RLE by Ninfa Linden, MD at bedside.

## 2018-12-11 NOTE — Care Management Note (Signed)
Case Management Note  Patient Details  Name: Debra Wright MRN: [id number] Date of Birth: 1951-09-08  Subjective/Objective:     68 yr old female s/p right total knee arthroplasty.               Action/Plan: Patient was preoperatively setup by office case manager, Jamse Arn, RN BSN.    Expected Discharge Date:   pending               Expected Discharge Plan:  [place]  In-House Referral:  NA  Discharge planning Services  CM Consult  Post Acute Care Choice:  Home Health(Has DME) Choice offered to:  Patient, Spouse  DME Arranged:  N/A DME Agency:  NA  HH Arranged:  PT HH Agency:  [place] ([facility])  Status of Service:  Completed, signed off  If discussed at [place] of Stay Meetings, dates discussed:    Additional Comments:  Ninfa Meeker, RN 12/11/2018, 11:52 AM

## 2018-12-11 NOTE — Plan of Care (Signed)
Problem: Education: Goal: Knowledge of General Education information will improve Description Including pain rating scale, medication(s)/side effects and non-pharmacologic comfort measures Outcome: Progressing   Problem: Health Behavior/Discharge Planning: Goal: Ability to manage health-related needs will improve Outcome: Progressing   Problem: Clinical Measurements: Goal: Respiratory complications will improve Outcome: Progressing Goal: Cardiovascular complication will be avoided Outcome: Progressing   Problem: Nutrition: Goal: Adequate nutrition will be maintained Outcome: Progressing   Problem: Coping: Goal: Level of anxiety will decrease Outcome: Progressing   Problem: Pain Managment: Goal: General experience of comfort will improve Outcome: Progressing   Problem: Safety: Goal: Ability to remain free from injury will improve Outcome: Progressing   Problem: Skin Integrity: Goal: Risk for impaired skin integrity will decrease Outcome: Progressing   

## 2018-12-11 NOTE — TOC Initial Note (Signed)
Transition of Care [facility]) - Initial/Assessment Note    Patient Details  Name: Debra Wright MRN: [id number] Date of Birth: 08/26/1951  [facility] CM/SW Contact:    Ninfa Meeker, RN Phone Number: 12/11/2018, 11:58 AM  Clinical Narrative:                   Expected Discharge Plan: [place] Barriers to Discharge: No Barriers Identified   Patient Goals and CMS Choice Patient states their goals for this hospitalization and ongoing recovery are:: get better CMS Medicare.gov Compare Post Acute Care list provided to:: Other (Comment Required)(completed at MD preop appointment) Choice offered to / list presented to : Patient  Expected Discharge Plan and Services Expected Discharge Plan: [place] Discharge Planning Services: CM Consult Post Acute Care Choice: Home Health(Has DME)                   DME Arranged: N/A DME Agency: NA HH Arranged: PT HH Agency: [place] ([facility])  Prior Living Arrangements/Services                       Activities of Daily Living Home Assistive Devices/Equipment: Radio producer (specify quad or straight), Eyeglasses, Environmental consultant (specify type), Shower chair with back, CPAP, Bedside commode/3-in-1 ADL Screening (condition at time of admission) Patient's cognitive ability adequate to safely complete daily activities?: Yes Is the patient deaf or have difficulty hearing?: No Does the patient have difficulty seeing, even when wearing glasses/contacts?: No Does the patient have difficulty concentrating, remembering, or making decisions?: No Patient able to express need for assistance with ADLs?: Yes Does the patient have difficulty dressing or bathing?: No Independently performs ADLs?: Yes (appropriate for developmental age) Does the patient have difficulty walking or climbing stairs?: Yes Weakness of Legs: None Weakness of Arms/Hands: None  Permission Sought/Granted                   Emotional Assessment              Admission diagnosis:  right knee osteoarthritis Patient Active Problem List   Diagnosis Date Noted  . Unilateral primary osteoarthritis, right knee 12/10/2018  . Status post total right knee replacement 12/10/2018  . Long term (current) use of anticoagulants 08/16/2018  . Hypertensive cardiovascular disease 06/04/2018  . Morbid obesity with BMI of 40.0-44.9, adult ([place]) 06/04/2018  . Fatigue 06/04/2018  . Chronic anticoagulation   . Atrial fibrillation and flutter ([place]) 05/02/2018  . Atrial fibrillation with RVR ([place]) 05/02/2018  . PAF (paroxysmal atrial fibrillation) ([place]) 05/02/2018  . Primary osteoarthritis of right knee 06/14/2017  . OSA (obstructive sleep apnea) 02/13/2017  . NIDDM-type 2 10/31/2016  . Essential hypertension 08/02/2016  . Anxiety and depression 08/02/2016  . Insomnia 08/02/2016  . Arthritis of left knee 10/13/2014  . Status post total left knee replacement 10/13/2014   PCP:  Eunice Blase, MD Pharmacy:   [facility] [address] [place], [place] - [address] [place] [address] Phone: [phone] Fax: [phone]  [id number] - [place], [place] [address] Phone: [phone] Fax: [phone]     Social Determinants of Health ([place]) Interventions    Readmission Risk Interventions 30 Day Unplanned Readmission Risk Score     Admission (Current) from 12/10/2018 in [place]  30 Day Unplanned Readmission Risk Score (%)  78 Filed at 12/11/2018 0801     This score is the patient's risk of an unplanned readmission within 30 days of being discharged (0 -100%). The score is based on dignosis, age, lab data, medications, orders, and past utilization.   Low:  0-14.9   Medium: 15-21.9   High: 22-29.9   Extreme: 30 and above       No flowsheet data found.

## 2018-12-12 LAB — CBC
HCT: 28.2 % — ABNORMAL LOW (ref 36.0–46.0)
Hemoglobin: 8.8 g/dL — ABNORMAL LOW (ref 12.0–15.0)
MCH: 25.6 pg — ABNORMAL LOW (ref 26.0–34.0)
MCHC: 31.2 g/dL (ref 30.0–36.0)
MCV: 82 fL (ref 80.0–100.0)
Platelets: 251 10*3/uL (ref 150–400)
RBC: 3.44 MIL/uL — ABNORMAL LOW (ref 3.87–5.11)
RDW: 15.9 % — ABNORMAL HIGH (ref 11.5–15.5)
WBC: 11.7 10*3/uL — ABNORMAL HIGH (ref 4.0–10.5)
nRBC: 0 % (ref 0.0–0.2)

## 2018-12-12 LAB — GLUCOSE, CAPILLARY
Glucose-Capillary: 127 mg/dL — ABNORMAL HIGH (ref 70–99)
Glucose-Capillary: 135 mg/dL — ABNORMAL HIGH (ref 70–99)
Glucose-Capillary: 122 mg/dL — ABNORMAL HIGH (ref 70–99)
Glucose-Capillary: 104 mg/dL — ABNORMAL HIGH (ref 70–99)

## 2018-12-12 LAB — PROTIME-INR
INR: 1.2 (ref 0.8–1.2)
Prothrombin Time: 15.3 seconds — ABNORMAL HIGH (ref 11.4–15.2)

## 2018-12-12 MED ORDER — WARFARIN SODIUM 7.5 MG PO TABS
7.5000 mg | ORAL_TABLET | Freq: Once | ORAL | Status: AC
  Administered 2018-12-12: 7.5 mg via ORAL
  Filled 2018-12-12: qty 1

## 2018-12-12 NOTE — Progress Notes (Signed)
Subjective: 2 Days Post-Op Procedure(s) (LRB): RIGHT TOTAL KNEE ARTHROPLASTY (Right) Patient reports pain as moderate.   Improved mobility today.  Objective: Vital signs in last 24 hours: Temp:  [97.8 F (36.6 C)-99 F (37.2 C)] 98.4 F (36.9 C) (03/12 0759) Pulse Rate:  [70-81] 77 (03/12 0759) Resp:  [15-20] 20 (03/12 0759) BP: (119-173)/(52-62) 119/60 (03/12 0759) SpO2:  [88 %-100 %] 100 % (03/12 0759)  Intake/Output from previous day: 03/11 0701 - 03/12 0700 In: 1257.3 [P.O.:360; I.V.:897.3] Out: 500 [Urine:500] Intake/Output this shift: Total I/O In: 240 [P.O.:240] Out: 900 [Urine:900]  Recent Labs    12/11/18 0506 12/12/18 0306  HGB 10.2* 8.8*   Recent Labs    12/11/18 0506 12/12/18 0306  WBC 13.5* 11.7*  RBC 4.07 3.44*  HCT 32.8* 28.2*  PLT 313 251   Recent Labs    12/11/18 0506  NA 135  K 3.7  CL 101  CO2 25  BUN <5*  CREATININE 0.61  GLUCOSE 223*  CALCIUM 8.3*   Recent Labs    12/11/18 0506 12/12/18 0306  INR 1.2 1.2    Sensation intact distally Intact pulses distally Dorsiflexion/Plantar flexion intact Incision: dressing C/D/I No cellulitis present Compartment soft   Assessment/Plan: 2 Days Post-Op Procedure(s) (LRB): RIGHT TOTAL KNEE ARTHROPLASTY (Right) Up with therapy Plan for discharge tomorrow Discharge home with home health    Mcarthur Rossetti 12/12/2018, 12:37 PM

## 2018-12-12 NOTE — Progress Notes (Signed)
ANTICOAGULATION CONSULT NOTE - Follow Up Consult  Pharmacy Consult for Warfarin Indication: atrial fibrillation  Allergies  Allergen Reactions  . Antivert [Meclizine Hcl] Other (See Comments)    "makes me feel like my skin is falling off".    Patient Measurements: Height: 5\' 3"  (160 cm) Weight: 219 lb (99.3 kg) IBW/kg (Calculated) : 52.4  Vital Signs: Temp: 98.4 F (36.9 C) (03/12 0759) Temp Source: Oral (03/12 0759) BP: 119/60 (03/12 0759) Pulse Rate: 77 (03/12 0759)  Labs: Recent Labs    12/10/18 1306 12/11/18 0506 12/12/18 0306  HGB  --  10.2* 8.8*  HCT  --  32.8* 28.2*  PLT  --  313 251  LABPROT 13.7 14.9 15.3*  INR 1.1 1.2 1.2  CREATININE  --  0.61  --     Estimated Creatinine Clearance: 73.5 mL/min (by C-G formula based on SCr of 0.61 mg/dL).   Assessment: 68 year old female on warfarin prior to admission for atrial fibrillation which was held for elective TKA. Last dose of warfarin was on 12/03/18. INR on admission was 1.1. Patient had R-TKA on 3/10 and warfarin was resumed per pharmacy consult.  INR today is 1.2 - unchanged, dose ordered last night dose not appear to have been given.  Slight acute blood loss anemia noted with Hgb at 10.2 today. No bleeding noted. Platelets are stable.   PTA warfarin dose: 5mg  on Monday, Wednesday, and Friday and 7.5mg  all other days.  Goal of Therapy:  INR 2-3 Monitor platelets by anticoagulation protocol: Yes   Plan:  Warfarin 7.5mg  po x1..  Daily PT/INR.   Sloan Leiter, PharmD, BCPS, BCCCP Clinical Pharmacist Please refer to [facility] for [place] numbers 12/12/2018,12:36 PM

## 2018-12-12 NOTE — Progress Notes (Signed)
Physical Therapy Treatment Patient Details Name: Debra Wright MRN: [id number] DOB: 1951/06/08 Today's Date: 12/12/2018    History of Present Illness 68 y.o. female, has a history of pain and functional disability in the right knee due to arthritis and has failed non-surgical conservative treatments. s/p R TKA 12/10/2018. PMH: L TKA, HTN, Morbid obesity, Afib, OSA and R shoulder replacement.    PT Comments    Patient seen for mobility progression. Pt is making progress toward PT goals and tolerated gait training distance of 25 ft this session. Continue to progress as tolerated.    Follow Up Recommendations  Follow surgeon's recommendation for DC plan and follow-up therapies     Equipment Recommendations  None recommended by PT    Recommendations for Other Services       Precautions / Restrictions Precautions Precautions: Fall Precaution Comments: reviewed precautions/positioning with pt Restrictions Weight Bearing Restrictions: Yes RLE Weight Bearing: Weight bearing as tolerated    Mobility  Bed Mobility Overal bed mobility: Needs Assistance Bed Mobility: Supine to Sit     Supine to sit: Min assist     General bed mobility comments: assist to bring R LE to EOB   Transfers Overall transfer level: Needs assistance Equipment used: Rolling walker (2 wheeled) Transfers: Sit to/from Stand Sit to Stand: Min assist         General transfer comment: assist to power up into standing and cues for safe hand placement  Ambulation/Gait Ambulation/Gait assistance: Min assist;Min guard Gait Distance (Feet): 25 Feet Assistive device: Rolling walker (2 wheeled) Gait Pattern/deviations: Step-to pattern;Decreased step length - left;Decreased stance time - right;Decreased weight shift to right;Antalgic Gait velocity: decreased   General Gait Details: multimodal cues for upright posture and vc for sequencing, proximity to RW for improved use of bilat UE to unweight painful LE,  and increased L step length    Stairs             Wheelchair Mobility    Modified Rankin (Stroke Patients Only)       Balance Overall balance assessment: Needs assistance Sitting-balance support: Feet supported;Single extremity supported;No upper extremity supported Sitting balance-Leahy Scale: Good     Standing balance support: Bilateral upper extremity supported Standing balance-Leahy Scale: Poor Standing balance comment: requires RW for support                            Cognition Arousal/Alertness: Awake/alert Behavior During Therapy: WFL for tasks assessed/performed Overall Cognitive Status: Within Functional Limits for tasks assessed                                        Exercises      General Comments General comments (skin integrity, edema, etc.): husband present during session      Pertinent Vitals/Pain Pain Assessment: Faces Faces Pain Scale: Hurts little more Pain Location: R knee Pain Descriptors / Indicators: Guarding;Grimacing;Sore;Burning Pain Intervention(s): Limited activity within patient's tolerance;Monitored during session;Premedicated before session;Repositioned    Home Living                      Prior Function            PT Goals (current goals can now be found in the care plan section) Acute Rehab PT Goals Patient Stated Goal: have less pain Progress towards PT goals: Progressing toward goals  Frequency    7X/week      PT Plan Current plan remains appropriate    Co-evaluation              AM-PAC PT "6 Clicks" Mobility   Outcome Measure  Help needed turning from your back to your side while in a flat bed without using bedrails?: A Little Help needed moving from lying on your back to sitting on the side of a flat bed without using bedrails?: A Little Help needed moving to and from a bed to a chair (including a wheelchair)?: A Little Help needed standing up from a chair  using your arms (e.g., wheelchair or bedside chair)?: A Little Help needed to walk in hospital room?: A Little Help needed climbing 3-5 steps with a railing? : A Lot 6 Click Score: 17    End of Session Equipment Utilized During Treatment: Gait belt Activity Tolerance: Patient tolerated treatment well Patient left: in chair;with call bell/phone within reach;with family/visitor present Nurse Communication: Mobility status PT Visit Diagnosis: Unsteadiness on feet (R26.81);Other abnormalities of gait and mobility (R26.89);Muscle weakness (generalized) (M62.81);Difficulty in walking, not elsewhere classified (R26.2);Pain Pain - Right/Left: Right Pain - part of body: Knee     Time: 9450-3888 PT Time Calculation (min) (ACUTE ONLY): 31 min  Charges:  $Gait Training: 23-37 mins                     Earney Navy, PTA Acute Rehabilitation Services Pager: [phone] Office: [phone]     Darliss Cheney 12/12/2018, 10:42 AM

## 2018-12-12 NOTE — Progress Notes (Signed)
Physical Therapy Treatment Patient Details Name: Debra Wright MRN: [id number] DOB: 03/21/1951 Today's Date: 12/12/2018    History of Present Illness 67 y.o. female, has a history of pain and functional disability in the right knee due to arthritis and has failed non-surgical conservative treatments. s/p R TKA 12/10/2018. PMH: L TKA, HTN, Morbid obesity, Afib, OSA and R shoulder replacement.    PT Comments    Patient seen for LE strengthening/ROM exercises for HEP. Pt requires assistance with therex due to pain and poor quad control at this time but feel pt is putting forth good effort. Pt encouraged to continue working therex on her own throughout the day. Continue to progress as tolerated.    Follow Up Recommendations  Follow surgeon's recommendation for DC plan and follow-up therapies     Equipment Recommendations  None recommended by PT    Recommendations for Other Services       Precautions / Restrictions Precautions Precautions: Fall Precaution Comments: reviewed precautions/positioning with pt Restrictions Weight Bearing Restrictions: Yes RLE Weight Bearing: Weight bearing as tolerated    Mobility  Bed Mobility               General bed mobility comments: pt OOB in chair upon arrival  Transfers                    Ambulation/Gait                 Stairs             Wheelchair Mobility    Modified Rankin (Stroke Patients Only)       Balance Overall balance assessment: Needs assistance Sitting-balance support: Feet supported;Single extremity supported;No upper extremity supported Sitting balance-Leahy Scale: Good     Standing balance support: Bilateral upper extremity supported Standing balance-Leahy Scale: Poor Standing balance comment: requires RW for support                            Cognition Arousal/Alertness: Awake/alert Behavior During Therapy: WFL for tasks assessed/performed Overall Cognitive Status:  Within Functional Limits for tasks assessed                                        Exercises Total Joint Exercises Ankle Circles/Pumps: AROM;Both Quad Sets: AROM;Both Short Arc Quad: AAROM;Right Heel Slides: AAROM;Right Hip ABduction/ADduction: AAROM;Right Straight Leg Raises: AAROM;Right    General Comments        Pertinent Vitals/Pain Pain Assessment: Faces Faces Pain Scale: Hurts even more Pain Location: R knee Pain Descriptors / Indicators: Guarding;Grimacing;Sore;Burning Pain Intervention(s): Limited activity within patient's tolerance;Monitored during session;Premedicated before session;Repositioned;Ice applied    Home Living                      Prior Function            PT Goals (current goals can now be found in the care plan section) Acute Rehab PT Goals Patient Stated Goal: have less pain Progress towards PT goals: Progressing toward goals    Frequency    7X/week      PT Plan Current plan remains appropriate    Co-evaluation              AM-PAC PT "6 Clicks" Mobility   Outcome Measure  Help needed turning from your back to your side  while in a flat bed without using bedrails?: A Little Help needed moving from lying on your back to sitting on the side of a flat bed without using bedrails?: A Little Help needed moving to and from a bed to a chair (including a wheelchair)?: A Little Help needed standing up from a chair using your arms (e.g., wheelchair or bedside chair)?: A Little Help needed to walk in hospital room?: A Little Help needed climbing 3-5 steps with a railing? : A Lot 6 Click Score: 17    End of Session   Activity Tolerance: Patient limited by pain Patient left: in chair;with call bell/phone within reach Nurse Communication: Mobility status PT Visit Diagnosis: Unsteadiness on feet (R26.81);Other abnormalities of gait and mobility (R26.89);Muscle weakness (generalized) (M62.81);Difficulty in walking,  not elsewhere classified (R26.2);Pain Pain - Right/Left: Right Pain - part of body: Knee     Time: 9357-0177 PT Time Calculation (min) (ACUTE ONLY): 29 min  Charges:  $Therapeutic Exercise: 23-37 mins                     Earney Navy, PTA Acute Rehabilitation Services Pager: [phone] Office: [phone]     Darliss Cheney 12/12/2018, 2:42 PM

## 2018-12-13 LAB — PROTIME-INR
INR: 1.6 — ABNORMAL HIGH (ref 0.8–1.2)
PROTHROMBIN TIME: 18.6 s — AB (ref 11.4–15.2)

## 2018-12-13 LAB — GLUCOSE, CAPILLARY
Glucose-Capillary: 143 mg/dL — ABNORMAL HIGH (ref 70–99)
Glucose-Capillary: 211 mg/dL — ABNORMAL HIGH (ref 70–99)

## 2018-12-13 MED ORDER — METHOCARBAMOL 500 MG PO TABS: 500.0000 mg | ORAL_TABLET | Freq: Four times a day (QID) | ORAL | 0 refills | Status: DC | PRN

## 2018-12-13 MED ORDER — OXYCODONE HCL 5 MG PO TABS: 5.0000 mg | ORAL_TABLET | ORAL | 0 refills | Status: DC | PRN

## 2018-12-13 NOTE — Progress Notes (Signed)
Occupational Therapy Treatment Patient Details Name: Debra Wright MRN: [id number] DOB: 03/28/1951 Today's Date: 12/13/2018    History of present illness 68 y.o. female, has a history of pain and functional disability in the right knee due to arthritis and has failed non-surgical conservative treatments. s/p R TKA 12/10/2018. PMH: L TKA, HTN, Morbid obesity, Afib, OSA and R shoulder replacement.   OT comments  Pt progressing toward OT goals. Pt educated on tub transfer with handout provided and use of AE (reacher/sock aide) for LB dressing. Pt unable to demonstrate tub transfer due to increased pain to transfer weight to R side for sequence. Pt stated sequence to therapist to ensure knowledge of proper sequence. Pt demonstrated Mod A LB dressing with use of AE while seated in chair. Pt will benefit from continued therapy with HHOT to address further functional task in home setting. DC recommendation remains appropriate. OT will continue to follow acutely.   Follow Up Recommendations  SNF;Follow surgeon's recommendation for DC plan and follow-up therapies(pt will decline so HHOT)    Equipment Recommendations  None recommended by OT(reports having all DME needed)    Recommendations for Other Services      Precautions / Restrictions Precautions Precautions: Fall Precaution Comments: reviewed precautions/positioning with pt Restrictions Weight Bearing Restrictions: Yes RLE Weight Bearing: Weight bearing as tolerated       Mobility Bed Mobility Overal bed mobility: Needs Assistance             General bed mobility comments: pt OOB in chair upon arrival  Transfers Overall transfer level: Needs assistance Equipment used: Rolling walker (2 wheeled) Transfers: Sit to/from Stand Sit to Stand: Min guard         General transfer comment: pt demonstrates safe technique and hand placement    Balance Overall balance assessment: Needs assistance Sitting-balance support: Feet  supported;Single extremity supported;No upper extremity supported Sitting balance-Leahy Scale: Good Sitting balance - Comments: requires UE support most of the time   Standing balance support: Bilateral upper extremity supported Standing balance-Leahy Scale: Poor Standing balance comment: requires RW for support                           ADL either performed or assessed with clinical judgement   ADL Overall ADL's : Needs assistance/impaired Eating/Feeding: Independent;Sitting           Lower Body Bathing: Sit to/from stand;With adaptive equipment;Sitting/lateral leans;Moderate assistance       Lower Body Dressing: With adaptive equipment;Sitting/lateral leans;Moderate assistance   Toilet Transfer: Moderate assistance;RW;BSC Toilet Transfer Details (indicate cue type and reason): increase time and reports some pain simulated in rehab gym. Pt demonstrated good sequence with stand to sit transition. VCs for sit to stand transition for hand placement with RW.      Tub/ Shower Transfer: Moderate assistance;Cueing for safety;Cueing for sequencing;3 in 1;Rolling walker Tub/Shower Transfer Details (indicate cue type and reason): Pt educated of 3n1 tub transfer with handout provided. Pt unable to demonstrate transfer due to low pain tolerance to transfer weight to R side for sequence. Pt asked to state sequence learned R min A and VCs for sequence.  Functional mobility during ADLs: Min guard;Rolling walker;Cueing for safety;Cueing for sequencing General ADL Comments: Pt educated on the use of AE (reacher and sock aid), which she states having at home. Pt received information well and demonstrated proper use of equipment.      Vision       Perception  Praxis      Cognition Arousal/Alertness: Awake/alert Behavior During Therapy: WFL for tasks assessed/performed Overall Cognitive Status: Within Functional Limits for tasks assessed                                           Exercises     Shoulder Instructions       General Comments      Pertinent Vitals/ Pain       Pain Assessment: Faces Pain Score: 7  Faces Pain Scale: Hurts little more Pain Location: R knee Pain Descriptors / Indicators: Guarding;Sore Pain Intervention(s): Limited activity within patient's tolerance;Monitored during session;Premedicated before session;Repositioned  Home Living                                          Prior Functioning/Environment              Frequency  Min 2X/week        Progress Toward Goals  OT Goals(current goals can now be found in the care plan section)  Progress towards OT goals: Progressing toward goals  Acute Rehab OT Goals Patient Stated Goal: have less pain OT Goal Formulation: With patient/family Time For Goal Achievement: 12/25/18 Potential to Achieve Goals: Good ADL Goals Pt Will Perform Lower Body Bathing: with modified independence;with adaptive equipment;sit to/from stand Pt Will Perform Lower Body Dressing: with modified independence;sit to/from stand;with adaptive equipment Pt Will Transfer to Toilet: with modified independence;ambulating;bedside commode Additional ADL Goal #1: pt will complete bed mobility mod i as precursor to adls.  Plan Discharge plan remains appropriate    Co-evaluation                 AM-PAC OT "6 Clicks" Daily Activity     Outcome Measure   Help from another person eating meals?: None Help from another person taking care of personal grooming?: A Little Help from another person toileting, which includes using toliet, bedpan, or urinal?: A Lot Help from another person bathing (including washing, rinsing, drying)?: A Lot Help from another person to put on and taking off regular upper body clothing?: A Little Help from another person to put on and taking off regular lower body clothing?: A Lot 6 Click Score: 16    End of Session Equipment Utilized  During Treatment: Gait belt;Rolling walker  OT Visit Diagnosis: Unsteadiness on feet (R26.81);Muscle weakness (generalized) (M62.81)   Activity Tolerance Patient limited by pain   Patient Left with call bell/phone within reach;with family/visitor present;in chair;with chair alarm set   Nurse Communication Mobility status;Precautions        Time: 8937-3428 OT Time Calculation (min): 32 min  Charges: OT General Charges $OT Visit: 1 Visit OT Treatments $Self Care/Home Management : 23-37 mins  Minus Breeding, MSOT, OTR/L  Supplemental Rehabilitation Services  [phone]   Marius Ditch 12/13/2018, 10:39 AM

## 2018-12-13 NOTE — Progress Notes (Signed)
Physical Therapy Treatment Patient Details Name: Debra Wright MRN: [id number] DOB: Jan 18, 1951 Today's Date: 12/13/2018    History of Present Illness 68 y.o. female, has a history of pain and functional disability in the right knee due to arthritis and has failed non-surgical conservative treatments. s/p R TKA 12/10/2018. PMH: L TKA, HTN, Morbid obesity, Afib, OSA and R shoulder replacement.    PT Comments    Patient seen for mobility progression. Pt continues to make progress toward PT goals and tolerated increased weight bearing this session. Pt will continue to benefit from further skilled PT services to maximize independence and safety with mobility.   Follow Up Recommendations  Follow surgeon's recommendation for DC plan and follow-up therapies     Equipment Recommendations  None recommended by PT    Recommendations for Other Services       Precautions / Restrictions Precautions Precautions: Fall Precaution Comments: reviewed precautions/positioning with pt Restrictions Weight Bearing Restrictions: Yes RLE Weight Bearing: Weight bearing as tolerated    Mobility  Bed Mobility Overal bed mobility: Needs Assistance Bed Mobility: Supine to Sit     Supine to sit: Min assist     General bed mobility comments: pt OOB in chair upon arrival  Transfers Overall transfer level: Needs assistance Equipment used: Rolling walker (2 wheeled) Transfers: Sit to/from Stand Sit to Stand: Min guard         General transfer comment: pt demonstrates safe technique and hand placement  Ambulation/Gait Ambulation/Gait assistance: Min guard;Supervision Gait Distance (Feet): 75 Feet Assistive device: Rolling walker (2 wheeled) Gait Pattern/deviations: Step-to pattern;Step-through pattern;Decreased stance time - right;Decreased step length - left;Decreased weight shift to right;Antalgic Gait velocity: decreased   General Gait Details: cues for posture and increased L step length and  height; pt with improving step through pattern and weight bearing    Stairs Stairs: Yes Stairs assistance: Mod assist Stair Management: No rails;Step to pattern;Backwards;With walker Number of Stairs: 1 General stair comments: cues for sequencing and technique; assistance required for R knee stabilization and multimodal cues for R quad activation    Wheelchair Mobility    Modified Rankin (Stroke Patients Only)       Balance Overall balance assessment: Needs assistance Sitting-balance support: Feet supported;Single extremity supported;No upper extremity supported Sitting balance-Leahy Scale: Good Sitting balance - Comments: requires UE support most of the time   Standing balance support: Bilateral upper extremity supported Standing balance-Leahy Scale: Poor Standing balance comment: requires RW for support                            Cognition Arousal/Alertness: Awake/alert Behavior During Therapy: WFL for tasks assessed/performed Overall Cognitive Status: Within Functional Limits for tasks assessed                                        Exercises Total Joint Exercises Ankle Circles/Pumps: AROM;Both Quad Sets: AROM;Both Short Arc Quad: AAROM;Right Heel Slides: AAROM;Right Hip ABduction/ADduction: AAROM;Right Straight Leg Raises: AAROM;Right General Exercises - Lower Extremity Heel Slides: AROM;Right;10 reps;Supine    General Comments        Pertinent Vitals/Pain Pain Assessment: Faces Faces Pain Scale: Hurts little more Pain Location: R knee Pain Descriptors / Indicators: Guarding;Sore Pain Intervention(s): Limited activity within patient's tolerance;Monitored during session;Premedicated before session;Repositioned    Home Living  Prior Function            PT Goals (current goals can now be found in the care plan section) Acute Rehab PT Goals Patient Stated Goal: have less pain Progress towards PT  goals: Progressing toward goals    Frequency    7X/week      PT Plan Current plan remains appropriate    Co-evaluation              AM-PAC PT "6 Clicks" Mobility   Outcome Measure  Help needed turning from your back to your side while in a flat bed without using bedrails?: A Little Help needed moving from lying on your back to sitting on the side of a flat bed without using bedrails?: A Little Help needed moving to and from a bed to a chair (including a wheelchair)?: A Little Help needed standing up from a chair using your arms (e.g., wheelchair or bedside chair)?: None Help needed to walk in hospital room?: A Little Help needed climbing 3-5 steps with a railing? : A Lot 6 Click Score: 18    End of Session Equipment Utilized During Treatment: Gait belt Activity Tolerance: Patient tolerated treatment well Patient left: Other (comment)(pt in ortho gym with OT) Nurse Communication: Mobility status PT Visit Diagnosis: Unsteadiness on feet (R26.81);Other abnormalities of gait and mobility (R26.89);Muscle weakness (generalized) (M62.81);Difficulty in walking, not elsewhere classified (R26.2);Pain Pain - Right/Left: Right Pain - part of body: Knee     Time: 7564-3329 PT Time Calculation (min) (ACUTE ONLY): 31 min  Charges:  $Gait Training: 23-37 mins                     Debra Wright, PTA Acute Rehabilitation Services Pager: [phone] Office: [phone]     Darliss Cheney 12/13/2018, 10:35 AM

## 2018-12-13 NOTE — Progress Notes (Signed)
Patient ID: Debra Wright, female   DOB: 16-Aug-1951, 68 y.o.   MRN: [id number] Can dischargee to home today.  Knee stable.

## 2018-12-13 NOTE — Care Management Important Message (Signed)
Important Message  Patient Details  Name: Debra Wright MRN: [id number] Date of Birth: November 18, 1950   Medicare Important Message Given:  Yes    Orbie Pyo 12/13/2018, 3:05 PM

## 2018-12-13 NOTE — Progress Notes (Signed)
Physical Therapy Treatment Patient Details Name: Debra Wright MRN: [id number] DOB: 03-Mar-1951 Today's Date: 12/13/2018    History of Present Illness 68 y.o. female, has a history of pain and functional disability in the right knee due to arthritis and has failed non-surgical conservative treatments. s/p R TKA 12/10/2018. PMH: L TKA, HTN, Morbid obesity, Afib, OSA and R shoulder replacement.    PT Comments    Patient seen for mobility progression.  Pt tolerated gait training distance of 50 ft and the rest of HEP exercises well. Pt continues to require supervision/min guard for safety with all OOB mobility and assistance with exercises due to poor quad control. Pt given HEP handout. Continue to progress as tolerated.    Follow Up Recommendations  Follow surgeon's recommendation for DC plan and follow-up therapies     Equipment Recommendations  None recommended by PT    Recommendations for Other Services       Precautions / Restrictions Precautions Precautions: Fall Precaution Comments: reviewed precautions/positioning with pt Restrictions Weight Bearing Restrictions: Yes RLE Weight Bearing: Weight bearing as tolerated    Mobility  Bed Mobility               General bed mobility comments: pt OOB in chair upon arrival  Transfers Overall transfer level: Needs assistance Equipment used: Rolling walker (2 wheeled) Transfers: Sit to/from Stand Sit to Stand: Min guard         General transfer comment: pt with some unsteadiness with eccentric loading but no overt LOB  Ambulation/Gait Ambulation/Gait assistance: Min guard;Supervision Gait Distance (Feet): 50 Feet Assistive device: Rolling walker (2 wheeled) Gait Pattern/deviations: Step-to pattern;Decreased stance time - right;Decreased step length - left;Decreased weight shift to right;Antalgic Gait velocity: decreased   General Gait Details: cues for step length symmetry   Stairs             Wheelchair  Mobility    Modified Rankin (Stroke Patients Only)       Balance Overall balance assessment: Needs assistance Sitting-balance support: Feet supported;Single extremity supported;No upper extremity supported Sitting balance-Leahy Scale: Good     Standing balance support: Bilateral upper extremity supported Standing balance-Leahy Scale: Poor Standing balance comment: requires RW for support                            Cognition Arousal/Alertness: Awake/alert Behavior During Therapy: WFL for tasks assessed/performed Overall Cognitive Status: Within Functional Limits for tasks assessed                                        Exercises Total Joint Exercises Straight Leg Raises: AAROM;Right;10 reps;Limitations Straight Leg Raises Limitations: needs assistance to lift Long Arc Quad: Limitations;AAROM;Seated Long Arc Quad Limitations: unable to lift foot from ground but attempting  Knee Flexion: AROM;AAROM;Right;10 reps;Seated    General Comments        Pertinent Vitals/Pain Pain Assessment: Faces Faces Pain Scale: Hurts little more Pain Location: R knee Pain Descriptors / Indicators: Guarding;Sore;Grimacing Pain Intervention(s): Limited activity within patient's tolerance;Monitored during session;Repositioned    Home Living                      Prior Function            PT Goals (current goals can now be found in the care plan section) Acute Rehab PT Goals  Patient Stated Goal: have less pain Progress towards PT goals: Progressing toward goals    Frequency    7X/week      PT Plan Current plan remains appropriate    Co-evaluation              AM-PAC PT "6 Clicks" Mobility   Outcome Measure  Help needed turning from your back to your side while in a flat bed without using bedrails?: A Little Help needed moving from lying on your back to sitting on the side of a flat bed without using bedrails?: A Little Help needed  moving to and from a bed to a chair (including a wheelchair)?: A Little Help needed standing up from a chair using your arms (e.g., wheelchair or bedside chair)?: None Help needed to walk in hospital room?: A Little Help needed climbing 3-5 steps with a railing? : A Lot 6 Click Score: 18    End of Session Equipment Utilized During Treatment: Gait belt Activity Tolerance: Patient tolerated treatment well Patient left: in chair;with call bell/phone within reach Nurse Communication: Mobility status PT Visit Diagnosis: Unsteadiness on feet (R26.81);Other abnormalities of gait and mobility (R26.89);Muscle weakness (generalized) (M62.81);Difficulty in walking, not elsewhere classified (R26.2);Pain Pain - Right/Left: Right Pain - part of body: Knee     Time: 0233-4356 PT Time Calculation (min) (ACUTE ONLY): 31 min  Charges:  $Gait Training: 8-22 mins $Therapeutic Exercise: 8-22 mins                     Earney Navy, PTA Acute Rehabilitation Services Pager: [phone] Office: [phone]     Darliss Cheney 12/13/2018, 2:26 PM

## 2018-12-13 NOTE — Progress Notes (Signed)
Patient discharging home. Discharge instructions explained to patient and she verbalized understanding. Took all personal belongings. No further questions or concerns voiced.  

## 2018-12-13 NOTE — Discharge Summary (Signed)
Patient ID: Debra Wright MRN: [id number] DOB/AGE: 1951-01-01 68 y.o.  Admit date: 12/10/2018 Discharge date: 12/13/2018  Admission Diagnoses:  Principal Problem:   Unilateral primary osteoarthritis, right knee Active Problems:   Status post total right knee replacement   Discharge Diagnoses:  Same  Past Medical History:  Diagnosis Date  . Anxiety   . Arthritis    "knees; right thumb" (10/14/2014)  . Basal cell carcinoma    "burned off upper lip & right shoulder"  . Chronic anticoagulation    CHADS VASC=3  . Complication of anesthesia    after spinal anesthesia for left knee replacement, bladder did "not wake up". Was incontinent for 4 days post surgery  . Depression   . Fibroid tumor   . Frequent diarrhea   . GERD (gastroesophageal reflux disease)   . High cholesterol   . Hypertension    EF 65-70% grade 2DD  . Insomnia   . Migraine    "frequently when I was younger; maybe monthly now" (10/14/2014) - none after hysterectomy  . PAF (paroxysmal atrial fibrillation) ([place]) 05/02/2018   converted with rate control  . Pernicious anemia    pt not aware of this  . Sleep apnea    uses a cpap  . Type II diabetes mellitus ([place])   . Vitamin B 12 deficiency     Surgeries: Procedure(s): RIGHT TOTAL KNEE ARTHROPLASTY on 12/10/2018   Consultants:   Discharged Condition: Improved  Hospital Course: Debra Wright is an 68 y.o. female who was admitted 12/10/2018 for operative treatment ofUnilateral primary osteoarthritis, right knee. Patient has severe unremitting pain that affects sleep, daily activities, and work/hobbies. After pre-op clearance the patient was taken to the operating room on 12/10/2018 and underwent  Procedure(s): RIGHT TOTAL KNEE ARTHROPLASTY.    Patient was given perioperative antibiotics:  Anti-infectives (From admission, onward)   Start     Dose/Rate Route Frequency Ordered Stop   12/11/18 0600  ceFAZolin (ANCEF) IVPB 2g/100 mL premix     2 g 200 mL/hr over  30 Minutes Intravenous On call to O.R. 12/10/18 1251 12/10/18 1524   12/10/18 2100  ceFAZolin (ANCEF) IVPB 1 g/50 mL premix     1 g 100 mL/hr over 30 Minutes Intravenous Every 6 hours 12/10/18 1948 12/11/18 0241   12/10/18 1306  ceFAZolin (ANCEF) 2-4 GM/100ML-% IVPB    Note to Pharmacy:  Ardine Eng   : cabinet override      12/10/18 1306 12/10/18 1524       Patient was given sequential compression devices, early ambulation, and chemoprophylaxis to prevent DVT.  Patient benefited maximally from hospital stay and there were no complications.    Recent vital signs:  Patient Vitals for the past 24 hrs:  BP Temp Temp src Pulse Resp SpO2  12/13/18 0542 (!) 143/54 - - 74 18 95 %  12/12/18 2018 (!) 150/50 98.9 F (37.2 C) Oral 82 19 96 %  12/12/18 0759 119/60 98.4 F (36.9 C) Oral 77 20 100 %     Recent laboratory studies:  Recent Labs    12/11/18 0506 12/12/18 0306 12/13/18 0315  WBC 13.5* 11.7*  --   HGB 10.2* 8.8*  --   HCT 32.8* 28.2*  --   PLT 313 251  --   NA 135  --   --   K 3.7  --   --   CL 101  --   --   CO2 25  --   --  BUN <5*  --   --   CREATININE 0.61  --   --   GLUCOSE 223*  --   --   INR 1.2 1.2 1.6*  CALCIUM 8.3*  --   --      Discharge Medications:   Allergies as of 12/13/2018      Reactions   Antivert [meclizine Hcl] Other (See Comments)   "makes me feel like my skin is falling off".      Medication List    STOP taking these medications   diclofenac sodium 1 % Gel Commonly known as:  VOLTAREN   HYDROcodone-acetaminophen 5-325 MG tablet Commonly known as:  Norco   traMADol 50 MG tablet Commonly known as:  ULTRAM     TAKE these medications   atorvastatin 40 MG tablet Commonly known as:  LIPITOR TAKE 1 TABLET BY MOUTH ONCE DAILY What changed:  when to take this   CALCIUM 600 + D PO Take 1 tablet by mouth daily.   calcium carbonate 500 MG chewable tablet Commonly known as:  TUMS - dosed in mg elemental calcium Chew 2 tablets by  mouth daily as needed for indigestion or heartburn.   CINNAMON PO Take 1 tablet by mouth daily.   clonazePAM 0.5 MG tablet Commonly known as:  KLONOPIN TAKE 1/2 TAB BY MOUTH AT BEDTIME AS NEEDED What changed:    how much to take  how to take this  when to take this  reasons to take this  additional instructions   diltiazem 180 MG 24 hr capsule Commonly known as:  CARDIZEM CD Take 1 capsule (180 mg total) by mouth daily.   DULoxetine 60 MG capsule Commonly known as:  Cymbalta Take 1 capsule (60 mg total) by mouth daily.   Eszopiclone 3 MG Tabs Take 1 tablet (3 mg total) by mouth at bedtime. Take immediately before bedtime   fluconazole 100 MG tablet Commonly known as:  Diflucan Take 1 tablet (100 mg total) by mouth daily.   glipiZIDE 5 MG 24 hr tablet Commonly known as:  glipiZIDE XL TAKE 1 TABLET BY MOUTH ONCE DAILY WITH BREAKFAST What changed:    how much to take  how to take this  when to take this   magnesium oxide 400 (241.3 Mg) MG tablet Commonly known as:  MAG-OX Take 400 mg by mouth daily.   metFORMIN 1000 MG tablet Commonly known as:  Glucophage Take 1 tablet (1,000 mg total) by mouth 2 (two) times daily with a meal.   methocarbamol 500 MG tablet Commonly known as:  ROBAXIN Take 1 tablet (500 mg total) by mouth every 6 (six) hours as needed for muscle spasms.   metoprolol tartrate 25 MG tablet Commonly known as:  LOPRESSOR Take 0.5 tablets (12.5 mg total) by mouth 2 (two) times daily.   Omega-3 Fish Oil 1200 MG Caps Take 2,400 mg by mouth 2 (two) times daily.   omeprazole 40 MG capsule Commonly known as:  PRILOSEC TAKE 1 CAPSULE BY MOUTH ONCE DAILY IN THE MORNING What changed:  See the new instructions.   oxyCODONE 5 MG immediate release tablet Commonly known as:  Oxy IR/ROXICODONE Take 1-2 tablets (5-10 mg total) by mouth every 4 (four) hours as needed for moderate pain (pain score 4-6).   warfarin 5 MG tablet Commonly known as:   COUMADIN Take as directed. If you are unsure how to take this medication, talk to your nurse or doctor. Original instructions:  Take 1 tablet (5 mg total)  by mouth daily. What changed:    how much to take  when to take this  additional instructions            Durable Medical Equipment  (From admission, onward)         Start     Ordered   12/10/18 1949  DME 3 n 1  Once     12/10/18 1948   12/10/18 1949  DME Walker rolling  Once    Question:  Patient needs a walker to treat with the following condition  Answer:  Status post total right knee replacement   12/10/18 1948          Diagnostic Studies: Dg Knee Right Port  Result Date: 12/10/2018 CLINICAL DATA:  Status post right knee replacement EXAM: PORTABLE RIGHT KNEE - 1-2 VIEW COMPARISON:  08/19/2018 FINDINGS: Interval right knee replacement with normal alignment and intact hardware. No fracture. Moderate gas in the soft tissues consistent with recent surgery. IMPRESSION: Right knee replacement with expected postsurgical changes Electronically Signed   By: Donavan Foil M.D.   On: 12/10/2018 19:43    Disposition: Discharge disposition: 01-Home or Self Care         Follow-up Information    Mcarthur Rossetti, MD. Go on 12/24/2018.   Specialty:  Orthopedic Surgery Why:  Your appointment for follow up is scheduled for 10:30 am. Contact information: [place] [address] [place], [place]. Go to.   Specialty:  [facility] Why:  You will have 5 home health visits prior to follow up with MD.       [facility]. Go on 12/25/2018.   Why:  Your appointment with Outpatient PT is scheduled for 1:15 pm. Please arrive approximately 15 minutes early for paperwork. Contact information: [address] [place], [place] [address] [phone]           Signed: Mcarthur Rossetti 12/13/2018, 6:53 AM

## 2018-12-14 DIAGNOSIS — Z7984 Long term (current) use of oral hypoglycemic drugs: Secondary | ICD-10-CM | POA: Diagnosis not present

## 2018-12-14 DIAGNOSIS — Z7901 Long term (current) use of anticoagulants: Secondary | ICD-10-CM | POA: Diagnosis not present

## 2018-12-14 DIAGNOSIS — E119 Type 2 diabetes mellitus without complications: Secondary | ICD-10-CM | POA: Diagnosis not present

## 2018-12-14 DIAGNOSIS — Z471 Aftercare following joint replacement surgery: Secondary | ICD-10-CM | POA: Diagnosis not present

## 2018-12-14 DIAGNOSIS — Z96651 Presence of right artificial knee joint: Secondary | ICD-10-CM | POA: Diagnosis not present

## 2018-12-16 ENCOUNTER — Telehealth (INDEPENDENT_AMBULATORY_CARE_PROVIDER_SITE_OTHER): Payer: Self-pay | Admitting: Orthopaedic Surgery

## 2018-12-16 ENCOUNTER — Telehealth (INDEPENDENT_AMBULATORY_CARE_PROVIDER_SITE_OTHER): Payer: Self-pay | Admitting: *Deleted

## 2018-12-16 NOTE — Telephone Encounter (Signed)
L[facility] with [facility] called in to get verbal orders for pt 1 time a week for 1 week, 3 times a week for 1 week, and 1 time a week for 1 week for post op knee surgery. Please call [phone]

## 2018-12-16 NOTE — Telephone Encounter (Signed)
Verbal order left on VM  

## 2018-12-16 NOTE — Telephone Encounter (Signed)
Ortho Bundle Telephone Call completed.

## 2018-12-17 DIAGNOSIS — E119 Type 2 diabetes mellitus without complications: Secondary | ICD-10-CM | POA: Diagnosis not present

## 2018-12-17 DIAGNOSIS — Z471 Aftercare following joint replacement surgery: Secondary | ICD-10-CM | POA: Diagnosis not present

## 2018-12-17 DIAGNOSIS — Z7901 Long term (current) use of anticoagulants: Secondary | ICD-10-CM | POA: Diagnosis not present

## 2018-12-17 DIAGNOSIS — Z96651 Presence of right artificial knee joint: Secondary | ICD-10-CM | POA: Diagnosis not present

## 2018-12-17 DIAGNOSIS — Z7984 Long term (current) use of oral hypoglycemic drugs: Secondary | ICD-10-CM | POA: Diagnosis not present

## 2018-12-18 ENCOUNTER — Telehealth (INDEPENDENT_AMBULATORY_CARE_PROVIDER_SITE_OTHER): Payer: Self-pay | Admitting: *Deleted

## 2018-12-18 NOTE — Telephone Encounter (Signed)
Ortho Bundle phone call made.

## 2018-12-19 ENCOUNTER — Other Ambulatory Visit (INDEPENDENT_AMBULATORY_CARE_PROVIDER_SITE_OTHER): Payer: Self-pay | Admitting: Orthopaedic Surgery

## 2018-12-19 ENCOUNTER — Telehealth (INDEPENDENT_AMBULATORY_CARE_PROVIDER_SITE_OTHER): Payer: Self-pay | Admitting: *Deleted

## 2018-12-19 ENCOUNTER — Other Ambulatory Visit (INDEPENDENT_AMBULATORY_CARE_PROVIDER_SITE_OTHER): Payer: Self-pay | Admitting: Family Medicine

## 2018-12-19 ENCOUNTER — Encounter (INDEPENDENT_AMBULATORY_CARE_PROVIDER_SITE_OTHER): Payer: Self-pay

## 2018-12-19 ENCOUNTER — Ambulatory Visit: Payer: Medicare Other | Admitting: Cardiovascular Disease

## 2018-12-19 DIAGNOSIS — Z7901 Long term (current) use of anticoagulants: Secondary | ICD-10-CM | POA: Diagnosis not present

## 2018-12-19 DIAGNOSIS — Z471 Aftercare following joint replacement surgery: Secondary | ICD-10-CM | POA: Diagnosis not present

## 2018-12-19 DIAGNOSIS — Z96651 Presence of right artificial knee joint: Secondary | ICD-10-CM | POA: Diagnosis not present

## 2018-12-19 DIAGNOSIS — Z7984 Long term (current) use of oral hypoglycemic drugs: Secondary | ICD-10-CM | POA: Diagnosis not present

## 2018-12-19 DIAGNOSIS — E119 Type 2 diabetes mellitus without complications: Secondary | ICD-10-CM | POA: Diagnosis not present

## 2018-12-19 MED ORDER — OXYCODONE HCL 5 MG PO TABS: 5.0000 mg | ORAL_TABLET | ORAL | 0 refills | Status: DC | PRN

## 2018-12-19 MED ORDER — CLONAZEPAM 0.5 MG PO TABS: ORAL_TABLET | ORAL | 0 refills | Status: DC

## 2018-12-19 NOTE — Care Plan (Signed)
RNCM contacted patient for 1 week post-surgery call. Attempted 2 other times this week to contact for discharge call unsuccessfully. Patient reports she is doing well with HHPT. Missed her appointment today with Dr. Claiborne Billings to have INR drawn as she felt she couldn't easily get to that office due to distance having to walk. RNCM informed she should contact that office to determine when they need her to return for her INR draw. No complications noted at this time. Reminded of appointment with Dr. Ninfa Linden for 2 week post-op on 12/24/18. Patient aware of how to reach [facility] with any further needs. Patient also reports she has sent a message through [facility] regarding a refill of her pain medication. She states the pharmacy originally would only fill half of the medication prescribed by MD and now needs a new order. RNCM informed a message has been sent by clinical staff to her MD regarding this.

## 2018-12-19 NOTE — Telephone Encounter (Signed)
Ortho Bundle 1 week phone call met.

## 2018-12-19 NOTE — Telephone Encounter (Signed)
[facility] patient 

## 2018-12-19 NOTE — Telephone Encounter (Signed)
Can you advise? 

## 2018-12-19 NOTE — Telephone Encounter (Signed)
Can you advise? Patient just had surgery with Ninfa Linden

## 2018-12-20 ENCOUNTER — Telehealth (INDEPENDENT_AMBULATORY_CARE_PROVIDER_SITE_OTHER): Payer: Self-pay | Admitting: Orthopaedic Surgery

## 2018-12-20 NOTE — Telephone Encounter (Signed)
Debra Wright called stating that pt canceled pt for today. She will be discharged Monday and moved to out patient therapy.

## 2018-12-23 ENCOUNTER — Telehealth (INDEPENDENT_AMBULATORY_CARE_PROVIDER_SITE_OTHER): Payer: Self-pay

## 2018-12-23 DIAGNOSIS — E119 Type 2 diabetes mellitus without complications: Secondary | ICD-10-CM | POA: Diagnosis not present

## 2018-12-23 DIAGNOSIS — Z471 Aftercare following joint replacement surgery: Secondary | ICD-10-CM | POA: Diagnosis not present

## 2018-12-23 DIAGNOSIS — Z96651 Presence of right artificial knee joint: Secondary | ICD-10-CM | POA: Diagnosis not present

## 2018-12-23 DIAGNOSIS — Z7984 Long term (current) use of oral hypoglycemic drugs: Secondary | ICD-10-CM | POA: Diagnosis not present

## 2018-12-23 DIAGNOSIS — Z7901 Long term (current) use of anticoagulants: Secondary | ICD-10-CM | POA: Diagnosis not present

## 2018-12-23 NOTE — Telephone Encounter (Signed)
  Called patient and asked the screening questions.  Do you have now or have you had in the past 7 days a fever and/or chills? NO  Do you have now or have you had in the past 7 days a cough? NO  Do you have now or have you had in the last 7 days nausea, vomiting or abdominal pain? NO  Have you been exposed to anyone who has tested positive for COVID-19? NO  Have you or anyone who lives with you traveled within the last month? NO 

## 2018-12-23 NOTE — Telephone Encounter (Signed)
FYI

## 2018-12-24 ENCOUNTER — Encounter (INDEPENDENT_AMBULATORY_CARE_PROVIDER_SITE_OTHER): Payer: Self-pay | Admitting: Orthopaedic Surgery

## 2018-12-24 ENCOUNTER — Other Ambulatory Visit: Payer: Self-pay

## 2018-12-24 ENCOUNTER — Ambulatory Visit (INDEPENDENT_AMBULATORY_CARE_PROVIDER_SITE_OTHER): Payer: Medicare Other | Admitting: Orthopaedic Surgery

## 2018-12-24 DIAGNOSIS — Z96651 Presence of right artificial knee joint: Secondary | ICD-10-CM

## 2018-12-24 MED ORDER — OXYCODONE HCL 5 MG PO TABS: 5.0000 mg | ORAL_TABLET | ORAL | 0 refills | Status: DC | PRN

## 2018-12-24 NOTE — Progress Notes (Signed)
HPI: Mrs. Beechy returns today 2 weeks status post right total knee arthroplasty.  She is overall doing well.  She states that her range of motion strength improving.  She has outpatient physical therapy set up.  She is on chronic Coumadin.  No fevers chills.  Physical exam: Right total knee with subcu stitch well approximates the incision.  No signs of infection.  Full extension flexion to 85 degrees.  Calf supple nontender able dorsiflex plantarflex the ankle.  Impression: Status post right total knee arthroplasty 2 weeks  Plan: Continue to work with physical therapy for range of motion strengthening.  She is able to get incision wet in shower.  New Steri-Strips applied today she will let these fall off.  Scar tissue mobilization encouraged.  We will see her back in a month sooner if there is any questions or concerns.

## 2018-12-24 NOTE — Care Plan (Signed)
RNCM met with patient in office for 2 week post-op visit with Benita Stabile, PA. Patient walking with rolling walker and reports she is doing well. Pain is managed. Med refill given at today's appointment per PA. Has completed HHPT and is scheduled for OPPT beginning tomorrow, 12/25/18 with [facility] [place] location. Post-surgery dressing removed and incision observed by PA. Steri-strips applied. No complications noted. Follow up in 1 month per PA. Being managed by the [facility] for chronic Coumadin use.

## 2018-12-25 ENCOUNTER — Encounter: Payer: Self-pay | Admitting: Physical Therapy

## 2018-12-25 ENCOUNTER — Other Ambulatory Visit (INDEPENDENT_AMBULATORY_CARE_PROVIDER_SITE_OTHER): Payer: Self-pay | Admitting: Orthopaedic Surgery

## 2018-12-25 ENCOUNTER — Ambulatory Visit: Payer: Medicare Other | Attending: Orthopaedic Surgery | Admitting: Physical Therapy

## 2018-12-25 DIAGNOSIS — M25561 Pain in right knee: Secondary | ICD-10-CM | POA: Diagnosis not present

## 2018-12-25 DIAGNOSIS — M25661 Stiffness of right knee, not elsewhere classified: Secondary | ICD-10-CM | POA: Insufficient documentation

## 2018-12-25 DIAGNOSIS — R262 Difficulty in walking, not elsewhere classified: Secondary | ICD-10-CM | POA: Insufficient documentation

## 2018-12-25 DIAGNOSIS — R600 Localized edema: Secondary | ICD-10-CM | POA: Diagnosis not present

## 2018-12-25 NOTE — Therapy (Signed)
[place], [place], [address] Phone: [phone]   Fax:  [phone]  Physical Therapy Evaluation  Patient Details  Name: Debra Wright MRN: [id number] Date of Birth: 07-19-1951 Referring Provider (PT): Ninfa Linden   Encounter Date: 12/25/2018  PT End of Session - 12/25/18 1217    Visit Number  1    Date for PT Re-Evaluation  02/24/19    Authorization Type  Medicare    PT Start Time  1149    PT Stop Time  1245    PT Time Calculation (min)  56 min    Activity Tolerance  Patient tolerated treatment well;Patient limited by pain    Behavior During Therapy  [facility] for tasks assessed/performed       Past Medical History:  Diagnosis Date  . Anxiety   . Arthritis    "knees; right thumb" (10/14/2014)  . Basal cell carcinoma    "burned off upper lip & right shoulder"  . Chronic anticoagulation    CHADS VASC=3  . Complication of anesthesia    after spinal anesthesia for left knee replacement, bladder did "not wake up". Was incontinent for 4 days post surgery  . Depression   . Fibroid tumor   . Frequent diarrhea   . GERD (gastroesophageal reflux disease)   . High cholesterol   . Hypertension    EF 65-70% grade 2DD  . Insomnia   . Migraine    "frequently when I was younger; maybe monthly now" (10/14/2014) - none after hysterectomy  . PAF (paroxysmal atrial fibrillation) ([place]) 05/02/2018   converted with rate control  . Pernicious anemia    pt not aware of this  . Sleep apnea    uses a cpap  . Type II diabetes mellitus ([place])   . Vitamin B 12 deficiency     Past Surgical History:  Procedure Laterality Date  . ABDOMINAL HYSTERECTOMY  1988  . ACHILLES TENDON SURGERY Right   . APPENDECTOMY  1988  . BILATERAL OOPHORECTOMY  2004  . CARPAL TUNNEL RELEASE Bilateral 1986  . CATARACT EXTRACTION W/ INTRAOCULAR LENS  IMPLANT, BILATERAL Bilateral 2011  . COLONOSCOPY    . JOINT REPLACEMENT    .  SHOULDER ARTHROSCOPY DISTAL CLAVICLE EXCISION AND OPEN ROTATOR CUFF REPAIR Right 11/2013  . TOENAIL EXCISION Right 04/2018   "big toe"  . TONSILLECTOMY  1950's  . TOTAL KNEE ARTHROPLASTY Left 10/13/2014   Procedure: LEFT TOTAL KNEE ARTHROPLASTY;  Surgeon: Mcarthur Rossetti, MD;  Location: [facility];  Service: Orthopedics;  Laterality: Left;  . TOTAL KNEE ARTHROPLASTY Right 12/10/2018   Procedure: RIGHT TOTAL KNEE ARTHROPLASTY;  Surgeon: Mcarthur Rossetti, MD;  Location: [facility];  Service: Orthopedics;  Laterality: Right;    There were no vitals filed for this visit.   Subjective Assessment - 12/25/18 1201    Subjective  Patient underwent a right TKR on 12/10/18, 2 nights in the hospital and then 4 home PT visits.  Reports she feels like things have been going well    Pertinent History  R Achilles tendon repair 05/01/16; L TKR 10/13/14; R knee OA in need of TKR    Limitations  Walking;Standing;House hold activities    Patient Stated Goals  "back to walking normal w/o pain" good ROM    Currently in Pain?  Yes    Pain Score  7     Pain Location  Knee    Pain Orientation  Right  Pain Descriptors / Indicators  Aching;Sore    Pain Type  Acute pain;Surgical pain    Pain Onset  1 to 4 weeks ago    Pain Frequency  Constant    Aggravating Factors   reports a constant ache, worse with walking and bending, pain will go up to 9/10    Pain Relieving Factors  rest and ice, pain meds, at best pain a 5/10    Effect of Pain on Daily Activities  difficulty walking, standing and sleeping         [facility] PT Assessment - 12/25/18 0001      Assessment   Medical Diagnosis  s/p right TKR    Referring Provider (PT)  Ninfa Linden    Onset Date/Surgical Date  12/10/18      Precautions   Precautions  None      Restrictions   Weight Bearing Restrictions  No      Balance Screen   Has the patient fallen in the past 6 months  No    Has the patient had a decrease in activity level because of a fear of  falling?   No    Is the patient reluctant to leave their home because of a fear of falling?   No      Home Environment   Additional Comments  normally would do her own housework, husband is disabled she does have to care for him      Prior Function   Level of Independence  Independent with community mobility with device    Vocation  Retired    Leisure  no exericse      Observation/Other Assessments-Edema    Edema  Circumferential      Circumferential Edema   Circumferential - Right  51.5 cm mid patella    Circumferential - Left   45 cm      AROM   AROM Assessment Site  Knee    Right/Left Knee  Right    Right Knee Extension  20    Right Knee Flexion  62      PROM   PROM Assessment Site  Knee    Right/Left Knee  Right    Right Knee Extension  18    Right Knee Flexion  73      Strength   Overall Strength Comments  4-/5 in the available ROM      Palpation   Palpation comment  patients leg feels very dense and swollen,  she is tender, guarded has difficulty relaxing      Ambulation/Gait   Gait Comments  uses a 4WW, slow, stiff on the right, antalgic on the right      Standardized Balance Assessment   Standardized Balance Assessment  Timed Up and Go Test      Timed Up and Go Test   Normal TUG (seconds)  37                Objective measurements completed on examination: See above findings.      [facility] Adult PT Treatment/Exercise - 12/25/18 0001      Exercises   Exercises  Knee/Hip      Knee/Hip Exercises: Aerobic   Nustep  level 3 x 5 minutes      Modalities   Modalities  Vasopneumatic      Vasopneumatic   Number Minutes Vasopneumatic   10 minutes    Vasopnuematic Location   Knee    Vasopneumatic Pressure  Medium    Vasopneumatic Temperature  61             PT Education - 12/25/18 1217    Education provided  Yes    Education Details  low load long duration stretches for flexion and extension, also encouraged continued HEP from HHPT     Person(s) Educated  Patient    Methods  Explanation;Demonstration;Handout;Verbal cues    Comprehension  Verbalized understanding       PT Short Term Goals - 12/25/18 1226      PT SHORT TERM GOAL #1   Title  Independent with initial HEP     Time  2    Period  Weeks    Status  New        PT Long Term Goals - 12/25/18 1226      PT LONG TERM GOAL #1   Title  Independent with advanced HEP    Time  8    Period  Weeks    Status  New      PT LONG TERM GOAL #2   Title  increase right knee AROM to 5-115 degrees flexion    Time  8    Period  Weeks    Status  New      PT LONG TERM GOAL #3   Title  walk with least restrictive device 500 feet and minimal deviation    Time  8    Period  Weeks    Status  New      PT LONG TERM GOAL #4   Title  increase strength to 4+/5    Time  8    Period  Weeks    Status  New      PT LONG TERM GOAL #5   Title  Able to walk community distances w/oknee pain    Time  8    Period  Weeks    Status  New             Plan - 12/25/18 1218    Clinical Impression Statement  Patient underwent a right TKR 12/10/18.  She has very limited ROM 20-65 degrees flexion, she reports a high rating of pain.  Is having difficulty with transfers and walking.  She does have significant edema of the right knee, her TUG was 27 seconds    Personal Factors and Comorbidities  Comorbidity 3+;Fitness    Comorbidities  DM, anxiety, depression, past achiiles rupture    Examination-Activity Limitations  Bed Mobility;Stairs;Squat;Locomotion Level;Stand;Toileting;Transfers    Examination-Participation Restrictions  Shop;Cleaning;Driving    Stability/Clinical Decision Making  Evolving/Moderate complexity    Clinical Decision Making  Moderate    Rehab Potential  Good    PT Frequency  3x / week    PT Duration  8 weeks    PT Treatment/Interventions  ADLs/Self Care Home Management;Cryotherapy;Electrical Stimulation;Functional mobility training;Stair training;Gait  training;Therapeutic activities;Therapeutic exercise;Balance training;Neuromuscular re-education;Manual techniques;Patient/family education;Vasopneumatic Device    PT Next Visit Plan  get her moving and work on ROM    Consulted and Agree with Plan of Care  Patient       Patient will benefit from skilled therapeutic intervention in order to improve the following deficits and impairments:  Abnormal gait, Cardiopulmonary status limiting activity, Decreased activity tolerance, Decreased balance, Difficulty walking, Increased edema, Impaired flexibility, Decreased strength, Decreased range of motion, Decreased endurance, Decreased mobility, Decreased scar mobility, Pain  Visit Diagnosis: Acute pain of right knee - Plan: PT plan of care cert/re-cert  Stiffness of right knee, not elsewhere classified - Plan:  PT plan of care cert/re-cert  Difficulty in walking, not elsewhere classified - Plan: PT plan of care cert/re-cert  Localized edema - Plan: PT plan of care cert/re-cert     Problem List Patient Active Problem List   Diagnosis Date Noted  . Unilateral primary osteoarthritis, right knee 12/10/2018  . Status post total right knee replacement 12/10/2018  . Long term (current) use of anticoagulants 08/16/2018  . Hypertensive cardiovascular disease 06/04/2018  . Morbid obesity with BMI of 40.0-44.9, adult ([place]) 06/04/2018  . Fatigue 06/04/2018  . Chronic anticoagulation   . Atrial fibrillation and flutter ([place]) 05/02/2018  . Atrial fibrillation with RVR ([place]) 05/02/2018  . PAF (paroxysmal atrial fibrillation) ([place]) 05/02/2018  . Primary osteoarthritis of right knee 06/14/2017  . OSA (obstructive sleep apnea) 02/13/2017  . NIDDM-type 2 10/31/2016  . Essential hypertension 08/02/2016  . Anxiety and depression 08/02/2016  . Insomnia 08/02/2016  . Arthritis of left knee 10/13/2014  . Status post total left knee replacement 10/13/2014    Sumner Boast., PT 12/25/2018, 1:19  PM  [place] [address] [place], [place], [address] Phone: [phone]   Fax:  [phone]  Name: Debra Wright MRN: [id number] Date of Birth: Mar 01, 1951

## 2018-12-26 MED ORDER — METHOCARBAMOL 500 MG PO TABS: 500.0000 mg | ORAL_TABLET | Freq: Four times a day (QID) | ORAL | 0 refills | Status: DC | PRN

## 2018-12-26 NOTE — Telephone Encounter (Signed)
CB pt 

## 2018-12-26 NOTE — Telephone Encounter (Signed)
Refill

## 2018-12-27 ENCOUNTER — Ambulatory Visit: Payer: Medicare Other | Admitting: Physical Therapy

## 2018-12-27 ENCOUNTER — Other Ambulatory Visit: Payer: Self-pay

## 2018-12-27 DIAGNOSIS — M25561 Pain in right knee: Secondary | ICD-10-CM | POA: Diagnosis not present

## 2018-12-27 DIAGNOSIS — R262 Difficulty in walking, not elsewhere classified: Secondary | ICD-10-CM

## 2018-12-27 DIAGNOSIS — R600 Localized edema: Secondary | ICD-10-CM | POA: Diagnosis not present

## 2018-12-27 DIAGNOSIS — M25661 Stiffness of right knee, not elsewhere classified: Secondary | ICD-10-CM | POA: Diagnosis not present

## 2018-12-27 NOTE — Therapy (Signed)
[place], [place], [address] Phone: [phone]   Fax:  [phone]  Physical Therapy Treatment  Patient Details  Name: Debra Wright MRN: [id number] Date of Birth: [birth year] Referring Provider (PT): Ninfa Linden   Encounter Date: 12/27/2018  PT End of Session - 12/27/18 1148    Visit Number  2    Authorization Type  Medicare    PT Start Time  1104    PT Stop Time  1159    PT Time Calculation (min)  55 min    Activity Tolerance  Patient tolerated treatment well;Patient limited by pain    Behavior During Therapy  [facility] for tasks assessed/performed       Past Medical History:  Diagnosis Date  . Anxiety   . Arthritis    "knees; right thumb" (10/14/2014)  . Basal cell carcinoma    "burned off upper lip & right shoulder"  . Chronic anticoagulation    CHADS VASC=3  . Complication of anesthesia    after spinal anesthesia for left knee replacement, bladder did "not wake up". Was incontinent for 4 days post surgery  . Depression   . Fibroid tumor   . Frequent diarrhea   . GERD (gastroesophageal reflux disease)   . High cholesterol   . Hypertension    EF 65-70% grade 2DD  . Insomnia   . Migraine    "frequently when I was younger; maybe monthly now" (10/14/2014) - none after hysterectomy  . PAF (paroxysmal atrial fibrillation) ([place]) 05/02/2018   converted with rate control  . Pernicious anemia    pt not aware of this  . Sleep apnea    uses a cpap  . Type II diabetes mellitus ([place])   . Vitamin B 12 deficiency     Past Surgical History:  Procedure Laterality Date  . ABDOMINAL HYSTERECTOMY  1988  . ACHILLES TENDON SURGERY Right   . APPENDECTOMY  1988  . BILATERAL OOPHORECTOMY  2004  . CARPAL TUNNEL RELEASE Bilateral 1986  . CATARACT EXTRACTION W/ INTRAOCULAR LENS  IMPLANT, BILATERAL Bilateral 2011  . COLONOSCOPY    . JOINT REPLACEMENT    . SHOULDER ARTHROSCOPY DISTAL CLAVICLE EXCISION  AND OPEN ROTATOR CUFF REPAIR Right 11/2013  . TOENAIL EXCISION Right 04/2018   "big toe"  . TONSILLECTOMY  1950's  . TOTAL KNEE ARTHROPLASTY Left 10/13/2014   Procedure: LEFT TOTAL KNEE ARTHROPLASTY;  Surgeon: Mcarthur Rossetti, MD;  Location: [place];  Service: Orthopedics;  Laterality: Left;  . TOTAL KNEE ARTHROPLASTY Right 12/10/2018   Procedure: RIGHT TOTAL KNEE ARTHROPLASTY;  Surgeon: Mcarthur Rossetti, MD;  Location: [place];  Service: Orthopedics;  Laterality: Right;    There were no vitals filed for this visit.  Subjective Assessment - 12/27/18 1107    Subjective  "I am ok, but I woke up in a little more pain"    Currently in Pain?  Yes    Pain Score  7     Pain Location  Knee    Pain Orientation  Right    Pain Descriptors / Indicators  --   stinging                      [facility] Adult PT Treatment/Exercise - 12/27/18 0001      Ambulation/Gait   Gait Comments  uses a 4WW, slow, stiff on the right, antalgic on the right      Knee/Hip Exercises: Aerobic  Nustep  level 2 x  minutes      Knee/Hip Exercises: Standing   Other Standing Knee Exercises  Wt shifts 2x10      Knee/Hip Exercises: Seated   Long Arc Quad  Right;2 sets;10 reps    Other Seated Knee/Hip Exercises  R quad sets x 10     Hamstring Curl  Strengthening;Right;2 sets;10 reps    Hamstring Limitations  red Tband      Knee/Hip Exercises: Supine   Short Arc Quad Sets  Right;1 set;10 reps   Tactilke cues to keep RLE down     Modalities   Modalities  Vasopneumatic      Vasopneumatic   Number Minutes Vasopneumatic   15 minutes    Vasopnuematic Location   Knee    Vasopneumatic Pressure  Medium    Vasopneumatic Temperature   37      Manual Therapy   Manual Therapy  Passive ROM;Joint mobilization    Joint Mobilization  TF Grades 1-2    Passive ROM  R knee flex and ext               PT Short Term Goals - 12/25/18 1226      PT SHORT TERM GOAL #1   Title  Independent with  initial HEP     Time  2    Period  Weeks    Status  New        PT Long Term Goals - 12/25/18 1226      PT LONG TERM GOAL #1   Title  Independent with advanced HEP    Time  8    Period  Weeks    Status  New      PT LONG TERM GOAL #2   Title  increase right knee AROM to 5-115 degrees flexion    Time  8    Period  Weeks    Status  New      PT LONG TERM GOAL #3   Title  walk with least restrictive device 500 feet and minimal deviation    Time  8    Period  Weeks    Status  New      PT LONG TERM GOAL #4   Title  increase strength to 4+/5    Time  8    Period  Weeks    Status  New      PT LONG TERM GOAL #5   Title  Able to walk community distances w/oknee pain    Time  8    Period  Weeks    Status  New            Plan - 12/27/18 1151    Clinical Impression Statement  Pt with swelling and tightness in the R knee that goes down her leg. R knee ROM is very limited. Pain reports with Light PROM. Cues to achieve greater ROM with seated HS curls. Tactile cues to knee R thigh down with long and short arc quads.     Comorbidities  DM, anxiety, depression, past achilles rupture    Examination-Activity Limitations  Bed Mobility;Stairs;Squat;Locomotion Level;Stand;Toileting;Transfers    Examination-Participation Restrictions  Shop;Cleaning;Driving    PT Treatment/Interventions  ADLs/Self Care Home Management;Cryotherapy;Electrical Stimulation;Functional mobility training;Stair training;Gait training;Therapeutic activities;Therapeutic exercise;Balance training;Neuromuscular re-education;Manual techniques;Patient/family education;Vasopneumatic Device    PT Next Visit Plan  get her moving and work on ROM     HEP Seated quad stretch, Plant RLE and slide forward.  Patient will benefit from  skilled therapeutic intervention in order to improve the following deficits and impairments:     Visit Diagnosis: Acute pain of right knee  Stiffness of right knee, not elsewhere  classified  Difficulty in walking, not elsewhere classified  Localized edema     Problem List Patient Active Problem List   Diagnosis Date Noted  . Unilateral primary osteoarthritis, right knee 12/10/2018  . Status post total right knee replacement 12/10/2018  . Long term (current) use of anticoagulants 08/16/2018  . Hypertensive cardiovascular disease 06/04/2018  . Morbid obesity with BMI of 40.0-44.9, adult ([place]) 06/04/2018  . Fatigue 06/04/2018  . Chronic anticoagulation   . Atrial fibrillation and flutter ([place]) 05/02/2018  . Atrial fibrillation with RVR ([place]) 05/02/2018  . PAF (paroxysmal atrial fibrillation) ([place]) 05/02/2018  . Primary osteoarthritis of right knee 06/14/2017  . OSA (obstructive sleep apnea) 02/13/2017  . NIDDM-type 2 10/31/2016  . Essential hypertension 08/02/2016  . Anxiety and depression 08/02/2016  . Insomnia 08/02/2016  . Arthritis of left knee 10/13/2014  . Status post total left knee replacement 10/13/2014    Scot Jun 12/27/2018, 11:56 AM  [place], [place], [address] Phone: [phone]   Fax:  [phone]  Name: Colisha Redler MRN: [id number] Date of Birth: Feb 15, 1951

## 2018-12-30 ENCOUNTER — Other Ambulatory Visit (INDEPENDENT_AMBULATORY_CARE_PROVIDER_SITE_OTHER): Payer: Self-pay

## 2018-12-30 ENCOUNTER — Ambulatory Visit: Payer: Medicare Other | Admitting: Physical Therapy

## 2018-12-30 ENCOUNTER — Encounter: Payer: Self-pay | Admitting: Physical Therapy

## 2018-12-30 ENCOUNTER — Other Ambulatory Visit: Payer: Self-pay

## 2018-12-30 DIAGNOSIS — M25661 Stiffness of right knee, not elsewhere classified: Secondary | ICD-10-CM

## 2018-12-30 DIAGNOSIS — R600 Localized edema: Secondary | ICD-10-CM

## 2018-12-30 DIAGNOSIS — M25561 Pain in right knee: Secondary | ICD-10-CM

## 2018-12-30 DIAGNOSIS — R262 Difficulty in walking, not elsewhere classified: Secondary | ICD-10-CM | POA: Diagnosis not present

## 2018-12-30 MED ORDER — OXYCODONE HCL 5 MG PO TABS: 5.0000 mg | ORAL_TABLET | ORAL | 0 refills | Status: DC | PRN

## 2018-12-30 NOTE — Therapy (Signed)
[place], [place], [address] Phone: [phone]   Fax:  [phone]  Physical Therapy Treatment  Patient Details  Name: Debra Wright MRN: [id number] Date of Birth: 1951/07/23 Referring Provider (PT): Ninfa Linden   Encounter Date: 12/30/2018  PT End of Session - 12/30/18 1234    Visit Number  3    Date for PT Re-Evaluation  02/24/19    Authorization Type  Medicare    PT Start Time  0938    PT Stop Time  1238    PT Time Calculation (min)  53 min    Activity Tolerance  Patient tolerated treatment well;Patient limited by pain    Behavior During Therapy  [facility] for tasks assessed/performed       Past Medical History:  Diagnosis Date  . Anxiety   . Arthritis    "knees; right thumb" (10/14/2014)  . Basal cell carcinoma    "burned off upper lip & right shoulder"  . Chronic anticoagulation    CHADS VASC=3  . Complication of anesthesia    after spinal anesthesia for left knee replacement, bladder did "not wake up". Was incontinent for 4 days post surgery  . Depression   . Fibroid tumor   . Frequent diarrhea   . GERD (gastroesophageal reflux disease)   . High cholesterol   . Hypertension    EF 65-70% grade 2DD  . Insomnia   . Migraine    "frequently when I was younger; maybe monthly now" (10/14/2014) - none after hysterectomy  . PAF (paroxysmal atrial fibrillation) ([place]) 05/02/2018   converted with rate control  . Pernicious anemia    pt not aware of this  . Sleep apnea    uses a cpap  . Type II diabetes mellitus ([place])   . Vitamin B 12 deficiency     Past Surgical History:  Procedure Laterality Date  . ABDOMINAL HYSTERECTOMY  1988  . ACHILLES TENDON SURGERY Right   . APPENDECTOMY  1988  . BILATERAL OOPHORECTOMY  2004  . CARPAL TUNNEL RELEASE Bilateral 1986  . CATARACT EXTRACTION W/ INTRAOCULAR LENS  IMPLANT, BILATERAL Bilateral 2011  . COLONOSCOPY    . JOINT REPLACEMENT    . SHOULDER  ARTHROSCOPY DISTAL CLAVICLE EXCISION AND OPEN ROTATOR CUFF REPAIR Right 11/2013  . TOENAIL EXCISION Right 04/2018   "big toe"  . TONSILLECTOMY  1950's  . TOTAL KNEE ARTHROPLASTY Left 10/13/2014   Procedure: LEFT TOTAL KNEE ARTHROPLASTY;  Surgeon: Mcarthur Rossetti, MD;  Location: [place];  Service: Orthopedics;  Laterality: Left;  . TOTAL KNEE ARTHROPLASTY Right 12/10/2018   Procedure: RIGHT TOTAL KNEE ARTHROPLASTY;  Surgeon: Mcarthur Rossetti, MD;  Location: [place];  Service: Orthopedics;  Laterality: Right;    There were no vitals filed for this visit.  Subjective Assessment - 12/30/18 1150    Subjective  "Pretty good"    Currently in Pain?  Yes    Pain Score  5     Pain Location  Knee    Pain Orientation  Right                       [facility] Adult PT Treatment/Exercise - 12/30/18 0001      Knee/Hip Exercises: Aerobic   Nustep  L4 x 6 min       Knee/Hip Exercises: Machines for Strengthening   Cybex Leg Press  20lb 2x10 position 7 little ROM, No weight RLE for a  stretch       Knee/Hip Exercises: Seated   Long Arc Quad  Right;2 sets;10 reps    Hamstring Curl  Strengthening;Right;2 sets;10 reps    Hamstring Limitations  red Tband      Modalities   Modalities  Cryotherapy      Cryotherapy   Cryotherapy Location  Knee    Type of Cryotherapy  Ice pack      Manual Therapy   Manual Therapy  Passive ROM;Joint mobilization    Joint Mobilization  TF Grades 1-2    Passive ROM  R knee flex and ext               PT Short Term Goals - 12/30/18 1241      PT SHORT TERM GOAL #1   Title  Independent with initial HEP     Status  Achieved        PT Long Term Goals - 12/25/18 1226      PT LONG TERM GOAL #1   Title  Independent with advanced HEP    Time  8    Period  Weeks    Status  New      PT LONG TERM GOAL #2   Title  increase right knee AROM to 5-115 degrees flexion    Time  8    Period  Weeks    Status  New      PT LONG TERM GOAL #3    Title  walk with least restrictive device 500 feet and minimal deviation    Time  8    Period  Weeks    Status  New      PT LONG TERM GOAL #4   Title  increase strength to 4+/5    Time  8    Period  Weeks    Status  New      PT LONG TERM GOAL #5   Title  Able to walk community distances w/oknee pain    Time  8    Period  Weeks    Status  New            Plan - 12/30/18 1235    Clinical Impression Statement  Tightness and swelling in the R knee remains. Tightness noted with PROM. She does all the exercises well with her available ROM. Ambulates stiff legged. Pain reported with MT at end range.    Comorbidities  DM, anxiety, depression, past achiiles rupture    Examination-Activity Limitations  Bed Mobility;Stairs;Squat;Locomotion Level;Stand;Toileting;Transfers    Examination-Participation Restrictions  Shop;Cleaning;Driving    Rehab Potential  Good    PT Frequency  3x / week    PT Duration  8 weeks    PT Treatment/Interventions  ADLs/Self Care Home Management;Cryotherapy;Electrical Stimulation;Functional mobility training;Stair training;Gait training;Therapeutic activities;Therapeutic exercise;Balance training;Neuromuscular re-education;Manual techniques;Patient/family education;Vasopneumatic Device    PT Next Visit Plan  get her moving and work on ROM       Patient will benefit from skilled therapeutic intervention in order to improve the following deficits and impairments:     Visit Diagnosis: Stiffness of right knee, not elsewhere classified  Difficulty in walking, not elsewhere classified  Localized edema  Acute pain of right knee     Problem List Patient Active Problem List   Diagnosis Date Noted  . Unilateral primary osteoarthritis, right knee 12/10/2018  . Status post total right knee replacement 12/10/2018  . Long term (current) use of anticoagulants 08/16/2018  . Hypertensive cardiovascular disease 06/04/2018  . Morbid  obesity with BMI of 40.0-44.9,  adult ([place]) 06/04/2018  . Fatigue 06/04/2018  . Chronic anticoagulation   . Atrial fibrillation and flutter ([place]) 05/02/2018  . Atrial fibrillation with RVR ([place]) 05/02/2018  . PAF (paroxysmal atrial fibrillation) ([place]) 05/02/2018  . Primary osteoarthritis of right knee 06/14/2017  . OSA (obstructive sleep apnea) 02/13/2017  . NIDDM-type 2 10/31/2016  . Essential hypertension 08/02/2016  . Anxiety and depression 08/02/2016  . Insomnia 08/02/2016  . Arthritis of left knee 10/13/2014  . Status post total left knee replacement 10/13/2014    Scot Jun, PTA 12/30/2018, 12:41 PM  [place], [place], [address] Phone: [phone]   Fax:  [phone]  Name: Debra Wright MRN: [id number] Date of Birth: January 24, 1951

## 2018-12-30 NOTE — Telephone Encounter (Signed)
CB/GC pt

## 2018-12-30 NOTE — Telephone Encounter (Signed)
Please advise 

## 2018-12-31 ENCOUNTER — Telehealth: Payer: Self-pay

## 2018-12-31 NOTE — Telephone Encounter (Signed)

## 2019-01-01 ENCOUNTER — Ambulatory Visit: Payer: Medicare Other | Attending: Orthopaedic Surgery | Admitting: Physical Therapy

## 2019-01-01 ENCOUNTER — Ambulatory Visit (INDEPENDENT_AMBULATORY_CARE_PROVIDER_SITE_OTHER): Payer: Medicare Other | Admitting: *Deleted

## 2019-01-01 ENCOUNTER — Other Ambulatory Visit: Payer: Self-pay

## 2019-01-01 ENCOUNTER — Encounter: Payer: Self-pay | Admitting: Physical Therapy

## 2019-01-01 DIAGNOSIS — R262 Difficulty in walking, not elsewhere classified: Secondary | ICD-10-CM

## 2019-01-01 DIAGNOSIS — M25561 Pain in right knee: Secondary | ICD-10-CM | POA: Diagnosis not present

## 2019-01-01 DIAGNOSIS — Z7901 Long term (current) use of anticoagulants: Secondary | ICD-10-CM | POA: Diagnosis not present

## 2019-01-01 DIAGNOSIS — M25611 Stiffness of right shoulder, not elsewhere classified: Secondary | ICD-10-CM | POA: Insufficient documentation

## 2019-01-01 DIAGNOSIS — M25511 Pain in right shoulder: Secondary | ICD-10-CM | POA: Diagnosis not present

## 2019-01-01 DIAGNOSIS — R600 Localized edema: Secondary | ICD-10-CM | POA: Diagnosis not present

## 2019-01-01 DIAGNOSIS — I4891 Unspecified atrial fibrillation: Secondary | ICD-10-CM | POA: Diagnosis not present

## 2019-01-01 DIAGNOSIS — M25661 Stiffness of right knee, not elsewhere classified: Secondary | ICD-10-CM | POA: Diagnosis not present

## 2019-01-01 LAB — POCT INR: INR: 2.1 (ref 2.0–3.0)

## 2019-01-01 NOTE — Therapy (Signed)
[place], [place], [address] Phone: [phone]   Fax:  [phone]  Physical Therapy Treatment  Patient Details  Name: Debra Wright MRN: [id number] Date of Birth: 23-Dec-1950 Referring Provider (PT): Hedy Camara Date: 01/01/2019  PT End of Session - 01/01/19 1138    Visit Number  4    Date for PT Re-Evaluation  02/24/19    Authorization Type  Medicare    PT Start Time  1058    PT Stop Time  1148    PT Time Calculation (min)  50 min       Past Medical History:  Diagnosis Date  . Anxiety   . Arthritis    "knees; right thumb" (10/14/2014)  . Basal cell carcinoma    "burned off upper lip & right shoulder"  . Chronic anticoagulation    CHADS VASC=3  . Complication of anesthesia    after spinal anesthesia for left knee replacement, bladder did "not wake up". Was incontinent for 4 days post surgery  . Depression   . Fibroid tumor   . Frequent diarrhea   . GERD (gastroesophageal reflux disease)   . High cholesterol   . Hypertension    EF 65-70% grade 2DD  . Insomnia   . Migraine    "frequently when I was younger; maybe monthly now" (10/14/2014) - none after hysterectomy  . PAF (paroxysmal atrial fibrillation) ([place]) 05/02/2018   converted with rate control  . Pernicious anemia    pt not aware of this  . Sleep apnea    uses a cpap  . Type II diabetes mellitus ([place])   . Vitamin B 12 deficiency     Past Surgical History:  Procedure Laterality Date  . ABDOMINAL HYSTERECTOMY  1988  . ACHILLES TENDON SURGERY Right   . APPENDECTOMY  1988  . BILATERAL OOPHORECTOMY  2004  . CARPAL TUNNEL RELEASE Bilateral 1986  . CATARACT EXTRACTION W/ INTRAOCULAR LENS  IMPLANT, BILATERAL Bilateral 2011  . COLONOSCOPY    . JOINT REPLACEMENT    . SHOULDER ARTHROSCOPY DISTAL CLAVICLE EXCISION AND OPEN ROTATOR CUFF REPAIR Right 11/2013  . TOENAIL EXCISION Right 04/2018   "big toe"  .  TONSILLECTOMY  1950's  . TOTAL KNEE ARTHROPLASTY Left 10/13/2014   Procedure: LEFT TOTAL KNEE ARTHROPLASTY;  Surgeon: Mcarthur Rossetti, MD;  Location: [place];  Service: Orthopedics;  Laterality: Left;  . TOTAL KNEE ARTHROPLASTY Right 12/10/2018   Procedure: RIGHT TOTAL KNEE ARTHROPLASTY;  Surgeon: Mcarthur Rossetti, MD;  Location: [place];  Service: Orthopedics;  Laterality: Right;    There were no vitals filed for this visit.  Subjective Assessment - 01/01/19 1102    Subjective  Pt stated that she may have over done her exercises yesterday, stated that she is sore    Currently in Pain?  Yes    Pain Score  5     Pain Location  Knee    Pain Orientation  Right                       [facility] Adult PT Treatment/Exercise - 01/01/19 0001      Knee/Hip Exercises: Aerobic   Nustep  L4 x 6 min       Knee/Hip Exercises: Machines for Strengthening   Cybex Knee Extension  5lb 2x10, cues to keep RLE in touch of pad    Cybex Knee Flexion  20lb 2x10  Cybex Leg Press  20lb 2x10 position 7 little ROM      Modalities   Modalities  Cryotherapy      Cryotherapy   Number Minutes Cryotherapy  112 Minutes    Cryotherapy Location  Knee    Type of Cryotherapy  Ice pack      Manual Therapy   Manual Therapy  Passive ROM;Joint mobilization    Joint Mobilization  TF Grades 1-2    Passive ROM  R knee flex and ext               PT Short Term Goals - 01/01/19 1142      PT SHORT TERM GOAL #1   Title  Independent with initial HEP     Status  Achieved        PT Long Term Goals - 01/01/19 1142      PT LONG TERM GOAL #1   Title  Independent with advanced HEP    Status  Partially Met      PT LONG TERM GOAL #2   Title  increase right knee AROM to 5-115 degrees flexion    Status  On-going      PT LONG TERM GOAL #3   Title  walk with least restrictive device 500 feet and minimal deviation    Status  On-going      PT LONG TERM GOAL #4   Title  increase strength  to 4+/5    Status  On-going      PT LONG TERM GOAL #5   Title  Able to walk community distances w/oknee pain    Status  On-going            Plan - 01/01/19 1139    Clinical Impression Statement  Pt enters clinic reporting increase soreness on her knee cap. She stated it was from over doing her HEP. R knee remains tight and stiff limiting ROM. She was able to complete all of the exercises with limited ROM. Cues to keep RLE in contact with pad during leg extensions. Guarded with MT needing frequent cues for her to relax. Pain repots at end range of passive flexion and extension.     Comorbidities  DM, anxiety, depression, past achiiles rupture    Examination-Activity Limitations  Bed Mobility;Stairs;Squat;Locomotion Level;Stand;Toileting;Transfers    Examination-Participation Restrictions  Shop;Cleaning;Driving    Stability/Clinical Decision Making  Evolving/Moderate complexity    Rehab Potential  Good    PT Frequency  3x / week    PT Duration  8 weeks    PT Treatment/Interventions  ADLs/Self Care Home Management;Cryotherapy;Electrical Stimulation;Functional mobility training;Stair training;Gait training;Therapeutic activities;Therapeutic exercise;Balance training;Neuromuscular re-education;Manual techniques;Patient/family education;Vasopneumatic Device    PT Next Visit Plan  R knee ROM and strength.        Patient will benefit from skilled therapeutic intervention in order to improve the following deficits and impairments:  Abnormal gait, Cardiopulmonary status limiting activity, Decreased activity tolerance, Decreased balance, Difficulty walking, Increased edema, Impaired flexibility, Decreased strength, Decreased range of motion, Decreased endurance, Decreased mobility, Decreased scar mobility, Pain  Visit Diagnosis: Stiffness of right knee, not elsewhere classified  Difficulty in walking, not elsewhere classified  Localized edema  Acute pain of right knee     Problem  List Patient Active Problem List   Diagnosis Date Noted  . Unilateral primary osteoarthritis, right knee 12/10/2018  . Status post total right knee replacement 12/10/2018  . Long term (current) use of anticoagulants 08/16/2018  . Hypertensive cardiovascular disease 06/04/2018  .  Morbid obesity with BMI of 40.0-44.9, adult ([place]) 06/04/2018  . Fatigue 06/04/2018  . Chronic anticoagulation   . Atrial fibrillation and flutter ([place]) 05/02/2018  . Atrial fibrillation with RVR ([place]) 05/02/2018  . PAF (paroxysmal atrial fibrillation) ([place]) 05/02/2018  . Primary osteoarthritis of right knee 06/14/2017  . OSA (obstructive sleep apnea) 02/13/2017  . NIDDM-type 2 10/31/2016  . Essential hypertension 08/02/2016  . Anxiety and depression 08/02/2016  . Insomnia 08/02/2016  . Arthritis of left knee 10/13/2014  . Status post total left knee replacement 10/13/2014    Scot Jun, PTA 01/01/2019, 11:43 AM  [place], [place], [address] Phone: [phone]   Fax:  [phone]  Name: Jarika Robben MRN: [id number] Date of Birth: 04-10-1951

## 2019-01-01 NOTE — Patient Instructions (Signed)
Description   Spoke with pt and instructed pt to continue taking 1 tablet daily except 1/2 tablets each Monday, Wednesday and Friday.  Repeat INR in 4 weeks.

## 2019-01-02 ENCOUNTER — Telehealth: Payer: Self-pay | Admitting: Pharmacist

## 2019-01-02 MED ORDER — APIXABAN 5 MG PO TABS: 5.0000 mg | ORAL_TABLET | Freq: Two times a day (BID) | ORAL | 5 refills | Status: DC

## 2019-01-02 NOTE — Telephone Encounter (Addendum)
Sent in rx for Eliquis to pharmacy to assess copay - likely remains cost prohibitive due to deductible - 1st month fill would cost $251. Advised pharmacy to place rx on hold. Called pt who states that Eliquis would be unaffordable and she would like to remain on warfarin at this time.

## 2019-01-02 NOTE — Addendum Note (Signed)
Addended by: SUPPLE, MEGAN E on: 01/02/2019 10:17 AM   Modules accepted: Orders

## 2019-01-02 NOTE — Telephone Encounter (Signed)
-----   Message from Marcos Eke, RN sent at 01/01/2019  4:43 PM EDT ----- Marykay Lex! Spoke with Sadie Haber 03/23/1951 regarding INR. She used to be on Eliquis but cost was super expensive at $300-400 and she states if the cost was extremely lowered or if she received some good assistance to make it affordable she would switch back. Advised I would update PharmD to assess. Pt has an appt for INR f/u in 4 weeks.

## 2019-01-03 ENCOUNTER — Ambulatory Visit: Payer: Medicare Other | Admitting: Physical Therapy

## 2019-01-06 ENCOUNTER — Encounter (INDEPENDENT_AMBULATORY_CARE_PROVIDER_SITE_OTHER): Payer: Self-pay | Admitting: Family Medicine

## 2019-01-06 ENCOUNTER — Other Ambulatory Visit (INDEPENDENT_AMBULATORY_CARE_PROVIDER_SITE_OTHER): Payer: Self-pay | Admitting: Family Medicine

## 2019-01-06 ENCOUNTER — Other Ambulatory Visit (INDEPENDENT_AMBULATORY_CARE_PROVIDER_SITE_OTHER): Payer: Self-pay | Admitting: Orthopaedic Surgery

## 2019-01-06 ENCOUNTER — Other Ambulatory Visit: Payer: Self-pay | Admitting: Cardiovascular Disease

## 2019-01-06 DIAGNOSIS — F419 Anxiety disorder, unspecified: Principal | ICD-10-CM

## 2019-01-06 DIAGNOSIS — Z760 Encounter for issue of repeat prescription: Secondary | ICD-10-CM

## 2019-01-06 DIAGNOSIS — F32A Depression, unspecified: Secondary | ICD-10-CM

## 2019-01-06 DIAGNOSIS — F329 Major depressive disorder, single episode, unspecified: Secondary | ICD-10-CM

## 2019-01-07 ENCOUNTER — Encounter: Payer: Self-pay | Admitting: Physical Therapy

## 2019-01-07 ENCOUNTER — Ambulatory Visit: Payer: Medicare Other | Admitting: Physical Therapy

## 2019-01-07 ENCOUNTER — Other Ambulatory Visit: Payer: Self-pay

## 2019-01-07 DIAGNOSIS — R262 Difficulty in walking, not elsewhere classified: Secondary | ICD-10-CM

## 2019-01-07 DIAGNOSIS — R600 Localized edema: Secondary | ICD-10-CM | POA: Diagnosis not present

## 2019-01-07 DIAGNOSIS — M25561 Pain in right knee: Secondary | ICD-10-CM

## 2019-01-07 DIAGNOSIS — M25611 Stiffness of right shoulder, not elsewhere classified: Secondary | ICD-10-CM | POA: Diagnosis not present

## 2019-01-07 DIAGNOSIS — M25511 Pain in right shoulder: Secondary | ICD-10-CM | POA: Diagnosis not present

## 2019-01-07 DIAGNOSIS — M25661 Stiffness of right knee, not elsewhere classified: Secondary | ICD-10-CM

## 2019-01-07 MED ORDER — DULOXETINE HCL 30 MG PO CPEP: 30.0000 mg | ORAL_CAPSULE | Freq: Every day | ORAL | 0 refills | Status: DC

## 2019-01-07 MED ORDER — CLONAZEPAM 0.5 MG PO TABS: ORAL_TABLET | ORAL | 0 refills | Status: DC

## 2019-01-07 MED ORDER — CITALOPRAM HYDROBROMIDE 40 MG PO TABS: 40.0000 mg | ORAL_TABLET | Freq: Every day | ORAL | 2 refills | Status: DC

## 2019-01-07 MED ORDER — METHOCARBAMOL 500 MG PO TABS: 500.0000 mg | ORAL_TABLET | Freq: Four times a day (QID) | ORAL | 0 refills | Status: DC | PRN

## 2019-01-07 NOTE — Therapy (Signed)
[place], [place], [address] Phone: [phone]   Fax:  [phone]  Physical Therapy Treatment  Patient Details  Name: Debra Wright MRN: [id number] Date of Birth: Aug 06, 1951 Referring Provider (PT): Ninfa Linden   Encounter Date: 01/07/2019  PT End of Session - 01/07/19 1428    Visit Number  5    Date for PT Re-Evaluation  02/24/19    Authorization Type  Medicare    PT Start Time  3532    PT Stop Time  1443    PT Time Calculation (min)  58 min    Activity Tolerance  Patient tolerated treatment well    Behavior During Therapy  [facility] for tasks assessed/performed       Past Medical History:  Diagnosis Date  . Anxiety   . Arthritis    "knees; right thumb" (10/14/2014)  . Basal cell carcinoma    "burned off upper lip & right shoulder"  . Chronic anticoagulation    CHADS VASC=3  . Complication of anesthesia    after spinal anesthesia for left knee replacement, bladder did "not wake up". Was incontinent for 4 days post surgery  . Depression   . Fibroid tumor   . Frequent diarrhea   . GERD (gastroesophageal reflux disease)   . High cholesterol   . Hypertension    EF 65-70% grade 2DD  . Insomnia   . Migraine    "frequently when I was younger; maybe monthly now" (10/14/2014) - none after hysterectomy  . PAF (paroxysmal atrial fibrillation) ([place]) 05/02/2018   converted with rate control  . Pernicious anemia    pt not aware of this  . Sleep apnea    uses a cpap  . Type II diabetes mellitus ([place])   . Vitamin B 12 deficiency     Past Surgical History:  Procedure Laterality Date  . ABDOMINAL HYSTERECTOMY  1988  . ACHILLES TENDON SURGERY Right   . APPENDECTOMY  1988  . BILATERAL OOPHORECTOMY  2004  . CARPAL TUNNEL RELEASE Bilateral 1986  . CATARACT EXTRACTION W/ INTRAOCULAR LENS  IMPLANT, BILATERAL Bilateral 2011  . COLONOSCOPY    . JOINT REPLACEMENT    . SHOULDER ARTHROSCOPY DISTAL  CLAVICLE EXCISION AND OPEN ROTATOR CUFF REPAIR Right 11/2013  . TOENAIL EXCISION Right 04/2018   "big toe"  . TONSILLECTOMY  1950's  . TOTAL KNEE ARTHROPLASTY Left 10/13/2014   Procedure: LEFT TOTAL KNEE ARTHROPLASTY;  Surgeon: Mcarthur Rossetti, MD;  Location: [place];  Service: Orthopedics;  Laterality: Left;  . TOTAL KNEE ARTHROPLASTY Right 12/10/2018   Procedure: RIGHT TOTAL KNEE ARTHROPLASTY;  Surgeon: Mcarthur Rossetti, MD;  Location: [place];  Service: Orthopedics;  Laterality: Right;    There were no vitals filed for this visit.  Subjective Assessment - 01/07/19 1350    Subjective  "I worked on it all weekend"    Currently in Pain?  Yes    Pain Score  3     Pain Location  Knee    Pain Orientation  Right         [facility] PT Assessment - 01/07/19 0001      AROM   Right Knee Extension  12    Right Knee Flexion  82                   [facility] Adult PT Treatment/Exercise - 01/07/19 0001      Knee/Hip Exercises: Aerobic   Nustep  L3 x 6 min       Knee/Hip Exercises: Machines for Strengthening   Cybex Knee Extension  5lb 2x10, cues to keep RLE in touch of pad, RLE 5lb x5    Cybex Knee Flexion  20lb 2x10    Cybex Leg Press  20lb 2x10 position 6, RLE no weight x10, RLE TKE 20lb x10       Knee/Hip Exercises: Seated   Long Arc Quad  Right;2 sets;10 reps    Long Arc Quad Weight  2 lbs.      Vasopneumatic   Number Minutes Vasopneumatic   15 minutes    Vasopnuematic Location   Knee    Vasopneumatic Pressure  Medium    Vasopneumatic Temperature   37      Manual Therapy   Manual Therapy  Passive ROM;Joint mobilization;Muscle Energy Technique    Joint Mobilization  TF Grades 1-2    Passive ROM  R knee flex and ext    Muscle Energy Technique  R knee contract relax to increase flexion               PT Short Term Goals - 01/01/19 1142      PT SHORT TERM GOAL #1   Title  Independent with initial HEP     Status  Achieved        PT Long Term Goals -  01/07/19 1430      PT LONG TERM GOAL #2   Title  increase right knee AROM to 5-115 degrees flexion    Status  On-going      PT LONG TERM GOAL #3   Title  walk with least restrictive device 500 feet and minimal deviation    Status  On-going      PT LONG TERM GOAL #4   Title  increase strength to 4+/5      PT LONG TERM GOAL #5   Title  Able to walk community distances w/oknee pain    Status  On-going            Plan - 01/07/19 1430    Clinical Impression Statement  Pt with a progression in R knee AROM. AROM taken after MT. Able to achieve better flexion ROM with contract relax but unable to tolerate due to pain.  She was able to complete all of today's exercises, but reports pain with flexion. Leg press was performed at a lower setting. Cues to hold flexion stretch with no weight on leg press. Pain reported with MT at end range.    Comorbidities  DM, anxiety, depression, past achiiles rupture    Examination-Activity Limitations  Bed Mobility;Stairs;Squat;Locomotion Level;Stand;Toileting;Transfers    Examination-Participation Restrictions  Shop;Cleaning;Driving    Stability/Clinical Decision Making  Evolving/Moderate complexity    Rehab Potential  Good    PT Frequency  3x / week    PT Duration  8 weeks    PT Treatment/Interventions  ADLs/Self Care Home Management;Cryotherapy;Electrical Stimulation;Functional mobility training;Stair training;Gait training;Therapeutic activities;Therapeutic exercise;Balance training;Neuromuscular re-education;Manual techniques;Patient/family education;Vasopneumatic Device    PT Next Visit Plan  R knee ROM and strenght.        Patient will benefit from skilled therapeutic intervention in order to improve the following deficits and impairments:     Visit Diagnosis: Stiffness of right knee, not elsewhere classified  Localized edema  Acute pain of right knee  Difficulty in walking, not elsewhere classified     Problem List Patient Active  Problem List   Diagnosis Date Noted  . Unilateral  primary osteoarthritis, right knee 12/10/2018  . Status post total right knee replacement 12/10/2018  . Long term (current) use of anticoagulants 08/16/2018  . Hypertensive cardiovascular disease 06/04/2018  . Morbid obesity with BMI of 40.0-44.9, adult ([place]) 06/04/2018  . Fatigue 06/04/2018  . Chronic anticoagulation   . Atrial fibrillation and flutter ([place]) 05/02/2018  . Atrial fibrillation with RVR ([place]) 05/02/2018  . PAF (paroxysmal atrial fibrillation) ([place]) 05/02/2018  . Primary osteoarthritis of right knee 06/14/2017  . OSA (obstructive sleep apnea) 02/13/2017  . NIDDM-type 2 10/31/2016  . Essential hypertension 08/02/2016  . Anxiety and depression 08/02/2016  . Insomnia 08/02/2016  . Arthritis of left knee 10/13/2014  . Status post total left knee replacement 10/13/2014    Scot Jun, PTA 01/07/2019, 2:37 PM  [place] [address] [address] [place], [place], [address] Phone: [phone]   Fax:  [phone]  Name: Thresia Ramanathan MRN: [id number] Date of Birth: 1951/05/05

## 2019-01-07 NOTE — Telephone Encounter (Signed)
Please advise 

## 2019-01-09 ENCOUNTER — Encounter: Payer: Self-pay | Admitting: Physical Therapy

## 2019-01-09 ENCOUNTER — Other Ambulatory Visit: Payer: Self-pay

## 2019-01-09 ENCOUNTER — Ambulatory Visit: Payer: Medicare Other | Admitting: Physical Therapy

## 2019-01-09 DIAGNOSIS — M25511 Pain in right shoulder: Secondary | ICD-10-CM | POA: Diagnosis not present

## 2019-01-09 DIAGNOSIS — R600 Localized edema: Secondary | ICD-10-CM | POA: Diagnosis not present

## 2019-01-09 DIAGNOSIS — M25561 Pain in right knee: Secondary | ICD-10-CM

## 2019-01-09 DIAGNOSIS — M25661 Stiffness of right knee, not elsewhere classified: Secondary | ICD-10-CM

## 2019-01-09 DIAGNOSIS — R262 Difficulty in walking, not elsewhere classified: Secondary | ICD-10-CM | POA: Diagnosis not present

## 2019-01-09 DIAGNOSIS — M25611 Stiffness of right shoulder, not elsewhere classified: Secondary | ICD-10-CM | POA: Diagnosis not present

## 2019-01-09 NOTE — Therapy (Signed)
[place], [place], [address] Phone: [phone]   Fax:  [phone]  Physical Therapy Treatment  Patient Details  Name: Debra Wright MRN: [id number] Date of Birth: Feb 02, 1951 Referring Provider (PT): Ninfa Linden   Encounter Date: 01/09/2019  PT End of Session - 01/09/19 1229    Visit Number  6    Date for PT Re-Evaluation  02/24/19    Authorization Type  Medicare    PT Start Time  8832    PT Stop Time  1244    PT Time Calculation (min)  59 min    Activity Tolerance  Patient tolerated treatment well    Behavior During Therapy  [facility] for tasks assessed/performed       Past Medical History:  Diagnosis Date  . Anxiety   . Arthritis    "knees; right thumb" (10/14/2014)  . Basal cell carcinoma    "burned off upper lip & right shoulder"  . Chronic anticoagulation    CHADS VASC=3  . Complication of anesthesia    after spinal anesthesia for left knee replacement, bladder did "not wake up". Was incontinent for 4 days post surgery  . Depression   . Fibroid tumor   . Frequent diarrhea   . GERD (gastroesophageal reflux disease)   . High cholesterol   . Hypertension    EF 65-70% grade 2DD  . Insomnia   . Migraine    "frequently when I was younger; maybe monthly now" (10/14/2014) - none after hysterectomy  . PAF (paroxysmal atrial fibrillation) ([place]) 05/02/2018   converted with rate control  . Pernicious anemia    pt not aware of this  . Sleep apnea    uses a cpap  . Type II diabetes mellitus ([place])   . Vitamin B 12 deficiency     Past Surgical History:  Procedure Laterality Date  . ABDOMINAL HYSTERECTOMY  1988  . ACHILLES TENDON SURGERY Right   . APPENDECTOMY  1988  . BILATERAL OOPHORECTOMY  2004  . CARPAL TUNNEL RELEASE Bilateral 1986  . CATARACT EXTRACTION W/ INTRAOCULAR LENS  IMPLANT, BILATERAL Bilateral 2011  . COLONOSCOPY    . JOINT REPLACEMENT    . SHOULDER ARTHROSCOPY DISTAL  CLAVICLE EXCISION AND OPEN ROTATOR CUFF REPAIR Right 11/2013  . TOENAIL EXCISION Right 04/2018   "big toe"  . TONSILLECTOMY  1950's  . TOTAL KNEE ARTHROPLASTY Left 10/13/2014   Procedure: LEFT TOTAL KNEE ARTHROPLASTY;  Surgeon: Mcarthur Rossetti, MD;  Location: [place];  Service: Orthopedics;  Laterality: Left;  . TOTAL KNEE ARTHROPLASTY Right 12/10/2018   Procedure: RIGHT TOTAL KNEE ARTHROPLASTY;  Surgeon: Mcarthur Rossetti, MD;  Location: [place];  Service: Orthopedics;  Laterality: Right;    There were no vitals filed for this visit.  Subjective Assessment - 01/09/19 1149    Subjective  "I am good"    Currently in Pain?  No/denies                       [facility] Adult PT Treatment/Exercise - 01/09/19 0001      Knee/Hip Exercises: Aerobic   Nustep  L3 x 6 min       Knee/Hip Exercises: Machines for Strengthening   Cybex Knee Extension  5lb 2x10,  RLE 5lb x10, x5    Cybex Knee Flexion  20lb 2x10    Cybex Leg Press  30lb 2x10 progressive lowering, RLE no weight x10, RLE TKE 20lb x10  Knee/Hip Exercises: Seated   Long Arc Quad  Right;2 sets;10 reps    Long Arc Quad Weight  2 lbs.    Sit to [place]  2 sets;10 reps;with UE support      Vasopneumatic   Number Minutes Vasopneumatic   15 minutes    Vasopnuematic Location   Knee    Vasopneumatic Pressure  Medium    Vasopneumatic Temperature   37               PT Short Term Goals - 01/01/19 1142      PT SHORT TERM GOAL #1   Title  Independent with initial HEP     Status  Achieved        PT Long Term Goals - 01/07/19 1430      PT LONG TERM GOAL #2   Title  increase right knee AROM to 5-115 degrees flexion    Status  On-going      PT LONG TERM GOAL #3   Title  walk with least restrictive device 500 feet and minimal deviation    Status  On-going      PT LONG TERM GOAL #4   Title  increase strength to 4+/5      PT LONG TERM GOAL #5   Title  Able to walk community distances w/oknee pain     Status  On-going            Plan - 01/09/19 1234    Clinical Impression Statement  Pt R knee ROM has progressed with exercises. Increase weight and ROM noted on leg press. She has good quad contraction when extending her R knee but lack full TKE. Did a little stretching on leg extensions using the mechanical advantage, holding 5 seconds between sets.  Cues to try an fully extend RLE with sit to stands.     Comorbidities  DM, anxiety, depression, past achiiles rupture    Examination-Activity Limitations  Bed Mobility;Stairs;Squat;Locomotion Level;Stand;Toileting;Transfers    Examination-Participation Restrictions  Shop;Cleaning;Driving    Stability/Clinical Decision Making  Evolving/Moderate complexity    Rehab Potential  Good    PT Frequency  3x / week    PT Duration  8 weeks    PT Treatment/Interventions  ADLs/Self Care Home Management;Cryotherapy;Electrical Stimulation;Functional mobility training;Stair training;Gait training;Therapeutic activities;Therapeutic exercise;Balance training;Neuromuscular re-education;Manual techniques;Patient/family education;Vasopneumatic Device    PT Next Visit Plan  R knee ROM and strenght.        Patient will benefit from skilled therapeutic intervention in order to improve the following deficits and impairments:  Abnormal gait, Cardiopulmonary status limiting activity, Decreased activity tolerance, Decreased balance, Difficulty walking, Increased edema, Impaired flexibility, Decreased strength, Decreased range of motion, Decreased endurance, Decreased mobility, Decreased scar mobility, Pain  Visit Diagnosis: Stiffness of right knee, not elsewhere classified  Localized edema  Acute pain of right knee  Difficulty in walking, not elsewhere classified     Problem List Patient Active Problem List   Diagnosis Date Noted  . Unilateral primary osteoarthritis, right knee 12/10/2018  . Status post total right knee replacement 12/10/2018  . Long term  (current) use of anticoagulants 08/16/2018  . Hypertensive cardiovascular disease 06/04/2018  . Morbid obesity with BMI of 40.0-44.9, adult ([place]) 06/04/2018  . Fatigue 06/04/2018  . Chronic anticoagulation   . Atrial fibrillation and flutter ([place]) 05/02/2018  . Atrial fibrillation with RVR ([place]) 05/02/2018  . PAF (paroxysmal atrial fibrillation) ([place]) 05/02/2018  . Primary osteoarthritis of right knee 06/14/2017  . OSA (obstructive sleep apnea) 02/13/2017  .  NIDDM-type 2 10/31/2016  . Essential hypertension 08/02/2016  . Anxiety and depression 08/02/2016  . Insomnia 08/02/2016  . Arthritis of left knee 10/13/2014  . Status post total left knee replacement 10/13/2014    Scot Jun, PTA 01/09/2019, 12:39 PM  [place] [address] [place], [place], [address] Phone: [phone]   Fax:  [phone]  Name: Kaliana Albino MRN: [id number] Date of Birth: March 11, 1951

## 2019-01-14 ENCOUNTER — Ambulatory Visit: Payer: Medicare Other | Admitting: Physical Therapy

## 2019-01-14 ENCOUNTER — Encounter: Payer: Self-pay | Admitting: Physical Therapy

## 2019-01-14 ENCOUNTER — Other Ambulatory Visit: Payer: Self-pay

## 2019-01-14 DIAGNOSIS — R262 Difficulty in walking, not elsewhere classified: Secondary | ICD-10-CM

## 2019-01-14 DIAGNOSIS — R600 Localized edema: Secondary | ICD-10-CM

## 2019-01-14 DIAGNOSIS — M25561 Pain in right knee: Secondary | ICD-10-CM

## 2019-01-14 DIAGNOSIS — M25661 Stiffness of right knee, not elsewhere classified: Secondary | ICD-10-CM

## 2019-01-14 DIAGNOSIS — M25611 Stiffness of right shoulder, not elsewhere classified: Secondary | ICD-10-CM | POA: Diagnosis not present

## 2019-01-14 DIAGNOSIS — M25511 Pain in right shoulder: Secondary | ICD-10-CM | POA: Diagnosis not present

## 2019-01-14 NOTE — Therapy (Signed)
[place], [place], [address] Phone: [phone]   Fax:  [phone]  Physical Therapy Treatment  Patient Details  Name: Debra Wright MRN: [id number] Date of Birth: 08-03-1951 Referring Provider (PT): Ninfa Linden   Encounter Date: 01/14/2019  PT End of Session - 01/14/19 1323    Visit Number  7    Date for PT Re-Evaluation  02/24/19    PT Start Time  1231    PT Stop Time  1316    PT Time Calculation (min)  45 min    Activity Tolerance  Patient tolerated treatment well    Behavior During Therapy  [facility] for tasks assessed/performed       Past Medical History:  Diagnosis Date  . Anxiety   . Arthritis    "knees; right thumb" (10/14/2014)  . Basal cell carcinoma    "burned off upper lip & right shoulder"  . Chronic anticoagulation    CHADS VASC=3  . Complication of anesthesia    after spinal anesthesia for left knee replacement, bladder did "not wake up". Was incontinent for 4 days post surgery  . Depression   . Fibroid tumor   . Frequent diarrhea   . GERD (gastroesophageal reflux disease)   . High cholesterol   . Hypertension    EF 65-70% grade 2DD  . Insomnia   . Migraine    "frequently when I was younger; maybe monthly now" (10/14/2014) - none after hysterectomy  . PAF (paroxysmal atrial fibrillation) ([place]) 05/02/2018   converted with rate control  . Pernicious anemia    pt not aware of this  . Sleep apnea    uses a cpap  . Type II diabetes mellitus ([place])   . Vitamin B 12 deficiency     Past Surgical History:  Procedure Laterality Date  . ABDOMINAL HYSTERECTOMY  1988  . ACHILLES TENDON SURGERY Right   . APPENDECTOMY  1988  . BILATERAL OOPHORECTOMY  2004  . CARPAL TUNNEL RELEASE Bilateral 1986  . CATARACT EXTRACTION W/ INTRAOCULAR LENS  IMPLANT, BILATERAL Bilateral 2011  . COLONOSCOPY    . JOINT REPLACEMENT    . SHOULDER ARTHROSCOPY DISTAL CLAVICLE EXCISION AND OPEN ROTATOR  CUFF REPAIR Right 11/2013  . TOENAIL EXCISION Right 04/2018   "big toe"  . TONSILLECTOMY  1950's  . TOTAL KNEE ARTHROPLASTY Left 10/13/2014   Procedure: LEFT TOTAL KNEE ARTHROPLASTY;  Surgeon: Mcarthur Rossetti, MD;  Location: [place];  Service: Orthopedics;  Laterality: Left;  . TOTAL KNEE ARTHROPLASTY Right 12/10/2018   Procedure: RIGHT TOTAL KNEE ARTHROPLASTY;  Surgeon: Mcarthur Rossetti, MD;  Location: [place];  Service: Orthopedics;  Laterality: Right;    There were no vitals filed for this visit.  Subjective Assessment - 01/14/19 1236    Subjective  I am hurting some, "I guess the biggest issue is walking"    Currently in Pain?  Yes    Pain Score  2     Pain Location  Knee    Pain Descriptors / Indicators  Aching    Aggravating Factors   the more I do, the more I hurt                       [facility] Adult PT Treatment/Exercise - 01/14/19 0001      Ambulation/Gait   Gait Comments  gait around the front parking island with SPC, slow pace, some cues to bend the knee and  decrease antalgic gait      Knee/Hip Exercises: Stretches   Gastroc Stretch  3 reps;20 seconds      Knee/Hip Exercises: Aerobic   Recumbent Bike  partial revolutions, could not go around due to pain    Nustep  L4 x 6 min       Knee/Hip Exercises: Machines for Strengthening   Cybex Knee Extension  5lb 2x10,  RLE 5lb x10, 2x5    Cybex Knee Flexion  25lb 2x10, right only 10# 2x5    Cybex Leg Press  20# 2x10, then no weight working on going lower      Knee/Hip Exercises: Seated   Sit to [employer]  2 sets;10 reps;with UE support               PT Short Term Goals - 01/01/19 1142      PT SHORT TERM GOAL #1   Title  Independent with initial HEP     Status  Achieved        PT Long Term Goals - 01/14/19 1327      PT LONG TERM GOAL #1   Title  Independent with advanced HEP    Status  Partially Met      PT LONG TERM GOAL #2   Title  increase right knee AROM to 5-115 degrees flexion     Status  On-going      PT LONG TERM GOAL #3   Title  walk with least restrictive device 500 feet and minimal deviation    Status  Partially Met            Plan - 01/14/19 1324    Clinical Impression Statement  Patient continues to hold the leg is a guarded way, she is tight and unable to tolerate full revolutions on the bike, she does have tightness in the calves, she was able to tolerate some walking but at a slow pace    PT Next Visit Plan  work on the [employer] and Agree with Plan of Care  Patient       Patient will benefit from skilled therapeutic intervention in order to improve the following deficits and impairments:  Abnormal gait, Cardiopulmonary status limiting activity, Decreased activity tolerance, Decreased balance, Difficulty walking, Increased edema, Impaired flexibility, Decreased strength, Decreased range of motion, Decreased endurance, Decreased mobility, Decreased scar mobility, Pain  Visit Diagnosis: Stiffness of right knee, not elsewhere classified  Localized edema  Acute pain of right knee  Difficulty in walking, not elsewhere classified     Problem List Patient Active Problem List   Diagnosis Date Noted  . Unilateral primary osteoarthritis, right knee 12/10/2018  . Status post total right knee replacement 12/10/2018  . Long term (current) use of anticoagulants 08/16/2018  . Hypertensive cardiovascular disease 06/04/2018  . Morbid obesity with BMI of 40.0-44.9, adult ([place]) 06/04/2018  . Fatigue 06/04/2018  . Chronic anticoagulation   . Atrial fibrillation and flutter ([place]) 05/02/2018  . Atrial fibrillation with RVR ([place]) 05/02/2018  . PAF (paroxysmal atrial fibrillation) ([place]) 05/02/2018  . Primary osteoarthritis of right knee 06/14/2017  . OSA (obstructive sleep apnea) 02/13/2017  . NIDDM-type 2 10/31/2016  . Essential hypertension 08/02/2016  . Anxiety and depression 08/02/2016  . Insomnia 08/02/2016  . Arthritis of left knee  10/13/2014  . Status post total left knee replacement 10/13/2014    Sumner Boast., PT 01/14/2019, 1:28 PM  [place], [place], [address] Phone: [phone]   Fax:  [phone]  Name: Henchy Mccauley MRN: [id number] Date of Birth: Aug 19, 1951

## 2019-01-16 ENCOUNTER — Other Ambulatory Visit: Payer: Self-pay

## 2019-01-16 ENCOUNTER — Encounter: Payer: Self-pay | Admitting: Physical Therapy

## 2019-01-16 ENCOUNTER — Telehealth (INDEPENDENT_AMBULATORY_CARE_PROVIDER_SITE_OTHER): Payer: Self-pay | Admitting: *Deleted

## 2019-01-16 ENCOUNTER — Ambulatory Visit: Payer: Medicare Other | Admitting: Physical Therapy

## 2019-01-16 DIAGNOSIS — M25611 Stiffness of right shoulder, not elsewhere classified: Secondary | ICD-10-CM | POA: Diagnosis not present

## 2019-01-16 DIAGNOSIS — M25661 Stiffness of right knee, not elsewhere classified: Secondary | ICD-10-CM

## 2019-01-16 DIAGNOSIS — M25511 Pain in right shoulder: Secondary | ICD-10-CM | POA: Diagnosis not present

## 2019-01-16 DIAGNOSIS — R262 Difficulty in walking, not elsewhere classified: Secondary | ICD-10-CM

## 2019-01-16 DIAGNOSIS — R600 Localized edema: Secondary | ICD-10-CM

## 2019-01-16 DIAGNOSIS — M25561 Pain in right knee: Secondary | ICD-10-CM

## 2019-01-16 NOTE — Care Plan (Signed)
RNCM attempted 30 day call to patient. Her husband answered and informed she is currently at [facility]. He will give the message to his wife that RNCM called. Informed that CM will be mailing 30 day surveys to her to be completed, with an addressed and self stamped envelope to be returned to the office. Patient's husband stated he would relay this information. Overall he thought she was doing very well with rehab.

## 2019-01-16 NOTE — Therapy (Signed)
[facility] [place], [place], [address] Phone: [phone]   Fax:  [phone]  Physical Therapy Treatment  Patient Details  Name: Debra Wright MRN: [id number] Date of Birth: September 10, 1951 Referring Provider (PT): Hedy Camara Date: 01/16/2019  PT End of Session - 01/16/19 1350    Visit Number  8    Date for PT Re-Evaluation  02/24/19    Authorization Type  Medicare    PT Start Time  9381    PT Stop Time  1358    PT Time Calculation (min)  60 min       Past Medical History:  Diagnosis Date  . Anxiety   . Arthritis    "knees; right thumb" (10/14/2014)  . Basal cell carcinoma    "burned off upper lip & right shoulder"  . Chronic anticoagulation    CHADS VASC=3  . Complication of anesthesia    after spinal anesthesia for left knee replacement, bladder did "not wake up". Was incontinent for 4 days post surgery  . Depression   . Fibroid tumor   . Frequent diarrhea   . GERD (gastroesophageal reflux disease)   . High cholesterol   . Hypertension    EF 65-70% grade 2DD  . Insomnia   . Migraine    "frequently when I was younger; maybe monthly now" (10/14/2014) - none after hysterectomy  . PAF (paroxysmal atrial fibrillation) ([place]) 05/02/2018   converted with rate control  . Pernicious anemia    pt not aware of this  . Sleep apnea    uses a cpap  . Type II diabetes mellitus ([place])   . Vitamin B 12 deficiency     Past Surgical History:  Procedure Laterality Date  . ABDOMINAL HYSTERECTOMY  1988  . ACHILLES TENDON SURGERY Right   . APPENDECTOMY  1988  . BILATERAL OOPHORECTOMY  2004  . CARPAL TUNNEL RELEASE Bilateral 1986  . CATARACT EXTRACTION W/ INTRAOCULAR LENS  IMPLANT, BILATERAL Bilateral 2011  . COLONOSCOPY    . JOINT REPLACEMENT    . SHOULDER ARTHROSCOPY DISTAL CLAVICLE EXCISION AND OPEN ROTATOR CUFF REPAIR Right 11/2013  . TOENAIL EXCISION Right 04/2018   "big toe"  .  TONSILLECTOMY  1950's  . TOTAL KNEE ARTHROPLASTY Left 10/13/2014   Procedure: LEFT TOTAL KNEE ARTHROPLASTY;  Surgeon: Mcarthur Rossetti, MD;  Location: [facility];  Service: Orthopedics;  Laterality: Left;  . TOTAL KNEE ARTHROPLASTY Right 12/10/2018   Procedure: RIGHT TOTAL KNEE ARTHROPLASTY;  Surgeon: Mcarthur Rossetti, MD;  Location: [facility];  Service: Orthopedics;  Laterality: Right;    There were no vitals filed for this visit.  Subjective Assessment - 01/16/19 1256    Subjective   "Ok, I feel a little tired today "    Pain Score  3     Pain Location  Knee    Pain Orientation  Right                       [facility] Adult PT Treatment/Exercise - 01/16/19 0001      Knee/Hip Exercises: Aerobic   Recumbent Bike  partial revolutions, could not go around due to pain    Nustep  L3 x 6 min LE only       Knee/Hip Exercises: Machines for Strengthening   Cybex Knee Flexion  25lb 2x10, right only 15# x10    Cybex Leg Press  30lb 2x10 progressive lowering L5, RLE  no weight x10, RLE TKE 20lb x10       Knee/Hip Exercises: Standing   Forward Step Up  Right;3 sets;5 reps;Hand Hold: 0;Step Height: 4";Step Height: 6"      Knee/Hip Exercises: Seated   Sit to Sand  2 sets;10 reps;without UE support      Vasopneumatic   Number Minutes Vasopneumatic   10 minutes    Vasopnuematic Location   Knee    Vasopneumatic Pressure  Medium    Vasopneumatic Temperature   37      Manual Therapy   Manual Therapy  Passive ROM;Joint mobilization    Joint Mobilization  TF Grades 1-2    Passive ROM  R knee flex and ext    Muscle Energy Technique  R knee contract relax to increase flexion               PT Short Term Goals - 01/01/19 1142      PT SHORT TERM GOAL #1   Title  Independent with initial HEP     Status  Achieved        PT Long Term Goals - 01/14/19 1327      PT LONG TERM GOAL #1   Title  Independent with advanced HEP    Status  Partially Met      PT LONG TERM GOAL  #2   Title  increase right knee AROM to 5-115 degrees flexion    Status  On-going      PT LONG TERM GOAL #3   Title  walk with least restrictive device 500 feet and minimal deviation    Status  Partially Met            Plan - 01/16/19 1351    Clinical Impression Statement  Pt still unable to tolerate full revolutions in bike due to pain and tightness. She does well with all strengthen exercises in using full available ROM. Progressed to some forward step ups , some compensation noted with the 6 in step. Pt tends to circumduct RLE and hop off LRI to complete the movement. She is able to correct the circumduction with cues.    Comorbidities  DM, anxiety, depression, past achiiles rupture    Examination-Activity Limitations  Bed Mobility;Stairs;Squat;Locomotion Level;Stand;Toileting;Transfers    Examination-Participation Restrictions  Shop;Cleaning;Driving    Stability/Clinical Decision Making  Evolving/Moderate complexity    Rehab Potential  Good    PT Frequency  3x / week    PT Duration  8 weeks    PT Treatment/Interventions  ADLs/Self Care Home Management;Cryotherapy;Electrical Stimulation;Functional mobility training;Stair training;Gait training;Therapeutic activities;Therapeutic exercise;Balance training;Neuromuscular re-education;Manual techniques;Patient/family education;Vasopneumatic Device    PT Next Visit Plan  work on the ROM       Patient will benefit from skilled therapeutic intervention in order to improve the following deficits and impairments:  Abnormal gait, Cardiopulmonary status limiting activity, Decreased activity tolerance, Decreased balance, Difficulty walking, Increased edema, Impaired flexibility, Decreased strength, Decreased range of motion, Decreased endurance, Decreased mobility, Decreased scar mobility, Pain  Visit Diagnosis: Localized edema  Acute pain of right knee  Difficulty in walking, not elsewhere classified  Stiffness of right knee, not  elsewhere classified     Problem List Patient Active Problem List   Diagnosis Date Noted  . Unilateral primary osteoarthritis, right knee 12/10/2018  . Status post total right knee replacement 12/10/2018  . Long term (current) use of anticoagulants 08/16/2018  . Hypertensive cardiovascular disease 06/04/2018  . Morbid obesity with BMI of 40.0-44.9, adult ([place]) 06/04/2018  .  Fatigue 06/04/2018  . Chronic anticoagulation   . Atrial fibrillation and flutter ([place]) 05/02/2018  . Atrial fibrillation with RVR ([place]) 05/02/2018  . PAF (paroxysmal atrial fibrillation) ([place]) 05/02/2018  . Primary osteoarthritis of right knee 06/14/2017  . OSA (obstructive sleep apnea) 02/13/2017  . NIDDM-type 2 10/31/2016  . Essential hypertension 08/02/2016  . Anxiety and depression 08/02/2016  . Insomnia 08/02/2016  . Arthritis of left knee 10/13/2014  . Status post total left knee replacement 10/13/2014    Scot Jun, PTA 01/16/2019, 1:57 PM  [facility] [place], [place], [address] Phone: [phone]   Fax:  [phone]  Name: Jorge Amparo MRN: [id number] Date of Birth: [birth year]

## 2019-01-16 NOTE — Telephone Encounter (Signed)
Ortho Bundle 30 day call made; Mailed 30 day surveys to patient for completion and return to [facility].

## 2019-01-17 ENCOUNTER — Other Ambulatory Visit (INDEPENDENT_AMBULATORY_CARE_PROVIDER_SITE_OTHER): Payer: Self-pay | Admitting: Family Medicine

## 2019-01-17 DIAGNOSIS — Z760 Encounter for issue of repeat prescription: Secondary | ICD-10-CM

## 2019-01-17 DIAGNOSIS — G4700 Insomnia, unspecified: Secondary | ICD-10-CM

## 2019-01-19 ENCOUNTER — Other Ambulatory Visit (INDEPENDENT_AMBULATORY_CARE_PROVIDER_SITE_OTHER): Payer: Self-pay | Admitting: Orthopaedic Surgery

## 2019-01-20 ENCOUNTER — Encounter (INDEPENDENT_AMBULATORY_CARE_PROVIDER_SITE_OTHER): Payer: Self-pay

## 2019-01-20 ENCOUNTER — Other Ambulatory Visit (INDEPENDENT_AMBULATORY_CARE_PROVIDER_SITE_OTHER): Payer: Self-pay

## 2019-01-20 ENCOUNTER — Other Ambulatory Visit (INDEPENDENT_AMBULATORY_CARE_PROVIDER_SITE_OTHER): Payer: Self-pay | Admitting: Family Medicine

## 2019-01-20 ENCOUNTER — Other Ambulatory Visit (INDEPENDENT_AMBULATORY_CARE_PROVIDER_SITE_OTHER): Payer: Self-pay | Admitting: Physician Assistant

## 2019-01-20 DIAGNOSIS — Z760 Encounter for issue of repeat prescription: Secondary | ICD-10-CM

## 2019-01-20 DIAGNOSIS — G4700 Insomnia, unspecified: Secondary | ICD-10-CM

## 2019-01-20 MED ORDER — OXYCODONE HCL 5 MG PO TABS: 5.0000 mg | ORAL_TABLET | ORAL | 0 refills | Status: DC | PRN

## 2019-01-20 NOTE — Telephone Encounter (Signed)
Please advise 

## 2019-01-20 NOTE — Telephone Encounter (Signed)
CB Patient

## 2019-01-20 NOTE — Telephone Encounter (Signed)
CB patient

## 2019-01-21 ENCOUNTER — Other Ambulatory Visit: Payer: Self-pay

## 2019-01-21 ENCOUNTER — Encounter: Payer: Self-pay | Admitting: Physical Therapy

## 2019-01-21 ENCOUNTER — Ambulatory Visit: Payer: Medicare Other | Admitting: Physical Therapy

## 2019-01-21 DIAGNOSIS — R262 Difficulty in walking, not elsewhere classified: Secondary | ICD-10-CM

## 2019-01-21 DIAGNOSIS — M25661 Stiffness of right knee, not elsewhere classified: Secondary | ICD-10-CM

## 2019-01-21 DIAGNOSIS — M25511 Pain in right shoulder: Secondary | ICD-10-CM | POA: Diagnosis not present

## 2019-01-21 DIAGNOSIS — M25611 Stiffness of right shoulder, not elsewhere classified: Secondary | ICD-10-CM | POA: Diagnosis not present

## 2019-01-21 DIAGNOSIS — M25561 Pain in right knee: Secondary | ICD-10-CM

## 2019-01-21 DIAGNOSIS — R600 Localized edema: Secondary | ICD-10-CM

## 2019-01-21 MED ORDER — ESZOPICLONE 3 MG PO TABS: 3.0000 mg | ORAL_TABLET | Freq: Every evening | ORAL | 3 refills | Status: DC | PRN

## 2019-01-21 NOTE — Telephone Encounter (Signed)
Rx request 

## 2019-01-21 NOTE — Therapy (Signed)
[facility] [place], [place], [address] Phone: [phone]   Fax:  [phone]  Physical Therapy Treatment  Patient Details  Name: Debra Wright MRN: [id number] Date of Birth: [birth year] Referring Provider (PT): Ninfa Linden   Encounter Date: 01/21/2019  PT End of Session - 01/21/19 1350    Visit Number  9    Date for PT Re-Evaluation  02/24/19    Authorization Type  Medicare    PT Start Time  1300    PT Stop Time  1400    PT Time Calculation (min)  60 min    Activity Tolerance  Patient tolerated treatment well    Behavior During Therapy  [facility] for tasks assessed/performed       Past Medical History:  Diagnosis Date  . Anxiety   . Arthritis    "knees; right thumb" (10/14/2014)  . Basal cell carcinoma    "burned off upper lip & right shoulder"  . Chronic anticoagulation    CHADS VASC=3  . Complication of anesthesia    after spinal anesthesia for left knee replacement, bladder did "not wake up". Was incontinent for 4 days post surgery  . Depression   . Fibroid tumor   . Frequent diarrhea   . GERD (gastroesophageal reflux disease)   . High cholesterol   . Hypertension    EF 65-70% grade 2DD  . Insomnia   . Migraine    "frequently when I was younger; maybe monthly now" (10/14/2014) - none after hysterectomy  . PAF (paroxysmal atrial fibrillation) ([place]) 05/02/2018   converted with rate control  . Pernicious anemia    pt not aware of this  . Sleep apnea    uses a cpap  . Type II diabetes mellitus ([place])   . Vitamin B 12 deficiency     Past Surgical History:  Procedure Laterality Date  . ABDOMINAL HYSTERECTOMY  1988  . ACHILLES TENDON SURGERY Right   . APPENDECTOMY  1988  . BILATERAL OOPHORECTOMY  2004  . CARPAL TUNNEL RELEASE Bilateral 1986  . CATARACT EXTRACTION W/ INTRAOCULAR LENS  IMPLANT, BILATERAL Bilateral 2011  . COLONOSCOPY    . JOINT REPLACEMENT    . SHOULDER ARTHROSCOPY DISTAL  CLAVICLE EXCISION AND OPEN ROTATOR CUFF REPAIR Right 11/2013  . TOENAIL EXCISION Right 04/2018   "big toe"  . TONSILLECTOMY  1950's  . TOTAL KNEE ARTHROPLASTY Left 10/13/2014   Procedure: LEFT TOTAL KNEE ARTHROPLASTY;  Surgeon: Mcarthur Rossetti, MD;  Location: [facility];  Service: Orthopedics;  Laterality: Left;  . TOTAL KNEE ARTHROPLASTY Right 12/10/2018   Procedure: RIGHT TOTAL KNEE ARTHROPLASTY;  Surgeon: Mcarthur Rossetti, MD;  Location: [facility];  Service: Orthopedics;  Laterality: Right;    There were no vitals filed for this visit.  Subjective Assessment - 01/21/19 1305    Subjective  "Good"    Currently in Pain?  Yes    Pain Score  2     Pain Location  Knee    Pain Orientation  Right                       [facility] Adult PT Treatment/Exercise - 01/21/19 0001      Knee/Hip Exercises: Aerobic   Recumbent Bike  partial revolutions, some full revolutions but heavy lean to L  x 3 min     Nustep  L4 x 6 min       Knee/Hip Exercises: Machines for Strengthening  Cybex Knee Extension  5lb 2x10,  RLE 5lb x10    Cybex Knee Flexion  25lb 2x10, right only 15# x10    Cybex Leg Press  30lb 2x15 progressive lowering L4      Knee/Hip Exercises: Standing   Walking with Sports Cord  30lb 4 way X 3 each      Vasopneumatic   Number Minutes Vasopneumatic   10 minutes    Vasopnuematic Location   Knee    Vasopneumatic Pressure  Medium    Vasopneumatic Temperature   37      Manual Therapy   Manual Therapy  Passive ROM;Joint mobilization    Manual therapy comments  pt very guarded    Passive ROM  R knee flex and ext               PT Short Term Goals - 01/01/19 1142      PT SHORT TERM GOAL #1   Title  Independent with initial HEP     Status  Achieved        PT Long Term Goals - 01/14/19 1327      PT LONG TERM GOAL #1   Title  Independent with advanced HEP    Status  Partially Met      PT LONG TERM GOAL #2   Title  increase right knee AROM to 5-115  degrees flexion    Status  On-going      PT LONG TERM GOAL #3   Title  walk with least restrictive device 500 feet and minimal deviation    Status  Partially Met            Plan - 01/21/19 1351    Clinical Impression Statement  Pt progressing with her strength on machine level interventions. She was able to maintain good stability with resisted gait front and back. She did have some instability with resisted side step. Pain repotted with PROM at end ranges, she is very guarded with MT.     Personal Factors and Comorbidities  Comorbidity 3+;Fitness    Comorbidities  DM, anxiety, depression, past achiiles rupture    Examination-Activity Limitations  Bed Mobility;Stairs;Squat;Locomotion Level;Stand;Toileting;Transfers    Examination-Participation Restrictions  Shop;Cleaning;Driving    Stability/Clinical Decision Making  Evolving/Moderate complexity    Rehab Potential  Good    PT Frequency  3x / week    PT Duration  8 weeks    PT Treatment/Interventions  ADLs/Self Care Home Management;Cryotherapy;Electrical Stimulation;Functional mobility training;Stair training;Gait training;Therapeutic activities;Therapeutic exercise;Balance training;Neuromuscular re-education;Manual techniques;Patient/family education;Vasopneumatic Device    PT Next Visit Plan  work on the ROM and R quad strength       Patient will benefit from skilled therapeutic intervention in order to improve the following deficits and impairments:  Abnormal gait, Cardiopulmonary status limiting activity, Decreased activity tolerance, Decreased balance, Difficulty walking, Increased edema, Impaired flexibility, Decreased strength, Decreased range of motion, Decreased endurance, Decreased mobility, Decreased scar mobility, Pain  Visit Diagnosis: Localized edema  Difficulty in walking, not elsewhere classified  Stiffness of right knee, not elsewhere classified  Acute pain of right knee     Problem List Patient Active  Problem List   Diagnosis Date Noted  . Unilateral primary osteoarthritis, right knee 12/10/2018  . Status post total right knee replacement 12/10/2018  . Long term (current) use of anticoagulants 08/16/2018  . Hypertensive cardiovascular disease 06/04/2018  . Morbid obesity with BMI of 40.0-44.9, adult ([place]) 06/04/2018  . Fatigue 06/04/2018  . Chronic anticoagulation   .  Atrial fibrillation and flutter ([place]) 05/02/2018  . Atrial fibrillation with RVR ([place]) 05/02/2018  . PAF (paroxysmal atrial fibrillation) ([place]) 05/02/2018  . Primary osteoarthritis of right knee 06/14/2017  . OSA (obstructive sleep apnea) 02/13/2017  . NIDDM-type 2 10/31/2016  . Essential hypertension 08/02/2016  . Anxiety and depression 08/02/2016  . Insomnia 08/02/2016  . Arthritis of left knee 10/13/2014  . Status post total left knee replacement 10/13/2014    Scot Jun, PTA 01/21/2019, 1:57 PM  [place], [place], [address] Phone: [phone]   Fax:  [phone]  Name: Debra Wright MRN: [id number] Date of Birth: 07-20-1951

## 2019-01-22 ENCOUNTER — Encounter (INDEPENDENT_AMBULATORY_CARE_PROVIDER_SITE_OTHER): Payer: Self-pay | Admitting: Family Medicine

## 2019-01-23 ENCOUNTER — Ambulatory Visit (INDEPENDENT_AMBULATORY_CARE_PROVIDER_SITE_OTHER): Payer: Medicare Other | Admitting: Orthopaedic Surgery

## 2019-01-23 ENCOUNTER — Encounter (INDEPENDENT_AMBULATORY_CARE_PROVIDER_SITE_OTHER): Payer: Self-pay | Admitting: Family Medicine

## 2019-01-23 ENCOUNTER — Telehealth: Payer: Self-pay | Admitting: Cardiology

## 2019-01-23 ENCOUNTER — Ambulatory Visit: Payer: Medicare Other | Admitting: Physical Therapy

## 2019-01-23 ENCOUNTER — Other Ambulatory Visit (INDEPENDENT_AMBULATORY_CARE_PROVIDER_SITE_OTHER): Payer: Self-pay

## 2019-01-23 DIAGNOSIS — G4700 Insomnia, unspecified: Secondary | ICD-10-CM

## 2019-01-23 DIAGNOSIS — Z760 Encounter for issue of repeat prescription: Secondary | ICD-10-CM

## 2019-01-23 MED ORDER — ESZOPICLONE 3 MG PO TABS: 3.0000 mg | ORAL_TABLET | Freq: Every evening | ORAL | 3 refills | Status: DC | PRN

## 2019-01-23 NOTE — Telephone Encounter (Signed)
Mychart, smartphone, pre reg complete 01/23/19 AF

## 2019-01-27 ENCOUNTER — Encounter: Payer: Self-pay | Admitting: Cardiology

## 2019-01-27 ENCOUNTER — Telehealth (INDEPENDENT_AMBULATORY_CARE_PROVIDER_SITE_OTHER): Payer: Medicare Other | Admitting: Cardiology

## 2019-01-27 ENCOUNTER — Telehealth: Payer: Self-pay

## 2019-01-27 ENCOUNTER — Telehealth: Payer: Self-pay | Admitting: Cardiology

## 2019-01-27 VITALS — BP 146/67 | HR 58 | Ht 63.0 in | Wt 216.0 lb

## 2019-01-27 DIAGNOSIS — Z7901 Long term (current) use of anticoagulants: Secondary | ICD-10-CM

## 2019-01-27 DIAGNOSIS — I480 Paroxysmal atrial fibrillation: Secondary | ICD-10-CM | POA: Diagnosis not present

## 2019-01-27 DIAGNOSIS — G4733 Obstructive sleep apnea (adult) (pediatric): Secondary | ICD-10-CM

## 2019-01-27 DIAGNOSIS — I119 Hypertensive heart disease without heart failure: Secondary | ICD-10-CM

## 2019-01-27 DIAGNOSIS — Z96651 Presence of right artificial knee joint: Secondary | ICD-10-CM

## 2019-01-27 MED ORDER — METOPROLOL TARTRATE 25 MG PO TABS: 12.5000 mg | ORAL_TABLET | Freq: Two times a day (BID) | ORAL | 3 refills | Status: DC

## 2019-01-27 NOTE — Telephone Encounter (Signed)
New message  Patient is returning call to setup virtual visit for 1:30. Please call.

## 2019-01-27 NOTE — Telephone Encounter (Signed)
Patient had visit and Darden Dates will be calling patient to review AVS

## 2019-01-27 NOTE — Progress Notes (Signed)
Virtual Visit via Video Note   This visit type was conducted due to national recommendations for restrictions regarding the COVID-19 Pandemic (e.g. social distancing) in an effort to limit this patient's exposure and mitigate transmission in our community.  Due to her co-morbid illnesses, this patient is at least at moderate risk for complications without adequate follow up.  This format is felt to be most appropriate for this patient at this time.  All issues noted in this document were discussed and addressed.  A limited physical exam was performed with this format.  Please refer to the patient's chart for her consent to telehealth for [facility].  Evaluation Performed:  Follow-up visit  This visit type was conducted due to national recommendations for restrictions regarding the COVID-19 Pandemic (e.g. social distancing).  This format is felt to be most appropriate for this patient at this time.  All issues noted in this document were discussed and addressed.  No physical exam was performed (except for noted visual exam findings with Video Visits).  Please refer to the patient's chart (MyChart message for video visits and phone note for telephone visits) for the patient's consent to telehealth for [facility].  Date:  01/27/2019   ID:  Debra Wright, Debra Wright [birth year], MRN [id number]  Patient Location: Home [id number] [place] [id number]   Provider location:   Home- [place]  PCP:  Eunice Blase, MD  Cardiologist:  Shelva Majestic, MD  Electrophysiologist:  None   Chief Complaint:  Follow up office visit  History of Present Illness:    Debra Wright is a 68 y.o. female who presents via audio/video conferencing for a telehealth visit today.  The patient is a former Agricultural consultant, seen in the ED 05/02/18 with palpitations and tachycardia. The patient has a a history of hypertension, type 2 diabetes mellitus, obstructive sleep apnea on C-pap,  hyperlipidemia and anxiety.  On 8/1/19she had a steroid injection to her right knee. On returning home she had acute onset of palpitations. This was associated with chest pain and diaphoresis.The EMS tracing recorded atrial fibrillation with rapid ventricular rate.In the ED herheart rate was between 150s and 180sandshe was given IV metoprolol (5 mg) and eventually converted to normal sinus rhythm.Lopressor PO was added and her Amlodipine was changed to Diltiazem CD 120mg . Echo in the hospital showed her EF to be normal with moderate LVH, grade 2DD, AOV Ca++ without stenosis, and mild to moderate LAE. OP Myoview 05/16/18 was negative for ischemia. She was seen in the office 06/04/18. She had notes some fatigue and her HR was 50. I decreased her Lopressor to 12.5 mg BID. She also has had an L-spine injecion (off Eliquis) with good results. In October 2019 she was seen in f/u and her b/P was running high. Her diltiazem was increased.   Since that visit she has done well.  She had TKR in March and is recovering from that.  She goes to PT twice a week but otherwise is staying at home with her husband. She did admit to an episode of palpitations 3 nights ago that last for 4-5 hours.  She woke up with an irregular heart beat- "not fast".  It went away spontaneously later that morning and has not recurred.  She is compliant with her C-pap.   The patient does not symptoms concerning for COVID-19 infection (fever, chills, cough, or new SHORTNESS OF BREATH).    Prior CV studies:   The following studies were reviewed  today:   Past Medical History:  Diagnosis Date  . Anxiety   . Arthritis    "knees; right thumb" (10/14/2014)  . Basal cell carcinoma    "burned off upper lip & right shoulder"  . Chronic anticoagulation    CHADS VASC=3  . Complication of anesthesia    after spinal anesthesia for left knee replacement, bladder did "not wake up". Was incontinent for 4 days post surgery  . Depression   . Fibroid tumor   . Frequent  diarrhea   . GERD (gastroesophageal reflux disease)   . High cholesterol   . Hypertension    EF 65-70% grade 2DD  . Insomnia   . Migraine    "frequently when I was younger; maybe monthly now" (10/14/2014) - none after hysterectomy  . PAF (paroxysmal atrial fibrillation) ([place]) 05/02/2018   converted with rate control  . Pernicious anemia    pt not aware of this  . Sleep apnea    uses a cpap  . Type II diabetes mellitus ([place])   . Vitamin B 12 deficiency    Past Surgical History:  Procedure Laterality Date  . ABDOMINAL HYSTERECTOMY  1988  . ACHILLES TENDON SURGERY Right   . APPENDECTOMY  1988  . BILATERAL OOPHORECTOMY  2004  . CARPAL TUNNEL RELEASE Bilateral 1986  . CATARACT EXTRACTION W/ INTRAOCULAR LENS  IMPLANT, BILATERAL Bilateral 2011  . COLONOSCOPY    . JOINT REPLACEMENT    . SHOULDER ARTHROSCOPY DISTAL CLAVICLE EXCISION AND OPEN ROTATOR CUFF REPAIR Right 11/2013  . TOENAIL EXCISION Right 04/2018   "big toe"  . TONSILLECTOMY  1950's  . TOTAL KNEE ARTHROPLASTY Left 10/13/2014   Procedure: LEFT TOTAL KNEE ARTHROPLASTY;  Surgeon: Mcarthur Rossetti, MD;  Location: [facility];  Service: Orthopedics;  Laterality: Left;  . TOTAL KNEE ARTHROPLASTY Right 12/10/2018   Procedure: RIGHT TOTAL KNEE ARTHROPLASTY;  Surgeon: Mcarthur Rossetti, MD;  Location: [facility] [facility];  Service: Orthopedics;  Laterality: Right;     Current Meds  Medication Sig  . atorvastatin (LIPITOR) 40 MG tablet TAKE 1 TABLET BY MOUTH ONCE DAILY (Patient taking differently: Take 40 mg by mouth at bedtime. )  . calcium carbonate (TUMS - DOSED IN MG ELEMENTAL CALCIUM) 500 MG chewable tablet Chew 2 tablets by mouth daily as needed for indigestion or heartburn.  . Calcium Carbonate-Vitamin D (CALCIUM 600 + D PO) Take 1 tablet by mouth daily.  Marland Kitchen CINNAMON PO Take 1 tablet by mouth daily.  . citalopram (CELEXA) 40 MG tablet Take 1 tablet (40 mg total) by mouth daily.  . clonazePAM (KLONOPIN) 0.5 MG tablet TAKE 1/2 TAB BY  MOUTH AT BEDTIME AS NEEDED  . diltiazem (CARDIZEM CD) 180 MG 24 hr capsule Take 1 capsule (180 mg total) by mouth daily.  . Eszopiclone 3 MG TABS Take 1 tablet (3 mg total) by mouth at bedtime as needed. Take immediately before bedtime  . fluconazole (DIFLUCAN) 100 MG tablet Take 1 tablet (100 mg total) by mouth daily.  Marland Kitchen glipiZIDE (GLIPIZIDE XL) 5 MG 24 hr tablet TAKE 1 TABLET BY MOUTH ONCE DAILY WITH BREAKFAST (Patient taking differently: Take 5 mg by mouth daily with breakfast. TAKE 1 TABLET BY MOUTH ONCE DAILY WITH BREAKFAST)  . magnesium oxide (MAG-OX) 400 (241.3 Mg) MG tablet Take 400 mg by mouth daily.  . metFORMIN (GLUCOPHAGE) 1000 MG tablet Take 1 tablet (1,000 mg total) by mouth 2 (two) times daily with a meal.  . methocarbamol (ROBAXIN) 500 MG tablet Take 1  tablet (500 mg total) by mouth every 6 (six) hours as needed for muscle spasms.  . metoprolol tartrate (LOPRESSOR) 25 MG tablet Take 0.5 tablets (12.5 mg total) by mouth 2 (two) times daily.  . Omega-3 Fatty Acids (OMEGA-3 FISH OIL) 1200 MG CAPS Take 2,400 mg by mouth 2 (two) times daily.  Marland Kitchen omeprazole (PRILOSEC) 40 MG capsule TAKE 1 CAPSULE BY MOUTH ONCE DAILY IN THE MORNING (Patient taking differently: Take 40 mg by mouth every morning. )  . warfarin (COUMADIN) 5 MG tablet TAKE 1/2 TO 1 TABLET DAILY AS DIRECTED BY COUMADIN CLINIC     Allergies:   Antivert [meclizine hcl]   Social History   Tobacco Use  . Smoking status: Never Smoker  . Smokeless tobacco: Never Used  Substance Use Topics  . Alcohol use: Yes    Comment: 10/14/2014 "might have a drink when I'm out once/month"  . Drug use: No     Family Hx: The patient's family history includes Arthritis in her father, maternal grandfather, maternal grandmother, mother, paternal grandfather, and paternal grandmother; Diabetes in her brother, father, maternal grandmother, mother, paternal grandfather, and paternal grandmother; Heart disease in her father; Hyperlipidemia in her  father, maternal grandfather, maternal grandmother, mother, paternal grandfather, and paternal grandmother; Hypertension in her brother, maternal grandfather, maternal grandmother, mother, paternal grandfather, and paternal grandmother; Stroke in her brother.  ROS:   Please see the history of present illness.    All other systems reviewed and are negative.   Labs/Other Tests and Data Reviewed:    Recent Labs: 05/03/2018: B Natriuretic Peptide 110.6; Magnesium 1.3 08/07/2018: ALT 12; TSH 2.52 12/11/2018: BUN <5; Creatinine, Ser 0.61; Potassium 3.7; Sodium 135 12/12/2018: Hemoglobin 8.8; Platelets 251   Recent Lipid Panel Lab Results  Component Value Date/Time   CHOL 138 08/07/2018 03:30 PM   TRIG 185 (H) 08/07/2018 03:30 PM   HDL 46 (L) 08/07/2018 03:30 PM   CHOLHDL 3.0 08/07/2018 03:30 PM   LDLCALC 66 08/07/2018 03:30 PM   LDLDIRECT 42.0 01/31/2018 09:43 AM    Wt Readings from Last 3 Encounters:  01/27/19 216 lb (98 kg)  12/10/18 219 lb (99.3 kg)  12/02/18 219 lb (99.3 kg)     Exam:    Vital Signs:  BP (!) 146/67   Pulse (!) 58   Ht 5\' 3"  (1.6 m)   Wt 216 lb (98 kg)   BMI 38.26 kg/m    Overweight female well developed female in no acute distress.  ASSESSMENT & PLAN:    PAF (paroxysmal atrial fibrillation) ([place]) converted with rate control8/1/19  Chronic anticoagulation CHADS VASC=3  Hypertensive cardiovascular disease Normal LVF with grade 2 DD and mild to moderate LAE Sub optimal B/P control since she was changed from Amlodipine to Diltiazem.   OSA (obstructive sleep apnea) On C-pap  NIDDM-type 2 On Glucophage  Morbid obesity with BMI of 40.0-44.9, adult ([place])  COVID-19 Education: The signs and symptoms of COVID-19 were discussed with the patient and how to seek care for testing (follow up with PCP or arrange E-visit).  The importance of social distancing was discussed today.  Patient Risk:   After full review of this patients clinical status, I  feel that they are at least moderate risk at this time.  Time:   Today, I have spent 15 minutes with the patient with telehealth technology discussing palpitations.     Medication Adjustments/Labs and Tests Ordered: Current medicines are reviewed at length with the patient today.  Concerns regarding medicines  are outlined above.  Tests Ordered: No orders of the defined types were placed in this encounter.  Medication Changes: No orders of the defined types were placed in this encounter.   Disposition:  I told her Debra Wright can take an extra 12.5 mg Lopressor PRN (once a day) for palpitations.  F/U Dr Claiborne Billings in Sept  Signed, Ezrie Bunyan, Hershal Coria  01/27/2019 2:17 PM    [facility]

## 2019-01-27 NOTE — Patient Instructions (Signed)
Medication Instructions:  You can take a extra Metoprolol 12.5mg  as needed for palpitations  If you need a refill on your cardiac medications before your next appointment, please call your pharmacy.   Lab work: None  If you have labs (blood work) drawn today and your tests are completely normal, you will receive your results only by: Marland Kitchen MyChart Message (if you have MyChart) OR . A paper copy in the mail If you have any lab test that is abnormal or we need to change your treatment, we will call you to review the results.  Testing/Procedures: None   Follow-Up: At [facility], you and your health needs are our priority.  As part of our continuing mission to provide you with exceptional heart care, we have created designated Provider Care Teams.  These Care Teams include your primary Cardiologist (physician) and Advanced Practice Providers (APPs -  Physician Assistants and Nurse Practitioners) who all work together to provide you with the care you need, when you need it. You will need a follow up appointment in 5 months. Please call our office 2 months in advance to schedule this appointment.  You may see Shelva Majestic, MD or one of the following Advanced Practice Providers on your designated Care Team: [place], [place] . Fabian Sharp, PA-C  Any Other Special Instructions Will Be Listed Below (If Applicable).

## 2019-01-27 NOTE — Telephone Encounter (Signed)
Spoke with Patient and AVS instructions were reviewed. Patient voiced understanding.

## 2019-01-28 ENCOUNTER — Encounter: Payer: Self-pay | Admitting: Physical Therapy

## 2019-01-28 ENCOUNTER — Other Ambulatory Visit: Payer: Self-pay

## 2019-01-28 ENCOUNTER — Telehealth: Payer: Self-pay

## 2019-01-28 ENCOUNTER — Ambulatory Visit: Payer: Medicare Other | Admitting: Physical Therapy

## 2019-01-28 DIAGNOSIS — M25561 Pain in right knee: Secondary | ICD-10-CM | POA: Diagnosis not present

## 2019-01-28 DIAGNOSIS — M25661 Stiffness of right knee, not elsewhere classified: Secondary | ICD-10-CM | POA: Diagnosis not present

## 2019-01-28 DIAGNOSIS — M25511 Pain in right shoulder: Secondary | ICD-10-CM | POA: Diagnosis not present

## 2019-01-28 DIAGNOSIS — R262 Difficulty in walking, not elsewhere classified: Secondary | ICD-10-CM | POA: Diagnosis not present

## 2019-01-28 DIAGNOSIS — R600 Localized edema: Secondary | ICD-10-CM

## 2019-01-28 DIAGNOSIS — M25611 Stiffness of right shoulder, not elsewhere classified: Secondary | ICD-10-CM | POA: Diagnosis not present

## 2019-01-28 NOTE — Telephone Encounter (Signed)

## 2019-01-28 NOTE — Therapy (Addendum)
[address] [place], [place], [address] Phone: [phone]   Fax:  [phone] Progress Note Reporting Period 12/25/18 to 01/28/19 for the first 10 visits  See note below for Objective Data and Assessment of Progress/Goals.      Physical Therapy Treatment  Patient Details  Name: Debra Wright MRN: [id number] Date of Birth: Jul 04, 1951 Referring Provider (PT): Hedy Camara Date: 01/28/2019  PT End of Session - 01/28/19 1350    Visit Number  10    Date for PT Re-Evaluation  02/24/19    Authorization Type  Medicare    PT Start Time  1300    PT Stop Time  9038    PT Time Calculation (min)  55 min    Activity Tolerance  Patient tolerated treatment well    Behavior During Therapy  [facility] for tasks assessed/performed       Past Medical History:  Diagnosis Date  . Anxiety   . Arthritis    "knees; right thumb" (10/14/2014)  . Basal cell carcinoma    "burned off upper lip & right shoulder"  . Chronic anticoagulation    CHADS VASC=3  . Complication of anesthesia    after spinal anesthesia for left knee replacement, bladder did "not wake up". Was incontinent for 4 days post surgery  . Depression   . Fibroid tumor   . Frequent diarrhea   . GERD (gastroesophageal reflux disease)   . High cholesterol   . Hypertension    EF 65-70% grade 2DD  . Insomnia   . Migraine    "frequently when I was younger; maybe monthly now" (10/14/2014) - none after hysterectomy  . PAF (paroxysmal atrial fibrillation) ([place]) 05/02/2018   converted with rate control  . Pernicious anemia    pt not aware of this  . Sleep apnea    uses a cpap  . Type II diabetes mellitus ([place])   . Vitamin B 12 deficiency     Past Surgical History:  Procedure Laterality Date  . ABDOMINAL HYSTERECTOMY  1988  . ACHILLES TENDON SURGERY Right   . APPENDECTOMY  1988  . BILATERAL OOPHORECTOMY  2004  . CARPAL TUNNEL RELEASE Bilateral 1986   . CATARACT EXTRACTION W/ INTRAOCULAR LENS  IMPLANT, BILATERAL Bilateral 2011  . COLONOSCOPY    . JOINT REPLACEMENT    . SHOULDER ARTHROSCOPY DISTAL CLAVICLE EXCISION AND OPEN ROTATOR CUFF REPAIR Right 11/2013  . TOENAIL EXCISION Right 04/2018   "big toe"  . TONSILLECTOMY  1950's  . TOTAL KNEE ARTHROPLASTY Left 10/13/2014   Procedure: LEFT TOTAL KNEE ARTHROPLASTY;  Surgeon: Mcarthur Rossetti, MD;  Location: [facility];  Service: Orthopedics;  Laterality: Left;  . TOTAL KNEE ARTHROPLASTY Right 12/10/2018   Procedure: RIGHT TOTAL KNEE ARTHROPLASTY;  Surgeon: Mcarthur Rossetti, MD;  Location: [facility];  Service: Orthopedics;  Laterality: Right;    There were no vitals filed for this visit.  Subjective Assessment - 01/28/19 1302    Subjective  "Doing good, no pain"    Currently in Pain?  No/denies                       [facility] Adult PT Treatment/Exercise - 01/28/19 0001      Ambulation/Gait   Gait Comments  gait down 1 flight of stairs, up hill to the front of building back down long hall. Some Rknee pain with eccnetric loads on steps. Decrease R heel push  off with ambulation.      Knee/Hip Exercises: Aerobic   Recumbent Bike  full revolutions but heavy lean to L  x 3 min     Nustep  L4 x 6 min       Knee/Hip Exercises: Machines for Strengthening   Cybex Knee Extension  RLE 5lb x10    Cybex Knee Flexion  RLE 20lb 2x10     Cybex Leg Press  40lb 2x10, RLE TKE 20lb 2x10       Knee/Hip Exercises: Standing   Lateral Step Up  Right;1 set;10 reps;Step Height: 6";Hand Hold: 0    Forward Step Up  Right;Hand Hold: 0;Step Height: 6";10 reps;2 sets      Vasopneumatic   Number Minutes Vasopneumatic   15 minutes    Vasopnuematic Location   Knee    Vasopneumatic Pressure  Medium    Vasopneumatic Temperature   37               PT Short Term Goals - 01/01/19 1142      PT SHORT TERM GOAL #1   Title  Independent with initial HEP     Status  Achieved        PT  Long Term Goals - 01/14/19 1327      PT LONG TERM GOAL #1   Title  Independent with advanced HEP    Status  Partially Met      PT LONG TERM GOAL #2   Title  increase right knee AROM to 5-115 degrees flexion    Status  On-going      PT LONG TERM GOAL #3   Title  walk with least restrictive device 500 feet and minimal deviation    Status  Partially Met            Plan - 01/28/19 1350    Clinical Impression Statement  Increase weight tolerated on machines. Some compensation noted with step up interventions, this became more prominent as she fatigued. Cues needed at times not to circumduct RLE when stepping up. R quad weakness and pain noted when descending stairs. Decrease toe off with ambulation.    Personal Factors and Comorbidities  Comorbidity 3+;Fitness    Comorbidities  DM, anxiety, depression, past achiiles rupture    Examination-Activity Limitations  Bed Mobility;Stairs;Squat;Locomotion Level;Stand;Toileting;Transfers    Examination-Participation Restrictions  Shop;Cleaning;Driving    Stability/Clinical Decision Making  Evolving/Moderate complexity    Rehab Potential  Good    PT Frequency  3x / week    PT Duration  8 weeks    PT Treatment/Interventions  ADLs/Self Care Home Management;Cryotherapy;Electrical Stimulation;Functional mobility training;Stair training;Gait training;Therapeutic activities;Therapeutic exercise;Balance training;Neuromuscular re-education;Manual techniques;Patient/family education;Vasopneumatic Device    PT Next Visit Plan  work on the ROM and R quad strength       Patient will benefit from skilled therapeutic intervention in order to improve the following deficits and impairments:     Visit Diagnosis: Localized edema  Difficulty in walking, not elsewhere classified  Stiffness of right knee, not elsewhere classified  Acute pain of right knee     Problem List Patient Active Problem List   Diagnosis Date Noted  . Unilateral primary  osteoarthritis, right knee 12/10/2018  . Status post total right knee replacement 12/10/2018  . Long term (current) use of anticoagulants 08/16/2018  . Hypertensive cardiovascular disease 06/04/2018  . Morbid obesity with BMI of 40.0-44.9, adult ([place]) 06/04/2018  . Fatigue 06/04/2018  . Chronic anticoagulation   . Atrial fibrillation and flutter ([place])  05/02/2018  . Atrial fibrillation with RVR ([place]) 05/02/2018  . PAF (paroxysmal atrial fibrillation) ([place]) 05/02/2018  . Primary osteoarthritis of right knee 06/14/2017  . OSA (obstructive sleep apnea) 02/13/2017  . NIDDM-type 2 10/31/2016  . Essential hypertension 08/02/2016  . Anxiety and depression 08/02/2016  . Insomnia 08/02/2016  . Arthritis of left knee 10/13/2014  . Status post total left knee replacement 10/13/2014    Scot Jun, PTA 01/28/2019, 1:55 PM  [address] [place], [place], [address] Phone: [phone]   Fax:  [phone]  Name: Blayklee Mable MRN: [id number] Date of Birth: September 29, 1951

## 2019-01-29 ENCOUNTER — Ambulatory Visit (INDEPENDENT_AMBULATORY_CARE_PROVIDER_SITE_OTHER): Payer: Medicare Other | Admitting: Pharmacist Clinician (PhC)/ Clinical Pharmacy Specialist

## 2019-01-29 DIAGNOSIS — I4892 Unspecified atrial flutter: Secondary | ICD-10-CM | POA: Diagnosis not present

## 2019-01-29 DIAGNOSIS — Z7901 Long term (current) use of anticoagulants: Secondary | ICD-10-CM | POA: Diagnosis not present

## 2019-01-29 DIAGNOSIS — I4891 Unspecified atrial fibrillation: Secondary | ICD-10-CM | POA: Diagnosis not present

## 2019-01-29 LAB — POCT INR: INR: 1.4 — AB (ref 2.0–3.0)

## 2019-01-30 ENCOUNTER — Ambulatory Visit (INDEPENDENT_AMBULATORY_CARE_PROVIDER_SITE_OTHER): Payer: Medicare Other | Admitting: Orthopaedic Surgery

## 2019-01-30 ENCOUNTER — Encounter (INDEPENDENT_AMBULATORY_CARE_PROVIDER_SITE_OTHER): Payer: Self-pay | Admitting: Orthopaedic Surgery

## 2019-01-30 ENCOUNTER — Encounter: Payer: Self-pay | Admitting: Physical Therapy

## 2019-01-30 ENCOUNTER — Ambulatory Visit: Payer: Medicare Other | Admitting: Physical Therapy

## 2019-01-30 ENCOUNTER — Other Ambulatory Visit: Payer: Self-pay

## 2019-01-30 DIAGNOSIS — M25511 Pain in right shoulder: Secondary | ICD-10-CM

## 2019-01-30 DIAGNOSIS — M25561 Pain in right knee: Secondary | ICD-10-CM | POA: Diagnosis not present

## 2019-01-30 DIAGNOSIS — Z96651 Presence of right artificial knee joint: Secondary | ICD-10-CM

## 2019-01-30 DIAGNOSIS — R262 Difficulty in walking, not elsewhere classified: Secondary | ICD-10-CM | POA: Diagnosis not present

## 2019-01-30 DIAGNOSIS — G8929 Other chronic pain: Secondary | ICD-10-CM

## 2019-01-30 DIAGNOSIS — M25661 Stiffness of right knee, not elsewhere classified: Secondary | ICD-10-CM | POA: Diagnosis not present

## 2019-01-30 DIAGNOSIS — M25611 Stiffness of right shoulder, not elsewhere classified: Secondary | ICD-10-CM | POA: Diagnosis not present

## 2019-01-30 DIAGNOSIS — R600 Localized edema: Secondary | ICD-10-CM | POA: Diagnosis not present

## 2019-01-30 NOTE — Progress Notes (Signed)
Office Visit Note   Patient: Debra Wright           Date of Birth: Aug 07, 1951           MRN: [id number] Visit Date: 01/30/2019              Requested by: Eunice Blase, MD [address] [place], [place] [address] PCP: Eunice Blase, MD   Assessment & Plan: Visit Diagnoses:  1. Status post total right knee replacement   2. Chronic right shoulder pain     Plan: I do feel it is appropriate to place a steroid injection subacromial space of her right shoulder today and explained the rationale and reasoning behind this as well as the risk and benefits.  She agreed to have this done and tolerated it well.  I did give her a note to give to her outpatient physical therapist for continuing her physical therapy on her right knee to get as much progress out of knee as possible as well as adding therapy to her right shoulder to hopefully improve shoulder function.  We will see her back in 4 weeks to see how she is doing overall but no x-rays are needed.  All question concerns were answered and addressed.  She was also seen with Judeen Hammans our case manager.  Follow-Up Instructions: Return in about 4 weeks (around 02/27/2019).   Orders:  No orders of the defined types were placed in this encounter.  No orders of the defined types were placed in this encounter.     Procedures: No procedures performed   Clinical Data: No additional findings.   Subjective: Chief Complaint  Patient presents with  . Right Knee - Follow-up  The patient is being seen in follow-up today 51 days status post a right total knee arthroplasty.  While she was here she would like me to look at her right shoulder.  She has remote history of a right shoulder arthroscopy with rotator cuff repair done about 5 years ago.  She is a very active 68 years old.  Therapy notes show that she is making progress.  She also attest to the fact that she is making progress.  She says her range of motion and strength are increasing with  her right knee and the swelling is decreasing.  She said she is now walking without a cane or walker.  Her right shoulder is been quite stiff with overhead activities.  She cannot fully abduct her shoulder and is detriment affecting her actives of daily living as a relates to her right dominant upper extremity.  HPI  Review of Systems She currently denies any headache, chest pain, short of breath, fever, chills, nausea, vomiting  Objective: Vital Signs: There were no vitals taken for this visit.  Physical Exam She is alert and orient x3 and in no acute distress Ortho Exam Examination of her right shoulder shows significant stiffness with abduction.  She is using her deltoids more to abduct her shoulder so there is weakness as well.  There is stiffness with internal rotation and adduction as well as external rotation.  Examination of her right operative knee shows she lacks full extension by a few degrees of flexion is just at 100.  The knee feels loosely stable.  The incisions well-healed.  Calf is soft. Specialty Comments:  No specialty comments available.  Imaging: No results found.   PMFS History: Patient Active Problem List   Diagnosis Date Noted  . Unilateral primary osteoarthritis, right knee  12/10/2018  . Status post total right knee replacement 12/10/2018  . Long term (current) use of anticoagulants 08/16/2018  . Hypertensive cardiovascular disease 06/04/2018  . Morbid obesity with BMI of 40.0-44.9, adult ([place]) 06/04/2018  . Fatigue 06/04/2018  . Chronic anticoagulation   . Atrial fibrillation and flutter ([place]) 05/02/2018  . Atrial fibrillation with RVR ([place]) 05/02/2018  . PAF (paroxysmal atrial fibrillation) ([place]) 05/02/2018  . Primary osteoarthritis of right knee 06/14/2017  . OSA (obstructive sleep apnea) 02/13/2017  . NIDDM-type 2 10/31/2016  . Essential hypertension 08/02/2016  . Anxiety and depression 08/02/2016  . Insomnia 08/02/2016  . Arthritis of left knee  10/13/2014  . Status post total left knee replacement 10/13/2014   Past Medical History:  Diagnosis Date  . Anxiety   . Arthritis    "knees; right thumb" (10/14/2014)  . Basal cell carcinoma    "burned off upper lip & right shoulder"  . Chronic anticoagulation    CHADS VASC=3  . Complication of anesthesia    after spinal anesthesia for left knee replacement, bladder did "not wake up". Was incontinent for 4 days post surgery  . Depression   . Fibroid tumor   . Frequent diarrhea   . GERD (gastroesophageal reflux disease)   . High cholesterol   . Hypertension    EF 65-70% grade 2DD  . Insomnia   . Migraine    "frequently when I was younger; maybe monthly now" (10/14/2014) - none after hysterectomy  . PAF (paroxysmal atrial fibrillation) ([place]) 05/02/2018   converted with rate control  . Pernicious anemia    pt not aware of this  . Sleep apnea    uses a cpap  . Type II diabetes mellitus ([place])   . Vitamin B 12 deficiency     Family History  Problem Relation Age of Onset  . Arthritis Mother   . Hyperlipidemia Mother   . Diabetes Mother   . Hypertension Mother   . Arthritis Father   . Hyperlipidemia Father   . Heart disease Father   . Diabetes Father   . Stroke Brother   . Hypertension Brother   . Diabetes Brother   . Arthritis Maternal Grandmother   . Hyperlipidemia Maternal Grandmother   . Diabetes Maternal Grandmother   . Hypertension Maternal Grandmother   . Arthritis Maternal Grandfather   . Hyperlipidemia Maternal Grandfather   . Hypertension Maternal Grandfather   . Arthritis Paternal Grandmother   . Hyperlipidemia Paternal Grandmother   . Diabetes Paternal Grandmother   . Hypertension Paternal Grandmother   . Arthritis Paternal Grandfather   . Hyperlipidemia Paternal Grandfather   . Diabetes Paternal Grandfather   . Hypertension Paternal Grandfather     Past Surgical History:  Procedure Laterality Date  . ABDOMINAL HYSTERECTOMY  1988  . ACHILLES TENDON  SURGERY Right   . APPENDECTOMY  1988  . BILATERAL OOPHORECTOMY  2004  . CARPAL TUNNEL RELEASE Bilateral 1986  . CATARACT EXTRACTION W/ INTRAOCULAR LENS  IMPLANT, BILATERAL Bilateral 2011  . COLONOSCOPY    . JOINT REPLACEMENT    . SHOULDER ARTHROSCOPY DISTAL CLAVICLE EXCISION AND OPEN ROTATOR CUFF REPAIR Right 11/2013  . TOENAIL EXCISION Right 04/2018   "big toe"  . TONSILLECTOMY  1950's  . TOTAL KNEE ARTHROPLASTY Left 10/13/2014   Procedure: LEFT TOTAL KNEE ARTHROPLASTY;  Surgeon: Mcarthur Rossetti, MD;  Location: [facility];  Service: Orthopedics;  Laterality: Left;  . TOTAL KNEE ARTHROPLASTY Right 12/10/2018   Procedure: RIGHT TOTAL KNEE ARTHROPLASTY;  Surgeon: Mcarthur Rossetti, MD;  Location: [facility];  Service: Orthopedics;  Laterality: Right;   Social History   Occupational History  . Not on file  Tobacco Use  . Smoking status: Never Smoker  . Smokeless tobacco: Never Used  Substance and Sexual Activity  . Alcohol use: Yes    Comment: 10/14/2014 "might have a drink when I'm out once/month"  . Drug use: No  . Sexual activity: Not Currently

## 2019-01-30 NOTE — Patient Instructions (Addendum)
Access Code: [email or web address]  URL: [email or web address]  Date: 01/30/2019  Prepared by: Lum Babe   Exercises  Standing Shoulder Internal Rotation Stretch with Hands Behind Back - 10 reps - 1 sets - 5 hold - 2x daily - 7x weekly  Doorway Pec Stretch at 90 Degrees Abduction - 10 reps - 1 sets - 10 hold - 2x daily - 7x weekly  Also gave her the low load long duration stretch for flexion as she is very stiff today

## 2019-01-30 NOTE — Care Plan (Signed)
RNCM met with patient while in office for 6 week follow up with MD. She overall feels she is doing well, but is having some unrelated pain in the shoulder that is addressed by MD today as well. With regards to the Right Total knee replacement, she feels she is much improved. Ambulates into clinic without any assistive devices. Continues to attend OPPT, which has been slightly delayed due to Covid-19. She does have an appointment today with PT. MD provided order for therapy to begin for the shoulder as well. She will continue with therapy for the knee to work on improving range of motion. Follow up in 1 month on 02/26/19 with Dr. Ninfa Linden. RNCM mailed a 30 day survey to patient's home in early April, but she states she did not receive. Provided one today in office and completed. See Ortho Bundle flow sheet for results.

## 2019-01-30 NOTE — Therapy (Signed)
[facility] [place], [place], [address] Phone: [phone]   Fax:  [phone]  Physical Therapy Evaluation  Patient Details  Name: Debra Wright MRN: [id number] Date of Birth: 16-Jun-1951 Referring Provider (PT): Hedy Camara Date: 01/30/2019  PT End of Session - 01/30/19 1405    Visit Number  11    Date for PT Re-Evaluation  02/24/19    Authorization Type  Medicare    PT Start Time  1314    PT Stop Time  1400    PT Time Calculation (min)  46 min    Activity Tolerance  Patient tolerated treatment well    Behavior During Therapy  [facility] for tasks assessed/performed       Past Medical History:  Diagnosis Date  . Anxiety   . Arthritis    "knees; right thumb" (10/14/2014)  . Basal cell carcinoma    "burned off upper lip & right shoulder"  . Chronic anticoagulation    CHADS VASC=3  . Complication of anesthesia    after spinal anesthesia for left knee replacement, bladder did "not wake up". Was incontinent for 4 days post surgery  . Depression   . Fibroid tumor   . Frequent diarrhea   . GERD (gastroesophageal reflux disease)   . High cholesterol   . Hypertension    EF 65-70% grade 2DD  . Insomnia   . Migraine    "frequently when I was younger; maybe monthly now" (10/14/2014) - none after hysterectomy  . PAF (paroxysmal atrial fibrillation) ([place]) 05/02/2018   converted with rate control  . Pernicious anemia    pt not aware of this  . Sleep apnea    uses a cpap  . Type II diabetes mellitus ([place])   . Vitamin B 12 deficiency     Past Surgical History:  Procedure Laterality Date  . ABDOMINAL HYSTERECTOMY  1988  . ACHILLES TENDON SURGERY Right   . APPENDECTOMY  1988  . BILATERAL OOPHORECTOMY  2004  . CARPAL TUNNEL RELEASE Bilateral 1986  . CATARACT EXTRACTION W/ INTRAOCULAR LENS  IMPLANT, BILATERAL Bilateral 2011  . COLONOSCOPY    . JOINT REPLACEMENT    . SHOULDER ARTHROSCOPY DISTAL  CLAVICLE EXCISION AND OPEN ROTATOR CUFF REPAIR Right 11/2013  . TOENAIL EXCISION Right 04/2018   "big toe"  . TONSILLECTOMY  1950's  . TOTAL KNEE ARTHROPLASTY Left 10/13/2014   Procedure: LEFT TOTAL KNEE ARTHROPLASTY;  Surgeon: Mcarthur Rossetti, MD;  Location: [facility];  Service: Orthopedics;  Laterality: Left;  . TOTAL KNEE ARTHROPLASTY Right 12/10/2018   Procedure: RIGHT TOTAL KNEE ARTHROPLASTY;  Surgeon: Mcarthur Rossetti, MD;  Location: [facility];  Service: Orthopedics;  Laterality: Right;    There were no vitals filed for this visit.   Subjective Assessment - 01/30/19 1318    Subjective  Patient saw the MD today, he fells like the knee is going very well.  Pleased with the scar and the ROM.  She brings in a script for right shoulder pain and stiffness, she is unsure of a cause, but reports that over the psat 6 months she has lost a lot of ROM and is now having difficulty with dressing and doing hair.    Pertinent History  had a right RC repair 5 years ago, R Achilles tendon repair 05/01/16; L TKR 10/13/14; R knee OA in need of TKR    Patient Stated Goals  I would like to be able  to do my hair and dress easier, she also reports at times being unable to get the fork to her mouth due to pain and weakness    Currently in Pain?  Yes    Pain Score  3     Pain Location  Shoulder    Pain Orientation  Right    Pain Descriptors / Indicators  Aching;Tightness    Pain Type  Acute pain    Pain Onset  More than a month ago    Pain Frequency  Constant    Aggravating Factors   reaching, dressing, doing hair, eating, pain can be up to 8/10    Pain Relieving Factors  rest, pain medication pain can be 1/10    Effect of Pain on Daily Activities  dressing, hair, makeup and eating         [facility] PT Assessment - 01/30/19 0001      Assessment   Medical Diagnosis  right shoulder pain    Referring Provider (PT)  Ninfa Linden    Onset Date/Surgical Date  01/29/19      Posture/Postural Control   Posture  Comments  rounded shoulders, fwd head      AROM   AROM Assessment Site  Shoulder    Right/Left Shoulder  Right    Right Shoulder Flexion  130 Degrees    Right Shoulder ABduction  130 Degrees    Right Shoulder Internal Rotation  30 Degrees    Right Shoulder External Rotation  5 Degrees      PROM   PROM Assessment Site  Shoulder    Right/Left Shoulder  Right    Right Shoulder Flexion  140 Degrees    Right Shoulder ABduction  135 Degrees    Right Shoulder Internal Rotation  35 Degrees    Right Shoulder External Rotation  10 Degrees   very painful     Strength   Strength Assessment Site  Shoulder    Right/Left Shoulder  Right    Right Shoulder Flexion  3+/5   pain   Right Shoulder ABduction  3+/5    Right Shoulder Internal Rotation  4-/5    Right Shoulder External Rotation  3+/5      Palpation   Palpation comment  tender to touch in the tight shoulder and the right upper trap      Special Tests   Other special tests  + impingement, pain with empty can but can get in that positsion and give resistance                Objective measurements completed on examination: See above findings.      [facility] Adult PT Treatment/Exercise - 01/30/19 0001      Knee/Hip Exercises: Aerobic   Recumbent Bike  full revolutions but heavy lean to L  x 3 min     Nustep  L4 x 6 min                PT Short Term Goals - 01/01/19 1142      PT SHORT TERM GOAL #1   Title  Independent with initial HEP     Status  Achieved        PT Long Term Goals - 01/30/19 1411      Additional Long Term Goals   Additional Long Term Goals  Yes      PT LONG TERM GOAL #7   Title  increase ER of the right shoulder to 50 degrees    Time  [place] - 01/30/19 1405    Clinical Impression Statement  Patient reports that today is the first day she has been this active and is very tired and the knee is very stiff, reports that she went to the MD  and to the store.  She comes in today with an order for PT to evaluate and treat right shoulder pain and stiffness.  She has very limited ROM with ER.  this has caused her difficulty with dressing, doing hair and makeup and eating,  she is unsure of any specific reason for the pain and stiffness.  We will start treating the shoulder ROM and continue to work on the knee    PT Next Visit Plan  will add treatment for shoulder and continue with the knee    Consulted and Agree with Plan of Care  Patient       Patient will benefit from skilled therapeutic intervention in order to improve the following deficits and impairments:  Abnormal gait, Cardiopulmonary status limiting activity, Decreased activity tolerance, Decreased balance, Difficulty walking, Increased edema, Impaired flexibility, Decreased strength, Decreased range of motion, Decreased endurance, Decreased mobility, Decreased scar mobility, Pain, Increased muscle spasms, Impaired UE functional use  Visit Diagnosis: Acute pain of right shoulder - Plan: PT plan of care cert/re-cert  Stiffness of right shoulder, not elsewhere classified - Plan: PT plan of care cert/re-cert     Problem List Patient Active Problem List   Diagnosis Date Noted  . Unilateral primary osteoarthritis, right knee 12/10/2018  . Status post total right knee replacement 12/10/2018  . Long term (current) use of anticoagulants 08/16/2018  . Hypertensive cardiovascular disease 06/04/2018  . Morbid obesity with BMI of 40.0-44.9, adult ([place]) 06/04/2018  . Fatigue 06/04/2018  . Chronic anticoagulation   . Atrial fibrillation and flutter ([place]) 05/02/2018  . Atrial fibrillation with RVR ([place]) 05/02/2018  . PAF (paroxysmal atrial fibrillation) ([place]) 05/02/2018  . Primary osteoarthritis of right knee 06/14/2017  . OSA (obstructive sleep apnea) 02/13/2017  . NIDDM-type 2 10/31/2016  . Essential hypertension 08/02/2016  . Anxiety and depression 08/02/2016  . Insomnia  08/02/2016  . Arthritis of left knee 10/13/2014  . Status post total left knee replacement 10/13/2014    Sumner Boast., PT 01/30/2019, 2:14 PM  [facility] [address] [place], [place], [address] Phone: [phone]   Fax:  [phone]  Name: Ortha Metts MRN: [id number] Date of Birth: 19-May-1951

## 2019-02-03 ENCOUNTER — Other Ambulatory Visit (INDEPENDENT_AMBULATORY_CARE_PROVIDER_SITE_OTHER): Payer: Self-pay | Admitting: Family Medicine

## 2019-02-04 ENCOUNTER — Ambulatory Visit: Payer: Medicare Other | Attending: Orthopaedic Surgery | Admitting: Physical Therapy

## 2019-02-04 ENCOUNTER — Other Ambulatory Visit: Payer: Self-pay

## 2019-02-04 ENCOUNTER — Encounter: Payer: Self-pay | Admitting: Physical Therapy

## 2019-02-04 DIAGNOSIS — R262 Difficulty in walking, not elsewhere classified: Secondary | ICD-10-CM | POA: Diagnosis not present

## 2019-02-04 DIAGNOSIS — M25511 Pain in right shoulder: Secondary | ICD-10-CM

## 2019-02-04 DIAGNOSIS — M25561 Pain in right knee: Secondary | ICD-10-CM

## 2019-02-04 DIAGNOSIS — M25661 Stiffness of right knee, not elsewhere classified: Secondary | ICD-10-CM | POA: Diagnosis not present

## 2019-02-04 DIAGNOSIS — R600 Localized edema: Secondary | ICD-10-CM

## 2019-02-04 DIAGNOSIS — M25611 Stiffness of right shoulder, not elsewhere classified: Secondary | ICD-10-CM

## 2019-02-04 NOTE — Therapy (Signed)
[facility] [place], [place], [address] Phone: [phone]   Fax:  [phone]  Physical Therapy Treatment  Patient Details  Name: Debra Wright MRN: [id number] Date of Birth: 08/05/1951 Referring Provider (PT): Hedy Camara Date: 02/04/2019  PT End of Session - 02/04/19 1346    Visit Number  12    Date for PT Re-Evaluation  02/24/19    Authorization Type  Medicare    PT Start Time  1300    PT Stop Time  1346    PT Time Calculation (min)  46 min       Past Medical History:  Diagnosis Date  . Anxiety   . Arthritis    "knees; right thumb" (10/14/2014)  . Basal cell carcinoma    "burned off upper lip & right shoulder"  . Chronic anticoagulation    CHADS VASC=3  . Complication of anesthesia    after spinal anesthesia for left knee replacement, bladder did "not wake up". Was incontinent for 4 days post surgery  . Depression   . Fibroid tumor   . Frequent diarrhea   . GERD (gastroesophageal reflux disease)   . High cholesterol   . Hypertension    EF 65-70% grade 2DD  . Insomnia   . Migraine    "frequently when I was younger; maybe monthly now" (10/14/2014) - none after hysterectomy  . PAF (paroxysmal atrial fibrillation) ([place]) 05/02/2018   converted with rate control  . Pernicious anemia    pt not aware of this  . Sleep apnea    uses a cpap  . Type II diabetes mellitus ([place])   . Vitamin B 12 deficiency     Past Surgical History:  Procedure Laterality Date  . ABDOMINAL HYSTERECTOMY  1988  . ACHILLES TENDON SURGERY Right   . APPENDECTOMY  1988  . BILATERAL OOPHORECTOMY  2004  . CARPAL TUNNEL RELEASE Bilateral 1986  . CATARACT EXTRACTION W/ INTRAOCULAR LENS  IMPLANT, BILATERAL Bilateral 2011  . COLONOSCOPY    . JOINT REPLACEMENT    . SHOULDER ARTHROSCOPY DISTAL CLAVICLE EXCISION AND OPEN ROTATOR CUFF REPAIR Right 11/2013  . TOENAIL EXCISION Right 04/2018   "big toe"  .  TONSILLECTOMY  1950's  . TOTAL KNEE ARTHROPLASTY Left 10/13/2014   Procedure: LEFT TOTAL KNEE ARTHROPLASTY;  Surgeon: Mcarthur Rossetti, MD;  Location: [facility];  Service: Orthopedics;  Laterality: Left;  . TOTAL KNEE ARTHROPLASTY Right 12/10/2018   Procedure: RIGHT TOTAL KNEE ARTHROPLASTY;  Surgeon: Mcarthur Rossetti, MD;  Location: [facility];  Service: Orthopedics;  Laterality: Right;    There were no vitals filed for this visit.  Subjective Assessment - 02/04/19 1304    Subjective  "Pretty good"    Currently in Pain?  No/denies                       [facility] Adult PT Treatment/Exercise - 02/04/19 0001      Exercises   Exercises  Shoulder      Knee/Hip Exercises: Aerobic   Recumbent Bike  full revolutions but heavy lean to L  x 2 min     Nustep  L4 x 4 min     Other Aerobic  UBE L3 x4 min       Knee/Hip Exercises: Machines for Strengthening   Cybex Knee Extension  10lb 2x15    Cybex Knee Flexion  25lb 2x15    Cybex Leg Press  40lb 2x10, RLE  20lb x10, X 5        Shoulder Exercises: Standing   External Rotation  Theraband;20 reps;Strengthening;Right    Theraband Level (Shoulder External Rotation)  Level 2 (Red)    Internal Rotation  Theraband;20 reps;Right;Strengthening    Theraband Level (Shoulder Internal Rotation)  Level 2 (Red)    Extension  Theraband;20 reps;Both;Strengthening    Theraband Level (Shoulder Extension)  Level 3 (Green)    Row  [employer];Both    Theraband Level (Shoulder Row)  Level 3 (Green)      Manual Therapy   Manual Therapy  Passive ROM;Joint mobilization    Joint Mobilization  R HG grades 2-3    Passive ROM  R shoulder all directions.               PT Short Term Goals - 01/01/19 1142      PT SHORT TERM GOAL #1   Title  Independent with initial HEP     Status  Achieved        PT Long Term Goals - 01/30/19 1411      Additional Long Term Goals   Additional Long Term Goals  Yes      PT LONG TERM GOAL #7    Title  increase ER of the right shoulder to 50 degrees    Time  8    Period  Weeks    Status  New            Plan - 02/04/19 1349    Clinical Impression Statement  Pt demo ed goods strength and RM with all LW activities. She did reports some pain on the leg extension machine with the R knee fully flexed. Anterior R shoulder pulling with standing rows and R shoulder ER. ER was very limited with the exercises causing pt to use her posture to compensate. Positive response with R shoulder JT mobes evident by increase E shoulder ROM and muscle tissue elasticity.    Personal Factors and Comorbidities  Comorbidity 3+;Fitness    Comorbidities  DM, anxiety, depression, past achiiles rupture    Examination-Activity Limitations  Bed Mobility;Stairs;Squat;Locomotion Level;Stand;Toileting;Transfers    Examination-Participation Restrictions  Shop;Cleaning;Driving    Stability/Clinical Decision Making  Evolving/Moderate complexity    Rehab Potential  Good    PT Frequency  3x / week    PT Duration  8 weeks    PT Treatment/Interventions  ADLs/Self Care Home Management;Cryotherapy;Electrical Stimulation;Functional mobility training;Stair training;Gait training;Therapeutic activities;Therapeutic exercise;Balance training;Neuromuscular re-education;Manual techniques;Patient/family education;Vasopneumatic Device    PT Next Visit Plan   treatment for shoulder and continue with the knee       Patient will benefit from skilled therapeutic intervention in order to improve the following deficits and impairments:  Abnormal gait, Cardiopulmonary status limiting activity, Decreased activity tolerance, Decreased balance, Difficulty walking, Increased edema, Impaired flexibility, Decreased strength, Decreased range of motion, Decreased endurance, Decreased mobility, Decreased scar mobility, Pain, Increased muscle spasms, Impaired UE functional use  Visit Diagnosis: Acute pain of right shoulder  Stiffness of right  shoulder, not elsewhere classified  Localized edema  Acute pain of right knee  Stiffness of right knee, not elsewhere classified  Difficulty in walking, not elsewhere classified     Problem List Patient Active Problem List   Diagnosis Date Noted  . Unilateral primary osteoarthritis, right knee 12/10/2018  . Status post total right knee replacement 12/10/2018  . Long term (current) use of anticoagulants 08/16/2018  . Hypertensive cardiovascular disease 06/04/2018  . Morbid  obesity with BMI of 40.0-44.9, adult ([place]) 06/04/2018  . Fatigue 06/04/2018  . Chronic anticoagulation   . Atrial fibrillation and flutter ([place]) 05/02/2018  . Atrial fibrillation with RVR ([place]) 05/02/2018  . PAF (paroxysmal atrial fibrillation) ([place]) 05/02/2018  . Primary osteoarthritis of right knee 06/14/2017  . OSA (obstructive sleep apnea) 02/13/2017  . NIDDM-type 2 10/31/2016  . Essential hypertension 08/02/2016  . Anxiety and depression 08/02/2016  . Insomnia 08/02/2016  . Arthritis of left knee 10/13/2014  . Status post total left knee replacement 10/13/2014    Scot Jun, PTA 02/04/2019, 1:52 PM  [facility], [place], [address] Phone: [phone]   Fax:  [phone]  Name: Atoya Andrew MRN: [id number] Date of Birth: 1950-11-04

## 2019-02-05 ENCOUNTER — Encounter (INDEPENDENT_AMBULATORY_CARE_PROVIDER_SITE_OTHER): Payer: Self-pay | Admitting: Family Medicine

## 2019-02-05 MED ORDER — METFORMIN HCL 1000 MG PO TABS: 1000.0000 mg | ORAL_TABLET | Freq: Two times a day (BID) | ORAL | 3 refills | Status: DC

## 2019-02-06 ENCOUNTER — Other Ambulatory Visit: Payer: Self-pay

## 2019-02-06 ENCOUNTER — Encounter: Payer: Self-pay | Admitting: Physical Therapy

## 2019-02-06 ENCOUNTER — Ambulatory Visit: Payer: Medicare Other | Admitting: Physical Therapy

## 2019-02-06 DIAGNOSIS — R600 Localized edema: Secondary | ICD-10-CM

## 2019-02-06 DIAGNOSIS — M25611 Stiffness of right shoulder, not elsewhere classified: Secondary | ICD-10-CM | POA: Diagnosis not present

## 2019-02-06 DIAGNOSIS — M25511 Pain in right shoulder: Secondary | ICD-10-CM | POA: Diagnosis not present

## 2019-02-06 DIAGNOSIS — M25661 Stiffness of right knee, not elsewhere classified: Secondary | ICD-10-CM

## 2019-02-06 DIAGNOSIS — M5116 Intervertebral disc disorders with radiculopathy, lumbar region: Secondary | ICD-10-CM | POA: Diagnosis not present

## 2019-02-06 DIAGNOSIS — M48061 Spinal stenosis, lumbar region without neurogenic claudication: Secondary | ICD-10-CM | POA: Diagnosis not present

## 2019-02-06 DIAGNOSIS — M25561 Pain in right knee: Secondary | ICD-10-CM

## 2019-02-06 DIAGNOSIS — R262 Difficulty in walking, not elsewhere classified: Secondary | ICD-10-CM

## 2019-02-06 DIAGNOSIS — M4726 Other spondylosis with radiculopathy, lumbar region: Secondary | ICD-10-CM | POA: Diagnosis not present

## 2019-02-06 NOTE — Therapy (Signed)
[place], [place], [address] Phone: [phone]   Fax:  [phone]  Physical Therapy Treatment  Patient Details  Name: Debra Wright MRN: [id number] Date of Birth: August 15, 1951 Referring Provider (PT): Ninfa Linden   Encounter Date: 02/06/2019  PT End of Session - 02/06/19 1350    Visit Number  13    Date for PT Re-Evaluation  02/24/19    Authorization Type  Medicare    PT Start Time  1302    PT Stop Time  1345    PT Time Calculation (min)  43 min    Activity Tolerance  Patient tolerated treatment well    Behavior During Therapy  [facility] for tasks assessed/performed       Past Medical History:  Diagnosis Date  . Anxiety   . Arthritis    "knees; right thumb" (10/14/2014)  . Basal cell carcinoma    "burned off upper lip & right shoulder"  . Chronic anticoagulation    CHADS VASC=3  . Complication of anesthesia    after spinal anesthesia for left knee replacement, bladder did "not wake up". Was incontinent for 4 days post surgery  . Depression   . Fibroid tumor   . Frequent diarrhea   . GERD (gastroesophageal reflux disease)   . High cholesterol   . Hypertension    EF 65-70% grade 2DD  . Insomnia   . Migraine    "frequently when I was younger; maybe monthly now" (10/14/2014) - none after hysterectomy  . PAF (paroxysmal atrial fibrillation) ([place]) 05/02/2018   converted with rate control  . Pernicious anemia    pt not aware of this  . Sleep apnea    uses a cpap  . Type II diabetes mellitus ([place])   . Vitamin B 12 deficiency     Past Surgical History:  Procedure Laterality Date  . ABDOMINAL HYSTERECTOMY  1988  . ACHILLES TENDON SURGERY Right   . APPENDECTOMY  1988  . BILATERAL OOPHORECTOMY  2004  . CARPAL TUNNEL RELEASE Bilateral 1986  . CATARACT EXTRACTION W/ INTRAOCULAR LENS  IMPLANT, BILATERAL Bilateral 2011  . COLONOSCOPY    . JOINT REPLACEMENT    . SHOULDER ARTHROSCOPY DISTAL  CLAVICLE EXCISION AND OPEN ROTATOR CUFF REPAIR Right 11/2013  . TOENAIL EXCISION Right 04/2018   "big toe"  . TONSILLECTOMY  1950's  . TOTAL KNEE ARTHROPLASTY Left 10/13/2014   Procedure: LEFT TOTAL KNEE ARTHROPLASTY;  Surgeon: Mcarthur Rossetti, MD;  Location: [facility];  Service: Orthopedics;  Laterality: Left;  . TOTAL KNEE ARTHROPLASTY Right 12/10/2018   Procedure: RIGHT TOTAL KNEE ARTHROPLASTY;  Surgeon: Mcarthur Rossetti, MD;  Location: [facility];  Service: Orthopedics;  Laterality: Right;    There were no vitals filed for this visit.  Subjective Assessment - 02/06/19 1308    Subjective  "i am fine"    Currently in Pain?  No/denies    Pain Score  0-No pain                       [facility] Adult PT Treatment/Exercise - 02/06/19 0001      Ambulation/Gait   Stairs  Yes    Stairs Assistance  6: Modified independent (Device/Increase time);5: Supervision    Stair Management Technique  One rail Right;Alternating pattern    Number of Stairs  24    Height of Stairs  6    Gait Comments  Eccentric load weakness the  RLE       Knee/Hip Exercises: Aerobic   Nustep  L4 x 4 min     Other Aerobic  UBE L3 x4 min       Knee/Hip Exercises: Machines for Strengthening   Cybex Leg Press  50lb 2x10      Shoulder Exercises: Supine   Other Supine Exercises  RUE ER/IR 2x10 2lb      Shoulder Exercises: Standing   Flexion  20 reps;Both;Weights;5 reps    Flexion Limitations  1    ABduction  Theraband;10 reps;5 reps;Both;Strengthening    Shoulder ABduction Weight (lbs)  1    Extension  Theraband;20 reps;Both;Strengthening    Theraband Level (Shoulder Extension)  Level 4 (Blue)    Row  [employer];Both    Theraband Level (Shoulder Row)  Level 4 (Blue)      Shoulder Exercises: ROM/Strengthening   Other ROM/Strengthening Exercises  Rows and lats 2x10       Manual Therapy   Manual Therapy  Passive ROM;Joint mobilization    Joint Mobilization  R HG grades 2-3    Passive ROM   R shoulder all directions.               PT Short Term Goals - 01/01/19 1142      PT SHORT TERM GOAL #1   Title  Independent with initial HEP     Status  Achieved        PT Long Term Goals - 01/30/19 1411      Additional Long Term Goals   Additional Long Term Goals  Yes      PT LONG TERM GOAL #7   Title  increase ER of the right shoulder to 50 degrees    Time  8    Period  Weeks    Status  New            Plan - 02/06/19 1352    Clinical Impression Statement  Overall pt did really well with the activities. She did have some LE weakness with stair negotiation. A pulling sensation in her lateral delt with active shoulder flexion cause limited motion and for her to bend her elbow.  Progressed to seated Rows and Lat pull downs. R shoulder IR/ER are mostly limited with MT. Responds well to jt mobs.    Comorbidities  DM, anxiety, depression, past achiiles rupture    Examination-Activity Limitations  Bed Mobility;Stairs;Squat;Locomotion Level;Stand;Toileting;Transfers    Examination-Participation Restrictions  Shop;Cleaning;Driving    Rehab Potential  Good    PT Frequency  3x / week    PT Duration  8 weeks    PT Treatment/Interventions  ADLs/Self Care Home Management;Cryotherapy;Electrical Stimulation;Functional mobility training;Stair training;Gait training;Therapeutic activities;Therapeutic exercise;Balance training;Neuromuscular re-education;Manual techniques;Patient/family education;Vasopneumatic Device    PT Next Visit Plan   treatment for shoulder and continue with the knee       Patient will benefit from skilled therapeutic intervention in order to improve the following deficits and impairments:  Abnormal gait, Cardiopulmonary status limiting activity, Decreased activity tolerance, Decreased balance, Difficulty walking, Increased edema, Impaired flexibility, Decreased strength, Decreased range of motion, Decreased endurance, Decreased mobility, Decreased scar  mobility, Pain, Increased muscle spasms, Impaired UE functional use  Visit Diagnosis: Acute pain of right shoulder  Stiffness of right shoulder, not elsewhere classified  Localized edema  Stiffness of right knee, not elsewhere classified  Difficulty in walking, not elsewhere classified  Acute pain of right knee     Problem List Patient Active Problem List  Diagnosis Date Noted  . Unilateral primary osteoarthritis, right knee 12/10/2018  . Status post total right knee replacement 12/10/2018  . Long term (current) use of anticoagulants 08/16/2018  . Hypertensive cardiovascular disease 06/04/2018  . Morbid obesity with BMI of 40.0-44.9, adult ([place]) 06/04/2018  . Fatigue 06/04/2018  . Chronic anticoagulation   . Atrial fibrillation and flutter ([place]) 05/02/2018  . Atrial fibrillation with RVR ([place]) 05/02/2018  . PAF (paroxysmal atrial fibrillation) ([place]) 05/02/2018  . Primary osteoarthritis of right knee 06/14/2017  . OSA (obstructive sleep apnea) 02/13/2017  . NIDDM-type 2 10/31/2016  . Essential hypertension 08/02/2016  . Anxiety and depression 08/02/2016  . Insomnia 08/02/2016  . Arthritis of left knee 10/13/2014  . Status post total left knee replacement 10/13/2014    Scot Jun, PTA 02/06/2019, 2:06 PM  [place], [place], [address] Phone: [phone]   Fax:  [phone]  Name: Enzley Kitchens MRN: [id number] Date of Birth: [birth year]

## 2019-02-11 ENCOUNTER — Telehealth: Payer: Self-pay

## 2019-02-11 ENCOUNTER — Other Ambulatory Visit: Payer: Self-pay

## 2019-02-11 ENCOUNTER — Ambulatory Visit: Payer: Medicare Other | Admitting: Physical Therapy

## 2019-02-11 ENCOUNTER — Encounter: Payer: Self-pay | Admitting: Physical Therapy

## 2019-02-11 DIAGNOSIS — R600 Localized edema: Secondary | ICD-10-CM

## 2019-02-11 DIAGNOSIS — M25561 Pain in right knee: Secondary | ICD-10-CM | POA: Diagnosis not present

## 2019-02-11 DIAGNOSIS — M25511 Pain in right shoulder: Secondary | ICD-10-CM | POA: Diagnosis not present

## 2019-02-11 DIAGNOSIS — M25661 Stiffness of right knee, not elsewhere classified: Secondary | ICD-10-CM

## 2019-02-11 DIAGNOSIS — R262 Difficulty in walking, not elsewhere classified: Secondary | ICD-10-CM | POA: Diagnosis not present

## 2019-02-11 DIAGNOSIS — M25611 Stiffness of right shoulder, not elsewhere classified: Secondary | ICD-10-CM

## 2019-02-11 NOTE — Therapy (Signed)
[place], [place], [address] Phone: [phone]   Fax:  [phone]  Physical Therapy Treatment  Patient Details  Name: Debra Wright MRN: [id number] Date of Birth: [birth year] Referring Provider (PT): Ninfa Linden   Encounter Date: 02/11/2019  PT End of Session - 02/11/19 1346    Visit Number  14    Date for PT Re-Evaluation  02/24/19    Authorization Type  Medicare    PT Start Time  1300    PT Stop Time  1345    PT Time Calculation (min)  45 min       Past Medical History:  Diagnosis Date  . Anxiety   . Arthritis    "knees; right thumb" (10/14/2014)  . Basal cell carcinoma    "burned off upper lip & right shoulder"  . Chronic anticoagulation    CHADS VASC=3  . Complication of anesthesia    after spinal anesthesia for left knee replacement, bladder did "not wake up". Was incontinent for 4 days post surgery  . Depression   . Fibroid tumor   . Frequent diarrhea   . GERD (gastroesophageal reflux disease)   . High cholesterol   . Hypertension    EF 65-70% grade 2DD  . Insomnia   . Migraine    "frequently when I was younger; maybe monthly now" (10/14/2014) - none after hysterectomy  . PAF (paroxysmal atrial fibrillation) ([place]) 05/02/2018   converted with rate control  . Pernicious anemia    pt not aware of this  . Sleep apnea    uses a cpap  . Type II diabetes mellitus ([place])   . Vitamin B 12 deficiency     Past Surgical History:  Procedure Laterality Date  . ABDOMINAL HYSTERECTOMY  1988  . ACHILLES TENDON SURGERY Right   . APPENDECTOMY  1988  . BILATERAL OOPHORECTOMY  2004  . CARPAL TUNNEL RELEASE Bilateral 1986  . CATARACT EXTRACTION W/ INTRAOCULAR LENS  IMPLANT, BILATERAL Bilateral 2011  . COLONOSCOPY    . JOINT REPLACEMENT    . SHOULDER ARTHROSCOPY DISTAL CLAVICLE EXCISION AND OPEN ROTATOR CUFF REPAIR Right 11/2013  . TOENAIL EXCISION Right 04/2018   "big toe"  .  TONSILLECTOMY  1950's  . TOTAL KNEE ARTHROPLASTY Left 10/13/2014   Procedure: LEFT TOTAL KNEE ARTHROPLASTY;  Surgeon: Mcarthur Rossetti, MD;  Location: [facility];  Service: Orthopedics;  Laterality: Left;  . TOTAL KNEE ARTHROPLASTY Right 12/10/2018   Procedure: RIGHT TOTAL KNEE ARTHROPLASTY;  Surgeon: Mcarthur Rossetti, MD;  Location: [facility];  Service: Orthopedics;  Laterality: Right;    There were no vitals filed for this visit.  Subjective Assessment - 02/11/19 1304    Subjective  "I am good, The knee is about a 3, My shoulder is sore but I do have more flexibility"    Currently in Pain?  Yes    Pain Score  3     Pain Location  Knee    Pain Orientation  Right                       [facility] Adult PT Treatment/Exercise - 02/11/19 0001      Knee/Hip Exercises: Aerobic   Nustep  L4 x 5 min     Other Aerobic  UBE L3 x4 min       Knee/Hip Exercises: Machines for Strengthening   Cybex Knee Extension  10lb 2x15, RLE 5lb 2x10  Cybex Knee Flexion  25lb 2x15, RLE 20lb 2x10       Shoulder Exercises: Standing   Flexion  20 reps;Both;Weights;5 reps    Shoulder Flexion Weight (lbs)  1    ABduction  Theraband;10 reps;5 reps;Both;Strengthening    Shoulder ABduction Weight (lbs)  1      Shoulder Exercises: ROM/Strengthening   Other ROM/Strengthening Exercises  chest press 10lb 2x10     Other ROM/Strengthening Exercises  Rows and lats 20lb 2x10       Manual Therapy   Manual Therapy  Passive ROM;Joint mobilization    Joint Mobilization  R HG grades 2-3    Passive ROM  R shoulder all directions, R knee flex and ext               PT Short Term Goals - 01/01/19 1142      PT SHORT TERM GOAL #1   Title  Independent with initial HEP     Status  Achieved        PT Long Term Goals - 01/30/19 1411      Additional Long Term Goals   Additional Long Term Goals  Yes      PT LONG TERM GOAL #7   Title  increase ER of the right shoulder to 50 degrees    Time  8     Period  Weeks    Status  New            Plan - 02/11/19 1346    Clinical Impression Statement  Pt enters clinic reporting some knee stiffness but improved R shoulder mobility. She did demo some weakness with the LE interventions, and pain when the machine stretched her knee int flexion with knee extensions. She did well with UE exercises displaying good strength and ROM. Some R elbow bending with standing shoulder flexion. Pain reported at the end range of passive R knee flexion. Some limitation with ER & IR with passive R shoulder motions.     Comorbidities  DM, anxiety, depression, past achilles rupture    Examination-Activity Limitations  Bed Mobility;Stairs;Squat;Locomotion Level;Stand;Toileting;Transfers    Examination-Participation Restrictions  Shop;Cleaning;Driving    Stability/Clinical Decision Making  Evolving/Moderate complexity    Rehab Potential  Good    PT Duration  8 weeks    PT Treatment/Interventions  ADLs/Self Care Home Management;Cryotherapy;Electrical Stimulation;Functional mobility training;Stair training;Gait training;Therapeutic activities;Therapeutic exercise;Balance training;Neuromuscular re-education;Manual techniques;Patient/family education;Vasopneumatic Device    PT Next Visit Plan   treatment for shoulder and continue with the knee       Patient will benefit from skilled therapeutic intervention in order to improve the following deficits and impairments:     Visit Diagnosis: Stiffness of right shoulder, not elsewhere classified  Localized edema  Stiffness of right knee, not elsewhere classified  Acute pain of right shoulder     Problem List Patient Active Problem List   Diagnosis Date Noted  . Unilateral primary osteoarthritis, right knee 12/10/2018  . Status post total right knee replacement 12/10/2018  . Long term (current) use of anticoagulants 08/16/2018  . Hypertensive cardiovascular disease 06/04/2018  . Morbid obesity with BMI of  40.0-44.9, adult ([place]) 06/04/2018  . Fatigue 06/04/2018  . Chronic anticoagulation   . Atrial fibrillation and flutter ([place]) 05/02/2018  . Atrial fibrillation with RVR ([place]) 05/02/2018  . PAF (paroxysmal atrial fibrillation) ([place]) 05/02/2018  . Primary osteoarthritis of right knee 06/14/2017  . OSA (obstructive sleep apnea) 02/13/2017  . NIDDM-type 2 10/31/2016  . Essential hypertension 08/02/2016  .  Anxiety and depression 08/02/2016  . Insomnia 08/02/2016  . Arthritis of left knee 10/13/2014  . Status post total left knee replacement 10/13/2014    Scot Jun, PTA 02/11/2019, 1:51 PM  [place], [place], [address] Phone: [phone]   Fax:  [phone]  Name: Debra Wright MRN: [id number] Date of Birth: Feb 14, 1951

## 2019-02-11 NOTE — Telephone Encounter (Signed)
lmom for prescreen  

## 2019-02-11 NOTE — Telephone Encounter (Signed)

## 2019-02-12 ENCOUNTER — Ambulatory Visit (INDEPENDENT_AMBULATORY_CARE_PROVIDER_SITE_OTHER): Payer: Medicare Other | Admitting: *Deleted

## 2019-02-12 ENCOUNTER — Other Ambulatory Visit: Payer: Self-pay

## 2019-02-12 DIAGNOSIS — I4891 Unspecified atrial fibrillation: Secondary | ICD-10-CM

## 2019-02-12 DIAGNOSIS — Z7901 Long term (current) use of anticoagulants: Secondary | ICD-10-CM

## 2019-02-12 LAB — POCT INR: INR: 3.1 — AB (ref 2.0–3.0)

## 2019-02-13 ENCOUNTER — Ambulatory Visit: Payer: Medicare Other | Admitting: Physical Therapy

## 2019-02-13 ENCOUNTER — Encounter: Payer: Self-pay | Admitting: Physical Therapy

## 2019-02-13 ENCOUNTER — Other Ambulatory Visit: Payer: Self-pay

## 2019-02-13 DIAGNOSIS — M25611 Stiffness of right shoulder, not elsewhere classified: Secondary | ICD-10-CM

## 2019-02-13 DIAGNOSIS — M25511 Pain in right shoulder: Secondary | ICD-10-CM | POA: Diagnosis not present

## 2019-02-13 DIAGNOSIS — M25561 Pain in right knee: Secondary | ICD-10-CM | POA: Diagnosis not present

## 2019-02-13 DIAGNOSIS — M25661 Stiffness of right knee, not elsewhere classified: Secondary | ICD-10-CM

## 2019-02-13 DIAGNOSIS — R600 Localized edema: Secondary | ICD-10-CM

## 2019-02-13 DIAGNOSIS — R262 Difficulty in walking, not elsewhere classified: Secondary | ICD-10-CM | POA: Diagnosis not present

## 2019-02-13 NOTE — Therapy (Signed)
[place], [place], [address] Phone: [phone]   Fax:  [phone]  Physical Therapy Treatment  Patient Details  Name: Debra Wright MRN: [id number] Date of Birth: Apr 14, 1951 Referring Provider (PT): Ninfa Linden   Encounter Date: 02/13/2019  PT End of Session - 02/13/19 1343    Visit Number  15    Date for PT Re-Evaluation  02/24/19    Authorization Type  Medicare    PT Start Time  1300    PT Stop Time  1343    PT Time Calculation (min)  43 min    Activity Tolerance  Patient tolerated treatment well    Behavior During Therapy  [facility] for tasks assessed/performed       Past Medical History:  Diagnosis Date  . Anxiety   . Arthritis    "knees; right thumb" (10/14/2014)  . Basal cell carcinoma    "burned off upper lip & right shoulder"  . Chronic anticoagulation    CHADS VASC=3  . Complication of anesthesia    after spinal anesthesia for left knee replacement, bladder did "not wake up". Was incontinent for 4 days post surgery  . Depression   . Fibroid tumor   . Frequent diarrhea   . GERD (gastroesophageal reflux disease)   . High cholesterol   . Hypertension    EF 65-70% grade 2DD  . Insomnia   . Migraine    "frequently when I was younger; maybe monthly now" (10/14/2014) - none after hysterectomy  . PAF (paroxysmal atrial fibrillation) ([place]) 05/02/2018   converted with rate control  . Pernicious anemia    pt not aware of this  . Sleep apnea    uses a cpap  . Type II diabetes mellitus ([place])   . Vitamin B 12 deficiency     Past Surgical History:  Procedure Laterality Date  . ABDOMINAL HYSTERECTOMY  1988  . ACHILLES TENDON SURGERY Right   . APPENDECTOMY  1988  . BILATERAL OOPHORECTOMY  2004  . CARPAL TUNNEL RELEASE Bilateral 1986  . CATARACT EXTRACTION W/ INTRAOCULAR LENS  IMPLANT, BILATERAL Bilateral 2011  . COLONOSCOPY    . JOINT REPLACEMENT    . SHOULDER ARTHROSCOPY DISTAL  CLAVICLE EXCISION AND OPEN ROTATOR CUFF REPAIR Right 11/2013  . TOENAIL EXCISION Right 04/2018   "big toe"  . TONSILLECTOMY  1950's  . TOTAL KNEE ARTHROPLASTY Left 10/13/2014   Procedure: LEFT TOTAL KNEE ARTHROPLASTY;  Surgeon: Mcarthur Rossetti, MD;  Location: [facility];  Service: Orthopedics;  Laterality: Left;  . TOTAL KNEE ARTHROPLASTY Right 12/10/2018   Procedure: RIGHT TOTAL KNEE ARTHROPLASTY;  Surgeon: Mcarthur Rossetti, MD;  Location: [facility];  Service: Orthopedics;  Laterality: Right;    There were no vitals filed for this visit.  Subjective Assessment - 02/13/19 1302    Subjective  "Doing ok"    Currently in Pain?  Yes    Pain Score  3     Pain Location  --   knee and shoulder   Pain Orientation  Right         [facility] PT Assessment - 02/13/19 0001      PROM   Right Knee Extension  8    Right Knee Flexion  93                   [facility] Adult PT Treatment/Exercise - 02/13/19 0001      Knee/Hip Exercises: Aerobic   Recumbent Bike  full revolutions seat 3 some L lean     Nustep  L4 x 5 min     Other Aerobic  UBE L3 x3 min       Knee/Hip Exercises: Machines for Strengthening   Cybex Leg Press  50lb 2x10      Knee/Hip Exercises: Standing   Forward Step Up  Right;Hand Hold: 0;Step Height: 6";10 reps;2 sets    Other Standing Knee Exercises  RLE TKE ball vs wall 2x10      Shoulder Exercises: Standing   External Rotation  Theraband;20 reps;Right;AROM    Internal Rotation  Theraband;20 reps;Right;Strengthening    Theraband Level (Shoulder Internal Rotation)  Level 2 (Red)      Shoulder Exercises: ROM/Strengthening   Other ROM/Strengthening Exercises  Rows and lats 20lb 2x15      Manual Therapy   Manual Therapy  Passive ROM;Joint mobilization    Joint Mobilization  R HG grades 2-3    Passive ROM  R shoulder all directions, R knee flex and ext               PT Short Term Goals - 01/01/19 1142      PT SHORT TERM GOAL #1   Title  Independent  with initial HEP     Status  Achieved        PT Long Term Goals - 01/30/19 1411      Additional Long Term Goals   Additional Long Term Goals  Yes      PT LONG TERM GOAL #7   Title  increase ER of the right shoulder to 50 degrees    Time  8    Period  Weeks    Status  New            Plan - 02/13/19 1344    Clinical Impression Statement  Pt reports more knee pain and stiffness. Some difficulty today with full revolutions on bike due to R knee stiffness. Some weakness noted in RLE with step up, pt unable to achieve her full available extension. Pt with the most difficulty with R shoulder external rotation, needing AAROM to externally rotate shoulder. Pt guarded with MT with both knee and shoulder, constant cues to relax.    Personal Factors and Comorbidities  Comorbidity 3+;Fitness    Comorbidities  DM, anxiety, depression, past achiiles rupture    Examination-Activity Limitations  Bed Mobility;Stairs;Squat;Locomotion Level;Stand;Toileting;Transfers    Examination-Participation Restrictions  Shop;Cleaning;Driving    Stability/Clinical Decision Making  Evolving/Moderate complexity    Rehab Potential  Good    PT Frequency  3x / week    PT Duration  8 weeks    PT Treatment/Interventions  ADLs/Self Care Home Management;Cryotherapy;Electrical Stimulation;Functional mobility training;Stair training;Gait training;Therapeutic activities;Therapeutic exercise;Balance training;Neuromuscular re-education;Manual techniques;Patient/family education;Vasopneumatic Device    PT Next Visit Plan   treatment for shoulder and continue with the knee       Patient will benefit from skilled therapeutic intervention in order to improve the following deficits and impairments:  Abnormal gait, Cardiopulmonary status limiting activity, Decreased activity tolerance, Decreased balance, Difficulty walking, Increased edema, Impaired flexibility, Decreased strength, Decreased range of motion, Decreased endurance,  Decreased mobility, Decreased scar mobility, Pain, Increased muscle spasms, Impaired UE functional use  Visit Diagnosis: Stiffness of right shoulder, not elsewhere classified  Localized edema  Stiffness of right knee, not elsewhere classified     Problem List Patient Active Problem List   Diagnosis Date Noted  . Unilateral primary osteoarthritis, right knee 12/10/2018  . Status  post total right knee replacement 12/10/2018  . Long term (current) use of anticoagulants 08/16/2018  . Hypertensive cardiovascular disease 06/04/2018  . Morbid obesity with BMI of 40.0-44.9, adult ([place]) 06/04/2018  . Fatigue 06/04/2018  . Chronic anticoagulation   . Atrial fibrillation and flutter ([place]) 05/02/2018  . Atrial fibrillation with RVR ([place]) 05/02/2018  . PAF (paroxysmal atrial fibrillation) ([place]) 05/02/2018  . Primary osteoarthritis of right knee 06/14/2017  . OSA (obstructive sleep apnea) 02/13/2017  . NIDDM-type 2 10/31/2016  . Essential hypertension 08/02/2016  . Anxiety and depression 08/02/2016  . Insomnia 08/02/2016  . Arthritis of left knee 10/13/2014  . Status post total left knee replacement 10/13/2014    Scot Jun, PTA 02/13/2019, 1:51 PM  [place], [place], [address] Phone: [phone]   Fax:  [phone]  Name: Debra Wright MRN: [id number] Date of Birth: 1950/11/01

## 2019-02-18 ENCOUNTER — Encounter: Payer: Self-pay | Admitting: Physical Therapy

## 2019-02-18 ENCOUNTER — Ambulatory Visit: Payer: Medicare Other | Admitting: Physical Therapy

## 2019-02-18 ENCOUNTER — Other Ambulatory Visit: Payer: Self-pay

## 2019-02-18 DIAGNOSIS — R600 Localized edema: Secondary | ICD-10-CM | POA: Diagnosis not present

## 2019-02-18 DIAGNOSIS — R262 Difficulty in walking, not elsewhere classified: Secondary | ICD-10-CM

## 2019-02-18 DIAGNOSIS — M25661 Stiffness of right knee, not elsewhere classified: Secondary | ICD-10-CM

## 2019-02-18 DIAGNOSIS — M25511 Pain in right shoulder: Secondary | ICD-10-CM | POA: Diagnosis not present

## 2019-02-18 DIAGNOSIS — M25611 Stiffness of right shoulder, not elsewhere classified: Secondary | ICD-10-CM | POA: Diagnosis not present

## 2019-02-18 DIAGNOSIS — M25561 Pain in right knee: Secondary | ICD-10-CM | POA: Diagnosis not present

## 2019-02-18 NOTE — Therapy (Signed)
[place], [place], [address] Phone: [phone]   Fax:  [phone]  Physical Therapy Treatment  Patient Details  Name: Debra Wright MRN: [id number] Date of Birth: 08-Sep-1951 Referring Provider (PT): Ninfa Linden   Encounter Date: 02/18/2019  PT End of Session - 02/18/19 1403    Visit Number  16    Date for PT Re-Evaluation  02/24/19    Authorization Type  Medicare    PT Start Time  1300    PT Stop Time  1400    PT Time Calculation (min)  60 min    Activity Tolerance  Patient tolerated treatment well    Behavior During Therapy  [facility] for tasks assessed/performed       Past Medical History:  Diagnosis Date  . Anxiety   . Arthritis    "knees; right thumb" (10/14/2014)  . Basal cell carcinoma    "burned off upper lip & right shoulder"  . Chronic anticoagulation    CHADS VASC=3  . Complication of anesthesia    after spinal anesthesia for left knee replacement, bladder did "not wake up". Was incontinent for 4 days post surgery  . Depression   . Fibroid tumor   . Frequent diarrhea   . GERD (gastroesophageal reflux disease)   . High cholesterol   . Hypertension    EF 65-70% grade 2DD  . Insomnia   . Migraine    "frequently when I was younger; maybe monthly now" (10/14/2014) - none after hysterectomy  . PAF (paroxysmal atrial fibrillation) ([place]) 05/02/2018   converted with rate control  . Pernicious anemia    pt not aware of this  . Sleep apnea    uses a cpap  . Type II diabetes mellitus ([place])   . Vitamin B 12 deficiency     Past Surgical History:  Procedure Laterality Date  . ABDOMINAL HYSTERECTOMY  1988  . ACHILLES TENDON SURGERY Right   . APPENDECTOMY  1988  . BILATERAL OOPHORECTOMY  2004  . CARPAL TUNNEL RELEASE Bilateral 1986  . CATARACT EXTRACTION W/ INTRAOCULAR LENS  IMPLANT, BILATERAL Bilateral 2011  . COLONOSCOPY    . JOINT REPLACEMENT    . SHOULDER ARTHROSCOPY DISTAL  CLAVICLE EXCISION AND OPEN ROTATOR CUFF REPAIR Right 11/2013  . TOENAIL EXCISION Right 04/2018   "big toe"  . TONSILLECTOMY  1950's  . TOTAL KNEE ARTHROPLASTY Left 10/13/2014   Procedure: LEFT TOTAL KNEE ARTHROPLASTY;  Surgeon: Mcarthur Rossetti, MD;  Location: [place];  Service: Orthopedics;  Laterality: Left;  . TOTAL KNEE ARTHROPLASTY Right 12/10/2018   Procedure: RIGHT TOTAL KNEE ARTHROPLASTY;  Surgeon: Mcarthur Rossetti, MD;  Location: [place];  Service: Orthopedics;  Laterality: Right;    There were no vitals filed for this visit.  Subjective Assessment - 02/18/19 1305    Subjective  No pain in shoulder, doing good    Currently in Pain?  Yes    Pain Score  3     Pain Location  Knee    Pain Orientation  Right                       [facility] Adult PT Treatment/Exercise - 02/18/19 0001      Ambulation/Gait   Stairs  Yes    Stairs Assistance  6: Modified independent (Device/Increase time);5: Supervision    Stair Management Technique  One rail Right;Alternating pattern    Number of Stairs  36  Height of Stairs  6    Gait Comments  Eccentric load weakness the RLE, fatigues quick then heavy UE use       Knee/Hip Exercises: Aerobic   Recumbent Bike  full revolutions seat 4 some L lean     Nustep  L4 x 5 min     Other Aerobic  UBE L3 x4 min       Knee/Hip Exercises: Machines for Strengthening   Cybex Knee Extension  10lb 2x15, RLE 5lb 2x15    Cybex Knee Flexion  25lb 2x15, RLE 20lb 2x10     Cybex Leg Press  40lb 2x15      Knee/Hip Exercises: Standing   Other Standing Knee Exercises  RLE TKE ball vs wall 2x10      Shoulder Exercises: ROM/Strengthening   Other ROM/Strengthening Exercises  chest press 10lb 2x15     Other ROM/Strengthening Exercises  Rows and lats 25lb 2x15      Manual Therapy   Manual Therapy  Passive ROM;Joint mobilization    Manual therapy comments  Pt very guarded with MT.     Joint Mobilization  R HG grades 2-3    Passive ROM  R  shoulder all directions, R knee flex and ext               PT Short Term Goals - 01/01/19 1142      PT SHORT TERM GOAL #1   Title  Independent with initial HEP     Status  Achieved        PT Long Term Goals - 01/30/19 1411      Additional Long Term Goals   Additional Long Term Goals  Yes      PT LONG TERM GOAL #7   Title  increase ER of the right shoulder to 50 degrees    Time  8    Period  Weeks    Status  New            Plan - 02/18/19 1403    Clinical Impression Statement  Pt did well with today's interventions. Increase reps tolerated with all LE interventions. She stated that her shoulder is doing really well with increase functional mobility. She does fatigue really quick  with stair negotiation. Heavy UE use needed due to fatigue. Pain with passive motions to knee  with firm end points to both flexion and extension. R shoulder AROM is well. She does have a hard time relaxing.      Personal Factors and Comorbidities  Comorbidity 3+;Fitness    Comorbidities  DM, anxiety, depression, past achiiles rupture    Examination-Activity Limitations  Bed Mobility;Stairs;Squat;Locomotion Level;Stand;Toileting;Transfers    Examination-Participation Restrictions  Shop;Cleaning;Driving    Stability/Clinical Decision Making  Evolving/Moderate complexity    PT Frequency  3x / week    PT Duration  8 weeks    PT Treatment/Interventions  ADLs/Self Care Home Management;Cryotherapy;Electrical Stimulation;Functional mobility training;Stair training;Gait training;Therapeutic activities;Therapeutic exercise;Balance training;Neuromuscular re-education;Manual techniques;Patient/family education;Vasopneumatic Device    PT Next Visit Plan   treatment for shoulder and continue with the knee       Patient will benefit from skilled therapeutic intervention in order to improve the following deficits and impairments:     Visit Diagnosis: Localized edema  Stiffness of right shoulder, not  elsewhere classified  Stiffness of right knee, not elsewhere classified  Acute pain of right shoulder  Difficulty in walking, not elsewhere classified  Acute pain of right knee     Problem List  Patient Active Problem List   Diagnosis Date Noted  . Unilateral primary osteoarthritis, right knee 12/10/2018  . Status post total right knee replacement 12/10/2018  . Long term (current) use of anticoagulants 08/16/2018  . Hypertensive cardiovascular disease 06/04/2018  . Morbid obesity with BMI of 40.0-44.9, adult ([place]) 06/04/2018  . Fatigue 06/04/2018  . Chronic anticoagulation   . Atrial fibrillation and flutter ([place]) 05/02/2018  . Atrial fibrillation with RVR ([place]) 05/02/2018  . PAF (paroxysmal atrial fibrillation) ([place]) 05/02/2018  . Primary osteoarthritis of right knee 06/14/2017  . OSA (obstructive sleep apnea) 02/13/2017  . NIDDM-type 2 10/31/2016  . Essential hypertension 08/02/2016  . Anxiety and depression 08/02/2016  . Insomnia 08/02/2016  . Arthritis of left knee 10/13/2014  . Status post total left knee replacement 10/13/2014    Scot Jun, PTA 02/18/2019, 2:06 PM  [place], [place], [address] Phone: [phone]   Fax:  [phone]  Name: Debra Wright MRN: [id number] Date of Birth: 1951/06/20

## 2019-02-20 ENCOUNTER — Encounter: Payer: Self-pay | Admitting: Physical Therapy

## 2019-02-20 ENCOUNTER — Other Ambulatory Visit: Payer: Self-pay

## 2019-02-20 ENCOUNTER — Other Ambulatory Visit (INDEPENDENT_AMBULATORY_CARE_PROVIDER_SITE_OTHER): Payer: Self-pay | Admitting: Family Medicine

## 2019-02-20 ENCOUNTER — Ambulatory Visit: Payer: Medicare Other | Admitting: Physical Therapy

## 2019-02-20 DIAGNOSIS — R600 Localized edema: Secondary | ICD-10-CM

## 2019-02-20 DIAGNOSIS — M25661 Stiffness of right knee, not elsewhere classified: Secondary | ICD-10-CM

## 2019-02-20 DIAGNOSIS — M25611 Stiffness of right shoulder, not elsewhere classified: Secondary | ICD-10-CM | POA: Diagnosis not present

## 2019-02-20 DIAGNOSIS — M25511 Pain in right shoulder: Secondary | ICD-10-CM | POA: Diagnosis not present

## 2019-02-20 DIAGNOSIS — M25561 Pain in right knee: Secondary | ICD-10-CM | POA: Diagnosis not present

## 2019-02-20 DIAGNOSIS — R262 Difficulty in walking, not elsewhere classified: Secondary | ICD-10-CM | POA: Diagnosis not present

## 2019-02-20 MED ORDER — CLONAZEPAM 0.5 MG PO TABS: ORAL_TABLET | ORAL | 0 refills | Status: DC

## 2019-02-20 NOTE — Telephone Encounter (Signed)
Dr. Junius Roads patient.

## 2019-02-20 NOTE — Therapy (Signed)
[place], [place], [address] Phone: [phone]   Fax:  [phone]  Physical Therapy Treatment  Patient Details  Name: Debra Wright MRN: [id number] Date of Birth: 1951-03-29 Referring Provider (PT): Ninfa Linden   Encounter Date: 02/20/2019  PT End of Session - 02/20/19 1428    Visit Number  17    Date for PT Re-Evaluation  02/24/19    Authorization Type  Medicare    PT Start Time  7782    PT Stop Time  1430    PT Time Calculation (min)  45 min    Activity Tolerance  Patient tolerated treatment well    Behavior During Therapy  [facility] for tasks assessed/performed       Past Medical History:  Diagnosis Date  . Anxiety   . Arthritis    "knees; right thumb" (10/14/2014)  . Basal cell carcinoma    "burned off upper lip & right shoulder"  . Chronic anticoagulation    CHADS VASC=3  . Complication of anesthesia    after spinal anesthesia for left knee replacement, bladder did "not wake up". Was incontinent for 4 days post surgery  . Depression   . Fibroid tumor   . Frequent diarrhea   . GERD (gastroesophageal reflux disease)   . High cholesterol   . Hypertension    EF 65-70% grade 2DD  . Insomnia   . Migraine    "frequently when I was younger; maybe monthly now" (10/14/2014) - none after hysterectomy  . PAF (paroxysmal atrial fibrillation) ([place]) 05/02/2018   converted with rate control  . Pernicious anemia    pt not aware of this  . Sleep apnea    uses a cpap  . Type II diabetes mellitus ([place])   . Vitamin B 12 deficiency     Past Surgical History:  Procedure Laterality Date  . ABDOMINAL HYSTERECTOMY  1988  . ACHILLES TENDON SURGERY Right   . APPENDECTOMY  1988  . BILATERAL OOPHORECTOMY  2004  . CARPAL TUNNEL RELEASE Bilateral 1986  . CATARACT EXTRACTION W/ INTRAOCULAR LENS  IMPLANT, BILATERAL Bilateral 2011  . COLONOSCOPY    . JOINT REPLACEMENT    . SHOULDER ARTHROSCOPY DISTAL  CLAVICLE EXCISION AND OPEN ROTATOR CUFF REPAIR Right 11/2013  . TOENAIL EXCISION Right 04/2018   "big toe"  . TONSILLECTOMY  1950's  . TOTAL KNEE ARTHROPLASTY Left 10/13/2014   Procedure: LEFT TOTAL KNEE ARTHROPLASTY;  Surgeon: Mcarthur Rossetti, MD;  Location: [place];  Service: Orthopedics;  Laterality: Left;  . TOTAL KNEE ARTHROPLASTY Right 12/10/2018   Procedure: RIGHT TOTAL KNEE ARTHROPLASTY;  Surgeon: Mcarthur Rossetti, MD;  Location: [place];  Service: Orthopedics;  Laterality: Right;    There were no vitals filed for this visit.  Subjective Assessment - 02/20/19 1358    Subjective  "Im ok"    Currently in Pain?  Yes    Pain Score  2     Pain Location  Knee    Pain Orientation  Right         [facility] PT Assessment - 02/20/19 0001      PROM   Right Shoulder Flexion  171 Degrees    Right Shoulder ABduction  155 Degrees    Right Shoulder Internal Rotation  71 Degrees    Right Shoulder External Rotation  72 Degrees                   [facility]  Adult PT Treatment/Exercise - 02/20/19 0001      Knee/Hip Exercises: Aerobic   Nustep  L4 x 5 min     Other Aerobic  UBE L3 x4 min       Knee/Hip Exercises: Machines for Strengthening   Cybex Knee Extension  5lb 2x15    Cybex Knee Flexion  RLE 20lb 2x10     Cybex Leg Press  40lb 2x15      Knee/Hip Exercises: Standing   Lateral Step Up  Right;1 set;10 reps;Hand Hold: 0;Step Height: 4"    Walking with Sports Cord  30lb 4 way x 3 each                PT Short Term Goals - 01/01/19 1142      PT SHORT TERM GOAL #1   Title  Independent with initial HEP     Status  Achieved        PT Long Term Goals - 02/20/19 1431      PT LONG TERM GOAL #1   Title  Independent with advanced HEP    Status  Achieved      PT LONG TERM GOAL #2   Title  increase right knee AROM to 5-115 degrees flexion    Status  On-going      PT LONG TERM GOAL #3   Title  walk with least restrictive device 500 feet and minimal  deviation    Status  Achieved      PT LONG TERM GOAL #4   Title  increase strength to 4+/5    Status  Partially Met      PT LONG TERM GOAL #5   Title  Able to walk community distances w/oknee pain    Status  Partially Met      PT LONG TERM GOAL #7   Title  increase ER of the right shoulder to 50 degrees    Status  Achieved            Plan - 02/20/19 1434    Clinical Impression Statement  PT Has progressed meeting her shoulder ROM goal. Attempted 6 inch lateral step up but RLE was too weak, She was however able to complete th intervention with 4 inch step. Increase weight tolerated with SL curls and extensions. R knee ROM remains limited.     Personal Factors and Comorbidities  Comorbidity 3+;Fitness    Comorbidities  DM, anxiety, depression, past achiiles rupture    Examination-Activity Limitations  Bed Mobility;Stairs;Squat;Locomotion Level;Stand;Toileting;Transfers    Examination-Participation Restrictions  Shop;Cleaning;Driving    Rehab Potential  Good    PT Frequency  1x / week    PT Duration  8 weeks    PT Treatment/Interventions  ADLs/Self Care Home Management;Cryotherapy;Electrical Stimulation;Functional mobility training;Stair training;Gait training;Therapeutic activities;Therapeutic exercise;Balance training;Neuromuscular re-education;Manual techniques;Patient/family education;Vasopneumatic Device    PT Next Visit Plan   treatment for shoulder and continue with the knee       Patient will benefit from skilled therapeutic intervention in order to improve the following deficits and impairments:     Visit Diagnosis: Localized edema  Stiffness of right shoulder, not elsewhere classified  Stiffness of right knee, not elsewhere classified  Acute pain of right shoulder     Problem List Patient Active Problem List   Diagnosis Date Noted  . Unilateral primary osteoarthritis, right knee 12/10/2018  . Status post total right knee replacement 12/10/2018  . Long  term (current) use of anticoagulants 08/16/2018  . Hypertensive cardiovascular disease 06/04/2018  .  Morbid obesity with BMI of 40.0-44.9, adult ([place]) 06/04/2018  . Fatigue 06/04/2018  . Chronic anticoagulation   . Atrial fibrillation and flutter ([place]) 05/02/2018  . Atrial fibrillation with RVR ([place]) 05/02/2018  . PAF (paroxysmal atrial fibrillation) ([place]) 05/02/2018  . Primary osteoarthritis of right knee 06/14/2017  . OSA (obstructive sleep apnea) 02/13/2017  . NIDDM-type 2 10/31/2016  . Essential hypertension 08/02/2016  . Anxiety and depression 08/02/2016  . Insomnia 08/02/2016  . Arthritis of left knee 10/13/2014  . Status post total left knee replacement 10/13/2014    Scot Jun, PTA 02/20/2019, 2:37 PM  [place] [address] [place], [place], [address] Phone: [phone]   Fax:  [phone]  Name: Avila Albritton MRN: [id number] Date of Birth: [birth year]

## 2019-02-20 NOTE — Telephone Encounter (Signed)
Please advise 

## 2019-02-21 ENCOUNTER — Other Ambulatory Visit: Payer: Self-pay | Admitting: Cardiology

## 2019-02-25 ENCOUNTER — Encounter (INDEPENDENT_AMBULATORY_CARE_PROVIDER_SITE_OTHER): Payer: Self-pay | Admitting: Family Medicine

## 2019-02-25 ENCOUNTER — Other Ambulatory Visit: Payer: Self-pay | Admitting: Family Medicine

## 2019-02-25 DIAGNOSIS — D649 Anemia, unspecified: Secondary | ICD-10-CM

## 2019-02-25 DIAGNOSIS — R634 Abnormal weight loss: Secondary | ICD-10-CM

## 2019-02-25 DIAGNOSIS — E119 Type 2 diabetes mellitus without complications: Secondary | ICD-10-CM

## 2019-02-25 DIAGNOSIS — R61 Generalized hyperhidrosis: Secondary | ICD-10-CM

## 2019-02-26 ENCOUNTER — Telehealth: Payer: Self-pay | Admitting: Pharmacist

## 2019-02-26 ENCOUNTER — Ambulatory Visit (INDEPENDENT_AMBULATORY_CARE_PROVIDER_SITE_OTHER): Payer: Medicare Other | Admitting: Orthopaedic Surgery

## 2019-02-26 ENCOUNTER — Encounter: Payer: Self-pay | Admitting: Orthopaedic Surgery

## 2019-02-26 ENCOUNTER — Other Ambulatory Visit: Payer: Self-pay

## 2019-02-26 DIAGNOSIS — Z96651 Presence of right artificial knee joint: Secondary | ICD-10-CM

## 2019-02-26 NOTE — Progress Notes (Signed)
The patient is now 41 days status post a right total knee arthroplasty.  She is doing well overall and is very active.  She says range of motion and strength are increasing.  She has been into physical therapy as an outpatient leave work on her shoulder as well.  She says she is doing well and feels that after this next physical therapy visit she can potentially just transition to a home exercise program.  On examination of her right knee there is only minimal swelling at this point.  Extension is almost full and her flexion is past 90 degrees.  Her right knee feels ligamentously stable.  At this point she will increase her activities as comfort allows.  She can transition to a home exercise program.  We can see her back in 3 months.  At that visit I would like an AP and lateral of her right knee.

## 2019-02-26 NOTE — Telephone Encounter (Signed)
LMOM - need to move Coumadin appt from [facility] to [facility] schedule for next week. 

## 2019-02-26 NOTE — Telephone Encounter (Signed)
appt changed

## 2019-02-27 ENCOUNTER — Ambulatory Visit: Payer: Medicare Other | Admitting: Physical Therapy

## 2019-02-27 ENCOUNTER — Encounter: Payer: Self-pay | Admitting: Family Medicine

## 2019-02-27 ENCOUNTER — Ambulatory Visit: Payer: Medicare Other | Admitting: Family Medicine

## 2019-03-01 ENCOUNTER — Other Ambulatory Visit: Payer: Self-pay | Admitting: Cardiology

## 2019-03-01 ENCOUNTER — Encounter: Payer: Self-pay | Admitting: Family Medicine

## 2019-03-01 ENCOUNTER — Telehealth: Payer: Self-pay

## 2019-03-01 NOTE — Telephone Encounter (Signed)
vm left on identified vm stating that patient needs to call back d/t additional testing needed following previous office visit. PEC number and times open provided. Debra Wright covid s/sx following potential covid exposure.

## 2019-03-03 ENCOUNTER — Encounter: Payer: Self-pay | Admitting: Family Medicine

## 2019-03-03 ENCOUNTER — Telehealth: Payer: Self-pay

## 2019-03-03 NOTE — Telephone Encounter (Signed)

## 2019-03-04 ENCOUNTER — Ambulatory Visit (INDEPENDENT_AMBULATORY_CARE_PROVIDER_SITE_OTHER): Payer: Medicare Other

## 2019-03-04 ENCOUNTER — Encounter: Payer: Medicare Other | Admitting: Physical Therapy

## 2019-03-04 ENCOUNTER — Other Ambulatory Visit: Payer: Self-pay

## 2019-03-04 DIAGNOSIS — D649 Anemia, unspecified: Secondary | ICD-10-CM | POA: Diagnosis not present

## 2019-03-04 DIAGNOSIS — R634 Abnormal weight loss: Secondary | ICD-10-CM | POA: Diagnosis not present

## 2019-03-04 DIAGNOSIS — E119 Type 2 diabetes mellitus without complications: Secondary | ICD-10-CM

## 2019-03-04 DIAGNOSIS — R61 Generalized hyperhidrosis: Secondary | ICD-10-CM | POA: Diagnosis not present

## 2019-03-04 NOTE — Progress Notes (Signed)
Labs drawn per Dr. Junius Roads: ESR, Thyroid panel + TSH, Iron/TIBC/ferritin panel, CRP, CMET & CBC/diff.

## 2019-03-05 ENCOUNTER — Ambulatory Visit (INDEPENDENT_AMBULATORY_CARE_PROVIDER_SITE_OTHER): Payer: Medicare Other | Admitting: Pharmacist

## 2019-03-05 ENCOUNTER — Encounter: Payer: Self-pay | Admitting: Family Medicine

## 2019-03-05 ENCOUNTER — Telehealth: Payer: Self-pay | Admitting: Family Medicine

## 2019-03-05 ENCOUNTER — Other Ambulatory Visit: Payer: Self-pay

## 2019-03-05 DIAGNOSIS — I4891 Unspecified atrial fibrillation: Secondary | ICD-10-CM

## 2019-03-05 DIAGNOSIS — Z7901 Long term (current) use of anticoagulants: Secondary | ICD-10-CM

## 2019-03-05 LAB — THYROID PANEL WITH TSH
Free Thyroxine Index: 2.4 (ref 1.4–3.8)
T3 Uptake: 33 % (ref 22–35)
T4, Total: 7.3 ug/dL (ref 5.1–11.9)
TSH: 2.14 mIU/L (ref 0.40–4.50)

## 2019-03-05 LAB — CBC WITH DIFFERENTIAL/PLATELET
Absolute Monocytes: 559 cells/uL (ref 200–950)
Basophils Absolute: 21 cells/uL (ref 0–200)
Basophils Relative: 0.3 %
Eosinophils Absolute: 186 cells/uL (ref 15–500)
Eosinophils Relative: 2.7 %
HCT: 33.7 % — ABNORMAL LOW (ref 35.0–45.0)
Hemoglobin: 10.2 g/dL — ABNORMAL LOW (ref 11.7–15.5)
Lymphs Abs: 2781 cells/uL (ref 850–3900)
MCH: 23.8 pg — ABNORMAL LOW (ref 27.0–33.0)
MCHC: 30.3 g/dL — ABNORMAL LOW (ref 32.0–36.0)
MCV: 78.7 fL — ABNORMAL LOW (ref 80.0–100.0)
MPV: 10.6 fL (ref 7.5–12.5)
Monocytes Relative: 8.1 %
Neutro Abs: 3353 cells/uL (ref 1500–7800)
Neutrophils Relative %: 48.6 %
Platelets: 222 10*3/uL (ref 140–400)
RBC: 4.28 10*6/uL (ref 3.80–5.10)
RDW: 16.6 % — ABNORMAL HIGH (ref 11.0–15.0)
Total Lymphocyte: 40.3 %
WBC: 6.9 10*3/uL (ref 3.8–10.8)

## 2019-03-05 LAB — COMPREHENSIVE METABOLIC PANEL
AG Ratio: 1.9 (calc) (ref 1.0–2.5)
ALT: 13 U/L (ref 6–29)
AST: 12 U/L (ref 10–35)
Albumin: 3.7 g/dL (ref 3.6–5.1)
Alkaline phosphatase (APISO): 68 U/L (ref 37–153)
BUN: 16 mg/dL (ref 7–25)
CO2: 29 mmol/L (ref 20–32)
Calcium: 8.9 mg/dL (ref 8.6–10.4)
Chloride: 101 mmol/L (ref 98–110)
Creat: 0.64 mg/dL (ref 0.60–0.93)
Globulin: 1.9 g/dL (calc) (ref 1.9–3.7)
Glucose, Bld: 69 mg/dL (ref 65–99)
Potassium: 4.2 mmol/L (ref 3.5–5.3)
Sodium: 138 mmol/L (ref 135–146)
Total Bilirubin: 0.4 mg/dL (ref 0.2–1.2)
Total Protein: 5.6 g/dL — ABNORMAL LOW (ref 6.1–8.1)

## 2019-03-05 LAB — POCT INR: INR: 2 (ref 2.0–3.0)

## 2019-03-05 LAB — SEDIMENTATION RATE: Sed Rate: 2 mm/h (ref 0–30)

## 2019-03-05 LAB — C-REACTIVE PROTEIN: CRP: 0.6 mg/L (ref <8.0)

## 2019-03-05 LAB — IRON,TIBC AND FERRITIN PANEL
%SAT: 6 % (calc) — ABNORMAL LOW (ref 16–45)
Ferritin: 10 ng/mL — ABNORMAL LOW (ref 16–288)
Iron: 21 ug/dL — ABNORMAL LOW (ref 45–160)
TIBC: 372 mcg/dL (calc) (ref 250–450)

## 2019-03-05 NOTE — Telephone Encounter (Signed)
Labs show substantial iron deficiency with ferritin of 10, iron of 21, and hemoglobin of 10.2.  This may be the source of symptoms.  It usually takes 2-3 months to replenish stores of iron.  Would recommend taking ferrous sulfate or gluconate, roughly 325 mg twice daily with food and then we'll recheck labs in about 3 months.

## 2019-03-06 ENCOUNTER — Encounter: Payer: Self-pay | Admitting: Family Medicine

## 2019-03-06 ENCOUNTER — Encounter: Payer: Medicare Other | Admitting: Physical Therapy

## 2019-03-08 ENCOUNTER — Other Ambulatory Visit (INDEPENDENT_AMBULATORY_CARE_PROVIDER_SITE_OTHER): Payer: Self-pay | Admitting: Family Medicine

## 2019-03-14 ENCOUNTER — Telehealth: Payer: Self-pay | Admitting: *Deleted

## 2019-03-14 NOTE — Care Plan (Signed)
RNCM contacted patient for 90 day Ortho Bundle call and [employer] Survey. Patient reports she is doing well and is able to perform all activities of daily living independently. She verbalized she is extremely pleased with her status post-surgery. Will continue working on straightening the knee and doing her home exercise program.

## 2019-03-14 NOTE — Telephone Encounter (Signed)
Ortho Bundle 90 day call and survey completed.

## 2019-03-19 ENCOUNTER — Other Ambulatory Visit: Payer: Self-pay | Admitting: Family Medicine

## 2019-03-19 MED ORDER — DULOXETINE HCL 30 MG PO CPEP: 30.0000 mg | ORAL_CAPSULE | Freq: Every day | ORAL | 3 refills | Status: DC

## 2019-03-26 ENCOUNTER — Telehealth: Payer: Self-pay

## 2019-03-26 ENCOUNTER — Other Ambulatory Visit: Payer: Self-pay | Admitting: Cardiovascular Disease

## 2019-03-26 NOTE — Telephone Encounter (Signed)
lmom for prescreen  

## 2019-04-05 ENCOUNTER — Other Ambulatory Visit: Payer: Self-pay | Admitting: Adult Health

## 2019-04-05 DIAGNOSIS — Z760 Encounter for issue of repeat prescription: Secondary | ICD-10-CM

## 2019-04-07 ENCOUNTER — Telehealth: Payer: Self-pay

## 2019-04-07 NOTE — Telephone Encounter (Signed)

## 2019-04-08 ENCOUNTER — Ambulatory Visit (INDEPENDENT_AMBULATORY_CARE_PROVIDER_SITE_OTHER): Payer: Medicare Other | Admitting: Pharmacist

## 2019-04-08 ENCOUNTER — Other Ambulatory Visit: Payer: Self-pay

## 2019-04-08 DIAGNOSIS — I4891 Unspecified atrial fibrillation: Secondary | ICD-10-CM

## 2019-04-08 DIAGNOSIS — Z7901 Long term (current) use of anticoagulants: Secondary | ICD-10-CM

## 2019-04-08 LAB — POCT INR: INR: 2 (ref 2.0–3.0)

## 2019-04-09 ENCOUNTER — Encounter (INDEPENDENT_AMBULATORY_CARE_PROVIDER_SITE_OTHER): Payer: Self-pay | Admitting: Family Medicine

## 2019-04-09 DIAGNOSIS — Z760 Encounter for issue of repeat prescription: Secondary | ICD-10-CM

## 2019-04-09 MED ORDER — OMEPRAZOLE 40 MG PO CPDR: 40.0000 mg | DELAYED_RELEASE_CAPSULE | ORAL | 3 refills | Status: DC

## 2019-04-09 NOTE — Telephone Encounter (Signed)
Denied.  Pt sees Dr. Eunice Blase for primary care.

## 2019-04-14 MED ORDER — METOPROLOL TARTRATE 25 MG PO TABS: ORAL_TABLET | ORAL | 4 refills | Status: DC

## 2019-04-24 ENCOUNTER — Other Ambulatory Visit (INDEPENDENT_AMBULATORY_CARE_PROVIDER_SITE_OTHER): Payer: Self-pay | Admitting: Family Medicine

## 2019-04-25 NOTE — Telephone Encounter (Signed)
Hilts

## 2019-04-25 NOTE — Telephone Encounter (Signed)
Please advise on this.  

## 2019-04-27 MED ORDER — CLONAZEPAM 0.5 MG PO TABS: ORAL_TABLET | ORAL | 0 refills | Status: DC

## 2019-05-06 DIAGNOSIS — M5416 Radiculopathy, lumbar region: Secondary | ICD-10-CM | POA: Diagnosis not present

## 2019-05-06 DIAGNOSIS — M48062 Spinal stenosis, lumbar region with neurogenic claudication: Secondary | ICD-10-CM | POA: Diagnosis not present

## 2019-05-06 DIAGNOSIS — M5136 Other intervertebral disc degeneration, lumbar region: Secondary | ICD-10-CM | POA: Diagnosis not present

## 2019-05-06 DIAGNOSIS — M4300 Spondylolysis, site unspecified: Secondary | ICD-10-CM | POA: Diagnosis not present

## 2019-05-06 DIAGNOSIS — M4310 Spondylolisthesis, site unspecified: Secondary | ICD-10-CM | POA: Diagnosis not present

## 2019-05-06 DIAGNOSIS — M47816 Spondylosis without myelopathy or radiculopathy, lumbar region: Secondary | ICD-10-CM | POA: Diagnosis not present

## 2019-05-12 DIAGNOSIS — M4726 Other spondylosis with radiculopathy, lumbar region: Secondary | ICD-10-CM | POA: Diagnosis not present

## 2019-05-12 DIAGNOSIS — M48061 Spinal stenosis, lumbar region without neurogenic claudication: Secondary | ICD-10-CM | POA: Diagnosis not present

## 2019-05-12 DIAGNOSIS — M5136 Other intervertebral disc degeneration, lumbar region: Secondary | ICD-10-CM | POA: Diagnosis not present

## 2019-05-16 ENCOUNTER — Encounter (INDEPENDENT_AMBULATORY_CARE_PROVIDER_SITE_OTHER): Payer: Self-pay | Admitting: Family Medicine

## 2019-05-16 MED ORDER — CLONAZEPAM 0.5 MG PO TABS: ORAL_TABLET | ORAL | 0 refills | Status: DC

## 2019-05-17 ENCOUNTER — Other Ambulatory Visit (INDEPENDENT_AMBULATORY_CARE_PROVIDER_SITE_OTHER): Payer: Self-pay | Admitting: Family Medicine

## 2019-05-21 ENCOUNTER — Ambulatory Visit (INDEPENDENT_AMBULATORY_CARE_PROVIDER_SITE_OTHER): Payer: Medicare Other | Admitting: Pharmacist

## 2019-05-21 ENCOUNTER — Encounter (INDEPENDENT_AMBULATORY_CARE_PROVIDER_SITE_OTHER): Payer: Self-pay | Admitting: Family Medicine

## 2019-05-21 ENCOUNTER — Other Ambulatory Visit: Payer: Self-pay

## 2019-05-21 DIAGNOSIS — I4891 Unspecified atrial fibrillation: Secondary | ICD-10-CM | POA: Diagnosis not present

## 2019-05-21 DIAGNOSIS — Z7901 Long term (current) use of anticoagulants: Secondary | ICD-10-CM | POA: Diagnosis not present

## 2019-05-21 LAB — POCT INR: INR: 3 (ref 2.0–3.0)

## 2019-05-30 ENCOUNTER — Ambulatory Visit: Payer: Medicare Other | Admitting: Family Medicine

## 2019-05-31 ENCOUNTER — Other Ambulatory Visit: Payer: Self-pay | Admitting: Adult Health

## 2019-06-02 ENCOUNTER — Telehealth: Payer: Self-pay | Admitting: *Deleted

## 2019-06-02 ENCOUNTER — Encounter: Payer: Self-pay | Admitting: Orthopaedic Surgery

## 2019-06-02 ENCOUNTER — Ambulatory Visit (INDEPENDENT_AMBULATORY_CARE_PROVIDER_SITE_OTHER): Payer: Medicare Other | Admitting: Orthopaedic Surgery

## 2019-06-02 ENCOUNTER — Ambulatory Visit (INDEPENDENT_AMBULATORY_CARE_PROVIDER_SITE_OTHER): Payer: Medicare Other

## 2019-06-02 DIAGNOSIS — Z96651 Presence of right artificial knee joint: Secondary | ICD-10-CM

## 2019-06-02 NOTE — Progress Notes (Signed)
The patient is now 6 months status post a right total knee arthroplasty.  She is several years out from a left knee replacement.  She says she is doing well and that her motion and strength are increasing each week.  She denies any significant pain.  On examination of her right knee her extension is full and her flexion is full.  Her knee is ligamentously stable.  There is only mild swelling.  2 views of the knee are obtained and you can see bilateral total knee arthroplasties on the AP view.  There is no complicating features of her right knee on the AP or lateral views.  She will continue creaser activities as comfort allows.  Since this is a press-fit knee I would like to see her back at 6 months which will be the one-year standpoint for surgery.  At that visit I would like an AP and lateral of her right knee but I like this supine with making sure that they get a true AP and lateral of the right knee.

## 2019-06-02 NOTE — Care Plan (Signed)
Saw patient while in office today for f/u with MD for check of Right knee arthroplasty. She is 6 months out from surgery. Reports she is doing very well and getting more strength and movement still. MD would like to see her back in 6 months at 1 year post surgery. Patient is aware of how to contact office or RNCM for any further questions or needs.

## 2019-06-02 NOTE — Telephone Encounter (Signed)
6 mont Ortho bundle in office visit completed.

## 2019-06-03 NOTE — Telephone Encounter (Signed)
Denied.  Pt now sees dr. Legrand Como hilts

## 2019-06-10 ENCOUNTER — Other Ambulatory Visit: Payer: Self-pay | Admitting: Cardiovascular Disease

## 2019-06-10 NOTE — Telephone Encounter (Signed)
Refill request

## 2019-06-21 ENCOUNTER — Other Ambulatory Visit: Payer: Self-pay | Admitting: Cardiology

## 2019-06-21 ENCOUNTER — Other Ambulatory Visit (INDEPENDENT_AMBULATORY_CARE_PROVIDER_SITE_OTHER): Payer: Self-pay | Admitting: Family Medicine

## 2019-06-21 ENCOUNTER — Encounter (INDEPENDENT_AMBULATORY_CARE_PROVIDER_SITE_OTHER): Payer: Self-pay | Admitting: Family Medicine

## 2019-06-22 ENCOUNTER — Encounter (INDEPENDENT_AMBULATORY_CARE_PROVIDER_SITE_OTHER): Payer: Self-pay | Admitting: Family Medicine

## 2019-06-23 ENCOUNTER — Encounter: Payer: Self-pay | Admitting: Family Medicine

## 2019-06-23 ENCOUNTER — Encounter (INDEPENDENT_AMBULATORY_CARE_PROVIDER_SITE_OTHER): Payer: Self-pay | Admitting: Family Medicine

## 2019-06-23 DIAGNOSIS — D649 Anemia, unspecified: Secondary | ICD-10-CM

## 2019-06-23 MED ORDER — ESZOPICLONE 3 MG PO TABS: 3.0000 mg | ORAL_TABLET | Freq: Every day | ORAL | 0 refills | Status: DC

## 2019-06-23 NOTE — Telephone Encounter (Signed)
Hilts patient.  

## 2019-06-23 NOTE — Telephone Encounter (Signed)
Please advise 

## 2019-06-24 ENCOUNTER — Ambulatory Visit: Payer: Medicare Other | Admitting: Cardiovascular Disease

## 2019-06-26 ENCOUNTER — Other Ambulatory Visit: Payer: Self-pay

## 2019-06-26 ENCOUNTER — Ambulatory Visit (INDEPENDENT_AMBULATORY_CARE_PROVIDER_SITE_OTHER): Payer: Medicare Other | Admitting: Pharmacist

## 2019-06-26 DIAGNOSIS — Z7901 Long term (current) use of anticoagulants: Secondary | ICD-10-CM

## 2019-06-26 DIAGNOSIS — I4891 Unspecified atrial fibrillation: Secondary | ICD-10-CM | POA: Diagnosis not present

## 2019-06-26 LAB — POCT INR: INR: 1.6 — AB (ref 2.0–3.0)

## 2019-07-04 ENCOUNTER — Ambulatory Visit (INDEPENDENT_AMBULATORY_CARE_PROVIDER_SITE_OTHER): Payer: Medicare Other

## 2019-07-04 ENCOUNTER — Ambulatory Visit: Payer: Medicare Other | Admitting: Family Medicine

## 2019-07-04 ENCOUNTER — Other Ambulatory Visit: Payer: Self-pay

## 2019-07-04 DIAGNOSIS — E119 Type 2 diabetes mellitus without complications: Secondary | ICD-10-CM

## 2019-07-04 DIAGNOSIS — I10 Essential (primary) hypertension: Secondary | ICD-10-CM

## 2019-07-04 DIAGNOSIS — D649 Anemia, unspecified: Secondary | ICD-10-CM

## 2019-07-04 NOTE — Progress Notes (Signed)
Fasting labwork drawn per Dr. Junius Roads: Iron, TIBC and ferritin & CBC/diff.

## 2019-07-05 ENCOUNTER — Telehealth: Payer: Self-pay | Admitting: Family Medicine

## 2019-07-05 LAB — CBC WITH DIFFERENTIAL/PLATELET
Absolute Monocytes: 739 cells/uL (ref 200–950)
Basophils Absolute: 33 cells/uL (ref 0–200)
Basophils Relative: 0.4 %
Eosinophils Absolute: 141 cells/uL (ref 15–500)
Eosinophils Relative: 1.7 %
HCT: 39.1 % (ref 35.0–45.0)
Hemoglobin: 12.4 g/dL (ref 11.7–15.5)
Lymphs Abs: 3162 cells/uL (ref 850–3900)
MCH: 27.1 pg (ref 27.0–33.0)
MCHC: 31.7 g/dL — ABNORMAL LOW (ref 32.0–36.0)
MCV: 85.6 fL (ref 80.0–100.0)
MPV: 10.6 fL (ref 7.5–12.5)
Monocytes Relative: 8.9 %
Neutro Abs: 4225 cells/uL (ref 1500–7800)
Neutrophils Relative %: 50.9 %
Platelets: 283 10*3/uL (ref 140–400)
RBC: 4.57 10*6/uL (ref 3.80–5.10)
RDW: 15.6 % — ABNORMAL HIGH (ref 11.0–15.0)
Total Lymphocyte: 38.1 %
WBC: 8.3 10*3/uL (ref 3.8–10.8)

## 2019-07-05 LAB — IRON,TIBC AND FERRITIN PANEL
%SAT: 20 % (calc) (ref 16–45)
Ferritin: 17 ng/mL (ref 16–288)
Iron: 68 ug/dL (ref 45–160)
TIBC: 334 mcg/dL (calc) (ref 250–450)

## 2019-07-05 NOTE — Telephone Encounter (Signed)
Labs look great.

## 2019-07-08 DIAGNOSIS — Z23 Encounter for immunization: Secondary | ICD-10-CM | POA: Diagnosis not present

## 2019-07-10 ENCOUNTER — Other Ambulatory Visit: Payer: Self-pay

## 2019-07-10 ENCOUNTER — Ambulatory Visit (INDEPENDENT_AMBULATORY_CARE_PROVIDER_SITE_OTHER): Payer: Medicare Other | Admitting: Pharmacist

## 2019-07-10 DIAGNOSIS — Z7901 Long term (current) use of anticoagulants: Secondary | ICD-10-CM | POA: Diagnosis not present

## 2019-07-10 DIAGNOSIS — I4891 Unspecified atrial fibrillation: Secondary | ICD-10-CM | POA: Diagnosis not present

## 2019-07-10 LAB — POCT INR: INR: 3.1 — AB (ref 2.0–3.0)

## 2019-07-18 ENCOUNTER — Encounter (INDEPENDENT_AMBULATORY_CARE_PROVIDER_SITE_OTHER): Payer: Self-pay | Admitting: Family Medicine

## 2019-07-18 ENCOUNTER — Other Ambulatory Visit (INDEPENDENT_AMBULATORY_CARE_PROVIDER_SITE_OTHER): Payer: Self-pay | Admitting: Family Medicine

## 2019-07-18 MED ORDER — ESZOPICLONE 3 MG PO TABS: 3.0000 mg | ORAL_TABLET | Freq: Every day | ORAL | 0 refills | Status: DC

## 2019-07-18 MED ORDER — CLONAZEPAM 0.5 MG PO TABS: ORAL_TABLET | ORAL | 0 refills | Status: DC

## 2019-07-18 NOTE — Telephone Encounter (Signed)
Advised patient Rx has been sent to pharmacy

## 2019-07-18 NOTE — Telephone Encounter (Signed)
Please advise 

## 2019-08-06 ENCOUNTER — Ambulatory Visit (INDEPENDENT_AMBULATORY_CARE_PROVIDER_SITE_OTHER): Payer: Medicare Other | Admitting: Pharmacist

## 2019-08-06 ENCOUNTER — Other Ambulatory Visit: Payer: Self-pay

## 2019-08-06 DIAGNOSIS — Z7901 Long term (current) use of anticoagulants: Secondary | ICD-10-CM | POA: Diagnosis not present

## 2019-08-06 DIAGNOSIS — I4891 Unspecified atrial fibrillation: Secondary | ICD-10-CM | POA: Diagnosis not present

## 2019-08-06 LAB — POCT INR: INR: 3.3 — AB (ref 2.0–3.0)

## 2019-08-13 ENCOUNTER — Encounter (INDEPENDENT_AMBULATORY_CARE_PROVIDER_SITE_OTHER): Payer: Self-pay | Admitting: Family Medicine

## 2019-08-13 ENCOUNTER — Other Ambulatory Visit (INDEPENDENT_AMBULATORY_CARE_PROVIDER_SITE_OTHER): Payer: Self-pay | Admitting: Family Medicine

## 2019-08-13 MED ORDER — ESZOPICLONE 3 MG PO TABS: 3.0000 mg | ORAL_TABLET | Freq: Every day | ORAL | 0 refills | Status: DC

## 2019-08-13 MED ORDER — METFORMIN HCL 1000 MG PO TABS: 1000.0000 mg | ORAL_TABLET | Freq: Two times a day (BID) | ORAL | 3 refills | Status: DC

## 2019-08-13 NOTE — Telephone Encounter (Signed)
Please advise 

## 2019-08-14 ENCOUNTER — Encounter (INDEPENDENT_AMBULATORY_CARE_PROVIDER_SITE_OTHER): Payer: Self-pay | Admitting: Family Medicine

## 2019-08-15 ENCOUNTER — Encounter (INDEPENDENT_AMBULATORY_CARE_PROVIDER_SITE_OTHER): Payer: Self-pay | Admitting: Family Medicine

## 2019-08-27 ENCOUNTER — Telehealth: Payer: Self-pay | Admitting: Pharmacist

## 2019-08-27 NOTE — Telephone Encounter (Signed)
Patient called to report scheduled spinal injection on Dec/3 and need to come out of warfarin (coumadin) for 5 days.    Patient with diagnosis of AFIB on warfarin for anticoagulation.    Procedure: spinal injection Date of procedure: Dec/3  CHADS2-VASc score of  4 ( HTN, AGE, DM2, female)  Per office protocol, patient can hold WARFARIN for 5 days prior to procedure.   Patient will not need bridging with Lovenox (enoxaparin) around procedure.  *INR APPT CHANGED FROM DEC/8 TO DEC/16*

## 2019-08-31 ENCOUNTER — Encounter (INDEPENDENT_AMBULATORY_CARE_PROVIDER_SITE_OTHER): Payer: Self-pay | Admitting: Family Medicine

## 2019-09-01 MED ORDER — CLONAZEPAM 0.5 MG PO TABS: ORAL_TABLET | ORAL | 0 refills | Status: DC

## 2019-09-04 DIAGNOSIS — M48061 Spinal stenosis, lumbar region without neurogenic claudication: Secondary | ICD-10-CM | POA: Diagnosis not present

## 2019-09-04 DIAGNOSIS — M4726 Other spondylosis with radiculopathy, lumbar region: Secondary | ICD-10-CM | POA: Diagnosis not present

## 2019-09-04 DIAGNOSIS — M5136 Other intervertebral disc degeneration, lumbar region: Secondary | ICD-10-CM | POA: Diagnosis not present

## 2019-09-08 ENCOUNTER — Other Ambulatory Visit: Payer: Self-pay | Admitting: Cardiovascular Disease

## 2019-09-09 ENCOUNTER — Encounter: Payer: Self-pay | Admitting: Cardiovascular Disease

## 2019-09-09 ENCOUNTER — Other Ambulatory Visit: Payer: Self-pay

## 2019-09-09 ENCOUNTER — Ambulatory Visit (INDEPENDENT_AMBULATORY_CARE_PROVIDER_SITE_OTHER): Payer: Medicare Other | Admitting: Cardiovascular Disease

## 2019-09-09 VITALS — BP 154/76 | HR 54 | Temp 96.8°F | Ht 63.0 in | Wt 226.8 lb

## 2019-09-09 DIAGNOSIS — Z7901 Long term (current) use of anticoagulants: Secondary | ICD-10-CM | POA: Diagnosis not present

## 2019-09-09 DIAGNOSIS — I119 Hypertensive heart disease without heart failure: Secondary | ICD-10-CM

## 2019-09-09 DIAGNOSIS — I10 Essential (primary) hypertension: Secondary | ICD-10-CM

## 2019-09-09 DIAGNOSIS — Z6841 Body Mass Index (BMI) 40.0 and over, adult: Secondary | ICD-10-CM | POA: Diagnosis not present

## 2019-09-09 DIAGNOSIS — G4733 Obstructive sleep apnea (adult) (pediatric): Secondary | ICD-10-CM

## 2019-09-09 DIAGNOSIS — Z79899 Other long term (current) drug therapy: Secondary | ICD-10-CM | POA: Diagnosis not present

## 2019-09-09 DIAGNOSIS — I480 Paroxysmal atrial fibrillation: Secondary | ICD-10-CM | POA: Diagnosis not present

## 2019-09-09 DIAGNOSIS — Z96651 Presence of right artificial knee joint: Secondary | ICD-10-CM | POA: Diagnosis not present

## 2019-09-09 MED ORDER — SPIRONOLACTONE 25 MG PO TABS: 12.5000 mg | ORAL_TABLET | Freq: Every day | ORAL | 3 refills | Status: DC

## 2019-09-09 NOTE — Progress Notes (Signed)
Cardiology Office Note    Date:  09/16/2019   ID:  Debra, Wright 1950-11-25, MRN [id number]  PCP:  Eunice Blase, MD  Cardiologist:  Shelva Majestic, MD   Initial cardiology office visit with me  History of Present Illness:  Debra Wright is a 68 y.o. female who was seen by me in the hospital in August 2019 when she presented with new onset atrial fibrillation and reverted to sinus rhythm in the emergency room after multiple rounds of IV metoprolol.  She has a history of hypertension.  She had mild chest tightness associated with a rapid rate, troponins were negative and she had nonspecific ST-T changes.  I have not seen her since that initial hospital assessment.  The patient was evaluated by Kerin Ransom, PA-C on January 27, 2019 in a telemedicine conference.  She has a history of hypertension, type 2 diabetes mellitus, hyperlipidemia, anxiety, and has sleep apnea which is followed by Dr. Berneice Gandy.  During her 2019 hospitalization, she converted to sinus rhythm with IV metoprolol and oral metoprolol was added to her regimen.  Amlodipine was changed to diltiazem.  An echo Doppler in the hospital demonstrated normal LV function with moderate LVH, grade 2 diastolic dysfunction, aortic valve sclerosis without stenosis, and mild to moderate left atrial enlargement.  Subsequently, her metoprolol dose had been reduced due to fatigability.  She had undergone total knee replacement in March 2020 completed physical therapy.  Presently, she denies any awareness of recurrent AF.  Since her knee surgery she had a 20 pound weight loss but subsequently admits that she has regained approximately 12 pounds back.  She has had recent blood pressure lability.  She continues to use CPAP.  She presents for evaluation.   Past Medical History:  Diagnosis Date  . Anxiety   . Arthritis    "knees; right thumb" (10/14/2014)  . Basal cell carcinoma    "burned off upper lip & right shoulder"  . Chronic anticoagulation     CHADS VASC=3  . Complication of anesthesia    after spinal anesthesia for left knee replacement, bladder did "not wake up". Was incontinent for 4 days post surgery  . Depression   . Fibroid tumor   . Frequent diarrhea   . GERD (gastroesophageal reflux disease)   . High cholesterol   . Hypertension    EF 65-70% grade 2DD  . Insomnia   . Migraine    "frequently when I was younger; maybe monthly now" (10/14/2014) - none after hysterectomy  . PAF (paroxysmal atrial fibrillation) ([facility]) 05/02/2018   converted with rate control  . Pernicious anemia    pt not aware of this  . Sleep apnea    uses a cpap  . Type II diabetes mellitus ([place])   . Vitamin B 12 deficiency     Past Surgical History:  Procedure Laterality Date  . ABDOMINAL HYSTERECTOMY  1988  . ACHILLES TENDON SURGERY Right   . APPENDECTOMY  1988  . BILATERAL OOPHORECTOMY  2004  . CARPAL TUNNEL RELEASE Bilateral 1986  . CATARACT EXTRACTION W/ INTRAOCULAR LENS  IMPLANT, BILATERAL Bilateral 2011  . COLONOSCOPY    . JOINT REPLACEMENT    . SHOULDER ARTHROSCOPY DISTAL CLAVICLE EXCISION AND OPEN ROTATOR CUFF REPAIR Right 11/2013  . TOENAIL EXCISION Right 04/2018   "big toe"  . TONSILLECTOMY  1950's  . TOTAL KNEE ARTHROPLASTY Left 10/13/2014   Procedure: LEFT TOTAL KNEE ARTHROPLASTY;  Surgeon: Mcarthur Rossetti, MD;  Location: [place];  Service: Orthopedics;  Laterality: Left;  . TOTAL KNEE ARTHROPLASTY Right 12/10/2018   Procedure: RIGHT TOTAL KNEE ARTHROPLASTY;  Surgeon: Mcarthur Rossetti, MD;  Location: [place];  Service: Orthopedics;  Laterality: Right;    Current Medications: Outpatient Medications Prior to Visit  Medication Sig Dispense Refill  . calcium carbonate (TUMS - DOSED IN MG ELEMENTAL CALCIUM) 500 MG chewable tablet Chew 2 tablets by mouth daily as needed for indigestion or heartburn.    . Calcium Carbonate-Vitamin D (CALCIUM 600 + D PO) Take 1 tablet by mouth daily.    Marland Kitchen CARTIA XT 180 MG 24 hr capsule  Take 1 capsule by mouth once daily 30 capsule 10  . CINNAMON PO Take 1 tablet by mouth daily.    . citalopram (CELEXA) 40 MG tablet Take 1 tablet (40 mg total) by mouth daily. 90 tablet 2  . clonazePAM (KLONOPIN) 0.5 MG tablet TAKE 1/2 TAB BY MOUTH AT BEDTIME AS NEEDED 20 tablet 0  . magnesium oxide (MAG-OX) 400 (241.3 Mg) MG tablet Take 400 mg by mouth daily.    . metFORMIN (GLUCOPHAGE) 1000 MG tablet Take 1 tablet (1,000 mg total) by mouth 2 (two) times daily with a meal. 180 tablet 3  . metoprolol tartrate (LOPRESSOR) 25 MG tablet TAKE HALF TABLET BY MOUTH TWICE DAILY 60 tablet 1  . Omega-3 Fatty Acids (OMEGA-3 FISH OIL) 1200 MG CAPS Take 2,400 mg by mouth 2 (two) times daily.    Marland Kitchen omeprazole (PRILOSEC) 40 MG capsule Take 1 capsule (40 mg total) by mouth every morning. 90 capsule 3  . warfarin (COUMADIN) 5 MG tablet TAKE 1 TO 1 & 1/2 TABLETS BY MOUTH ONCE DAILY AS DIRECTED BY COUMADIN [facility] 90 tablet 0  . atorvastatin (LIPITOR) 40 MG tablet TAKE 1 TABLET BY MOUTH ONCE DAILY (Patient taking differently: Take 40 mg by mouth at bedtime. ) 90 tablet 3  . Eszopiclone 3 MG TABS TAKE ONE TABLET BY MOUTH IMMEDIATELY BEFORE BEDTIME AS NEEDED  30 tablet 0  . glipiZIDE (GLIPIZIDE XL) 5 MG 24 hr tablet TAKE 1 TABLET BY MOUTH ONCE DAILY WITH BREAKFAST (Patient taking differently: Take 5 mg by mouth daily with breakfast. TAKE 1 TABLET BY MOUTH ONCE DAILY WITH BREAKFAST) 90 tablet 3  . DULoxetine (CYMBALTA) 30 MG capsule Take 1 capsule (30 mg total) by mouth daily. 90 capsule 3  . Eszopiclone 3 MG TABS take 1 tablet by mouth immediately before bedtime 30 tablet 0  . Eszopiclone 3 MG TABS Take 1 tablet (3 mg total) by mouth at bedtime. Take immediately before bedtime 30 tablet 0  . Eszopiclone 3 MG TABS Take 1 tablet (3 mg total) by mouth at bedtime. Take immediately before bedtime 30 tablet 0   No facility-administered medications prior to visit.     Allergies:   Antivert [meclizine hcl]   Social History    Socioeconomic History  . Marital status: Married    Spouse name: Not on file  . Number of children: Not on file  . Years of education: Not on file  . Highest education level: Not on file  Occupational History  . Not on file  Tobacco Use  . Smoking status: Never Smoker  . Smokeless tobacco: Never Used  Substance and Sexual Activity  . Alcohol use: Yes    Comment: 10/14/2014 "might have a drink when I'm out once/month"  . Drug use: No  . Sexual activity: Not Currently  Other Topics Concern  . 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 Difficulty of Paying Living Expenses: Not on file  Food Insecurity:   . Worried About Charity fundraiser in the Last Year: Not on file  . Ran Out of Food in the Last Year: Not on file  Transportation Needs:   . Lack of Transportation (Medical): Not on file  . Lack of Transportation (Non-Medical): Not on file  Physical Activity:   . Days of Exercise per Week: Not on file  . Minutes of Exercise per Session: Not on file  Stress:   . Feeling of Stress : Not on file  Social Connections:   . Frequency of Communication with Friends and Family: Not on file  . Frequency of Social Gatherings with Friends and Family: Not on file  . Attends Religious Services: Not on file  . Active Member of Clubs or Organizations: Not on file  . Attends Archivist Meetings: Not on file  . Marital Status: Not on file     Family History:  The patient's family history includes Arthritis in her father, maternal grandfather, maternal grandmother, mother, paternal grandfather, and paternal grandmother; Diabetes in her brother, father, maternal grandmother, mother, paternal grandfather, and paternal grandmother; Heart disease in her father; Hyperlipidemia in her father, maternal grandfather, maternal grandmother, mother, paternal grandfather, and  paternal grandmother; Hypertension in her brother, maternal grandfather, maternal grandmother, mother, paternal grandfather, and paternal grandmother; Stroke in her brother.   ROS General: Negative; No fevers, chills, or night sweats;  HEENT: Negative; No changes in vision or hearing, sinus congestion, difficulty swallowing Pulmonary: Negative; No cough, wheezing, shortness of breath, hemoptysis Cardiovascular: Negative; No chest pain, presyncope, syncope, palpitations GI: Negative; No nausea, vomiting, diarrhea, or abdominal pain GU: Negative; No dysuria, hematuria, or difficulty voiding Musculoskeletal: Right total knee replacement Hematologic/Oncology: Negative; no easy bruising, bleeding Endocrine: Negative; no heat/cold intolerance; no diabetes Neuro: Negative; no changes in balance, headaches Skin: Negative; No rashes or skin lesions Psychiatric: Negative; No behavioral problems, depression Sleep: Negative; No snoring, daytime sleepiness, hypersomnolence, bruxism, restless legs, hypnogognic hallucinations, no cataplexy Other comprehensive 14 point system review is negative.   PHYSICAL EXAM:   VS:  BP (!) 154/76   Pulse (!) 54   Temp (!) 96.8 F (36 C)   Ht 5' 3" (1.6 m)   Wt 226 lb 12.8 oz (102.9 kg)   SpO2 97%   BMI 40.18 kg/m     Repeat blood pressure by me 168/80  Wt Readings from Last 3 Encounters:  09/09/19 226 lb 12.8 oz (102.9 kg)  01/27/19 216 lb (98 kg)  12/10/18 219 lb (99.3 kg)    General: Alert, oriented, no distress.  Skin: normal turgor, no rashes, warm and dry HEENT: Normocephalic, atraumatic. Pupils equal round and reactive to light; sclera anicteric; extraocular muscles intact;  Nose without nasal septal hypertrophy Mouth/Parynx benign; Mallinpatti scale 3 Neck: No JVD, no carotid bruits; normal carotid upstroke Lungs: clear to ausculatation and percussion; no wheezing or rales Chest wall: without tenderness to palpitation Heart: PMI not displaced,  RRR, s1 s2 normal, 1/6 systolic murmur, no diastolic murmur, no rubs, gallops, thrills, or heaves Abdomen: soft, nontender; no hepatosplenomehaly, BS+; abdominal aorta nontender and not dilated by palpation. Back: no CVA tenderness Pulses 2+ Musculoskeletal: full range of motion, normal strength, no joint deformities Extremities:  no clubbing cyanosis or edema, Homan's sign negative  Neurologic: grossly nonfocal; Cranial nerves grossly wnl Psychologic: Normal mood and affect   Studies/Labs Reviewed:   EKG:  EKG is ordered today.  ECG (independently read by me): Sinus bradycardia 54 bpm.  LVH by voltage.  Normal intervals.  No ectopy  Recent Labs: BMP Latest Ref Rng & Units 03/04/2019 12/11/2018 12/02/2018  Glucose 65 - 99 mg/dL 69 223(H) 155(H)  BUN 7 - 25 mg/dL 16 <5(L) 8  Creatinine 0.60 - 0.93 mg/dL 0.64 0.61 0.60  BUN/Creat Ratio 6 - 22 (calc) NOT APPLICABLE - -  Sodium 623 - 146 mmol/L 138 135 136  Potassium 3.5 - 5.3 mmol/L 4.2 3.7 4.0  Chloride 98 - 110 mmol/L 101 101 104  CO2 20 - 32 mmol/L 29 25 21(L)  Calcium 8.6 - 10.4 mg/dL 8.9 8.3(L) 9.1     Hepatic Function Latest Ref Rng & Units 03/04/2019 08/07/2018 01/31/2018  Total Protein 6.1 - 8.1 g/dL 5.6(L) 6.3 6.0  Albumin 3.5 - 5.2 g/dL - - 3.9  AST 10 - 35 U/L _0 ALT 6 - 29 U/L _1 Alk Phosphatase 39 - 117 U/L - - 59  Total Bilirubin 0.2 - 1.2 mg/dL 0.4 0.4 0.6  Bilirubin, Direct 0.0 - 0.3 mg/dL - - 0.1    CBC Latest Ref Rng & Units 07/04/2019 03/04/2019 12/12/2018  WBC 3.8 - 10.8 Thousand/uL 8.3 6.9 11.7(H)  Hemoglobin 11.7 - 15.5 g/dL 12.4 10.2(L) 8.8(L)  Hematocrit 35.0 - 45.0 % 39.1 33.7(L) 28.2(L)  Platelets 140 - 400 Thousand/uL 283 222 251   Lab Results  Component Value Date   MCV 85.6 07/04/2019   MCV 78.7 (L) 03/04/2019   MCV 82.0 12/12/2018   Lab Results  Component Value Date   TSH 2.14 03/04/2019   Lab Results  Component Value Date   HGBA1C 7.4 (H) 12/02/2018     BNP    Component Value  Date/Time   BNP 110.6 (H) 05/03/2018 0153    ProBNP No results found for: PROBNP   Lipid Panel     Component Value Date/Time   CHOL 138 08/07/2018 1530   TRIG 185 (H) 08/07/2018 1530   HDL 46 (L) 08/07/2018 1530   CHOLHDL 3.0 08/07/2018 1530   VLDL 22 05/03/2018 0153   LDLCALC 66 08/07/2018 1530   LDLDIRECT 42.0 01/31/2018 0943     RADIOLOGY: No results found.   Additional studies/ records that were reviewed today include:  Reviewed the patient's hospitalization from August 2019.  ------------------------------------------------------------------- ECHO 05/03/2018 Study Conclusions  - Left ventricle: The cavity size was normal. There was moderate   concentric hypertrophy. Systolic function was vigorous. The   estimated ejection fraction was in the range of 65% to 70%. Wall   motion was normal; there were no regional wall motion   abnormalities. Features are consistent with a pseudonormal left   ventricular filling pattern, with concomitant abnormal relaxation   and increased filling pressure (grade 2 diastolic dysfunction).   Doppler parameters are consistent with high ventricular filling   pressure. - Aortic valve: Trileaflet; mildly thickened, mildly calcified   leaflets. - Aorta: Aortic root dimension: 37 mm (ED). - Mitral valve: Calcified annulus. - Left atrium: The atrium was mildly to moderately dilated. - Pulmonary arteries: Systolic pressure could not be accurately   estimated.   ASSESSMENT:    1. Essential hypertension   2. PAF (paroxysmal atrial fibrillation) ([place])   3. Hypertensive heart disease  without heart failure   4. Long term (current) use of anticoagulants   5. Status post total right knee replacement   6. OSA (obstructive sleep apnea)   7. Morbid obesity with BMI of 40.0-44.9, adult ([place])   8. Medication management     PLAN:  Ms. Armilda "Diane " Dishner is a 68 year old female who has a history of diabetes mellitus since age 49 as well as a  history of hypertension, hyperlipidemia, obstructive sleep apnea and anxiety.  He developed an new onset episode of atrial fibrillation with RVR leading to hospitalization in August 2019 at which time she converted successfully with IV metoprolol to sinus rhythm.  Subsequently, she has been without recurrent awareness of arrhythmia.  Her blood pressure today is elevated and on repeat by me was 168/80 despite taking long-acting cold diltiazem 180 mg in addition to metoprolol tartrate 12.5 mg twice a day.  She has been maintained on warfarin for anticoagulation.  Her echo Doppler study was reviewed.  She was noted to have LVH with diastolic dysfunction.  I have recommended the addition of spironolactone initially at 12.5 mg to be helpful both for blood pressure control as well as her underlying diastolic dysfunction.  She is morbidly obese with a BMI of 40.18 and admits to 12 pound weight gain during this recent COVID-19 pandemic.  We discussed the importance of exercise at least 5 days/week of 30 minutes of moderate intensity.  She is diabetic on glipizide and Metformin.  She has a history of hyperlipidemia and with her underlying diabetes target LDL is less than 70.  She currently is on atorvastatin 40 mg in addition to omega-3 fatty acid.  She sees Research officer, political party for primary care.  Repeat laboratory will will need to be obtained in the fasting state.  I have recommended a follow-up evaluation with Kerin Ransom in 3 months and with me in 6 months.   Medication Adjustments/Labs and Tests Ordered: Current medicines are reviewed at length with the patient today.  Concerns regarding medicines are outlined above.  Medication changes, Labs and Tests ordered today are listed in the Patient Instructions below. Patient Instructions  Medication Instructions:  START spironolactone 12.28m daily Continue other current medications   *If you need a refill on your cardiac medications before your next appointment, please  call your pharmacy*  Lab Work: BMET in 1 week If you have labs (blood work) drawn today and your tests are completely normal, you will receive your results only by: .Marland KitchenMyChart Message (if you have MyChart) OR . A paper copy in the mail If you have any lab test that is abnormal or we need to change your treatment, we will call you to review the results.  Testing/Procedures: NONE  Follow-Up: [facility].  As part of our continuing mission to provide you with exceptional heart care, we have created designated Provider Care Teams.  These Care Teams include your primary Cardiologist (physician) and Advanced Practice Providers (APPs -  Physician Assistants and Nurse Practitioners) who all work together to provide you with the care you need, when you need it.  Your next appointment:   3 months with LLurena JoinerPA  6 months with Dr. KClaiborne Billings     Signed, TShelva Majestic MD  09/16/2019 2:05 PM    [address] [place]  [id number]: [phone]

## 2019-09-09 NOTE — Patient Instructions (Signed)
Medication Instructions:  START spironolactone 12.5mg  daily Continue other current medications   *If you need a refill on your cardiac medications before your next appointment, please call your pharmacy*  Lab Work: BMET in 1 week If you have labs (blood work) drawn today and your tests are completely normal, you will receive your results only by: Marland Kitchen MyChart Message (if you have MyChart) OR . A paper copy in the mail If you have any lab test that is abnormal or we need to change your treatment, we will call you to review the results.  Testing/Procedures: NONE  Follow-Up: At [facility], you and your health needs are our priority.  As part of our continuing mission to provide you with exceptional heart care, we have created designated Provider Care Teams.  These Care Teams include your primary Cardiologist (physician) and Advanced Practice Providers (APPs -  Physician Assistants and Nurse Practitioners) who all work together to provide you with the care you need, when you need it.  Your next appointment:   3 months with [facility] PA  6 months with Dr. Claiborne Billings

## 2019-09-10 ENCOUNTER — Encounter (INDEPENDENT_AMBULATORY_CARE_PROVIDER_SITE_OTHER): Payer: Self-pay | Admitting: Family Medicine

## 2019-09-11 MED ORDER — ATORVASTATIN CALCIUM 40 MG PO TABS: 40.0000 mg | ORAL_TABLET | Freq: Every day | ORAL | 3 refills | Status: DC

## 2019-09-15 ENCOUNTER — Other Ambulatory Visit (INDEPENDENT_AMBULATORY_CARE_PROVIDER_SITE_OTHER): Payer: Self-pay | Admitting: Family Medicine

## 2019-09-16 ENCOUNTER — Encounter (INDEPENDENT_AMBULATORY_CARE_PROVIDER_SITE_OTHER): Payer: Self-pay | Admitting: Family Medicine

## 2019-09-16 ENCOUNTER — Encounter: Payer: Self-pay | Admitting: Cardiovascular Disease

## 2019-09-16 ENCOUNTER — Other Ambulatory Visit (INDEPENDENT_AMBULATORY_CARE_PROVIDER_SITE_OTHER): Payer: Self-pay | Admitting: Family Medicine

## 2019-09-16 MED ORDER — ESZOPICLONE 3 MG PO TABS: 3.0000 mg | ORAL_TABLET | Freq: Every evening | ORAL | 1 refills | Status: DC | PRN

## 2019-09-16 NOTE — Telephone Encounter (Signed)
Hilts patient.  

## 2019-09-19 ENCOUNTER — Ambulatory Visit (INDEPENDENT_AMBULATORY_CARE_PROVIDER_SITE_OTHER): Payer: Medicare Other | Admitting: Pharmacist

## 2019-09-19 DIAGNOSIS — Z7901 Long term (current) use of anticoagulants: Secondary | ICD-10-CM | POA: Diagnosis not present

## 2019-09-19 DIAGNOSIS — I4891 Unspecified atrial fibrillation: Secondary | ICD-10-CM | POA: Diagnosis not present

## 2019-09-19 LAB — POCT INR: INR: 2 (ref 2.0–3.0)

## 2019-09-20 LAB — BASIC METABOLIC PANEL
BUN/Creatinine Ratio: 20 (ref 12–28)
BUN: 14 mg/dL (ref 8–27)
CO2: 25 mmol/L (ref 20–29)
Calcium: 9.7 mg/dL (ref 8.7–10.3)
Chloride: 99 mmol/L (ref 96–106)
Creatinine, Ser: 0.7 mg/dL (ref 0.57–1.00)
GFR calc Af Amer: 102 mL/min/{1.73_m2} (ref >59)
GFR calc non Af Amer: 88 mL/min/{1.73_m2} (ref >59)
Glucose: 110 mg/dL — ABNORMAL HIGH (ref 65–99)
Potassium: 5 mmol/L (ref 3.5–5.2)
Sodium: 139 mmol/L (ref 134–144)

## 2019-10-13 ENCOUNTER — Encounter (INDEPENDENT_AMBULATORY_CARE_PROVIDER_SITE_OTHER): Payer: Self-pay | Admitting: Family Medicine

## 2019-10-13 MED ORDER — BACLOFEN 10 MG PO TABS: 10.0000 mg | ORAL_TABLET | Freq: Three times a day (TID) | ORAL | 3 refills | Status: DC | PRN

## 2019-10-17 ENCOUNTER — Encounter (INDEPENDENT_AMBULATORY_CARE_PROVIDER_SITE_OTHER): Payer: Self-pay | Admitting: Family Medicine

## 2019-10-17 ENCOUNTER — Other Ambulatory Visit: Payer: Self-pay | Admitting: Family Medicine

## 2019-10-17 MED ORDER — TIZANIDINE HCL 2 MG PO TABS: 2.0000 mg | ORAL_TABLET | Freq: Four times a day (QID) | ORAL | 1 refills | Status: DC | PRN

## 2019-10-20 ENCOUNTER — Telehealth: Payer: Self-pay

## 2019-10-20 MED ORDER — ESZOPICLONE 3 MG PO TABS: 3.0000 mg | ORAL_TABLET | Freq: Every evening | ORAL | 1 refills | Status: DC | PRN

## 2019-10-20 NOTE — Telephone Encounter (Signed)
lmomed the pt to schedule coumadin check.

## 2019-10-20 NOTE — Telephone Encounter (Signed)
-----   Message from [place], [facility] sent at 10/17/2019  2:57 PM EST ----- Regarding: Re-schedule Patien cancelled her Coumadin appt. Tried to call her twice to re-schedule without luck.   Please call her back and re-schedule her INR check for early February (after back injection)

## 2019-10-21 ENCOUNTER — Encounter (INDEPENDENT_AMBULATORY_CARE_PROVIDER_SITE_OTHER): Payer: Self-pay | Admitting: Family Medicine

## 2019-10-21 MED ORDER — CLONAZEPAM 0.5 MG PO TABS: ORAL_TABLET | ORAL | 0 refills | Status: DC

## 2019-10-23 DIAGNOSIS — M5136 Other intervertebral disc degeneration, lumbar region: Secondary | ICD-10-CM | POA: Diagnosis not present

## 2019-10-23 DIAGNOSIS — M48061 Spinal stenosis, lumbar region without neurogenic claudication: Secondary | ICD-10-CM | POA: Diagnosis not present

## 2019-10-23 DIAGNOSIS — M4726 Other spondylosis with radiculopathy, lumbar region: Secondary | ICD-10-CM | POA: Diagnosis not present

## 2019-11-03 ENCOUNTER — Other Ambulatory Visit: Payer: Self-pay | Admitting: Pharmacist Clinician (PhC)/ Clinical Pharmacy Specialist

## 2019-11-03 MED ORDER — ROSUVASTATIN CALCIUM 20 MG PO TABS: 20.0000 mg | ORAL_TABLET | Freq: Every day | ORAL | 3 refills | Status: DC

## 2019-11-18 ENCOUNTER — Telehealth: Payer: Self-pay | Admitting: *Deleted

## 2019-11-18 DIAGNOSIS — Z1231 Encounter for screening mammogram for malignant neoplasm of breast: Secondary | ICD-10-CM | POA: Diagnosis not present

## 2019-11-18 NOTE — Telephone Encounter (Signed)
A message was left, re: her follow up visit. 

## 2019-11-19 ENCOUNTER — Encounter (INDEPENDENT_AMBULATORY_CARE_PROVIDER_SITE_OTHER): Payer: Self-pay | Admitting: Family Medicine

## 2019-11-19 ENCOUNTER — Ambulatory Visit (INDEPENDENT_AMBULATORY_CARE_PROVIDER_SITE_OTHER): Payer: Medicare Other | Admitting: Pharmacist

## 2019-11-19 ENCOUNTER — Other Ambulatory Visit: Payer: Self-pay

## 2019-11-19 DIAGNOSIS — D649 Anemia, unspecified: Secondary | ICD-10-CM

## 2019-11-19 DIAGNOSIS — I4891 Unspecified atrial fibrillation: Secondary | ICD-10-CM | POA: Diagnosis not present

## 2019-11-19 DIAGNOSIS — E119 Type 2 diabetes mellitus without complications: Secondary | ICD-10-CM

## 2019-11-19 DIAGNOSIS — Z7901 Long term (current) use of anticoagulants: Secondary | ICD-10-CM

## 2019-11-19 LAB — POCT INR: INR: 1.7 — AB (ref 2.0–3.0)

## 2019-11-19 NOTE — Addendum Note (Signed)
Addended by: Marlyne Beards on: 11/19/2019 01:45 PM   Modules accepted: Orders

## 2019-11-19 NOTE — Addendum Note (Signed)
Addended by: Marlyne Beards on: 11/19/2019 12:55 PM   Modules accepted: Orders

## 2019-11-19 NOTE — Addendum Note (Signed)
Addended by: Marlyne Beards on: 11/19/2019 12:54 PM   Modules accepted: Orders

## 2019-11-19 NOTE — Addendum Note (Signed)
Addended by: Marlyne Beards on: 11/19/2019 01:46 PM   Modules accepted: Orders

## 2019-11-21 ENCOUNTER — Encounter (INDEPENDENT_AMBULATORY_CARE_PROVIDER_SITE_OTHER): Payer: Self-pay | Admitting: Family Medicine

## 2019-11-23 ENCOUNTER — Other Ambulatory Visit (INDEPENDENT_AMBULATORY_CARE_PROVIDER_SITE_OTHER): Payer: Self-pay | Admitting: Family Medicine

## 2019-11-25 ENCOUNTER — Encounter (INDEPENDENT_AMBULATORY_CARE_PROVIDER_SITE_OTHER): Payer: Self-pay | Admitting: Family Medicine

## 2019-11-25 ENCOUNTER — Other Ambulatory Visit (INDEPENDENT_AMBULATORY_CARE_PROVIDER_SITE_OTHER): Payer: Self-pay | Admitting: Family Medicine

## 2019-11-25 MED ORDER — CLONAZEPAM 0.5 MG PO TABS: ORAL_TABLET | ORAL | 0 refills | Status: DC

## 2019-11-25 MED ORDER — TIZANIDINE HCL 2 MG PO TABS: 2.0000 mg | ORAL_TABLET | Freq: Four times a day (QID) | ORAL | 1 refills | Status: DC | PRN

## 2019-11-26 ENCOUNTER — Ambulatory Visit (INDEPENDENT_AMBULATORY_CARE_PROVIDER_SITE_OTHER): Payer: Medicare Other

## 2019-11-26 ENCOUNTER — Other Ambulatory Visit: Payer: Self-pay

## 2019-11-26 ENCOUNTER — Ambulatory Visit: Payer: Medicare Other | Admitting: Orthopaedic Surgery

## 2019-11-26 DIAGNOSIS — I10 Essential (primary) hypertension: Secondary | ICD-10-CM

## 2019-11-26 DIAGNOSIS — E119 Type 2 diabetes mellitus without complications: Secondary | ICD-10-CM | POA: Diagnosis not present

## 2019-11-26 DIAGNOSIS — D649 Anemia, unspecified: Secondary | ICD-10-CM

## 2019-11-26 NOTE — Progress Notes (Signed)
Patient came in for lab work per Dr. Junius Roads:  microalbumin (urine), HgbA1C, Fe/TIBC/ferritin panel, CMET & CBC w/diff.

## 2019-11-27 ENCOUNTER — Encounter (INDEPENDENT_AMBULATORY_CARE_PROVIDER_SITE_OTHER): Payer: Self-pay | Admitting: Family Medicine

## 2019-11-27 ENCOUNTER — Telehealth: Payer: Self-pay | Admitting: Family Medicine

## 2019-11-27 DIAGNOSIS — E119 Type 2 diabetes mellitus without complications: Secondary | ICD-10-CM

## 2019-11-27 LAB — COMPREHENSIVE METABOLIC PANEL
AG Ratio: 1.8 (calc) (ref 1.0–2.5)
ALT: 17 U/L (ref 6–29)
AST: 12 U/L (ref 10–35)
Albumin: 3.8 g/dL (ref 3.6–5.1)
Alkaline phosphatase (APISO): 70 U/L (ref 37–153)
BUN/Creatinine Ratio: 22 (calc) (ref 6–22)
BUN: 13 mg/dL (ref 7–25)
CO2: 32 mmol/L (ref 20–32)
Calcium: 9.3 mg/dL (ref 8.6–10.4)
Chloride: 98 mmol/L (ref 98–110)
Creat: 0.59 mg/dL — ABNORMAL LOW (ref 0.60–0.93)
Globulin: 2.1 g/dL (calc) (ref 1.9–3.7)
Glucose, Bld: 157 mg/dL — ABNORMAL HIGH (ref 65–99)
Potassium: 4.7 mmol/L (ref 3.5–5.3)
Sodium: 137 mmol/L (ref 135–146)
Total Bilirubin: 0.4 mg/dL (ref 0.2–1.2)
Total Protein: 5.9 g/dL — ABNORMAL LOW (ref 6.1–8.1)

## 2019-11-27 LAB — CBC WITH DIFFERENTIAL/PLATELET
Absolute Monocytes: 650 cells/uL (ref 200–950)
Basophils Absolute: 20 cells/uL (ref 0–200)
Basophils Relative: 0.3 %
Eosinophils Absolute: 130 cells/uL (ref 15–500)
Eosinophils Relative: 2 %
HCT: 39.5 % (ref 35.0–45.0)
Hemoglobin: 13 g/dL (ref 11.7–15.5)
Lymphs Abs: 2340 cells/uL (ref 850–3900)
MCH: 29.5 pg (ref 27.0–33.0)
MCHC: 32.9 g/dL (ref 32.0–36.0)
MCV: 89.6 fL (ref 80.0–100.0)
MPV: 9.6 fL (ref 7.5–12.5)
Monocytes Relative: 10 %
Neutro Abs: 3361 cells/uL (ref 1500–7800)
Neutrophils Relative %: 51.7 %
Platelets: 246 10*3/uL (ref 140–400)
RBC: 4.41 10*6/uL (ref 3.80–5.10)
RDW: 14.4 % (ref 11.0–15.0)
Total Lymphocyte: 36 %
WBC: 6.5 10*3/uL (ref 3.8–10.8)

## 2019-11-27 LAB — MICROALBUMIN, URINE: Microalb, Ur: <0.2 mg/dL

## 2019-11-27 LAB — IRON,TIBC AND FERRITIN PANEL
%SAT: 23 % (calc) (ref 16–45)
Ferritin: 19 ng/mL (ref 16–288)
Iron: 78 ug/dL (ref 45–160)
TIBC: 343 mcg/dL (calc) (ref 250–450)

## 2019-11-27 LAB — HEMOGLOBIN A1C
Hgb A1c MFr Bld: 8 % of total Hgb — ABNORMAL HIGH (ref <5.7)
Mean Plasma Glucose: 183 (calc)
eAG (mmol/L): 10.1 (calc)

## 2019-11-27 NOTE — Telephone Encounter (Signed)
Diabetes is not doing well (A1C 8.0).    Other labs look ok.

## 2019-11-29 ENCOUNTER — Encounter (INDEPENDENT_AMBULATORY_CARE_PROVIDER_SITE_OTHER): Payer: Self-pay | Admitting: Family Medicine

## 2019-12-01 ENCOUNTER — Ambulatory Visit (INDEPENDENT_AMBULATORY_CARE_PROVIDER_SITE_OTHER): Payer: Medicare Other

## 2019-12-01 ENCOUNTER — Ambulatory Visit (INDEPENDENT_AMBULATORY_CARE_PROVIDER_SITE_OTHER): Payer: Medicare Other | Admitting: Orthopaedic Surgery

## 2019-12-01 ENCOUNTER — Ambulatory Visit: Payer: Medicare Other | Admitting: Orthopaedic Surgery

## 2019-12-01 ENCOUNTER — Encounter: Payer: Self-pay | Admitting: Orthopaedic Surgery

## 2019-12-01 ENCOUNTER — Other Ambulatory Visit: Payer: Self-pay

## 2019-12-01 DIAGNOSIS — Z96651 Presence of right artificial knee joint: Secondary | ICD-10-CM | POA: Diagnosis not present

## 2019-12-01 NOTE — Progress Notes (Signed)
Office Visit Note   Patient: Debra Wright           Date of Birth: 01-24-1951           MRN: [id number] Visit Date: 12/01/2019              Requested by: Eunice Blase, MD [address] [place],  [place] [address] PCP: Eunice Blase, MD   Assessment & Plan: Visit Diagnoses:  1. History of total right knee replacement     Plan: From a knee standpoint, she can follow-up as needed since she is doing well.  She understands that if she develops any type of swelling or issues with her knees not hesitate to see [place].  I would have her try hydrocortisone cream for the Achilles area where she has dry skin.  All questions and concerns were answered and addressed.  Follow-up can be as needed at this standpoint.  Follow-Up Instructions: Return if symptoms worsen or fail to improve.   Orders:  Orders Placed This Encounter  Procedures  . XR Knee 1-2 Views Right   No orders of the defined types were placed in this encounter.     Procedures: No procedures performed   Clinical Data: No additional findings.   Subjective: Chief Complaint  Patient presents with  . Right Knee - Follow-up  The patient is getting close to a year out from a right total knee arthroplasty.  This was done with the press-fit knee system.  She is many years out from a left knee replacement.  She says the knees are doing well and she has no swelling and she is very pleased with her knees.  She does have a history of a right heel injury with a large laceration around the Achilles area.  She wanted [place] to just take a look at that today because has been itching and a little bit painful to her.  She says that her right foot tends to slap the ground when she walks.  She does see Dr. Sherwood Gambler for her spine.  HPI  Review of Systems She currently denies any headache, chest pain, shortness of breath, fever, chills, nausea, vomiting  Objective: Vital Signs: There were no vitals taken for this visit.  Physical Exam She  is alert and orient x3 and in no acute distress Ortho Exam Examination of both knees show fluid range of motion that is full.  Both knees are ligamentously stable.  Both knees have no effusion and normal alignment.  Examination of her right Achilles shows a well-healed scar from her previous laceration that was traumatic.  Her [place] Silos test is negative.  There is no evidence of infection or redness or swelling.  There is some dry skin around the laceration area that is healed as well as the posterior calcaneus. Specialty Comments:  No specialty comments available.  Imaging: XR Knee 1-2 Views Right  Result Date: 12/01/2019 2 views of the right knee show a total knee arthroplasty with no complicating features.  The AP view also shows a left knee that appears to have a replacement that is uncomplicated.    PMFS History: Patient Active Problem List   Diagnosis Date Noted  . Unilateral primary osteoarthritis, right knee 12/10/2018  . Status post total right knee replacement 12/10/2018  . Long term (current) use of anticoagulants 08/16/2018  . Hypertensive cardiovascular disease 06/04/2018  . Morbid obesity with BMI of 40.0-44.9, adult ([place]) 06/04/2018  . Fatigue 06/04/2018  . Chronic anticoagulation   .  Atrial fibrillation and flutter ([place]) 05/02/2018  . Atrial fibrillation with RVR ([place]) 05/02/2018  . PAF (paroxysmal atrial fibrillation) ([place]) 05/02/2018  . Primary osteoarthritis of right knee 06/14/2017  . OSA (obstructive sleep apnea) 02/13/2017  . NIDDM-type 2 10/31/2016  . Essential hypertension 08/02/2016  . Anxiety and depression 08/02/2016  . Insomnia 08/02/2016  . Arthritis of left knee 10/13/2014  . Status post total left knee replacement 10/13/2014   Past Medical History:  Diagnosis Date  . Anxiety   . Arthritis    "knees; right thumb" (10/14/2014)  . Basal cell carcinoma    "burned off upper lip & right shoulder"  . Chronic anticoagulation    CHADS VASC=3  .  Complication of anesthesia    after spinal anesthesia for left knee replacement, bladder did "not wake up". Was incontinent for 4 days post surgery  . Depression   . Fibroid tumor   . Frequent diarrhea   . GERD (gastroesophageal reflux disease)   . High cholesterol   . Hypertension    EF 65-70% grade 2DD  . Insomnia   . Migraine    "frequently when I was younger; maybe monthly now" (10/14/2014) - none after hysterectomy  . PAF (paroxysmal atrial fibrillation) ([place]) 05/02/2018   converted with rate control  . Pernicious anemia    pt not aware of this  . Sleep apnea    uses a cpap  . Type II diabetes mellitus ([place])   . Vitamin B 12 deficiency     Family History  Problem Relation Age of Onset  . Arthritis Mother   . Hyperlipidemia Mother   . Diabetes Mother   . Hypertension Mother   . Arthritis Father   . Hyperlipidemia Father   . Heart disease Father   . Diabetes Father   . Stroke Brother   . Hypertension Brother   . Diabetes Brother   . Arthritis Maternal Grandmother   . Hyperlipidemia Maternal Grandmother   . Diabetes Maternal Grandmother   . Hypertension Maternal Grandmother   . Arthritis Maternal Grandfather   . Hyperlipidemia Maternal Grandfather   . Hypertension Maternal Grandfather   . Arthritis Paternal Grandmother   . Hyperlipidemia Paternal Grandmother   . Diabetes Paternal Grandmother   . Hypertension Paternal Grandmother   . Arthritis Paternal Grandfather   . Hyperlipidemia Paternal Grandfather   . Diabetes Paternal Grandfather   . Hypertension Paternal Grandfather     Past Surgical History:  Procedure Laterality Date  . ABDOMINAL HYSTERECTOMY  1988  . ACHILLES TENDON SURGERY Right   . APPENDECTOMY  1988  . BILATERAL OOPHORECTOMY  2004  . CARPAL TUNNEL RELEASE Bilateral 1986  . CATARACT EXTRACTION W/ INTRAOCULAR LENS  IMPLANT, BILATERAL Bilateral 2011  . COLONOSCOPY    . JOINT REPLACEMENT    . SHOULDER ARTHROSCOPY DISTAL CLAVICLE EXCISION AND OPEN  ROTATOR CUFF REPAIR Right 11/2013  . TOENAIL EXCISION Right 04/2018   "big toe"  . TONSILLECTOMY  1950's  . TOTAL KNEE ARTHROPLASTY Left 10/13/2014   Procedure: LEFT TOTAL KNEE ARTHROPLASTY;  Surgeon: Mcarthur Rossetti, MD;  Location: [facility];  Service: Orthopedics;  Laterality: Left;  . TOTAL KNEE ARTHROPLASTY Right 12/10/2018   Procedure: RIGHT TOTAL KNEE ARTHROPLASTY;  Surgeon: Mcarthur Rossetti, MD;  Location: [facility];  Service: Orthopedics;  Laterality: Right;   Social History   Occupational History  . Not on file  Tobacco Use  . Smoking status: Never Smoker  . Smokeless tobacco: Never Used  Substance and Sexual  Activity  . Alcohol use: Yes    Comment: 10/14/2014 "might have a drink when I'm out once/month"  . Drug use: No  . Sexual activity: Not Currently

## 2019-12-02 ENCOUNTER — Other Ambulatory Visit (INDEPENDENT_AMBULATORY_CARE_PROVIDER_SITE_OTHER): Payer: Self-pay | Admitting: Family Medicine

## 2019-12-02 DIAGNOSIS — F419 Anxiety disorder, unspecified: Secondary | ICD-10-CM

## 2019-12-02 DIAGNOSIS — Z760 Encounter for issue of repeat prescription: Secondary | ICD-10-CM

## 2019-12-02 DIAGNOSIS — F329 Major depressive disorder, single episode, unspecified: Secondary | ICD-10-CM

## 2019-12-09 DIAGNOSIS — Z6841 Body Mass Index (BMI) 40.0 and over, adult: Secondary | ICD-10-CM | POA: Diagnosis not present

## 2019-12-09 DIAGNOSIS — I10 Essential (primary) hypertension: Secondary | ICD-10-CM | POA: Diagnosis not present

## 2019-12-09 DIAGNOSIS — M4310 Spondylolisthesis, site unspecified: Secondary | ICD-10-CM | POA: Diagnosis not present

## 2019-12-09 DIAGNOSIS — M4300 Spondylolysis, site unspecified: Secondary | ICD-10-CM | POA: Diagnosis not present

## 2019-12-09 DIAGNOSIS — M48062 Spinal stenosis, lumbar region with neurogenic claudication: Secondary | ICD-10-CM | POA: Diagnosis not present

## 2019-12-09 DIAGNOSIS — M5136 Other intervertebral disc degeneration, lumbar region: Secondary | ICD-10-CM | POA: Diagnosis not present

## 2019-12-09 DIAGNOSIS — M47816 Spondylosis without myelopathy or radiculopathy, lumbar region: Secondary | ICD-10-CM | POA: Diagnosis not present

## 2019-12-09 DIAGNOSIS — M5416 Radiculopathy, lumbar region: Secondary | ICD-10-CM | POA: Diagnosis not present

## 2019-12-10 ENCOUNTER — Ambulatory Visit (INDEPENDENT_AMBULATORY_CARE_PROVIDER_SITE_OTHER): Payer: Medicare Other | Admitting: Pharmacist

## 2019-12-10 ENCOUNTER — Other Ambulatory Visit: Payer: Self-pay

## 2019-12-10 DIAGNOSIS — Z7901 Long term (current) use of anticoagulants: Secondary | ICD-10-CM | POA: Diagnosis not present

## 2019-12-10 DIAGNOSIS — I4891 Unspecified atrial fibrillation: Secondary | ICD-10-CM | POA: Diagnosis not present

## 2019-12-10 LAB — POCT INR: INR: 1.5 — AB (ref 2.0–3.0)

## 2019-12-11 ENCOUNTER — Ambulatory Visit (INDEPENDENT_AMBULATORY_CARE_PROVIDER_SITE_OTHER): Payer: Medicare Other | Admitting: Pulmonary Disease

## 2019-12-11 ENCOUNTER — Other Ambulatory Visit: Payer: Self-pay | Admitting: General Surgery

## 2019-12-11 ENCOUNTER — Other Ambulatory Visit: Payer: Self-pay | Admitting: Cardiovascular Disease

## 2019-12-11 ENCOUNTER — Encounter: Payer: Self-pay | Admitting: Pulmonary Disease

## 2019-12-11 DIAGNOSIS — I4891 Unspecified atrial fibrillation: Secondary | ICD-10-CM | POA: Diagnosis not present

## 2019-12-11 DIAGNOSIS — G4733 Obstructive sleep apnea (adult) (pediatric): Secondary | ICD-10-CM

## 2019-12-11 DIAGNOSIS — I4892 Unspecified atrial flutter: Secondary | ICD-10-CM

## 2019-12-11 NOTE — Assessment & Plan Note (Signed)
Average CPAP pressure is 13 cm Change to auto 10 to 15 cm  CPAP supplies will be renewed for a year  High cardiovascular risk, implications of OSA for atrial fibrillation discussed She is very compliant and CPAP is helped improve her daytime somnolence and fatigue  Weight loss encouraged, compliance with goal of at least 4-6 hrs every night is the expectation. Advised against medications with sedative side effects Cautioned against driving when sleepy - understanding that sleepiness will vary on a day to day basis

## 2019-12-11 NOTE — Progress Notes (Signed)
   Subjective:    Patient ID: Debra Wright, female    DOB: 25-Mar-1951, 69 y.o.   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 She has sleep maintenance insomnia,multiple medications over the past few years including Ambien, Benadryl, Tylenol PM and melatonin.She has been maintained on Lunesta 3 mgsince 2014  PMH - atrial fib   Last visit 05/2018 reviewed, since then she has undergone right knee replacement. Maintained on anticoagulation for atrial fibrillation, Eliquis was too expensive, on warfarin but INR seems to fluctuate a lot  Compliant with CPAP, has finally switched to nasal cradle mask and 30 and she seems to like it has only used it for 2-3 nights.  CPAP report reviewed which shows excellent compliance, average pressure of 13 cm on auto settings 10 to 18 cm with no residual events and mild leak  Significant tests/ events reviewed  HST6/2018severe OSA with 35 events per hour Auto CPAP 10-18 >> pressure 13  Review of Systems Patient denies significant dyspnea,cough, hemoptysis,  chest pain, palpitations, pedal edema, orthopnea, paroxysmal nocturnal dyspnea, lightheadedness, nausea, vomiting, abdominal or  leg pains      Objective:   Physical Exam  Gen. Pleasant, obese, in no distress ENT - no lesions, no post nasal drip Neck: No JVD, no thyromegaly, no carotid bruits Lungs: no use of accessory muscles, no dullness to percussion, decreased without rales or rhonchi  Cardiovascular: Rhythm regular, heart sounds  normal, no murmurs or gallops, no peripheral edema Musculoskeletal: No deformities, no cyanosis or clubbing , no tremors       Assessment & Plan:

## 2019-12-11 NOTE — Assessment & Plan Note (Signed)
Correlation between OSA and atrial fibrillation discussed

## 2019-12-11 NOTE — Patient Instructions (Signed)
  CPAP is working well on current settings. Change to auto 10 to 15 cm  CPAP supplies will be renewed for a year

## 2019-12-15 ENCOUNTER — Ambulatory Visit: Payer: Medicare Other | Admitting: Adult Health

## 2019-12-17 ENCOUNTER — Telehealth: Payer: Self-pay | Admitting: *Deleted

## 2019-12-17 NOTE — Telephone Encounter (Signed)
Attempted 1 year post-op call to patient. No answer. Left VM on phone requesting call back.

## 2019-12-24 ENCOUNTER — Telehealth: Payer: Self-pay | Admitting: *Deleted

## 2019-12-24 NOTE — Telephone Encounter (Signed)
1 year Ortho bundle call attempted. Left VM on patient's home/cell number and requested call back.

## 2019-12-26 ENCOUNTER — Other Ambulatory Visit: Payer: Self-pay

## 2019-12-26 ENCOUNTER — Encounter: Payer: Self-pay | Admitting: Cardiology

## 2019-12-26 ENCOUNTER — Telehealth: Payer: Self-pay | Admitting: *Deleted

## 2019-12-26 ENCOUNTER — Ambulatory Visit (INDEPENDENT_AMBULATORY_CARE_PROVIDER_SITE_OTHER): Payer: Medicare Other | Admitting: Pharmacist Clinician (PhC)/ Clinical Pharmacy Specialist

## 2019-12-26 ENCOUNTER — Ambulatory Visit (INDEPENDENT_AMBULATORY_CARE_PROVIDER_SITE_OTHER): Payer: Medicare Other | Admitting: Cardiology

## 2019-12-26 VITALS — BP 132/68 | HR 65 | Ht 64.0 in | Wt 242.4 lb

## 2019-12-26 DIAGNOSIS — I119 Hypertensive heart disease without heart failure: Secondary | ICD-10-CM | POA: Diagnosis not present

## 2019-12-26 DIAGNOSIS — E119 Type 2 diabetes mellitus without complications: Secondary | ICD-10-CM | POA: Diagnosis not present

## 2019-12-26 DIAGNOSIS — G4733 Obstructive sleep apnea (adult) (pediatric): Secondary | ICD-10-CM

## 2019-12-26 DIAGNOSIS — Z7901 Long term (current) use of anticoagulants: Secondary | ICD-10-CM

## 2019-12-26 DIAGNOSIS — I4891 Unspecified atrial fibrillation: Secondary | ICD-10-CM | POA: Diagnosis not present

## 2019-12-26 DIAGNOSIS — I480 Paroxysmal atrial fibrillation: Secondary | ICD-10-CM

## 2019-12-26 LAB — POCT INR: INR: 2.1 (ref 2.0–3.0)

## 2019-12-26 MED ORDER — MECLIZINE HCL 12.5 MG PO TABS: 12.5000 mg | ORAL_TABLET | Freq: Three times a day (TID) | ORAL | 1 refills | Status: DC | PRN

## 2019-12-26 NOTE — Patient Instructions (Signed)
Medication Instructions:  Your physician has recommended you make the following change in your medication:  1. Start Meclizine one table (12.5 mg) three times a day for 5 days.  Sent into requested pharmacy.   *If you need a refill on your cardiac medications before your next appointment, please call your pharmacy*   Lab Work: Your physician recommends that you return for lab work in two weeks, Friday, April 9 between 8-4 no fasting  If you have labs (blood work) drawn today and your tests are completely normal, you will receive your results only by: Marland Kitchen MyChart Message (if you have MyChart) OR . A paper copy in the mail If you have any lab test that is abnormal or we need to change your treatment, we will call you to review the results.   Testing/Procedures: -None    Follow-Up: At [facility], you and your health needs are our priority.  As part of our continuing mission to provide you with exceptional heart care, we have created designated Provider Care Teams.  These Care Teams include your primary Cardiologist (physician) and Advanced Practice Providers (APPs -  Physician Assistants and Nurse Practitioners) who all work together to provide you with the care you need, when you need it.  We recommend signing up for the patient portal called "MyChart".  Sign up information is provided on this After Visit Summary.  MyChart is used to connect with patients for Virtual Visits (Telemedicine).  Patients are able to view lab/test results, encounter notes, upcoming appointments, etc.  Non-urgent messages can be sent to your provider as well.   To learn more about what you can do with MyChart, go to [email or web address].    Your next appointment:   3 month(s)  Virtual visit on Friday June 4 @ 4:00 with Dr. Claiborne Billings.   The format for your next appointment:   Virtual Visit   Provider:   Shelva Majestic, MD   Other Instructions Will give Dr.Kelly's nurse paperwork for Eliquis.

## 2019-12-26 NOTE — Progress Notes (Signed)
Cardiology Office Note:    Date:  12/26/2019   ID:  Debra, Wright 08-04-1951, MRN [id number]  PCP:  Eunice Blase, MD  Cardiologist:  Shelva Majestic, MD  Electrophysiologist:  None   Referring MD: Eunice Blase, MD   No chief complaint on file. 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 The patient has a a history of hypertension, type 2 diabetes mellitus, obstructive sleep apneaon C-pap,hyperlipidemia and anxiety. On 8/1/19she had a steroid injection to her right knee. On returning home she had acute onset of palpitations. This was associated with chest pain and diaphoresis.The EMS tracing recorded atrial fibrillation with rapid ventricular rate.In the ED herheart rate was between 150s and 180sandshe was given IV metoprolol (5 mg) and eventually converted to normal sinus rhythm.Lopressor PO was added and her Amlodipine was changed to DiltiazemCD 120mg . Echo in the hospital showed her EF to be normal withmoderate LVH,grade 2DD, AOV Ca++ without stenosis,and mild to moderate LAE. OP Myoview 05/16/18 was negative for ischemia. She was seen in the office 06/04/18. She had notes some fatigue and her HR was 50. I decreased her Lopressor to 12.5 mg BID. She also has had an L-spine injecion (off Eliquis) with good results. Dr Claiborne Billings added Aldactone in Dec.  The patient is in the office today for routine follow up.  She complains of dizziness, especially when laying down and turning her head from side to side.  She has been complaint with her medications and her C-pap.  During her hospitalization she was placed on Mg++ for a Mg++ of 1.3.  This was never followed up and the patient did report intermittent diarrhea which she attributed to one of her other medications.  Her HR has been stable- no tachycardia.   Past Medical History:  Diagnosis Date  .  Anxiety   . Arthritis    "knees; right thumb" (10/14/2014)  . Basal cell carcinoma    "burned off upper lip & right shoulder"  . Chronic anticoagulation    CHADS VASC=3  . Complication of anesthesia    after spinal anesthesia for left knee replacement, bladder did "not wake up". Was incontinent for 4 days post surgery  . Depression   . Fibroid tumor   . Frequent diarrhea   . GERD (gastroesophageal reflux disease)   . High cholesterol   . Hypertension    EF 65-70% grade 2DD  . Insomnia   . Migraine    "frequently when I was younger; maybe monthly now" (10/14/2014) - none after hysterectomy  . PAF (paroxysmal atrial fibrillation) ([place]) 05/02/2018   converted with rate control  . Pernicious anemia    pt not aware of this  . Sleep apnea    uses a cpap  . Type II diabetes mellitus ([place])   . Vitamin B 12 deficiency     Past Surgical History:  Procedure Laterality Date  . ABDOMINAL HYSTERECTOMY  1988  . ACHILLES TENDON SURGERY Right   . APPENDECTOMY  1988  . BILATERAL OOPHORECTOMY  2004  . CARPAL TUNNEL RELEASE Bilateral 1986  . CATARACT EXTRACTION W/ INTRAOCULAR LENS  IMPLANT, BILATERAL Bilateral 2011  . COLONOSCOPY    . JOINT REPLACEMENT    . SHOULDER ARTHROSCOPY DISTAL CLAVICLE EXCISION AND OPEN ROTATOR CUFF REPAIR Right 11/2013  . TOENAIL EXCISION Right 04/2018   "big toe"  .  TONSILLECTOMY  1950's  . TOTAL KNEE ARTHROPLASTY Left 10/13/2014   Procedure: LEFT TOTAL KNEE ARTHROPLASTY;  Surgeon: Mcarthur Rossetti, MD;  Location: [facility];  Service: Orthopedics;  Laterality: Left;  . TOTAL KNEE ARTHROPLASTY Right 12/10/2018   Procedure: RIGHT TOTAL KNEE ARTHROPLASTY;  Surgeon: Mcarthur Rossetti, MD;  Location: [facility];  Service: Orthopedics;  Laterality: Right;    Current Medications: Current Meds  Medication Sig  . calcium carbonate (TUMS - DOSED IN MG ELEMENTAL CALCIUM) 500 MG chewable tablet Chew 2 tablets by mouth daily as needed for indigestion or heartburn.  .  Calcium Carbonate-Vitamin D (CALCIUM 600 + D PO) Take 1 tablet by mouth daily.  Marland Kitchen CARTIA XT 180 MG 24 hr capsule Take 1 capsule by mouth once daily  . CINNAMON PO Take 1 tablet by mouth daily.  . citalopram (CELEXA) 40 MG tablet TAKE 1 TABLET BY MOUTH EVERY DAY  . clonazePAM (KLONOPIN) 0.5 MG tablet TAKE 1/2 (ONE-HALF) TABLET BY MOUTH AT BEDTIME AS NEEDED  . Eszopiclone 3 MG TABS Take 1 tablet (3 mg total) by mouth at bedtime as needed. Take immediately before bedtime  . glipiZIDE (GLUCOTROL XL) 5 MG 24 hr tablet Take 1 tablet (5 mg total) by mouth daily with breakfast. TAKE 1 TABLET BY MOUTH ONCE DAILY WITH BREAKFAST  . magnesium oxide (MAG-OX) 400 (241.3 Mg) MG tablet Take 400 mg by mouth daily.  . metFORMIN (GLUCOPHAGE) 1000 MG tablet Take 1 tablet (1,000 mg total) by mouth 2 (two) times daily with a meal.  . metoprolol tartrate (LOPRESSOR) 25 MG tablet TAKE HALF TABLET BY MOUTH TWICE DAILY  . Omega-3 Fatty Acids (OMEGA-3 FISH OIL) 1200 MG CAPS Take 2,400 mg by mouth 2 (two) times daily.  Marland Kitchen omeprazole (PRILOSEC) 40 MG capsule Take 1 capsule (40 mg total) by mouth every morning.  . rosuvastatin (CRESTOR) 20 MG tablet Take 1 tablet (20 mg total) by mouth daily.  Marland Kitchen spironolactone (ALDACTONE) 25 MG tablet Take 0.5 tablets (12.5 mg total) by mouth daily.  Marland Kitchen tiZANidine (ZANAFLEX) 2 MG tablet Take 1-2 tablets (2-4 mg total) by mouth every 6 (six) hours as needed for muscle spasms.  Marland Kitchen warfarin (COUMADIN) 5 MG tablet TAKE 1 TO 1 & 1/2 (ONE TO ONE & ONE-HALF) TABLETS BY MOUTH ONCE DAILY AS DIRECTED BY  COUMADIN  CLINIC     Allergies:   Antivert [meclizine hcl]   Social History   Socioeconomic History  . Marital status: Married    Spouse name: Not on file  . Number of children: Not on file  . Years of education: Not on file  . Highest education level: Not on file  Occupational History  . Not on file  Tobacco Use  . Smoking status: Never Smoker  . Smokeless tobacco: Never Used  Substance and  Sexual Activity  . Alcohol use: Yes    Comment: 10/14/2014 "might have a drink when I'm out once/month"  . Drug use: No  . Sexual activity: Not Currently  Other Topics Concern  . 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 Difficulty of Paying Living Expenses:   Food Insecurity:   . Worried About Charity fundraiser in the Last Year:   . Arboriculturist in the Last Year:   Transportation Needs:   .  Lack of Transportation (Medical):   Marland Kitchen Lack of Transportation (Non-Medical):   Physical Activity:   . Days of Exercise per Week:   . Minutes of Exercise per Session:   Stress:   . Feeling of Stress :   Social Connections:   . Frequency of Communication with Friends and Family:   . Frequency of Social Gatherings with Friends and Family:   . Attends Religious Services:   . Active Member of Clubs or Organizations:   . Attends Archivist Meetings:   Marland Kitchen Marital Status:      Family History: The patient's family history includes Arthritis in her father, maternal grandfather, maternal grandmother, mother, paternal grandfather, and paternal grandmother; Diabetes in her brother, father, maternal grandmother, mother, paternal grandfather, and paternal grandmother; Heart disease in her father; Hyperlipidemia in her father, maternal grandfather, maternal grandmother, mother, paternal grandfather, and paternal grandmother; Hypertension in her brother, maternal grandfather, maternal grandmother, mother, paternal grandfather, and paternal grandmother; Stroke in her brother.  ROS:   Please see the history of present illness.    DJD C-spine and L-spine- followed by Dr Rita Ohara  All other systems reviewed and are negative.  EKGs/Labs/Other Studies Reviewed:    The following studies were reviewed today: Echo Aug 2019 Stress Aug 2019  EKG:  EKG is not ordered today.  The ekg  ordered today demonstrates NSR, SB, LVH  Recent Labs: 03/04/2019: TSH 2.14 11/26/2019: ALT 17; BUN 13; Creat 0.59; Hemoglobin 13.0; Platelets 246; Potassium 4.7; Sodium 137  Recent Lipid Panel    Component Value Date/Time   CHOL 138 08/07/2018 1530   TRIG 185 (H) 08/07/2018 1530   HDL 46 (L) 08/07/2018 1530   CHOLHDL 3.0 08/07/2018 1530   VLDL 22 05/03/2018 0153   LDLCALC 66 08/07/2018 1530   LDLDIRECT 42.0 01/31/2018 0943    Physical Exam:    VS:  BP 132/68   Pulse 65   Ht 5\' 4"  (1.626 m)   Wt 242 lb 6.4 oz (110 kg)   SpO2 97%   BMI 41.61 kg/m     Wt Readings from Last 3 Encounters:  12/26/19 242 lb 6.4 oz (110 kg)  12/11/19 240 lb 9.6 oz (109.1 kg)  09/09/19 226 lb 12.8 oz (102.9 kg)     GEN: Obese caucasian female, well developed in no acute distress HEENT: Normal NECK: No JVD; No carotid bruits CARDIAC: RRR, no murmurs, rubs, gallops RESPIRATORY:  Clear to auscultation without rales, wheezing or rhonchi  ABDOMEN: Soft, non-tender, non-distended MUSCULOSKELETAL:  No edema; No deformity  SKIN: Warm and dry NEUROLOGIC:  Alert and oriented x 3 PSYCHIATRIC:  Normal affect   ASSESSMENT:    Dizziness- This sounds like inner ear dizziness. She had issues with Meclizine in the remote past but is willing tot try this again. I'll prescribe low dose-12.5 mg TID.   PAF (paroxysmal atrial fibrillation) (HCC) converted with rate control8/1/19  Chronic anticoagulation CHADS VASC=3  Hypertensive cardiovascular disease Normal LVF with grade 2 DD and mild to moderate LAE B/P controlled on her current medications  OSA (obstructive sleep apnea) On C-pap  NIDDM-type 2 On Glucophage  Morbid obesity with BMI of 40.0-44.9, adult (H[facility])  PLAN:    Antivert 12.5 mg TID x 5 days.  Stop Mg++.  Check BMP and Mg++ in two weeks.  F/U with Dr Claiborne Billings as arranged.    Medication Adjustments/Labs and Tests Ordered: Current medicines are reviewed at length with the patient  today.  Concerns regarding medicines are outlined  above.  No orders of the defined types were placed in this encounter.  No orders of the defined types were placed in this encounter.   There are no Patient Instructions on file for this visit.   Signed, Kerin Ransom, PA-C  12/26/2019 4:15 PM    [facility]

## 2019-12-26 NOTE — Telephone Encounter (Signed)

## 2019-12-29 ENCOUNTER — Encounter: Payer: Self-pay | Admitting: Dietician

## 2019-12-29 ENCOUNTER — Encounter: Payer: Medicare Other | Attending: Family Medicine | Admitting: Dietician

## 2019-12-29 ENCOUNTER — Other Ambulatory Visit: Payer: Self-pay

## 2019-12-29 DIAGNOSIS — E119 Type 2 diabetes mellitus without complications: Secondary | ICD-10-CM

## 2019-12-29 NOTE — Patient Instructions (Addendum)
Consider getting your vitamin B-12 checked or take a sublingual vitamin B-12 due to a history of vitamin B-12 deficiency. Consider getting your vitamin D checked.  Consider "Sit and Be Fit" for exercise. Avoid added salt.  Limit processed foods. Consider ways to increase your vegetable intake at lunch and dinner (raw veges and humus or homemade vegetable soup for example)  Consider a small snack in the am to help avoid low blood sugar later or move lunch to an earlier time. Add a protein to breakfast.  Evaluate if you are having more low blood sugar after cereal.   Aim for 2-3 Carb Choices per meal (30-45 grams) +/- 1 either way  Aim for 0-1 Carbs per snack if hungry  Include protein in moderation with your meals and snacks Consider reading food labels for Total Carbohydrate of foods Consider  increasing your activity level by armchair for 15 minutes daily as tolerated Continue checking BG at alternate times per day  Continue taking medication as directed by MD

## 2019-12-29 NOTE — Progress Notes (Signed)
Diabetes Self-Management Education  Visit Type: First/Initial  Appt. Start Time: 1620 Appt. End Time: [id number]  12/29/2019  [name], identified by name and date of birth, is a 69 y.o. female with a diagnosis of Diabetes: Type 2.   ASSESSMENT Patient is here today alone.  She states that her A1C is 8% which is the highest it has been.  She also struggles with weight gain despite controlled intake. She has had 3 recent steroid injections in her back from November-January 2021. She has had 5 surgeries in the past 5 years. History includes Type 2 diabetes and OSA She notes some problems with her memory and some word finding issues today. Medications include glypizide, metformin, and coumadin  Patient lives with her husband.  They shop on line.  Her husband has spinal stenosis and is disabled.  She cooks.  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  Height 5\' 4"  (1.626 m), weight 243 lb (110.2 kg). Body mass index is 41.71 kg/m.  Diabetes Self-Management Education - 12/29/19 1639      Visit Information   Visit Type  First/Initial      Initial Visit   Diabetes Type  Type 2    Are you currently following a meal plan?  No    Are you taking your medications as prescribed?  Yes    Date Diagnosed  2007      Health Coping   How would you rate your overall health?  Good      Psychosocial Assessment   Patient Belief/Attitude about Diabetes  Motivated to manage diabetes    Self-care barriers  None;Debilitated state due to current medical condition    Self-management support  Doctor's office;Family    Other persons present  Patient    Patient Concerns  Nutrition/Meal planning;Glycemic Control    Special Needs  None    Preferred Learning Style  No preference indicated    Learning Readiness  Ready    How often do you need to have someone help you when you read instructions, pamphlets, or other written materials from your doctor or pharmacy?  1 - Never     What is the last grade level you completed in school?  4 years college      Pre-Education Assessment   Patient understands the diabetes disease and treatment process.  Needs Review    Patient understands incorporating nutritional management into lifestyle.  Needs Review    Patient undertands incorporating physical activity into lifestyle.  Needs Review    Patient understands using medications safely.  Needs Review    Patient understands monitoring blood glucose, interpreting and using results  Needs Review    Patient understands prevention, detection, and treatment of acute complications.  Needs Review    Patient understands prevention, detection, and treatment of chronic complications.  Needs Review    Patient understands how to develop strategies to address psychosocial issues.  Needs Review    Patient understands how to develop strategies to promote health/change behavior.  Needs Review      Complications   Last HgB A1C per patient/outside source  8 %   11/26/2019 increased from 7.1% 12/02/18   How often do you check your blood sugar?  1-2 times/day    Fasting Blood glucose range (mg/dL)  130-179   136-152   Number of hypoglycemic episodes per month  4    Can you tell when your blood sugar is low?  Yes  What do you do if your blood sugar is low?  glucose tabs    Have you had a dilated eye exam in the past 12 months?  No    Have you had a dental exam in the past 12 months?  Yes    Are you checking your feet?  Yes    How many days per week are you checking your feet?  7      Dietary Intake   Breakfast  rice chex, 2% milk OR english muffin, peanut butter OR bagel, peanut butter or cream cheese   9   Lunch  [place] or ham on rye, pretzels or occasional chips   2   Snack (afternoon)  occasional granola bar    Dinner  meat, vegetable, 1/2 baked potato or other starch OR salad and casserole   7   Snack (evening)  occasional ice cream    Beverage(s)  water, coffee with sweet and low,  liquid creamer, unsweetened tea, occasional diet gingerale      Exercise   Exercise Type  ADL's      Patient Education   Previous Diabetes Education  No    Nutrition management   Role of diet in the treatment of diabetes and the relationship between the three main macronutrients and blood glucose level;Food label reading, portion sizes and measuring food.;Meal options for control of blood glucose level and chronic complications.    Physical activity and exercise   Role of exercise on diabetes management, blood pressure control and cardiac health.;Helped patient identify appropriate exercises in relation to his/her diabetes, diabetes complications and other health issue.    Medications  Reviewed patients medication for diabetes, action, purpose, timing of dose and side effects.    Monitoring  Identified appropriate SMBG and/or A1C goals.;Taught/discussed recording of test results and interpretation of SMBG.    Acute complications  Taught treatment of hypoglycemia - the 15 rule.    Chronic complications  Relationship between chronic complications and blood glucose control      Individualized Goals (developed by patient)   Nutrition  General guidelines for healthy choices and portions discussed    Physical Activity  Exercise 3-5 times per week;15 minutes per day    Medications  take my medication as prescribed    Monitoring   test my blood glucose as discussed    Problem Solving  --   increased vegetables   Reducing Risk  Other (comment)    Health Coping  discuss diabetes with (comment)   MD, RD, CDE     Post-Education Assessment   Patient understands the diabetes disease and treatment process.  Demonstrates understanding / competency    Patient understands incorporating nutritional management into lifestyle.  Demonstrates understanding / competency    Patient undertands incorporating physical activity into lifestyle.  Demonstrates understanding / competency    Patient understands using  medications safely.  Demonstrates understanding / competency    Patient understands monitoring blood glucose, interpreting and using results  Demonstrates understanding / competency    Patient understands prevention, detection, and treatment of acute complications.  Demonstrates understanding / competency    Patient understands prevention, detection, and treatment of chronic complications.  Demonstrates understanding / competency    Patient understands how to develop strategies to address psychosocial issues.  Demonstrates understanding / competency    Patient understands how to develop strategies to promote health/change behavior.  Demonstrates understanding / competency      Outcomes   Expected Outcomes  Demonstrated interest in  learning. Expect positive outcomes    Future DMSE  PRN    Program Status  Completed       Individualized Plan for Diabetes Self-Management Training:   Learning Objective:  Patient will have a greater understanding of diabetes self-management. Patient education plan is to attend individual and/or group sessions per assessed needs and concerns.   Plan:   Patient Instructions  Consider getting your vitamin B-12 checked or take a sublingual vitamin B-12 due to a history of vitamin B-12 deficiency. Consider getting your vitamin D checked.  Consider "Sit and Be Fit" for exercise. Avoid added salt.  Limit processed foods. Consider ways to increase your vegetable intake at lunch and dinner (raw veges and humus or homemade vegetable soup for example)  Consider a small snack in the am to help avoid low blood sugar later or move lunch to an earlier time. Add a protein to breakfast.  Evaluate if you are having more low blood sugar after cereal.   Aim for 2-3 Carb Choices per meal (30-45 grams) +/- 1 either way  Aim for 0-1 Carbs per snack if hungry  Include protein in moderation with your meals and snacks Consider reading food labels for Total Carbohydrate of  foods Consider  increasing your activity level by armchair for 15 minutes daily as tolerated Continue checking BG at alternate times per day  Continue taking medication as directed by MD      Expected Outcomes:  Demonstrated interest in learning. Expect positive outcomes  Education material provided: ADA - How to Thrive: A Guide for Your Journey with Diabetes, Meal plan card and Snack sheet  If problems or questions, patient to contact team via:  Phone  Future DSME appointment: PRN

## 2020-01-01 ENCOUNTER — Other Ambulatory Visit: Payer: Self-pay | Admitting: Cardiovascular Disease

## 2020-01-05 ENCOUNTER — Other Ambulatory Visit (INDEPENDENT_AMBULATORY_CARE_PROVIDER_SITE_OTHER): Payer: Self-pay | Admitting: Family Medicine

## 2020-01-05 MED ORDER — CLONAZEPAM 0.5 MG PO TABS: ORAL_TABLET | ORAL | 0 refills | Status: DC

## 2020-01-05 NOTE — Telephone Encounter (Signed)
Ok to rf? 

## 2020-01-06 ENCOUNTER — Encounter (INDEPENDENT_AMBULATORY_CARE_PROVIDER_SITE_OTHER): Payer: Self-pay | Admitting: Family Medicine

## 2020-01-06 MED ORDER — CLONAZEPAM 0.5 MG PO TABS: ORAL_TABLET | ORAL | 0 refills | Status: DC

## 2020-01-07 ENCOUNTER — Other Ambulatory Visit: Payer: Self-pay

## 2020-01-07 MED ORDER — METOPROLOL TARTRATE 25 MG PO TABS: ORAL_TABLET | ORAL | 5 refills | Status: DC

## 2020-01-15 ENCOUNTER — Encounter (INDEPENDENT_AMBULATORY_CARE_PROVIDER_SITE_OTHER): Payer: Self-pay | Admitting: Family Medicine

## 2020-01-15 DIAGNOSIS — Z7901 Long term (current) use of anticoagulants: Secondary | ICD-10-CM | POA: Diagnosis not present

## 2020-01-15 DIAGNOSIS — E119 Type 2 diabetes mellitus without complications: Secondary | ICD-10-CM | POA: Diagnosis not present

## 2020-01-15 DIAGNOSIS — I480 Paroxysmal atrial fibrillation: Secondary | ICD-10-CM | POA: Diagnosis not present

## 2020-01-15 DIAGNOSIS — I119 Hypertensive heart disease without heart failure: Secondary | ICD-10-CM | POA: Diagnosis not present

## 2020-01-15 DIAGNOSIS — G4733 Obstructive sleep apnea (adult) (pediatric): Secondary | ICD-10-CM | POA: Diagnosis not present

## 2020-01-16 LAB — BASIC METABOLIC PANEL
BUN/Creatinine Ratio: 15 (ref 12–28)
BUN: 8 mg/dL (ref 8–27)
CO2: 26 mmol/L (ref 20–29)
Calcium: 9 mg/dL (ref 8.7–10.3)
Chloride: 102 mmol/L (ref 96–106)
Creatinine, Ser: 0.53 mg/dL — ABNORMAL LOW (ref 0.57–1.00)
GFR calc Af Amer: 110 mL/min/{1.73_m2} (ref >59)
GFR calc non Af Amer: 96 mL/min/{1.73_m2} (ref >59)
Glucose: 215 mg/dL — ABNORMAL HIGH (ref 65–99)
Potassium: 4.7 mmol/L (ref 3.5–5.2)
Sodium: 141 mmol/L (ref 134–144)

## 2020-01-16 LAB — MAGNESIUM: Magnesium: 1.3 mg/dL — ABNORMAL LOW (ref 1.6–2.3)

## 2020-01-16 MED ORDER — ESZOPICLONE 3 MG PO TABS: 3.0000 mg | ORAL_TABLET | Freq: Every evening | ORAL | 3 refills | Status: DC | PRN

## 2020-01-22 ENCOUNTER — Other Ambulatory Visit: Payer: Self-pay

## 2020-01-22 ENCOUNTER — Ambulatory Visit (INDEPENDENT_AMBULATORY_CARE_PROVIDER_SITE_OTHER): Payer: Medicare Other | Admitting: Pharmacist Clinician (PhC)/ Clinical Pharmacy Specialist

## 2020-01-22 DIAGNOSIS — I4891 Unspecified atrial fibrillation: Secondary | ICD-10-CM | POA: Diagnosis not present

## 2020-01-22 DIAGNOSIS — Z7901 Long term (current) use of anticoagulants: Secondary | ICD-10-CM | POA: Diagnosis not present

## 2020-01-22 LAB — POCT INR: INR: 2.8 (ref 2.0–3.0)

## 2020-01-23 ENCOUNTER — Telehealth: Payer: Self-pay

## 2020-01-23 ENCOUNTER — Telehealth: Payer: Self-pay | Admitting: Cardiovascular Disease

## 2020-01-23 NOTE — Telephone Encounter (Signed)
Pt calling back in, pt explained that they have not filed taxes in many years because unfortunately they do not make enough money and live off of [employer] income. Notified I would send in the information to [place] that I have and notify if we need anything else. Pt verbalized understanding with no other questions at this time.

## 2020-01-23 NOTE — Telephone Encounter (Signed)
  Went to chart to check who call pt, transferred call to [facility]

## 2020-01-23 NOTE — Telephone Encounter (Signed)
[id number]. Need to notify pt that we need a copy of her income taxes in order for her patient assistance to be approved.

## 2020-01-26 ENCOUNTER — Encounter (INDEPENDENT_AMBULATORY_CARE_PROVIDER_SITE_OTHER): Payer: Self-pay | Admitting: Family Medicine

## 2020-01-27 MED ORDER — SILVER SULFADIAZINE 1 % EX CREA: 1.0000 "application " | TOPICAL_CREAM | Freq: Every day | CUTANEOUS | 3 refills | Status: DC

## 2020-01-28 ENCOUNTER — Ambulatory Visit (INDEPENDENT_AMBULATORY_CARE_PROVIDER_SITE_OTHER): Payer: Medicare Other

## 2020-01-28 ENCOUNTER — Ambulatory Visit (INDEPENDENT_AMBULATORY_CARE_PROVIDER_SITE_OTHER): Payer: Medicare Other | Admitting: Physician Assistant

## 2020-01-28 ENCOUNTER — Encounter: Payer: Self-pay | Admitting: Physician Assistant

## 2020-01-28 ENCOUNTER — Other Ambulatory Visit: Payer: Self-pay

## 2020-01-28 DIAGNOSIS — M7541 Impingement syndrome of right shoulder: Secondary | ICD-10-CM

## 2020-01-28 DIAGNOSIS — M25521 Pain in right elbow: Secondary | ICD-10-CM

## 2020-01-28 MED ORDER — METHYLPREDNISOLONE ACETATE 40 MG/ML IJ SUSP
40.0000 mg | INTRAMUSCULAR | Status: AC | PRN
  Administered 2020-01-28: 40 mg via INTRA_ARTICULAR

## 2020-01-28 MED ORDER — LIDOCAINE HCL 1 % IJ SOLN
3.0000 mL | INTRAMUSCULAR | Status: AC | PRN
  Administered 2020-01-28: 3 mL

## 2020-01-28 NOTE — Progress Notes (Signed)
Office Visit Note   Patient: Debra Wright           Date of Birth: 1950/10/08           MRN: [id number] Visit Date: 01/28/2020              Requested by: Eunice Blase, MD [address] [place],  [place] [address] PCP: Eunice Blase, MD   Assessment & Plan: Visit Diagnoses:   1. Pain in right elbow   2. Impingement syndrome of right shoulder     Plan: We will send her to therapy for range of motion strengthening of her right shoulder.  Also for range of motion of the right elbow particularly supination.  We will see her back in 4 weeks to check her response to the injection of the shoulder and the therapy on both her right shoulder and elbow.  Questions were encouraged and answered at length  Follow-Up Instructions: Return in about 4 weeks (around 02/25/2020).   Orders:  Orders Placed This Encounter  Procedures  . Large Joint Inj  . XR Elbow 2 Views Right   No orders of the defined types were placed in this encounter.     Procedures: Large Joint Inj: R subacromial bursa on 01/28/2020 5:59 PM Indications: pain Details: 22 G 1.5 in needle, superior approach  Arthrogram: No  Medications: 3 mL lidocaine 1 %; 40 mg methylPREDNISolone acetate 40 MG/ML Outcome: tolerated well, no immediate complications Procedure, treatment alternatives, risks and benefits explained, specific risks discussed. Consent was given by the patient. Immediately prior to procedure a time out was called to verify the correct patient, procedure, equipment, support staff and site/side marked as required. Patient was prepped and draped in the usual sterile fashion.       Clinical Data: No additional findings.   Subjective: Chief Complaint  Patient presents with  . Right Shoulder - Pain    HPI [name] comes in today for right shoulder pain requesting injection.  She last had injection in shoulder in January 2020.  She reports good control of her diabetes.  She is had a Covid injection  sometime in February is the second injection.  She is having difficulty with her activities of daily living due to the right shoulder pain particularly overhead activities.  Also reaching behind her.  No known injury. She also notes that she is having difficulty turning her forearm.  She states this is been ongoing for some time she is not sure how long.  No known injury to the arm.  She does have A. fib and is on chronic anticoagulation. Review of Systems Please see HPI otherwise negative  Objective: Vital Signs: There were no vitals taken for this visit.  Physical Exam Constitutional:      Appearance: She is not ill-appearing or diaphoretic.  Pulmonary:     Effort: Pulmonary effort is normal.  Neurological:     Mental Status: She is alert and oriented to person, place, and time.  Psychiatric:        Mood and Affect: Mood normal.     Ortho Exam Right shoulder 5 out of 5 strength external/internal rotation against resistance empty can test is negative bilaterally.  Unable to perform liftoff test on the right.  Positive impingement sign on the right.  Passively I can bring her right arm overhead to the proximal 160 degrees postinjection type of 80 degrees. Right forearm she has full pronation supination approximately 50% of normal range of motion actively  passively I can bring her to almost full supination this is painful.  She has some slight tenderness over the radial head region with palpation.  Distal biceps tendon is intact.  She has areas of ecchymosis up and down both arms due to her chronic anticoagulation. Specialty Comments:  No specialty comments available.  Imaging: XR Elbow 2 Views Right  Result Date: 01/28/2020 Right elbow 2 views shows no acute findings.  Some minimal arthritic changes about the elbow.  Elbow is well located.  No bony abnormalities otherwise.    PMFS History: Patient Active Problem List   Diagnosis Date Noted  . Unilateral primary osteoarthritis,  right knee 12/10/2018  . Status post total right knee replacement 12/10/2018  . Long term (current) use of anticoagulants 08/16/2018  . Hypertensive cardiovascular disease 06/04/2018  . Morbid obesity with BMI of 40.0-44.9, adult ([place]) 06/04/2018  . Fatigue 06/04/2018  . Chronic anticoagulation   . PAF (paroxysmal atrial fibrillation) ([place]) 05/02/2018  . Primary osteoarthritis of right knee 06/14/2017  . OSA (obstructive sleep apnea) 02/13/2017  . NIDDM-type 2 10/31/2016  . Essential hypertension 08/02/2016  . Anxiety and depression 08/02/2016  . Insomnia 08/02/2016  . Arthritis of left knee 10/13/2014  . Status post total left knee replacement 10/13/2014   Past Medical History:  Diagnosis Date  . Anxiety   . Arthritis    "knees; right thumb" (10/14/2014)  . Basal cell carcinoma    "burned off upper lip & right shoulder"  . Chronic anticoagulation    CHADS VASC=3  . Complication of anesthesia    after spinal anesthesia for left knee replacement, bladder did "not wake up". Was incontinent for 4 days post surgery  . Depression   . Fibroid tumor   . Frequent diarrhea   . GERD (gastroesophageal reflux disease)   . High cholesterol   . Hypertension    EF 65-70% grade 2DD  . Insomnia   . Migraine    "frequently when I was younger; maybe monthly now" (10/14/2014) - none after hysterectomy  . PAF (paroxysmal atrial fibrillation) ([place]) 05/02/2018   converted with rate control  . Pernicious anemia    pt not aware of this  . Sleep apnea    uses a cpap  . Type II diabetes mellitus ([place])   . Vitamin B 12 deficiency     Family History  Problem Relation Age of Onset  . Arthritis Mother   . Hyperlipidemia Mother   . Diabetes Mother   . Hypertension Mother   . Arthritis Father   . Hyperlipidemia Father   . Heart disease Father   . Diabetes Father   . Stroke Brother   . Hypertension Brother   . Diabetes Brother   . Arthritis Maternal Grandmother   . Hyperlipidemia Maternal  Grandmother   . Diabetes Maternal Grandmother   . Hypertension Maternal Grandmother   . Arthritis Maternal Grandfather   . Hyperlipidemia Maternal Grandfather   . Hypertension Maternal Grandfather   . Arthritis Paternal Grandmother   . Hyperlipidemia Paternal Grandmother   . Diabetes Paternal Grandmother   . Hypertension Paternal Grandmother   . Arthritis Paternal Grandfather   . Hyperlipidemia Paternal Grandfather   . Diabetes Paternal Grandfather   . Hypertension Paternal Grandfather     Past Surgical History:  Procedure Laterality Date  . ABDOMINAL HYSTERECTOMY  1988  . ACHILLES TENDON SURGERY Right   . APPENDECTOMY  1988  . BILATERAL OOPHORECTOMY  2004  . CARPAL TUNNEL RELEASE Bilateral 1986  .  CATARACT EXTRACTION W/ INTRAOCULAR LENS  IMPLANT, BILATERAL Bilateral 2011  . COLONOSCOPY    . JOINT REPLACEMENT    . SHOULDER ARTHROSCOPY DISTAL CLAVICLE EXCISION AND OPEN ROTATOR CUFF REPAIR Right 11/2013  . TOENAIL EXCISION Right 04/2018   "big toe"  . TONSILLECTOMY  1950's  . TOTAL KNEE ARTHROPLASTY Left 10/13/2014   Procedure: LEFT TOTAL KNEE ARTHROPLASTY;  Surgeon: Mcarthur Rossetti, MD;  Location: [place] [facility];  Service: Orthopedics;  Laterality: Left;  . TOTAL KNEE ARTHROPLASTY Right 12/10/2018   Procedure: RIGHT TOTAL KNEE ARTHROPLASTY;  Surgeon: Mcarthur Rossetti, MD;  Location: [place];  Service: Orthopedics;  Laterality: Right;   Social History   Occupational History  . Not on file  Tobacco Use  . Smoking status: Never Smoker  . Smokeless tobacco: Never Used  Substance and Sexual Activity  . Alcohol use: Yes    Comment: 10/14/2014 "might have a drink when I'm out once/month"  . Drug use: No  . Sexual activity: Not Currently

## 2020-01-29 ENCOUNTER — Other Ambulatory Visit: Payer: Self-pay | Admitting: Radiology

## 2020-01-29 DIAGNOSIS — M7541 Impingement syndrome of right shoulder: Secondary | ICD-10-CM

## 2020-01-29 DIAGNOSIS — M25521 Pain in right elbow: Secondary | ICD-10-CM

## 2020-02-02 ENCOUNTER — Encounter (INDEPENDENT_AMBULATORY_CARE_PROVIDER_SITE_OTHER): Payer: Self-pay | Admitting: Family Medicine

## 2020-02-03 ENCOUNTER — Other Ambulatory Visit: Payer: Self-pay

## 2020-02-03 ENCOUNTER — Encounter (INDEPENDENT_AMBULATORY_CARE_PROVIDER_SITE_OTHER): Payer: Self-pay | Admitting: Family Medicine

## 2020-02-03 ENCOUNTER — Encounter: Payer: Self-pay | Admitting: Physical Therapy

## 2020-02-03 ENCOUNTER — Ambulatory Visit: Payer: Medicare Other | Attending: Physician Assistant | Admitting: Physical Therapy

## 2020-02-03 DIAGNOSIS — M25611 Stiffness of right shoulder, not elsewhere classified: Secondary | ICD-10-CM | POA: Diagnosis not present

## 2020-02-03 DIAGNOSIS — M25511 Pain in right shoulder: Secondary | ICD-10-CM | POA: Diagnosis not present

## 2020-02-03 DIAGNOSIS — R29898 Other symptoms and signs involving the musculoskeletal system: Secondary | ICD-10-CM | POA: Insufficient documentation

## 2020-02-03 DIAGNOSIS — M25621 Stiffness of right elbow, not elsewhere classified: Secondary | ICD-10-CM | POA: Insufficient documentation

## 2020-02-03 DIAGNOSIS — G8929 Other chronic pain: Secondary | ICD-10-CM | POA: Insufficient documentation

## 2020-02-03 MED ORDER — CLONAZEPAM 0.5 MG PO TABS: ORAL_TABLET | ORAL | 0 refills | Status: DC

## 2020-02-03 MED ORDER — NYSTATIN 100000 UNIT/GM EX CREA: 1.0000 "application " | TOPICAL_CREAM | Freq: Two times a day (BID) | CUTANEOUS | 3 refills | Status: DC

## 2020-02-03 NOTE — Therapy (Signed)
[facility] [address]  [place], [place], [address] Phone: [phone]   Fax:  [phone]  Physical Therapy Evaluation  Patient Details  Name: Debra Wright MRN: [id number] Date of Birth: [birth year] Referring Provider (PT): Erskine Emery, PA-C   Encounter Date: 02/03/2020  PT End of Session - 02/03/20 1827    Visit Number  1    Number of Visits  13    Date for PT Re-Evaluation  03/16/20    Authorization Type  Medicare & AARP    PT Start Time  [id number]    PT Stop Time  1528    PT Time Calculation (min)  41 min    Activity Tolerance  Patient tolerated treatment well;Patient limited by pain    Behavior During Therapy  [facility] for tasks assessed/performed       Past Medical History:  Diagnosis Date  . Anxiety   . Arthritis    "knees; right thumb" (10/14/2014)  . Basal cell carcinoma    "burned off upper lip & right shoulder"  . Chronic anticoagulation    CHADS VASC=3  . Complication of anesthesia    after spinal anesthesia for left knee replacement, bladder did "not wake up". Was incontinent for 4 days post surgery  . Depression   . Fibroid tumor   . Frequent diarrhea   . GERD (gastroesophageal reflux disease)   . High cholesterol   . Hypertension    EF 65-70% grade 2DD  . Insomnia   . Migraine    "frequently when I was younger; maybe monthly now" (10/14/2014) - none after hysterectomy  . PAF (paroxysmal atrial fibrillation) ([place]) 05/02/2018   converted with rate control  . Pernicious anemia    pt not aware of this  . Sleep apnea    uses a cpap  . Type II diabetes mellitus ([place])   . Vitamin B 12 deficiency     Past Surgical History:  Procedure Laterality Date  . ABDOMINAL HYSTERECTOMY  1988  . ACHILLES TENDON SURGERY Right   . APPENDECTOMY  1988  . BILATERAL OOPHORECTOMY  2004  . CARPAL TUNNEL RELEASE Bilateral 1986  . CATARACT EXTRACTION W/ INTRAOCULAR LENS  IMPLANT, BILATERAL Bilateral 2011  .  COLONOSCOPY    . JOINT REPLACEMENT    . SHOULDER ARTHROSCOPY DISTAL CLAVICLE EXCISION AND OPEN ROTATOR CUFF REPAIR Right 11/2013  . TOENAIL EXCISION Right 04/2018   "big toe"  . TONSILLECTOMY  1950's  . TOTAL KNEE ARTHROPLASTY Left 10/13/2014   Procedure: LEFT TOTAL KNEE ARTHROPLASTY;  Surgeon: Mcarthur Rossetti, MD;  Location: [facility];  Service: Orthopedics;  Laterality: Left;  . TOTAL KNEE ARTHROPLASTY Right 12/10/2018   Procedure: RIGHT TOTAL KNEE ARTHROPLASTY;  Surgeon: Mcarthur Rossetti, MD;  Location: [facility];  Service: Orthopedics;  Laterality: Right;    There were no vitals filed for this visit.   Subjective Assessment - 02/03/20 1450    Subjective  Patient reports that she fell 6 months ago after tripping over a rug, falling on her R side. Now has difficulty supinating her R elbow. Received a R shoulder injection which helped some with her shoulder pain. Having trouble blow drying hair. Pain is located over the R superior shoulder with radiation down the lateral arm stooping before elbow. Denies elbow pain. When supinating her arm, pain is located over the R anterior shoulder. Also has pain with R sidelying.    Pertinent History  DMII, anemia, a-fib, migraines, HLT,  HLD, GERD, depression, anxiety, R achilles tendon surgery, B carpal tunnel release, R RTC repair 2015, B TKA    Limitations  House hold activities;Lifting    Diagnostic tests  01/28/20 R elbow: no acute findings.  Some minimal arthritic changes about the elbow.  Elbow is well located.  No bony abnormalities otherwise.    Patient Stated Goals  "have better use of her shoulder and arm"    Currently in Pain?  No/denies    Pain Location  Shoulder    Pain Orientation  Right    Pain Descriptors / Indicators  Aching;Sharp    Pain Type  Chronic pain    Pain Radiating Towards  down the lateral arm         [facility] PT Assessment - 02/03/20 1456      Assessment   Medical Diagnosis  Impingement of R shoulder, pain in R  elbow    Referring Provider (PT)  Erskine Emery, PA-C    Onset Date/Surgical Date  08/06/19    Next MD Visit  02/25/20    Prior Therapy  yes- B TKA      Precautions   Precautions  None      Balance Screen   Has the patient fallen in the past 6 months  Yes    How many times?  1   fall causing current injury   Has the patient had a decrease in activity level because of a fear of falling?   No    Is the patient reluctant to leave their home because of a fear of falling?   No      Home Film/video editor residence    Living Arrangements  Spouse/significant other   caregiver for disabled husband   Available Help at Discharge  Family    Type of [place]      Prior Function   Level of [place]  Retired      Associate Professor   Overall Cognitive Status  Within Functional Limits for tasks assessed      Observation/Other Assessments   Observations  B forearms covered in cuts and bruises- patient reports that she is on blood thinners      Sensation   Light Touch  Appears Intact      Coordination   Gross Motor Movements are Fluid and Coordinated  Yes      Posture/Postural Control   Posture/Postural Control  Postural limitations    Postural Limitations  Rounded Shoulders;Forward head      AROM   AROM Assessment Site  Elbow;Forearm    Right/Left Shoulder  Right;Left    Right Shoulder Flexion  155 Degrees    Right Shoulder ABduction  154 Degrees    Right Shoulder Internal Rotation  --   FIR T10 with mild pain   Right Shoulder External Rotation  --   FER T2 with mild-mod pain and extreme difficulty   Left Shoulder Flexion  145 Degrees    Left Shoulder ABduction  164 Degrees    Left Shoulder Internal Rotation  --   FIR T9   Left Shoulder External Rotation  --   FER T2   Right/Left Elbow  Right;Left    Right Elbow Flexion  121   unable to perform with elbow supinated   Right Elbow Extension  5    Left Elbow Flexion  131     Left Elbow Extension  -1    Right/Left  Forearm  Right;Left    Right Forearm Pronation  75 Degrees    Right Forearm Supination  45 Degrees    Left Forearm Pronation  80 Degrees    Left Forearm Supination  72 Degrees      Strength   Strength Assessment Site  Forearm;Elbow    Right/Left Shoulder  Right;Left    Right Shoulder Flexion  4/5    Right Shoulder ABduction  4/5    Right Shoulder Internal Rotation  4-/5   difficulty getting into test position   Right Shoulder External Rotation  4-/5   difficulty getting into test position   Left Shoulder Flexion  4+/5    Left Shoulder ABduction  4+/5    Left Shoulder Internal Rotation  4+/5    Left Shoulder External Rotation  4+/5    Right/Left Elbow  Left;Right    Right Elbow Flexion  4+/5    Right Elbow Extension  4+/5    Left Elbow Flexion  4+/5    Left Elbow Extension  4+/5    Right/Left Forearm  Right;Left    Right Forearm Pronation  4+/5    Right Forearm Supination  4+/5    Left Forearm Pronation  4+/5    Left Forearm Supination  4+/5      Palpation   Palpation comment  multiple painful trigger points evident over R infraspinatus, posterior and lateral deltoid, biceps muscle belly, pec; no TTP in R elbow or forearm                Objective measurements completed on examination: See above findings.              PT Education - 02/03/20 1827    Education provided  Yes    Education Details  prognosis, POC, HEP    Person(s) Educated  Patient    Methods  Explanation;Demonstration;Tactile cues;Verbal cues;Handout    Comprehension  Verbalized understanding;Returned demonstration       PT Short Term Goals - 02/03/20 1832      PT SHORT TERM GOAL #1   Title  Independent with initial HEP     Time  3    Period  Weeks    Status  New    Target Date  02/24/20        PT Long Term Goals - 02/03/20 1832      PT LONG TERM GOAL #1   Title  Independent with advanced HEP    Time  6    Period  Weeks    Status   New    Target Date  03/16/20      PT LONG TERM GOAL #2   Title  Patient to demonstrate R shoulder and elbow AROM WFL and without pain limiting.    Time  6    Period  Weeks    Status  New    Target Date  03/16/20      PT LONG TERM GOAL #3   Title  Patient to demonstrate R shoulder strength >/=4+/5.    Time  6    Period  Weeks    Status  New    Target Date  03/16/20      PT LONG TERM GOAL #4   Title  Patient to demonstrate overhead lift with 5lbs without pain and with good scapular mechanics.    Time  6    Period  Weeks    Status  New    Target Date  03/16/20  PT LONG TERM GOAL #5   Title  Patient to report 75% improvement in ability to blow dry her hair.    Time  6    Period  Weeks    Status  New    Target Date  03/16/20             Plan - 02/03/20 1828    Clinical Impression Statement  Patient is a 69y/o F presenting to OPPT with c/o R shoulder pain and elbow stiffness after experiencing a fall 6 months ago. Should pain is located over R superior and anterior shoulder, worse with trying to blow dry her hair, supinating her forearm, and R sidelying. Denies pain in the elbow. Patient today presenting with rounded shoulders and forward head posture, limited and painful R shoulder AROM- worse with ER, limited R elbow ROM, R shoulder weakness, and multiple painful trigger points over R infraspinatus, posterior and lateral deltoid, biceps muscle belly, pec. Patient educated on gentle AAROM HEP- patient reported understanding. Would benefit from skilled PT services 2x/week for 6 weeks to address aforementioned impairments.    Personal Factors and Comorbidities  Age;Comorbidity 3+;Fitness;Past/Current Experience;Time since onset of injury/illness/exacerbation    Comorbidities  DMII, anemia, a-fib, migraines, HLT, HLD, GERD, depression, anxiety, R achilles tendon surgery, B carpal tunnel release, R RTC repair 2015, B TKA    Examination-Activity Limitations  Bathing;Bed  Mobility;Sleep;Carry;Dressing;Hygiene/Grooming;Lift;Reach Overhead;Self Feeding    Examination-Participation Restrictions  Cleaning;Shop;Driving;Laundry;Meal Prep    Stability/Clinical Decision Making  Stable/Uncomplicated    Clinical Decision Making  Low    Rehab Potential  Good    PT Frequency  2x / week    PT Duration  6 weeks    PT Treatment/Interventions  ADLs/Self Care Home Management;Cryotherapy;Electrical Stimulation;Iontophoresis 4mg /ml Dexamethasone;Moist Heat;Therapeutic exercise;Balance training;Therapeutic activities;Functional mobility training;Ultrasound;Neuromuscular re-education;Patient/family education;Manual techniques;Vasopneumatic Device;Taping;Energy conservation;Dry needling;Passive range of motion    PT Next Visit Plan  reassess HEP; progress R shoulder ROM and elbow supination    Consulted and Agree with Plan of Care  Patient       Patient will benefit from skilled therapeutic intervention in order to improve the following deficits and impairments:  Hypomobility, Increased edema, Decreased activity tolerance, Decreased strength, Impaired UE functional use, Pain, Increased fascial restricitons, Increased muscle spasms, Decreased range of motion, Improper body mechanics, Postural dysfunction, Impaired flexibility  Visit Diagnosis: Chronic right shoulder pain  Stiffness of right shoulder, not elsewhere classified  Stiffness of right elbow, not elsewhere classified  Other symptoms and signs involving the musculoskeletal system     Problem List Patient Active Problem List   Diagnosis Date Noted  . Unilateral primary osteoarthritis, right knee 12/10/2018  . Status post total right knee replacement 12/10/2018  . Long term (current) use of anticoagulants 08/16/2018  . Hypertensive cardiovascular disease 06/04/2018  . Morbid obesity with BMI of 40.0-44.9, adult ([place]) 06/04/2018  . Fatigue 06/04/2018  . Chronic anticoagulation   . PAF (paroxysmal atrial  fibrillation) ([place]) 05/02/2018  . Primary osteoarthritis of right knee 06/14/2017  . OSA (obstructive sleep apnea) 02/13/2017  . NIDDM-type 2 10/31/2016  . Essential hypertension 08/02/2016  . Anxiety and depression 08/02/2016  . Insomnia 08/02/2016  . Arthritis of left knee 10/13/2014  . Status post total left knee replacement 10/13/2014     Janene Harvey, PT, DPT 02/03/20 6:35 PM   [facility] [address]  [place], [place], [address] Phone: [phone]   Fax:  [phone]  Name: Debra Wright  MRN: [id number] Date of Birth: 1950-11-18

## 2020-02-10 ENCOUNTER — Other Ambulatory Visit: Payer: Self-pay

## 2020-02-10 ENCOUNTER — Ambulatory Visit: Payer: Medicare Other

## 2020-02-10 DIAGNOSIS — M25621 Stiffness of right elbow, not elsewhere classified: Secondary | ICD-10-CM

## 2020-02-10 DIAGNOSIS — M25611 Stiffness of right shoulder, not elsewhere classified: Secondary | ICD-10-CM

## 2020-02-10 DIAGNOSIS — R29898 Other symptoms and signs involving the musculoskeletal system: Secondary | ICD-10-CM | POA: Diagnosis not present

## 2020-02-10 DIAGNOSIS — M25511 Pain in right shoulder: Secondary | ICD-10-CM

## 2020-02-10 DIAGNOSIS — G8929 Other chronic pain: Secondary | ICD-10-CM | POA: Diagnosis not present

## 2020-02-10 NOTE — Therapy (Signed)
[facility] [address]  [place], [place], [address] Phone: [phone]   Fax:  [phone]  Physical Therapy Treatment  Patient Details  Name: Debra Wright MRN: [id number] Date of Birth: Jan 07, 1951 Referring Provider (PT): Erskine Emery, PA-C   Encounter Date: 02/10/2020  PT End of Session - 02/10/20 1456    Visit Number  2    Number of Visits  13    Date for PT Re-Evaluation  03/16/20    Authorization Type  Medicare & AARP    PT Start Time  1450    PT Stop Time  1530    PT Time Calculation (min)  40 min    Activity Tolerance  Patient tolerated treatment well;Patient limited by pain    Behavior During Therapy  [facility] for tasks assessed/performed       Past Medical History:  Diagnosis Date  . Anxiety   . Arthritis    "knees; right thumb" (10/14/2014)  . Basal cell carcinoma    "burned off upper lip & right shoulder"  . Chronic anticoagulation    CHADS VASC=3  . Complication of anesthesia    after spinal anesthesia for left knee replacement, bladder did "not wake up". Was incontinent for 4 days post surgery  . Depression   . Fibroid tumor   . Frequent diarrhea   . GERD (gastroesophageal reflux disease)   . High cholesterol   . Hypertension    EF 65-70% grade 2DD  . Insomnia   . Migraine    "frequently when I was younger; maybe monthly now" (10/14/2014) - none after hysterectomy  . PAF (paroxysmal atrial fibrillation) ([place]) 05/02/2018   converted with rate control  . Pernicious anemia    pt not aware of this  . Sleep apnea    uses a cpap  . Type II diabetes mellitus ([place])   . Vitamin B 12 deficiency     Past Surgical History:  Procedure Laterality Date  . ABDOMINAL HYSTERECTOMY  1988  . ACHILLES TENDON SURGERY Right   . APPENDECTOMY  1988  . BILATERAL OOPHORECTOMY  2004  . CARPAL TUNNEL RELEASE Bilateral 1986  . CATARACT EXTRACTION W/ INTRAOCULAR LENS  IMPLANT, BILATERAL Bilateral 2011  .  COLONOSCOPY    . JOINT REPLACEMENT    . SHOULDER ARTHROSCOPY DISTAL CLAVICLE EXCISION AND OPEN ROTATOR CUFF REPAIR Right 11/2013  . TOENAIL EXCISION Right 04/2018   "big toe"  . TONSILLECTOMY  1950's  . TOTAL KNEE ARTHROPLASTY Left 10/13/2014   Procedure: LEFT TOTAL KNEE ARTHROPLASTY;  Surgeon: Mcarthur Rossetti, MD;  Location: [facility];  Service: Orthopedics;  Laterality: Left;  . TOTAL KNEE ARTHROPLASTY Right 12/10/2018   Procedure: RIGHT TOTAL KNEE ARTHROPLASTY;  Surgeon: Mcarthur Rossetti, MD;  Location: [facility];  Service: Orthopedics;  Laterality: Right;    There were no vitals filed for this visit.  Subjective Assessment - 02/10/20 1453    Subjective  pt. admitting to poor adherence to HEP since visiting mother in [place] over this past week.    Pertinent History  DMII, anemia, a-fib, migraines, HLT, HLD, GERD, depression, anxiety, R achilles tendon surgery, B carpal tunnel release, R RTC repair 2015, B TKA    Diagnostic tests  01/28/20 R elbow: no acute findings.  Some minimal arthritic changes about the elbow.  Elbow is well located.  No bony abnormalities otherwise.    Patient Stated Goals  "have better use of her shoulder and arm"  Currently in Pain?  Yes    Pain Score  0-No pain   up to a 5/10 reaching behind head with quick recovery   Pain Location  Shoulder    Pain Descriptors / Indicators  Aching;Sharp    Pain Type  Chronic pain    Pain Radiating Towards  down the lateral arm    Pain Frequency  Intermittent    Aggravating Factors   lifting fork, reaching behind head    Pain Relieving Factors  rest                       [facility] Adult PT Treatment/Exercise - 02/10/20 0001      Elbow Exercises   Elbow Flexion  Right;10 reps;Bar weights/barbell;Strengthening;Seated    Bar Weights/Barbell (Elbow Flexion)  1 lb    Forearm Supination  Right;10 reps;Strengthening;Bar weights/barbell    Bar Weights/Barbell (Forearm Supination)  1 lb    Forearm Supination  Limitations  ROM and strengthening       Shoulder Exercises: Supine   External Rotation  Right;10 reps;AAROM    External Rotation Limitations  wand with heavy instruction for positioning     Flexion  Right;10 reps;AAROM    Flexion Limitations  wand - cues for 5" hold       Shoulder Exercises: Seated   Abduction  Right;10 reps;AAROM   excessive scapular "hiking" with full abduction wand AAROM   ABduction Limitations  wand - minor cueing for scaption/abd and hand position       Shoulder Exercises: Pulleys   Flexion  3 minutes    Flexion Limitations  to tolerance    Scaption  3 minutes    Scaption Limitations  to tolerance       Modalities   Modalities  Moist Heat      Moist Heat Therapy   Number Minutes Moist Heat  10 Minutes    Moist Heat Location  Shoulder   B upper shoulders      Manual Therapy   Manual Therapy  Soft tissue mobilization;Myofascial release    Manual therapy comments  seated     Soft tissue mobilization  R UT, LS, cervical paraspinals, R posterior/inferior shoulder     Myofascial Release  TPR to R UT             PT Education - 02/10/20 1813    Education provided  Yes    Education Details  HEP update;  supine AAROM wand ER    Person(s) Educated  Patient    Methods  Explanation;Demonstration;Verbal cues;Handout    Comprehension  Verbalized understanding;Returned demonstration;Verbal cues required       PT Short Term Goals - 02/10/20 1457      PT SHORT TERM GOAL #1   Title  Independent with initial HEP     Time  3    Period  Weeks    Status  On-going    Target Date  02/24/20        PT Long Term Goals - 02/10/20 1457      PT LONG TERM GOAL #1   Title  Independent with advanced HEP    Time  6    Period  Weeks    Status  On-going      PT LONG TERM GOAL #2   Title  Patient to demonstrate R shoulder and elbow AROM WFL and without pain limiting.    Time  6    Period  Weeks  Status  On-going      PT LONG TERM GOAL #3   Title   Patient to demonstrate R shoulder strength >/=4+/5.    Time  6    Period  Weeks    Status  On-going      PT LONG TERM GOAL #4   Title  Patient to demonstrate overhead lift with 5lbs without pain and with good scapular mechanics.    Time  6    Period  Weeks    Status  On-going      PT LONG TERM GOAL #5   Title  Patient to report 75% improvement in ability to blow dry her hair.    Time  6    Period  Weeks    Status  On-going            Plan - 02/10/20 1458    Clinical Impression Statement  Debra Wright doing fine today.  With some confusion regarding technique with supine AAROM wand ER thus provided enlarged picture handout and instruction on technique with pt. verbalizing understanding and able to feel appropriate stretch sensation at R shoulder.  Tolerated addition of elbow ROM activities well today and MT addressing increased tension/tone in R UT.  Ended visit with moist heat to B upper shoulders for relaxation.    Comorbidities  DMII, anemia, a-fib, migraines, HLT, HLD, GERD, depression, anxiety, R achilles tendon surgery, B carpal tunnel release, R RTC repair 2015, B TKA    Rehab Potential  Good    PT Frequency  2x / week    PT Treatment/Interventions  ADLs/Self Care Home Management;Cryotherapy;Electrical Stimulation;Iontophoresis 4mg /ml Dexamethasone;Moist Heat;Therapeutic exercise;Balance training;Therapeutic activities;Functional mobility training;Ultrasound;Neuromuscular re-education;Patient/family education;Manual techniques;Vasopneumatic Device;Taping;Energy conservation;Dry needling;Passive range of motion    PT Next Visit Plan  Progress R shoulder ROM and elbow supination    Consulted and Agree with Plan of Care  Patient       Patient will benefit from skilled therapeutic intervention in order to improve the following deficits and impairments:  Hypomobility, Increased edema, Decreased activity tolerance, Decreased strength, Impaired UE functional use, Pain, Increased fascial  restricitons, Increased muscle spasms, Decreased range of motion, Improper body mechanics, Postural dysfunction, Impaired flexibility  Visit Diagnosis: Chronic right shoulder pain  Stiffness of right shoulder, not elsewhere classified  Stiffness of right elbow, not elsewhere classified  Other symptoms and signs involving the musculoskeletal system     Problem List Patient Active Problem List   Diagnosis Date Noted  . Unilateral primary osteoarthritis, right knee 12/10/2018  . Status post total right knee replacement 12/10/2018  . Long term (current) use of anticoagulants 08/16/2018  . Hypertensive cardiovascular disease 06/04/2018  . Morbid obesity with BMI of 40.0-44.9, adult ([place]) 06/04/2018  . Fatigue 06/04/2018  . Chronic anticoagulation   . PAF (paroxysmal atrial fibrillation) ([place]) 05/02/2018  . Primary osteoarthritis of right knee 06/14/2017  . OSA (obstructive sleep apnea) 02/13/2017  . NIDDM-type 2 10/31/2016  . Essential hypertension 08/02/2016  . Anxiety and depression 08/02/2016  . Insomnia 08/02/2016  . Arthritis of left knee 10/13/2014  . Status post total left knee replacement 10/13/2014    Bess Harvest, PTA 02/10/20 6:19 PM   [facility] [address]  [place], [place], [address] Phone: [phone]   Fax:  [phone]  Name: Debra Wright MRN: [id number] Date of Birth: April 29, 1951

## 2020-02-11 ENCOUNTER — Encounter (INDEPENDENT_AMBULATORY_CARE_PROVIDER_SITE_OTHER): Payer: Self-pay | Admitting: Family Medicine

## 2020-02-11 ENCOUNTER — Other Ambulatory Visit (INDEPENDENT_AMBULATORY_CARE_PROVIDER_SITE_OTHER): Payer: Self-pay | Admitting: Family Medicine

## 2020-02-11 MED ORDER — ESZOPICLONE 3 MG PO TABS: 3.0000 mg | ORAL_TABLET | Freq: Every evening | ORAL | 0 refills | Status: DC | PRN

## 2020-02-12 ENCOUNTER — Encounter: Payer: Self-pay | Admitting: Physical Therapy

## 2020-02-12 ENCOUNTER — Ambulatory Visit: Payer: Medicare Other | Admitting: Physical Therapy

## 2020-02-12 ENCOUNTER — Other Ambulatory Visit: Payer: Self-pay

## 2020-02-12 DIAGNOSIS — M25511 Pain in right shoulder: Secondary | ICD-10-CM | POA: Diagnosis not present

## 2020-02-12 DIAGNOSIS — M25611 Stiffness of right shoulder, not elsewhere classified: Secondary | ICD-10-CM

## 2020-02-12 DIAGNOSIS — M25621 Stiffness of right elbow, not elsewhere classified: Secondary | ICD-10-CM | POA: Diagnosis not present

## 2020-02-12 DIAGNOSIS — G8929 Other chronic pain: Secondary | ICD-10-CM | POA: Diagnosis not present

## 2020-02-12 DIAGNOSIS — R29898 Other symptoms and signs involving the musculoskeletal system: Secondary | ICD-10-CM

## 2020-02-12 NOTE — Therapy (Signed)
[facility] [address]  [place], [place], [address] Phone: [phone]   Fax:  [phone]  Physical Therapy Treatment  Patient Details  Name: Debra Wright MRN: [id number] Date of Birth: 1951/05/05 Referring Provider (PT): Erskine Emery, PA-C   Encounter Date: 02/12/2020  PT End of Session - 02/12/20 [id number]    Visit Number  3    Number of Visits  13    Date for PT Re-Evaluation  03/16/20    Authorization Type  Medicare & AARP    PT Start Time  [id number]    PT Stop Time  1528    PT Time Calculation (min)  42 min    Activity Tolerance  Patient tolerated treatment well;Patient limited by pain    Behavior During Therapy  [facility] for tasks assessed/performed       Past Medical History:  Diagnosis Date  . Anxiety   . Arthritis    "knees; right thumb" (10/14/2014)  . Basal cell carcinoma    "burned off upper lip & right shoulder"  . Chronic anticoagulation    CHADS VASC=3  . Complication of anesthesia    after spinal anesthesia for left knee replacement, bladder did "not wake up". Was incontinent for 4 days post surgery  . Depression   . Fibroid tumor   . Frequent diarrhea   . GERD (gastroesophageal reflux disease)   . High cholesterol   . Hypertension    EF 65-70% grade 2DD  . Insomnia   . Migraine    "frequently when I was younger; maybe monthly now" (10/14/2014) - none after hysterectomy  . PAF (paroxysmal atrial fibrillation) ([place]) 05/02/2018   converted with rate control  . Pernicious anemia    pt not aware of this  . Sleep apnea    uses a cpap  . Type II diabetes mellitus ([place])   . Vitamin B 12 deficiency     Past Surgical History:  Procedure Laterality Date  . ABDOMINAL HYSTERECTOMY  1988  . ACHILLES TENDON SURGERY Right   . APPENDECTOMY  1988  . BILATERAL OOPHORECTOMY  2004  . CARPAL TUNNEL RELEASE Bilateral 1986  . CATARACT EXTRACTION W/ INTRAOCULAR LENS  IMPLANT, BILATERAL Bilateral 2011  .  COLONOSCOPY    . JOINT REPLACEMENT    . SHOULDER ARTHROSCOPY DISTAL CLAVICLE EXCISION AND OPEN ROTATOR CUFF REPAIR Right 11/2013  . TOENAIL EXCISION Right 04/2018   "big toe"  . TONSILLECTOMY  1950's  . TOTAL KNEE ARTHROPLASTY Left 10/13/2014   Procedure: LEFT TOTAL KNEE ARTHROPLASTY;  Surgeon: Mcarthur Rossetti, MD;  Location: [facility];  Service: Orthopedics;  Laterality: Left;  . TOTAL KNEE ARTHROPLASTY Right 12/10/2018   Procedure: RIGHT TOTAL KNEE ARTHROPLASTY;  Surgeon: Mcarthur Rossetti, MD;  Location: [facility];  Service: Orthopedics;  Laterality: Right;    There were no vitals filed for this visit.  Subjective Assessment - 02/12/20 1446    Subjective  Feels like she is having some soreness in the opposite shoulder after performing her exercises.    Pertinent History  DMII, anemia, a-fib, migraines, HLT, HLD, GERD, depression, anxiety, R achilles tendon surgery, B carpal tunnel release, R RTC repair 2015, B TKA    Diagnostic tests  01/28/20 R elbow: no acute findings.  Some minimal arthritic changes about the elbow.  Elbow is well located.  No bony abnormalities otherwise.    Patient Stated Goals  "have better use of her shoulder and arm"  Currently in Pain?  Yes    Pain Score  3     Pain Location  Shoulder    Pain Orientation  Right    Pain Descriptors / Indicators  Dull    Pain Type  Chronic pain                        [facility] Adult PT Treatment/Exercise - 02/12/20 0001      Exercises   Exercises  Shoulder      Elbow Exercises   Forearm Supination  Right;10 reps;Strengthening;Bar weights/barbell    Bar Weights/Barbell (Forearm Supination)  2 lbs    Forearm Supination Limitations  2nd set with mid-grip on hammer; 10x10" hold in supination for stretch      Shoulder Exercises: Seated   External Rotation  AAROM;Right;10 reps    External Rotation Limitations  with wand and towel under elbow    Internal Rotation  AAROM;Right;10 reps    Internal Rotation  Limitations  with wand and towel under elbow    Flexion  AAROM;Right;10 reps    Flexion Limitations  with wand   c/o painful arc at ~100deg   Abduction  Right;10 reps;AAROM    ABduction Limitations  with wand; holding onto handle of cane    cues to avoid lateral trunk rotation to compensate     Shoulder Exercises: Standing   External Rotation  Strengthening;Right;10 reps;Theraband    Theraband Level (Shoulder External Rotation)  Level 1 (Yellow)    External Rotation Limitations  isometric stepouts with towel under elbow   difficulty   Internal Rotation  Strengthening;Right;10 reps;Theraband    Theraband Level (Shoulder Internal Rotation)  Level 1 (Yellow)    Internal Rotation Limitations  isometric stepouts with towel under elbow   good ability to maintain neutral     Shoulder Exercises: Pulleys   Flexion  3 minutes    Flexion Limitations  to tolerance    Scaption  3 minutes    Scaption Limitations  to tolerance       Manual Therapy   Manual Therapy  Soft tissue mobilization;Myofascial release    Manual therapy comments  supine    Soft tissue mobilization  STM to R biceps muscle belly, proximal biceps tendon, and pec- very TTP and with multiple painful trigger points evident    Myofascial Release  manual TPR to R biceps muscle belly and pec             PT Education - 02/12/20 1532    Education provided  Yes    Education Details  update to [employer]) Educated  Patient    Methods  Explanation;Demonstration;Tactile cues;Verbal cues;Handout    Comprehension  Verbalized understanding;Returned demonstration       PT Short Term Goals - 02/12/20 1535      PT SHORT TERM GOAL #1   Title  Independent with initial HEP     Time  3    Period  Weeks    Status  On-going    Target Date  02/24/20        PT Long Term Goals - 02/10/20 1457      PT LONG TERM GOAL #1   Title  Independent with advanced HEP    Time  6    Period  Weeks    Status  On-going      PT LONG  TERM GOAL #2   Title  Patient to demonstrate R shoulder and elbow  AROM WFL and without pain limiting.    Time  6    Period  Weeks    Status  On-going      PT LONG TERM GOAL #3   Title  Patient to demonstrate R shoulder strength >/=4+/5.    Time  6    Period  Weeks    Status  On-going      PT LONG TERM GOAL #4   Title  Patient to demonstrate overhead lift with 5lbs without pain and with good scapular mechanics.    Time  6    Period  Weeks    Status  On-going      PT LONG TERM GOAL #5   Title  Patient to report 75% improvement in ability to blow dry her hair.    Time  6    Period  Weeks    Status  On-going            Plan - 02/12/20 1533    Clinical Impression Statement  Patient noting soreness in the opposite shoulder after performing HEP but pain-free on this side today. Worked on sitting shoulder AAROM with patient demonstrating compensation with lateral trunk rotation with abduction, requiring cues to correct. Reported painful arc of motion with R shoulder flexion AAROM at about 100 degrees, but able to continue. Tolerated an increase in weighted resistance with supination stretch today. Initiated isometric RTC strengthening with patient demonstrating good form with IR; more difficulty with ER. Patient tolerated STM and TPR to R biceps muscle belly, proximal biceps tendon, and pec. Patient very TTP and with multiple painful trigger points evident throughout. Patient reported no complaints at end of session. Patient progressing well towards goals.    Comorbidities  DMII, anemia, a-fib, migraines, HLT, HLD, GERD, depression, anxiety, R achilles tendon surgery, B carpal tunnel release, R RTC repair 2015, B TKA    Rehab Potential  Good    PT Frequency  2x / week    PT Treatment/Interventions  ADLs/Self Care Home Management;Cryotherapy;Electrical Stimulation;Iontophoresis 4mg /ml Dexamethasone;Moist Heat;Therapeutic exercise;Balance training;Therapeutic activities;Functional mobility  training;Ultrasound;Neuromuscular re-education;Patient/family education;Manual techniques;Vasopneumatic Device;Taping;Energy conservation;Dry needling;Passive range of motion    PT Next Visit Plan  Progress R shoulder ROM and elbow supination    Consulted and Agree with Plan of Care  Patient       Patient will benefit from skilled therapeutic intervention in order to improve the following deficits and impairments:  Hypomobility, Increased edema, Decreased activity tolerance, Decreased strength, Impaired UE functional use, Pain, Increased fascial restricitons, Increased muscle spasms, Decreased range of motion, Improper body mechanics, Postural dysfunction, Impaired flexibility  Visit Diagnosis: Chronic right shoulder pain  Stiffness of right shoulder, not elsewhere classified  Stiffness of right elbow, not elsewhere classified  Other symptoms and signs involving the musculoskeletal system     Problem List Patient Active Problem List   Diagnosis Date Noted  . Unilateral primary osteoarthritis, right knee 12/10/2018  . Status post total right knee replacement 12/10/2018  . Long term (current) use of anticoagulants 08/16/2018  . Hypertensive cardiovascular disease 06/04/2018  . Morbid obesity with BMI of 40.0-44.9, adult ([place]) 06/04/2018  . Fatigue 06/04/2018  . Chronic anticoagulation   . PAF (paroxysmal atrial fibrillation) ([place]) 05/02/2018  . Primary osteoarthritis of right knee 06/14/2017  . OSA (obstructive sleep apnea) 02/13/2017  . NIDDM-type 2 10/31/2016  . Essential hypertension 08/02/2016  . Anxiety and depression 08/02/2016  . Insomnia 08/02/2016  . Arthritis of left knee 10/13/2014  . Status post total left  knee replacement 10/13/2014     Janene Harvey, PT, DPT 02/12/20 3:36 PM   [facility] [address]  [place], [place], [address] Phone: [phone]   Fax:  [phone]  Name: Debra Wright MRN: [id number] Date of Birth: 24-Jun-1951

## 2020-02-17 ENCOUNTER — Ambulatory Visit: Payer: Medicare Other

## 2020-02-17 ENCOUNTER — Telehealth: Payer: Self-pay | Admitting: *Deleted

## 2020-02-17 DIAGNOSIS — M48062 Spinal stenosis, lumbar region with neurogenic claudication: Secondary | ICD-10-CM | POA: Diagnosis not present

## 2020-02-17 DIAGNOSIS — M4310 Spondylolisthesis, site unspecified: Secondary | ICD-10-CM | POA: Diagnosis not present

## 2020-02-17 DIAGNOSIS — M5136 Other intervertebral disc degeneration, lumbar region: Secondary | ICD-10-CM | POA: Diagnosis not present

## 2020-02-17 NOTE — Telephone Encounter (Signed)
   [facility] Pre-operative Risk Assessment      Request for surgical clearance:  1. What type of surgery is being performed? LEFT DIRECTED ESI L5-S1   2. When is this surgery scheduled? 03/23/2020    3. What type of clearance is required (medical clearance vs. Pharmacy clearance to hold med vs. Both)? MEDS  4. Are there any medications that need to be held prior to surgery and how long?COUMADIN FOR 5 DAYS    5. Practice name and name of physician performing surgery? [facility]   6. What is the office phone number? [phone]   7.   What is the office fax number? [phone]   8.   Anesthesia type (None, local, MAC, general) ?

## 2020-02-18 NOTE — Telephone Encounter (Signed)
   Primary Cardiologist: Shelva Majestic, MD  Chart reviewed as part of pre-operative protocol coverage. Given past medical history and time since last visit, based on ACC/AHA guidelines, Debra Wright would be at acceptable risk for the planned procedure without further cardiovascular testing.   Per pharmacy:  Patient with diagnosis of atrial fibrillation on warfarin for anticoagulation.    Procedure: left directed ESI L5-S1 Date of procedure: 03/23/20  CHADS2-VASc score of  3 (HTN, AGE, female)  CrCl 84.1 (with IBW) Platelet count 246  Per office protocol, patient can hold warfarin for 5 days prior to procedure.    Patient will not need bridging with Lovenox (enoxaparin) around procedure.  I will route this recommendation to the requesting party via Epic fax function and remove from pre-op pool.  Please call with questions.  Kathyrn Drown, NP 02/18/2020, 9:16 AM

## 2020-02-18 NOTE — Telephone Encounter (Signed)
Patient with diagnosis of atrial fibrillation on warfarin for anticoagulation.    Procedure: left directed ESI L5-S1 Date of procedure: 03/23/20  CHADS2-VASc score of  3 (HTN, AGE, female)  CrCl 84.1 (with IBW) Platelet count 246  Per office protocol, patient can hold warfarin for 5 days prior to procedure.    Patient will not need bridging with Lovenox (enoxaparin) around procedure.

## 2020-02-19 ENCOUNTER — Ambulatory Visit: Payer: Medicare Other | Admitting: Physical Therapy

## 2020-02-19 ENCOUNTER — Other Ambulatory Visit: Payer: Self-pay

## 2020-02-19 ENCOUNTER — Encounter: Payer: Self-pay | Admitting: Physical Therapy

## 2020-02-19 DIAGNOSIS — R29898 Other symptoms and signs involving the musculoskeletal system: Secondary | ICD-10-CM

## 2020-02-19 DIAGNOSIS — G8929 Other chronic pain: Secondary | ICD-10-CM | POA: Diagnosis not present

## 2020-02-19 DIAGNOSIS — M25611 Stiffness of right shoulder, not elsewhere classified: Secondary | ICD-10-CM

## 2020-02-19 DIAGNOSIS — M25621 Stiffness of right elbow, not elsewhere classified: Secondary | ICD-10-CM | POA: Diagnosis not present

## 2020-02-19 DIAGNOSIS — M25511 Pain in right shoulder: Secondary | ICD-10-CM | POA: Diagnosis not present

## 2020-02-19 NOTE — Therapy (Signed)
[facility] [address]  [place], [place], [address] Phone: [phone]   Fax:  [phone]  Physical Therapy Treatment  Patient Details  Name: Debra Wright MRN: [id number] Date of Birth: [birth year] Referring Provider (PT): Erskine Emery, PA-C   Encounter Date: 02/19/2020  PT End of Session - 02/19/20 1533    Visit Number  4    Number of Visits  13    Date for PT Re-Evaluation  03/16/20    Authorization Type  Medicare & AARP    PT Start Time  1450    PT Stop Time  1528    PT Time Calculation (min)  38 min    Activity Tolerance  Patient tolerated treatment well    Behavior During Therapy  [facility] for tasks assessed/performed       Past Medical History:  Diagnosis Date  . Anxiety   . Arthritis    "knees; right thumb" (10/14/2014)  . Basal cell carcinoma    "burned off upper lip & right shoulder"  . Chronic anticoagulation    CHADS VASC=3  . Complication of anesthesia    after spinal anesthesia for left knee replacement, bladder did "not wake up". Was incontinent for 4 days post surgery  . Depression   . Fibroid tumor   . Frequent diarrhea   . GERD (gastroesophageal reflux disease)   . High cholesterol   . Hypertension    EF 65-70% grade 2DD  . Insomnia   . Migraine    "frequently when I was younger; maybe monthly now" (10/14/2014) - none after hysterectomy  . PAF (paroxysmal atrial fibrillation) ([place]) 05/02/2018   converted with rate control  . Pernicious anemia    pt not aware of this  . Sleep apnea    uses a cpap  . Type II diabetes mellitus ([place])   . Vitamin B 12 deficiency     Past Surgical History:  Procedure Laterality Date  . ABDOMINAL HYSTERECTOMY  1988  . ACHILLES TENDON SURGERY Right   . APPENDECTOMY  1988  . BILATERAL OOPHORECTOMY  2004  . CARPAL TUNNEL RELEASE Bilateral 1986  . CATARACT EXTRACTION W/ INTRAOCULAR LENS  IMPLANT, BILATERAL Bilateral 2011  . COLONOSCOPY    . JOINT  REPLACEMENT    . SHOULDER ARTHROSCOPY DISTAL CLAVICLE EXCISION AND OPEN ROTATOR CUFF REPAIR Right 11/2013  . TOENAIL EXCISION Right 04/2018   "big toe"  . TONSILLECTOMY  1950's  . TOTAL KNEE ARTHROPLASTY Left 10/13/2014   Procedure: LEFT TOTAL KNEE ARTHROPLASTY;  Surgeon: Mcarthur Rossetti, MD;  Location: [facility];  Service: Orthopedics;  Laterality: Left;  . TOTAL KNEE ARTHROPLASTY Right 12/10/2018   Procedure: RIGHT TOTAL KNEE ARTHROPLASTY;  Surgeon: Mcarthur Rossetti, MD;  Location: [facility];  Service: Orthopedics;  Laterality: Right;    There were no vitals filed for this visit.  Subjective Assessment - 02/19/20 1452    Subjective  Not much new. Has been working on [employer]. Shoulder feels about the same.    Pertinent History  DMII, anemia, a-fib, migraines, HLT, HLD, GERD, depression, anxiety, R achilles tendon surgery, B carpal tunnel release, R RTC repair 2015, B TKA    Diagnostic tests  01/28/20 R elbow: no acute findings.  Some minimal arthritic changes about the elbow.  Elbow is well located.  No bony abnormalities otherwise.    Patient Stated Goals  "have better use of her shoulder and arm"    Currently in Pain?  No/denies  [facility] Adult PT Treatment/Exercise - 02/19/20 0001      Elbow Exercises   Forearm Supination  Right;10 reps;Strengthening;Bar weights/barbell    Forearm Supination Limitations  10x10" hold with mid grip on hammer      Shoulder Exercises: Supine   External Rotation  Right;10 reps;AAROM    External Rotation Limitations  with wand; cues to maintain elbow at side    Internal Rotation  AAROM;Right;10 reps    Internal Rotation Limitations  with wand; cues to maintain elbow at side    Flexion  Right;10 reps;AAROM    Flexion Limitations  with wand; cues to decrease speed   good ROM   ABduction  AAROM;Right;10 reps    ABduction Limitations  with wand; cues to decrease speed   good ROM     Shoulder Exercises: Sidelying    External Rotation  Strengthening;Right;10 reps    External Rotation Weight (lbs)  1    External Rotation Limitations  elbow staying at side   cues for proper motion   ABduction  Strengthening;Right;10 reps;Weights    ABduction Weight (lbs)  1    ABduction Limitations  thumb up; cues to avoid pushing into pain      Shoulder Exercises: Standing   External Rotation  Strengthening;Right;10 reps;Theraband    Theraband Level (Shoulder External Rotation)  Level 1 (Yellow)    External Rotation Limitations  towel roll under elbow   difficulty d/t weakness   Internal Rotation  Strengthening;Right;10 reps;Theraband    Theraband Level (Shoulder Internal Rotation)  Level 1 (Yellow)    Internal Rotation Limitations  towel roll under elbow   cues to avoid trunk rotation     Shoulder Exercises: ROM/Strengthening   UBE (Upper Arm Bike)  L1 x 3 min forwad/3 min back      Manual Therapy   Manual Therapy  Joint mobilization    Manual therapy comments  sitting    Joint Mobilization  R distal radioulnar AP joint mobs grade III/IV to tolerance for improvement in supination             PT Education - 02/19/20 1532    Education Details  edu on mechanics/anatomy of the elbow joint and patient's area of most restriction in the distal radioulnar joint    Person(s) Educated  Patient    Methods  Explanation;Demonstration    Comprehension  Verbalized understanding       PT Short Term Goals - 02/12/20 1535      PT SHORT TERM GOAL #1   Title  Independent with initial HEP     Time  3    Period  Weeks    Status  On-going    Target Date  02/24/20        PT Long Term Goals - 02/10/20 1457      PT LONG TERM GOAL #1   Title  Independent with advanced HEP    Time  6    Period  Weeks    Status  On-going      PT LONG TERM GOAL #2   Title  Patient to demonstrate R shoulder and elbow AROM WFL and without pain limiting.    Time  6    Period  Weeks    Status  On-going      PT LONG TERM GOAL #3    Title  Patient to demonstrate R shoulder strength >/=4+/5.    Time  6    Period  Weeks    Status  On-going  PT LONG TERM GOAL #4   Title  Patient to demonstrate overhead lift with 5lbs without pain and with good scapular mechanics.    Time  6    Period  Weeks    Status  On-going      PT LONG TERM GOAL #5   Title  Patient to report 75% improvement in ability to blow dry her hair.    Time  6    Period  Weeks    Status  On-going            Plan - 02/19/20 1533    Clinical Impression Statement  Patient arrived to session without new complaints. Reviewed shoulder AAROM with wand with patient demonstrating good ROM into flexion and abduction; more difficulty with ER. Initiated sidelying shoulder strengthening with patient demonstrating some difficulty with resisted ER d/t limited ROM and weakness. Patient visibly showing improvement in supination today despite continued difficulty at distal radioulnar joint. Patient tolerated joint mobilizations well, but with hypomobility noted. Patient able to perform shoulder IR/ER with light banded resistance with good tolerance and intermittent cues for form. No complaints at end of session.    Comorbidities  DMII, anemia, a-fib, migraines, HLT, HLD, GERD, depression, anxiety, R achilles tendon surgery, B carpal tunnel release, R RTC repair 2015, B TKA    Rehab Potential  Good    PT Frequency  2x / week    PT Treatment/Interventions  ADLs/Self Care Home Management;Cryotherapy;Electrical Stimulation;Iontophoresis 4mg /ml Dexamethasone;Moist Heat;Therapeutic exercise;Balance training;Therapeutic activities;Functional mobility training;Ultrasound;Neuromuscular re-education;Patient/family education;Manual techniques;Vasopneumatic Device;Taping;Energy conservation;Dry needling;Passive range of motion    PT Next Visit Plan  Progress R shoulder ROM and elbow supination    Consulted and Agree with Plan of Care  Patient       Patient will benefit from  skilled therapeutic intervention in order to improve the following deficits and impairments:  Hypomobility, Increased edema, Decreased activity tolerance, Decreased strength, Impaired UE functional use, Pain, Increased fascial restricitons, Increased muscle spasms, Decreased range of motion, Improper body mechanics, Postural dysfunction, Impaired flexibility  Visit Diagnosis: Chronic right shoulder pain  Stiffness of right shoulder, not elsewhere classified  Stiffness of right elbow, not elsewhere classified  Other symptoms and signs involving the musculoskeletal system     Problem List Patient Active Problem List   Diagnosis Date Noted  . Unilateral primary osteoarthritis, right knee 12/10/2018  . Status post total right knee replacement 12/10/2018  . Long term (current) use of anticoagulants 08/16/2018  . Hypertensive cardiovascular disease 06/04/2018  . Morbid obesity with BMI of 40.0-44.9, adult ([place]) 06/04/2018  . Fatigue 06/04/2018  . Chronic anticoagulation   . PAF (paroxysmal atrial fibrillation) ([place]) 05/02/2018  . Primary osteoarthritis of right knee 06/14/2017  . OSA (obstructive sleep apnea) 02/13/2017  . NIDDM-type 2 10/31/2016  . Essential hypertension 08/02/2016  . Anxiety and depression 08/02/2016  . Insomnia 08/02/2016  . Arthritis of left knee 10/13/2014  . Status post total left knee replacement 10/13/2014     Janene Harvey, PT, DPT 02/19/20 3:37 PM   [facility] [address]  [place], [place], [address] Phone: [phone]   Fax:  [phone]  Name: Debra Wright MRN: [id number] Date of Birth: December 25, 1950

## 2020-02-20 ENCOUNTER — Telehealth: Payer: Self-pay | Admitting: Cardiovascular Disease

## 2020-02-20 NOTE — Telephone Encounter (Signed)
Follow Up:     Jarrett Soho from Dr Maryjean Ka  is checking on the status of the clearance that was faxed over on 02-17-20. Please fax this to [phone].t

## 2020-02-23 ENCOUNTER — Other Ambulatory Visit: Payer: Self-pay

## 2020-02-23 ENCOUNTER — Other Ambulatory Visit: Payer: Self-pay | Admitting: Cardiovascular Disease

## 2020-02-23 MED ORDER — WARFARIN SODIUM 5 MG PO TABS: ORAL_TABLET | ORAL | 0 refills | Status: DC

## 2020-02-24 ENCOUNTER — Ambulatory Visit: Payer: Medicare Other

## 2020-02-24 NOTE — Telephone Encounter (Signed)
Primary Cardiologist: Shelva Majestic, MD  Chart reviewed as part of pre-operative protocol coverage. Given past medical history and time since last visit, based on ACC/AHA guidelines, Debra Wright would be at acceptable risk for the planned procedure without further cardiovascular testing.   Per pharmacy:  Patient with diagnosis ofatrial fibrillationon warfarinfor anticoagulation.   Procedure:left directed ESI L5-S1 Date of procedure:03/23/20  CHADS2-VASc score of3(HTN, AGE, female)  CrCl84.1 (with IBW) Platelet count246  Per office protocol, patient can holdwarfarinfor 5days prior to procedure.   Patient will not need bridging with Lovenox (enoxaparin) around procedure.  I will route this recommendation to the requesting party via Epic fax function and remove from pre-op pool.  Please call with questions.  Kathyrn Drown, NP 02/18/2020, 9:16 AM

## 2020-02-25 ENCOUNTER — Ambulatory Visit: Payer: Medicare Other | Admitting: Physician Assistant

## 2020-02-26 ENCOUNTER — Other Ambulatory Visit: Payer: Self-pay

## 2020-02-26 ENCOUNTER — Encounter: Payer: Self-pay | Admitting: Physical Therapy

## 2020-02-26 ENCOUNTER — Ambulatory Visit: Payer: Medicare Other | Admitting: Physical Therapy

## 2020-02-26 DIAGNOSIS — M25621 Stiffness of right elbow, not elsewhere classified: Secondary | ICD-10-CM | POA: Diagnosis not present

## 2020-02-26 DIAGNOSIS — R29898 Other symptoms and signs involving the musculoskeletal system: Secondary | ICD-10-CM

## 2020-02-26 DIAGNOSIS — M25611 Stiffness of right shoulder, not elsewhere classified: Secondary | ICD-10-CM

## 2020-02-26 DIAGNOSIS — G8929 Other chronic pain: Secondary | ICD-10-CM

## 2020-02-26 DIAGNOSIS — M25511 Pain in right shoulder: Secondary | ICD-10-CM | POA: Diagnosis not present

## 2020-02-26 NOTE — Therapy (Signed)
[facility] [address]  [place], [place], [address] Phone: [phone]   Fax:  [phone]  Physical Therapy Treatment  Patient Details  Name: Debra Wright MRN: [id number] Date of Birth: 1951/06/02 Referring Provider (PT): Erskine Emery, PA-C   Encounter Date: 02/26/2020  PT End of Session - 02/26/20 1528    Visit Number  5    Number of Visits  13    Date for PT Re-Evaluation  03/16/20    Authorization Type  Medicare & AARP    PT Start Time  1450    PT Stop Time  1529    PT Time Calculation (min)  39 min    Activity Tolerance  Patient tolerated treatment well;Patient limited by pain    Behavior During Therapy  [facility] for tasks assessed/performed       Past Medical History:  Diagnosis Date  . Anxiety   . Arthritis    "knees; right thumb" (10/14/2014)  . Basal cell carcinoma    "burned off upper lip & right shoulder"  . Chronic anticoagulation    CHADS VASC=3  . Complication of anesthesia    after spinal anesthesia for left knee replacement, bladder did "not wake up". Was incontinent for 4 days post surgery  . Depression   . Fibroid tumor   . Frequent diarrhea   . GERD (gastroesophageal reflux disease)   . High cholesterol   . Hypertension    EF 65-70% grade 2DD  . Insomnia   . Migraine    "frequently when I was younger; maybe monthly now" (10/14/2014) - none after hysterectomy  . PAF (paroxysmal atrial fibrillation) ([place]) 05/02/2018   converted with rate control  . Pernicious anemia    pt not aware of this  . Sleep apnea    uses a cpap  . Type II diabetes mellitus ([place])   . Vitamin B 12 deficiency     Past Surgical History:  Procedure Laterality Date  . ABDOMINAL HYSTERECTOMY  1988  . ACHILLES TENDON SURGERY Right   . APPENDECTOMY  1988  . BILATERAL OOPHORECTOMY  2004  . CARPAL TUNNEL RELEASE Bilateral 1986  . CATARACT EXTRACTION W/ INTRAOCULAR LENS  IMPLANT, BILATERAL Bilateral 2011  .  COLONOSCOPY    . JOINT REPLACEMENT    . SHOULDER ARTHROSCOPY DISTAL CLAVICLE EXCISION AND OPEN ROTATOR CUFF REPAIR Right 11/2013  . TOENAIL EXCISION Right 04/2018   "big toe"  . TONSILLECTOMY  1950's  . TOTAL KNEE ARTHROPLASTY Left 10/13/2014   Procedure: LEFT TOTAL KNEE ARTHROPLASTY;  Surgeon: Mcarthur Rossetti, MD;  Location: [facility];  Service: Orthopedics;  Laterality: Left;  . TOTAL KNEE ARTHROPLASTY Right 12/10/2018   Procedure: RIGHT TOTAL KNEE ARTHROPLASTY;  Surgeon: Mcarthur Rossetti, MD;  Location: [facility];  Service: Orthopedics;  Laterality: Right;    There were no vitals filed for this visit.  Subjective Assessment - 02/26/20 1450    Subjective  Apologetic for missing last appointment- her mother is in the hospital in [facility]. Has not been able to perform her HEP as a result. Twisted her ankle and feels like she is hobbling.    Pertinent History  DMII, anemia, a-fib, migraines, HLT, HLD, GERD, depression, anxiety, R achilles tendon surgery, B carpal tunnel release, R RTC repair 2015, B TKA    Diagnostic tests  01/28/20 R elbow: no acute findings.  Some minimal arthritic changes about the elbow.  Elbow is well located.  No bony abnormalities otherwise.  Patient Stated Goals  "have better use of her shoulder and arm"    Currently in Pain?  Yes    Pain Score  3     Pain Location  Shoulder    Pain Orientation  Right    Pain Descriptors / Indicators  --   "pinch"   Pain Type  Chronic pain         [facility] PT Assessment - 02/26/20 0001      AROM   Right Forearm Supination  65 Degrees                    [facility] Adult PT Treatment/Exercise - 02/26/20 0001      Elbow Exercises   Forearm Supination  Right;10 reps;Strengthening;Bar weights/barbell    Forearm Supination Limitations  10x10" hold with mid grip on hammer      Shoulder Exercises: Standing   Row  Strengthening;Both;10 reps;Theraband    Theraband Level (Shoulder Row)  Level 2 (Red)    Row Limitations   2x10; good form      Shoulder Exercises: Pulleys   Flexion  3 minutes    Flexion Limitations  to tolerance    Scaption  3 minutes    Scaption Limitations  to tolerance       Manual Therapy   Manual Therapy  Passive ROM;Joint mobilization    Manual therapy comments  supine, sitting    Joint Mobilization  R distal radioulnar palmar jt mobs of the ulna grade III/IV to tolerance with gentle jt distraction for comfort    Soft tissue mobilization  STM to R wrist flexors, pronator teres, brachioradialis- TTP and multiple areas of soft tissue restriction    Myofascial Release  manual TPR to R pronator teres, wrist flexors, brachioradialis- TTP but able to tolerate     Passive ROM  R elbow supination PROM with prolonged holds; R shoulder PROM in all planes to tolerance;                PT Short Term Goals - 02/12/20 1535      PT SHORT TERM GOAL #1   Title  Independent with initial HEP     Time  3    Period  Weeks    Status  On-going    Target Date  02/24/20        PT Long Term Goals - 02/10/20 1457      PT LONG TERM GOAL #1   Title  Independent with advanced HEP    Time  6    Period  Weeks    Status  On-going      PT LONG TERM GOAL #2   Title  Patient to demonstrate R shoulder and elbow AROM WFL and without pain limiting.    Time  6    Period  Weeks    Status  On-going      PT LONG TERM GOAL #3   Title  Patient to demonstrate R shoulder strength >/=4+/5.    Time  6    Period  Weeks    Status  On-going      PT LONG TERM GOAL #4   Title  Patient to demonstrate overhead lift with 5lbs without pain and with good scapular mechanics.    Time  6    Period  Weeks    Status  On-going      PT LONG TERM GOAL #5   Title  Patient to report 75% improvement in ability to blow  dry her hair.    Time  6    Period  Weeks    Status  On-going            Plan - 02/26/20 1529    Clinical Impression Statement  Patient reporting poor compliance with HEP recently as her  mother is in the hospital in [facility] and she had to fly out to see her. Worked on R elbow and shoulder mobility with MT, with patient reporting "pinching" sensation with palmar joints of ulna and hypomobility evident. Patient tolerated STM and TPR to R wrist flexors, pronator teres, brachioradialis with tenderness and multiple areas of soft tissue restriction evident. Patient able to perform 65 degrees of R elbow supination after manual therapy. Proceeded with active supination ther-ex for continued improvement in ROM. Patient without complaints at end of session. Progressing well towards goals.    Comorbidities  DMII, anemia, a-fib, migraines, HLT, HLD, GERD, depression, anxiety, R achilles tendon surgery, B carpal tunnel release, R RTC repair 2015, B TKA    Rehab Potential  Good    PT Frequency  2x / week    PT Treatment/Interventions  ADLs/Self Care Home Management;Cryotherapy;Electrical Stimulation;Iontophoresis 4mg /ml Dexamethasone;Moist Heat;Therapeutic exercise;Balance training;Therapeutic activities;Functional mobility training;Ultrasound;Neuromuscular re-education;Patient/family education;Manual techniques;Vasopneumatic Device;Taping;Energy conservation;Dry needling;Passive range of motion    PT Next Visit Plan  Progress R shoulder ROM and elbow supination    Consulted and Agree with Plan of Care  Patient       Patient will benefit from skilled therapeutic intervention in order to improve the following deficits and impairments:  Hypomobility, Increased edema, Decreased activity tolerance, Decreased strength, Impaired UE functional use, Pain, Increased fascial restricitons, Increased muscle spasms, Decreased range of motion, Improper body mechanics, Postural dysfunction, Impaired flexibility  Visit Diagnosis: Chronic right shoulder pain  Stiffness of right shoulder, not elsewhere classified  Stiffness of right elbow, not elsewhere classified  Other symptoms and signs involving the  musculoskeletal system     Problem List Patient Active Problem List   Diagnosis Date Noted  . Unilateral primary osteoarthritis, right knee 12/10/2018  . Status post total right knee replacement 12/10/2018  . Long term (current) use of anticoagulants 08/16/2018  . Hypertensive cardiovascular disease 06/04/2018  . Morbid obesity with BMI of 40.0-44.9, adult ([place]) 06/04/2018  . Fatigue 06/04/2018  . Chronic anticoagulation   . PAF (paroxysmal atrial fibrillation) ([place]) 05/02/2018  . Primary osteoarthritis of right knee 06/14/2017  . OSA (obstructive sleep apnea) 02/13/2017  . NIDDM-type 2 10/31/2016  . Essential hypertension 08/02/2016  . Anxiety and depression 08/02/2016  . Insomnia 08/02/2016  . Arthritis of left knee 10/13/2014  . Status post total left knee replacement 10/13/2014     Janene Harvey, PT, DPT 02/26/20 3:32 PM   [facility] [address]  [place], [place], [address] Phone: [phone]   Fax:  [phone]  Name: Payson Madar MRN: [id number] Date of Birth: 06-28-1951

## 2020-03-02 ENCOUNTER — Ambulatory Visit: Payer: Medicare Other | Attending: Physician Assistant

## 2020-03-02 ENCOUNTER — Other Ambulatory Visit: Payer: Self-pay

## 2020-03-02 DIAGNOSIS — M25621 Stiffness of right elbow, not elsewhere classified: Secondary | ICD-10-CM | POA: Diagnosis present

## 2020-03-02 DIAGNOSIS — M25611 Stiffness of right shoulder, not elsewhere classified: Secondary | ICD-10-CM | POA: Diagnosis present

## 2020-03-02 DIAGNOSIS — G8929 Other chronic pain: Secondary | ICD-10-CM | POA: Diagnosis present

## 2020-03-02 DIAGNOSIS — M25511 Pain in right shoulder: Secondary | ICD-10-CM | POA: Diagnosis present

## 2020-03-02 DIAGNOSIS — R29898 Other symptoms and signs involving the musculoskeletal system: Secondary | ICD-10-CM | POA: Diagnosis present

## 2020-03-02 DIAGNOSIS — R600 Localized edema: Secondary | ICD-10-CM | POA: Diagnosis present

## 2020-03-02 NOTE — Therapy (Signed)
[facility] [address]  [place], [place], [address] Phone: [phone]   Fax:  [phone]  Physical Therapy Treatment  Patient Details  Name: Debra Wright MRN: [id number] Date of Birth: 08-21-1951 Referring Provider (PT): Erskine Emery, PA-C   Encounter Date: 03/02/2020  PT End of Session - 03/02/20 1454    Visit Number  6    Number of Visits  13    Date for PT Re-Evaluation  03/16/20    Authorization Type  Medicare & AARP    PT Start Time  5929    PT Stop Time  1527    PT Time Calculation (min)  38 min    Activity Tolerance  Patient tolerated treatment well    Behavior During Therapy  [facility] for tasks assessed/performed       Past Medical History:  Diagnosis Date  . Anxiety   . Arthritis    "knees; right thumb" (10/14/2014)  . Basal cell carcinoma    "burned off upper lip & right shoulder"  . Chronic anticoagulation    CHADS VASC=3  . Complication of anesthesia    after spinal anesthesia for left knee replacement, bladder did "not wake up". Was incontinent for 4 days post surgery  . Depression   . Fibroid tumor   . Frequent diarrhea   . GERD (gastroesophageal reflux disease)   . High cholesterol   . Hypertension    EF 65-70% grade 2DD  . Insomnia   . Migraine    "frequently when I was younger; maybe monthly now" (10/14/2014) - none after hysterectomy  . PAF (paroxysmal atrial fibrillation) ([place]) 05/02/2018   converted with rate control  . Pernicious anemia    pt not aware of this  . Sleep apnea    uses a cpap  . Type II diabetes mellitus ([place])   . Vitamin B 12 deficiency     Past Surgical History:  Procedure Laterality Date  . ABDOMINAL HYSTERECTOMY  1988  . ACHILLES TENDON SURGERY Right   . APPENDECTOMY  1988  . BILATERAL OOPHORECTOMY  2004  . CARPAL TUNNEL RELEASE Bilateral 1986  . CATARACT EXTRACTION W/ INTRAOCULAR LENS  IMPLANT, BILATERAL Bilateral 2011  . COLONOSCOPY    . JOINT  REPLACEMENT    . SHOULDER ARTHROSCOPY DISTAL CLAVICLE EXCISION AND OPEN ROTATOR CUFF REPAIR Right 11/2013  . TOENAIL EXCISION Right 04/2018   "big toe"  . TONSILLECTOMY  1950's  . TOTAL KNEE ARTHROPLASTY Left 10/13/2014   Procedure: LEFT TOTAL KNEE ARTHROPLASTY;  Surgeon: Mcarthur Rossetti, MD;  Location: [facility];  Service: Orthopedics;  Laterality: Left;  . TOTAL KNEE ARTHROPLASTY Right 12/10/2018   Procedure: RIGHT TOTAL KNEE ARTHROPLASTY;  Surgeon: Mcarthur Rossetti, MD;  Location: [facility];  Service: Orthopedics;  Laterality: Right;    There were no vitals filed for this visit.  Subjective Assessment - 03/02/20 1452    Subjective  Pt. doing ok.    Pertinent History  DMII, anemia, a-fib, migraines, HLT, HLD, GERD, depression, anxiety, R achilles tendon surgery, B carpal tunnel release, R RTC repair 2015, B TKA    Diagnostic tests  01/28/20 R elbow: no acute findings.  Some minimal arthritic changes about the elbow.  Elbow is well located.  No bony abnormalities otherwise.    Patient Stated Goals  "have better use of her shoulder and arm"    Currently in Pain?  Yes    Pain Score  1  Pain Location  Shoulder    Pain Orientation  Right    Pain Descriptors / Indicators  Aching    Pain Type  Chronic pain    Pain Radiating Towards  down the R forearm                        [facility] Adult PT Treatment/Exercise - 03/02/20 0001      Elbow Exercises   Other elbow exercises  R forearm supination stretch with L UE assistance seated 2 x 30 sec       Shoulder Exercises: Sidelying   External Rotation  Right;10 reps;Strengthening    External Rotation Weight (lbs)  1    External Rotation Limitations  elbow staying at side      Shoulder Exercises: Pulleys   Flexion  3 minutes    Flexion Limitations  to tolerance    Scaption  3 minutes    Scaption Limitations  to tolerance       Shoulder Exercises: Stretch   Corner Stretch  1 rep;30 seconds    Corner Stretch  Limitations  low in doorframe       Wrist Exercises   Wrist Flexion  Right;10 reps;Strengthening    Bar Weights/Barbell (Wrist Flexion)  2 lbs    Wrist Extension  Right;10 reps;Strengthening;Bar weights/barbell    Bar Weights/Barbell (Wrist Extension)  2 lbs    Forearm Supination  Right;10 reps;Strengthening;Bar weights/barbell   5" hold    Forearm Supination Limitations  Stanley hammer mid grip     Forearm Pronation  Right;10 reps;Strengthening;Bar weights/barbell;Seated    Forearm Pronation Limitations  Stanley hammer mid grip                PT Short Term Goals - 03/02/20 1457      PT SHORT TERM GOAL #1   Title  Independent with initial HEP     Time  3    Period  Weeks    Status  Achieved    Target Date  02/24/20        PT Long Term Goals - 02/10/20 1457      PT LONG TERM GOAL #1   Title  Independent with advanced HEP    Time  6    Period  Weeks    Status  On-going      PT LONG TERM GOAL #2   Title  Patient to demonstrate R shoulder and elbow AROM WFL and without pain limiting.    Time  6    Period  Weeks    Status  On-going      PT LONG TERM GOAL #3   Title  Patient to demonstrate R shoulder strength >/=4+/5.    Time  6    Period  Weeks    Status  On-going      PT LONG TERM GOAL #4   Title  Patient to demonstrate overhead lift with 5lbs without pain and with good scapular mechanics.    Time  6    Period  Weeks    Status  On-going      PT LONG TERM GOAL #5   Title  Patient to report 75% improvement in ability to blow dry her hair.    Time  6    Period  Weeks    Status  On-going            Plan - 03/02/20 1455    Clinical Impression Statement  Pt. reporting improved  adherence to HEP. Denies questions regarding HEP.  STG #1 met.  Tolerated mild progression of forearm/wrist strengthening activities today well.  Pt. does feel her ROM has improved since starting therapy.  Ended visit pain free.    Comorbidities  DMII, anemia, a-fib, migraines,  HLT, HLD, GERD, depression, anxiety, R achilles tendon surgery, B carpal tunnel release, R RTC repair 2015, B TKA    Rehab Potential  Good    PT Frequency  2x / week    PT Treatment/Interventions  ADLs/Self Care Home Management;Cryotherapy;Electrical Stimulation;Iontophoresis 30m/ml Dexamethasone;Moist Heat;Therapeutic exercise;Balance training;Therapeutic activities;Functional mobility training;Ultrasound;Neuromuscular re-education;Patient/family education;Manual techniques;Vasopneumatic Device;Taping;Energy conservation;Dry needling;Passive range of motion    PT Next Visit Plan  Progress R shoulder ROM and elbow supination    Consulted and Agree with Plan of Care  Patient       Patient will benefit from skilled therapeutic intervention in order to improve the following deficits and impairments:  Hypomobility, Increased edema, Decreased activity tolerance, Decreased strength, Impaired UE functional use, Pain, Increased fascial restricitons, Increased muscle spasms, Decreased range of motion, Improper body mechanics, Postural dysfunction, Impaired flexibility  Visit Diagnosis: Chronic right shoulder pain  Stiffness of right shoulder, not elsewhere classified  Stiffness of right elbow, not elsewhere classified  Other symptoms and signs involving the musculoskeletal system     Problem List Patient Active Problem List   Diagnosis Date Noted  . Unilateral primary osteoarthritis, right knee 12/10/2018  . Status post total right knee replacement 12/10/2018  . Long term (current) use of anticoagulants 08/16/2018  . Hypertensive cardiovascular disease 06/04/2018  . Morbid obesity with BMI of 40.0-44.9, adult ([place] 06/04/2018  . Fatigue 06/04/2018  . Chronic anticoagulation   . PAF (paroxysmal atrial fibrillation) ([place] 05/02/2018  . Primary osteoarthritis of right knee 06/14/2017  . OSA (obstructive sleep apnea) 02/13/2017  . NIDDM-type 2 10/31/2016  . Essential hypertension 08/02/2016   . Anxiety and depression 08/02/2016  . Insomnia 08/02/2016  . Arthritis of left knee 10/13/2014  . Status post total left knee replacement 10/13/2014    MBess Harvest PTA 03/02/20 5:43 PM   [facility] [address] [place] [address]Phone: [phone]  Fax:  [phone] Name: Debra ChaidezMRN: [id number] of Birth: [birth year]

## 2020-03-04 ENCOUNTER — Ambulatory Visit: Payer: Medicare Other | Admitting: Physical Therapy

## 2020-03-04 ENCOUNTER — Encounter: Payer: Self-pay | Admitting: Physical Therapy

## 2020-03-04 ENCOUNTER — Encounter: Payer: Medicare Other | Admitting: Physical Therapy

## 2020-03-04 ENCOUNTER — Other Ambulatory Visit: Payer: Self-pay

## 2020-03-04 DIAGNOSIS — R600 Localized edema: Secondary | ICD-10-CM

## 2020-03-04 DIAGNOSIS — M25621 Stiffness of right elbow, not elsewhere classified: Secondary | ICD-10-CM

## 2020-03-04 DIAGNOSIS — R29898 Other symptoms and signs involving the musculoskeletal system: Secondary | ICD-10-CM

## 2020-03-04 DIAGNOSIS — M25611 Stiffness of right shoulder, not elsewhere classified: Secondary | ICD-10-CM

## 2020-03-04 DIAGNOSIS — M25511 Pain in right shoulder: Secondary | ICD-10-CM | POA: Diagnosis not present

## 2020-03-04 DIAGNOSIS — G8929 Other chronic pain: Secondary | ICD-10-CM

## 2020-03-04 NOTE — Therapy (Signed)
[facility] [address]  [place], [place], [address] Phone: [phone]   Fax:  [phone]  Physical Therapy Treatment  Patient Details  Name: Debra Wright MRN: [id number] Date of Birth: [birth year] Referring Provider (PT): Erskine Emery, PA-C   Encounter Date: 03/04/2020  PT End of Session - 03/04/20 1529    Visit Number  7    Number of Visits  13    Date for PT Re-Evaluation  03/16/20    Authorization Type  Medicare & AARP    PT Start Time  [id number]    PT Stop Time  1528    PT Time Calculation (min)  34 min    Activity Tolerance  Patient tolerated treatment well    Behavior During Therapy  [facility] for tasks assessed/performed       Past Medical History:  Diagnosis Date  . Anxiety   . Arthritis    "knees; right thumb" (10/14/2014)  . Basal cell carcinoma    "burned off upper lip & right shoulder"  . Chronic anticoagulation    CHADS VASC=3  . Complication of anesthesia    after spinal anesthesia for left knee replacement, bladder did "not wake up". Was incontinent for 4 days post surgery  . Depression   . Fibroid tumor   . Frequent diarrhea   . GERD (gastroesophageal reflux disease)   . High cholesterol   . Hypertension    EF 65-70% grade 2DD  . Insomnia   . Migraine    "frequently when I was younger; maybe monthly now" (10/14/2014) - none after hysterectomy  . PAF (paroxysmal atrial fibrillation) ([place]) 05/02/2018   converted with rate control  . Pernicious anemia    pt not aware of this  . Sleep apnea    uses a cpap  . Type II diabetes mellitus ([place])   . Vitamin B 12 deficiency     Past Surgical History:  Procedure Laterality Date  . ABDOMINAL HYSTERECTOMY  1988  . ACHILLES TENDON SURGERY Right   . APPENDECTOMY  1988  . BILATERAL OOPHORECTOMY  2004  . CARPAL TUNNEL RELEASE Bilateral 1986  . CATARACT EXTRACTION W/ INTRAOCULAR LENS  IMPLANT, BILATERAL Bilateral 2011  . COLONOSCOPY    . JOINT  REPLACEMENT    . SHOULDER ARTHROSCOPY DISTAL CLAVICLE EXCISION AND OPEN ROTATOR CUFF REPAIR Right 11/2013  . TOENAIL EXCISION Right 04/2018   "big toe"  . TONSILLECTOMY  1950's  . TOTAL KNEE ARTHROPLASTY Left 10/13/2014   Procedure: LEFT TOTAL KNEE ARTHROPLASTY;  Surgeon: Mcarthur Rossetti, MD;  Location: [facility];  Service: Orthopedics;  Laterality: Left;  . TOTAL KNEE ARTHROPLASTY Right 12/10/2018   Procedure: RIGHT TOTAL KNEE ARTHROPLASTY;  Surgeon: Mcarthur Rossetti, MD;  Location: [facility];  Service: Orthopedics;  Laterality: Right;    There were no vitals filed for this visit.  Subjective Assessment - 03/04/20 1455    Subjective  Mother will be going into hospice soon. Felt fine after last session.    Pertinent History  DMII, anemia, a-fib, migraines, HLT, HLD, GERD, depression, anxiety, R achilles tendon surgery, B carpal tunnel release, R RTC repair 2015, B TKA    Diagnostic tests  01/28/20 R elbow: no acute findings.  Some minimal arthritic changes about the elbow.  Elbow is well located.  No bony abnormalities otherwise.    Patient Stated Goals  "have better use of her shoulder and arm"    Currently in Pain?  No/denies  [facility] Adult PT Treatment/Exercise - 03/04/20 0001      Elbow Exercises   Elbow Flexion  Right;10 reps;Bar weights/barbell;Strengthening;Seated    Theraband Level (Elbow Flexion)  Level 2 (Red)    Elbow Flexion Limitations  cues to maintain end range supination    Elbow Extension  Strengthening;Right;10 reps;Standing    Theraband Level (Elbow Extension)  Level 2 (Red)    Elbow Extension Limitations  triceps extension      Shoulder Exercises: Seated   External Rotation  AAROM;Right;10 reps    External Rotation Limitations  with wand and towel under elbow      Shoulder Exercises: Sidelying   External Rotation  Right;10 reps;Strengthening    External Rotation Weight (lbs)  2    External Rotation Limitations  2x10;  towel roll under elbow      Shoulder Exercises: Pulleys   Flexion  3 minutes    Flexion Limitations  to tolerance    Scaption  3 minutes    Scaption Limitations  to tolerance       Manual Therapy   Manual Therapy  Passive ROM;Soft tissue mobilization;Myofascial release    Manual therapy comments  supine    Joint Mobilization  R distal ulnar palmar jt mobs and proximal radial dorsal mobs grade III/IV to tolerance     Soft tissue mobilization  STM to R lateral deltoid- very TTP    Myofascial Release  manual TPR to R lateral deltoid- very TTP    Passive ROM  R shoulder ER and elbow supination to tolerance with prolonged holds             PT Education - 03/04/20 1529    Education provided  Yes    Education Details  update to [employer]) Educated  Patient    Methods  Explanation;Demonstration;Tactile cues;Verbal cues;Handout    Comprehension  Verbalized understanding;Returned demonstration       PT Short Term Goals - 03/02/20 1457      PT SHORT TERM GOAL #1   Title  Independent with initial HEP     Time  3    Period  Weeks    Status  Achieved    Target Date  02/24/20        PT Long Term Goals - 02/10/20 1457      PT LONG TERM GOAL #1   Title  Independent with advanced HEP    Time  6    Period  Weeks    Status  On-going      PT LONG TERM GOAL #2   Title  Patient to demonstrate R shoulder and elbow AROM WFL and without pain limiting.    Time  6    Period  Weeks    Status  On-going      PT LONG TERM GOAL #3   Title  Patient to demonstrate R shoulder strength >/=4+/5.    Time  6    Period  Weeks    Status  On-going      PT LONG TERM GOAL #4   Title  Patient to demonstrate overhead lift with 5lbs without pain and with good scapular mechanics.    Time  6    Period  Weeks    Status  On-going      PT LONG TERM GOAL #5   Title  Patient to report 75% improvement in ability to blow dry her hair.    Time  6    Period  Weeks  Status  On-going             Plan - 03/04/20 1620    Clinical Impression Statement  Patient looking upset at beginning of session today d/t mother going into hospice. Patient without new shoulder/elbow complaints. Tolerated manual therapy for improvement in R shoulder and elbow ROM, particularly focusing on ER and supination. Patient tolerated this without excessive pain. Patient did demonstrate weakness into ER with light weighted resistance. Able to perform resisted elbow flexion and extension without complaints. Patient still with tendency to fall into slight pronation with biceps curls. Updated HEP with exercises that were-well tolerated today. Patient reported no complaints at end of session.    Comorbidities  DMII, anemia, a-fib, migraines, HLT, HLD, GERD, depression, anxiety, R achilles tendon surgery, B carpal tunnel release, R RTC repair 2015, B TKA    Rehab Potential  Good    PT Frequency  2x / week    PT Treatment/Interventions  ADLs/Self Care Home Management;Cryotherapy;Electrical Stimulation;Iontophoresis 4mg /ml Dexamethasone;Moist Heat;Therapeutic exercise;Balance training;Therapeutic activities;Functional mobility training;Ultrasound;Neuromuscular re-education;Patient/family education;Manual techniques;Vasopneumatic Device;Taping;Energy conservation;Dry needling;Passive range of motion    PT Next Visit Plan  Progress R shoulder ROM and elbow supination    Consulted and Agree with Plan of Care  Patient       Patient will benefit from skilled therapeutic intervention in order to improve the following deficits and impairments:  Hypomobility, Increased edema, Decreased activity tolerance, Decreased strength, Impaired UE functional use, Pain, Increased fascial restricitons, Increased muscle spasms, Decreased range of motion, Improper body mechanics, Postural dysfunction, Impaired flexibility  Visit Diagnosis: Chronic right shoulder pain  Stiffness of right shoulder, not elsewhere classified  Stiffness  of right elbow, not elsewhere classified  Other symptoms and signs involving the musculoskeletal system  Localized edema     Problem List Patient Active Problem List   Diagnosis Date Noted  . Unilateral primary osteoarthritis, right knee 12/10/2018  . Status post total right knee replacement 12/10/2018  . Long term (current) use of anticoagulants 08/16/2018  . Hypertensive cardiovascular disease 06/04/2018  . Morbid obesity with BMI of 40.0-44.9, adult ([place]) 06/04/2018  . Fatigue 06/04/2018  . Chronic anticoagulation   . PAF (paroxysmal atrial fibrillation) ([place]) 05/02/2018  . Primary osteoarthritis of right knee 06/14/2017  . OSA (obstructive sleep apnea) 02/13/2017  . NIDDM-type 2 10/31/2016  . Essential hypertension 08/02/2016  . Anxiety and depression 08/02/2016  . Insomnia 08/02/2016  . Arthritis of left knee 10/13/2014  . Status post total left knee replacement 10/13/2014     Janene Harvey, PT, DPT 03/04/20 4:24 PM   [facility] [address]  [place], [place], [address] Phone: [phone]   Fax:  [phone]  Name: Debra Wright MRN: [id number] Date of Birth: 1951/08/11

## 2020-03-05 ENCOUNTER — Encounter: Payer: Self-pay | Admitting: Cardiovascular Disease

## 2020-03-05 ENCOUNTER — Telehealth: Payer: Self-pay | Admitting: Cardiovascular Disease

## 2020-03-05 ENCOUNTER — Telehealth (INDEPENDENT_AMBULATORY_CARE_PROVIDER_SITE_OTHER): Payer: Medicare Other | Admitting: Cardiovascular Disease

## 2020-03-05 VITALS — BP 114/54 | HR 52

## 2020-03-05 DIAGNOSIS — I480 Paroxysmal atrial fibrillation: Secondary | ICD-10-CM

## 2020-03-05 DIAGNOSIS — Z79899 Other long term (current) drug therapy: Secondary | ICD-10-CM | POA: Diagnosis not present

## 2020-03-05 DIAGNOSIS — Z6841 Body Mass Index (BMI) 40.0 and over, adult: Secondary | ICD-10-CM | POA: Diagnosis not present

## 2020-03-05 DIAGNOSIS — I10 Essential (primary) hypertension: Secondary | ICD-10-CM

## 2020-03-05 DIAGNOSIS — Z7901 Long term (current) use of anticoagulants: Secondary | ICD-10-CM

## 2020-03-05 DIAGNOSIS — I5189 Other ill-defined heart diseases: Secondary | ICD-10-CM

## 2020-03-05 DIAGNOSIS — I519 Heart disease, unspecified: Secondary | ICD-10-CM | POA: Diagnosis not present

## 2020-03-05 DIAGNOSIS — G4733 Obstructive sleep apnea (adult) (pediatric): Secondary | ICD-10-CM | POA: Diagnosis not present

## 2020-03-05 NOTE — Patient Instructions (Addendum)
Medication Instructions:  CONTINUE WITH CURRENT MEDICATIONS. NO CHANGES.  *If you need a refill on your cardiac medications before your next appointment, please call your pharmacy*  Follow-Up: At [facility], you and your health needs are our priority.  As part of our continuing mission to provide you with exceptional heart care, we have created designated Provider Care Teams.  These Care Teams include your primary Cardiologist (physician) and Advanced Practice Providers (APPs -  Physician Assistants and Nurse Practitioners) who all work together to provide you with the care you need, when you need it.  We recommend signing up for the patient portal called "MyChart".  Sign up information is provided on this After Visit Summary.  MyChart is used to connect with patients for Virtual Visits (Telemedicine).  Patients are able to view lab/test results, encounter notes, upcoming appointments, etc.  Non-urgent messages can be sent to your provider as well.   To learn more about what you can do with MyChart, go to [email or web address].    Your next appointment:   12 month(s)  The format for your next appointment:   In Person  Provider:   Shelva Majestic, MD   He in lab 9 Ennever left some still dictating charts that patient signed hearing aid right he has had an in lab 9 dictating charts I never left the okay I mean that I am in the dictating area I by by

## 2020-03-05 NOTE — Telephone Encounter (Signed)
Called Dr.Kelly's nurse- advised that patient was calling back in for her appointment today at 4.  Nurse will contact patient to get set up.

## 2020-03-05 NOTE — Telephone Encounter (Signed)
Patient called and stated that her appointment was at 4pm, and that she had missed the call. Attempted to contact nurse.

## 2020-03-05 NOTE — Progress Notes (Signed)
Virtual Visit via Telephone Note   This visit type was conducted due to national recommendations for restrictions regarding the COVID-19 Pandemic (e.g. social distancing) in an effort to limit this patient's exposure and mitigate transmission in our community.  Due to her co-morbid illnesses, this patient is at least at moderate risk for complications without adequate follow up.  This format is felt to be most appropriate for this patient at this time.  The patient did not have access to video technology/had technical difficulties with video requiring transitioning to audio format only (telephone).  All issues noted in this document were discussed and addressed.  No physical exam could be performed with this format.  Please refer to the patient's chart for her  consent to telehealth for [facility].   The patient was identified using 2 identifiers.  Date:  03/05/2020   ID:  Debra, Wright 03-25-1951, MRN [id number]  Patient Location: Home Provider Location: Office  PCP:  Eunice Blase, MD  Cardiologist:  Shelva Majestic, MD  Electrophysiologist:  None   Evaluation Performed:  Follow-Up Visit  Chief Complaint: 56-month follow-up  History of Present Illness:    Debra Wright is a 69 y.o. female who was seen by me in the hospital in August 2019 when she presented with new onset atrial fibrillation and reverted to sinus rhythm in the emergency room after multiple rounds of IV metoprolol.  She has a history of hypertension.  She had mild chest tightness associated with a rapid rate, troponins were negative and she had nonspecific ST-T changes.  I have not seen her since that initial hospital assessment.  The patient was evaluated by Kerin Ransom, PA-C on January 27, 2019 in a telemedicine conference.  She has a history of hypertension, type 2 diabetes mellitus, hyperlipidemia, anxiety, and has sleep apnea which is followed by Dr. Elsworth Soho.  During her 2019 hospitalization, she converted to  sinus rhythm with IV metoprolol and oral metoprolol was added to her regimen.  Amlodipine was changed to diltiazem.  An echo Doppler in the hospital demonstrated normal LV function with moderate LVH, grade 2 diastolic dysfunction, aortic valve sclerosis without stenosis, and mild to moderate left atrial enlargement.  Subsequently, her metoprolol dose had been reduced due to fatigability.  She had undergone total knee replacement in March 2020 completed physical therapy.  I saw her for my initial office evaluation with me on September 09, 2019.  At that time she denied any awareness of recurrent atrial fibrillation.  Since her knee surgery she had a 20 pound weight loss but subsequently admits that she has regained approximately 12 pounds back.  She has had recent blood pressure lability.  She continued to use CPAP.   The patient does not have symptoms concerning for COVID-19 infection (fever, chills, cough, or new shortness of breath).    Past Medical History:  Diagnosis Date  . Anxiety   . Arthritis    "knees; right thumb" (10/14/2014)  . Basal cell carcinoma    "burned off upper lip & right shoulder"  . Chronic anticoagulation    CHADS VASC=3  . Complication of anesthesia    after spinal anesthesia for left knee replacement, bladder did "not wake up". Was incontinent for 4 days post surgery  . Depression   . Fibroid tumor   . Frequent diarrhea   . GERD (gastroesophageal reflux disease)   . High cholesterol   . Hypertension    EF 65-70% grade 2DD  . Insomnia   .  Migraine    "frequently when I was younger; maybe monthly now" (10/14/2014) - none after hysterectomy  . PAF (paroxysmal atrial fibrillation) ([place]) 05/02/2018   converted with rate control  . Pernicious anemia    pt not aware of this  . Sleep apnea    uses a cpap  . Type II diabetes mellitus ([place])   . Vitamin B 12 deficiency    Past Surgical History:  Procedure Laterality Date  . ABDOMINAL HYSTERECTOMY  1988  . ACHILLES  TENDON SURGERY Right   . APPENDECTOMY  1988  . BILATERAL OOPHORECTOMY  2004  . CARPAL TUNNEL RELEASE Bilateral 1986  . CATARACT EXTRACTION W/ INTRAOCULAR LENS  IMPLANT, BILATERAL Bilateral 2011  . COLONOSCOPY    . JOINT REPLACEMENT    . SHOULDER ARTHROSCOPY DISTAL CLAVICLE EXCISION AND OPEN ROTATOR CUFF REPAIR Right 11/2013  . TOENAIL EXCISION Right 04/2018   "big toe"  . TONSILLECTOMY  1950's  . TOTAL KNEE ARTHROPLASTY Left 10/13/2014   Procedure: LEFT TOTAL KNEE ARTHROPLASTY;  Surgeon: Mcarthur Rossetti, MD;  Location: [facility];  Service: Orthopedics;  Laterality: Left;  . TOTAL KNEE ARTHROPLASTY Right 12/10/2018   Procedure: RIGHT TOTAL KNEE ARTHROPLASTY;  Surgeon: Mcarthur Rossetti, MD;  Location: [facility];  Service: Orthopedics;  Laterality: Right;     Current Meds  Medication Sig  . calcium carbonate (TUMS - DOSED IN MG ELEMENTAL CALCIUM) 500 MG chewable tablet Chew 2 tablets by mouth daily as needed for indigestion or heartburn.  . Calcium Carbonate-Vitamin D (CALCIUM 600 + D PO) Take 1 tablet by mouth daily.  Marland Kitchen CARTIA XT 180 MG 24 hr capsule Take 1 capsule by mouth once daily  . CINNAMON PO Take 1 tablet by mouth daily.  . citalopram (CELEXA) 40 MG tablet TAKE 1 TABLET BY MOUTH EVERY DAY  . clonazePAM (KLONOPIN) 0.5 MG tablet TAKE 1/2 - 1 TABLET BY MOUTH AT BEDTIME AS NEEDED  . Eszopiclone 3 MG TABS Take 1 tablet (3 mg total) by mouth at bedtime as needed. Take immediately before bedtime  . glipiZIDE (GLUCOTROL XL) 5 MG 24 hr tablet Take 1 tablet (5 mg total) by mouth daily with breakfast. TAKE 1 TABLET BY MOUTH ONCE DAILY WITH BREAKFAST  . magnesium oxide (MAG-OX) 400 (241.3 Mg) MG tablet Take 400 mg by mouth daily.  . metFORMIN (GLUCOPHAGE) 1000 MG tablet Take 1 tablet (1,000 mg total) by mouth 2 (two) times daily with a meal.  . metoprolol tartrate (LOPRESSOR) 25 MG tablet Take a half tablet daily.  Marland Kitchen nystatin cream (MYCOSTATIN) Apply 1 application topically 2 (two) times  daily.  . Omega-3 Fatty Acids (OMEGA-3 FISH OIL) 1200 MG CAPS Take 2,400 mg by mouth daily.   Marland Kitchen omeprazole (PRILOSEC) 40 MG capsule Take 1 capsule (40 mg total) by mouth every morning.  . silver sulfADIAZINE (SILVADENE) 1 % cream Apply 1 application topically daily.  Marland Kitchen tiZANidine (ZANAFLEX) 2 MG tablet Take 1-2 tablets (2-4 mg total) by mouth every 6 (six) hours as needed for muscle spasms.  . vitamin C (ASCORBIC ACID) 250 MG tablet Take 250 mg by mouth daily.  Marland Kitchen warfarin (COUMADIN) 5 MG tablet TAKE 1 TO 1 & 1/2 (ONE TO ONE & ONE-HALF) TABLETS BY MOUTH ONCE DAILY AS DIRECTED BY  COUMADIN  CLINIC     Allergies:   Antivert [meclizine hcl]     Social History   Tobacco Use  . Smoking status: Never Smoker  . Smokeless tobacco: Never Used  Substance Use  Topics  . Alcohol use: Yes    Comment: 10/14/2014 "might have a drink when I'm out once/month"  . Drug use: No     Family Hx: The patient's family history includes Arthritis in her father, maternal grandfather, maternal grandmother, mother, paternal grandfather, and paternal grandmother; Diabetes in her brother, father, maternal grandmother, mother, paternal grandfather, and paternal grandmother; Heart disease in her father; Hyperlipidemia in her father, maternal grandfather, maternal grandmother, mother, paternal grandfather, and paternal grandmother; Hypertension in her brother, maternal grandfather, maternal grandmother, mother, paternal grandfather, and paternal grandmother; Stroke in her brother.  ROS:   Please see the history of present illness.    No fevers chills or night sweats No chest pain or palpitations No wheezing Positive for obesity and weight gain Continues to use CPAP with excellent compliance All other systems reviewed and are negative.   Prior CV studies:   The following studies were reviewed today:  ECHO Study Conclusions: 05/03/2018   - Left ventricle: The cavity size was normal. There was moderate  concentric  hypertrophy. Systolic function was vigorous. The  estimated ejection fraction was in the range of 65% to 70%. Wall  motion was normal; there were no regional wall motion  abnormalities. Features are consistent with a pseudonormal left  ventricular filling pattern, with concomitant abnormal relaxation  and increased filling pressure (grade 2 diastolic dysfunction).  Doppler parameters are consistent with high ventricular filling  pressure.  - Aortic valve: Trileaflet; mildly thickened, mildly calcified  leaflets.  - Aorta: Aortic root dimension: 37 mm (ED).  - Mitral valve: Calcified annulus.  - Left atrium: The atrium was mildly to moderately dilated.  - Pulmonary arteries: Systolic pressure could not be accurately  estimated.   Labs/Other Tests and Data Reviewed:    EKG: No ECG was done today but I personally reviewed the ECG from September 09, 2019 which showed sinus bradycardia 54 bpm, LVH by voltage.  No ectopy.  Normal intervals.  Recent Labs: 11/26/2019: ALT 17; Hemoglobin 13.0; Platelets 246 01/15/2020: BUN 8; Creatinine, Ser 0.53; Magnesium 1.3; Potassium 4.7; Sodium 141   Recent Lipid Panel Lab Results  Component Value Date/Time   CHOL 138 08/07/2018 03:30 PM   TRIG 185 (H) 08/07/2018 03:30 PM   HDL 46 (L) 08/07/2018 03:30 PM   CHOLHDL 3.0 08/07/2018 03:30 PM   LDLCALC 66 08/07/2018 03:30 PM   LDLDIRECT 42.0 01/31/2018 09:43 AM    Wt Readings from Last 3 Encounters:  12/29/19 243 lb (110.2 kg)  12/26/19 242 lb 6.4 oz (110 kg)  12/11/19 240 lb 9.6 oz (109.1 kg)     Objective:    Vital Signs:  BP (!) 114/54   Pulse (!) 52   SpO2 99%      ASSESSMENT & PLAN:    1. PAF: No recurrence since her hospitalization in August 2019 at which timshe converted successfully with IV metoprolol to sinus rhythm. 2. Essential hypertension: Blood pressure has improved with the addition of spironolactone in December to her Cartia XT 180 mg and low-dose metoprolol  tartrate 12.5 mg twice a day 3. Diastolic dysfunction: Feeling well and denies exertional dyspnea since Spironolactone added. 4. Prior dizziness: Resolved 5. Morbid obesity: BMI greater than 40.  Discussed importance of weight loss and increased exercise 6. OSA: Admits to 100% compliance.  Followed by Dr. Elsworth Soho 7. Long-term use of anticoagulation: Currently on warfarin.  No bleeding.  In need for L5-S1 left directed epidural spinal injection tentatively scheduled for March 23, 2020 with  Dr. Maryjean Ka.  Patient was instructed to hold hold warfarin for 5 days prior to procedure. 8. History of knee replacement: Currently stable    COVID-19 Education: The signs and symptoms of COVID-19 were discussed with the patient and how to seek care for testing (follow up with PCP or arrange E-visit).  The importance of social distancing was discussed today.  Time:   Today, I have spent 21 minutes with the patient with telehealth technology discussing the above problems.     Medication Adjustments/Labs and Tests Ordered: Current medicines are reviewed at length with the patient today.  Concerns regarding medicines are outlined above.   Tests Ordered: No orders of the defined types were placed in this encounter.   Medication Changes: No orders of the defined types were placed in this encounter.   Follow Up: Follow-up in office in 1 year  Signed, Shelva Majestic, MD  03/05/2020 5:14 PM    [facility]

## 2020-03-07 ENCOUNTER — Encounter: Payer: Self-pay | Admitting: Cardiovascular Disease

## 2020-03-09 ENCOUNTER — Telehealth: Payer: Self-pay | Admitting: *Deleted

## 2020-03-09 ENCOUNTER — Ambulatory Visit: Payer: Medicare Other

## 2020-03-09 ENCOUNTER — Other Ambulatory Visit: Payer: Self-pay

## 2020-03-09 NOTE — Telephone Encounter (Signed)
   [facility] Pre-operative Risk Assessment    HEARTCARE STAFF: - Please ensure there is not already an duplicate clearance open for this procedure. - Under Visit Info/Reason for Call, type in Other and utilize the format Clearance MM/DD/YY or Clearance TBD. Do not use dashes or single digits. - If request is for dental extraction, please clarify the # of teeth to be extracted.  Request for surgical clearance:  1. What type of surgery is being performed? LEFT DIRECTED ESI L5 S1 INJECTION  2. When is this surgery scheduled?  03/23/20   3. What type of clearance is required (medical clearance vs. Pharmacy clearance to hold med vs. Both)? BOTH   4. Are there any medications that need to be held prior to surgery and how long? COUMADIN 5 DAYS PRIOR  5. Practice name and name of physician performing surgery?  [facility]  6. What is the office phone number?  [phone]   7.   What is the office fax number?  [phone]  8.   Anesthesia type (None, local, MAC, general) ? CHOICE    Devra Dopp 03/09/2020, 1:08 PM  _________________________________________________________________   (provider comments below)

## 2020-03-09 NOTE — Telephone Encounter (Addendum)
   Primary Cardiologist: Shelva Majestic, MD  Chart reviewed as part of pre-operative protocol coverage. Looks like this is a duplicate request. Per pre-op note on 02/18/2020 by Kathyrn Drown, NP, patient felt to be at acceptable risk for left directed ESI L5-S1 on 03/23/2020 without any additional cardiovascular testing. Per pharmacy at that time,  OK to hold warfarin for 5 days prior to procedure. She will NOT need bridging with Lovenox.   I will route this recommendation to the requesting party via Epic fax function and remove from pre-op pool.  Please call with questions.  Darreld Mclean, PA-C 03/09/2020, 2:00 PM

## 2020-03-11 ENCOUNTER — Ambulatory Visit: Payer: Medicare Other

## 2020-03-11 NOTE — Telephone Encounter (Signed)
FAXED AGAIN TO REQUESTING PROVIDERS OFFICE

## 2020-03-14 ENCOUNTER — Other Ambulatory Visit: Payer: Self-pay | Admitting: Cardiology

## 2020-03-16 ENCOUNTER — Encounter (INDEPENDENT_AMBULATORY_CARE_PROVIDER_SITE_OTHER): Payer: Self-pay | Admitting: Family Medicine

## 2020-03-16 ENCOUNTER — Ambulatory Visit: Payer: Medicare Other

## 2020-03-17 MED ORDER — TIZANIDINE HCL 2 MG PO TABS: 2.0000 mg | ORAL_TABLET | Freq: Four times a day (QID) | ORAL | 6 refills | Status: DC | PRN

## 2020-03-18 ENCOUNTER — Ambulatory Visit: Payer: Medicare Other | Admitting: Physical Therapy

## 2020-03-18 ENCOUNTER — Other Ambulatory Visit: Payer: Self-pay

## 2020-03-18 ENCOUNTER — Encounter: Payer: Self-pay | Admitting: Physical Therapy

## 2020-03-18 DIAGNOSIS — M25611 Stiffness of right shoulder, not elsewhere classified: Secondary | ICD-10-CM

## 2020-03-18 DIAGNOSIS — R29898 Other symptoms and signs involving the musculoskeletal system: Secondary | ICD-10-CM

## 2020-03-18 DIAGNOSIS — G8929 Other chronic pain: Secondary | ICD-10-CM

## 2020-03-18 DIAGNOSIS — M25621 Stiffness of right elbow, not elsewhere classified: Secondary | ICD-10-CM

## 2020-03-18 DIAGNOSIS — R600 Localized edema: Secondary | ICD-10-CM

## 2020-03-18 DIAGNOSIS — M25511 Pain in right shoulder: Secondary | ICD-10-CM | POA: Diagnosis not present

## 2020-03-18 MED ORDER — DILTIAZEM HCL ER COATED BEADS 180 MG PO CP24: 180.0000 mg | ORAL_CAPSULE | Freq: Every day | ORAL | 3 refills | Status: DC

## 2020-03-18 NOTE — Therapy (Addendum)
[facility] [address]  [place], [place], [address] Phone: [phone]   Fax:  [phone]  Physical Therapy Progress Note  Patient Details  Name: Debra Wright MRN: [id number] Date of Birth: 1951-09-27 Referring Provider (PT): Erskine Emery, PA-C   Progress Note Reporting Period 02/03/20 to 03/18/20  See note below for Objective Data and Assessment of Progress/Goals.     Encounter Date: 03/18/2020   PT End of Session - 03/18/20 1531    Visit Number 8    Number of Visits 20    Date for PT Re-Evaluation 04/29/20    Authorization Type Medicare & AARP    PT Start Time 2703    PT Stop Time 1525    PT Time Calculation (min) 38 min    Activity Tolerance Patient tolerated treatment well    Behavior During Therapy WFL for tasks assessed/performed           Past Medical History:  Diagnosis Date  . Anxiety   . Arthritis    "knees; right thumb" (10/14/2014)  . Basal cell carcinoma    "burned off upper lip & right shoulder"  . Chronic anticoagulation    CHADS VASC=3  . Complication of anesthesia    after spinal anesthesia for left knee replacement, bladder did "not wake up". Was incontinent for 4 days post surgery  . Depression   . Fibroid tumor   . Frequent diarrhea   . GERD (gastroesophageal reflux disease)   . High cholesterol   . Hypertension    EF 65-70% grade 2DD  . Insomnia   . Migraine    "frequently when I was younger; maybe monthly now" (10/14/2014) - none after hysterectomy  . PAF (paroxysmal atrial fibrillation) ([facility]) 05/02/2018   converted with rate control  . Pernicious anemia    pt not aware of this  . Sleep apnea    uses a cpap  . Type II diabetes mellitus ([facility])   . Vitamin B 12 deficiency     Past Surgical History:  Procedure Laterality Date  . ABDOMINAL HYSTERECTOMY  1988  . ACHILLES TENDON SURGERY Right   . APPENDECTOMY  1988  . BILATERAL OOPHORECTOMY  2004  . CARPAL TUNNEL  RELEASE Bilateral 1986  . CATARACT EXTRACTION W/ INTRAOCULAR LENS  IMPLANT, BILATERAL Bilateral 2011  . COLONOSCOPY    . JOINT REPLACEMENT    . SHOULDER ARTHROSCOPY DISTAL CLAVICLE EXCISION AND OPEN ROTATOR CUFF REPAIR Right 11/2013  . TOENAIL EXCISION Right 04/2018   "big toe"  . TONSILLECTOMY  1950's  . TOTAL KNEE ARTHROPLASTY Left 10/13/2014   Procedure: LEFT TOTAL KNEE ARTHROPLASTY;  Surgeon: Mcarthur Rossetti, MD;  Location: [facility];  Service: Orthopedics;  Laterality: Left;  . TOTAL KNEE ARTHROPLASTY Right 12/10/2018   Procedure: RIGHT TOTAL KNEE ARTHROPLASTY;  Surgeon: Mcarthur Rossetti, MD;  Location: [facility];  Service: Orthopedics;  Laterality: Right;    There were no vitals filed for this visit.   Subjective Assessment - 03/18/20 1448    Subjective Has not been doing her exercises for the past week as she has been in [place] d/t her mother passing away.    Pertinent History DMII, anemia, a-fib, migraines, HLT, HLD, GERD, depression, anxiety, R achilles tendon surgery, B carpal tunnel release, R RTC repair 2015, B TKA    Diagnostic tests 01/28/20 R elbow: no acute findings.  Some minimal arthritic changes about the elbow.  Elbow is well located.  No bony  abnormalities otherwise.    Patient Stated Goals "have better use of her shoulder and arm"    Currently in Pain? Yes    Pain Score 2     Pain Location Elbow    Pain Orientation Right    Pain Descriptors / Indicators Aching    Pain Type Chronic pain              [facility] PT Assessment - 03/18/20 0001      Assessment   Medical Diagnosis Impingement of R shoulder, pain in R elbow    Referring Provider (PT) Erskine Emery, PA-C    Onset Date/Surgical Date 08/06/19      AROM   Right Shoulder Flexion 158 Degrees    Right Shoulder ABduction 139 Degrees   mild discomfort   Right Shoulder Internal Rotation --   FIR T10; pain   Right Shoulder External Rotation --   FER to back of head; pain   Right Elbow Flexion 134     Right Elbow Extension 11    Right Forearm Pronation 82 Degrees    Right Forearm Supination 60 Degrees      Strength   Right Shoulder Flexion 4+/5    Right Shoulder ABduction 4+/5    Right Shoulder Internal Rotation 4+/5    Right Shoulder External Rotation 4/5   difficulty getting into test position                        [facility] Adult PT Treatment/Exercise - 03/18/20 0001      Shoulder Exercises: Standing   Other Standing Exercises R shoulder overhead lift to 2nd shelf with 1# and 2#- with mild pain and difficulty reaching       Shoulder Exercises: Pulleys   Flexion 3 minutes    Flexion Limitations to tolerance    Scaption 3 minutes    Scaption Limitations to tolerance       Shoulder Exercises: Stretch   External Rotation Stretch --   R shoulder at dooorway 3x20"                 PT Education - 03/18/20 1530    Education provided Yes    Education Details update to HEP; discussion on objective progress and remaining impairments; edu on use of pulley for improved shoulder ROM    Person(s) Educated Patient    Methods Explanation;Demonstration;Tactile cues;Verbal cues;Handout    Comprehension Verbalized understanding;Returned demonstration            PT Short Term Goals - 03/18/20 1452      PT SHORT TERM GOAL #1   Title Independent with initial HEP     Time 3    Period Weeks    Status Achieved    Target Date 02/24/20             PT Long Term Goals - 03/18/20 1452      PT LONG TERM GOAL #1   Title Independent with advanced HEP    Time 6    Period Weeks    Status Partially Met   limited compliance d/t her mother passing away   Target Date 04/29/20      PT LONG TERM GOAL #2   Title Patient to demonstrate R shoulder and elbow AROM WFL and without pain limiting.    Time 6    Period Weeks    Status On-going    Target Date 04/29/20      PT LONG TERM GOAL #  3   Title Patient to demonstrate R shoulder strength >/=4+/5.    Time 6    Period  Weeks    Status Partially Met    Target Date 04/29/20      PT LONG TERM GOAL #4   Title Patient to demonstrate overhead lift with 5lbs without pain and with good scapular mechanics.    Time 6    Period Weeks    Status On-going   able to perform with 1 and 2 lbs but with pain   Target Date 04/29/20      PT LONG TERM GOAL #5   Title Patient to report 75% improvement in ability to blow dry her hair.    Time 6    Period Weeks    Status Partially Met   reports 45% improvement with some remaining discomfort in the arm   Target Date 04/29/20                 Plan - 03/18/20 1809    Clinical Impression Statement Patient notes that she has not been doing her exercises for the past week as she has been in [place] d/t her mother passing away. Notes 45% improvement in ability to blow-dry her hair but still has discomfort in the R arm with this activity. Patient has demonstrated improvement in R shoulder flexion, abduction, and IR strength. Shoulder ROM has slightly declined in abduction and ER. R elbow AROM has improved in flexion, pronation, and supination, with most limitation still remaining in supination. Patient was able to reach to overhead shelf with 1 and 2lb weights, but with mild pain in the R shoulder. Worked on shoulder stretching in order to address patient's remaining ROM limitations, particularly focusing on ER as patient has demonstrated a decline here. Educated patient on the use of an over the door pulley in order continue improving ROM. Patient reports understanding and without complaints at end of session. Patient is showing variable improvement with therapy. Would benefit from additional skilled PT services 2x/week for 6 weeks to address remaining impairments.    Comorbidities DMII, anemia, a-fib, migraines, HLT, HLD, GERD, depression, anxiety, R achilles tendon surgery, B carpal tunnel release, R RTC repair 2015, B TKA    Rehab Potential Good    PT Frequency 2x / week    PT  Duration 6 weeks    PT Treatment/Interventions ADLs/Self Care Home Management;Cryotherapy;Electrical Stimulation;Iontophoresis 36m/ml Dexamethasone;Moist Heat;Therapeutic exercise;Balance training;Therapeutic activities;Functional mobility training;Ultrasound;Neuromuscular re-education;Patient/family education;Manual techniques;Vasopneumatic Device;Taping;Energy conservation;Dry needling;Passive range of motion    PT Next Visit Plan Progress R shoulder ER ROM and elbow supination    Consulted and Agree with Plan of Care Patient           Patient will benefit from skilled therapeutic intervention in order to improve the following deficits and impairments:  Hypomobility, Increased edema, Decreased activity tolerance, Decreased strength, Impaired UE functional use, Pain, Increased fascial restricitons, Increased muscle spasms, Decreased range of motion, Improper body mechanics, Postural dysfunction, Impaired flexibility  Visit Diagnosis: Chronic right shoulder pain  Stiffness of right shoulder, not elsewhere classified  Stiffness of right elbow, not elsewhere classified  Other symptoms and signs involving the musculoskeletal system  Localized edema     Problem List Patient Active Problem List   Diagnosis Date Noted  . Unilateral primary osteoarthritis, right knee 12/10/2018  . Status post total right knee replacement 12/10/2018  . Long term (current) use of anticoagulants 08/16/2018  . Hypertensive cardiovascular disease 06/04/2018  . Morbid obesity with BMI  of 40.0-44.9, adult ([facility]) 06/04/2018  . Fatigue 06/04/2018  . Chronic anticoagulation   . PAF (paroxysmal atrial fibrillation) ([facility]) 05/02/2018  . Primary osteoarthritis of right knee 06/14/2017  . OSA (obstructive sleep apnea) 02/13/2017  . NIDDM-type 2 10/31/2016  . Essential hypertension 08/02/2016  . Anxiety and depression 08/02/2016  . Insomnia 08/02/2016  . Arthritis of left knee 10/13/2014  . Status post total left  knee replacement 10/13/2014     Janene Harvey, PT, DPT 03/18/20 6:17 PM   [facility] [address]  [place], [place], [address] Phone: [phone]   Fax:  [phone]  Name: Debra Wright MRN: [id number] Date of Birth: 11-Jun-1951  PHYSICAL THERAPY DISCHARGE SUMMARY  Visits from Start of Care: 8  Current functional level related to goals / functional outcomes: See above clinical impression; patient was advised by MD/PA to discontinue PT d/t MRI results    Remaining deficits: Decreased ROM, strength, functional activity tolerance   Education / Equipment: HEP  Plan: Patient agrees to discharge.  Patient goals were partially met. Patient is being discharged due to the physician's request.  ?????     Janene Harvey, PT, DPT 05/06/20 2:32 PM

## 2020-03-22 ENCOUNTER — Encounter: Payer: Self-pay | Admitting: Physician Assistant

## 2020-03-22 ENCOUNTER — Ambulatory Visit (INDEPENDENT_AMBULATORY_CARE_PROVIDER_SITE_OTHER): Payer: Medicare Other | Admitting: Physician Assistant

## 2020-03-22 ENCOUNTER — Telehealth: Payer: Self-pay | Admitting: Cardiovascular Disease

## 2020-03-22 ENCOUNTER — Other Ambulatory Visit: Payer: Self-pay

## 2020-03-22 DIAGNOSIS — M7541 Impingement syndrome of right shoulder: Secondary | ICD-10-CM

## 2020-03-22 MED ORDER — DIAZEPAM 5 MG PO TABS: ORAL_TABLET | ORAL | 0 refills | Status: DC

## 2020-03-22 NOTE — Telephone Encounter (Signed)
° ° °  Pt called need to let coumadin she needs to hold her blood thinner, advised that her clearance is on her chart and coumadin has been informed. She said she will schedule coumadin after her procedure

## 2020-03-22 NOTE — Progress Notes (Signed)
Office Visit Note   Patient: Debra Wright           Date of Birth: Dec 19, 1950           MRN: [id number] Visit Date: 03/22/2020              Requested by: Debra Blase, MD [address] [place],  [place] [address] PCP: Debra Blase, MD   Assessment & Plan: Visit Diagnoses:  1. Impingement syndrome of right shoulder     Plan: We will obtain an MRI of her right shoulder to rule out rotator cuff tear.  Have her follow-up after the MRI to go over results and discuss further treatment.  Questions encouraged and answered.  Follow-Up Instructions: Return follow up after MRI.   Orders:  No orders of the defined types were placed in this encounter.  Meds ordered this encounter  Medications  . diazepam (VALIUM) 5 MG tablet    Sig: TAKE ONE TAB ONE HOUR PRIOR TO MRI REPEAT AS NEEDED #2 . ZERO REFILLS    Dispense:  2 tablet    Refill:  0      Procedures: No procedures performed   Clinical Data: No additional findings.   Subjective: Chief Complaint  Patient presents with  . Right Shoulder - Follow-up    HPI Debra Wright returns today due to right shoulder pain.  She states that the right shoulder injection on 01/28/2020 helped some but she still having pain has difficulty with range of motion.  She has had no new injury.  History of right shoulder arthroscopy with subacromial decompression and extensive debridement and arthroscopic assisted rotator cuff repair 2015 had done well until recently. Review of Systems See HPI  Objective: Vital Signs: There were no vitals taken for this visit.  Physical Exam Constitutional:      Appearance: She is not ill-appearing.  Pulmonary:     Effort: Pulmonary effort is normal.  Neurological:     Mental Status: She is alert.     Ortho Exam Shoulders 5 5 strength in external/internal rotation against resistance empty can test is negative.  Positive liftoff test on the right negative on the left.  Positive impingement testing on  the right negative on the left.  Supination is still lacking the last few degrees of the right arm compared to left which is full. Specialty Comments:  No specialty comments available.  Imaging: No results found.   PMFS History: Patient Active Problem List   Diagnosis Date Noted  . Unilateral primary osteoarthritis, right knee 12/10/2018  . Status post total right knee replacement 12/10/2018  . Long term (current) use of anticoagulants 08/16/2018  . Hypertensive cardiovascular disease 06/04/2018  . Morbid obesity with BMI of 40.0-44.9, adult ([facility]) 06/04/2018  . Fatigue 06/04/2018  . Chronic anticoagulation   . PAF (paroxysmal atrial fibrillation) ([facility]) 05/02/2018  . Primary osteoarthritis of right knee 06/14/2017  . OSA (obstructive sleep apnea) 02/13/2017  . NIDDM-type 2 10/31/2016  . Essential hypertension 08/02/2016  . Anxiety and depression 08/02/2016  . Insomnia 08/02/2016  . Arthritis of left knee 10/13/2014  . Status post total left knee replacement 10/13/2014   Past Medical History:  Diagnosis Date  . Anxiety   . Arthritis    "knees; right thumb" (10/14/2014)  . Basal cell carcinoma    "burned off upper lip & right shoulder"  . Chronic anticoagulation    CHADS VASC=3  . Complication of anesthesia    after spinal anesthesia for  left knee replacement, bladder did "not wake up". Was incontinent for 4 days post surgery  . Depression   . Fibroid tumor   . Frequent diarrhea   . GERD (gastroesophageal reflux disease)   . High cholesterol   . Hypertension    EF 65-70% grade 2DD  . Insomnia   . Migraine    "frequently when I was younger; maybe monthly now" (10/14/2014) - none after hysterectomy  . PAF (paroxysmal atrial fibrillation) ([facility]) 05/02/2018   converted with rate control  . Pernicious anemia    pt not aware of this  . Sleep apnea    uses a cpap  . Type II diabetes mellitus ([facility])   . Vitamin B 12 deficiency     Family History  Problem Relation Age of  Onset  . Arthritis Mother   . Hyperlipidemia Mother   . Diabetes Mother   . Hypertension Mother   . Arthritis Father   . Hyperlipidemia Father   . Heart disease Father   . Diabetes Father   . Stroke Brother   . Hypertension Brother   . Diabetes Brother   . Arthritis Maternal Grandmother   . Hyperlipidemia Maternal Grandmother   . Diabetes Maternal Grandmother   . Hypertension Maternal Grandmother   . Arthritis Maternal Grandfather   . Hyperlipidemia Maternal Grandfather   . Hypertension Maternal Grandfather   . Arthritis Paternal Grandmother   . Hyperlipidemia Paternal Grandmother   . Diabetes Paternal Grandmother   . Hypertension Paternal Grandmother   . Arthritis Paternal Grandfather   . Hyperlipidemia Paternal Grandfather   . Diabetes Paternal Grandfather   . Hypertension Paternal Grandfather     Past Surgical History:  Procedure Laterality Date  . ABDOMINAL HYSTERECTOMY  1988  . ACHILLES TENDON SURGERY Right   . APPENDECTOMY  1988  . BILATERAL OOPHORECTOMY  2004  . CARPAL TUNNEL RELEASE Bilateral 1986  . CATARACT EXTRACTION W/ INTRAOCULAR LENS  IMPLANT, BILATERAL Bilateral 2011  . COLONOSCOPY    . JOINT REPLACEMENT    . SHOULDER ARTHROSCOPY DISTAL CLAVICLE EXCISION AND OPEN ROTATOR CUFF REPAIR Right 11/2013  . TOENAIL EXCISION Right 04/2018   "big toe"  . TONSILLECTOMY  1950's  . TOTAL KNEE ARTHROPLASTY Left 10/13/2014   Procedure: LEFT TOTAL KNEE ARTHROPLASTY;  Surgeon: Mcarthur Rossetti, MD;  Location: [facility];  Service: Orthopedics;  Laterality: Left;  . TOTAL KNEE ARTHROPLASTY Right 12/10/2018   Procedure: RIGHT TOTAL KNEE ARTHROPLASTY;  Surgeon: Mcarthur Rossetti, MD;  Location: [facility];  Service: Orthopedics;  Laterality: Right;   Social History   Occupational History  . Not on file  Tobacco Use  . Smoking status: Never Smoker  . Smokeless tobacco: Never Used  Vaping Use  . Vaping Use: Never used  Substance and Sexual Activity  . Alcohol use: Yes     Comment: 10/14/2014 "might have a drink when I'm out once/month"  . Drug use: No  . Sexual activity: Not Currently

## 2020-03-23 ENCOUNTER — Other Ambulatory Visit: Payer: Self-pay | Admitting: Radiology

## 2020-03-23 DIAGNOSIS — M25511 Pain in right shoulder: Secondary | ICD-10-CM

## 2020-03-25 ENCOUNTER — Ambulatory Visit: Payer: Medicare Other | Admitting: Physical Therapy

## 2020-03-29 ENCOUNTER — Other Ambulatory Visit: Payer: Self-pay | Admitting: Orthopaedic Surgery

## 2020-03-29 MED ORDER — DIAZEPAM 5 MG PO TABS: ORAL_TABLET | ORAL | 0 refills | Status: DC

## 2020-03-30 ENCOUNTER — Ambulatory Visit: Payer: Medicare Other | Admitting: Physical Therapy

## 2020-03-30 ENCOUNTER — Encounter (INDEPENDENT_AMBULATORY_CARE_PROVIDER_SITE_OTHER): Payer: Self-pay | Admitting: Family Medicine

## 2020-03-30 MED ORDER — AMOXICILLIN 500 MG PO TABS
1000.0000 mg | ORAL_TABLET | Freq: Two times a day (BID) | ORAL | 0 refills | Status: DC
Start: 2020-03-30 — End: 2020-11-29

## 2020-04-01 ENCOUNTER — Ambulatory Visit: Payer: Medicare Other | Admitting: Physical Therapy

## 2020-04-02 ENCOUNTER — Other Ambulatory Visit: Payer: Self-pay

## 2020-04-02 ENCOUNTER — Ambulatory Visit (INDEPENDENT_AMBULATORY_CARE_PROVIDER_SITE_OTHER): Payer: Medicare Other | Admitting: Pharmacist Clinician (PhC)/ Clinical Pharmacy Specialist

## 2020-04-02 DIAGNOSIS — Z7901 Long term (current) use of anticoagulants: Secondary | ICD-10-CM | POA: Diagnosis not present

## 2020-04-02 DIAGNOSIS — I4891 Unspecified atrial fibrillation: Secondary | ICD-10-CM

## 2020-04-02 LAB — POCT INR: INR: 1.7 — AB (ref 2.0–3.0)

## 2020-04-03 ENCOUNTER — Ambulatory Visit (HOSPITAL_BASED_OUTPATIENT_CLINIC_OR_DEPARTMENT_OTHER)
Admission: RE | Admit: 2020-04-03 | Discharge: 2020-04-03 | Disposition: A | Discharge status: Home / Self Care | Payer: Medicare Other | Source: Ambulatory Visit | Attending: Physician Assistant | Admitting: Physician Assistant

## 2020-04-03 DIAGNOSIS — G8929 Other chronic pain: Secondary | ICD-10-CM | POA: Diagnosis present

## 2020-04-03 DIAGNOSIS — M25511 Pain in right shoulder: Secondary | ICD-10-CM | POA: Diagnosis present

## 2020-04-05 ENCOUNTER — Other Ambulatory Visit (INDEPENDENT_AMBULATORY_CARE_PROVIDER_SITE_OTHER): Payer: Self-pay | Admitting: Family Medicine

## 2020-04-06 ENCOUNTER — Ambulatory Visit: Payer: Medicare Other | Admitting: Physical Therapy

## 2020-04-06 MED ORDER — CLONAZEPAM 0.5 MG PO TABS: ORAL_TABLET | ORAL | 0 refills | Status: DC

## 2020-04-06 NOTE — Telephone Encounter (Signed)
Please advise if you would like to refill, thank you.

## 2020-04-07 ENCOUNTER — Encounter: Payer: Self-pay | Admitting: *Deleted

## 2020-04-08 ENCOUNTER — Ambulatory Visit: Payer: Medicare Other | Admitting: Physical Therapy

## 2020-04-12 ENCOUNTER — Encounter (INDEPENDENT_AMBULATORY_CARE_PROVIDER_SITE_OTHER): Payer: Self-pay | Admitting: Family Medicine

## 2020-04-13 ENCOUNTER — Ambulatory Visit: Payer: Medicare Other | Admitting: Physical Therapy

## 2020-04-13 ENCOUNTER — Ambulatory Visit: Payer: Medicare Other | Admitting: Physician Assistant

## 2020-04-13 MED ORDER — ESZOPICLONE 3 MG PO TABS: 3.0000 mg | ORAL_TABLET | Freq: Every evening | ORAL | 3 refills | Status: DC | PRN

## 2020-04-15 ENCOUNTER — Encounter: Payer: Self-pay | Admitting: Physician Assistant

## 2020-04-15 ENCOUNTER — Ambulatory Visit (INDEPENDENT_AMBULATORY_CARE_PROVIDER_SITE_OTHER): Payer: Medicare Other | Admitting: Physician Assistant

## 2020-04-15 ENCOUNTER — Other Ambulatory Visit: Payer: Self-pay

## 2020-04-15 ENCOUNTER — Ambulatory Visit: Payer: Medicare Other | Admitting: Physical Therapy

## 2020-04-15 DIAGNOSIS — M75121 Complete rotator cuff tear or rupture of right shoulder, not specified as traumatic: Secondary | ICD-10-CM

## 2020-04-15 NOTE — Progress Notes (Signed)
HPI: Debra Wright returns today to go over the MRI of the right shoulder.  She states she is only having pain in the shoulder at night.  States she is functional during the day and has no pain in the shoulder at this point time.  Right shoulder MRI is reviewed with the patient today actual images are shown to the patient.  MRI showed a complete rotator cuff tear with 4 to 5 cm of retraction of the supraspinatus and infraspinatus tendons.  Moderate to moderately severe fatty atrophy of both muscle bellies noted.  Mild to moderate glenohumeral arthritic changes noted with a inferior humeral head osteophyte.   Impression: Right shoulder recurrent rotator cuff tear Right shoulder mild to moderate glenohumeral arthritic changes  Plan: At this point time patient does not wish to proceed with any type of surgical intervention.  She has developed periodic subacromial injections feel this definitely could be of benefit would recommend no more often than every 3 to 4 months.  She may also benefit from an intra-articular injection in the right shoulder in the future.  Questions encouraged and answered at length.  Follow-up as needed.

## 2020-04-20 ENCOUNTER — Ambulatory Visit: Payer: Medicare Other | Admitting: Physical Therapy

## 2020-04-21 DIAGNOSIS — M47816 Spondylosis without myelopathy or radiculopathy, lumbar region: Secondary | ICD-10-CM | POA: Diagnosis not present

## 2020-04-21 DIAGNOSIS — M4300 Spondylolysis, site unspecified: Secondary | ICD-10-CM | POA: Diagnosis not present

## 2020-04-21 DIAGNOSIS — M5030 Other cervical disc degeneration, unspecified cervical region: Secondary | ICD-10-CM | POA: Diagnosis not present

## 2020-04-21 DIAGNOSIS — M4310 Spondylolisthesis, site unspecified: Secondary | ICD-10-CM | POA: Diagnosis not present

## 2020-04-21 DIAGNOSIS — M5416 Radiculopathy, lumbar region: Secondary | ICD-10-CM | POA: Diagnosis not present

## 2020-04-21 DIAGNOSIS — M48062 Spinal stenosis, lumbar region with neurogenic claudication: Secondary | ICD-10-CM | POA: Diagnosis not present

## 2020-04-22 ENCOUNTER — Ambulatory Visit: Payer: Medicare Other | Admitting: Physical Therapy

## 2020-04-27 ENCOUNTER — Ambulatory Visit: Payer: Medicare Other | Admitting: Physical Therapy

## 2020-04-29 ENCOUNTER — Ambulatory Visit: Payer: Medicare Other | Admitting: Physical Therapy

## 2020-04-30 ENCOUNTER — Ambulatory Visit (INDEPENDENT_AMBULATORY_CARE_PROVIDER_SITE_OTHER): Payer: Medicare Other | Admitting: Pharmacist

## 2020-04-30 ENCOUNTER — Other Ambulatory Visit: Payer: Self-pay

## 2020-04-30 DIAGNOSIS — Z7901 Long term (current) use of anticoagulants: Secondary | ICD-10-CM

## 2020-04-30 DIAGNOSIS — I4891 Unspecified atrial fibrillation: Secondary | ICD-10-CM | POA: Diagnosis not present

## 2020-04-30 LAB — POCT INR: INR: 2.6 (ref 2.0–3.0)

## 2020-05-01 ENCOUNTER — Other Ambulatory Visit (INDEPENDENT_AMBULATORY_CARE_PROVIDER_SITE_OTHER): Payer: Self-pay | Admitting: Family Medicine

## 2020-05-01 DIAGNOSIS — Z760 Encounter for issue of repeat prescription: Secondary | ICD-10-CM

## 2020-05-24 ENCOUNTER — Encounter: Payer: Self-pay | Admitting: Cardiology

## 2020-05-24 ENCOUNTER — Other Ambulatory Visit: Payer: Self-pay | Admitting: Cardiovascular Disease

## 2020-05-24 ENCOUNTER — Ambulatory Visit (INDEPENDENT_AMBULATORY_CARE_PROVIDER_SITE_OTHER): Payer: Medicare Other | Admitting: Cardiology

## 2020-05-24 DIAGNOSIS — Z7901 Long term (current) use of anticoagulants: Secondary | ICD-10-CM

## 2020-05-24 DIAGNOSIS — I480 Paroxysmal atrial fibrillation: Secondary | ICD-10-CM | POA: Diagnosis not present

## 2020-05-24 LAB — POCT INR: INR: 1.1 — AB (ref 2.0–3.0)

## 2020-05-24 NOTE — Progress Notes (Signed)
This encounter was created in error - please disregard.

## 2020-05-24 NOTE — Patient Instructions (Signed)
Description   Take 2 tablets today and tomorrow, then continue with 1 tablet daily except for 1 and 1/2 tablets each Monday, Wednesdays, and Friday.  Repeat INR in 1 week.

## 2020-05-27 ENCOUNTER — Other Ambulatory Visit (INDEPENDENT_AMBULATORY_CARE_PROVIDER_SITE_OTHER): Payer: Self-pay | Admitting: Family Medicine

## 2020-05-27 ENCOUNTER — Encounter (INDEPENDENT_AMBULATORY_CARE_PROVIDER_SITE_OTHER): Payer: Self-pay | Admitting: Family Medicine

## 2020-05-27 ENCOUNTER — Other Ambulatory Visit: Payer: Self-pay

## 2020-05-27 DIAGNOSIS — Z760 Encounter for issue of repeat prescription: Secondary | ICD-10-CM

## 2020-05-28 MED ORDER — OMEPRAZOLE 40 MG PO CPDR: 40.0000 mg | DELAYED_RELEASE_CAPSULE | Freq: Every day | ORAL | 1 refills | Status: DC

## 2020-05-28 MED ORDER — WARFARIN SODIUM 5 MG PO TABS: ORAL_TABLET | ORAL | 0 refills | Status: DC

## 2020-05-28 MED ORDER — CLONAZEPAM 0.5 MG PO TABS: ORAL_TABLET | ORAL | 0 refills | Status: DC

## 2020-05-28 NOTE — Telephone Encounter (Signed)
Refill

## 2020-05-28 NOTE — Telephone Encounter (Signed)
duplicate

## 2020-06-01 ENCOUNTER — Ambulatory Visit (INDEPENDENT_AMBULATORY_CARE_PROVIDER_SITE_OTHER): Payer: Medicare Other | Admitting: Internal Medicine

## 2020-06-01 DIAGNOSIS — Z7901 Long term (current) use of anticoagulants: Secondary | ICD-10-CM

## 2020-06-01 LAB — POCT INR: INR: 2.1 (ref 2.0–3.0)

## 2020-06-03 NOTE — Telephone Encounter (Signed)
Spoke with pharmacist. Pt is on home monitor for coumadin at this time.

## 2020-06-07 ENCOUNTER — Encounter (INDEPENDENT_AMBULATORY_CARE_PROVIDER_SITE_OTHER): Payer: Self-pay | Admitting: Family Medicine

## 2020-06-23 ENCOUNTER — Other Ambulatory Visit: Payer: Self-pay

## 2020-06-23 ENCOUNTER — Ambulatory Visit (INDEPENDENT_AMBULATORY_CARE_PROVIDER_SITE_OTHER): Payer: Medicare Other

## 2020-06-23 DIAGNOSIS — Z7901 Long term (current) use of anticoagulants: Secondary | ICD-10-CM

## 2020-06-23 DIAGNOSIS — I4891 Unspecified atrial fibrillation: Secondary | ICD-10-CM | POA: Diagnosis not present

## 2020-06-23 LAB — POCT INR: INR: 6.3 — AB (ref 2.0–3.0)

## 2020-06-23 NOTE — Patient Instructions (Signed)
Hold today, Thursday and Friday Continue with 1 tablet daily except for 1 and 1/2 tablets each Monday, Wednesdays, and Friday.  Repeat INR  10/1

## 2020-06-25 ENCOUNTER — Encounter (INDEPENDENT_AMBULATORY_CARE_PROVIDER_SITE_OTHER): Payer: Self-pay | Admitting: Family Medicine

## 2020-06-25 MED ORDER — TOLTERODINE TARTRATE ER 2 MG PO CP24: 2.0000 mg | ORAL_CAPSULE | Freq: Every day | ORAL | 3 refills | Status: DC

## 2020-06-28 DIAGNOSIS — M5416 Radiculopathy, lumbar region: Secondary | ICD-10-CM | POA: Diagnosis not present

## 2020-07-07 ENCOUNTER — Other Ambulatory Visit: Payer: Self-pay

## 2020-07-07 ENCOUNTER — Ambulatory Visit (INDEPENDENT_AMBULATORY_CARE_PROVIDER_SITE_OTHER): Payer: Medicare Other

## 2020-07-07 DIAGNOSIS — I4891 Unspecified atrial fibrillation: Secondary | ICD-10-CM

## 2020-07-07 DIAGNOSIS — Z7901 Long term (current) use of anticoagulants: Secondary | ICD-10-CM | POA: Diagnosis not present

## 2020-07-07 LAB — POCT INR: INR: 2.8 (ref 2.0–3.0)

## 2020-07-07 NOTE — Patient Instructions (Signed)
Continue with 1 tablet daily except for 1 and 1/2 tablets each Monday, Wednesdays, and Friday.  Repeat INR 3 weeks

## 2020-07-10 ENCOUNTER — Other Ambulatory Visit: Payer: Self-pay | Admitting: Cardiovascular Disease

## 2020-07-11 ENCOUNTER — Other Ambulatory Visit (INDEPENDENT_AMBULATORY_CARE_PROVIDER_SITE_OTHER): Payer: Self-pay | Admitting: Family Medicine

## 2020-07-12 ENCOUNTER — Other Ambulatory Visit (INDEPENDENT_AMBULATORY_CARE_PROVIDER_SITE_OTHER): Payer: Self-pay | Admitting: Family Medicine

## 2020-07-12 MED ORDER — CLONAZEPAM 0.5 MG PO TABS
ORAL_TABLET | ORAL | 0 refills | Status: DC
Start: 2020-07-12 — End: 2020-08-02

## 2020-07-21 ENCOUNTER — Ambulatory Visit (INDEPENDENT_AMBULATORY_CARE_PROVIDER_SITE_OTHER): Payer: Medicare Other | Admitting: Cardiovascular Disease

## 2020-07-21 DIAGNOSIS — Z7901 Long term (current) use of anticoagulants: Secondary | ICD-10-CM | POA: Diagnosis not present

## 2020-07-21 LAB — POCT INR: INR: 2.8 (ref 2.0–3.0)

## 2020-07-22 NOTE — Addendum Note (Signed)
Addended by: Harrington Challenger on: 07/22/2020 11:06 AM   Modules accepted: Level of Service, SmartSet

## 2020-07-22 NOTE — Progress Notes (Signed)
This encounter was created in error - please disregard.

## 2020-07-23 ENCOUNTER — Encounter (INDEPENDENT_AMBULATORY_CARE_PROVIDER_SITE_OTHER): Payer: Self-pay | Admitting: Family Medicine

## 2020-07-23 MED ORDER — TOLTERODINE TARTRATE ER 2 MG PO CP24: 2.0000 mg | ORAL_CAPSULE | Freq: Every day | ORAL | 6 refills | Status: DC

## 2020-07-23 MED ORDER — TOLTERODINE TARTRATE ER 4 MG PO CP24: 4.0000 mg | ORAL_CAPSULE | Freq: Every day | ORAL | 11 refills | Status: DC

## 2020-07-26 DIAGNOSIS — Z23 Encounter for immunization: Secondary | ICD-10-CM | POA: Diagnosis not present

## 2020-07-28 ENCOUNTER — Other Ambulatory Visit: Payer: Self-pay | Admitting: Cardiovascular Disease

## 2020-07-28 DIAGNOSIS — M47816 Spondylosis without myelopathy or radiculopathy, lumbar region: Secondary | ICD-10-CM | POA: Diagnosis not present

## 2020-07-28 DIAGNOSIS — M5136 Other intervertebral disc degeneration, lumbar region: Secondary | ICD-10-CM | POA: Diagnosis not present

## 2020-08-02 ENCOUNTER — Other Ambulatory Visit: Payer: Self-pay

## 2020-08-02 ENCOUNTER — Ambulatory Visit (INDEPENDENT_AMBULATORY_CARE_PROVIDER_SITE_OTHER): Payer: Medicare Other

## 2020-08-02 ENCOUNTER — Telehealth: Payer: Self-pay

## 2020-08-02 ENCOUNTER — Other Ambulatory Visit (INDEPENDENT_AMBULATORY_CARE_PROVIDER_SITE_OTHER): Payer: Self-pay | Admitting: Family Medicine

## 2020-08-02 DIAGNOSIS — Z7901 Long term (current) use of anticoagulants: Secondary | ICD-10-CM | POA: Diagnosis not present

## 2020-08-02 DIAGNOSIS — I4891 Unspecified atrial fibrillation: Secondary | ICD-10-CM | POA: Diagnosis not present

## 2020-08-02 LAB — POCT INR: INR: 3.7 — AB (ref 2.0–3.0)

## 2020-08-02 NOTE — Patient Instructions (Signed)
Hold tonight and then Continue with 1 tablet daily except for 1 and 1/2 tablets each Monday, Wednesdays, and Friday.  Repeat INR 3 weeks

## 2020-08-03 MED ORDER — CLONAZEPAM 0.5 MG PO TABS
ORAL_TABLET | ORAL | 0 refills | Status: DC
Start: 2020-08-03 — End: 2020-08-07

## 2020-08-04 ENCOUNTER — Other Ambulatory Visit: Payer: Self-pay | Admitting: Cardiovascular Disease

## 2020-08-05 ENCOUNTER — Other Ambulatory Visit: Payer: Self-pay | Admitting: Cardiovascular Disease

## 2020-08-05 ENCOUNTER — Encounter (INDEPENDENT_AMBULATORY_CARE_PROVIDER_SITE_OTHER): Payer: Self-pay | Admitting: Family Medicine

## 2020-08-05 MED ORDER — METOPROLOL TARTRATE 25 MG PO TABS: ORAL_TABLET | ORAL | 0 refills | Status: DC

## 2020-08-06 NOTE — Telephone Encounter (Signed)
lpmtcb 

## 2020-08-07 ENCOUNTER — Encounter (INDEPENDENT_AMBULATORY_CARE_PROVIDER_SITE_OTHER): Payer: Self-pay | Admitting: Family Medicine

## 2020-08-07 ENCOUNTER — Other Ambulatory Visit (INDEPENDENT_AMBULATORY_CARE_PROVIDER_SITE_OTHER): Payer: Self-pay | Admitting: Family Medicine

## 2020-08-09 MED ORDER — CLONAZEPAM 0.5 MG PO TABS
ORAL_TABLET | ORAL | 0 refills | Status: DC
Start: 2020-08-09 — End: 2020-09-07

## 2020-08-16 ENCOUNTER — Encounter (INDEPENDENT_AMBULATORY_CARE_PROVIDER_SITE_OTHER): Payer: Self-pay | Admitting: Family Medicine

## 2020-08-16 DIAGNOSIS — N3289 Other specified disorders of bladder: Secondary | ICD-10-CM

## 2020-08-17 ENCOUNTER — Ambulatory Visit: Payer: Medicare Other

## 2020-08-18 ENCOUNTER — Other Ambulatory Visit: Payer: Self-pay

## 2020-08-18 ENCOUNTER — Ambulatory Visit (INDEPENDENT_AMBULATORY_CARE_PROVIDER_SITE_OTHER): Payer: Medicare Other

## 2020-08-18 DIAGNOSIS — N3289 Other specified disorders of bladder: Secondary | ICD-10-CM | POA: Diagnosis not present

## 2020-08-18 DIAGNOSIS — R350 Frequency of micturition: Secondary | ICD-10-CM | POA: Diagnosis not present

## 2020-08-18 DIAGNOSIS — R3915 Urgency of urination: Secondary | ICD-10-CM | POA: Diagnosis not present

## 2020-08-18 NOTE — Progress Notes (Signed)
Lab visit only: urine specimen collected for UA w/microscopic & reflex UC

## 2020-08-20 ENCOUNTER — Other Ambulatory Visit: Payer: Self-pay | Admitting: Cardiovascular Disease

## 2020-08-20 ENCOUNTER — Other Ambulatory Visit: Payer: Self-pay

## 2020-08-21 LAB — URINE CULTURE
MICRO NUMBER:: 11217993
SPECIMEN QUALITY:: ADEQUATE

## 2020-08-21 LAB — URINALYSIS W MICROSCOPIC + REFLEX CULTURE
Bacteria, UA: NONE SEEN /HPF
Bilirubin Urine: NEGATIVE
Glucose, UA: NEGATIVE
Hgb urine dipstick: NEGATIVE
Hyaline Cast: NONE SEEN /LPF
Ketones, ur: NEGATIVE
Nitrites, Initial: NEGATIVE
Protein, ur: NEGATIVE
RBC / HPF: NONE SEEN /HPF (ref 0–2)
Specific Gravity, Urine: 1.012 (ref 1.001–1.03)
Squamous Epithelial / HPF: NONE SEEN /HPF (ref <=5)
pH: 6.5 (ref 5.0–8.0)

## 2020-08-21 LAB — CULTURE INDICATED

## 2020-08-23 ENCOUNTER — Telehealth: Payer: Self-pay | Admitting: Family Medicine

## 2020-08-23 ENCOUNTER — Encounter (INDEPENDENT_AMBULATORY_CARE_PROVIDER_SITE_OTHER): Payer: Self-pay | Admitting: Family Medicine

## 2020-08-23 ENCOUNTER — Ambulatory Visit (INDEPENDENT_AMBULATORY_CARE_PROVIDER_SITE_OTHER): Payer: Medicare Other

## 2020-08-23 DIAGNOSIS — Z7901 Long term (current) use of anticoagulants: Secondary | ICD-10-CM

## 2020-08-23 DIAGNOSIS — I4891 Unspecified atrial fibrillation: Secondary | ICD-10-CM

## 2020-08-23 LAB — POCT INR: INR: 2.1 (ref 2.0–3.0)

## 2020-08-23 MED ORDER — WARFARIN SODIUM 5 MG PO TABS
ORAL_TABLET | ORAL | 1 refills | Status: DC
Start: 2020-08-23 — End: 2021-06-16

## 2020-08-23 MED ORDER — SULFAMETHOXAZOLE-TRIMETHOPRIM 800-160 MG PO TABS: 1.0000 | ORAL_TABLET | Freq: Two times a day (BID) | ORAL | 0 refills | Status: DC

## 2020-08-23 MED ORDER — WARFARIN SODIUM 5 MG PO TABS: ORAL_TABLET | ORAL | 6 refills | Status: DC

## 2020-08-23 NOTE — Patient Instructions (Signed)
Continue with 1 tablet daily except for 1 and 1/2 tablets each Monday, Wednesdays, and Friday.  Repeat INR 1 week (Started Bactrim bid for 10 days)

## 2020-08-23 NOTE — Telephone Encounter (Signed)
Urine culture grew E. Coli.  Will call in Bactrim for treatment.

## 2020-09-03 ENCOUNTER — Other Ambulatory Visit: Payer: Self-pay

## 2020-09-03 ENCOUNTER — Ambulatory Visit (INDEPENDENT_AMBULATORY_CARE_PROVIDER_SITE_OTHER): Payer: Medicare Other

## 2020-09-03 DIAGNOSIS — Z7901 Long term (current) use of anticoagulants: Secondary | ICD-10-CM

## 2020-09-03 DIAGNOSIS — I4891 Unspecified atrial fibrillation: Secondary | ICD-10-CM | POA: Diagnosis not present

## 2020-09-03 LAB — POCT INR: INR: 3 (ref 2.0–3.0)

## 2020-09-03 NOTE — Patient Instructions (Signed)
Continue with 1 tablet daily except for 1 and 1/2 tablets each Monday, Wednesdays, and Friday.  Repeat INR 5 weeks

## 2020-09-07 ENCOUNTER — Other Ambulatory Visit (INDEPENDENT_AMBULATORY_CARE_PROVIDER_SITE_OTHER): Payer: Self-pay | Admitting: Family Medicine

## 2020-09-08 MED ORDER — CLONAZEPAM 0.5 MG PO TABS
ORAL_TABLET | ORAL | 0 refills | Status: DC
Start: 2020-09-08 — End: 2020-10-08

## 2020-09-08 MED ORDER — ESZOPICLONE 3 MG PO TABS
3.0000 mg | ORAL_TABLET | Freq: Every evening | ORAL | 0 refills | Status: DC | PRN
Start: 2020-09-08 — End: 2020-10-08

## 2020-09-17 ENCOUNTER — Encounter (INDEPENDENT_AMBULATORY_CARE_PROVIDER_SITE_OTHER): Payer: Self-pay | Admitting: Family Medicine

## 2020-09-19 ENCOUNTER — Other Ambulatory Visit (INDEPENDENT_AMBULATORY_CARE_PROVIDER_SITE_OTHER): Payer: Self-pay | Admitting: Family Medicine

## 2020-09-19 DIAGNOSIS — F419 Anxiety disorder, unspecified: Secondary | ICD-10-CM

## 2020-09-19 DIAGNOSIS — Z760 Encounter for issue of repeat prescription: Secondary | ICD-10-CM

## 2020-09-20 MED ORDER — SULFAMETHOXAZOLE-TRIMETHOPRIM 800-160 MG PO TABS: 1.0000 | ORAL_TABLET | Freq: Two times a day (BID) | ORAL | 1 refills | Status: DC

## 2020-09-21 ENCOUNTER — Telehealth: Payer: Self-pay

## 2020-09-21 NOTE — Telephone Encounter (Signed)
Pt called in to let us know that they have started bactrim and that they will need to stop there warfarin on 10/27/19 for a back injection 10/31/19. Will route to pharmd pool

## 2020-09-21 NOTE — Telephone Encounter (Signed)
LMOM for patient to call back.

## 2020-09-22 NOTE — Telephone Encounter (Signed)
lmomed pt to call us back to get scheduled asap

## 2020-09-23 NOTE — Telephone Encounter (Signed)
lmom for overdue inr 

## 2020-09-24 ENCOUNTER — Encounter (INDEPENDENT_AMBULATORY_CARE_PROVIDER_SITE_OTHER): Payer: Self-pay | Admitting: Family Medicine

## 2020-09-27 ENCOUNTER — Other Ambulatory Visit (INDEPENDENT_AMBULATORY_CARE_PROVIDER_SITE_OTHER): Payer: Self-pay | Admitting: Family Medicine

## 2020-09-27 MED ORDER — TOLTERODINE TARTRATE ER 4 MG PO CP24: 8.0000 mg | ORAL_CAPSULE | Freq: Every day | ORAL | 3 refills | Status: DC

## 2020-09-28 ENCOUNTER — Other Ambulatory Visit (INDEPENDENT_AMBULATORY_CARE_PROVIDER_SITE_OTHER): Payer: Self-pay | Admitting: Family Medicine

## 2020-09-30 DIAGNOSIS — M5136 Other intervertebral disc degeneration, lumbar region: Secondary | ICD-10-CM | POA: Diagnosis not present

## 2020-10-08 ENCOUNTER — Other Ambulatory Visit: Payer: Self-pay

## 2020-10-08 ENCOUNTER — Ambulatory Visit (INDEPENDENT_AMBULATORY_CARE_PROVIDER_SITE_OTHER): Payer: Medicare Other | Admitting: Cardiology

## 2020-10-08 ENCOUNTER — Other Ambulatory Visit (INDEPENDENT_AMBULATORY_CARE_PROVIDER_SITE_OTHER): Payer: Self-pay | Admitting: Family Medicine

## 2020-10-08 ENCOUNTER — Telehealth: Payer: Self-pay

## 2020-10-08 ENCOUNTER — Encounter (INDEPENDENT_AMBULATORY_CARE_PROVIDER_SITE_OTHER): Payer: Self-pay | Admitting: Family Medicine

## 2020-10-08 DIAGNOSIS — Z7901 Long term (current) use of anticoagulants: Secondary | ICD-10-CM

## 2020-10-08 DIAGNOSIS — I480 Paroxysmal atrial fibrillation: Secondary | ICD-10-CM | POA: Diagnosis not present

## 2020-10-08 LAB — POCT INR: INR: 4 — AB (ref 2.0–3.0)

## 2020-10-08 MED ORDER — METOPROLOL TARTRATE 25 MG PO TABS: ORAL_TABLET | ORAL | 1 refills | Status: DC

## 2020-10-08 MED ORDER — SPIRONOLACTONE 25 MG PO TABS: ORAL_TABLET | ORAL | 1 refills | Status: DC

## 2020-10-08 MED ORDER — DILTIAZEM HCL ER COATED BEADS 180 MG PO CP24: 180.0000 mg | ORAL_CAPSULE | Freq: Every day | ORAL | 1 refills | Status: DC

## 2020-10-08 MED ORDER — WARFARIN SODIUM 5 MG PO TABS: ORAL_TABLET | ORAL | 1 refills | Status: DC

## 2020-10-08 MED ORDER — ROSUVASTATIN CALCIUM 20 MG PO TABS: 20.0000 mg | ORAL_TABLET | Freq: Every day | ORAL | 1 refills | Status: DC

## 2020-10-08 NOTE — Telephone Encounter (Signed)
Pls advise.  

## 2020-10-09 ENCOUNTER — Other Ambulatory Visit (INDEPENDENT_AMBULATORY_CARE_PROVIDER_SITE_OTHER): Payer: Self-pay | Admitting: Family Medicine

## 2020-10-09 ENCOUNTER — Encounter (INDEPENDENT_AMBULATORY_CARE_PROVIDER_SITE_OTHER): Payer: Self-pay | Admitting: Family Medicine

## 2020-10-10 MED ORDER — ESZOPICLONE 3 MG PO TABS: 3.0000 mg | ORAL_TABLET | Freq: Every evening | ORAL | 0 refills | Status: DC | PRN

## 2020-10-10 MED ORDER — METFORMIN HCL 1000 MG PO TABS: 1000.0000 mg | ORAL_TABLET | Freq: Two times a day (BID) | ORAL | 0 refills | Status: DC

## 2020-10-10 MED ORDER — CLONAZEPAM 0.5 MG PO TABS: ORAL_TABLET | ORAL | 0 refills | Status: DC

## 2020-10-11 ENCOUNTER — Other Ambulatory Visit (INDEPENDENT_AMBULATORY_CARE_PROVIDER_SITE_OTHER): Payer: Self-pay | Admitting: Family Medicine

## 2020-10-11 MED ORDER — GLIPIZIDE ER 5 MG PO TB24
5.0000 mg | ORAL_TABLET | Freq: Every day | ORAL | 3 refills | Status: DC
Start: 2020-10-11 — End: 2020-10-12

## 2020-10-11 MED ORDER — METFORMIN HCL 1000 MG PO TABS: 1000.0000 mg | ORAL_TABLET | Freq: Two times a day (BID) | ORAL | 0 refills | Status: DC

## 2020-10-11 MED ORDER — ESZOPICLONE 3 MG PO TABS: 3.0000 mg | ORAL_TABLET | Freq: Every evening | ORAL | 1 refills | Status: DC | PRN

## 2020-10-11 MED ORDER — CLONAZEPAM 0.5 MG PO TABS: ORAL_TABLET | ORAL | 0 refills | Status: DC

## 2020-10-11 NOTE — Telephone Encounter (Signed)
Sent!

## 2020-10-11 NOTE — Telephone Encounter (Signed)
Please advise 

## 2020-10-11 NOTE — Addendum Note (Signed)
Addended by: Hortencia Pilar on: 10/11/2020 01:42 PM   Modules accepted: Orders

## 2020-10-12 ENCOUNTER — Other Ambulatory Visit (INDEPENDENT_AMBULATORY_CARE_PROVIDER_SITE_OTHER): Payer: Self-pay | Admitting: Family Medicine

## 2020-10-12 MED ORDER — GLIPIZIDE ER 5 MG PO TB24: 5.0000 mg | ORAL_TABLET | Freq: Every day | ORAL | 3 refills | Status: DC

## 2020-10-12 NOTE — Telephone Encounter (Signed)
done

## 2020-10-14 ENCOUNTER — Encounter (INDEPENDENT_AMBULATORY_CARE_PROVIDER_SITE_OTHER): Payer: Self-pay | Admitting: Family Medicine

## 2020-10-14 DIAGNOSIS — Z760 Encounter for issue of repeat prescription: Secondary | ICD-10-CM

## 2020-10-14 DIAGNOSIS — F419 Anxiety disorder, unspecified: Secondary | ICD-10-CM

## 2020-10-14 DIAGNOSIS — F32A Depression, unspecified: Secondary | ICD-10-CM

## 2020-10-14 MED ORDER — CITALOPRAM HYDROBROMIDE 40 MG PO TABS: 40.0000 mg | ORAL_TABLET | Freq: Every day | ORAL | 2 refills | Status: DC

## 2020-10-18 LAB — POCT INR: INR: 3 (ref 2.0–3.0)

## 2020-10-19 ENCOUNTER — Encounter (INDEPENDENT_AMBULATORY_CARE_PROVIDER_SITE_OTHER): Payer: Self-pay | Admitting: Family Medicine

## 2020-10-19 ENCOUNTER — Ambulatory Visit (INDEPENDENT_AMBULATORY_CARE_PROVIDER_SITE_OTHER): Payer: Medicare Other | Admitting: Pharmacist

## 2020-10-19 DIAGNOSIS — Z7901 Long term (current) use of anticoagulants: Secondary | ICD-10-CM

## 2020-10-19 DIAGNOSIS — I480 Paroxysmal atrial fibrillation: Secondary | ICD-10-CM

## 2020-10-19 MED ORDER — SITAGLIPTIN PHOSPHATE 25 MG PO TABS: 25.0000 mg | ORAL_TABLET | Freq: Every day | ORAL | 6 refills | Status: DC

## 2020-10-20 ENCOUNTER — Encounter (INDEPENDENT_AMBULATORY_CARE_PROVIDER_SITE_OTHER): Payer: Self-pay | Admitting: Family Medicine

## 2020-10-20 DIAGNOSIS — E119 Type 2 diabetes mellitus without complications: Secondary | ICD-10-CM

## 2020-10-21 ENCOUNTER — Encounter (INDEPENDENT_AMBULATORY_CARE_PROVIDER_SITE_OTHER): Payer: Self-pay | Admitting: Family Medicine

## 2020-11-02 DIAGNOSIS — M5136 Other intervertebral disc degeneration, lumbar region: Secondary | ICD-10-CM | POA: Diagnosis not present

## 2020-11-02 DIAGNOSIS — M4310 Spondylolisthesis, site unspecified: Secondary | ICD-10-CM | POA: Diagnosis not present

## 2020-11-02 DIAGNOSIS — M47816 Spondylosis without myelopathy or radiculopathy, lumbar region: Secondary | ICD-10-CM | POA: Diagnosis not present

## 2020-11-02 MED ORDER — SPIRONOLACTONE 25 MG PO TABS: ORAL_TABLET | ORAL | 3 refills | Status: DC

## 2020-11-08 ENCOUNTER — Encounter (INDEPENDENT_AMBULATORY_CARE_PROVIDER_SITE_OTHER): Payer: Self-pay | Admitting: Family Medicine

## 2020-11-08 ENCOUNTER — Telehealth: Payer: Self-pay

## 2020-11-08 NOTE — Telephone Encounter (Signed)
Called and lmomed the pt that they were overue for inr check

## 2020-11-09 ENCOUNTER — Other Ambulatory Visit (INDEPENDENT_AMBULATORY_CARE_PROVIDER_SITE_OTHER): Payer: Self-pay | Admitting: Family Medicine

## 2020-11-09 ENCOUNTER — Encounter (INDEPENDENT_AMBULATORY_CARE_PROVIDER_SITE_OTHER): Payer: Self-pay | Admitting: Family Medicine

## 2020-11-09 LAB — POCT INR: INR: 3.7 — AB (ref 2.0–3.0)

## 2020-11-09 MED ORDER — CVS GLUCOSE METER TEST STRIPS VI STRP: 1.0000 | ORAL_STRIP | Freq: Two times a day (BID) | 12 refills | Status: DC

## 2020-11-09 MED ORDER — ESZOPICLONE 3 MG PO TABS
3.0000 mg | ORAL_TABLET | Freq: Every evening | ORAL | 3 refills | Status: AC | PRN
Start: 2020-11-09 — End: ?

## 2020-11-09 MED ORDER — CVS BLOOD GLUCOSE METER W/DEVICE KIT: 1.0000 [IU] | PACK | Freq: Every day | 11 refills | Status: DC

## 2020-11-10 ENCOUNTER — Ambulatory Visit (INDEPENDENT_AMBULATORY_CARE_PROVIDER_SITE_OTHER): Payer: Medicare Other | Admitting: Cardiovascular Disease

## 2020-11-10 ENCOUNTER — Other Ambulatory Visit: Payer: Self-pay | Admitting: Family Medicine

## 2020-11-10 DIAGNOSIS — Z7901 Long term (current) use of anticoagulants: Secondary | ICD-10-CM | POA: Diagnosis not present

## 2020-11-10 MED ORDER — ACCU-CHEK GUIDE ME W/DEVICE KIT: PACK | 11 refills | Status: AC

## 2020-11-15 ENCOUNTER — Encounter (INDEPENDENT_AMBULATORY_CARE_PROVIDER_SITE_OTHER): Payer: Self-pay | Admitting: Family Medicine

## 2020-11-15 MED ORDER — AZITHROMYCIN 250 MG PO TABS: ORAL_TABLET | ORAL | 0 refills | Status: DC

## 2020-11-21 ENCOUNTER — Encounter (INDEPENDENT_AMBULATORY_CARE_PROVIDER_SITE_OTHER): Payer: Self-pay | Admitting: Family Medicine

## 2020-11-21 DIAGNOSIS — R300 Dysuria: Secondary | ICD-10-CM

## 2020-11-22 MED ORDER — NITROFURANTOIN MONOHYD MACRO 100 MG PO CAPS: 100.0000 mg | ORAL_CAPSULE | Freq: Two times a day (BID) | ORAL | 1 refills | Status: DC

## 2020-11-23 ENCOUNTER — Ambulatory Visit: Payer: Medicare Other

## 2020-11-24 ENCOUNTER — Encounter: Payer: Self-pay | Admitting: Family Medicine

## 2020-11-24 ENCOUNTER — Telehealth: Payer: Self-pay | Admitting: Family Medicine

## 2020-11-24 MED ORDER — FLUCONAZOLE 150 MG PO TABS: 150.0000 mg | ORAL_TABLET | ORAL | 0 refills | Status: DC | PRN

## 2020-11-24 NOTE — Telephone Encounter (Signed)
Done

## 2020-11-24 NOTE — Telephone Encounter (Signed)
[facility] called stating they need Dr.Hilts to add the ICD 10 code so that medicare part B will cover the pts rx. Pharmacy states he can either call and have them add the code or add it himself and fax it back.   [facility] CB# [phone]  Fax# [phone]

## 2020-11-24 NOTE — Telephone Encounter (Signed)
B37.0

## 2020-11-26 ENCOUNTER — Other Ambulatory Visit: Payer: Self-pay | Admitting: Family Medicine

## 2020-11-26 MED ORDER — ACCU-CHEK GUIDE VI STRP: ORAL_STRIP | 12 refills | Status: DC

## 2020-11-27 LAB — POCT INR: INR: 603 — AB (ref 2.0–3.0)

## 2020-11-29 ENCOUNTER — Ambulatory Visit (INDEPENDENT_AMBULATORY_CARE_PROVIDER_SITE_OTHER): Payer: Medicare Other | Admitting: Internal Medicine

## 2020-11-29 ENCOUNTER — Other Ambulatory Visit: Payer: Self-pay

## 2020-11-29 ENCOUNTER — Telehealth: Payer: Self-pay

## 2020-11-29 ENCOUNTER — Encounter: Payer: Self-pay | Admitting: Family Medicine

## 2020-11-29 ENCOUNTER — Ambulatory Visit (INDEPENDENT_AMBULATORY_CARE_PROVIDER_SITE_OTHER): Payer: Medicare Other | Admitting: Family Medicine

## 2020-11-29 VITALS — BP 117/71 | HR 65 | Ht 64.0 in | Wt 242.8 lb

## 2020-11-29 DIAGNOSIS — J029 Acute pharyngitis, unspecified: Secondary | ICD-10-CM | POA: Diagnosis not present

## 2020-11-29 DIAGNOSIS — M21371 Foot drop, right foot: Secondary | ICD-10-CM | POA: Diagnosis not present

## 2020-11-29 DIAGNOSIS — Z7901 Long term (current) use of anticoagulants: Secondary | ICD-10-CM

## 2020-11-29 NOTE — Telephone Encounter (Signed)
lpmtcb in regards to INR result from 2/26

## 2020-11-29 NOTE — Progress Notes (Signed)
Office Visit Note   Patient: Debra Wright           Date of Birth: [birth year]           MRN: [id number] Visit Date: 11/29/2020 Requested by: Eunice Blase, MD [address] [place],  [place] [id number] PCP: Eunice Blase, MD  Subjective: Chief Complaint  Patient presents with  . Other    Throat "feels full." Ritta Slot is getting better. Dry mouth.    HPI: She is here with throat fullness.  About a month ago she developed urinary tract symptoms and this was treated with antibiotics.  That improved but then she developed head congestion.  She was on antibiotics for that as well, and then she developed what seemed like thrush.  She started Diflucan and has made some progress but it has not gone away completely.  Around the same time, probably a little bit before, she developed gait instability.  She started using a cane for support.  She states that she has had her right foot slapping down against the floor when she walks.               ROS:   All other systems were reviewed and are negative.  Objective: Vital Signs: BP 117/71   Pulse 65   Ht 5\' 4"  (1.626 m)   Wt 242 lb 12.8 oz (110.1 kg)   BMI 41.68 kg/m   Physical Exam:  General:  Alert and oriented, in no acute distress. Pulm:  Breathing unlabored. Psy:  Normal mood, congruent affect.  HEENT:  [place]/AT, PERRLA, EOM Full, no nystagmus.  Funduscopic examination within normal limits.  No conjunctival erythema.  Tympanic membranes are pearly gray with normal landmarks.  External ear canals are normal.  Nasal passages are clear.  Oropharynx is clear.  No significant lymphadenopathy.  No thyromegaly or nodules.  2+ carotid pulses without bruits. Lungs: Clear to auscultation throughout. Right leg: She has good strength with ankle dorsiflexion and eversion against resistance.  No foot drop.  When she walks, the right foot makes a louder noise.    Imaging: No results found.  Assessment & Plan: 1.  Thrush, resolving - Finish  diflucan.  2.  Possible foot drop - Did not want workup now.  Will give it some time.       Procedures: No procedures performed        PMFS History: Patient Active Problem List   Diagnosis Date Noted  . Unilateral primary osteoarthritis, right knee 12/10/2018  . Status post total right knee replacement 12/10/2018  . Long term (current) use of anticoagulants 08/16/2018  . Hypertensive cardiovascular disease 06/04/2018  . Morbid obesity with BMI of 40.0-44.9, adult ([place]) 06/04/2018  . Fatigue 06/04/2018  . Chronic anticoagulation   . PAF (paroxysmal atrial fibrillation) ([place]) 05/02/2018  . Primary osteoarthritis of right knee 06/14/2017  . OSA (obstructive sleep apnea) 02/13/2017  . NIDDM-type 2 10/31/2016  . Essential hypertension 08/02/2016  . Anxiety and depression 08/02/2016  . Insomnia 08/02/2016  . Arthritis of left knee 10/13/2014  . Status post total left knee replacement 10/13/2014   Past Medical History:  Diagnosis Date  . Anxiety   . Arthritis    "knees; right thumb" (10/14/2014)  . Basal cell carcinoma    "burned off upper lip & right shoulder"  . Chronic anticoagulation    CHADS VASC=3  . Complication of anesthesia    after spinal anesthesia for left knee replacement, bladder did "not wake up". Was  incontinent for 4 days post surgery  . Depression   . Fibroid tumor   . Frequent diarrhea   . GERD (gastroesophageal reflux disease)   . High cholesterol   . Hypertension    EF 65-70% grade 2DD  . Insomnia   . Migraine    "frequently when I was younger; maybe monthly now" (10/14/2014) - none after hysterectomy  . PAF (paroxysmal atrial fibrillation) ([facility]) 05/02/2018   converted with rate control  . Pernicious anemia    pt not aware of this  . Sleep apnea    uses a cpap  . Type II diabetes mellitus ([facility])   . Vitamin B 12 deficiency     Family History  Problem Relation Age of Onset  . Arthritis Mother   . Hyperlipidemia Mother   . Diabetes Mother    . Hypertension Mother   . Arthritis Father   . Hyperlipidemia Father   . Heart disease Father   . Diabetes Father   . Stroke Brother   . Hypertension Brother   . Diabetes Brother   . Arthritis Maternal Grandmother   . Hyperlipidemia Maternal Grandmother   . Diabetes Maternal Grandmother   . Hypertension Maternal Grandmother   . Arthritis Maternal Grandfather   . Hyperlipidemia Maternal Grandfather   . Hypertension Maternal Grandfather   . Arthritis Paternal Grandmother   . Hyperlipidemia Paternal Grandmother   . Diabetes Paternal Grandmother   . Hypertension Paternal Grandmother   . Arthritis Paternal Grandfather   . Hyperlipidemia Paternal Grandfather   . Diabetes Paternal Grandfather   . Hypertension Paternal Grandfather     Past Surgical History:  Procedure Laterality Date  . ABDOMINAL HYSTERECTOMY  1988  . ACHILLES TENDON SURGERY Right   . APPENDECTOMY  1988  . BILATERAL OOPHORECTOMY  2004  . CARPAL TUNNEL RELEASE Bilateral 1986  . CATARACT EXTRACTION W/ INTRAOCULAR LENS  IMPLANT, BILATERAL Bilateral 2011  . COLONOSCOPY    . JOINT REPLACEMENT    . SHOULDER ARTHROSCOPY DISTAL CLAVICLE EXCISION AND OPEN ROTATOR CUFF REPAIR Right 11/2013  . TOENAIL EXCISION Right 04/2018   "big toe"  . TONSILLECTOMY  1950's  . TOTAL KNEE ARTHROPLASTY Left 10/13/2014   Procedure: LEFT TOTAL KNEE ARTHROPLASTY;  Surgeon: Mcarthur Rossetti, MD;  Location: [facility];  Service: Orthopedics;  Laterality: Left;  . TOTAL KNEE ARTHROPLASTY Right 12/10/2018   Procedure: RIGHT TOTAL KNEE ARTHROPLASTY;  Surgeon: Mcarthur Rossetti, MD;  Location: [facility];  Service: Orthopedics;  Laterality: Right;   Social History   Occupational History  . Not on file  Tobacco Use  . Smoking status: Never Smoker  . Smokeless tobacco: Never Used  Vaping Use  . Vaping Use: Never used  Substance and Sexual Activity  . Alcohol use: Yes    Comment: 10/14/2014 "might have a drink when I'm out once/month"  .  Drug use: No  . Sexual activity: Not Currently

## 2020-11-30 ENCOUNTER — Telehealth: Payer: Self-pay

## 2020-11-30 ENCOUNTER — Encounter: Payer: Self-pay | Admitting: Family Medicine

## 2020-11-30 MED ORDER — CLONAZEPAM 0.5 MG PO TABS: ORAL_TABLET | ORAL | 0 refills | Status: AC

## 2020-11-30 NOTE — Telephone Encounter (Signed)
Lpm that I will call her shortly, around 8:30.Marland Kitchen

## 2020-11-30 NOTE — Telephone Encounter (Signed)
I tried to reach patient numerous times today, leaving messages.  I told her in the last message that I would try to reach her on 3/2 from the [facility] office.

## 2020-12-01 ENCOUNTER — Telehealth: Payer: Self-pay

## 2020-12-01 NOTE — Telephone Encounter (Signed)
Lpmtcb 3/2 @ 8:00am to discuss her call from 3/1.

## 2020-12-01 NOTE — Telephone Encounter (Signed)
lpmtcb @ 4:11 3/2

## 2020-12-02 ENCOUNTER — Other Ambulatory Visit: Payer: Self-pay

## 2020-12-02 ENCOUNTER — Ambulatory Visit (INDEPENDENT_AMBULATORY_CARE_PROVIDER_SITE_OTHER): Payer: Medicare Other | Admitting: Endocrinology

## 2020-12-02 ENCOUNTER — Encounter: Payer: Self-pay | Admitting: Endocrinology

## 2020-12-02 VITALS — BP 130/58 | HR 73 | Ht 63.0 in | Wt 242.6 lb

## 2020-12-02 DIAGNOSIS — E119 Type 2 diabetes mellitus without complications: Secondary | ICD-10-CM

## 2020-12-02 LAB — POCT GLYCOSYLATED HEMOGLOBIN (HGB A1C): Hemoglobin A1C: 7.1 % — AB (ref 4.0–5.6)

## 2020-12-02 MED ORDER — RYBELSUS 3 MG PO TABS: 3.0000 mg | ORAL_TABLET | Freq: Every day | ORAL | 11 refills | Status: DC

## 2020-12-02 NOTE — Progress Notes (Signed)
Subjective:    Patient ID: Debra Wright, female    DOB: [birth year], 70 y.o.   MRN: [id number]  HPI pt is referred by Dr Junius Roads, for diabetes.  Pt states DM was dx'ed in 2014; she is unaware of any chronic complications; she has never been on insulin; pt says her diet and exercise are poor; she has never had GDM, pancreatitis, pancreatic surgery, severe hypoglycemia or DKA.  She takes 2 oral meds.  She says cbg varies from 84-200.  She has mild hypoglycemia sxs approx once per week.  This happens before lunch.  Depression is well-controlled.   Past Medical History:  Diagnosis Date  . Anxiety   . Arthritis    "knees; right thumb" (10/14/2014)  . Basal cell carcinoma    "burned off upper lip & right shoulder"  . Chronic anticoagulation    CHADS VASC=3  . Complication of anesthesia    after spinal anesthesia for left knee replacement, bladder did "not wake up". Was incontinent for 4 days post surgery  . Depression   . Fibroid tumor   . Frequent diarrhea   . GERD (gastroesophageal reflux disease)   . High cholesterol   . Hypertension    EF 65-70% grade 2DD  . Insomnia   . Migraine    "frequently when I was younger; maybe monthly now" (10/14/2014) - none after hysterectomy  . PAF (paroxysmal atrial fibrillation) ([facility]) 05/02/2018   converted with rate control  . Pernicious anemia    pt not aware of this  . Sleep apnea    uses a cpap  . Type II diabetes mellitus ([facility])   . Vitamin B 12 deficiency     Past Surgical History:  Procedure Laterality Date  . ABDOMINAL HYSTERECTOMY  1988  . ACHILLES TENDON SURGERY Right   . APPENDECTOMY  1988  . BILATERAL OOPHORECTOMY  2004  . CARPAL TUNNEL RELEASE Bilateral 1986  . CATARACT EXTRACTION W/ INTRAOCULAR LENS  IMPLANT, BILATERAL Bilateral 2011  . COLONOSCOPY    . JOINT REPLACEMENT    . SHOULDER ARTHROSCOPY DISTAL CLAVICLE EXCISION AND OPEN ROTATOR CUFF REPAIR Right 11/2013  . TOENAIL EXCISION Right 04/2018   "big toe"  . TONSILLECTOMY   1950's  . TOTAL KNEE ARTHROPLASTY Left 10/13/2014   Procedure: LEFT TOTAL KNEE ARTHROPLASTY;  Surgeon: Mcarthur Rossetti, MD;  Location: [facility];  Service: Orthopedics;  Laterality: Left;  . TOTAL KNEE ARTHROPLASTY Right 12/10/2018   Procedure: RIGHT TOTAL KNEE ARTHROPLASTY;  Surgeon: Mcarthur Rossetti, MD;  Location: [facility];  Service: Orthopedics;  Laterality: Right;    Social History   Socioeconomic History  . Marital status: Married    Spouse name: Not on file  . Number of children: Not on file  . Years of education: Not on file  . Highest education level: Not on file  Occupational History  . Not on file  Tobacco Use  . Smoking status: Never Smoker  . Smokeless tobacco: Never Used  Vaping Use  . Vaping Use: Never used  Substance and Sexual Activity  . Alcohol use: Yes    Comment: 10/14/2014 "might have a drink when I'm out once/month"  . Drug use: No  . Sexual activity: Not Currently  Other Topics Concern  . 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 Blood Glucose Monitoring Suppl (ACCU-CHEK GUIDE ME) w/Device KIT USE AS DIRECTED.  Dx E11.9. 1 kit 11  . calcium carbonate (TUMS - DOSED IN MG ELEMENTAL CALCIUM) 500 MG chewable tablet Chew 2 tablets by mouth daily as needed for indigestion or heartburn.    . Calcium Carbonate-Vitamin D (CALCIUM 600 + D PO) Take 1 tablet by mouth daily.    Marland Kitchen CINNAMON PO Take 1 tablet by mouth daily.    . citalopram (CELEXA) 40 MG tablet Take 1 tablet (40 mg total) by mouth daily. 90 tablet 2  . clonazePAM (KLONOPIN) 0.5 MG tablet  TAKE 1/2 TO 1 (ONE-HALF TO ONE) TABLET BY MOUTH AT BEDTIME AS NEEDED 30 tablet 0  . clonazePAM (KLONOPIN) 0.5 MG tablet TAKE 1/2 TO 1 (ONE-HALF TO ONE) TABLET BY MOUTH AT BEDTIME AS NEEDED 30 tablet 0  . diltiazem (CARTIA XT) 180 MG 24 hr capsule Take 1 capsule (180 mg total) by mouth daily. 90 capsule 1  . Eszopiclone 3 MG TABS Take 1 tablet (3 mg total) by mouth at bedtime as needed. Take immediately before bedtime 30 tablet 1  . Eszopiclone 3 MG TABS Take 1 tablet (3 mg total) by mouth at bedtime as needed. Take immediately before bedtime 30 tablet 3  . fluconazole (DIFLUCAN) 150 MG tablet Take 1 tablet (150 mg total) by mouth every three (3) days as needed. 5 tablet 0  . glucose blood (ACCU-CHEK GUIDE) test strip CHECK BLOOD GLUCOSE IN THE MORNING AND AT BEDTIME.  E11.9 code. 100 strip 12  . magnesium oxide (MAG-OX) 400 (241.3 Mg) MG tablet Take 400 mg by mouth daily.    . metFORMIN (GLUCOPHAGE) 1000 MG tablet Take 1 tablet (1,000 mg total) by mouth 2 (two) times daily with a meal. 180 tablet 0  . metoprolol tartrate (LOPRESSOR) 25 MG tablet Take 1/2 (one-half) tablet by mouth twice daily 90 tablet 1  . nitrofurantoin, macrocrystal-monohydrate, (MACROBID) 100 MG capsule Take 1 capsule (100 mg total) by mouth 2 (two) times daily. 20 capsule 1  . Omega-3 Fatty Acids (OMEGA-3 FISH OIL) 1200 MG CAPS Take 2,400 mg by mouth daily.     Marland Kitchen omeprazole (PRILOSEC) 40 MG capsule Take 1 capsule (40 mg total) by mouth daily. 90 capsule 1  . rosuvastatin (CRESTOR) 20 MG tablet Take 1 tablet (20 mg total) by mouth daily. 90 tablet 1  . spironolactone (ALDACTONE) 25 MG tablet Take 1/2 (one-half) tablet by mouth once daily 45 tablet 3  . sulfamethoxazole-trimethoprim (BACTRIM DS) 800-160 MG tablet Take 1 tablet by mouth 2 (two) times daily. 20 tablet 1  . tiZANidine (ZANAFLEX) 2 MG tablet Take 1-2 tablets (2-4 mg total) by mouth every 6 (six) hours as needed for muscle spasms. 60 tablet 6  . tolterodine (DETROL  LA) 4 MG 24 hr capsule TAKE 2 CAPSULES BY MOUTH ONCE DAILY 180 capsule 3  . vitamin C (ASCORBIC ACID) 250 MG tablet Take 250 mg by mouth daily.    Marland Kitchen warfarin (COUMADIN) 5 MG tablet TAKE 1 TO  1 & 1/2 (ONE & ONE-HALF) TABLETS BY MOUTH ONCE DAILY AS DIRECTED BY  COUMADIN  CLINIC 90 tablet 1  . warfarin (  COUMADIN) 5 MG tablet TAKE 1 & 1/2 (ONE & ONE-HALF) TABLETS BY MOUTH ONCE DAILY AS DIRECTED BY  COUMADIN  CLINIC 90 tablet 1   No current facility-administered medications on file prior to visit.    Allergies  Allergen Reactions  . Antivert [Meclizine Hcl] Other (See Comments)    "makes me feel like my skin is falling off".  03/05/20 pt states she was placed on this a few months back and did not have a reaction    Family History  Problem Relation Age of Onset  . Arthritis Mother   . Hyperlipidemia Mother   . Diabetes Mother   . Hypertension Mother   . Arthritis Father   . Hyperlipidemia Father   . Heart disease Father   . Diabetes Father   . Stroke Brother   . Hypertension Brother   . Diabetes Brother   . Arthritis Maternal Grandmother   . Hyperlipidemia Maternal Grandmother   . Diabetes Maternal Grandmother   . Hypertension Maternal Grandmother   . Arthritis Maternal Grandfather   . Hyperlipidemia Maternal Grandfather   . Hypertension Maternal Grandfather   . Arthritis Paternal Grandmother   . Hyperlipidemia Paternal Grandmother   . Diabetes Paternal Grandmother   . Hypertension Paternal Grandmother   . Arthritis Paternal Grandfather   . Hyperlipidemia Paternal Grandfather   . Diabetes Paternal Grandfather   . Hypertension Paternal Grandfather     BP (!) 130/58 (BP Location: Right Arm, Patient Position: Sitting, Cuff Size: Large)   Pulse 73   Ht '5\' 3"'  (1.6 m)   Wt 242 lb 9.6 oz (110 kg)   SpO2 95%   BMI 42.97 kg/m    Review of Systems denies weight loss, blurry vision, chest pain, sob, n/v, urinary frequency, and memory loss.      Objective:   Physical  Exam VITAL SIGNS:  See vs page GENERAL: no distress Pulses: dorsalis pedis intact bilat.   MSK: no deformity of the feet CV: trace bilat leg edema Skin:  no ulcer on the feet.  normal color and temp on the feet.  Neuro: sensation is intact to touch on the feet  Lab Results  Component Value Date   HGBA1C 7.1 (A) 12/02/2020     Lab Results  Component Value Date   CREATININE 0.53 (L) 01/15/2020   BUN 8 01/15/2020   NA 141 01/15/2020   K 4.7 01/15/2020   CL 102 01/15/2020   CO2 26 01/15/2020    I have reviewed outside records, and summarized: Pt was noted to have elevated A1c, and referred here.  Sore throat and foot drop were also addressed.       Assessment & Plan:  Type 2 DM: uncontrolled.  She needs to phase out SU rx in order to safely lower A1c  Patient Instructions  good diet and exercise significantly improve the control of your diabetes.  please let me know if you wish to be referred to a dietician.  high blood sugar is very risky to your health.  you should see an eye doctor and dentist every year.  It is very important to get all recommended vaccinations.  Controlling your blood pressure and cholesterol drastically reduces the damage diabetes does to your body.  Those who smoke should quit.  Please discuss these with your doctor.  check your blood sugar once a day.  vary the time of day when you check, between before the 3 meals, and at bedtime.  also check if you have symptoms  of your blood sugar being too high or too low.  please keep a record of the readings and bring it to your next appointment here (or you can bring the meter itself).  You can write it on any piece of paper.  please call us sooner if your blood sugar goes below 70, or if most of your readings are over 200. Please continue the same metformin, and: I have sent a prescription to your pharmacy, to change glipizide to "Rybelsus."  Please call if this is too expensive, so we could change it to  "repaglinide."  Please come back for a follow-up appointment in 2 months.

## 2020-12-02 NOTE — Patient Instructions (Signed)
good diet and exercise significantly improve the control of your diabetes.  please let me know if you wish to be referred to a dietician.  high blood sugar is very risky to your health.  you should see an eye doctor and dentist every year.  It is very important to get all recommended vaccinations.  Controlling your blood pressure and cholesterol drastically reduces the damage diabetes does to your body.  Those who smoke should quit.  Please discuss these with your doctor.  check your blood sugar once a day.  vary the time of day when you check, between before the 3 meals, and at bedtime.  also check if you have symptoms of your blood sugar being too high or too low.  please keep a record of the readings and bring it to your next appointment here (or you can bring the meter itself).  You can write it on any piece of paper.  please call us sooner if your blood sugar goes below 70, or if most of your readings are over 200. Please continue the same metformin, and: I have sent a prescription to your pharmacy, to change glipizide to "Rybelsus."  Please call if this is too expensive, so we could change it to "repaglinide."  Please come back for a follow-up appointment in 2 months.

## 2020-12-03 MED ORDER — REPAGLINIDE 1 MG PO TABS: 1.0000 mg | ORAL_TABLET | Freq: Two times a day (BID) | ORAL | 3 refills | Status: DC

## 2020-12-06 ENCOUNTER — Telehealth: Payer: Self-pay

## 2020-12-06 NOTE — Telephone Encounter (Signed)
I spoke to the patient and will have her check INR on 3/8.  She misunderstood and did not check on 3/4.

## 2020-12-07 ENCOUNTER — Encounter: Payer: Self-pay | Admitting: Family Medicine

## 2020-12-09 ENCOUNTER — Other Ambulatory Visit: Payer: Self-pay

## 2020-12-09 ENCOUNTER — Other Ambulatory Visit (INDEPENDENT_AMBULATORY_CARE_PROVIDER_SITE_OTHER): Payer: Self-pay | Admitting: Family Medicine

## 2020-12-09 ENCOUNTER — Ambulatory Visit: Payer: Medicare Other

## 2020-12-09 DIAGNOSIS — Z760 Encounter for issue of repeat prescription: Secondary | ICD-10-CM

## 2020-12-09 DIAGNOSIS — R300 Dysuria: Secondary | ICD-10-CM

## 2020-12-10 ENCOUNTER — Ambulatory Visit: Payer: Medicare Other

## 2020-12-10 ENCOUNTER — Other Ambulatory Visit: Payer: Self-pay

## 2020-12-10 ENCOUNTER — Ambulatory Visit (INDEPENDENT_AMBULATORY_CARE_PROVIDER_SITE_OTHER): Payer: Medicare Other | Admitting: Pharmacist

## 2020-12-10 DIAGNOSIS — Z7901 Long term (current) use of anticoagulants: Secondary | ICD-10-CM | POA: Diagnosis not present

## 2020-12-10 DIAGNOSIS — I480 Paroxysmal atrial fibrillation: Secondary | ICD-10-CM

## 2020-12-10 DIAGNOSIS — R300 Dysuria: Secondary | ICD-10-CM | POA: Diagnosis not present

## 2020-12-10 LAB — POCT INR: INR: 2.9 (ref 2.0–3.0)

## 2020-12-12 ENCOUNTER — Encounter: Payer: Self-pay | Admitting: Family Medicine

## 2020-12-12 LAB — URINE CULTURE
MICRO NUMBER:: 11637630
SPECIMEN QUALITY:: ADEQUATE

## 2020-12-13 ENCOUNTER — Encounter: Payer: Self-pay | Admitting: Family Medicine

## 2020-12-13 ENCOUNTER — Telehealth: Payer: Self-pay | Admitting: Family Medicine

## 2020-12-13 DIAGNOSIS — N390 Urinary tract infection, site not specified: Secondary | ICD-10-CM

## 2020-12-13 DIAGNOSIS — B9689 Other specified bacterial agents as the cause of diseases classified elsewhere: Secondary | ICD-10-CM

## 2020-12-13 MED ORDER — LEVOFLOXACIN 500 MG PO TABS: 500.0000 mg | ORAL_TABLET | Freq: Every day | ORAL | 0 refills | Status: DC

## 2020-12-13 MED ORDER — LIDOCAINE VISCOUS HCL 2 % MT SOLN: 5.0000 mL | Freq: Four times a day (QID) | OROMUCOSAL | 3 refills | Status: DC | PRN

## 2020-12-13 NOTE — Telephone Encounter (Signed)
Urine grew klebsiella, resistant to oral antibiotics.  Will refer to infectious disease specialist.  Will try levaquin since it shows intermediate sensitivity.

## 2020-12-17 ENCOUNTER — Ambulatory Visit: Payer: Medicare Other | Admitting: Family Medicine

## 2020-12-17 ENCOUNTER — Encounter: Payer: Self-pay | Admitting: Family Medicine

## 2020-12-21 ENCOUNTER — Other Ambulatory Visit: Payer: Self-pay

## 2020-12-21 ENCOUNTER — Ambulatory Visit (INDEPENDENT_AMBULATORY_CARE_PROVIDER_SITE_OTHER): Payer: Medicare Other | Admitting: Infectious Diseases

## 2020-12-21 ENCOUNTER — Encounter: Payer: Self-pay | Admitting: Infectious Diseases

## 2020-12-21 VITALS — BP 133/68 | HR 79 | Temp 98.8°F | Wt 239.0 lb

## 2020-12-21 DIAGNOSIS — N390 Urinary tract infection, site not specified: Secondary | ICD-10-CM | POA: Diagnosis not present

## 2020-12-21 DIAGNOSIS — N3281 Overactive bladder: Secondary | ICD-10-CM

## 2020-12-21 NOTE — Progress Notes (Signed)
[facility]                                                             [place], [place], [place], [address]                                                                  Phn. [phone]; Fax: [phone]                                                                             Date: 12/21/20  Reason for Referral: ESBL UTI  Requesting  Provider: Eunice Blase   Assessment 70 Year old female with a PMH as below including DM, HTN, HLD, PAF on chronic anticoagulation, Bilateral Knee replacement who is referred by PCP for evaluation of urine cx growing ESBL Klebsiella pnuemoniae on 12/10/20.   She seems to be have been treated with a course of Bactrim in November for Urine cx growing E coli. UA at that time had only 0-5 WBCs, LE 1+, negative nitrites. She seems to have received at least 4 courses of different antibiotics as listed below ( 2 courses of Bactrim, followed by macrobid and levofloxacin as of today). She did not have any recent UA. She does not have usual risk factors for recurrent UTI like stones, stent or urinary catheter. She does have h/o DM which can contribute to bacteruria.   She is also being treated for overactive bladder by PCP with Tolterodine.   I am not sure if her urinary burning/frequnecy is related to overactive bladder or related bladdder pathology or recurrent UTI. I have low suspicion for this being a recurrent UTI given she does not have much risk factors to have recurrent UTI. I think it would be helpful for her to be seen by Urologist for possible need of cystoscopy or other bladder studies.  Given she does not have any urinary symptoms, do not need further antibiotics.  Fu with me as needed pending US kidney/ureter and bladder.   Orders Placed This Encounter  Procedures  . US RENAL  . Ambulatory referral to Urology  . [place], retroperitnl abd,  ltd  . [place],  retroperitnl abd,  ltd   All questions and concerns were discussed and addressed. Patient verbalized understanding of the plan. ____________________________________________________________________________________________________________________  HPI: 70 Year old female with a PMH as below including DM, HTN, HLD, PAF on chronic anticoagulation, Bilateral Knee replacement who is referred by PCP for evaluation of urine cx growing ESBL Klebsiella pnuemoniae on 12/10/20.   She says her last UTI was 45 years ago and she never had an UTI until recently 2 months ago. She has already completed 2 courses of bactrim each for 7-10 days followed by 3-4 days courses of Macrobid  followed by course of levaquin for 10 days which she completed 3 days ago.   She describes her urinary symptoms as burning associated with increased frequency of urination. She also noticed her urine was also cloudy on one of those episodes. Denies having fevers, chills and sweats. Had some nausea/vomiting. Denies diarrhea.  Denied having flank pain, back pain or suprapubic pain. Denies hematuria. She says her UTI almost always follows after URTI symptoms. Denies having uretheral catheter, urinary stones or stent in the urinary tract. She has urinary burning going on since her UTI episodes started. She says all these symptoms have resolved now.   She is also taking tolterodine for overactive bladder. She has not been seen by a Urologist though.  She also developed oral thrush after being treated with multiple episodes of UTI with antibiotics for which she was treated with 2 weeks course of Fluconazole and nystatin. Oral thrush is resolved but complains of raspy voice.  ROS: Constitutional: Negative for fever, chills, activity change, appetite change, fatigue and unexpected weight change.  HENT: Negative for congestion, sore throat, rhinorrhea, sneezing, trouble swallowing and sinus pressure.  Eyes: Negative for photophobia and visual  disturbance.  Respiratory: Negative for cough, chest tightness, shortness of breath, wheezing and stridor.  Cardiovascular: Negative for chest pain, palpitations and leg swelling.  Gastrointestinal: Negative for nausea, vomiting, abdominal pain, diarrhea, constipation, blood in stool, abdominal distention and anal bleeding.  Genitourinary: Negative for dysuria, hematuria, flank pain and difficulty urinating.  Musculoskeletal: Negative for myalgias, back pain, joint swelling, arthralgias and gait problem.  Skin: Negative for color change, pallor, rash and wound.  Neurological: Negative for dizziness, tremors, weakness and light-headedness.  Hematological: Negative for adenopathy. Does not bruise/bleed easily.  Psychiatric/Behavioral: Negative for behavioral problems, confusion, sleep disturbance, dysphoric mood, decreased concentration and agitation.   Past Medical History:  Diagnosis Date  . Anxiety   . Arthritis    "knees; right thumb" (10/14/2014)  . Basal cell carcinoma    "burned off upper lip & right shoulder"  . Chronic anticoagulation    CHADS VASC=3  . Complication of anesthesia    after spinal anesthesia for left knee replacement, bladder did "not wake up". Was incontinent for 4 days post surgery  . Depression   . Fibroid tumor   . Frequent diarrhea   . GERD (gastroesophageal reflux disease)   . High cholesterol   . Hypertension    EF 65-70% grade 2DD  . Insomnia   . Migraine    "frequently when I was younger; maybe monthly now" (10/14/2014) - none after hysterectomy  . PAF (paroxysmal atrial fibrillation) ([place]) 05/02/2018   converted with rate control  . Pernicious anemia    pt not aware of this  . Sleep apnea    uses a cpap  . Type II diabetes mellitus ([place])   . Vitamin B 12 deficiency    Past Surgical History:  Procedure Laterality Date  . ABDOMINAL HYSTERECTOMY  1988  . ACHILLES TENDON SURGERY Right   . APPENDECTOMY  1988  . BILATERAL OOPHORECTOMY  2004  .  CARPAL TUNNEL RELEASE Bilateral 1986  . CATARACT EXTRACTION W/ INTRAOCULAR LENS  IMPLANT, BILATERAL Bilateral 2011  . COLONOSCOPY    . JOINT REPLACEMENT    . SHOULDER ARTHROSCOPY DISTAL CLAVICLE EXCISION AND OPEN ROTATOR CUFF REPAIR Right 11/2013  . TOENAIL EXCISION Right 04/2018   "big toe"  . TONSILLECTOMY  1950's  . TOTAL KNEE ARTHROPLASTY Left 10/13/2014   Procedure: LEFT TOTAL KNEE ARTHROPLASTY;  Surgeon: Mcarthur Rossetti, MD;  Location: [facility];  Service: Orthopedics;  Laterality: Left;  . TOTAL KNEE ARTHROPLASTY Right 12/10/2018   Procedure: RIGHT TOTAL KNEE ARTHROPLASTY;  Surgeon: Mcarthur Rossetti, MD;  Location: [facility];  Service: Orthopedics;  Laterality: Right;   Current Outpatient Medications on File Prior to Visit  Medication Sig Dispense Refill  . Blood Glucose Monitoring Suppl (ACCU-CHEK GUIDE ME) w/Device KIT USE AS DIRECTED.  Dx E11.9. 1 kit 11  . calcium carbonate (TUMS - DOSED IN MG ELEMENTAL CALCIUM) 500 MG chewable tablet Chew 2 tablets by mouth daily as needed for indigestion or heartburn.    . Calcium Carbonate-Vitamin D (CALCIUM 600 + D PO) Take 1 tablet by mouth daily.    Marland Kitchen CINNAMON PO Take 1 tablet by mouth daily.    . citalopram (CELEXA) 40 MG tablet Take 1 tablet (40 mg total) by mouth daily. 90 tablet 2  . clonazePAM (KLONOPIN) 0.5 MG tablet TAKE 1/2 TO 1 (ONE-HALF TO ONE) TABLET BY MOUTH AT BEDTIME AS NEEDED 30 tablet 0  . clonazePAM (KLONOPIN) 0.5 MG tablet TAKE 1/2 TO 1 (ONE-HALF TO ONE) TABLET BY MOUTH AT BEDTIME AS NEEDED 30 tablet 0  . diltiazem (CARTIA XT) 180 MG 24 hr capsule Take 1 capsule (180 mg total) by mouth daily. 90 capsule 1  . Eszopiclone 3 MG TABS Take 1 tablet (3 mg total) by mouth at bedtime as needed. Take immediately before bedtime 30 tablet 1  . Eszopiclone 3 MG TABS Take 1 tablet (3 mg total) by mouth at bedtime as needed. Take immediately before bedtime 30 tablet 3  . fluconazole (DIFLUCAN) 150 MG tablet Take 1 tablet (150 mg  total) by mouth every three (3) days as needed. 5 tablet 0  . glucose blood (ACCU-CHEK GUIDE) test strip CHECK BLOOD GLUCOSE IN THE MORNING AND AT BEDTIME.  E11.9 code. 100 strip 12  . levofloxacin (LEVAQUIN) 500 MG tablet Take 1 tablet (500 mg total) by mouth daily. 10 tablet 0  . magic mouthwash (lidocaine, diphenhydrAMINE, alum & mag hydroxide) suspension Swish and spit 5 mLs 4 (four) times daily as needed for mouth pain. 360 mL 3  . magnesium oxide (MAG-OX) 400 (241.3 Mg) MG tablet Take 400 mg by mouth daily.    . metFORMIN (GLUCOPHAGE) 1000 MG tablet Take 1 tablet (1,000 mg total) by mouth 2 (two) times daily with a meal. 180 tablet 0  . metoprolol tartrate (LOPRESSOR) 25 MG tablet Take 1/2 (one-half) tablet by mouth twice daily 90 tablet 1  . nitrofurantoin, macrocrystal-monohydrate, (MACROBID) 100 MG capsule Take 1 capsule (100 mg total) by mouth 2 (two) times daily. 20 capsule 1  . Omega-3 Fatty Acids (OMEGA-3 FISH OIL) 1200 MG CAPS Take 2,400 mg by mouth daily.     Marland Kitchen omeprazole (PRILOSEC) 40 MG capsule Take 1 capsule by mouth once daily 90 capsule 0  . repaglinide (PRANDIN) 1 MG tablet Take 1 tablet (1 mg total) by mouth 2 (two) times daily before a meal. Breakfast and supper 180 tablet 3  . rosuvastatin (CRESTOR) 20 MG tablet Take 1 tablet (20 mg total) by mouth daily. 90 tablet 1  . spironolactone (ALDACTONE) 25 MG tablet Take 1/2 (one-half) tablet by mouth once daily 45 tablet 3  . sulfamethoxazole-trimethoprim (BACTRIM DS) 800-160 MG tablet Take 1 tablet by mouth 2 (two) times daily. 20 tablet 1  . tiZANidine (ZANAFLEX) 2 MG tablet Take 1-2 tablets (2-4 mg total) by mouth every 6 (six) hours as  needed for muscle spasms. 60 tablet 6  . tolterodine (DETROL LA) 4 MG 24 hr capsule TAKE 2 CAPSULES BY MOUTH ONCE DAILY 180 capsule 3  . vitamin C (ASCORBIC ACID) 250 MG tablet Take 250 mg by mouth daily.    Marland Kitchen warfarin (COUMADIN) 5 MG tablet TAKE 1 TO  1 & 1/2 (ONE & ONE-HALF) TABLETS BY MOUTH  ONCE DAILY AS DIRECTED BY  COUMADIN  CLINIC 90 tablet 1  . warfarin (COUMADIN) 5 MG tablet TAKE 1 & 1/2 (ONE & ONE-HALF) TABLETS BY MOUTH ONCE DAILY AS DIRECTED BY  COUMADIN  CLINIC 90 tablet 1   No current facility-administered medications on file prior to visit.   Allergies  Allergen Reactions  . Antivert [Meclizine Hcl] Other (See Comments)    "makes me feel like my skin is falling off".  03/05/20 pt states she was placed on this a few months back and did not have a reaction   Social History   Socioeconomic History  . Marital status: Married    Spouse name: Not on file  . Number of children: Not on file  . Years of education: Not on file  . Highest education level: Not on file  Occupational History  . Not on file  Tobacco Use  . Smoking status: Never Smoker  . Smokeless tobacco: Never Used  Vaping Use  . Vaping Use: Never used  Substance and Sexual Activity  . Alcohol use: Yes    Comment: 10/14/2014 "might have a drink when I'm out once/month"  . Drug use: No  . Sexual activity: Not Currently  Other Topics Concern  . 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, sports for 52   Married       Social Determinants of Radio broadcast assistant Strain: Not on [employer] Insecurity: Not on file  Transportation Needs: Not on file  Physical Activity: Not on file  Stress: Not on file  Social Connections: Not on file  Intimate Partner Violence: Not on file     Vitals BP 133/68   Pulse 79   Temp 98.8 F (37.1 C) (Oral)   Wt 239 lb (108.4 kg)   BMI 42.34 kg/m     Examination  General - not in acute distress, comfortably sitting in chair, OBESE  HEENT - PEERLA, no pallor and no icterus Chest - b/l clear air entry, no additional sounds CVS- Normal s1s2, RRR Abdomen - Soft, Non tender , non distended, NO SUPRAPUBIC TENDERNESS, NO CVA TENDERNESS Ext- no pedal edema Neuro: grossly normal Back - WNL Psych : calm and cooperative   Recent  labs CBC Latest Ref Rng & Units 11/26/2019 07/04/2019 03/04/2019  WBC 3.8 - 10.8 Thousand/uL 6.5 8.3 6.9  Hemoglobin 11.7 - 15.5 g/dL 13.0 12.4 10.2(L)  Hematocrit 35.0 - 45.0 % 39.5 39.1 33.7(L)  Platelets 140 - 400 Thousand/uL 246 283 222   CMP Latest Ref Rng & Units 01/15/2020 11/26/2019 09/19/2019  Glucose 65 - 99 mg/dL 215(H) 157(H) 110(H)  BUN 8 - 27 mg/dL _0 Creatinine 0.57 - 1.00 mg/dL 0.53(L) 0.59(L) 0.70  Sodium 134 - 144 mmol/L 141 137 139  Potassium 3.5 - 5.2 mmol/L 4.7 4.7 5.0  Chloride 96 - 106 mmol/L 102 98 99  CO2 20 - 29 mmol/L 26 32 25  Calcium 8.7 - 10.3 mg/dL 9.0 9.3 9.7  Total Protein 6.1 - 8.1 g/dL - 5.9(L) -  Total  Bilirubin 0.2 - 1.2 mg/dL - 0.4 -  Alkaline Phos 39 - 117 U/L - - -  AST 10 - 35 U/L - 12 -  ALT 6 - 29 U/L - 17 -     Pertinent Microbiology Results for orders placed or performed in visit on 12/09/20  Urine Culture     Status: Abnormal   Collection Time: 12/10/20  2:40 PM   Specimen: Urine  Result Value Ref Range Status   MICRO NUMBER: [id number]  Final   SPECIMEN QUALITY: Adequate  Final   Sample Source NOT GIVEN  Final   STATUS: FINAL  Final   ISOLATE 1: ESBL Klebsiella pneumoniae (A)  Final    Comment: 50,000-100,000 CFU/mL of Klebsiella pneumoniae (ESBL) ESBL RESULT:        The organism has been confirmed as an ESBL producer.      Susceptibility   Esbl klebsiella pneumoniae - URINE CULTURE, REFLEX    AMOX/CLAVULANIC 8 Sensitive     AMPICILLIN* >=32 Resistant      * Extended spectrum beta-lactamase (ESBL) producingorganisms demonstrate decreased activity withpenicillins, cephalosporins and aztreonam.    AMPICILLIN/SULBACTAM >=32 Resistant     CEFAZOLIN* >=64 Resistant      * Extended spectrum beta-lactamase (ESBL) producingorganisms demonstrate decreased activity withpenicillins, cephalosporins and aztreonam.For uncomplicated UTI caused by E. coli,K. pneumoniae or P. mirabilis: Cefazolin issusceptible if MIC <32 mcg/mL and  predictssusceptible to the oral agents cefaclor, cefdinir,cefpodoxime, cefprozil, cefuroxime, cephalexinand loracarbef.    CEFEPIME 2 Resistant     CEFTRIAXONE >=64 Resistant     CIPROFLOXACIN 1 Resistant     LEVOFLOXACIN 1 Intermediate     ERTAPENEM <=0.5 Sensitive     GENTAMICIN >=16 Resistant     IMIPENEM <=0.25 Sensitive     NITROFURANTOIN 64 Intermediate     PIP/TAZO <=4 Sensitive     TOBRAMYCIN 8 Intermediate     TRIMETH/SULFA* >=320 Resistant      * Extended spectrum beta-lactamase (ESBL) producingorganisms demonstrate decreased activity withpenicillins, cephalosporins and aztreonam.For uncomplicated UTI caused by E. coli,K. pneumoniae or P. mirabilis: Cefazolin issusceptible if MIC <32 mcg/mL and predictssusceptible to the oral agents cefaclor, cefdinir,cefpodoxime, cefprozil, cefuroxime, cephalexinand loracarbef.Legend:S = Susceptible  I = IntermediateR = Resistant  NS = Not susceptible* = Not tested  NR = Not reported**NN = See antimicrobic comments      Pertinent Imaging All pertinent labs/Imagings/notes reviewed. All pertinent plain films and CT images have been personally visualized and interpreted; radiology reports have been reviewed. Decision making incorporated into the Impression / Recommendations.  I have spent 60 minutes for this patient encounter including  review of prior medical records with greater than 50% of time in face to face counsel of the patient/discussing diagnostics and plan of care.   Electronically signed by:  Rosiland Oz, MD Infectious Disease Physician [facility] Infect[facility] Disease [address] [place],  [address] Phone: [phone]  Fax: [phone]

## 2020-12-23 ENCOUNTER — Ambulatory Visit (INDEPENDENT_AMBULATORY_CARE_PROVIDER_SITE_OTHER): Payer: Medicare Other | Admitting: Cardiovascular Disease

## 2020-12-23 DIAGNOSIS — Z7901 Long term (current) use of anticoagulants: Secondary | ICD-10-CM | POA: Diagnosis not present

## 2020-12-23 LAB — POCT INR: INR: 8 — AB (ref 2.0–3.0)

## 2020-12-24 ENCOUNTER — Ambulatory Visit (INDEPENDENT_AMBULATORY_CARE_PROVIDER_SITE_OTHER): Payer: Medicare Other | Admitting: Pharmacist Clinician (PhC)/ Clinical Pharmacy Specialist

## 2020-12-24 DIAGNOSIS — I480 Paroxysmal atrial fibrillation: Secondary | ICD-10-CM | POA: Diagnosis not present

## 2020-12-24 DIAGNOSIS — Z7901 Long term (current) use of anticoagulants: Secondary | ICD-10-CM | POA: Diagnosis not present

## 2020-12-24 LAB — POCT INR: INR: 8 — AB (ref 2.0–3.0)

## 2020-12-27 ENCOUNTER — Ambulatory Visit (INDEPENDENT_AMBULATORY_CARE_PROVIDER_SITE_OTHER): Payer: Medicare Other | Admitting: Cardiology

## 2020-12-27 DIAGNOSIS — I480 Paroxysmal atrial fibrillation: Secondary | ICD-10-CM | POA: Diagnosis not present

## 2020-12-27 DIAGNOSIS — Z7901 Long term (current) use of anticoagulants: Secondary | ICD-10-CM | POA: Diagnosis not present

## 2020-12-27 LAB — POCT INR: INR: 2.4 (ref 2.0–3.0)

## 2020-12-28 ENCOUNTER — Encounter: Payer: Self-pay | Admitting: Family Medicine

## 2020-12-28 ENCOUNTER — Other Ambulatory Visit: Payer: Self-pay

## 2020-12-28 ENCOUNTER — Ambulatory Visit (HOSPITAL_COMMUNITY)
Admission: RE | Admit: 2020-12-28 | Discharge: 2020-12-28 | Disposition: A | Discharge status: Home / Self Care | Payer: Medicare Other | Source: Ambulatory Visit | Attending: Infectious Diseases | Admitting: Infectious Diseases

## 2020-12-28 DIAGNOSIS — N390 Urinary tract infection, site not specified: Secondary | ICD-10-CM | POA: Insufficient documentation

## 2020-12-28 DIAGNOSIS — B9689 Other specified bacterial agents as the cause of diseases classified elsewhere: Secondary | ICD-10-CM

## 2020-12-30 DIAGNOSIS — M5416 Radiculopathy, lumbar region: Secondary | ICD-10-CM | POA: Diagnosis not present

## 2020-12-31 NOTE — Addendum Note (Signed)
Addended by: Hortencia Pilar on: 12/31/2020 08:03 AM   Modules accepted: Orders

## 2021-01-03 MED ORDER — ROSUVASTATIN CALCIUM 20 MG PO TABS: 20.0000 mg | ORAL_TABLET | Freq: Every day | ORAL | 1 refills | Status: AC

## 2021-01-03 MED ORDER — DILTIAZEM HCL ER COATED BEADS 180 MG PO CP24: 180.0000 mg | ORAL_CAPSULE | Freq: Every day | ORAL | 1 refills | Status: DC

## 2021-01-04 ENCOUNTER — Ambulatory Visit: Payer: Medicare Other | Admitting: Physician Assistant

## 2021-01-10 ENCOUNTER — Encounter: Payer: Self-pay | Admitting: Family Medicine

## 2021-01-10 MED ORDER — CLONAZEPAM 0.5 MG PO TABS: ORAL_TABLET | ORAL | 0 refills | Status: DC

## 2021-01-11 ENCOUNTER — Ambulatory Visit (INDEPENDENT_AMBULATORY_CARE_PROVIDER_SITE_OTHER): Payer: Medicare Other | Admitting: Cardiovascular Disease

## 2021-01-11 DIAGNOSIS — Z7901 Long term (current) use of anticoagulants: Secondary | ICD-10-CM | POA: Diagnosis not present

## 2021-01-11 DIAGNOSIS — I480 Paroxysmal atrial fibrillation: Secondary | ICD-10-CM

## 2021-01-11 LAB — POCT INR: INR: 5.1 — AB (ref 2.0–3.0)

## 2021-01-17 DIAGNOSIS — Z1231 Encounter for screening mammogram for malignant neoplasm of breast: Secondary | ICD-10-CM | POA: Diagnosis not present

## 2021-01-19 ENCOUNTER — Telehealth: Payer: Self-pay

## 2021-01-19 NOTE — Telephone Encounter (Signed)
lmom for overdue inr self tester   

## 2021-01-21 ENCOUNTER — Ambulatory Visit (INDEPENDENT_AMBULATORY_CARE_PROVIDER_SITE_OTHER): Payer: Medicare Other | Admitting: Cardiovascular Disease

## 2021-01-21 ENCOUNTER — Telehealth: Payer: Self-pay | Admitting: Cardiovascular Disease

## 2021-01-21 DIAGNOSIS — Z7901 Long term (current) use of anticoagulants: Secondary | ICD-10-CM | POA: Diagnosis not present

## 2021-01-21 LAB — POCT INR: INR: 1.4 — AB (ref 2.0–3.0)

## 2021-01-21 NOTE — Telephone Encounter (Signed)
I spoke to Herbie Baltimore from [place] and mentioned that I had received a call from the patient and discussed INR result/recommendation.

## 2021-01-21 NOTE — Telephone Encounter (Signed)
Debra Wright is calling to report critical results.

## 2021-01-25 DIAGNOSIS — I10 Essential (primary) hypertension: Secondary | ICD-10-CM | POA: Diagnosis not present

## 2021-01-25 DIAGNOSIS — E7800 Pure hypercholesterolemia, unspecified: Secondary | ICD-10-CM | POA: Diagnosis not present

## 2021-01-25 DIAGNOSIS — M5136 Other intervertebral disc degeneration, lumbar region: Secondary | ICD-10-CM | POA: Diagnosis not present

## 2021-01-25 DIAGNOSIS — I480 Paroxysmal atrial fibrillation: Secondary | ICD-10-CM | POA: Diagnosis not present

## 2021-01-25 DIAGNOSIS — J040 Acute laryngitis: Secondary | ICD-10-CM | POA: Diagnosis not present

## 2021-01-25 DIAGNOSIS — E1169 Type 2 diabetes mellitus with other specified complication: Secondary | ICD-10-CM | POA: Diagnosis not present

## 2021-01-25 DIAGNOSIS — F419 Anxiety disorder, unspecified: Secondary | ICD-10-CM | POA: Diagnosis not present

## 2021-01-25 DIAGNOSIS — K219 Gastro-esophageal reflux disease without esophagitis: Secondary | ICD-10-CM | POA: Diagnosis not present

## 2021-01-25 DIAGNOSIS — F410 Panic disorder [episodic paroxysmal anxiety] without agoraphobia: Secondary | ICD-10-CM | POA: Diagnosis not present

## 2021-01-25 DIAGNOSIS — G4733 Obstructive sleep apnea (adult) (pediatric): Secondary | ICD-10-CM | POA: Diagnosis not present

## 2021-01-25 DIAGNOSIS — F5101 Primary insomnia: Secondary | ICD-10-CM | POA: Diagnosis not present

## 2021-01-25 DIAGNOSIS — D6869 Other thrombophilia: Secondary | ICD-10-CM | POA: Diagnosis not present

## 2021-01-27 ENCOUNTER — Telehealth: Payer: Self-pay

## 2021-01-27 NOTE — Telephone Encounter (Signed)
Okay to take as prescribed, but needs INR tomorrow April/29

## 2021-01-27 NOTE — Telephone Encounter (Signed)
Called and lmom pt to complete inr tomorrow

## 2021-01-27 NOTE — Telephone Encounter (Signed)
called in to report that they are taking prednisone 40 mg and they are an warfarin and wanna make sure that its ok

## 2021-01-28 ENCOUNTER — Ambulatory Visit (INDEPENDENT_AMBULATORY_CARE_PROVIDER_SITE_OTHER): Payer: Medicare Other | Admitting: Pharmacist

## 2021-01-28 DIAGNOSIS — Z7901 Long term (current) use of anticoagulants: Secondary | ICD-10-CM | POA: Diagnosis not present

## 2021-01-28 DIAGNOSIS — I480 Paroxysmal atrial fibrillation: Secondary | ICD-10-CM

## 2021-01-28 LAB — POCT INR: INR: 2.9 (ref 2.0–3.0)

## 2021-02-01 DIAGNOSIS — M25552 Pain in left hip: Secondary | ICD-10-CM | POA: Diagnosis not present

## 2021-02-01 DIAGNOSIS — M5136 Other intervertebral disc degeneration, lumbar region: Secondary | ICD-10-CM | POA: Diagnosis not present

## 2021-02-02 ENCOUNTER — Telehealth: Payer: Self-pay

## 2021-02-02 NOTE — Telephone Encounter (Signed)
   [facility] Pre-operative Risk Assessment    Patient Name: Debra Wright  [birth year]   Request for surgical clearance:  1. What type of surgery is being performed? ESI L5-S1   2. When is this surgery scheduled? TBD   3. What type of clearance is required (medical clearance vs. Pharmacy clearance to hold med vs. Both)? MEDICATION  4. Are there any medications that need to be held prior to surgery and how long? WARFARIN 5 DAYS PRIOR   5. Practice name and name of physician performing surgery? [facility] [id number]   6. What is the office phone number? [phone]   7.   What is the office fax number? [phone]  8.   Anesthesia type (None, local, MAC, general) ? NOT LISTED

## 2021-02-02 NOTE — Telephone Encounter (Signed)
Patient with diagnosis of afib on warfarin for anticoagulation.    Procedure: ESI L5-S1 Date of procedure: TBD  CHA2DS2-VASc Score = 4  This indicates a 4.8% annual risk of stroke. The patient's score is based upon: CHF History: No HTN History: Yes Diabetes History: Yes Stroke History: No Vascular Disease History: No Age Score: 1 Gender Score: 1     Per office protocol, patient can hold warfarin for 5 days prior to procedure.   Patient does NOT need bridging with Lovenox (enoxaparin) around procedure.

## 2021-02-03 ENCOUNTER — Ambulatory Visit: Payer: Medicare Other | Admitting: Endocrinology

## 2021-02-03 NOTE — Telephone Encounter (Signed)
    Debra Wright DOB:  March 31, 1951  MRN:  [id number]   Primary Cardiologist: Shelva Majestic, MD  Chart reviewed as part of pre-operative protocol coverage.   Per pharmacy recommendations, patient can hold coumadin 5 days prior to her upcoming spinal procedure with plans to restart when cleared to do so by her surgeon. She does NOT require lovenox bridging.   I will route this recommendation to the requesting party via Epic fax function and remove from pre-op pool.  Please call with questions.  Abigail Butts, PA-C 02/03/2021, 12:13 PM

## 2021-02-04 ENCOUNTER — Ambulatory Visit (INDEPENDENT_AMBULATORY_CARE_PROVIDER_SITE_OTHER): Payer: Medicare Other | Admitting: Cardiology

## 2021-02-04 DIAGNOSIS — Z7901 Long term (current) use of anticoagulants: Secondary | ICD-10-CM

## 2021-02-04 DIAGNOSIS — I480 Paroxysmal atrial fibrillation: Secondary | ICD-10-CM | POA: Diagnosis not present

## 2021-02-04 LAB — POCT INR: INR: 2.8 (ref 2.0–3.0)

## 2021-02-07 ENCOUNTER — Other Ambulatory Visit: Payer: Self-pay

## 2021-02-07 ENCOUNTER — Ambulatory Visit (INDEPENDENT_AMBULATORY_CARE_PROVIDER_SITE_OTHER): Payer: Medicare Other | Admitting: Endocrinology

## 2021-02-07 VITALS — BP 140/60 | HR 64 | Ht 63.0 in | Wt 240.4 lb

## 2021-02-07 DIAGNOSIS — E119 Type 2 diabetes mellitus without complications: Secondary | ICD-10-CM

## 2021-02-07 LAB — POCT GLYCOSYLATED HEMOGLOBIN (HGB A1C): Hemoglobin A1C: 8 % — AB (ref 4.0–5.6)

## 2021-02-07 MED ORDER — REPAGLINIDE 2 MG PO TABS: 2.0000 mg | ORAL_TABLET | Freq: Two times a day (BID) | ORAL | 3 refills | Status: DC

## 2021-02-07 NOTE — Progress Notes (Signed)
Subjective:    Patient ID: Debra Wright, female    DOB: 02-10-1951, 70 y.o.   MRN: [id number]  HPI Pt returns for f/u of diabetes mellitus: DM type: 2 Dx'ed: 2751 Complications: none Therapy: 2 oral meds GDM: never DKA: never Severe hypoglycemia: never Pancreatitis: never Pancreatic imaging: normal on 2003 CT SDOH: she cannot afford brand name meds Other: she has never been on insulin Interval history: she sometimes misses the evening dose of repaglinide.  she brings her meter with her cbg's which I have reviewed today.  cbg varies from 77-343.  It is in general higher as the day goes on.     Past Surgical History:  Procedure Laterality Date  . ABDOMINAL HYSTERECTOMY  1988  . ACHILLES TENDON SURGERY Right   . APPENDECTOMY  1988  . BILATERAL OOPHORECTOMY  2004  . CARPAL TUNNEL RELEASE Bilateral 1986  . CATARACT EXTRACTION W/ INTRAOCULAR LENS  IMPLANT, BILATERAL Bilateral 2011  . COLONOSCOPY    . JOINT REPLACEMENT    . SHOULDER ARTHROSCOPY DISTAL CLAVICLE EXCISION AND OPEN ROTATOR CUFF REPAIR Right 11/2013  . TOENAIL EXCISION Right 04/2018   "big toe"  . TONSILLECTOMY  1950's  . TOTAL KNEE ARTHROPLASTY Left 10/13/2014   Procedure: LEFT TOTAL KNEE ARTHROPLASTY;  Surgeon: Mcarthur Rossetti, MD;  Location: [place];  Service: Orthopedics;  Laterality: Left;  . TOTAL KNEE ARTHROPLASTY Right 12/10/2018   Procedure: RIGHT TOTAL KNEE ARTHROPLASTY;  Surgeon: Mcarthur Rossetti, MD;  Location: [place];  Service: Orthopedics;  Laterality: Right;    Social History   Socioeconomic History  . Marital status: Married    Spouse name: Not on file  . Number of children: Not on file  . Years of education: Not on file  . Highest education level: Not on file  Occupational History  . Not on file  Tobacco Use  . Smoking status: Never Smoker  . Smokeless tobacco: Never Used  Vaping Use  . Vaping Use: Never used  Substance and Sexual Activity  . Alcohol use: Yes    Comment:  10/14/2014 "might have a drink when I'm out once/month"  . Drug use: No  . Sexual activity: Not Currently  Other Topics Concern  . 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 Blood Glucose Monitoring Suppl (ACCU-CHEK GUIDE ME) w/Device KIT USE AS DIRECTED.  Dx E11.9. 1 kit 11  . calcium carbonate (TUMS - DOSED IN MG ELEMENTAL CALCIUM) 500 MG chewable tablet Chew 2 tablets by mouth daily as needed for indigestion or heartburn.    . Calcium Carbonate-Vitamin D (CALCIUM 600 + D PO) Take 1 tablet by mouth daily.    Marland Kitchen CINNAMON PO Take 1 tablet by mouth daily.    . citalopram (CELEXA) 40 MG tablet Take 1 tablet (40 mg total) by mouth daily. 90 tablet 2  . clonazePAM (KLONOPIN) 0.5 MG tablet TAKE 1/2 TO 1 (ONE-HALF TO ONE) TABLET BY MOUTH AT BEDTIME AS NEEDED 30 tablet 0  .  clonazePAM (KLONOPIN) 0.5 MG tablet TAKE 1/2 TO 1 (ONE-HALF TO ONE) TABLET BY MOUTH AT BEDTIME AS NEEDED 30 tablet 0  . diltiazem (CARTIA XT) 180 MG 24 hr capsule Take 1 capsule (180 mg total) by mouth daily. 90 capsule 1  . Eszopiclone 3 MG TABS Take 1 tablet (3 mg total) by mouth at bedtime as needed. Take immediately before bedtime 30 tablet 1  . Eszopiclone 3 MG TABS Take 1 tablet (3 mg total) by mouth at bedtime as needed. Take immediately before bedtime 30 tablet 3  . fluconazole (DIFLUCAN) 150 MG tablet Take 1 tablet (150 mg total) by mouth every three (3) days as needed. 5 tablet 0  . glucose blood (ACCU-CHEK GUIDE) test strip CHECK BLOOD GLUCOSE IN THE MORNING AND AT BEDTIME.  E11.9 code. 100 strip 12  . magic  mouthwash (lidocaine, diphenhydrAMINE, alum & mag hydroxide) suspension Swish and spit 5 mLs 4 (four) times daily as needed for mouth pain. 360 mL 3  . magnesium oxide (MAG-OX) 400 (241.3 Mg) MG tablet Take 400 mg by mouth daily.    . metFORMIN (GLUCOPHAGE) 1000 MG tablet Take 1 tablet (1,000 mg total) by mouth 2 (two) times daily with a meal. 180 tablet 0  . metoprolol tartrate (LOPRESSOR) 25 MG tablet Take 1/2 (one-half) tablet by mouth twice daily 90 tablet 1  . Omega-3 Fatty Acids (OMEGA-3 FISH OIL) 1200 MG CAPS Take 2,400 mg by mouth daily.     Marland Kitchen omeprazole (PRILOSEC) 40 MG capsule Take 1 capsule by mouth once daily 90 capsule 0  . rosuvastatin (CRESTOR) 20 MG tablet Take 1 tablet (20 mg total) by mouth daily. 90 tablet 1  . spironolactone (ALDACTONE) 25 MG tablet Take 1/2 (one-half) tablet by mouth once daily 45 tablet 3  . tiZANidine (ZANAFLEX) 2 MG tablet Take 1-2 tablets (2-4 mg total) by mouth every 6 (six) hours as needed for muscle spasms. 60 tablet 6  . tolterodine (DETROL LA) 4 MG 24 hr capsule TAKE 2 CAPSULES BY MOUTH ONCE DAILY 180 capsule 3  . vitamin C (ASCORBIC ACID) 250 MG tablet Take 250 mg by mouth daily.    Marland Kitchen warfarin (COUMADIN) 5 MG tablet TAKE 1 TO  1 & 1/2 (ONE & ONE-HALF) TABLETS BY MOUTH ONCE DAILY AS DIRECTED BY  COUMADIN  CLINIC 90 tablet 1  . warfarin (COUMADIN) 5 MG tablet TAKE 1 & 1/2 (ONE & ONE-HALF) TABLETS BY MOUTH ONCE DAILY AS DIRECTED BY  COUMADIN  CLINIC 90 tablet 1   No current facility-administered medications on file prior to visit.    Allergies  Allergen Reactions  . Antivert [Meclizine Hcl] Other (See Comments)    "makes me feel like my skin is falling off".  03/05/20 pt states she was placed on this a few months back and did not have a reaction    Family History  Problem Relation Age of Onset  . Arthritis Mother   . Hyperlipidemia Mother   . Diabetes Mother   . Hypertension Mother   . Arthritis Father   . Hyperlipidemia Father   . Heart  disease Father   . Diabetes Father   . Stroke Brother   . Hypertension Brother   . Diabetes Brother   . Arthritis Maternal Grandmother   . Hyperlipidemia Maternal Grandmother   . Diabetes Maternal Grandmother   . Hypertension Maternal Grandmother   . Arthritis Maternal Grandfather   . Hyperlipidemia Maternal Grandfather   . Hypertension Maternal Grandfather   .  Arthritis Paternal Grandmother   . Hyperlipidemia Paternal Grandmother   . Diabetes Paternal Grandmother   . Hypertension Paternal Grandmother   . Arthritis Paternal Grandfather   . Hyperlipidemia Paternal Grandfather   . Diabetes Paternal Grandfather   . Hypertension Paternal Grandfather     BP 140/60 (BP Location: Right Arm, Patient Position: Sitting, Cuff Size: Large)   Pulse 64   Ht _0  (1.6 m)   Wt 240 lb 6.4 oz (109 kg)   SpO2 96%   BMI 42.58 kg/m   Review of Systems She denies hypoglycemia    Objective:   Physical Exam VITAL SIGNS:  See vs page GENERAL: no distress Pulses: dorsalis pedis intact bilat.   MSK: no deformity of the feet CV: trace bilat leg edema Skin:  no ulcer on the feet.  normal color and temp on the feet. Neuro: sensation is intact to touch on the feet.    Lab Results  Component Value Date   CREATININE 0.53 (L) 01/15/2020   BUN 8 01/15/2020   NA 141 01/15/2020   K 4.7 01/15/2020   CL 102 01/15/2020   CO2 26 01/15/2020    Lab Results  Component Value Date   HGBA1C 8.0 (A) 02/07/2021       Assessment & Plan:  Type 2 DM: uncontrolled.   Patient Instructions  check your blood sugar once a day.  vary the time of day when you check, between before the 3 meals, and at bedtime.  also check if you have symptoms of your blood sugar being too high or too low.  please keep a record of the readings and bring it to your next appointment here (or you can bring the meter itself).  You can write it on any piece of paper.  please call us sooner if your blood sugar goes below 70, or if most  of your readings are over 200.   Please continue the same metformin, and: I have sent a prescription to your pharmacy, to increase the repaglinide.  Please come back for a follow-up appointment in 2 months.

## 2021-02-07 NOTE — Patient Instructions (Addendum)
check your blood sugar once a day.  vary the time of day when you check, between before the 3 meals, and at bedtime.  also check if you have symptoms of your blood sugar being too high or too low.  please keep a record of the readings and bring it to your next appointment here (or you can bring the meter itself).  You can write it on any piece of paper.  please call us sooner if your blood sugar goes below 70, or if most of your readings are over 200.   Please continue the same metformin, and: I have sent a prescription to your pharmacy, to increase the repaglinide.  Please come back for a follow-up appointment in 2 months.

## 2021-02-11 ENCOUNTER — Encounter: Payer: Self-pay | Admitting: Family Medicine

## 2021-02-11 ENCOUNTER — Other Ambulatory Visit: Payer: Self-pay | Admitting: Family Medicine

## 2021-02-11 MED ORDER — CLONAZEPAM 0.5 MG PO TABS: ORAL_TABLET | ORAL | 1 refills | Status: DC

## 2021-02-14 ENCOUNTER — Telehealth: Payer: Self-pay

## 2021-02-14 NOTE — Telephone Encounter (Signed)
lmom for Warfarin overdue self tester 

## 2021-02-15 ENCOUNTER — Encounter: Payer: Self-pay | Admitting: Family Medicine

## 2021-02-15 ENCOUNTER — Other Ambulatory Visit (INDEPENDENT_AMBULATORY_CARE_PROVIDER_SITE_OTHER): Payer: Self-pay | Admitting: Family Medicine

## 2021-02-15 MED ORDER — METFORMIN HCL 1000 MG PO TABS: 1000.0000 mg | ORAL_TABLET | Freq: Two times a day (BID) | ORAL | 0 refills | Status: DC

## 2021-02-16 ENCOUNTER — Telehealth: Payer: Self-pay

## 2021-02-16 NOTE — Telephone Encounter (Signed)
lmom for Warfarin overdue self tester 

## 2021-02-21 ENCOUNTER — Ambulatory Visit (INDEPENDENT_AMBULATORY_CARE_PROVIDER_SITE_OTHER): Payer: Medicare Other | Admitting: Pharmacist Clinician (PhC)/ Clinical Pharmacy Specialist

## 2021-02-21 DIAGNOSIS — I480 Paroxysmal atrial fibrillation: Secondary | ICD-10-CM | POA: Diagnosis not present

## 2021-02-21 DIAGNOSIS — Z7901 Long term (current) use of anticoagulants: Secondary | ICD-10-CM | POA: Diagnosis not present

## 2021-02-21 LAB — POCT INR: INR: 2.9 (ref 2.0–3.0)

## 2021-02-28 IMAGING — DX PORTABLE RIGHT KNEE - 1-2 VIEW
2 series · 2 of 2 positions shown · non-contrast
Comparison: 08/19/2018

CLINICAL DATA: Status post right knee replacement

EXAM:
PORTABLE RIGHT KNEE - 1-2 VIEW

[knee ap]
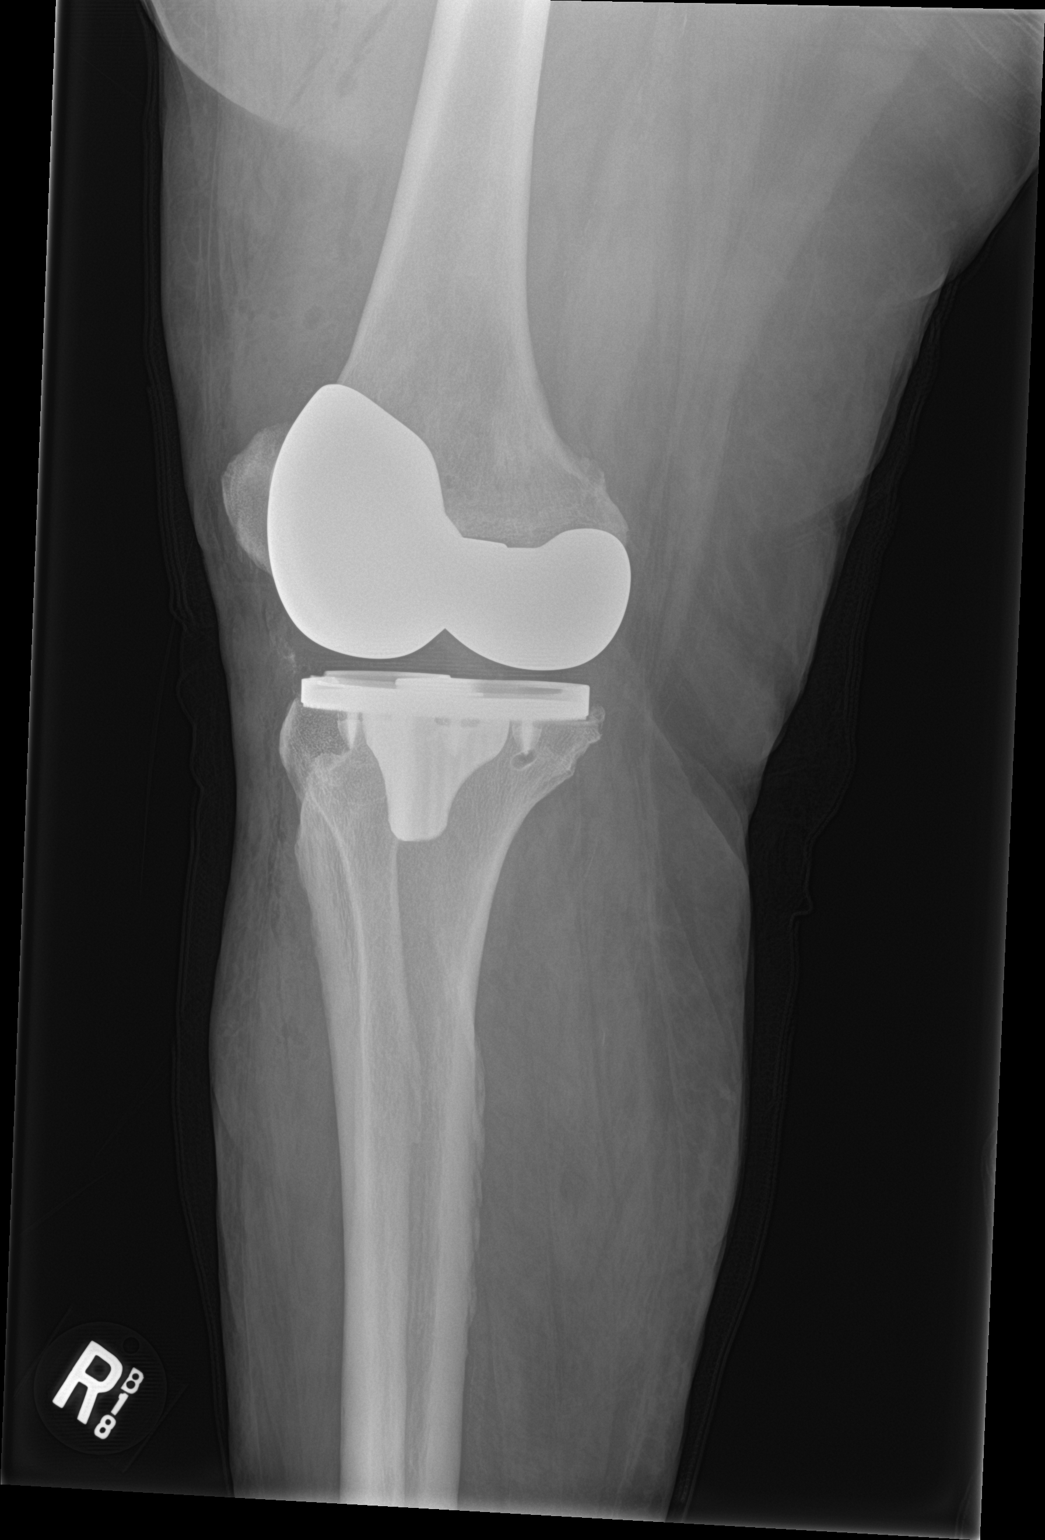

[knee lat]
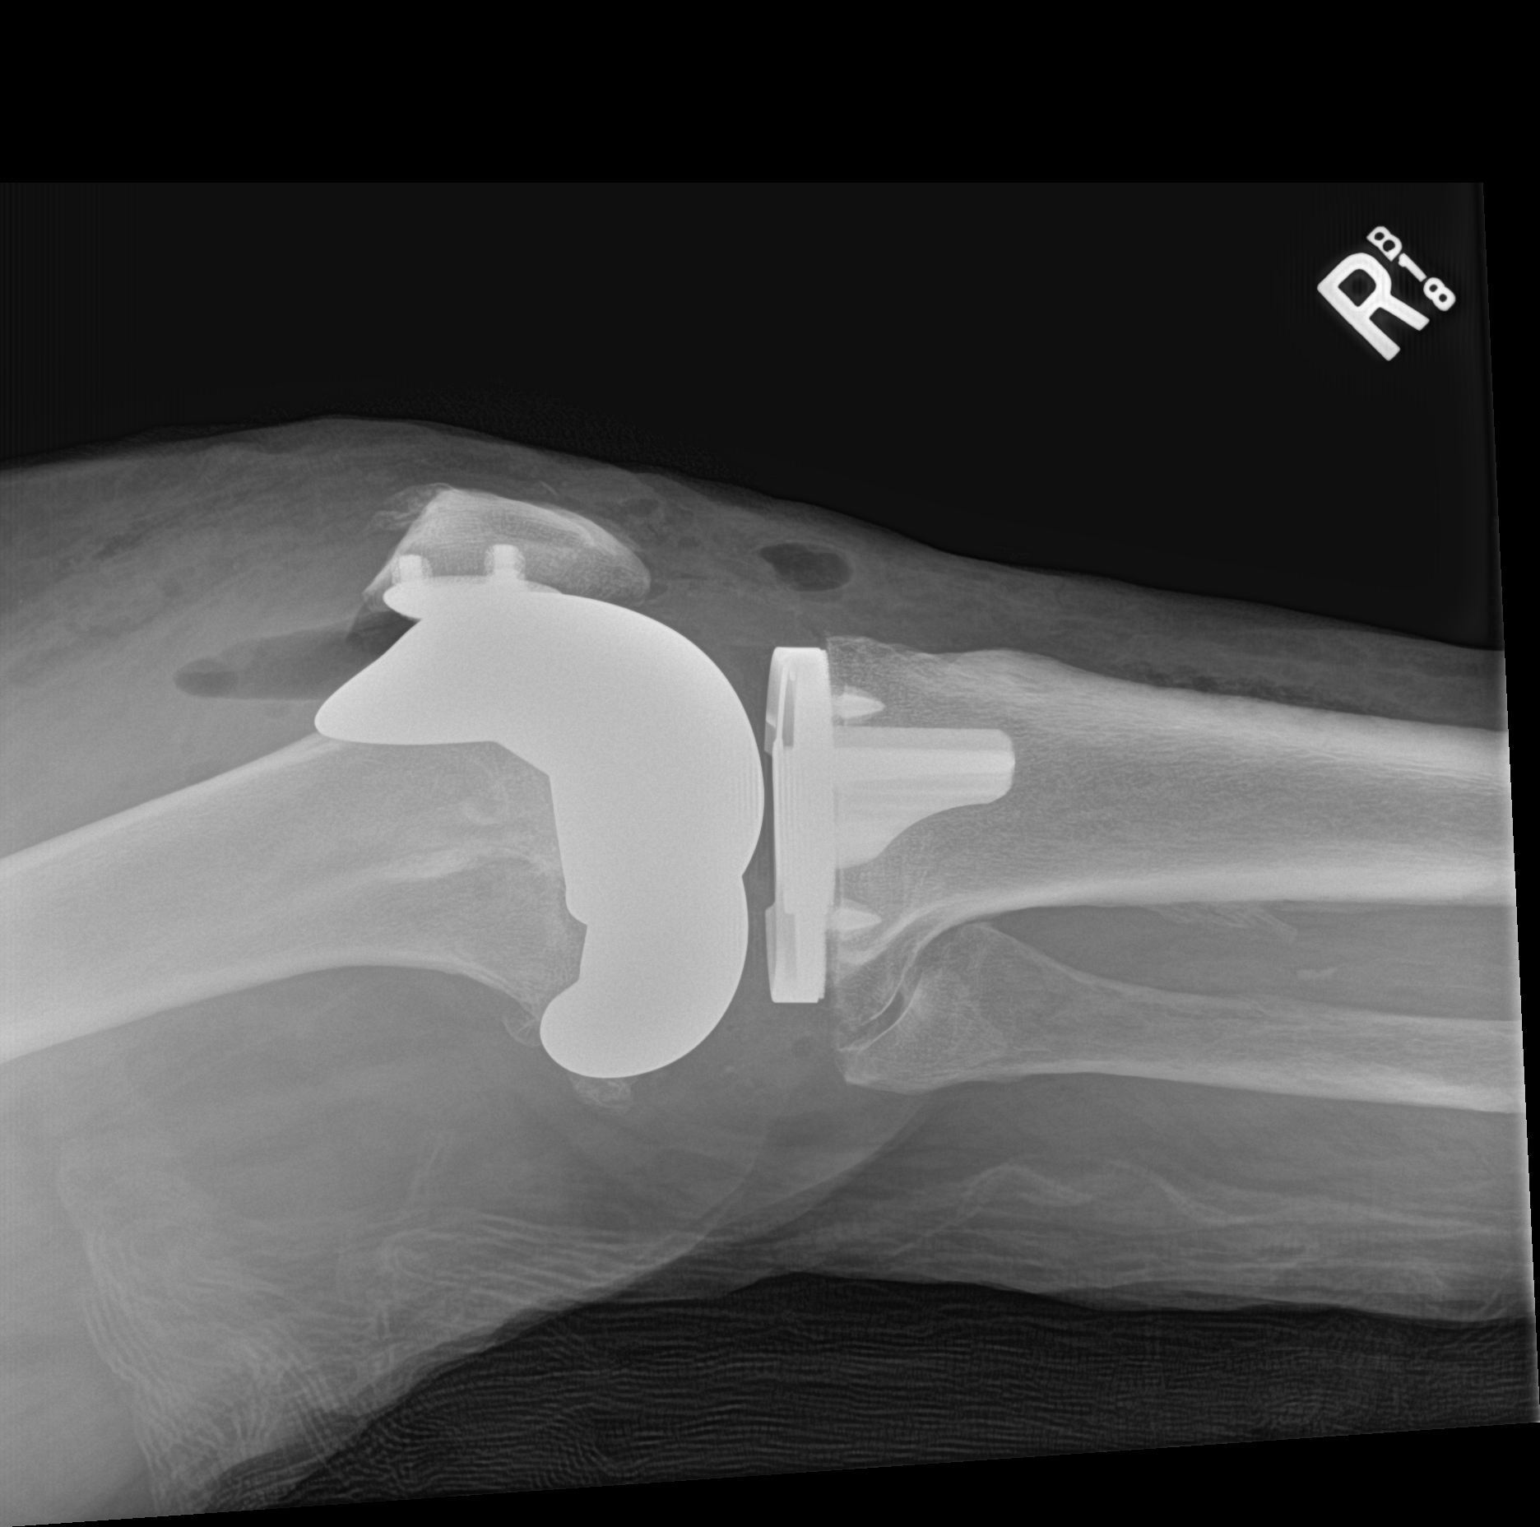

[2 of 2 positions shown; findings below may reference images not displayed]

FINDINGS: Interval right knee replacement with normal alignment and intact
hardware. No fracture. Moderate gas in the soft tissues consistent
with recent surgery.
IMPRESSION: Right knee replacement with expected postsurgical changes

## 2021-03-04 DIAGNOSIS — K219 Gastro-esophageal reflux disease without esophagitis: Secondary | ICD-10-CM | POA: Diagnosis not present

## 2021-03-04 DIAGNOSIS — J3489 Other specified disorders of nose and nasal sinuses: Secondary | ICD-10-CM | POA: Diagnosis not present

## 2021-03-04 DIAGNOSIS — Z7901 Long term (current) use of anticoagulants: Secondary | ICD-10-CM | POA: Diagnosis not present

## 2021-03-07 ENCOUNTER — Ambulatory Visit (INDEPENDENT_AMBULATORY_CARE_PROVIDER_SITE_OTHER): Payer: Medicare Other | Admitting: Pharmacist

## 2021-03-07 DIAGNOSIS — Z7901 Long term (current) use of anticoagulants: Secondary | ICD-10-CM

## 2021-03-07 LAB — POCT INR: INR: 1.9 — AB (ref 2.0–3.0)

## 2021-03-16 ENCOUNTER — Encounter: Payer: Self-pay | Admitting: Family Medicine

## 2021-03-23 ENCOUNTER — Ambulatory Visit (INDEPENDENT_AMBULATORY_CARE_PROVIDER_SITE_OTHER): Payer: Medicare Other | Admitting: Cardiovascular Disease

## 2021-03-23 DIAGNOSIS — I480 Paroxysmal atrial fibrillation: Secondary | ICD-10-CM | POA: Diagnosis not present

## 2021-03-23 DIAGNOSIS — Z7901 Long term (current) use of anticoagulants: Secondary | ICD-10-CM | POA: Diagnosis not present

## 2021-03-23 LAB — POCT INR: INR: 2.3 (ref 2.0–3.0)

## 2021-03-30 DIAGNOSIS — M5416 Radiculopathy, lumbar region: Secondary | ICD-10-CM | POA: Diagnosis not present

## 2021-04-07 ENCOUNTER — Telehealth: Payer: Self-pay

## 2021-04-07 NOTE — Telephone Encounter (Signed)
Lmom Warfarin overdue self tester

## 2021-04-08 LAB — POCT INR: INR: 2.8 (ref 2.0–3.0)

## 2021-04-11 ENCOUNTER — Ambulatory Visit (INDEPENDENT_AMBULATORY_CARE_PROVIDER_SITE_OTHER): Payer: Medicare Other | Admitting: Cardiology

## 2021-04-11 DIAGNOSIS — I480 Paroxysmal atrial fibrillation: Secondary | ICD-10-CM | POA: Diagnosis not present

## 2021-04-11 DIAGNOSIS — Z7901 Long term (current) use of anticoagulants: Secondary | ICD-10-CM

## 2021-04-12 ENCOUNTER — Other Ambulatory Visit (INDEPENDENT_AMBULATORY_CARE_PROVIDER_SITE_OTHER): Payer: Self-pay | Admitting: Family Medicine

## 2021-04-12 ENCOUNTER — Encounter: Payer: Self-pay | Admitting: Family Medicine

## 2021-04-12 ENCOUNTER — Ambulatory Visit: Payer: Medicare Other | Admitting: Endocrinology

## 2021-04-15 LAB — POCT INR: INR: 2.8 (ref 2.0–3.0)

## 2021-04-18 ENCOUNTER — Ambulatory Visit (INDEPENDENT_AMBULATORY_CARE_PROVIDER_SITE_OTHER): Payer: Medicare Other | Admitting: Cardiovascular Disease

## 2021-04-18 DIAGNOSIS — Z7901 Long term (current) use of anticoagulants: Secondary | ICD-10-CM | POA: Diagnosis not present

## 2021-04-20 ENCOUNTER — Other Ambulatory Visit: Payer: Self-pay | Admitting: Cardiovascular Disease

## 2021-04-20 DIAGNOSIS — D6869 Other thrombophilia: Secondary | ICD-10-CM | POA: Diagnosis not present

## 2021-04-20 DIAGNOSIS — E1169 Type 2 diabetes mellitus with other specified complication: Secondary | ICD-10-CM | POA: Diagnosis not present

## 2021-04-20 DIAGNOSIS — F419 Anxiety disorder, unspecified: Secondary | ICD-10-CM | POA: Diagnosis not present

## 2021-04-20 DIAGNOSIS — K219 Gastro-esophageal reflux disease without esophagitis: Secondary | ICD-10-CM | POA: Diagnosis not present

## 2021-04-20 DIAGNOSIS — I480 Paroxysmal atrial fibrillation: Secondary | ICD-10-CM | POA: Diagnosis not present

## 2021-04-20 DIAGNOSIS — I10 Essential (primary) hypertension: Secondary | ICD-10-CM | POA: Diagnosis not present

## 2021-04-20 DIAGNOSIS — E7800 Pure hypercholesterolemia, unspecified: Secondary | ICD-10-CM | POA: Diagnosis not present

## 2021-04-20 DIAGNOSIS — F410 Panic disorder [episodic paroxysmal anxiety] without agoraphobia: Secondary | ICD-10-CM | POA: Diagnosis not present

## 2021-04-20 DIAGNOSIS — D692 Other nonthrombocytopenic purpura: Secondary | ICD-10-CM | POA: Diagnosis not present

## 2021-04-20 DIAGNOSIS — F5101 Primary insomnia: Secondary | ICD-10-CM | POA: Diagnosis not present

## 2021-04-20 DIAGNOSIS — N3281 Overactive bladder: Secondary | ICD-10-CM | POA: Diagnosis not present

## 2021-04-20 DIAGNOSIS — Z79899 Other long term (current) drug therapy: Secondary | ICD-10-CM | POA: Diagnosis not present

## 2021-04-21 ENCOUNTER — Other Ambulatory Visit: Payer: Self-pay | Admitting: Cardiovascular Disease

## 2021-04-21 NOTE — Telephone Encounter (Signed)
Prescription refill request received for warfarin [place]: 12/26/19 Next INR check: 04/25/21 Warfarin tablet strength: 5mg 

## 2021-04-25 ENCOUNTER — Ambulatory Visit (INDEPENDENT_AMBULATORY_CARE_PROVIDER_SITE_OTHER): Payer: Medicare Other | Admitting: Cardiovascular Disease

## 2021-04-25 DIAGNOSIS — Z7901 Long term (current) use of anticoagulants: Secondary | ICD-10-CM

## 2021-04-25 LAB — POCT INR: INR: 4.9 — AB (ref 2.0–3.0)

## 2021-04-26 DIAGNOSIS — M5136 Other intervertebral disc degeneration, lumbar region: Secondary | ICD-10-CM | POA: Diagnosis not present

## 2021-04-26 DIAGNOSIS — M5416 Radiculopathy, lumbar region: Secondary | ICD-10-CM | POA: Diagnosis not present

## 2021-05-03 ENCOUNTER — Ambulatory Visit (INDEPENDENT_AMBULATORY_CARE_PROVIDER_SITE_OTHER): Payer: Medicare Other | Admitting: Cardiovascular Disease

## 2021-05-03 DIAGNOSIS — I480 Paroxysmal atrial fibrillation: Secondary | ICD-10-CM | POA: Diagnosis not present

## 2021-05-03 DIAGNOSIS — Z7901 Long term (current) use of anticoagulants: Secondary | ICD-10-CM | POA: Diagnosis not present

## 2021-05-03 LAB — POCT INR: INR: 4.1 — AB (ref 2.0–3.0)

## 2021-05-03 NOTE — Patient Instructions (Signed)
Description   Hold dose today and tomorrow and then reduce dose to '5mg'$  daily.  Repeat INR in 1 week

## 2021-05-10 LAB — POCT INR: INR: 2.6 (ref 2.0–3.0)

## 2021-05-11 ENCOUNTER — Ambulatory Visit (INDEPENDENT_AMBULATORY_CARE_PROVIDER_SITE_OTHER): Payer: Medicare Other | Admitting: Cardiology

## 2021-05-11 DIAGNOSIS — Z7901 Long term (current) use of anticoagulants: Secondary | ICD-10-CM | POA: Diagnosis not present

## 2021-05-11 DIAGNOSIS — I480 Paroxysmal atrial fibrillation: Secondary | ICD-10-CM | POA: Diagnosis not present

## 2021-05-15 ENCOUNTER — Other Ambulatory Visit: Payer: Self-pay | Admitting: Cardiovascular Disease

## 2021-05-16 ENCOUNTER — Other Ambulatory Visit: Payer: Self-pay

## 2021-05-16 ENCOUNTER — Ambulatory Visit (INDEPENDENT_AMBULATORY_CARE_PROVIDER_SITE_OTHER): Payer: Medicare Other | Admitting: Endocrinology

## 2021-05-16 VITALS — BP 134/70 | HR 73 | Ht 63.0 in | Wt 246.6 lb

## 2021-05-16 DIAGNOSIS — E119 Type 2 diabetes mellitus without complications: Secondary | ICD-10-CM | POA: Diagnosis not present

## 2021-05-16 LAB — POCT GLYCOSYLATED HEMOGLOBIN (HGB A1C): Hemoglobin A1C: 7.1 % — AB (ref 4.0–5.6)

## 2021-05-16 NOTE — Patient Instructions (Addendum)
check your blood sugar once a day.  vary the time of day when you check, between before the 3 meals, and at bedtime.  also check if you have symptoms of your blood sugar being too high or too low.  please keep a record of the readings and bring it to your next appointment here (or you can bring the meter itself).  You can write it on any piece of paper.  please call us sooner if your blood sugar goes below 70, or if most of your readings are over 200.   Please continue the same 2 diabetes medications.   If you feel the blood sugar is going low, you should check it.  Please call if it goes below 70.   Please come back for a follow-up appointment in 3 months.

## 2021-05-16 NOTE — Progress Notes (Signed)
Subjective:    Patient ID: Debra Wright, female    DOB: December 09, 1950, 70 y.o.   MRN: [id number]  HPI Pt returns for f/u of diabetes mellitus:  DM type: 2 Dx'ed: 6546 Complications: none Therapy: 2 oral meds GDM: never DKA: never Severe hypoglycemia: never Pancreatitis: never Pancreatic imaging: normal on 2003 CT SDOH: she cannot afford brand name meds; edema limits rx options.     Other: she has never been on insulin.   Interval history: no cbg record, but states cbg varies from 77-175.  She has mild hypoglycemia sxs in the afternoon (lightheadedness), almost qd, but she has not checked cbg then.   Past Medical History:  Diagnosis Date   Anxiety    Arthritis    "knees; right thumb" (10/14/2014)   Basal cell carcinoma    "burned off upper lip & right shoulder"   Chronic anticoagulation    CHADS VASC=3   Complication of anesthesia    after spinal anesthesia for left knee replacement, bladder did "not wake up". Was incontinent for 4 days post surgery   Depression    Fibroid tumor    Frequent diarrhea    GERD (gastroesophageal reflux disease)    High cholesterol    Hypertension    EF 65-70% grade 2DD   Insomnia    Migraine    "frequently when I was younger; maybe monthly now" (10/14/2014) - none after hysterectomy   PAF (paroxysmal atrial fibrillation) ([place]) 05/02/2018   converted with rate control   Pernicious anemia    pt not aware of this   Sleep apnea    uses a cpap   Type II diabetes mellitus ([place])    Vitamin B 12 deficiency     Past Surgical History:  Procedure Laterality Date   ABDOMINAL HYSTERECTOMY  1988   ACHILLES TENDON SURGERY Right    APPENDECTOMY  1988   BILATERAL OOPHORECTOMY  2004   CARPAL TUNNEL RELEASE Bilateral 1986   CATARACT EXTRACTION W/ INTRAOCULAR LENS  IMPLANT, BILATERAL Bilateral 2011   COLONOSCOPY     JOINT REPLACEMENT     SHOULDER ARTHROSCOPY DISTAL CLAVICLE EXCISION AND OPEN ROTATOR CUFF REPAIR Right 11/2013   TOENAIL EXCISION Right  04/2018   "big toe"   TONSILLECTOMY  1950's   TOTAL KNEE ARTHROPLASTY Left 10/13/2014   Procedure: LEFT TOTAL KNEE ARTHROPLASTY;  Surgeon: Mcarthur Rossetti, MD;  Location: [place];  Service: Orthopedics;  Laterality: Left;   TOTAL KNEE ARTHROPLASTY Right 12/10/2018   Procedure: RIGHT TOTAL KNEE ARTHROPLASTY;  Surgeon: Mcarthur Rossetti, MD;  Location: [facility];  Service: Orthopedics;  Laterality: Right;    Social History   Socioeconomic History   Marital status: Married    Spouse name: Not on file   Number of children: Not on file   Years of education: Not on file   Highest education level: Not on file  Occupational History   Not on file  Tobacco Use   Smoking status: Never   Smokeless tobacco: Never  Vaping Use   Vaping Use: Never used  Substance and Sexual Activity   Alcohol use: Yes    Comment: 10/14/2014 "might have a drink when I'm out once/month"   Drug use: No   Sexual activity: Not Currently  Other Topics Concern   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  Dx E11.9. 1 kit 11   calcium carbonate (TUMS - DOSED IN MG ELEMENTAL CALCIUM) 500 MG chewable tablet Chew 2 tablets by mouth daily as needed for indigestion or heartburn.     Calcium Carbonate-Vitamin D (CALCIUM 600 + D PO) Take 1 tablet by mouth daily.     CINNAMON PO Take 1 tablet by mouth daily.     citalopram (CELEXA) 40 MG tablet Take 1 tablet (40 mg total) by mouth daily. 90 tablet 2   clonazePAM (KLONOPIN) 0.5 MG  tablet TAKE 1/2 TO 1 (ONE-HALF TO ONE) TABLET BY MOUTH AT BEDTIME AS NEEDED 30 tablet 0   clonazePAM (KLONOPIN) 0.5 MG tablet TAKE 1/2 TO 1 (ONE-HALF TO ONE) TABLET BY MOUTH AT BEDTIME AS NEEDED 30 tablet 1   diltiazem (CARTIA XT) 180 MG 24 hr capsule Take 1 capsule (180 mg total) by mouth daily. 90 capsule 1   Eszopiclone 3 MG TABS Take 1 tablet (3 mg total) by mouth at bedtime as needed. Take immediately before bedtime 30 tablet 3   Eszopiclone 3 MG TABS TAKE ONE TABLET BY MOUTH EVERY NIGHT IMMEDIATELY BEFORE BEDTIME AS NEEDED 30 tablet 3   fluconazole (DIFLUCAN) 150 MG tablet Take 1 tablet (150 mg total) by mouth every three (3) days as needed. 5 tablet 0   glucose blood (ACCU-CHEK GUIDE) test strip CHECK BLOOD GLUCOSE IN THE MORNING AND AT BEDTIME.  E11.9 code. 100 strip 12   magic mouthwash (lidocaine, diphenhydrAMINE, alum & mag hydroxide) suspension Swish and spit 5 mLs 4 (four) times daily as needed for mouth pain. 360 mL 3   magnesium oxide (MAG-OX) 400 (241.3 Mg) MG tablet Take 400 mg by mouth daily.     metFORMIN (GLUCOPHAGE) 1000 MG tablet Take 1 tablet (1,000 mg total) by mouth 2 (two) times daily with a meal. 180 tablet 0   metoprolol tartrate (LOPRESSOR) 25 MG tablet TAKE 1/2 TABLET BY MOUTH TWICE A DAY 90 tablet 1   Omega-3 Fatty Acids (OMEGA-3 FISH OIL) 1200 MG CAPS Take 2,400 mg by mouth daily.      omeprazole (PRILOSEC) 40 MG capsule Take 1 capsule by mouth once daily 90 capsule 0   repaglinide (PRANDIN) 2 MG tablet Take 1 tablet (2 mg total) by mouth 2 (two) times daily before a meal. 180 tablet 3   spironolactone (ALDACTONE) 25 MG tablet Take 1/2 (one-half) tablet by mouth once daily 45 tablet 3   tiZANidine (ZANAFLEX) 2 MG tablet Take 1-2 tablets (2-4 mg total) by mouth every 6 (six) hours as needed for muscle spasms. 60 tablet 6   tolterodine (DETROL LA) 4 MG 24 hr capsule TAKE 2 CAPSULES BY MOUTH ONCE DAILY 180 capsule 3   vitamin C (ASCORBIC ACID) 250 MG tablet Take 250 mg  by mouth daily.     warfarin (COUMADIN) 5 MG tablet TAKE 1 TO  1 & 1/2 (ONE & ONE-HALF) TABLETS BY MOUTH ONCE DAILY AS DIRECTED BY  COUMADIN  CLINIC 90 tablet 1   warfarin (COUMADIN) 5 MG tablet TAKE 1 AND 1/2 TABLET BY  MOUTH DAILY AS DIRECTED BY COUMADIN CLINIC 40 tablet 0   warfarin (COUMADIN) 5 MG tablet TAKE 1.5 TABLETS BY MOUTH DAILY AS DIRECTED BY COUMADIN CLINIC 40 tablet 0   rosuvastatin (CRESTOR) 20 MG tablet Take 1 tablet (20 mg total) by mouth daily. 90 tablet 1   No current facility-administered medications on file prior to visit.    Allergies  Allergen Reactions   Antivert [Meclizine Hcl] Other (See Comments)    "makes me feel like my skin is falling off".  03/05/20 pt states she was placed on this a few months back and did not have a reaction    Family History  Problem Relation Age of Onset   Arthritis Mother    Hyperlipidemia Mother    Diabetes Mother    Hypertension Mother    Arthritis Father    Hyperlipidemia Father    Heart disease Father    Diabetes Father    Stroke Brother    Hypertension Brother    Diabetes Brother    Arthritis Maternal Grandmother    Hyperlipidemia Maternal Grandmother    Diabetes Maternal Grandmother    Hypertension Maternal Grandmother    Arthritis Maternal Grandfather    Hyperlipidemia Maternal Grandfather    Hypertension Maternal Grandfather    Arthritis Paternal Grandmother    Hyperlipidemia Paternal Grandmother    Diabetes Paternal Grandmother    Hypertension Paternal Grandmother    Arthritis Paternal Grandfather    Hyperlipidemia Paternal Grandfather    Diabetes Paternal Grandfather    Hypertension Paternal Grandfather     BP 134/70 (BP Location: Right Arm, Patient Position: Sitting, Cuff Size: Large)   Pulse 73   Ht '5\' 3"'  (1.6 m)   Wt 246 lb 9.6 oz (111.9 kg)   SpO2 96%   BMI 43.68 kg/m    Review of Systems Main symptom is weight gain.      Objective:   Physical Exam Pulses: dorsalis pedis intact bilat.   MSK:  no deformity of the feet CV: trace bilat leg edema Skin:  no ulcer on the feet.  normal color and temp on the feet. Neuro: sensation is intact to touch on the feet.    Lab Results  Component Value Date   HGBA1C 7.1 (A) 05/16/2021       Assessment & Plan:  Type 2 DM Lightheadedness, uncertain etiology and prognosis.  As this happens 8 hrs after taking repaglinide, uncertain if this is hypoglycemia.    Patient Instructions  check your blood sugar once a day.  vary the time of day when you check, between before the 3 meals, and at bedtime.  also check if you have symptoms of your blood sugar being too high or too low.  please keep a record of the readings and bring it to your next appointment here (or you can bring the meter itself).  You can write it on any piece of paper.  please call us sooner if your blood sugar goes below 70, or if most of your readings are over 200.   Please continue the same 2 diabetes medications.   If you feel the blood sugar is going low, you should check it.  Please call if it goes below 70.   Please come back for a follow-up appointment in 3 months.

## 2021-05-18 ENCOUNTER — Telehealth: Payer: Self-pay

## 2021-05-18 ENCOUNTER — Ambulatory Visit (INDEPENDENT_AMBULATORY_CARE_PROVIDER_SITE_OTHER): Payer: Medicare Other | Admitting: Cardiology

## 2021-05-18 DIAGNOSIS — Z7901 Long term (current) use of anticoagulants: Secondary | ICD-10-CM | POA: Diagnosis not present

## 2021-05-18 LAB — POCT INR: INR: 2.8 (ref 2.0–3.0)

## 2021-05-18 NOTE — Telephone Encounter (Signed)
Lmom for overdue inr 

## 2021-05-25 ENCOUNTER — Ambulatory Visit (INDEPENDENT_AMBULATORY_CARE_PROVIDER_SITE_OTHER): Payer: Medicare Other | Admitting: Cardiovascular Disease

## 2021-05-25 DIAGNOSIS — I480 Paroxysmal atrial fibrillation: Secondary | ICD-10-CM

## 2021-05-25 DIAGNOSIS — Z7901 Long term (current) use of anticoagulants: Secondary | ICD-10-CM

## 2021-05-25 NOTE — Patient Instructions (Signed)
Description   Continue taking '5mg'$  daily.  Repeat INR in 2 weeks

## 2021-05-27 DIAGNOSIS — I480 Paroxysmal atrial fibrillation: Secondary | ICD-10-CM | POA: Diagnosis not present

## 2021-05-27 LAB — POCT INR: INR: 2.6 (ref 2.0–3.0)

## 2021-06-08 ENCOUNTER — Telehealth: Payer: Self-pay

## 2021-06-08 LAB — POCT INR: INR: 3.1 — AB (ref 2.0–3.0)

## 2021-06-08 NOTE — Telephone Encounter (Signed)
LMOM FOR OVERDUE INR 

## 2021-06-09 ENCOUNTER — Ambulatory Visit (INDEPENDENT_AMBULATORY_CARE_PROVIDER_SITE_OTHER): Payer: Medicare Other | Admitting: Internal Medicine

## 2021-06-09 DIAGNOSIS — Z7901 Long term (current) use of anticoagulants: Secondary | ICD-10-CM | POA: Diagnosis not present

## 2021-06-09 DIAGNOSIS — Z5181 Encounter for therapeutic drug level monitoring: Secondary | ICD-10-CM

## 2021-06-09 NOTE — Patient Instructions (Signed)
-   Continue taking '5mg'$  daily.   - Repeat INR in 2 weeks

## 2021-06-10 DIAGNOSIS — R300 Dysuria: Secondary | ICD-10-CM | POA: Diagnosis not present

## 2021-06-10 DIAGNOSIS — F419 Anxiety disorder, unspecified: Secondary | ICD-10-CM | POA: Diagnosis not present

## 2021-06-16 ENCOUNTER — Other Ambulatory Visit: Payer: Self-pay | Admitting: Cardiovascular Disease

## 2021-06-16 DIAGNOSIS — I480 Paroxysmal atrial fibrillation: Secondary | ICD-10-CM

## 2021-06-16 DIAGNOSIS — L600 Ingrowing nail: Secondary | ICD-10-CM | POA: Diagnosis not present

## 2021-06-17 ENCOUNTER — Encounter: Payer: Self-pay | Admitting: Physician Assistant

## 2021-06-17 ENCOUNTER — Encounter: Payer: Medicare Other | Admitting: Physician Assistant

## 2021-06-17 ENCOUNTER — Other Ambulatory Visit: Payer: Self-pay

## 2021-06-17 NOTE — Progress Notes (Signed)
This encounter was created in error - please disregard.

## 2021-06-20 ENCOUNTER — Telehealth: Payer: Self-pay

## 2021-06-20 NOTE — Telephone Encounter (Signed)
Pt called in to report that they were placed on keflex for a uti and would like to know if that interacts w/coumadin. Routing to pharmd pool for advisement

## 2021-06-20 NOTE — Telephone Encounter (Signed)
Returned call to pt and stated that they were fine on keflex w/coumadin

## 2021-06-20 NOTE — Telephone Encounter (Signed)
No interaction between Keflex and Coumadin, pt can continue on antibiotic, doesn't need INR check moved up.

## 2021-06-22 ENCOUNTER — Ambulatory Visit (INDEPENDENT_AMBULATORY_CARE_PROVIDER_SITE_OTHER): Payer: Medicare Other | Admitting: Pharmacist Clinician (PhC)/ Clinical Pharmacy Specialist

## 2021-06-22 ENCOUNTER — Telehealth: Payer: Self-pay

## 2021-06-22 DIAGNOSIS — I480 Paroxysmal atrial fibrillation: Secondary | ICD-10-CM | POA: Diagnosis not present

## 2021-06-22 DIAGNOSIS — Z7901 Long term (current) use of anticoagulants: Secondary | ICD-10-CM | POA: Diagnosis not present

## 2021-06-22 LAB — POCT INR: INR: 5.3 — AB (ref 2.0–3.0)

## 2021-06-22 NOTE — Telephone Encounter (Signed)
Pt called in and stated that they need to be off warfarin for a back injection 06/30/21. Routing to pharmd pool to advise. Routing to pharmd pool to advise

## 2021-06-24 NOTE — Telephone Encounter (Signed)
I s/w the pt in regards to message sent to me about back injection. I explained to the pt that our office will require official clearance request to be faxed to our office. Pt states she was told her clearance from before was good for 1 year. I explained to the pt that is incorrect on our behalf. I stated with her being on Coumadin we will always need a clearance for any procedures she may have in the future. I stated that this is to protect her. It is important that if she needs to hold coumadin, that the hold time is appropriate to keep her from having a stroke or developing a blood clot. Pt is agreeable and said that is why she told the coumadin clinic 2 days ago. I informed the pt that I will contact the doctors office to get complete information I need for the cardiologist and the Pharm-D for their review, assessment and recommendations. Pt states the doctor is Dr. Davy Pique she believes.   I called Dr. Dionne Ano office and left a message for his surgery scheduler Janett Billow to please fax over ASAP as clearance request so that I may get this to the pre op provider and Pharm-D in a timely manner, especially since procedure is set for 06/30/21. I will await for fax from [facility] with Dr. Dionne Ano office.

## 2021-06-24 NOTE — Telephone Encounter (Signed)
FYI: pt also has appt with Almyra Deforest, [facility] 06/27/21. I will forward this note to [facility] for upcoming appt as well as FYI.

## 2021-06-24 NOTE — Telephone Encounter (Signed)
Can we request a clearance form?

## 2021-06-24 NOTE — Telephone Encounter (Signed)
I will forward this note to pre op pool and pharm-D as fyi message left for clearance request.

## 2021-06-27 ENCOUNTER — Other Ambulatory Visit: Payer: Self-pay

## 2021-06-27 ENCOUNTER — Ambulatory Visit (INDEPENDENT_AMBULATORY_CARE_PROVIDER_SITE_OTHER): Payer: Medicare Other | Admitting: Physician Assistant

## 2021-06-27 ENCOUNTER — Encounter: Payer: Self-pay | Admitting: Physician Assistant

## 2021-06-27 VITALS — BP 128/82 | HR 65 | Ht 62.5 in | Wt 243.4 lb

## 2021-06-27 DIAGNOSIS — Z01818 Encounter for other preprocedural examination: Secondary | ICD-10-CM | POA: Diagnosis not present

## 2021-06-27 DIAGNOSIS — E119 Type 2 diabetes mellitus without complications: Secondary | ICD-10-CM | POA: Diagnosis not present

## 2021-06-27 DIAGNOSIS — E785 Hyperlipidemia, unspecified: Secondary | ICD-10-CM

## 2021-06-27 DIAGNOSIS — I10 Essential (primary) hypertension: Secondary | ICD-10-CM | POA: Diagnosis not present

## 2021-06-27 DIAGNOSIS — I480 Paroxysmal atrial fibrillation: Secondary | ICD-10-CM

## 2021-06-27 LAB — POCT INR: INR: 2.1 (ref 2.0–3.0)

## 2021-06-27 MED ORDER — ICOSAPENT ETHYL 1 G PO CAPS: 2.0000 g | ORAL_CAPSULE | Freq: Two times a day (BID) | ORAL | 3 refills | Status: DC

## 2021-06-27 NOTE — Progress Notes (Signed)
Cardiology Office Note:    Date:  06/28/2021   ID:  Debra Wright, Debra Wright 1950-11-18, MRN [id number]  PCP:  [name], [place] Providers Cardiologist:  Shelva Majestic, MD     Referring MD: [name], PA   No chief complaint on file.   History of Present Illness:    Debra Wright is a 70 y.o. female with a hx of hypertension, DM2, obstructive sleep apnea on CPAP, hyperlipidemia, PAF and anxiety.  In August 2019, she had steroid injection to her right knee, upon returning home she had acute onset of palpitation associated with chest pain and diaphoresis.  EMS tracing recorded atrial fibrillation with RVR.  While in the emergency room, her heart rate was between 150-180s, she was given IV metoprolol and converted to sinus rhythm.  Her amlodipine was changed to diltiazem.  Echocardiogram at the time showed EF was normal, grade 2 DD, aortic valve calcification without stenosis, mild to moderate LAE.  Myoview in August 2019 was negative for ischemia.  Her metoprolol was later decreased due to bradycardia.  She was last seen by Kerin Ransom, PA-C on 12/26/2019 for routine follow-up at which time she complained of dizziness when she turns her head.  She was given meclizine at the time.  Patient presents today for annual follow-up and preop clearance.  She denies any chest pain worsening dyspnea.  She checked her Coumadin level at home.  Last INR was elevated at 5.3 on 06/22/2021.  She has been holding her Coumadin since 9/24 yet anticipation of backing injection.  Given lack of chest discomfort or worsening dyspnea, she is cleared to proceed with back injection.  The previous recommendation during the previous injection in May was to hold the Coumadin for 5 days.  I have reviewed her recent INR with Cyril Mourning our clinical pharmacist, given the elevated INR, Kristen recommended recheck Coumadin level tonight at home and send the result to the Coumadin clinic to make sure her INR is coming  back down prior to the surgery.  Otherwise, her most recent triglyceride was elevated at 315, she is already on over-the-counter fish oil.  Her insurance does not cover Lovaza, however it does cover with Cipro.  I will start her on Vascepa 2 g twice a day to replace her home OTC fish oil.  Past Medical History:  Diagnosis Date   Anxiety    Arthritis    "knees; right thumb" (10/14/2014)   Basal cell carcinoma    "burned off upper lip & right shoulder"   Chronic anticoagulation    CHADS VASC=3   Complication of anesthesia    after spinal anesthesia for left knee replacement, bladder did "not wake up". Was incontinent for 4 days post surgery   Depression    Fibroid tumor    Frequent diarrhea    GERD (gastroesophageal reflux disease)    High cholesterol    Hypertension    EF 65-70% grade 2DD   Insomnia    Migraine    "frequently when I was younger; maybe monthly now" (10/14/2014) - none after hysterectomy   PAF (paroxysmal atrial fibrillation) ([place]) 05/02/2018   converted with rate control   Pernicious anemia    pt not aware of this   Sleep apnea    uses a cpap   Type II diabetes mellitus ([place])    Vitamin B 12 deficiency     Past Surgical History:  Procedure Laterality Date   [place]  ACHILLES TENDON SURGERY Right    APPENDECTOMY  1988   BILATERAL OOPHORECTOMY  2004   CARPAL TUNNEL RELEASE Bilateral 1986   CATARACT EXTRACTION W/ INTRAOCULAR LENS  IMPLANT, BILATERAL Bilateral 2011   COLONOSCOPY     JOINT REPLACEMENT     SHOULDER ARTHROSCOPY DISTAL CLAVICLE EXCISION AND OPEN ROTATOR CUFF REPAIR Right 11/2013   TOENAIL EXCISION Right 04/2018   "big toe"   TONSILLECTOMY  1950's   TOTAL KNEE ARTHROPLASTY Left 10/13/2014   Procedure: LEFT TOTAL KNEE ARTHROPLASTY;  Surgeon: Mcarthur Rossetti, MD;  Location: [facility];  Service: Orthopedics;  Laterality: Left;   TOTAL KNEE ARTHROPLASTY Right 12/10/2018   Procedure: RIGHT TOTAL KNEE ARTHROPLASTY;  Surgeon:  Mcarthur Rossetti, MD;  Location: [facility];  Service: Orthopedics;  Laterality: Right;    Current Medications: Current Meds  Medication Sig   icosapent Ethyl (VASCEPA) 1 g capsule Take 2 capsules (2 g total) by mouth 2 (two) times daily.     Allergies:   Antivert [meclizine hcl], Meclizine, Doxepin hcl, Oxycodone hcl, Trazodone hcl, and Zolpidem   Social History   Socioeconomic History   Marital status: Married    Spouse name: Not on file   Number of children: Not on file   Years of education: Not on file   Highest education level: Not on file  Occupational History   Not on file  Tobacco Use   Smoking status: Never   Smokeless tobacco: Never  Vaping Use   Vaping Use: Never used  Substance and Sexual Activity   Alcohol use: Yes    Comment: 10/14/2014 "might have a drink when I'm out once/month"   Drug use: No   Sexual activity: Not Currently  Other Topics Concern   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, sports for 73   Married       Social Determinants of Radio broadcast assistant Strain: Not on Art therapist Insecurity: Not on file  Transportation Needs: Not on file  Physical Activity: Not on file  Stress: Not on file  Social Connections: Not on file     Family History: The patient's family history includes Arthritis in her father, maternal grandfather, maternal grandmother, mother, paternal grandfather, and paternal grandmother; Diabetes in her brother, father, maternal grandmother, mother, paternal grandfather, and paternal grandmother; Heart disease in her father; Hyperlipidemia in her father, maternal grandfather, maternal grandmother, mother, paternal grandfather, and paternal grandmother; Hypertension in her brother, maternal grandfather, maternal grandmother, mother, paternal grandfather, and paternal grandmother; Stroke in her brother.  ROS:   Please see the history of present illness.     All other systems reviewed and are  negative.  EKGs/Labs/Other Studies Reviewed:    The following studies were reviewed today:  Myoview 05/16/2018 Nuclear stress EF: 67%. The left ventricular ejection fraction is hyperdynamic (>65%). There was no ST segment deviation noted during stress. The study is normal.   1. EF 67%, normal wall motion.  2. No evidence for ischemia or infarction on perfusion images.   EKG:  EKG is ordered today.  The ekg ordered today demonstrates normal sinus rhythm, no significant ST-T wave changes.  Recent Labs: No results found for requested labs within last 8760 hours.  Recent Lipid Panel    Component Value Date/Time   CHOL 138 08/07/2018 1530   TRIG 185 (H) 08/07/2018 1530   HDL 46 (L) 08/07/2018 1530   CHOLHDL 3.0 08/07/2018 1530  VLDL 22 05/03/2018 0153   LDLCALC 66 08/07/2018 1530   LDLDIRECT 42.0 01/31/2018 0943     Risk Assessment/Calculations:    CHA2DS2-VASc Score = 4   This indicates a 4.8% annual risk of stroke. The patient's score is based upon: CHF History: 0 HTN History: 1 Diabetes History: 1 Stroke History: 0 Vascular Disease History: 0 Age Score: 1 Gender Score: 1          Physical Exam:    VS:  BP 128/82   Pulse 65   Ht 5' 2.5" (1.588 m)   Wt 243 lb 6.4 oz (110.4 kg)   SpO2 96%   BMI 43.81 kg/m     Wt Readings from Last 3 Encounters:  06/27/21 243 lb 6.4 oz (110.4 kg)  06/17/21 245 lb 12.8 oz (111.5 kg)  05/16/21 246 lb 9.6 oz (111.9 kg)     GEN:  Well nourished, well developed in no acute distress HEENT: Normal NECK: No JVD; No carotid bruits LYMPHATICS: No lymphadenopathy CARDIAC: RRR, no murmurs, rubs, gallops RESPIRATORY:  Clear to auscultation without rales, wheezing or rhonchi  ABDOMEN: Soft, non-tender, non-distended MUSCULOSKELETAL:  No edema; No deformity  SKIN: Warm and dry NEUROLOGIC:  Alert and oriented x 3 PSYCHIATRIC:  Normal affect   ASSESSMENT:    1. Preop examination   2. Essential hypertension   3. Hyperlipidemia  LDL goal <70   4. Controlled type 2 diabetes mellitus without complication, without long-term current use of insulin ([place])   5. PAF (paroxysmal atrial fibrillation) ([facility])    PLAN:    In order of problems listed above:  Preoperative clearance: Upcoming back injection.  This is a low risk procedure.  I have discussed the case with our clinical pharmacist, she is currently planning to hold Coumadin for 5 days.  However last INR was elevated at 5, our clinical pharmacist recommended the patient to go home today and does self check her INR at home to make sure her Coumadin levels coming back down.  Hypertension: Blood pressure stable on current therapy  Hyperlipidemia: Triglycerides remain elevated, her insurance will not cover Lovaza, I recommended Vascepa 2 g twice a day dosing  DM2: Managed by primary care provider  PAF: On Coumadin and diltiazem.  Maintaining sinus rhythm today        Medication Adjustments/Labs and Tests Ordered: Current medicines are reviewed at length with the patient today.  Concerns regarding medicines are outlined above.  Orders Placed This Encounter  Procedures   EKG 12-Lead   Meds ordered this encounter  Medications   icosapent Ethyl (VASCEPA) 1 g capsule    Sig: Take 2 capsules (2 g total) by mouth 2 (two) times daily.    Dispense:  360 capsule    Refill:  3    Patient Instructions  Medication Instructions:  START Vascepa 1000 mg 2 times a day  *If you need a refill on your cardiac medications before your next appointment, please call your pharmacy*  Lab Work: NONE ordered at this time of appointment   If you have labs (blood work) drawn today and your tests are completely normal, you will receive your results only by: [place] (if you have MyChart) OR A paper copy in the mail If you have any lab test that is abnormal or we need to change your treatment, we will call you to review the results.  Testing/Procedures: NONE ordered at this  time of appointment   Follow-Up: At [facility], you and your health  needs are our priority.  As part of our continuing mission to provide you with exceptional heart care, we have created designated Provider Care Teams.  These Care Teams include your primary Cardiologist (physician) and Advanced Practice Providers (APPs -  Physician Assistants and Nurse Practitioners) who all work together to provide you with the care you need, when you need it.  Your next appointment:   1 year(s)  The format for your next appointment:   In Person  Provider:   Shelva Majestic, MD  Other Instructions   Signed, Almyra Deforest, [place]  06/28/2021 4:25 PM    [place]

## 2021-06-27 NOTE — Telephone Encounter (Addendum)
No further input needed from preop APP box since pt has appt. Added preop clearance to appt notes. Callback already routed to [place] to make him aware. Per office protocol, the provider should assess clearance at time of office visit and should forward their finalized clearance decision to requesting party below. I will remove this message from the pre-op box.

## 2021-06-27 NOTE — Patient Instructions (Signed)
Medication Instructions:  START Vascepa 1000 mg 2 times a day  *If you need a refill on your cardiac medications before your next appointment, please call your pharmacy*  Lab Work: NONE ordered at this time of appointment   If you have labs (blood work) drawn today and your tests are completely normal, you will receive your results only by: [place] (if you have MyChart) OR A paper copy in the mail If you have any lab test that is abnormal or we need to change your treatment, we will call you to review the results.  Testing/Procedures: NONE ordered at this time of appointment   Follow-Up: At [facility], you and your health needs are our priority.  As part of our continuing mission to provide you with exceptional heart care, we have created designated Provider Care Teams.  These Care Teams include your primary Cardiologist (physician) and Advanced Practice Providers (APPs -  Physician Assistants and Nurse Practitioners) who all work together to provide you with the care you need, when you need it.  Your next appointment:   1 year(s)  The format for your next appointment:   In Person  Provider:   Shelva Majestic, MD  Other Instructions

## 2021-06-28 ENCOUNTER — Ambulatory Visit (INDEPENDENT_AMBULATORY_CARE_PROVIDER_SITE_OTHER): Payer: Medicare Other | Admitting: Cardiovascular Disease

## 2021-06-28 DIAGNOSIS — Z7901 Long term (current) use of anticoagulants: Secondary | ICD-10-CM

## 2021-06-28 DIAGNOSIS — Z5181 Encounter for therapeutic drug level monitoring: Secondary | ICD-10-CM

## 2021-06-28 NOTE — Patient Instructions (Addendum)
Description   Called and instructed patient continue holding for procedure on 9/29 per Cyril Mourning, PharmD and then continue taking 5 mg daily except 2.5 mg each Sunday. Repeat INR tomorrow (9/28) .

## 2021-06-29 ENCOUNTER — Ambulatory Visit (INDEPENDENT_AMBULATORY_CARE_PROVIDER_SITE_OTHER): Payer: Medicare Other | Admitting: Cardiology

## 2021-06-29 DIAGNOSIS — Z7901 Long term (current) use of anticoagulants: Secondary | ICD-10-CM

## 2021-06-29 LAB — POCT INR: INR: 1.1 — AB (ref 2.0–3.0)

## 2021-07-06 ENCOUNTER — Ambulatory Visit (INDEPENDENT_AMBULATORY_CARE_PROVIDER_SITE_OTHER): Payer: Medicare Other | Admitting: Pharmacist

## 2021-07-06 ENCOUNTER — Telehealth: Payer: Self-pay

## 2021-07-06 DIAGNOSIS — I480 Paroxysmal atrial fibrillation: Secondary | ICD-10-CM

## 2021-07-06 DIAGNOSIS — Z7901 Long term (current) use of anticoagulants: Secondary | ICD-10-CM | POA: Diagnosis not present

## 2021-07-06 DIAGNOSIS — F419 Anxiety disorder, unspecified: Secondary | ICD-10-CM | POA: Diagnosis not present

## 2021-07-06 DIAGNOSIS — F320 Major depressive disorder, single episode, mild: Secondary | ICD-10-CM | POA: Diagnosis not present

## 2021-07-06 DIAGNOSIS — R413 Other amnesia: Secondary | ICD-10-CM | POA: Diagnosis not present

## 2021-07-06 DIAGNOSIS — F439 Reaction to severe stress, unspecified: Secondary | ICD-10-CM | POA: Diagnosis not present

## 2021-07-06 DIAGNOSIS — E538 Deficiency of other specified B group vitamins: Secondary | ICD-10-CM | POA: Diagnosis not present

## 2021-07-06 DIAGNOSIS — F410 Panic disorder [episodic paroxysmal anxiety] without agoraphobia: Secondary | ICD-10-CM | POA: Diagnosis not present

## 2021-07-06 DIAGNOSIS — R300 Dysuria: Secondary | ICD-10-CM | POA: Diagnosis not present

## 2021-07-06 LAB — POCT INR: INR: 2.1 (ref 2.0–3.0)

## 2021-07-06 NOTE — Telephone Encounter (Signed)
Lmom for inr due

## 2021-07-06 NOTE — Patient Instructions (Addendum)
Description   continue taking 5 mg daily except 2.5 mg each Sunday. Repeat INR in 2 week .  Procedure rescheduled for 10/10.  Will hold Coumadin 5 days prior.

## 2021-07-07 ENCOUNTER — Telehealth: Payer: Self-pay

## 2021-07-07 NOTE — Telephone Encounter (Signed)
I called pt's Neuro Surgeon's office and left recording to inform them of pt's INR from 10/5 @ 2.1 Goal 2-3.  Told them to call, if questions.

## 2021-07-11 DIAGNOSIS — M5416 Radiculopathy, lumbar region: Secondary | ICD-10-CM | POA: Diagnosis not present

## 2021-07-12 MED ORDER — DILTIAZEM HCL ER COATED BEADS 180 MG PO CP24: 180.0000 mg | ORAL_CAPSULE | Freq: Every day | ORAL | 1 refills | Status: DC

## 2021-07-13 ENCOUNTER — Ambulatory Visit (INDEPENDENT_AMBULATORY_CARE_PROVIDER_SITE_OTHER): Payer: Medicare Other | Admitting: Cardiology

## 2021-07-13 DIAGNOSIS — Z7901 Long term (current) use of anticoagulants: Secondary | ICD-10-CM

## 2021-07-13 LAB — POCT INR: INR: 2.2 (ref 2.0–3.0)

## 2021-07-20 MED ORDER — METOPROLOL TARTRATE 25 MG PO TABS: ORAL_TABLET | ORAL | 1 refills | Status: DC

## 2021-07-21 MED ORDER — SPIRONOLACTONE 25 MG PO TABS: ORAL_TABLET | ORAL | 3 refills | Status: DC

## 2021-07-25 DIAGNOSIS — E538 Deficiency of other specified B group vitamins: Secondary | ICD-10-CM | POA: Diagnosis not present

## 2021-07-27 ENCOUNTER — Ambulatory Visit (INDEPENDENT_AMBULATORY_CARE_PROVIDER_SITE_OTHER): Payer: Medicare Other | Admitting: Cardiology

## 2021-07-27 DIAGNOSIS — I480 Paroxysmal atrial fibrillation: Secondary | ICD-10-CM | POA: Diagnosis not present

## 2021-07-27 DIAGNOSIS — Z7901 Long term (current) use of anticoagulants: Secondary | ICD-10-CM

## 2021-07-27 LAB — POCT INR: INR: 2.6 (ref 2.0–3.0)

## 2021-07-28 ENCOUNTER — Encounter: Payer: Self-pay | Admitting: Endocrinology

## 2021-07-29 MED ORDER — DILTIAZEM HCL ER COATED BEADS 180 MG PO CP24: 180.0000 mg | ORAL_CAPSULE | Freq: Every day | ORAL | 3 refills | Status: DC

## 2021-08-09 DIAGNOSIS — M5416 Radiculopathy, lumbar region: Secondary | ICD-10-CM | POA: Diagnosis not present

## 2021-08-09 DIAGNOSIS — M5136 Other intervertebral disc degeneration, lumbar region: Secondary | ICD-10-CM | POA: Diagnosis not present

## 2021-08-16 ENCOUNTER — Ambulatory Visit: Payer: Medicare Other | Admitting: Endocrinology

## 2021-08-22 ENCOUNTER — Encounter: Payer: Self-pay | Admitting: Pharmacist

## 2021-08-23 ENCOUNTER — Ambulatory Visit (INDEPENDENT_AMBULATORY_CARE_PROVIDER_SITE_OTHER): Payer: Medicare Other | Admitting: Internal Medicine

## 2021-08-23 DIAGNOSIS — Z5181 Encounter for therapeutic drug level monitoring: Secondary | ICD-10-CM

## 2021-08-23 LAB — POCT INR: INR: 2.8 (ref 2.0–3.0)

## 2021-08-23 NOTE — Patient Instructions (Addendum)
Description    Called and spoke to pt and instructed her to continue taking warfarin 5 mg daily except 2.5 mg each Sunday. Repeat INR in 2 weeks .

## 2021-09-05 DIAGNOSIS — R3915 Urgency of urination: Secondary | ICD-10-CM | POA: Diagnosis not present

## 2021-09-05 DIAGNOSIS — N393 Stress incontinence (female) (male): Secondary | ICD-10-CM | POA: Diagnosis not present

## 2021-09-05 DIAGNOSIS — N3020 Other chronic cystitis without hematuria: Secondary | ICD-10-CM | POA: Diagnosis not present

## 2021-09-05 DIAGNOSIS — R8271 Bacteriuria: Secondary | ICD-10-CM | POA: Diagnosis not present

## 2021-09-06 DIAGNOSIS — M545 Low back pain, unspecified: Secondary | ICD-10-CM | POA: Diagnosis not present

## 2021-09-06 DIAGNOSIS — F419 Anxiety disorder, unspecified: Secondary | ICD-10-CM | POA: Diagnosis not present

## 2021-09-06 DIAGNOSIS — F320 Major depressive disorder, single episode, mild: Secondary | ICD-10-CM | POA: Diagnosis not present

## 2021-09-06 DIAGNOSIS — E538 Deficiency of other specified B group vitamins: Secondary | ICD-10-CM | POA: Diagnosis not present

## 2021-09-06 DIAGNOSIS — F439 Reaction to severe stress, unspecified: Secondary | ICD-10-CM | POA: Diagnosis not present

## 2021-09-06 DIAGNOSIS — R413 Other amnesia: Secondary | ICD-10-CM | POA: Diagnosis not present

## 2021-09-06 DIAGNOSIS — F410 Panic disorder [episodic paroxysmal anxiety] without agoraphobia: Secondary | ICD-10-CM | POA: Diagnosis not present

## 2021-09-07 ENCOUNTER — Ambulatory Visit (INDEPENDENT_AMBULATORY_CARE_PROVIDER_SITE_OTHER): Payer: Medicare Other | Admitting: Cardiovascular Disease

## 2021-09-07 ENCOUNTER — Encounter: Payer: Self-pay | Admitting: Physician Assistant

## 2021-09-07 ENCOUNTER — Telehealth: Payer: Self-pay

## 2021-09-07 DIAGNOSIS — Z7901 Long term (current) use of anticoagulants: Secondary | ICD-10-CM | POA: Diagnosis not present

## 2021-09-07 LAB — POCT INR: INR: 1.8 — AB (ref 2.0–3.0)

## 2021-09-07 NOTE — Telephone Encounter (Signed)
Spoke to patient and reminded her to check INR 

## 2021-09-12 ENCOUNTER — Ambulatory Visit: Payer: Medicare Other | Admitting: Endocrinology

## 2021-09-14 ENCOUNTER — Ambulatory Visit: Payer: Medicare Other | Admitting: Physician Assistant

## 2021-09-14 ENCOUNTER — Ambulatory Visit: Payer: Medicare Other | Admitting: Pulmonary Disease

## 2021-09-20 ENCOUNTER — Encounter: Payer: Self-pay | Admitting: Pulmonary Disease

## 2021-09-20 ENCOUNTER — Other Ambulatory Visit: Payer: Self-pay

## 2021-09-20 ENCOUNTER — Ambulatory Visit (INDEPENDENT_AMBULATORY_CARE_PROVIDER_SITE_OTHER): Payer: Medicare Other | Admitting: Pulmonary Disease

## 2021-09-20 DIAGNOSIS — G4733 Obstructive sleep apnea (adult) (pediatric): Secondary | ICD-10-CM | POA: Diagnosis not present

## 2021-09-20 NOTE — Patient Instructions (Signed)
°  Flu shot  CPAP is working well

## 2021-09-20 NOTE — Progress Notes (Signed)
° °  Subjective:    Patient ID: Debra Wright, female    DOB: 06-12-1951, 70 y.o.   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 She has  sleep maintenance insomnia,multiple medications over the past few years including Ambien, Benadryl, Tylenol PM and melatonin. She has been maintained on Lunesta 3 mg since 2014   PMH - atrial fib  RT TKR    Follow-up after 18 months CPAP is working well, she denies any problems with mask or pressure.  She reports good compliance and is unable to sleep without it. She uses clonazepam for anxiety on a as needed basis.  She uses Lunesta at 3 mg nightly for insomnia Weight is mostly unchanged at 243 pounds     Significant tests/ events reviewed HST  03/2017  severe OSA with 35 events per hour Auto CPAP 10-18 >> pressure 13  Review of Systems neg for any significant sore throat, dysphagia, itching, sneezing, nasal congestion or excess/ purulent secretions, fever, chills, sweats, unintended wt loss, pleuritic or exertional cp, hempoptysis, orthopnea pnd or change in chronic leg swelling. Also denies presyncope, palpitations, heartburn, abdominal pain, nausea, vomiting, diarrhea or change in bowel or urinary habits, dysuria,hematuria, rash, arthralgias, visual complaints, headache, numbness weakness or ataxia.      Objective:   Physical Exam  Gen. Pleasant, obese, in no distress ENT - no lesions, no post nasal drip Neck: No JVD, no thyromegaly, no carotid bruits Lungs: no use of accessory muscles, no dullness to percussion, decreased without rales or rhonchi  Cardiovascular: Rhythm regular, heart sounds  normal, no murmurs or gallops, no peripheral edema Musculoskeletal: No deformities, no cyanosis or clubbing , no tremors        Assessment & Plan:   Insomnia -she continues to use Lunesta on a nightly basis.  Clonazepam is only being used for anxiety on a as needed basis  Flu shot will be given today

## 2021-09-20 NOTE — Assessment & Plan Note (Signed)
CPAP download was reviewed which shows excellent compliance on auto settings 10 to 15 cm with average pressure of 13.4 cm and maximum pressure of 14 cm with mild leak.  She is very compliant about 8 hours per night CPAP is certainly helped improve her daytime somnolence and fatigue  Weight loss encouraged, compliance with goal of at least 4-6 hrs every night is the expectation. Advised against medications with sedative side effects Cautioned against driving when sleepy - understanding that sleepiness will vary on a day to day basis

## 2021-09-21 ENCOUNTER — Ambulatory Visit (INDEPENDENT_AMBULATORY_CARE_PROVIDER_SITE_OTHER): Payer: Medicare Other | Admitting: Cardiology

## 2021-09-21 ENCOUNTER — Telehealth: Payer: Self-pay

## 2021-09-21 DIAGNOSIS — Z7901 Long term (current) use of anticoagulants: Secondary | ICD-10-CM | POA: Diagnosis not present

## 2021-09-21 DIAGNOSIS — I4891 Unspecified atrial fibrillation: Secondary | ICD-10-CM

## 2021-09-21 LAB — POCT INR: INR: 3.2 — AB (ref 2.0–3.0)

## 2021-09-21 NOTE — Telephone Encounter (Signed)
Lpm to check INR 
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
## 2021-09-28 ENCOUNTER — Ambulatory Visit: Payer: Medicare Other | Admitting: Physician Assistant

## 2021-09-29 ENCOUNTER — Emergency Department (HOSPITAL_COMMUNITY): Payer: Medicare Other

## 2021-09-29 ENCOUNTER — Telehealth: Payer: Self-pay | Admitting: *Deleted

## 2021-09-29 ENCOUNTER — Other Ambulatory Visit: Payer: Self-pay

## 2021-09-29 ENCOUNTER — Emergency Department (HOSPITAL_COMMUNITY)
Admission: EM | Admit: 2021-09-29 | Discharge: 2021-09-29 | Disposition: A | Discharge status: Home / Self Care | Payer: Medicare Other | Attending: Emergency Medicine | Admitting: Emergency Medicine

## 2021-09-29 DIAGNOSIS — W01198A Fall on same level from slipping, tripping and stumbling with subsequent striking against other object, initial encounter: Secondary | ICD-10-CM | POA: Diagnosis not present

## 2021-09-29 DIAGNOSIS — Y92002 Bathroom of unspecified non-institutional (private) residence single-family (private) house as the place of occurrence of the external cause: Secondary | ICD-10-CM | POA: Diagnosis not present

## 2021-09-29 DIAGNOSIS — N9489 Other specified conditions associated with female genital organs and menstrual cycle: Secondary | ICD-10-CM | POA: Diagnosis not present

## 2021-09-29 DIAGNOSIS — M25551 Pain in right hip: Secondary | ICD-10-CM | POA: Diagnosis not present

## 2021-09-29 DIAGNOSIS — M25552 Pain in left hip: Secondary | ICD-10-CM | POA: Insufficient documentation

## 2021-09-29 DIAGNOSIS — M160 Bilateral primary osteoarthritis of hip: Secondary | ICD-10-CM | POA: Diagnosis not present

## 2021-09-29 DIAGNOSIS — Z7984 Long term (current) use of oral hypoglycemic drugs: Secondary | ICD-10-CM | POA: Insufficient documentation

## 2021-09-29 DIAGNOSIS — Z7901 Long term (current) use of anticoagulants: Secondary | ICD-10-CM | POA: Diagnosis not present

## 2021-09-29 DIAGNOSIS — Z85828 Personal history of other malignant neoplasm of skin: Secondary | ICD-10-CM | POA: Insufficient documentation

## 2021-09-29 DIAGNOSIS — S0003XA Contusion of scalp, initial encounter: Secondary | ICD-10-CM | POA: Diagnosis not present

## 2021-09-29 DIAGNOSIS — Z96653 Presence of artificial knee joint, bilateral: Secondary | ICD-10-CM | POA: Insufficient documentation

## 2021-09-29 DIAGNOSIS — S0990XA Unspecified injury of head, initial encounter: Secondary | ICD-10-CM

## 2021-09-29 DIAGNOSIS — I10 Essential (primary) hypertension: Secondary | ICD-10-CM | POA: Diagnosis not present

## 2021-09-29 DIAGNOSIS — K118 Other diseases of salivary glands: Secondary | ICD-10-CM | POA: Insufficient documentation

## 2021-09-29 DIAGNOSIS — M4602 Spinal enthesopathy, cervical region: Secondary | ICD-10-CM | POA: Diagnosis not present

## 2021-09-29 DIAGNOSIS — E119 Type 2 diabetes mellitus without complications: Secondary | ICD-10-CM | POA: Diagnosis not present

## 2021-09-29 DIAGNOSIS — Y9301 Activity, walking, marching and hiking: Secondary | ICD-10-CM | POA: Insufficient documentation

## 2021-09-29 DIAGNOSIS — I6523 Occlusion and stenosis of bilateral carotid arteries: Secondary | ICD-10-CM | POA: Diagnosis not present

## 2021-09-29 DIAGNOSIS — Z79899 Other long term (current) drug therapy: Secondary | ICD-10-CM | POA: Diagnosis not present

## 2021-09-29 DIAGNOSIS — M2578 Osteophyte, vertebrae: Secondary | ICD-10-CM | POA: Diagnosis not present

## 2021-09-29 DIAGNOSIS — S199XXA Unspecified injury of neck, initial encounter: Secondary | ICD-10-CM | POA: Diagnosis not present

## 2021-09-29 DIAGNOSIS — I4891 Unspecified atrial fibrillation: Secondary | ICD-10-CM | POA: Diagnosis not present

## 2021-09-29 DIAGNOSIS — I700 Atherosclerosis of aorta: Secondary | ICD-10-CM | POA: Diagnosis not present

## 2021-09-29 LAB — I-STAT CHEM 8, ED
BUN: 7 mg/dL — ABNORMAL LOW (ref 8–23)
Calcium, Ion: 1.14 mmol/L — ABNORMAL LOW (ref 1.15–1.40)
Chloride: 100 mmol/L (ref 98–111)
Creatinine, Ser: 0.7 mg/dL (ref 0.44–1.00)
Glucose, Bld: 140 mg/dL — ABNORMAL HIGH (ref 70–99)
HCT: 37 % (ref 36.0–46.0)
Hemoglobin: 12.6 g/dL (ref 12.0–15.0)
Potassium: 4 mmol/L (ref 3.5–5.1)
Sodium: 137 mmol/L (ref 135–145)
TCO2: 26 mmol/L (ref 22–32)

## 2021-09-29 LAB — CBC WITH DIFFERENTIAL/PLATELET
Abs Immature Granulocytes: 0.03 10*3/uL (ref 0.00–0.07)
Basophils Absolute: 0.1 10*3/uL (ref 0.0–0.1)
Basophils Relative: 1 %
Eosinophils Absolute: 0.2 10*3/uL (ref 0.0–0.5)
Eosinophils Relative: 2 %
HCT: 41.4 % (ref 36.0–46.0)
Hemoglobin: 13.2 g/dL (ref 12.0–15.0)
Immature Granulocytes: 0 %
Lymphocytes Relative: 30 %
Lymphs Abs: 2.9 10*3/uL (ref 0.7–4.0)
MCH: 29.1 pg (ref 26.0–34.0)
MCHC: 31.9 g/dL (ref 30.0–36.0)
MCV: 91.4 fL (ref 80.0–100.0)
Monocytes Absolute: 0.9 10*3/uL (ref 0.1–1.0)
Monocytes Relative: 9 %
Neutro Abs: 5.7 10*3/uL (ref 1.7–7.7)
Neutrophils Relative %: 58 %
Platelets: 313 10*3/uL (ref 150–400)
RBC: 4.53 MIL/uL (ref 3.87–5.11)
RDW: 14.4 % (ref 11.5–15.5)
WBC: 9.8 10*3/uL (ref 4.0–10.5)
nRBC: 0 % (ref 0.0–0.2)

## 2021-09-29 LAB — PROTIME-INR
INR: 2.7 — ABNORMAL HIGH (ref 0.8–1.2)
Prothrombin Time: 28.7 seconds — ABNORMAL HIGH (ref 11.4–15.2)

## 2021-09-29 NOTE — ED Provider Notes (Signed)
[facility] EMERGENCY DEPARTMENT Provider Note   CSN: [id number] Arrival date & time: 09/29/21  1230     History Chief Complaint  Patient presents with   fall on thinners    Debra Wright is a 70 y.o. female.  Patient is a 70 year old female who presents after a fall.  She is on Coumadin for atrial fibrillation.  She says that she was walking to use the bathroom about 530 this morning and lost her balance.  She said she took a few steps backward and could not catch her self.  She fell backward and hit her head on a leg of a table.  She has a small hematoma to the back of her head.  She also complains of some pain in her left hip where she fell but she says she has been able to ambulate on it throughout the day without difficulty.  She denies any other injuries.  She said that she has had some gait disturbances for about the last 6 months and has an appointment to see a neurologist but does not notice any change in this recently.      Past Medical History:  Diagnosis Date   Anxiety    Arthritis    "knees; right thumb" (10/14/2014)   Basal cell carcinoma    "burned off upper lip & right shoulder"   Chronic anticoagulation    CHADS VASC=3   Complication of anesthesia    after spinal anesthesia for left knee replacement, bladder did "not wake up". Was incontinent for 4 days post surgery   Depression    Fibroid tumor    Frequent diarrhea    GERD (gastroesophageal reflux disease)    High cholesterol    Hypertension    EF 65-70% grade 2DD   Insomnia    Migraine    "frequently when I was younger; maybe monthly now" (10/14/2014) - none after hysterectomy   PAF (paroxysmal atrial fibrillation) ([place]) 05/02/2018   converted with rate control   Pernicious anemia    pt not aware of this   Sleep apnea    uses a cpap   Type II diabetes mellitus ([place])    Vitamin B 12 deficiency     Patient Active Problem List   Diagnosis Date Noted   Unilateral primary  osteoarthritis, right knee 12/10/2018   Status post total right knee replacement 12/10/2018   Long term (current) use of anticoagulants 08/16/2018   Hypertensive cardiovascular disease 06/04/2018   Morbid obesity with BMI of 40.0-44.9, adult ([place]) 06/04/2018   Fatigue 06/04/2018   Chronic anticoagulation    PAF (paroxysmal atrial fibrillation) ([place]) 05/02/2018   Primary osteoarthritis of right knee 06/14/2017   OSA (obstructive sleep apnea) 02/13/2017   NIDDM-type 2 10/31/2016   Essential hypertension 08/02/2016   Anxiety and depression 08/02/2016   Insomnia 08/02/2016   Arthritis of left knee 10/13/2014   Status post total left knee replacement 10/13/2014    Past Surgical History:  Procedure Laterality Date   ABDOMINAL HYSTERECTOMY  1988   ACHILLES TENDON SURGERY Right    APPENDECTOMY  1988   BILATERAL OOPHORECTOMY  2004   CARPAL TUNNEL RELEASE Bilateral 1986   CATARACT EXTRACTION W/ INTRAOCULAR LENS  IMPLANT, BILATERAL Bilateral 2011   COLONOSCOPY     JOINT REPLACEMENT     SHOULDER ARTHROSCOPY DISTAL CLAVICLE EXCISION AND OPEN ROTATOR CUFF REPAIR Right 11/2013   TOENAIL EXCISION Right 04/2018   "big toe"   TONSILLECTOMY  1950's   TOTAL  KNEE ARTHROPLASTY Left 10/13/2014   Procedure: LEFT TOTAL KNEE ARTHROPLASTY;  Surgeon: Mcarthur Rossetti, MD;  Location: [facility];  Service: Orthopedics;  Laterality: Left;   TOTAL KNEE ARTHROPLASTY Right 12/10/2018   Procedure: RIGHT TOTAL KNEE ARTHROPLASTY;  Surgeon: Mcarthur Rossetti, MD;  Location: [facility];  Service: Orthopedics;  Laterality: Right;     OB History   No obstetric history on file.     Family History  Problem Relation Age of Onset   Arthritis Mother    Hyperlipidemia Mother    Diabetes Mother    Hypertension Mother    Arthritis Father    Hyperlipidemia Father    Heart disease Father    Diabetes Father    Stroke Brother    Hypertension Brother    Diabetes Brother    Arthritis Maternal Grandmother     Hyperlipidemia Maternal Grandmother    Diabetes Maternal Grandmother    Hypertension Maternal Grandmother    Arthritis Maternal Grandfather    Hyperlipidemia Maternal Grandfather    Hypertension Maternal Grandfather    Arthritis Paternal Grandmother    Hyperlipidemia Paternal Grandmother    Diabetes Paternal Grandmother    Hypertension Paternal Grandmother    Arthritis Paternal Grandfather    Hyperlipidemia Paternal Grandfather    Diabetes Paternal Grandfather    Hypertension Paternal Grandfather     Social History   Tobacco Use   Smoking status: Never   Smokeless tobacco: Never  Vaping Use   Vaping Use: Never used  Substance Use Topics   Alcohol use: Yes    Comment: 10/14/2014 "might have a drink when I'm out once/month"   Drug use: No    Home Medications Prior to Admission medications   Medication Sig Start Date End Date Taking? Authorizing Provider  buPROPion (WELLBUTRIN XL) 150 MG 24 hr tablet Take 150 mg by mouth every morning. 09/06/21  Yes [provider]  calcium carbonate (TUMS - DOSED IN MG ELEMENTAL CALCIUM) 500 MG chewable tablet Chew 2 tablets by mouth daily as needed for indigestion or heartburn.   Yes [provider]  Calcium Carbonate-Vitamin D (CALCIUM 600 + D PO) Take 1 tablet by mouth daily.   Yes [provider]  cetirizine (ZYRTEC) 10 MG tablet Take 10 mg by mouth daily as needed for allergies.   Yes [provider]  CINNAMON PO Take 2 tablets by mouth daily.   Yes [provider]  citalopram (CELEXA) 40 MG tablet Take 1 tablet (40 mg total) by mouth daily. 10/14/20  Yes Hilts, Legrand Como, MD  clonazePAM (KLONOPIN) 0.5 MG tablet TAKE 1/2 TO 1 (ONE-HALF TO ONE) TABLET BY MOUTH AT BEDTIME AS NEEDED Patient taking differently: Take 0.25-0.5 mg by mouth daily as needed for anxiety. 11/30/20  Yes Hilts, Legrand Como, MD  Cyanocobalamin (VITAMIN B12) 1000 MCG TBCR Take 1,000 mcg by mouth daily.   Yes [provider]   diltiazem (CARTIA XT) 180 MG 24 hr capsule Take 1 capsule (180 mg total) by mouth daily. 07/29/21  Yes Troy Sine, MD  Eszopiclone 3 MG TABS Take 1 tablet (3 mg total) by mouth at bedtime as needed. Take immediately before bedtime Patient taking differently: Take 3 mg by mouth at bedtime as needed (sleep). Take immediately before bedtime 11/09/20  Yes Hilts, Michael, MD  Ferrous Sulfate (IRON) 325 (65 Fe) MG TABS Take 325 mg by mouth 2 (two) times daily.   Yes [provider]  magnesium oxide (MAG-OX) 400 (241.3 Mg) MG tablet Take 400  mg by mouth daily.   Yes [provider]  metFORMIN (GLUCOPHAGE) 1000 MG tablet Take 1 tablet (1,000 mg total) by mouth 2 (two) times daily with a meal. 02/15/21  Yes Hilts, Legrand Como, MD  metoprolol tartrate (LOPRESSOR) 25 MG tablet TAKE 1/2 TABLET BY MOUTH TWICE A DAY Patient taking differently: Take 12.5 mg by mouth 2 (two) times daily. 07/20/21  Yes Troy Sine, MD  Omega-3 Fatty Acids (FISH OIL) 1200 MG CAPS Take 1,200 mg by mouth 2 (two) times daily.   Yes [provider]  omeprazole (PRILOSEC) 40 MG capsule Take 1 capsule by mouth once daily 12/09/20  Yes Hilts, Legrand Como, MD  repaglinide (PRANDIN) 2 MG tablet Take 1 tablet (2 mg total) by mouth 2 (two) times daily before a meal. 02/07/21  Yes Renato Shin, MD  rosuvastatin (CRESTOR) 20 MG tablet Take 1 tablet (20 mg total) by mouth daily. 01/03/21 09/29/21 Yes Troy Sine, MD  spironolactone (ALDACTONE) 25 MG tablet Take 1/2 (one-half) tablet by mouth once daily Patient taking differently: Take 12.5 mg by mouth daily. Take 1/2 (one-half) tablet by mouth once daily 07/21/21  Yes Troy Sine, MD  tolterodine (DETROL) 2 MG tablet Take 2 mg by mouth 2 (two) times daily. 08/15/21  Yes [provider]  vitamin C (ASCORBIC ACID) 250 MG tablet Take 250 mg by mouth daily.   Yes [provider]  warfarin (COUMADIN) 5 MG tablet Take 1 to 1 and 1/2 tablets by mouth daily  or as directed by [facility]. Patient taking differently: Take 2.5-5 mg by mouth See admin instructions. Taking 5 mg daily except on Sunday taking 1/2 tablet ( 2.5 mg) 06/16/21  Yes Troy Sine, MD  Blood Glucose Monitoring Suppl (ACCU-CHEK GUIDE ME) w/Device KIT USE AS DIRECTED.  Dx E11.9. 11/10/20   Hilts, Legrand Como, MD  fluconazole (DIFLUCAN) 150 MG tablet Take 1 tablet (150 mg total) by mouth every three (3) days as needed. Patient not taking: Reported on 09/29/2021 11/24/20   Hilts, Michael, MD  glucose blood (ACCU-CHEK GUIDE) test strip CHECK BLOOD GLUCOSE IN THE MORNING AND AT BEDTIME.  E11.9 code. 11/26/20   Hilts, Legrand Como, MD  magic mouthwash (lidocaine, diphenhydrAMINE, alum & mag hydroxide) suspension Swish and spit 5 mLs 4 (four) times daily as needed for mouth pain. Patient not taking: Reported on 09/29/2021 12/13/20   Hilts, Legrand Como, MD  tiZANidine (ZANAFLEX) 2 MG tablet Take 1-2 tablets (2-4 mg total) by mouth every 6 (six) hours as needed for muscle spasms. Patient not taking: Reported on 09/29/2021 03/17/20   Hilts, Michael, MD  tolterodine (DETROL LA) 4 MG 24 hr capsule TAKE 2 CAPSULES BY MOUTH ONCE DAILY Patient not taking: Reported on 09/29/2021 09/29/20   Hilts, Legrand Como, MD    Allergies    Antivert [meclizine hcl], Meclizine, and Zolpidem  Review of Systems   Review of Systems  Constitutional:  Negative for chills, diaphoresis, fatigue and fever.  HENT:  Negative for congestion, rhinorrhea and sneezing.   Eyes: Negative.   Respiratory:  Negative for cough, chest tightness and shortness of breath.   Cardiovascular:  Negative for chest pain and leg swelling.  Gastrointestinal:  Negative for abdominal pain, blood in stool, diarrhea, nausea and vomiting.  Genitourinary:  Negative for difficulty urinating, flank pain, frequency and hematuria.  Musculoskeletal:  Positive for arthralgias. Negative for back pain.  Skin:  Negative for rash.  Neurological:  Positive for  headaches. Negative for dizziness, speech difficulty, weakness and numbness.  Physical Exam Updated Vital Signs BP 134/61    Pulse 62    Temp 98.9 F (37.2 C) (Oral)    Resp (!) 23    Ht '5\' 3"'  (1.6 m)    Wt 110 kg    SpO2 98%    BMI 42.96 kg/m   Physical Exam Constitutional:      Appearance: She is well-developed.  HENT:     Head: Normocephalic.     Comments: Small hematoma to the posterior scalp Eyes:     Pupils: Pupils are equal, round, and reactive to light.  Neck:     Comments: No pain to the cervical, thoracic or lumbosacral spine Cardiovascular:     Rate and Rhythm: Normal rate and regular rhythm.     Heart sounds: Normal heart sounds.  Pulmonary:     Effort: Pulmonary effort is normal. No respiratory distress.     Breath sounds: Normal breath sounds. No wheezing or rales.  Chest:     Chest wall: No tenderness.  Abdominal:     General: Bowel sounds are normal.     Palpations: Abdomen is soft.     Tenderness: There is no abdominal tenderness. There is no guarding or rebound.  Musculoskeletal:        General: Normal range of motion.     Comments: Mild pain on palpation of the posterior left hip.  There is no significant pain on range of motion of the hip.  Lymphadenopathy:     Cervical: No cervical adenopathy.  Skin:    General: Skin is warm and dry.     Findings: No rash.  Neurological:     General: No focal deficit present.     Mental Status: She is alert and oriented to person, place, and time.     Comments: Motor 5/5 all extremities Sensation grossly intact to LT all extremities Finger to Nose intact, no pronator drift CN II-XII grossly intact      ED Results / Procedures / Treatments   Labs (all labs ordered are listed, but only abnormal results are displayed) Labs Reviewed  PROTIME-INR - Abnormal; Notable for the following components:      Result Value   Prothrombin Time 28.7 (*)    INR 2.7 (*)    All other components within normal limits  I-STAT  CHEM 8, ED - Abnormal; Notable for the following components:   BUN 7 (*)    Glucose, Bld 140 (*)    Calcium, Ion 1.14 (*)    All other components within normal limits  CBC WITH DIFFERENTIAL/PLATELET    EKG EKG Interpretation  Date/Time:  Thursday September 29 2021 12:26:43 EST Ventricular Rate:  67 PR Interval:  192 QRS Duration: 90 QT Interval:  390 QTC Calculation: 412 R Axis:   -34 Text Interpretation: Normal sinus rhythm Left axis deviation Moderate voltage criteria for LVH, may be normal variant ( R in aVL , Cornell product ) Abnormal ECG When compared with ECG of 03-May-2018 12:31, PREVIOUS ECG IS PRESENT since last tracing no significant change Confirmed by Malvin Johns [phone]) on 09/29/2021 1:22:30 PM  Radiology CT Head Wo Contrast  Result Date: 09/29/2021 CLINICAL DATA:  Fall, striking head.  Anti coagulation. EXAM: CT HEAD WITHOUT CONTRAST CT CERVICAL SPINE WITHOUT CONTRAST TECHNIQUE: Multidetector CT imaging of the head and cervical spine was performed following the standard protocol without intravenous contrast. Multiplanar CT image reconstructions of the cervical spine were also generated. COMPARISON:  11/22/2012 brain MRI, and cervical spine radiographs  from 01/02/2017 FINDINGS: CT HEAD FINDINGS Brain: The brainstem, cerebellum, cerebral peduncles, thalami, basal ganglia, basilar cisterns, and ventricular system appear within normal limits. No intracranial hemorrhage, mass lesion, or acute CVA. Vascular: There is atherosclerotic calcification of the cavernous carotid arteries bilaterally. Skull: Unremarkable Sinuses/Orbits: Unremarkable Other: 0.7 cm in short axis oval-shaped soft tissue density lesion inferiorly in the right parotid gland. CT CERVICAL SPINE FINDINGS Alignment: Mild reversal of the normal cervical lordosis. Skull base and vertebrae: Minimal superior spurring at the anterior C1-2 articulation. No cervical spine fracture or acute bony findings. Soft tissues and  spinal canal: Atherosclerotic calcification of the aortic arch and branch vessels including the left common carotid artery. Disc levels:  No bony impingement identified. Upper chest: Unremarkable Other: No supplemental non-categorized findings. IMPRESSION: 1. No acute intracranial findings are acute cervical spine findings. 2. Small mass or mildly enlarged lymph node in the right parotid gland, 0.7 cm in short axis. Significance uncertain, consider otolaryngology referral. 3.  Aortic Atherosclerosis (ICD10-I70.0). Electronically Signed   By: Van Clines M.D.   On: 09/29/2021 13:46   CT Cervical Spine Wo Contrast  Result Date: 09/29/2021 CLINICAL DATA:  Fall, striking head.  Anti coagulation. EXAM: CT HEAD WITHOUT CONTRAST CT CERVICAL SPINE WITHOUT CONTRAST TECHNIQUE: Multidetector CT imaging of the head and cervical spine was performed following the standard protocol without intravenous contrast. Multiplanar CT image reconstructions of the cervical spine were also generated. COMPARISON:  11/22/2012 brain MRI, and cervical spine radiographs from 01/02/2017 FINDINGS: CT HEAD FINDINGS Brain: The brainstem, cerebellum, cerebral peduncles, thalami, basal ganglia, basilar cisterns, and ventricular system appear within normal limits. No intracranial hemorrhage, mass lesion, or acute CVA. Vascular: There is atherosclerotic calcification of the cavernous carotid arteries bilaterally. Skull: Unremarkable Sinuses/Orbits: Unremarkable Other: 0.7 cm in short axis oval-shaped soft tissue density lesion inferiorly in the right parotid gland. CT CERVICAL SPINE FINDINGS Alignment: Mild reversal of the normal cervical lordosis. Skull base and vertebrae: Minimal superior spurring at the anterior C1-2 articulation. No cervical spine fracture or acute bony findings. Soft tissues and spinal canal: Atherosclerotic calcification of the aortic arch and branch vessels including the left common carotid artery. Disc levels:  No  bony impingement identified. Upper chest: Unremarkable Other: No supplemental non-categorized findings. IMPRESSION: 1. No acute intracranial findings are acute cervical spine findings. 2. Small mass or mildly enlarged lymph node in the right parotid gland, 0.7 cm in short axis. Significance uncertain, consider otolaryngology referral. 3.  Aortic Atherosclerosis (ICD10-I70.0). Electronically Signed   By: Van Clines M.D.   On: 09/29/2021 13:46   DG Hip Unilat W or Wo Pelvis 2-3 Views Left  Result Date: 09/29/2021 CLINICAL DATA:  Left posterior hip pain EXAM: DG HIP (WITH OR WITHOUT PELVIS) 2-3V LEFT COMPARISON:  Hip radiographs 02/07/2018 FINDINGS: Fragmentation adjacent to the left greater trochanter is unchanged since 2019. There is no evidence of acute fracture or dislocation. Femoroacetabular alignment is normal. There is mild degenerative change of both hips. The SI joints and symphysis pubis are intact. The soft tissues are unremarkable. IMPRESSION: No acute fracture or dislocation. Electronically Signed   By: Valetta Mole M.D.   On: 09/29/2021 13:46    Procedures Procedures   Medications Ordered in ED Medications - No data to display  ED Course  I have reviewed the triage vital signs and the nursing notes.  Pertinent labs & imaging results that were available during my care of the patient were reviewed by me and considered in my medical decision making (  see chart for details).    MDM Rules/Calculators/A&P                         Patient is a 70 year old female who presents after a fall earlier this morning.  She is on Coumadin.  Her INR is in the therapeutic range.  Her other labs are nonconcerning.  She had a CT scan of her head and cervical spine which showed no acute abnormalities.  X-rays of her left hip show no acute fractures or other abnormalities.  She is able to bear weight so I doubt she has an occult fracture.  She was discharged home in good condition.  She was  instructed that she had a small growth in her parotid gland that we will need outpatient follow-up by ENT and was given a referral to ENT.  Head injury precautions and return precautions were given.    Final Clinical Impression(s) / ED Diagnoses Final diagnoses:  Injury of head, initial encounter  Parotid mass    Rx / DC Orders ED Discharge Orders     None        Malvin Johns, MD 09/29/21 1444

## 2021-09-29 NOTE — ED Triage Notes (Signed)
Pt here via POV with c/o of fall on thinner. Warfarin. Pt got up around 0530 to use restroom. Lost balance, fell backward hitting head on bedside table leg. Pt put icepack on at home. No notable injury noted to head. Palpated possible small bump. Pt states she hit her left hip which is sore. Alert and oriented X4. No LOC reported.

## 2021-09-29 NOTE — Discharge Instructions (Signed)
Follow-up with the ear nose and throat doctor listed above regarding a small mass in your parotid gland.  Return to emergency room if you have any worsening symptoms including worsening headache, vomiting, difficulty with her balance or other worsening symptoms.

## 2021-09-29 NOTE — ED Notes (Signed)
Back from CT

## 2021-09-29 NOTE — ED Notes (Signed)
Pt verbalized understanding of d/c instructions, meds and followup care. Denies questions. VSS, no distress noted. W/C to exit with all belongings.  

## 2021-09-29 NOTE — ED Notes (Signed)
Pt to CT

## 2021-09-29 NOTE — Telephone Encounter (Signed)
Pt called and stated that she had a fall this morning and hit her head. Pt stated that she has a "bump" on her head that is not real big. Pt stated that she is not feeling weaker on one side. Instructed pt that she will need to go to the ER to have a scan of her head to make sure that there is not internal bleeding. Informed her that even if you don't see any blood, she will need to be evaluated to make sure that there is not any slow bleeding internally.

## 2021-09-29 NOTE — Progress Notes (Signed)
This chaplain responded to page for Level 2 trauma in Room 17.  The chaplain observed the Pt. talking to the medical team and joined by a companion.  The chaplain checked in with the RN and offered F/U spiritual care as needed.  Chaplain Sallyanne Kuster

## 2021-10-03 ENCOUNTER — Other Ambulatory Visit: Payer: Self-pay | Admitting: Cardiovascular Disease

## 2021-10-03 DIAGNOSIS — I480 Paroxysmal atrial fibrillation: Secondary | ICD-10-CM

## 2021-10-04 ENCOUNTER — Other Ambulatory Visit: Payer: Self-pay | Admitting: Cardiovascular Disease

## 2021-10-04 DIAGNOSIS — I480 Paroxysmal atrial fibrillation: Secondary | ICD-10-CM

## 2021-10-04 NOTE — Progress Notes (Addendum)
Assessment/Plan:   Debra Wright is a very pleasant 71 y.o. year old RH female with risk factors including  age, hypertension, hyperlipidemia, anxiety, depression, OSA on CPAP, panic disorder, insomnia, B12 deficiency, iron deficiency anemia, DM type II, and PAF on AC seen today for evaluation of cognitive difficulties. MoCA today 26/30 with delayed recall 5/5, visuospatial executive 3/5, rest of the MoCA normal.    Recommendations:   Cognitive difficulties of unclear etiology MRI brain with/without contrast to assess for underlying structural abnormality and assess vascular load  Discussed the importance of regular daily schedule with inclusion of crossword puzzles to maintain brain function.  Continue to monitor mood with PCP.  Recommend CBT/psychotherapy Stay active at least 30 minutes at least 3 times a week.  Naps should be scheduled and should be no longer than 60 minutes and should not occur after 2 PM.  Mediterranean diet is recommended   Balance difficulty, unclear etiology  Unclear etiology, unable to reproduce the balance issues during exam.  She reports that this is intermittent, and at times, she walks backwards because she is unable to control.  Patient has a history of inner ear abnormalities, without vertigo, nausea, vomiting, dizziness, recent infections, or vision changes.  Patient has an appointment with ENT next week as referred by her PCP. Rule out functional etiology  MRI of the brain to assess for underlying structural abnormality and assess vascular load If MRI of the brain is normal, no follow-up with neurology is indicated  Case has been discussed with Dr. Delice Lesch who agrees with the plan  Subjective:    The patient is seen in neurologic consultation at the request of Debra Huxley, PA for the evaluation of memory.  The patient is here alone.  This is a very pleasant 71 y.o. 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  She first felt that this was due to depression and recent stress.  MMSE at the PCPs office was normal at 30/30.  CT of the head was normal.  She finds herself repeating the same stories and asking the same questions.  She becomes overwhelmed if she has to do different activities within the house.  For example, she was trying to clean a part of her house, was going to get her husband a cup of coffee, and then forgot to get the coffee.  She has gone to appointments on the wrong date, including yesterday when she came to this appointment thinking that it was not today.  Additional stressors include her daughter-in-law moving in with the patient until she finds a new home.  She states that she can remember directions how to get somewhere, but she cannot remember the name of the places or stores.  She continues to drive without difficulties.  She has a history of major depression and anxiety, currently on Celexa and clonazepam as well as Wellbutrin by PCP, despite days, she continues to feel tired and down.  She does not sleep well, occasionally, she describes her vivid dreams as "I am somewhere else when I am in the twilight ".  She denies any hallucinations or paranoia.  No hygiene concerns, but she did does not have the desired to dress up sometimes because "I do not have anywhere to go ".  She is independent of bathing and dressing.  Her medications are placed in the pillbox.  She  is in charge of her finances.  Her appetite is good, denies any trouble swallowing.  She cooks, denies leaving the stove or the faucet on.  As for her ambulation issues, she uses a cane due to "balance issues when walking outside ". This started recently. She has a history of inner ear pressure with gait problems, seen in the past by ENT, and has an appointment next week as well.  She denies any headache, nausea, vomiting, stroke, brain injury, prolonged  intubation, vertigo or dizziness.  No history of a stroke.  "I just feel wobbly ".  Denies any changes in medications, blurred vision or double vision.  She never had a history of Mnire's disease.  She denies history of kidney or liver failure, drug poisoning, autoimmune inflammatory conditions, polypharmacy, Parkinson's  or spinal cord injury.  She has a history of atrial fibrillation, denies any palpitations or chest pain at this time.  She is on beta-blockers.  Denies any history of seizures.  The patient has a history of bilateral TKA, left in 2021, and right 5 years ago, she does not feel completely stable from the knees all the time.  She recently had a mechanical fall after slipping in the garden after the storm.  On 09/29/2021, she had a mechanical fall after losing balance in the bathroom, when she began to walk backwards involuntarily "I do not know why I keep walking backwards ", then trying to catch herself, but unable to do so.  She ended up hitting her head on the leg of the table, sustaining a small hematoma in the occipital area, without requiring stitches.  No loss of consciousness.  She states that she is unsure why this happens, where she cannot control that.  She denies ataxia or apraxia.  Denies a history of alcohol or tobacco abuse.  She has been retired for 3 years.    TSH 2.55, B12 154 (low) on 07/07/2021 on B12 replenishment by PCP    CT head 09/29/21 1. No acute intracranial findings are acute cervical spine findings. 2. Small mass or mildly enlarged lymph node in the right parotid  gland, 0.7 cm in short axis. Significance uncertain, consider otolaryngology referral. 3.  Aortic Atherosclerosis (ICD10-I70.0).  CT of the C-spine 09/29/2021 without acute findings  EKG 09/29/2021 QT 390, QTc 412, rate 67, normal sinus rhythm    Allergies  Allergen Reactions   Antivert [Meclizine Hcl] Other (See Comments)    "makes me feel like my skin is falling off".  03/05/20 pt states  she was placed on this a few months back and did not have a reaction   Meclizine Other (See Comments)    "makes me feel like my skin is falling off".  03/05/20 pt states she was placed on this a few months back and did not have a reaction   Zolpidem Other (See Comments)    Reverse affect    Current Outpatient Medications  Medication Instructions   Blood Glucose Monitoring Suppl (ACCU-CHEK GUIDE ME) w/Device KIT USE AS DIRECTED.  Dx E11.9.   buPROPion (WELLBUTRIN XL) 150 mg, Oral, Every morning   calcium carbonate (TUMS - DOSED IN MG ELEMENTAL CALCIUM) 500 MG chewable tablet 2 tablets, Oral, Daily PRN   Calcium Carbonate-Vitamin D (CALCIUM 600 + D PO) 1 tablet, Daily   cetirizine (ZYRTEC) 10 mg, Oral, Daily PRN   CINNAMON PO 2 tablets, Oral, Daily   citalopram (CELEXA) 40 mg, Oral, Daily   clonazePAM (KLONOPIN) 0.5 MG tablet TAKE 1/2  TO 1 (ONE-HALF TO ONE) TABLET BY MOUTH AT BEDTIME AS NEEDED   diazepam (VALIUM) 5 MG tablet Take 1 tablet 5 mg 30 mins prior to the MRI, may atake a 2nd tab id need right before the procedure   diltiazem (CARTIA XT) 180 mg, Oral, Daily   Eszopiclone 3 mg, Oral, At bedtime PRN, Take immediately before bedtime   Fish Oil 1,200 mg, Oral, 2 times daily   fluconazole (DIFLUCAN) 150 mg, Oral, Every 3 DAYS PRN   glucose blood (ACCU-CHEK GUIDE) test strip CHECK BLOOD GLUCOSE IN THE MORNING AND AT BEDTIME.  E11.9 code.   Iron 325 mg, Oral, 2 times daily   magic mouthwash (lidocaine, diphenhydrAMINE, alum & mag hydroxide) suspension 5 mLs, Swish & Spit, 4 times daily PRN   magnesium oxide (MAG-OX) 400 mg, Oral, Daily   metFORMIN (GLUCOPHAGE) 1,000 mg, Oral, 2 times daily with meals   metoprolol tartrate (LOPRESSOR) 25 MG tablet TAKE 1/2 TABLET BY MOUTH TWICE A DAY   omeprazole (PRILOSEC) 40 MG capsule Take 1 capsule by mouth once daily   repaglinide (PRANDIN) 2 mg, Oral, 2 times daily before meals   rosuvastatin (CRESTOR) 20 mg, Oral, Daily   spironolactone  (ALDACTONE) 25 MG tablet Take 1/2 (one-half) tablet by mouth once daily   tiZANidine (ZANAFLEX) 2-4 mg, Oral, Every 6 hours PRN   tolterodine (DETROL LA) 4 MG 24 hr capsule TAKE 2 CAPSULES BY MOUTH ONCE DAILY   tolterodine (DETROL) 2 mg, Oral, 2 times daily   Vitamin B12 1,000 mcg, Oral, Daily   vitamin C (ASCORBIC ACID) 250 mg, Oral, Daily   warfarin (COUMADIN) 5 MG tablet TAKE 1/2 TO 1 TABLET BY MOUTH EVERY DAY AS DIRECTED BY CLINIC     VITALS:   Vitals:   10/05/21 1336  BP: (!) 143/64  Pulse: 68  SpO2: 94%  Weight: 238 lb (108 kg)  Height: _0  (1.6 m)     PHYSICAL EXAM   HEENT:  Normocephalic, atraumatic. The mucous membranes are moist. The superficial temporal arteries are without ropiness or tenderness. Cardiovascular: Regular rate and rhythm. Lungs: Clear to auscultation bilaterally. Neck: There are no carotid bruits noted bilaterally.  NEUROLOGICAL: Montreal Cognitive Assessment  10/05/2021  Visuospatial/ Executive (0/5) 3  Naming (0/3) 3  Attention: Read list of digits (0/2) 2  Attention: Read list of letters (0/1) 1  Attention: Serial 7 subtraction starting at 100 (0/3) 3  Language: Repeat phrase (0/2) 1  Language : Fluency (0/1) 1  Abstraction (0/2) 2  Delayed Recall (0/5) 5  Orientation (0/6) 5  Total 26  Adjusted Score (based on education) 26   MMSE - Mini Mental State Exam 01/03/2017  Not completed: (No Data)    No flowsheet data found.   Orientation:  Alert and oriented to person, place and time. No aphasia or dysarthria. Fund of knowledge is appropriate. Recent memory impaired and remote memory intact.  Attention and concentration are normal.  Able to name objects and repeat phrases. Delayed recall  /55 Cranial nerves: There is good facial symmetry. Extraocular muscles are intact and visual fields are full to confrontational testing. Speech is fluent and clear. Soft palate rises symmetrically and there is no tongue deviation. Hearing is intact to  conversational tone. Tone: Tone is good throughout. Sensation: Sensation is intact to light touch and pinprick throughout. Vibration is intact at the bilateral big toe.There is no extinction with double simultaneous stimulation. There is no sensory dermatomal level identified. Coordination: The patient has  no difficulty with RAM's or FNF bilaterally. Normal finger to nose  Motor: Strength is 5/5 in the bilateral upper and lower extremities. There is no pronator drift. There are no fasciculations noted. DTR's: Deep tendon reflexes are 2/4 at the bilateral biceps, triceps, brachioradialis, patella and achilles.  Plantar responses are downgoing bilaterally. Gait and Station: The patient is able to ambulate with some difficulty , but unable to reproduce the involuntary backwards movements described by HPI.  USes a R cane for balance.The patient is able to ambulate in a tandem fashion. The patient is able to stand in the Romberg position.     Thank you for allowing [place] the opportunity to participate in the care of this nice patient. Please do not hesitate to contact us for any questions or concerns.   Total time spent on today's visit was 60 minutes, including both face-to-face time and nonface-to-face time.  Time included that spent on review of records (prior notes available to me/labs/imaging if pertinent), discussing treatment and goals, answering patient's questions and coordinating care.  Cc:  Debra Wright, [place]  Sharene Butters 10/05/2021 3:23 PM

## 2021-10-04 NOTE — Telephone Encounter (Signed)
Prescription refill request received for warfarin [facility]: 06/27/21 [facility])  Next INR check: 10/05/21 Warfarin tablet strength: 5mg   Appropriate dose and refill sent to requested pharmacy.

## 2021-10-05 ENCOUNTER — Ambulatory Visit (INDEPENDENT_AMBULATORY_CARE_PROVIDER_SITE_OTHER): Payer: Medicare Other | Admitting: Physician Assistant

## 2021-10-05 ENCOUNTER — Other Ambulatory Visit: Payer: Self-pay

## 2021-10-05 ENCOUNTER — Encounter: Payer: Self-pay | Admitting: Physician Assistant

## 2021-10-05 VITALS — BP 143/64 | HR 68 | Ht 63.0 in | Wt 238.0 lb

## 2021-10-05 DIAGNOSIS — R413 Other amnesia: Secondary | ICD-10-CM

## 2021-10-05 MED ORDER — DIAZEPAM 5 MG PO TABS: ORAL_TABLET | ORAL | 0 refills | Status: DC

## 2021-10-05 NOTE — Patient Instructions (Signed)
We have sent a referral to [facility] for your MRI and they will call you directly to schedule your appointment. They are located at [place]. If you need to contact them directly please call [phone].

## 2021-10-06 ENCOUNTER — Telehealth: Payer: Self-pay

## 2021-10-06 ENCOUNTER — Ambulatory Visit (INDEPENDENT_AMBULATORY_CARE_PROVIDER_SITE_OTHER): Payer: Medicare Other | Admitting: Cardiology

## 2021-10-06 DIAGNOSIS — Z7901 Long term (current) use of anticoagulants: Secondary | ICD-10-CM

## 2021-10-06 DIAGNOSIS — I4891 Unspecified atrial fibrillation: Secondary | ICD-10-CM

## 2021-10-06 LAB — POCT INR: INR: 2.6 (ref 2.0–3.0)

## 2021-10-06 NOTE — Telephone Encounter (Signed)
Lpm to check INR 

## 2021-10-07 ENCOUNTER — Telehealth: Payer: Self-pay | Admitting: Cardiovascular Disease

## 2021-10-07 NOTE — Telephone Encounter (Addendum)
Patient with diagnosis of afib on warfarin for anticoagulation.    Procedure: ESI Date of procedure: 10/13/21  CHA2DS2-VASc Score = 4  This indicates a 4.8% annual risk of stroke. The patient's score is based upon: CHF History: 0 HTN History: 1 Diabetes History: 1 Stroke History: 0 Vascular Disease History: 0 Age Score: 1 Gender Score: 1   CrCl 10mL/min using adjusted body weight Platelet count 313K  Per office protocol, patient can hold warfarin for 5 days prior to procedure. Last dose of warfarin is today, she needs to start holding tomorrow.  Tried to message pre op team since clearance request was last minute and pt needs instructions today. States they are offline. Called pt to advise her of need to start holding warfarin tomorrow, she did not answer. I left her a detailed message.

## 2021-10-07 NOTE — Telephone Encounter (Signed)
° °  Pre-operative Risk Assessment    Patient Name: Debra Wright  DOB: 1951/09/04 MRN: [id number]      Request for Surgical Clearance    Procedure:   ESI L5-S1  Date of Surgery:  Clearance 10/13/21                                 Surgeon:  Lenord Carbo MD  Surgeon's Group or Practice Name:  [facility] Phone number:  [phone] Fax number:  [phone]   Type of Clearance Requested:   - Medical  - Pharmacy: hold warfarin 5 days   Type of Anesthesia:  Not Indicated   Additional requests/questions: n/a  Lonia Chimera   10/07/2021, 4:53 PM

## 2021-10-07 NOTE — Telephone Encounter (Signed)
Attempted to contact patient as part of preoperative protocol.  Reviewed pharmacy recommendations in voice message.  Will reach out to patient 10/10/2021 to verify recommendations and screen for cardiac concerns. As long as patient remains stable from a cardiac standpoint she may be cleared for ESI.  Jossie Ng. Cilicia Borden NP-C    10/07/2021, 7:42 PM [place] [address] Office [phone] Fax [phone]

## 2021-10-10 NOTE — Telephone Encounter (Signed)
° °  Primary Cardiologist: Shelva Majestic, MD  Chart reviewed as part of pre-operative protocol coverage. Given past medical history and time since last visit, based on ACC/AHA guidelines, Debra Wright would be at acceptable risk for the planned procedure without further cardiovascular testing.   Patient reports that she started holding her Coumadin on 10/09/2021.  Patient with diagnosis of afib on warfarin for anticoagulation.     Procedure: ESI Date of procedure: 10/13/21   CHA2DS2-VASc Score = 4  This indicates a 4.8% annual risk of stroke. The patient's score is based upon: CHF History: 0 HTN History: 1 Diabetes History: 1 Stroke History: 0 Vascular Disease History: 0 Age Score: 1 Gender Score: 1   CrCl 77mL/min using adjusted body weight Platelet count 313K   Per office protocol, patient can hold warfarin for 5 days prior to procedure.   Patient was advised that if she develops new symptoms prior to surgery to contact our office to arrange a follow-up appointment.  She verbalized understanding.  I will route this recommendation to the requesting party via Epic fax function and remove from pre-op pool.  Please call with questions.  Jossie Ng. Rakiyah Esch NP-C    10/10/2021, 3:40 PM [place] [address] Office [phone] Fax [phone]

## 2021-10-10 NOTE — Telephone Encounter (Signed)
Left message to call back 10/10/2021 at 0 659.  Patient needs call back.

## 2021-10-13 DIAGNOSIS — M5416 Radiculopathy, lumbar region: Secondary | ICD-10-CM | POA: Diagnosis not present

## 2021-10-20 ENCOUNTER — Telehealth: Payer: Self-pay

## 2021-10-20 DIAGNOSIS — D37030 Neoplasm of uncertain behavior of the parotid salivary glands: Secondary | ICD-10-CM | POA: Diagnosis not present

## 2021-10-20 LAB — POCT INR: INR: 2.4 (ref 2.0–3.0)

## 2021-10-20 NOTE — Telephone Encounter (Signed)
Lpm to check INR 

## 2021-10-21 ENCOUNTER — Ambulatory Visit (INDEPENDENT_AMBULATORY_CARE_PROVIDER_SITE_OTHER): Payer: Medicare Other | Admitting: Cardiology

## 2021-10-21 DIAGNOSIS — Z7901 Long term (current) use of anticoagulants: Secondary | ICD-10-CM

## 2021-10-31 ENCOUNTER — Ambulatory Visit
Admission: RE | Admit: 2021-10-31 | Discharge: 2021-10-31 | Disposition: A | Discharge status: Home / Self Care | Payer: Medicare Other | Source: Ambulatory Visit | Attending: Physician Assistant | Admitting: Physician Assistant

## 2021-10-31 ENCOUNTER — Other Ambulatory Visit: Payer: Self-pay

## 2021-10-31 DIAGNOSIS — R413 Other amnesia: Secondary | ICD-10-CM | POA: Diagnosis not present

## 2021-10-31 DIAGNOSIS — I6782 Cerebral ischemia: Secondary | ICD-10-CM | POA: Diagnosis not present

## 2021-10-31 DIAGNOSIS — G319 Degenerative disease of nervous system, unspecified: Secondary | ICD-10-CM | POA: Diagnosis not present

## 2021-10-31 DIAGNOSIS — I639 Cerebral infarction, unspecified: Secondary | ICD-10-CM | POA: Diagnosis not present

## 2021-10-31 MED ORDER — GADOBENATE DIMEGLUMINE 529 MG/ML IV SOLN
20.0000 mL | Freq: Once | INTRAVENOUS | Status: AC | PRN
  Administered 2021-10-31: 20 mL via INTRAVENOUS

## 2021-11-03 ENCOUNTER — Telehealth: Payer: Self-pay

## 2021-11-03 ENCOUNTER — Encounter: Payer: Self-pay | Admitting: Cardiovascular Disease

## 2021-11-03 ENCOUNTER — Ambulatory Visit (INDEPENDENT_AMBULATORY_CARE_PROVIDER_SITE_OTHER): Payer: Medicare Other | Admitting: Cardiovascular Disease

## 2021-11-03 DIAGNOSIS — Z7901 Long term (current) use of anticoagulants: Secondary | ICD-10-CM | POA: Diagnosis not present

## 2021-11-03 DIAGNOSIS — I480 Paroxysmal atrial fibrillation: Secondary | ICD-10-CM | POA: Diagnosis not present

## 2021-11-03 LAB — POCT INR: INR: 2.3 (ref 2.0–3.0)

## 2021-11-03 NOTE — Progress Notes (Signed)
Hello [name], wanted to follow up that the  tiny growth in the right parotid gland is seen again on the MRI, suspicious for neoplasm, that will need a follow up with possible ENT for biopsy biopsy for duagnosis. Thanks

## 2021-11-03 NOTE — Telephone Encounter (Signed)
I reminded patient to check INR.  Verbalized understanding.

## 2021-11-06 NOTE — Progress Notes (Deleted)
Cardiology Office Note:    Date:  11/06/2021   ID:  [name], [name] Apr 07, 1951, MRN [id number]  PCP:  [name], [place] Providers Cardiologist:  Shelva Majestic, MD { Click to update primary MD,subspecialty MD or APP then REFRESH:1}    Referring MD: [name], PA   For evaluation of weakness and irregular heartbeat.  History of Present Illness:    Debra Wright is a 71 y.o. female with a hx of HTN, type 2 diabetes, OSA on CPAP, hyperlipidemia, PAF, and anxiety.  She received a steroid injection in her right knee 8/19.  Upon return home she had acute onset of palpitations associated with chest discomfort and diaphoresis.  EMS presented and tracing showed atrial fibrillation with RVR.  While in the emergency room her heart rate was between 150 and 180.  She was given IV metoprolol and converted to sinus rhythm.  Her Norvasc was changed to diltiazem.  Echocardiogram showed normal LV function, G2 DD, aortic valve calcification without stenosis, mild-moderate LAE.  A nuclear stress test 8/19 was negative for ischemia.  Her metoprolol was later decreased due to bradycardia.  She was seen by leuko-Roy PA-C 12/26/2019.  During that time she complained of dizziness with turning her head.  She was given meclizine at that time.  She followed up with Almyra Deforest PA-C on 06/27/2021.  During that time she denied chest discomfort and worsening dyspnea.  She continued to monitor her Coumadin level at home.  She reported her INR was 5.3 on 06/22/2021.  She had been holding her Coumadin since 924 in preparation for back injection.  She was not noted to have chest discomfort, worsening dyspnea and was cleared for injection.  Her previous recommendation for prior injection was to hold her Coumadin for 5 days.  Her recent INRs were reviewed with clinical pharmacist and recommendation to recheck Coumadin level that night and send result to Coumadin clinic to make sure her INR was appropriate for  surgery was made.  Her insurance did not cover Aubagio.  She was using over-the-counter fish oil.  She was started on Vascepa 2 mg twice a day to replace her fish oil.  She presents the clinic today for evaluation of her irregular heartbeat and weakness.  She states***  *** denies chest pain, shortness of breath, lower extremity edema, fatigue, palpitations, melena, hematuria, hemoptysis, diaphoresis, weakness, presyncope, syncope, orthopnea, and PND.   Past Medical History:  Diagnosis Date   Anxiety    Arthritis    "knees; right thumb" (10/14/2014)   Basal cell carcinoma    "burned off upper lip & right shoulder"   Chronic anticoagulation    CHADS VASC=3   Complication of anesthesia    after spinal anesthesia for left knee replacement, bladder did "not wake up". Was incontinent for 4 days post surgery   Depression    Fibroid tumor    Frequent diarrhea    GERD (gastroesophageal reflux disease)    High cholesterol    Hypertension    EF 65-70% grade 2DD   Insomnia    Migraine    "frequently when I was younger; maybe monthly now" (10/14/2014) - none after hysterectomy   PAF (paroxysmal atrial fibrillation) ([place]) 05/02/2018   converted with rate control   Pernicious anemia    pt not aware of this   Sleep apnea    uses a cpap   Type II diabetes mellitus ([place])    Vitamin B 12  deficiency     Past Surgical History:  Procedure Laterality Date   ABDOMINAL HYSTERECTOMY  1988   ACHILLES TENDON SURGERY Right    APPENDECTOMY  1988   BILATERAL OOPHORECTOMY  2004   CARPAL TUNNEL RELEASE Bilateral 1986   CATARACT EXTRACTION W/ INTRAOCULAR LENS  IMPLANT, BILATERAL Bilateral 2011   COLONOSCOPY     JOINT REPLACEMENT     SHOULDER ARTHROSCOPY DISTAL CLAVICLE EXCISION AND OPEN ROTATOR CUFF REPAIR Right 11/2013   TOENAIL EXCISION Right 04/2018   "big toe"   TONSILLECTOMY  1950's   TOTAL KNEE ARTHROPLASTY Left 10/13/2014   Procedure: LEFT TOTAL KNEE ARTHROPLASTY;  Surgeon: Mcarthur Rossetti, MD;  Location: [facility];  Service: Orthopedics;  Laterality: Left;   TOTAL KNEE ARTHROPLASTY Right 12/10/2018   Procedure: RIGHT TOTAL KNEE ARTHROPLASTY;  Surgeon: Mcarthur Rossetti, MD;  Location: [facility];  Service: Orthopedics;  Laterality: Right;    Current Medications: No outpatient medications have been marked as taking for the 11/08/21 encounter (Appointment) with [name], NP.     Allergies:   Antivert [meclizine hcl], Meclizine, and Zolpidem   Social History   Socioeconomic History   Marital status: Married    Spouse name: Not on file   Number of children: 1   Years of education: 14   Highest education level: Not on file  Occupational History   Not on file  Tobacco Use   Smoking status: Never   Smokeless tobacco: Never  Vaping Use   Vaping Use: Never used  Substance and Sexual Activity   Alcohol use: Yes    Comment: 10/14/2014 "might have a drink when I'm out once/month"   Drug use: No   Sexual activity: Not Currently  Other Topics Concern   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, sports for [employer]   Married       Right handed    Drinks caffeine one cup of coffee daily   An apartment    Social Determinants of Radio broadcast assistant Strain: Not on Art therapist Insecurity: Not on file  Transportation Needs: Not on file  Physical Activity: Not on file  Stress: Not on file  Social Connections: Not on file     Family History: The patient's ***family history includes Arthritis in her father, maternal grandfather, maternal grandmother, mother, paternal grandfather, and paternal grandmother; Diabetes in her brother, father, maternal grandmother, mother, paternal grandfather, and paternal grandmother; Heart disease in her father; Hyperlipidemia in her father, maternal grandfather, maternal grandmother, mother, paternal grandfather, and paternal grandmother; Hypertension in her brother, maternal grandfather, maternal  grandmother, mother, paternal grandfather, and paternal grandmother; Stroke in her brother.  ROS:   Please see the history of present illness.    *** All other systems reviewed and are negative.   Risk Assessment/Calculations:   {Does this patient have ATRIAL FIBRILLATION?:[phone]}       Physical Exam:    VS:  There were no vitals taken for this visit.    Wt Readings from Last 3 Encounters:  10/05/21 238 lb (108 kg)  09/29/21 243 lb (110.2 kg)  09/20/21 243 lb (110.2 kg)     GEN: *** Well nourished, well developed in no acute distress HEENT: Normal NECK: No JVD; No carotid bruits LYMPHATICS: No lymphadenopathy CARDIAC: ***RRR, no murmurs, rubs, gallops RESPIRATORY:  Clear to auscultation without rales, wheezing or rhonchi  ABDOMEN: Soft, non-tender, non-distended MUSCULOSKELETAL:  No edema;  No deformity  SKIN: Warm and dry NEUROLOGIC:  Alert and oriented x 3 PSYCHIATRIC:  Normal affect    EKGs/Labs/Other Studies Reviewed:    The following studies were reviewed today:  Echocardiogram 05/03/2018 Study Conclusions   - Left ventricle: The cavity size was normal. There was moderate    concentric hypertrophy. Systolic function was vigorous. The    estimated ejection fraction was in the range of 65% to 70%. Wall    motion was normal; there were no regional wall motion    abnormalities. Features are consistent with a pseudonormal left    ventricular filling pattern, with concomitant abnormal relaxation    and increased filling pressure (grade 2 diastolic dysfunction).    Doppler parameters are consistent with high ventricular filling    pressure.  - Aortic valve: Trileaflet; mildly thickened, mildly calcified    leaflets.  - Aorta: Aortic root dimension: 37 mm (ED).  - Mitral valve: Calcified annulus.  - Left atrium: The atrium was mildly to moderately dilated.  - Pulmonary arteries: Systolic pressure could not be accurately    estimated.   EKG:  EKG is *** ordered  today.  The ekg ordered today demonstrates ***  Recent Labs: 09/29/2021: BUN 7; Creatinine, Ser 0.70; Hemoglobin 12.6; Platelets 313; Potassium 4.0; Sodium 137  Recent Lipid Panel    Component Value Date/Time   CHOL 138 08/07/2018 1530   TRIG 185 (H) 08/07/2018 1530   HDL 46 (L) 08/07/2018 1530   CHOLHDL 3.0 08/07/2018 1530   VLDL 22 05/03/2018 0153   LDLCALC 66 08/07/2018 1530   LDLDIRECT 42.0 01/31/2018 0943    ASSESSMENT & PLAN    Paroxysmal atrial fibrillation-EKG today shows***.  Has noticed irregular heartbeat over the past several days.  Reports compliance with her diltiazem.  Reports compliance with Coumadin and denies bleeding issues.  Patient tests INR at home.  CHA2DS2-VASc score 4 (hypertension, diabetes, age, gender) previously told by Kerin Ransom PA-C that she may take an extra metoprolol for irregular heartbeats.  This is helped reduce her symptoms. Continue diltiazem, metoprolol, Coumadin Heart healthy low-sodium diet-salty 6 given Increase physical activity as tolerated Avoid triggers caffeine, chocolate, EtOH, dehydration etc. Order CBC, BMP  Essential hypertension-BP today***.  Well-controlled at home. Continue metoprolol, diltiazem, spironolactone Heart healthy low-sodium diet Increase physical activity as tolerated  Hyperlipidemia-LDL***.  Insurance did not cover Lovaza or Vascepa Continue rosuvastatin, omega-3 fatty acids Heart healthy low-sodium high-fiber diet Increase physical activity as tolerated  Type 2 diabetes-reports carb modified diet. Follows with PCP  Disposition: Follow-up with Dr. Claiborne Billings, Almyra Deforest, PA-C, or me in 4-6 months.  {Are you ordering a CV Procedure (e.g. stress test, cath, DCCV, TEE, etc)?   Press F2        :[id number]}    Medication Adjustments/Labs and Tests Ordered: Current medicines are reviewed at length with the patient today.  Concerns regarding medicines are outlined above.  No orders of the defined types were placed in  this encounter.  No orders of the defined types were placed in this encounter.   There are no Patient Instructions on file for this visit.   Signed, [name], NP  11/06/2021 2:10 PM      Notice: This dictation was prepared with Dragon dictation along with smaller phrase technology. Any transcriptional errors that result from this process are unintentional and may not be corrected upon review.  I spent***minutes examining this patient, reviewing medications, and using patient centered shared decision making involving her cardiac care.  Prior to her visit I spent greater than 20 minutes reviewing her past medical history,  medications, and prior cardiac tests.

## 2021-11-08 ENCOUNTER — Ambulatory Visit (HOSPITAL_BASED_OUTPATIENT_CLINIC_OR_DEPARTMENT_OTHER): Payer: Medicare Other | Admitting: General Practice

## 2021-11-10 DIAGNOSIS — I10 Essential (primary) hypertension: Secondary | ICD-10-CM | POA: Diagnosis not present

## 2021-11-10 DIAGNOSIS — F419 Anxiety disorder, unspecified: Secondary | ICD-10-CM | POA: Diagnosis not present

## 2021-11-10 DIAGNOSIS — R197 Diarrhea, unspecified: Secondary | ICD-10-CM | POA: Diagnosis not present

## 2021-11-15 ENCOUNTER — Ambulatory Visit: Payer: Medicare Other | Admitting: Physical Therapy

## 2021-11-17 ENCOUNTER — Ambulatory Visit (INDEPENDENT_AMBULATORY_CARE_PROVIDER_SITE_OTHER): Payer: Medicare Other | Admitting: Cardiology

## 2021-11-17 ENCOUNTER — Telehealth: Payer: Self-pay

## 2021-11-17 DIAGNOSIS — Z7901 Long term (current) use of anticoagulants: Secondary | ICD-10-CM

## 2021-11-17 LAB — POCT INR: INR: 2.6 (ref 2.0–3.0)

## 2021-11-17 NOTE — Telephone Encounter (Signed)
Lpm to check INR 

## 2021-11-22 DIAGNOSIS — N644 Mastodynia: Secondary | ICD-10-CM | POA: Diagnosis not present

## 2021-11-30 ENCOUNTER — Ambulatory Visit (INDEPENDENT_AMBULATORY_CARE_PROVIDER_SITE_OTHER): Payer: Medicare Other | Admitting: Cardiovascular Disease

## 2021-11-30 DIAGNOSIS — Z7901 Long term (current) use of anticoagulants: Secondary | ICD-10-CM | POA: Diagnosis not present

## 2021-11-30 LAB — POCT INR: INR: 2.2 (ref 2.0–3.0)

## 2021-12-06 ENCOUNTER — Encounter (HOSPITAL_BASED_OUTPATIENT_CLINIC_OR_DEPARTMENT_OTHER): Payer: Self-pay | Admitting: Family

## 2021-12-06 ENCOUNTER — Ambulatory Visit (INDEPENDENT_AMBULATORY_CARE_PROVIDER_SITE_OTHER): Payer: Medicare Other | Admitting: Family

## 2021-12-06 ENCOUNTER — Other Ambulatory Visit: Payer: Self-pay

## 2021-12-06 VITALS — BP 130/72 | HR 79 | Ht 63.0 in | Wt 241.2 lb

## 2021-12-06 DIAGNOSIS — R5383 Other fatigue: Secondary | ICD-10-CM

## 2021-12-06 DIAGNOSIS — I480 Paroxysmal atrial fibrillation: Secondary | ICD-10-CM

## 2021-12-06 DIAGNOSIS — G4733 Obstructive sleep apnea (adult) (pediatric): Secondary | ICD-10-CM | POA: Diagnosis not present

## 2021-12-06 DIAGNOSIS — D6859 Other primary thrombophilia: Secondary | ICD-10-CM | POA: Diagnosis not present

## 2021-12-06 DIAGNOSIS — R531 Weakness: Secondary | ICD-10-CM

## 2021-12-06 NOTE — Progress Notes (Signed)
Office Visit    Patient Name: Debra Wright Date of Encounter: 12/06/2021  PCP:  [name], [place]  Cardiologist:  Shelva Majestic, MD  Advanced Practice Provider:  No care team member to display Electrophysiologist:  None      Chief Complaint    Debra Wright is a 71 y.o. female with a hx of HTN, DM2, OSA, HLD, PAF, anxiety presents today for weakness   Past Medical History    Past Medical History:  Diagnosis Date   Anxiety    Arthritis    "knees; right thumb" (10/14/2014)   Basal cell carcinoma    "burned off upper lip & right shoulder"   Chronic anticoagulation    CHADS VASC=3   Complication of anesthesia    after spinal anesthesia for left knee replacement, bladder did "not wake up". Was incontinent for 4 days post surgery   Depression    Fibroid tumor    Frequent diarrhea    GERD (gastroesophageal reflux disease)    High cholesterol    Hypertension    EF 65-70% grade 2DD   Insomnia    Migraine    "frequently when I was younger; maybe monthly now" (10/14/2014) - none after hysterectomy   PAF (paroxysmal atrial fibrillation) ([place]) 05/02/2018   converted with rate control   Pernicious anemia    pt not aware of this   Sleep apnea    uses a cpap   Type II diabetes mellitus ([place])    Vitamin B 12 deficiency    Past Surgical History:  Procedure Laterality Date   ABDOMINAL HYSTERECTOMY  1988   ACHILLES TENDON SURGERY Right    APPENDECTOMY  1988   BILATERAL OOPHORECTOMY  2004   CARPAL TUNNEL RELEASE Bilateral 1986   CATARACT EXTRACTION W/ INTRAOCULAR LENS  IMPLANT, BILATERAL Bilateral 2011   COLONOSCOPY     JOINT REPLACEMENT     SHOULDER ARTHROSCOPY DISTAL CLAVICLE EXCISION AND OPEN ROTATOR CUFF REPAIR Right 11/2013   TOENAIL EXCISION Right 04/2018   "big toe"   TONSILLECTOMY  1950's   TOTAL KNEE ARTHROPLASTY Left 10/13/2014   Procedure: LEFT TOTAL KNEE ARTHROPLASTY;  Surgeon: Mcarthur Rossetti, MD;  Location: [facility];  Service: Orthopedics;  Laterality: Left;   TOTAL KNEE ARTHROPLASTY Right 12/10/2018   Procedure: RIGHT TOTAL KNEE ARTHROPLASTY;  Surgeon: Mcarthur Rossetti, MD;  Location: [facility];  Service: Orthopedics;  Laterality: Right;    Allergies  Allergies  Allergen Reactions   Antivert [Meclizine Hcl] Other (See Comments)    "makes me feel like my skin is falling off".  03/05/20 pt states she was placed on this a few months back and did not have a reaction   Meclizine Other (See Comments)    "makes me feel like my skin is falling off".  03/05/20 pt states she was placed on this a few months back and did not have a reaction Other reaction(s): jumping out of skin, jittery   Zolpidem Other (See Comments)    Reverse affect   Zolpidem Tartrate     Other reaction(s): doing things in sleep unaware    History of Present Illness    Debra Wright is a 71 y.o. female with a hx of HTN, DM2, OSA, HLD, PAF, anxiety last seen 06/2021.  August 2019 she had a steroid injection and upon returning home had palpitations.  EMS tracing recorded atrial fibrillation with RVR.  Echo at that time with  normal LVEF, grade 2 diastolic dysfunction, aortic valve calcification without stenosis, mild to moderate LAE.  She has been maintained on anticoagulation with warfarin.  Myoview August 2019 negative for ischemia.  Metoprolol has been previously reduced due to bradycardia.  She was last seen 06/2021 by Almyra Deforest, PA doing overall well from a cardiac perspective and requesting clearance for back injection which was provided.  Due to elevated triglyceride she was started on Vascepa.  ED visit 09/29/21 for mechanical fall at which time she struck her head. Her CT and MRI of head were unremarkable aside from possible parotid mass. ENT following with plan to repeat scan in 6 months.   Presents today for follow up after [place] message reporting. Weakness, pressure between shoulder blades, irregular heart beat.  Palpitations have since resolved. Uses additional Metoprolol PRN as previously recommended. She endorses generalized weakness. Feels unsteady on her feet and as if she might stumble. Has started using a cane. She is scheduled to start physical therapy for balance training. She denies lightheadedness or dizziness. She eats late breakfast and an early dinner. Endorses staying well hydrated. Monitors her blood sugar at home with no hypoglycemic . Was having sharp pain between her should blades at rest not associated with dyspnea which has not recurred. She is primary concerned with her generalized weakness and wonders whether this is "just her age" or something wrong.   EKGs/Labs/Other Studies Reviewed:   The following studies were reviewed today:  CT head wo contrast 09/2021 IMPRESSION: 1. No acute intracranial findings are acute cervical spine findings. 2. Small mass or mildly enlarged lymph node in the right parotid gland, 0.7 cm in short axis. Significance uncertain, consider otolaryngology referral. 3.  Aortic Atherosclerosis (ICD10-I70.0).  MRI Brain 10/31/21 IMPRESSION: Mildly motion degraded exam.   No evidence of acute intracranial abnormality.   Mild chronic small vessel ischemic changes within the cerebral white matter and pons, new from the brain MRI of 11/22/2012.   Tiny chronic cortical infarct within the right frontal operculum, also new from the prior MRI.   Minimal generalized cerebral and cerebellar atrophy, not unexpected for age.   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 ENT consultation recommended.  EKG:  EKG is  ordered today.  The ekg ordered today demonstrates NSR 79 bpm with left axis deviation, LVH, no acute ST/T wave changes.   Recent Labs: 09/29/2021: BUN 7; Creatinine, Ser 0.70; Hemoglobin 12.6; Platelets 313; Potassium 4.0; Sodium 137  Recent Lipid Panel    Component Value  Date/Time   CHOL 138 08/07/2018 1530   TRIG 185 (H) 08/07/2018 1530   HDL 46 (L) 08/07/2018 1530   CHOLHDL 3.0 08/07/2018 1530   VLDL 22 05/03/2018 0153   LDLCALC 66 08/07/2018 1530   LDLDIRECT 42.0 01/31/2018 0943    Risk Assessment/Calculations:   CHA2DS2-VASc Score = 4   This indicates a 4.8% annual risk of stroke. The patient's score is based upon: CHF History: 0 HTN History: 1 Diabetes History: 1 Stroke History: 0 Vascular Disease History: 0 Age Score: 1 Gender Score: 1   Home Medications   Current Meds  Medication Sig   Blood Glucose Monitoring Suppl (ACCU-CHEK GUIDE ME) w/Device KIT USE AS DIRECTED.  Dx E11.9.   calcium carbonate (TUMS - DOSED IN MG ELEMENTAL CALCIUM) 500 MG chewable tablet Chew 2 tablets by mouth daily as needed for indigestion or heartburn.   Calcium Carbonate-Vitamin D (CALCIUM 600 + D PO)  Take 1 tablet by mouth daily.   cetirizine (ZYRTEC) 10 MG tablet Take 10 mg by mouth daily as needed for allergies.   CINNAMON PO Take 2 tablets by mouth daily.   citalopram (CELEXA) 40 MG tablet Take 1 tablet (40 mg total) by mouth daily.   clonazePAM (KLONOPIN) 0.5 MG tablet TAKE 1/2 TO 1 (ONE-HALF TO ONE) TABLET BY MOUTH AT BEDTIME AS NEEDED (Patient taking differently: Take 0.25-0.5 mg by mouth daily as needed for anxiety.)   Cyanocobalamin (VITAMIN B12) 1000 MCG TBCR Take 1,000 mcg by mouth daily.   diltiazem (CARTIA XT) 180 MG 24 hr capsule Take 1 capsule (180 mg total) by mouth daily.   Eszopiclone 3 MG TABS Take 1 tablet (3 mg total) by mouth at bedtime as needed. Take immediately before bedtime (Patient taking differently: Take 3 mg by mouth at bedtime as needed (sleep). Take immediately before bedtime)   Ferrous Sulfate (IRON) 325 (65 Fe) MG TABS Take 325 mg by mouth 2 (two) times daily.   fluconazole (DIFLUCAN) 150 MG tablet Take 1 tablet (150 mg total) by mouth every three (3) days as needed.   glucose blood (ACCU-CHEK GUIDE) test strip CHECK BLOOD  GLUCOSE IN THE MORNING AND AT BEDTIME.  E11.9 code.   magnesium oxide (MAG-OX) 400 (241.3 Mg) MG tablet Take 400 mg by mouth daily.   metFORMIN (GLUCOPHAGE) 1000 MG tablet Take 1 tablet (1,000 mg total) by mouth 2 (two) times daily with a meal.   metoprolol tartrate (LOPRESSOR) 25 MG tablet TAKE 1/2 TABLET BY MOUTH TWICE A DAY (Patient taking differently: Take 12.5 mg by mouth 2 (two) times daily.)   Omega-3 Fatty Acids (FISH OIL) 1200 MG CAPS Take 1,200 mg by mouth 2 (two) times daily.   omeprazole (PRILOSEC) 40 MG capsule Take 1 capsule by mouth once daily   repaglinide (PRANDIN) 2 MG tablet Take 1 tablet (2 mg total) by mouth 2 (two) times daily before a meal.   spironolactone (ALDACTONE) 25 MG tablet Take 1/2 (one-half) tablet by mouth once daily   tiZANidine (ZANAFLEX) 2 MG tablet Take 1-2 tablets (2-4 mg total) by mouth every 6 (six) hours as needed for muscle spasms.   vitamin C (ASCORBIC ACID) 250 MG tablet Take 250 mg by mouth daily.   warfarin (COUMADIN) 5 MG tablet TAKE 1/2 TO 1 TABLET BY MOUTH EVERY DAY AS DIRECTED BY CLINIC     Review of Systems     All other systems reviewed and are otherwise negative except as noted above.  Physical Exam    VS:  BP 130/72    Pulse 79    Ht _0  (1.6 m)    Wt 241 lb 3.2 oz (109.4 kg)    SpO2 96%    BMI 42.73 kg/m  , BMI Body mass index is 42.73 kg/m.  Wt Readings from Last 3 Encounters:  12/06/21 241 lb 3.2 oz (109.4 kg)  10/05/21 238 lb (108 kg)  09/29/21 243 lb (110.2 kg)     GEN: Well nourished, overweight, well developed, in no acute distress. HEENT: normal. Neck: Supple, no JVD, carotid bruits, or masses. Cardiac: RRR, no murmurs, rubs, or gallops. No clubbing, cyanosis, edema.  Radials/PT 2+ and equal bilaterally.  Respiratory:  Respirations regular and unlabored, clear to auscultation bilaterally. GI: Soft, nontender, nondistended. MS: No deformity or atrophy. Skin: Warm and dry, no rash. Neuro:  Strength and sensation are  intact. Psych: Normal affect.  Assessment & Plan    Weakness -  Anticipate deconditioning contributory. Encouraged her to participate in upcoming ortho PT. To rule out worsening cardiac function, update echocardiogram. If unremarkable, follow up with PCP.   HTN - BP well controlled. Continue current antihypertensive regimen.    Back pain - no radiation to chest. Reassurance provided that this is atypical for angina as occurred at rest. No indication for ischemic evaluation.   HLD - 04/2021 LDL 70. Continue Rosuvastatin. Denies myalgias.   DM2 - Continue to follow with PCP.   Obesity - Weight loss via diet and exercise encouraged. Discussed the impact being overweight would have on cardiovascular risk.   PAF / Chronic anticoagulation - CHA2DS2-VASc Score = 4 [CHF History: 0, HTN History: 1, Diabetes History: 1, Stroke History: 0, Vascular Disease History: 0, Age Score: 1, Gender Score: 1].  Therefore, the patient's annual risk of stroke is 4.8 %.    Continue anticoagulation with warfarin.  Follows closely with Coumadin clinic.  Denies bleeding complications.  Diatolic dysfunction - Euvolemic and well compensated on exam. Continue Metoprolol, Spironolactone. No indication for loop diuretic at this time. Low sodium diet, fluid restriction <2L, and daily weights encouraged. Educated to contact our office for weight gain of 2 lbs overnight or 5 lbs in one week.   OSA - CPAP compliance encouraged.    Disposition: Follow up in 3 month(s) with Shelva Majestic, MD or APP.  Signed, Loel Dubonnet, NP 12/06/2021, 4:00 PM [place]

## 2021-12-06 NOTE — Patient Instructions (Signed)
Medication Instructions:  ?Continue your current medications.  ? ?*If you need a refill on your cardiac medications before your next appointment, please call your pharmacy* ? ? ?Lab Work: ?None ordered today.  ? ?Testing/Procedures: ?Your physician has requested that you have an echocardiogram. Echocardiography is a painless test that uses sound waves to create images of your heart. It provides your doctor with information about the size and shape of your heart and how well your heart?s chambers and valves are working. This procedure takes approximately one hour. There are no restrictions for this procedure.  ? ? ?Follow-Up: ?At [facility], you and your health needs are our priority.  As part of our continuing mission to provide you with exceptional heart care, we have created designated Provider Care Teams.  These Care Teams include your primary Cardiologist (physician) and Advanced Practice Providers (APPs -  Physician Assistants and Nurse Practitioners) who all work together to provide you with the care you need, when you need it. ? ?We recommend signing up for the patient portal called "MyChart".  Sign up information is provided on this After Visit Summary.  MyChart is used to connect with patients for Virtual Visits (Telemedicine).  Patients are able to view lab/test results, encounter notes, upcoming appointments, etc.  Non-urgent messages can be sent to your provider as well.   ?To learn more about what you can do with MyChart, go to [email or web address].   ? ?Your next appointment:   ?In June as scheduled with Dr. Claiborne Billings ? ? ?Other Instructions ? ?Heart Healthy Diet Recommendations: ?A low-salt diet is recommended. Meats should be grilled, baked, or boiled. Avoid fried foods. Focus on lean protein sources like fish or chicken with vegetables and fruits. The American Heart Association is a Microbiologist!  American Heart Association Diet and Lifeystyle Recommendations   ? ?Exercise recommendations: ?The  American Heart Association recommends 150 minutes of moderate intensity exercise weekly. ?Try 30 minutes of moderate intensity exercise 4-5 times per week. ?This could include walking, jogging, or swimming. ? ?  ?

## 2021-12-07 ENCOUNTER — Encounter (HOSPITAL_BASED_OUTPATIENT_CLINIC_OR_DEPARTMENT_OTHER): Payer: Self-pay | Admitting: Family

## 2021-12-14 ENCOUNTER — Ambulatory Visit: Payer: Medicare Other | Attending: Otolaryngology | Admitting: Physical Therapy

## 2021-12-14 ENCOUNTER — Other Ambulatory Visit: Payer: Self-pay

## 2021-12-14 ENCOUNTER — Encounter: Payer: Self-pay | Admitting: Physical Therapy

## 2021-12-14 DIAGNOSIS — R262 Difficulty in walking, not elsewhere classified: Secondary | ICD-10-CM | POA: Diagnosis not present

## 2021-12-14 DIAGNOSIS — R2681 Unsteadiness on feet: Secondary | ICD-10-CM | POA: Insufficient documentation

## 2021-12-14 DIAGNOSIS — I480 Paroxysmal atrial fibrillation: Secondary | ICD-10-CM | POA: Diagnosis not present

## 2021-12-14 DIAGNOSIS — M6281 Muscle weakness (generalized): Secondary | ICD-10-CM | POA: Insufficient documentation

## 2021-12-14 LAB — POCT INR: INR: 2.6 (ref 2.0–3.0)

## 2021-12-14 NOTE — Therapy (Signed)
[place] [address] ?[place], [place], [address] ?Phone: [phone]   Fax:  [phone] ? ?Physical Therapy Evaluation ? ?Patient Details  ?Name: Debra Wright ?MRN: [id number] ?Date of Birth: Aug 16, 1951 ?Referring Provider (PT): Leta Baptist [facility] ? ? ?Encounter Date: 12/14/2021 ? ? PT End of Session - 12/14/21 1717   ? ? Visit Number 1   ? Date for PT Re-Evaluation 02/08/22   ? PT Start Time 1546   ? PT Stop Time 1628   ? PT Time Calculation (min) 42 min   ? Activity Tolerance Patient tolerated treatment well   ? Behavior During Therapy [facility] for tasks assessed/performed   ? ?  ?  ? ?  ? ? ?Past Medical History:  ?Diagnosis Date  ? Anxiety   ? Arthritis   ? "knees; right thumb" (10/14/2014)  ? Basal cell carcinoma   ? "burned off upper lip & right shoulder"  ? Chronic anticoagulation   ? CHADS VASC=3  ? Complication of anesthesia   ? after spinal anesthesia for left knee replacement, bladder did "not wake up". Was incontinent for 4 days post surgery  ? Depression   ? Fibroid tumor   ? Frequent diarrhea   ? GERD (gastroesophageal reflux disease)   ? High cholesterol   ? Hypertension   ? EF 65-70% grade 2DD  ? Insomnia   ? Migraine   ? "frequently when I was younger; maybe monthly now" (10/14/2014) - none after hysterectomy  ? PAF (paroxysmal atrial fibrillation) ([place]) 05/02/2018  ? converted with rate control  ? Pernicious anemia   ? pt not aware of this  ? Sleep apnea   ? uses a cpap  ? Type II diabetes mellitus ([place])   ? Vitamin B 12 deficiency   ? ? ?Past Surgical History:  ?Procedure Laterality Date  ? ABDOMINAL HYSTERECTOMY  1988  ? ACHILLES TENDON SURGERY Right   ? APPENDECTOMY  1988  ? BILATERAL OOPHORECTOMY  2004  ? CARPAL TUNNEL RELEASE Bilateral 1986  ? CATARACT EXTRACTION W/ INTRAOCULAR LENS  IMPLANT, BILATERAL Bilateral 2011  ? COLONOSCOPY    ? JOINT REPLACEMENT    ? SHOULDER ARTHROSCOPY DISTAL CLAVICLE EXCISION AND OPEN ROTATOR CUFF REPAIR Right 11/2013   ? TOENAIL EXCISION Right 04/2018  ? "big toe"  ? TONSILLECTOMY  1950's  ? TOTAL KNEE ARTHROPLASTY Left 10/13/2014  ? Procedure: LEFT TOTAL KNEE ARTHROPLASTY;  Surgeon: Mcarthur Rossetti, MD;  Location: [place];  Service: Orthopedics;  Laterality: Left;  ? TOTAL KNEE ARTHROPLASTY Right 12/10/2018  ? Procedure: RIGHT TOTAL KNEE ARTHROPLASTY;  Surgeon: Mcarthur Rossetti, MD;  Location: [place];  Service: Orthopedics;  Laterality: Right;  ? ? ?There were no vitals filed for this visit. ? ? ? Subjective Assessment - 12/14/21 1555   ? ? Subjective Patient reports new onset of balance difficulty, x about 1 year, after a fall at home when she stood to go to the bathroom, lost her balance backwards, hitting her head on the leg of a bedside table. Everything tested neg at that time. No dizziness. She now feels unsteady at imes while walking, has difficulty leaning forward, sometimes stumbles. she has chronic pain in L hip due to arthritis and also ashift of L5on S1 which happened with a fall many years. she gets injections in her L SI joint. Old R rot cuff tear, with limited ROM and strength.   ? Pertinent History hypertension, hyperlipidemia, anxiety, depression, OSA on  CPAP, panic disorder, insomnia, B12 deficiency, iron deficiency anemia, DM type II, and PAF   ? Limitations Standing;Walking;House hold activities   ? How long can you sit comfortably? N/A-Stiffness if sitting too long.   ? How long can you stand comfortably? about 15 minutes.   ? How long can you walk comfortably? Pain and unsteadiness after10 minutes.   ? Diagnostic tests MRI mild chronic small vessel ischemic changes in cerebral white matter and pons, tiny chronic cortical infarct in R frontal operculum, minimla generalized cerebral and cerebellar atrophy-apporpriate for age, R parotid gland lesion, highly suspicious for primary parotid neoplasm   ? Patient Stated Goals Be more stable during standing and gait, be able to help her husband more.   ?  Currently in Pain? No/denies   ? ?  ?  ? ?  ? ? ? ? ? [facility] PT Assessment - 12/14/21 0001   ? ?  ? Assessment  ? Medical Diagnosis balance deficits   ? Referring Provider (PT) Leta Baptist Jonathon Bellows   ?  ? Balance Screen  ? Has the patient fallen in the past 6 months No   ? Has the patient had a decrease in activity level because of a fear of falling?  Yes   ? Is the patient reluctant to leave their home because of a fear of falling?  Yes   ?  ? Home Environment  ? Living Environment Private residence   ? Living Arrangements Spouse/significant other   ? Available Help at Discharge Family   ? Type of Home Apartment   ? Home Layout One level   ? [place] - single point   ? Additional Comments Husband has shower seat.   ?  ? Prior Function  ? Level of Independence Independent   ? Vocation Retired   ? 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   ?  ? Cognition  ? Overall Cognitive Status Within Functional Limits for tasks assessed   ?  ? ROM / Strength  ? AROM / PROM / Strength AROM;Strength   ?  ? AROM  ? Overall AROM Comments Cervical ROM WFL, mildly diminished with L lateral flexion and rotation. R shoulder elevation limited to approx 90 degrewes due to rotator cuff injury. BLE WFL, R knee valgus noted.   ?  ? Strength  ? Overall Strength Comments B UE 3+-(4-)/5,   ? Strength Assessment Site Hip;Knee;Ankle   ? Right/Left Hip Right;Left   ? Right Hip Flexion 4/5   ? Right Hip Extension 4-/5   ? Right Hip ABduction 3+/5   ? Left Hip Flexion 4/5   ? Left Hip Extension 4-/5   ? Left Hip ABduction 3+/5   ? Right/Left Knee Right;Left   ? Right Knee Flexion 4+/5   ? Right Knee Extension 4+/5   ? Left Knee Flexion 4+/5   ? Left Knee Extension 4+/5   ? Right/Left Ankle Right;Left   ? Right Ankle Dorsiflexion 4+/5   ? Right Ankle Plantar Flexion 4-/5   ? Right Ankle Inversion 4/5   ? Right Ankle Eversion 4/5   ? Left Ankle Dorsiflexion 4/5   ? Left Ankle Plantar Flexion 4-/5   ? Left Ankle Inversion 4/5   ? Left Ankle  Eversion 4/5   ?  ? Ambulation/Gait  ? Gait Comments Ambulated 1 x 80' with cane and CGA, slow pace iwth increased BOS, increased lateral sway. Fatigued after gait.   ?  ?  Standardized Balance Assessment  ? Standardized Balance Assessment Berg Balance Test;Timed Up and Go Test;Five Times Sit to Stand   ? Five times sit to stand comments  24.35 used hands on knees, fatigued with last effort.   ?  ? Timed Up and Go Test  ? Normal TUG (seconds) 24.51   ? TUG Comments No cane, unsteady.   ?  ? Functional Gait  Assessment  ? Gait assessed  Yes   ? ?  ?  ? ?  ? ? ? ? ? ? ? ? ? ? ? ? ? ?Objective measurements completed on examination: See above findings.  ? ? ? ? ? ? ? ? ? ? ? ? ? ? PT Education - 12/14/21 1717   ? ? Education Details POC   ? Person(s) Educated Patient   ? Methods Explanation   ? Comprehension Verbalized understanding   ? ?  ?  ? ?  ? ? ? PT Short Term Goals - 12/14/21 1730   ? ?  ? PT SHORT TERM GOAL #1  ? Title Independent with initial HEP    ? Time 4   ? Period Weeks   ? Status New   ? Target Date 01/11/22   ? ?  ?  ? ?  ? ? ? ? PT Long Term Goals - 12/14/21 1731   ? ?  ? PT LONG TERM GOAL #1  ? Title Independent with advanced HEP   ? Time 8   ? Period Weeks   ? Status New   ? Target Date 02/08/22   ?  ? PT LONG TERM GOAL #2  ? Title Patient will complete 5 x STS in < 12 sec to demosntrate improved functional strength in BLE.   ? Baseline 25.35 sec   ? Time 8   ? Period Weeks   ? Status New   ? Target Date 02/08/22   ?  ? PT LONG TERM GOAL #3  ? Title Patient will complete TUG in < 13.5 sec to demosntrate decreased fall risk.   ? Baseline 24.51   ? Time 8   ? Period Weeks   ? Status New   ? Target Date 02/08/22   ?  ? PT LONG TERM GOAL #4  ? Title Patietn will score at least 46/56 in BERG to demosntrate decreased fall risk.   ? Baseline TBD   ? Time 8   ? Period Weeks   ? Status New   ? Target Date 02/08/22   ?  ? PT LONG TERM GOAL #5  ? Title Patient will score at least 25 on FGA to demonstrate  decreased fall risk in gait.   ? Baseline TBD   ? Time 8   ? Period Weeks   ? Status New   ? Target Date 01/18/22   ? ?  ?  ? ?  ? ? ? ? ? ? ? ? ? Plan - 12/14/21 1722   ? ? Clinical Impression Statement Patien

## 2021-12-15 ENCOUNTER — Ambulatory Visit (INDEPENDENT_AMBULATORY_CARE_PROVIDER_SITE_OTHER): Payer: Medicare Other | Admitting: Cardiology

## 2021-12-15 ENCOUNTER — Telehealth: Payer: Self-pay

## 2021-12-15 DIAGNOSIS — Z7901 Long term (current) use of anticoagulants: Secondary | ICD-10-CM

## 2021-12-15 NOTE — Telephone Encounter (Signed)
Lpm to check INR 

## 2021-12-20 ENCOUNTER — Ambulatory Visit: Payer: Medicare Other | Admitting: Physical Therapy

## 2021-12-21 DIAGNOSIS — N644 Mastodynia: Secondary | ICD-10-CM | POA: Diagnosis not present

## 2021-12-22 ENCOUNTER — Other Ambulatory Visit: Payer: Self-pay

## 2021-12-22 ENCOUNTER — Ambulatory Visit (INDEPENDENT_AMBULATORY_CARE_PROVIDER_SITE_OTHER): Payer: Medicare Other

## 2021-12-22 DIAGNOSIS — R262 Difficulty in walking, not elsewhere classified: Secondary | ICD-10-CM | POA: Diagnosis not present

## 2021-12-22 DIAGNOSIS — I480 Paroxysmal atrial fibrillation: Secondary | ICD-10-CM

## 2021-12-22 DIAGNOSIS — M6281 Muscle weakness (generalized): Secondary | ICD-10-CM | POA: Diagnosis not present

## 2021-12-22 DIAGNOSIS — R2681 Unsteadiness on feet: Secondary | ICD-10-CM | POA: Diagnosis not present

## 2021-12-22 MED ORDER — PERFLUTREN LIPID MICROSPHERE
1.0000 mL | INTRAVENOUS | Status: AC | PRN
  Administered 2021-12-22: 2 mL via INTRAVENOUS

## 2021-12-23 DIAGNOSIS — Z20822 Contact with and (suspected) exposure to covid-19: Secondary | ICD-10-CM | POA: Diagnosis not present

## 2021-12-23 LAB — ECHOCARDIOGRAM COMPLETE
AR max vel: 1.79 cm2
AV Area VTI: 2.05 cm2
AV Area mean vel: 1.62 cm2
AV Mean grad: 8 mmHg
AV Peak grad: 14.4 mmHg
Ao pk vel: 1.9 m/s
Area-P 1/2: 2.39 cm2
S' Lateral: 2.48 cm

## 2021-12-23 NOTE — Progress Notes (Signed)
Message left for patient to return call.  Georgana Curio RN ?

## 2021-12-26 ENCOUNTER — Ambulatory Visit: Payer: Medicare Other | Admitting: Endocrinology

## 2021-12-28 ENCOUNTER — Encounter: Payer: Self-pay | Admitting: Physical Therapy

## 2021-12-28 ENCOUNTER — Telehealth: Payer: Self-pay

## 2021-12-28 ENCOUNTER — Ambulatory Visit: Payer: Medicare Other | Admitting: Physical Therapy

## 2021-12-28 DIAGNOSIS — R2681 Unsteadiness on feet: Secondary | ICD-10-CM | POA: Diagnosis not present

## 2021-12-28 DIAGNOSIS — R262 Difficulty in walking, not elsewhere classified: Secondary | ICD-10-CM | POA: Diagnosis not present

## 2021-12-28 DIAGNOSIS — I480 Paroxysmal atrial fibrillation: Secondary | ICD-10-CM | POA: Diagnosis not present

## 2021-12-28 DIAGNOSIS — M6281 Muscle weakness (generalized): Secondary | ICD-10-CM

## 2021-12-28 NOTE — Therapy (Signed)
[place] [address] ?[place], [place], [address] ?Phone: [phone]   Fax:  [phone] ? ?Physical Therapy Treatment ? ?Patient Details  ?Name: Debra Wright ?MRN: [id number] ?Date of Birth: Jun 26, 1951 ?Referring Provider (PT): Leta Baptist [facility] ? ? ?Encounter Date: 12/28/2021 ? ? PT End of Session - 12/28/21 1443   ? ? Visit Number 2   ? Date for PT Re-Evaluation 02/08/22   ? Progress Note Due on Visit 10   ? PT Start Time 1402   ? PT Stop Time 1441   ? PT Time Calculation (min) 39 min   ? Activity Tolerance Patient tolerated treatment well   ? Behavior During Therapy [facility] for tasks assessed/performed   ? ?  ?  ? ?  ? ? ?Past Medical History:  ?Diagnosis Date  ? Anxiety   ? Arthritis   ? "knees; right thumb" (10/14/2014)  ? Basal cell carcinoma   ? "burned off upper lip & right shoulder"  ? Chronic anticoagulation   ? CHADS VASC=3  ? Complication of anesthesia   ? after spinal anesthesia for left knee replacement, bladder did "not wake up". Was incontinent for 4 days post surgery  ? Depression   ? Fibroid tumor   ? Frequent diarrhea   ? GERD (gastroesophageal reflux disease)   ? High cholesterol   ? Hypertension   ? EF 65-70% grade 2DD  ? Insomnia   ? Migraine   ? "frequently when I was younger; maybe monthly now" (10/14/2014) - none after hysterectomy  ? PAF (paroxysmal atrial fibrillation) ([place]) 05/02/2018  ? converted with rate control  ? Pernicious anemia   ? pt not aware of this  ? Sleep apnea   ? uses a cpap  ? Type II diabetes mellitus ([place])   ? Vitamin B 12 deficiency   ? ? ?Past Surgical History:  ?Procedure Laterality Date  ? ABDOMINAL HYSTERECTOMY  1988  ? ACHILLES TENDON SURGERY Right   ? APPENDECTOMY  1988  ? BILATERAL OOPHORECTOMY  2004  ? CARPAL TUNNEL RELEASE Bilateral 1986  ? CATARACT EXTRACTION W/ INTRAOCULAR LENS  IMPLANT, BILATERAL Bilateral 2011  ? COLONOSCOPY    ? JOINT REPLACEMENT    ? SHOULDER ARTHROSCOPY DISTAL CLAVICLE EXCISION AND OPEN  ROTATOR CUFF REPAIR Right 11/2013  ? TOENAIL EXCISION Right 04/2018  ? "big toe"  ? TONSILLECTOMY  1950's  ? TOTAL KNEE ARTHROPLASTY Left 10/13/2014  ? Procedure: LEFT TOTAL KNEE ARTHROPLASTY;  Surgeon: Mcarthur Rossetti, MD;  Location: [place];  Service: Orthopedics;  Laterality: Left;  ? TOTAL KNEE ARTHROPLASTY Right 12/10/2018  ? Procedure: RIGHT TOTAL KNEE ARTHROPLASTY;  Surgeon: Mcarthur Rossetti, MD;  Location: [place];  Service: Orthopedics;  Laterality: Right;  ? ? ?There were no vitals filed for this visit. ? ? Subjective Assessment - 12/28/21 1404   ? ? Subjective Doing good, have been able to do HEP every other day   ? Pertinent History hypertension, hyperlipidemia, anxiety, depression, OSA on CPAP, panic disorder, insomnia, B12 deficiency, iron deficiency anemia, DM type II, and PAF   ? Patient Stated Goals Be more stable during standing and gait, be able to help her husband more.   ? Currently in Pain? Yes   ? Pain Score 4    ? Pain Location Hip   ? Pain Orientation Left   ? Pain Descriptors / Indicators Sharp;Discomfort   ? Pain Type Chronic pain   ? ?  ?  ? ?  ? ? ? ? ?  [facility] PT Assessment - 12/28/21 0001   ? ?  ? Berg Balance Test  ? Sit to Stand Able to stand without using hands and stabilize independently   ? Standing Unsupported Able to stand safely 2 minutes   ? Sitting with Back Unsupported but Feet Supported on Floor or Stool Able to sit safely and securely 2 minutes   ? Stand to Sit Controls descent by using hands   ? Transfers Able to transfer safely, minor use of hands   ? Standing Unsupported with Eyes Closed Needs help to keep from falling   ? Standing Unsupported with Feet Together Able to place feet together independently but unable to hold for 30 seconds   ? From Standing, Reach Forward with Outstretched Arm Can reach confidently >25 cm (10")   ? From Standing Position, Pick up Object from Floor Able to pick up shoe, needs supervision   ? From Standing Position, Turn to Look Behind  Over each Shoulder Needs supervision when turning   ? Turn 360 Degrees Needs assistance while turning   ? Standing Unsupported, Alternately Place Feet on Step/Stool Able to complete 4 steps without aid or supervision   ? Standing Unsupported, One Foot in [employer] balance while stepping or standing   ? Standing on One Leg Unable to try or needs assist to prevent fall   ? Total Score 31   ? ?  ?  ? ?  ? ? ? ? ? ? ? ? ? ? ? ? ? ? ? ? [facility] PT Treatment/Exercise - 12/28/21 0001   ? ?  ? Exercises  ? Exercises Knee/Hip   ?  ? Knee/Hip Exercises: Aerobic  ? Nustep L3 x6 minutes BLEs only   ?  ? Knee/Hip Exercises: Standing  ? Heel Raises Both;1 set;10 reps   ? Heel Raises Limitations heel and toe   ? Forward Step Up 1 set;10 reps;Hand Hold: 2;Step Height: 4"   ? Other Standing Knee Exercises 3 way hip f/a/e 1x10 with 2# B   ?  ? Knee/Hip Exercises: Seated  ? Sit to Sand 1 set;10 reps;without UE support   ? ?  ?  ? ?  ? ? ? ? ? ? ? ? ? ? PT Education - 12/28/21 1443   ? ? Education Details implications of Berg score, recommendation for use of RW or 4WW until we get her stronger/better balanced   ? Person(s) Educated Patient   ? Methods Explanation   ? Comprehension Verbalized understanding   ? ?  ?  ? ?  ? ? ? PT Short Term Goals - 12/14/21 1730   ? ?  ? PT SHORT TERM GOAL #1  ? Title Independent with initial HEP    ? Time 4   ? Period Weeks   ? Status New   ? Target Date 01/11/22   ? ?  ?  ? ?  ? ? ? ? PT Long Term Goals - 12/14/21 1731   ? ?  ? PT LONG TERM GOAL #1  ? Title Independent with advanced HEP   ? Time 8   ? Period Weeks   ? Status New   ? Target Date 02/08/22   ?  ? PT LONG TERM GOAL #2  ? Title Patient will complete 5 x STS in < 12 sec to demosntrate improved functional strength in BLE.   ? Baseline 25.35 sec   ? Time 8   ? Period Weeks   ?  Status New   ? Target Date 02/08/22   ?  ? PT LONG TERM GOAL #3  ? Title Patient will complete TUG in < 13.5 sec to demosntrate decreased fall risk.   ? Baseline  24.51   ? Time 8   ? Period Weeks   ? Status New   ? Target Date 02/08/22   ?  ? PT LONG TERM GOAL #4  ? Title Patietn will score at least 46/56 in BERG to demosntrate decreased fall risk.   ? Baseline TBD   ? Time 8   ? Period Weeks   ? Status New   ? Target Date 02/08/22   ?  ? PT LONG TERM GOAL #5  ? Title Patient will score at least 25 on FGA to demonstrate decreased fall risk in gait.   ? Baseline TBD   ? Time 8   ? Period Weeks   ? Status New   ? Target Date 01/18/22   ? ?  ?  ? ?  ? ? ? ? ? ? ? ? Plan - 12/28/21 1444   ? ? Clinical Impression Statement Diane arrives doing OK today, we did the Berg balance test and she was only able to score 31, discussed implications of this score in relation to fall risk and recommended temporary use of RW or 4WW until we can get her more balanced and stronger. Otherwise we worked on functional activity tolerance, balance and strength training as time allowed. Will continue efforts.   ? Personal Factors and Comorbidities Age;Comorbidity 1;Profession   ? Comorbidities R parotid gland lesion, highly suspicious for primary parotid neoplasm   ? Examination-Activity Limitations Bathing;Locomotion Level;Transfers;Reach Overhead;Bend;Caring for Others;Lift;Squat   ? Examination-Participation Restrictions Meal Prep;Cleaning;Community Activity;Driving   ? Stability/Clinical Decision Making Evolving/Moderate complexity   ? Clinical Decision Making Moderate   ? Rehab Potential Good   ? PT Frequency 2x / week   ? PT Duration 8 weeks   ? PT Treatment/Interventions ADLs/Self Care Home Management;Iontophoresis '4mg'$ /ml Dexamethasone;Gait training;Stair training;Functional mobility training;Patient/family education;Therapeutic activities;Moist Heat;Ultrasound;Cryotherapy;Electrical Stimulation;Therapeutic exercise;Balance training;Neuromuscular re-education;Manual techniques;Dry needling;Passive range of motion   ? PT Next Visit Plan functional activity tolerance, balance, strength, HEP   ?  Consulted and Agree with Plan of Care Patient   ? ?  ?  ? ?  ? ? ?Patient will benefit from skilled therapeutic intervention in order to improve the following deficits and impairments:  Abnormal gait, Dec

## 2021-12-28 NOTE — Telephone Encounter (Signed)
Lpm to check INR 

## 2021-12-29 ENCOUNTER — Ambulatory Visit (INDEPENDENT_AMBULATORY_CARE_PROVIDER_SITE_OTHER): Payer: Medicare Other | Admitting: Cardiology

## 2021-12-29 DIAGNOSIS — Z7901 Long term (current) use of anticoagulants: Secondary | ICD-10-CM | POA: Diagnosis not present

## 2021-12-29 LAB — POCT INR: INR: 5 — AB (ref 2.0–3.0)

## 2022-01-03 ENCOUNTER — Encounter: Payer: Self-pay | Admitting: Physical Therapy

## 2022-01-03 ENCOUNTER — Ambulatory Visit: Payer: Medicare Other | Attending: Otolaryngology | Admitting: Physical Therapy

## 2022-01-03 DIAGNOSIS — M25511 Pain in right shoulder: Secondary | ICD-10-CM | POA: Insufficient documentation

## 2022-01-03 DIAGNOSIS — R262 Difficulty in walking, not elsewhere classified: Secondary | ICD-10-CM | POA: Insufficient documentation

## 2022-01-03 DIAGNOSIS — G8929 Other chronic pain: Secondary | ICD-10-CM | POA: Diagnosis not present

## 2022-01-03 DIAGNOSIS — R2681 Unsteadiness on feet: Secondary | ICD-10-CM | POA: Diagnosis not present

## 2022-01-03 DIAGNOSIS — M6281 Muscle weakness (generalized): Secondary | ICD-10-CM | POA: Insufficient documentation

## 2022-01-03 DIAGNOSIS — M25611 Stiffness of right shoulder, not elsewhere classified: Secondary | ICD-10-CM | POA: Insufficient documentation

## 2022-01-03 LAB — POCT INR: INR: 2.1 (ref 2.0–3.0)

## 2022-01-03 NOTE — Therapy (Signed)
[facility] ?[address] ?[place], [place], [address] ?Phone: [phone]   Fax:  [phone] ? ?Physical Therapy Treatment ? ?Patient Details  ?Name: Debra Wright ?MRN: [id number] ?Date of Birth: 1951/05/01 ?Referring Provider (PT): [facility] ? ? ?Encounter Date: 01/03/2022 ? ? PT End of Session - 01/03/22 1528   ? ? Visit Number 3   ? Date for PT Re-Evaluation 02/08/22   ? Progress Note Due on Visit 10   ? PT Start Time 1500   arrived late  ? PT Stop Time 1528   ? PT Time Calculation (min) 28 min   ? Activity Tolerance Patient tolerated treatment well   ? Behavior During Therapy [facility] for tasks assessed/performed   ? ?  ?  ? ?  ? ? ?Past Medical History:  ?Diagnosis Date  ? Anxiety   ? Arthritis   ? "knees; right thumb" (10/14/2014)  ? Basal cell carcinoma   ? "burned off upper lip & right shoulder"  ? Chronic anticoagulation   ? CHADS VASC=3  ? Complication of anesthesia   ? after spinal anesthesia for left knee replacement, bladder did "not wake up". Was incontinent for 4 days post surgery  ? Depression   ? Fibroid tumor   ? Frequent diarrhea   ? GERD (gastroesophageal reflux disease)   ? High cholesterol   ? Hypertension   ? EF 65-70% grade 2DD  ? Insomnia   ? Migraine   ? "frequently when I was younger; maybe monthly now" (10/14/2014) - none after hysterectomy  ? PAF (paroxysmal atrial fibrillation) ([place]) 05/02/2018  ? converted with rate control  ? Pernicious anemia   ? pt not aware of this  ? Sleep apnea   ? uses a cpap  ? Type II diabetes mellitus ([place])   ? Vitamin B 12 deficiency   ? ? ?Past Surgical History:  ?Procedure Laterality Date  ? ABDOMINAL HYSTERECTOMY  1988  ? ACHILLES TENDON SURGERY Right   ? APPENDECTOMY  1988  ? BILATERAL OOPHORECTOMY  2004  ? CARPAL TUNNEL RELEASE Bilateral 1986  ? CATARACT EXTRACTION W/ INTRAOCULAR LENS  IMPLANT, BILATERAL Bilateral 2011  ? COLONOSCOPY    ? JOINT REPLACEMENT    ? SHOULDER ARTHROSCOPY DISTAL CLAVICLE  EXCISION AND OPEN ROTATOR CUFF REPAIR Right 11/2013  ? TOENAIL EXCISION Right 04/2018  ? "big toe"  ? TONSILLECTOMY  1950's  ? TOTAL KNEE ARTHROPLASTY Left 10/13/2014  ? Procedure: LEFT TOTAL KNEE ARTHROPLASTY;  Surgeon: Mcarthur Rossetti, MD;  Location: [facility];  Service: Orthopedics;  Laterality: Left;  ? TOTAL KNEE ARTHROPLASTY Right 12/10/2018  ? Procedure: RIGHT TOTAL KNEE ARTHROPLASTY;  Surgeon: Mcarthur Rossetti, MD;  Location: [facility];  Service: Orthopedics;  Laterality: Right;  ? ? ?There were no vitals filed for this visit. ? ? Subjective Assessment - 01/03/22 1503   ? ? Subjective Sorry I'm late today, my husband almost fell and it took me a little longer to get him situated before I could leave to come here   ? Pertinent History hypertension, hyperlipidemia, anxiety, depression, OSA on CPAP, panic disorder, insomnia, B12 deficiency, iron deficiency anemia, DM type II, and PAF   ? Patient Stated Goals Be more stable during standing and gait, be able to help her husband more.   ? Currently in Pain? Yes   ? Pain Score 4    ? Pain Location Coccyx   ? Pain Orientation Medial   ? Pain  Descriptors / Indicators Sharp   ? Pain Type Acute pain   ? ?  ?  ? ?  ? ? ? ? ? ? ? ? ? ? ? ? ? ? ? ? ? ? ? ? [facility] Adult PT Treatment/Exercise - 01/03/22 0001   ? ?  ? Knee/Hip Exercises: Aerobic  ? Nustep L5 x6 minutes BLEs only   ?  ? Knee/Hip Exercises: Standing  ? Other Standing Knee Exercises side steps with wate bar chest presses 3 laps in front of table; standing marches x20 MinA;   ? Other Standing Knee Exercises farmers carry 6# x212f   ? ?  ?  ? ?  ? ? ? ? ? ? Balance Exercises - 01/03/22 0001   ? ?  ? Balance Exercises: Standing  ? Standing Eyes Opened Narrow base of support (BOS);Solid surface;3 reps;20 secs   ? Tandem Stance Eyes open;3 reps;30 secs   ? ?  ?  ? ?  ? ? ? ? ? PT Education - 01/03/22 1528   ? ? Education Details exercise form/purpose   ? Person(s) Educated Patient   ? Methods Explanation   ?  Comprehension Verbalized understanding   ? ?  ?  ? ?  ? ? ? PT Short Term Goals - 12/14/21 1730   ? ?  ? PT SHORT TERM GOAL #1  ? Title Independent with initial HEP    ? Time 4   ? Period Weeks   ? Status New   ? Target Date 01/11/22   ? ?  ?  ? ?  ? ? ? ? PT Long Term Goals - 12/14/21 1731   ? ?  ? PT LONG TERM GOAL #1  ? Title Independent with advanced HEP   ? Time 8   ? Period Weeks   ? Status New   ? Target Date 02/08/22   ?  ? PT LONG TERM GOAL #2  ? Title Patient will complete 5 x STS in < 12 sec to demosntrate improved functional strength in BLE.   ? Baseline 25.35 sec   ? Time 8   ? Period Weeks   ? Status New   ? Target Date 02/08/22   ?  ? PT LONG TERM GOAL #3  ? Title Patient will complete TUG in < 13.5 sec to demosntrate decreased fall risk.   ? Baseline 24.51   ? Time 8   ? Period Weeks   ? Status New   ? Target Date 02/08/22   ?  ? PT LONG TERM GOAL #4  ? Title Patietn will score at least 46/56 in BERG to demosntrate decreased fall risk.   ? Baseline TBD   ? Time 8   ? Period Weeks   ? Status New   ? Target Date 02/08/22   ?  ? PT LONG TERM GOAL #5  ? Title Patient will score at least 25 on FGA to demonstrate decreased fall risk in gait.   ? Baseline TBD   ? Time 8   ? Period Weeks   ? Status New   ? Target Date 01/18/22   ? ?  ?  ? ?  ? ? ? ? ? ? ? ? Plan - 01/03/22 1529   ? ? Clinical Impression Statement Diane arrives today doing OK, just running a little late today. Warmed up on the Nustep, then continued to work on functional activity tolerance, strength, and balance today. Still very easily  fatigued. Often had posterior loss of balance and needed MinA to correct/prevent a fall today.   ? Personal Factors and Comorbidities Age;Comorbidity 1;Profession   ? Comorbidities R parotid gland lesion, highly suspicious for primary parotid neoplasm   ? Examination-Activity Limitations Bathing;Locomotion Level;Transfers;Reach Overhead;Bend;Caring for Others;Lift;Squat   ? Examination-Participation  Restrictions Meal Prep;Cleaning;Community Activity;Driving   ? Stability/Clinical Decision Making Evolving/Moderate complexity   ? Clinical Decision Making Moderate   ? Rehab Potential Good   ? PT Frequency 2x / week   ? PT Duration 8 weeks   ? PT Treatment/Interventions ADLs/Self Care Home Management;Iontophoresis '4mg'$ /ml Dexamethasone;Gait training;Stair training;Functional mobility training;Patient/family education;Therapeutic activities;Moist Heat;Ultrasound;Cryotherapy;Electrical Stimulation;Therapeutic exercise;Balance training;Neuromuscular re-education;Manual techniques;Dry needling;Passive range of motion   ? PT Next Visit Plan functional activity tolerance, balance, strength, HEP   ? Consulted and Agree with Plan of Care Patient   ? ?  ?  ? ?  ? ? ?Patient will benefit from skilled therapeutic intervention in order to improve the following deficits and impairments:  Abnormal gait, Decreased coordination, Difficulty walking, Decreased endurance, Decreased activity tolerance, Pain, Postural dysfunction, Improper body mechanics, Impaired flexibility, Decreased strength, Decreased mobility, Decreased balance ? ?Visit Diagnosis: ?Muscle weakness (generalized) ? ?Unsteadiness on feet ? ?Difficulty in walking, not elsewhere classified ? ? ? ? ?Problem List ?Patient Active Problem List  ? Diagnosis Date Noted  ? Unilateral primary osteoarthritis, right knee 12/10/2018  ? Status post total right knee replacement 12/10/2018  ? Long term (current) use of anticoagulants 08/16/2018  ? Hypertensive cardiovascular disease 06/04/2018  ? Morbid obesity with BMI of 40.0-44.9, adult ([place]) 06/04/2018  ? Fatigue 06/04/2018  ? Chronic anticoagulation   ? PAF (paroxysmal atrial fibrillation) ([place]) 05/02/2018  ? Primary osteoarthritis of right knee 06/14/2017  ? OSA (obstructive sleep apnea) 02/13/2017  ? NIDDM-type 2 10/31/2016  ? Essential hypertension 08/02/2016  ? Anxiety and depression 08/02/2016  ? Insomnia 08/02/2016  ?  Arthritis of left knee 10/13/2014  ? Status post total left knee replacement 10/13/2014  ? ?Jezebelle Ledwell U PT, DPT, PN2  ? ?Supplemental Physical Therapist ?[place]  ? ? ? ? ? ?[place] ?[place]

## 2022-01-04 ENCOUNTER — Ambulatory Visit (INDEPENDENT_AMBULATORY_CARE_PROVIDER_SITE_OTHER): Payer: Medicare Other | Admitting: Cardiovascular Disease

## 2022-01-04 DIAGNOSIS — Z7901 Long term (current) use of anticoagulants: Secondary | ICD-10-CM

## 2022-01-05 ENCOUNTER — Ambulatory Visit: Payer: Medicare Other | Admitting: Physical Therapy

## 2022-01-06 ENCOUNTER — Other Ambulatory Visit: Payer: Self-pay | Admitting: Cardiovascular Disease

## 2022-01-06 DIAGNOSIS — I480 Paroxysmal atrial fibrillation: Secondary | ICD-10-CM

## 2022-01-10 ENCOUNTER — Encounter: Payer: Self-pay | Admitting: Physical Therapy

## 2022-01-10 ENCOUNTER — Ambulatory Visit: Payer: Medicare Other | Admitting: Physical Therapy

## 2022-01-10 DIAGNOSIS — M6281 Muscle weakness (generalized): Secondary | ICD-10-CM | POA: Diagnosis not present

## 2022-01-10 DIAGNOSIS — G8929 Other chronic pain: Secondary | ICD-10-CM

## 2022-01-10 DIAGNOSIS — M25611 Stiffness of right shoulder, not elsewhere classified: Secondary | ICD-10-CM | POA: Diagnosis not present

## 2022-01-10 DIAGNOSIS — R2681 Unsteadiness on feet: Secondary | ICD-10-CM

## 2022-01-10 DIAGNOSIS — R262 Difficulty in walking, not elsewhere classified: Secondary | ICD-10-CM

## 2022-01-10 DIAGNOSIS — M25511 Pain in right shoulder: Secondary | ICD-10-CM | POA: Diagnosis not present

## 2022-01-10 NOTE — Therapy (Signed)
[facility] ?[place] ?[address] ?[place], [place], [address] ?Phone: [phone]   Fax:  [phone] ? ?Physical Therapy Treatment ? ?Patient Details  ?Name: Debra Wright ?MRN: [id number] ?Date of Birth: Dec 05, 1950 ?Referring Provider (PT): Leta Baptist [facility] ? ? ?Encounter Date: 01/10/2022 ? ? PT End of Session - 01/10/22 1508   ? ? Visit Number 4   ? Date for PT Re-Evaluation 02/08/22   ? PT Start Time 1430   ? PT Stop Time 1500   ? PT Time Calculation (min) 30 min   ? Activity Tolerance Patient tolerated treatment well   ? Behavior During Therapy [facility] for tasks assessed/performed   ? ?  ?  ? ?  ? ? ?Past Medical History:  ?Diagnosis Date  ? Anxiety   ? Arthritis   ? "knees; right thumb" (10/14/2014)  ? Basal cell carcinoma   ? "burned off upper lip & right shoulder"  ? Chronic anticoagulation   ? CHADS VASC=3  ? Complication of anesthesia   ? after spinal anesthesia for left knee replacement, bladder did "not wake up". Was incontinent for 4 days post surgery  ? Depression   ? Fibroid tumor   ? Frequent diarrhea   ? GERD (gastroesophageal reflux disease)   ? High cholesterol   ? Hypertension   ? EF 65-70% grade 2DD  ? Insomnia   ? Migraine   ? "frequently when I was younger; maybe monthly now" (10/14/2014) - none after hysterectomy  ? PAF (paroxysmal atrial fibrillation) ([place]) 05/02/2018  ? converted with rate control  ? Pernicious anemia   ? pt not aware of this  ? Sleep apnea   ? uses a cpap  ? Type II diabetes mellitus ([place])   ? Vitamin B 12 deficiency   ? ? ?Past Surgical History:  ?Procedure Laterality Date  ? ABDOMINAL HYSTERECTOMY  1988  ? ACHILLES TENDON SURGERY Right   ? APPENDECTOMY  1988  ? BILATERAL OOPHORECTOMY  2004  ? CARPAL TUNNEL RELEASE Bilateral 1986  ? CATARACT EXTRACTION W/ INTRAOCULAR LENS  IMPLANT, BILATERAL Bilateral 2011  ? COLONOSCOPY    ? JOINT REPLACEMENT    ? SHOULDER ARTHROSCOPY DISTAL CLAVICLE EXCISION AND OPEN ROTATOR CUFF REPAIR Right 11/2013  ?  TOENAIL EXCISION Right 04/2018  ? "big toe"  ? TONSILLECTOMY  1950's  ? TOTAL KNEE ARTHROPLASTY Left 10/13/2014  ? Procedure: LEFT TOTAL KNEE ARTHROPLASTY;  Surgeon: Mcarthur Rossetti, MD;  Location: [facility];  Service: Orthopedics;  Laterality: Left;  ? TOTAL KNEE ARTHROPLASTY Right 12/10/2018  ? Procedure: RIGHT TOTAL KNEE ARTHROPLASTY;  Surgeon: Mcarthur Rossetti, MD;  Location: [facility];  Service: Orthopedics;  Laterality: Right;  ? ? ?There were no vitals filed for this visit. ? ? Subjective Assessment - 01/10/22 1434   ? ? Subjective "Ok" Having a hard time meeting the schedule with other life obligations.   ? Currently in Pain? Yes   ? Pain Score 5    ? Pain Location Back   ? Pain Orientation Left   ? ?  ?  ? ?  ? ? ? ? ? ? ? ? ? ? ? ? ? ? ? ? ? ? ? ? [facility] Adult PT Treatment/Exercise - 01/10/22 0001   ? ?  ? High Level Balance  ? High Level Balance Activities Side stepping   ?  ? Knee/Hip Exercises: Aerobic  ? Nustep L4 x6 minutes BLEs only   ?  ?  Knee/Hip Exercises: Standing  ? Hip ADduction Strengthening;Both;1 set;10 reps   ? Hip Extension Stengthening;Both;1 set;10 reps;Knee straight   ? Other Standing Knee Exercises Standing marches   ?  ? Knee/Hip Exercises: Seated  ? Sit to Sand 2 sets;without UE support;5 reps   ? ?  ?  ? ?  ? ? ? ? ? ? ? ? ? ? ? ? PT Short Term Goals - 12/14/21 1730   ? ?  ? PT SHORT TERM GOAL #1  ? Title Independent with initial HEP    ? Time 4   ? Period Weeks   ? Status New   ? Target Date 01/11/22   ? ?  ?  ? ?  ? ? ? ? PT Long Term Goals - 12/14/21 1731   ? ?  ? PT LONG TERM GOAL #1  ? Title Independent with advanced HEP   ? Time 8   ? Period Weeks   ? Status New   ? Target Date 02/08/22   ?  ? PT LONG TERM GOAL #2  ? Title Patient will complete 5 x STS in < 12 sec to demosntrate improved functional strength in BLE.   ? Baseline 25.35 sec   ? Time 8   ? Period Weeks   ? Status New   ? Target Date 02/08/22   ?  ? PT LONG TERM GOAL #3  ? Title Patient will complete TUG in <  13.5 sec to demosntrate decreased fall risk.   ? Baseline 24.51   ? Time 8   ? Period Weeks   ? Status New   ? Target Date 02/08/22   ?  ? PT LONG TERM GOAL #4  ? Title Patietn will score at least 46/56 in BERG to demosntrate decreased fall risk.   ? Baseline TBD   ? Time 8   ? Period Weeks   ? Status New   ? Target Date 02/08/22   ?  ? PT LONG TERM GOAL #5  ? Title Patient will score at least 25 on FGA to demonstrate decreased fall risk in gait.   ? Baseline TBD   ? Time 8   ? Period Weeks   ? Status New   ? Target Date 01/18/22   ? ?  ?  ? ?  ? ? ? ? ? ? ? ? Plan - 01/10/22 1508   ? ? Clinical Impression Statement Pt enters clinic reporting that she is feeling fine but today had to be her last day. Pt reports a hectic life schedule being the reason for her discharge. Today session consisted of going over all interventions on new HEP for compliance. Pt completed all interventions on HEP and verbalized understanding   ? Personal Factors and Comorbidities Age;Comorbidity 1;Profession   ? Comorbidities R parotid gland lesion, highly suspicious for primary parotid neoplasm   ? Examination-Activity Limitations Bathing;Locomotion Level;Transfers;Reach Overhead;Bend;Caring for Others;Lift;Squat   ? Examination-Participation Restrictions Meal Prep;Cleaning;Community Activity;Driving   ? PT Treatment/Interventions ADLs/Self Care Home Management;Iontophoresis 39m/ml Dexamethasone;Gait training;Stair training;Functional mobility training;Patient/family education;Therapeutic activities;Moist Heat;Ultrasound;Cryotherapy;Electrical Stimulation;Therapeutic exercise;Balance training;Neuromuscular re-education;Manual techniques;Dry needling;Passive range of motion   ? PT Next Visit Plan D/C   ? PT Home Exercise Plan Access Code: [email or web address]  URL: [email or web address]  Date: 01/10/2022  Prepared by: RCheri Fowler   Exercises  - Sit to Stand with Hands on Knees  - 1 x daily - 7 x weekly - 3 sets - 10 reps  - Standing  March with Counter Support  - 1 x daily - 7 x weekly - 3 sets - 10 reps  - Standing Hip Extension with Unilateral Counter Support  - 1 x daily - 7 x weekly - 3 sets - 10 reps  - Standing Hip Abduction with Unilateral Counter Support  - 1 x daily - 7 x weekly - 3 sets - 10 reps  - Side Stepping with Unilateral Counter Support  - 1 x daily - 7 x weekly - 3 sets - 10 reps   ? ?  ?  ? ?  ? ? ?Patient will benefit from skilled therapeutic intervention in order to improve the following deficits and impairments:  Abnormal gait, Decreased coordination, Difficulty walking, Decreased endurance, Decreased activity tolerance, Pain, Postural dysfunction, Improper body mechanics, Impaired flexibility, Decreased strength, Decreased mobility, Decreased balance ? ?Visit Diagnosis: ?Muscle weakness (generalized) ? ?Unsteadiness on feet ? ?Difficulty in walking, not elsewhere classified ? ?Chronic right shoulder pain ? ?Stiffness of right shoulder, not elsewhere classified ? ? ? ? ?Problem List ?Patient Active Problem List  ? Diagnosis Date Noted  ? Unilateral primary osteoarthritis, right knee 12/10/2018  ? Status post total right knee replacement 12/10/2018  ? Long term (current) use of anticoagulants 08/16/2018  ? Hypertensive cardiovascular disease 06/04/2018  ? Morbid obesity with BMI of 40.0-44.9, adult ([place]) 06/04/2018  ? Fatigue 06/04/2018  ? Chronic anticoagulation   ? PAF (paroxysmal atrial fibrillation) ([place]) 05/02/2018  ? Primary osteoarthritis of right knee 06/14/2017  ? OSA (obstructive sleep apnea) 02/13/2017  ? NIDDM-type 2 10/31/2016  ? Essential hypertension 08/02/2016  ? Anxiety and depression 08/02/2016  ? Insomnia 08/02/2016  ? Arthritis of left knee 10/13/2014  ? Status post total left knee replacement 10/13/2014  ? ?PHYSICAL THERAPY DISCHARGE SUMMARY ? ?Visits from Start of Care: 4 ? ?Patient agrees to discharge. Patient goals were not met. Patient is being discharged due to the patient's request. ? ? ?Scot Jun, PTA ?01/10/2022, 3:12 PM ? ?[facility] ?[place] ?[address] ?[place], [place], [address] ?Phone: [phone]   Fax:  [phone] ? ?Name: Debra Wright

## 2022-01-11 ENCOUNTER — Other Ambulatory Visit: Payer: Self-pay | Admitting: Cardiovascular Disease

## 2022-01-11 DIAGNOSIS — I480 Paroxysmal atrial fibrillation: Secondary | ICD-10-CM

## 2022-01-12 ENCOUNTER — Ambulatory Visit: Payer: Medicare Other | Admitting: Physical Therapy

## 2022-01-17 ENCOUNTER — Encounter: Payer: Self-pay | Admitting: Cardiovascular Disease

## 2022-01-18 DIAGNOSIS — Z20828 Contact with and (suspected) exposure to other viral communicable diseases: Secondary | ICD-10-CM | POA: Diagnosis not present

## 2022-01-19 ENCOUNTER — Encounter: Payer: Self-pay | Admitting: Cardiovascular Disease

## 2022-01-19 ENCOUNTER — Other Ambulatory Visit: Payer: Self-pay | Admitting: Cardiovascular Disease

## 2022-01-20 ENCOUNTER — Telehealth: Payer: Self-pay

## 2022-01-20 NOTE — Telephone Encounter (Signed)
Lpm to check INR 

## 2022-01-23 ENCOUNTER — Telehealth: Payer: Self-pay

## 2022-01-23 NOTE — Telephone Encounter (Signed)
Reminded pt to check INR. Verbalized understanding °

## 2022-01-24 DIAGNOSIS — Z20822 Contact with and (suspected) exposure to covid-19: Secondary | ICD-10-CM | POA: Diagnosis not present

## 2022-01-26 ENCOUNTER — Telehealth: Payer: Self-pay

## 2022-01-26 NOTE — Telephone Encounter (Signed)
Lmom for missed coumadin appt  ?

## 2022-01-27 ENCOUNTER — Telehealth: Payer: Self-pay

## 2022-01-27 NOTE — Telephone Encounter (Signed)
Received message that patient is holding Coumadin prior to 5/2 Injection.   ? ?I left message to have her check INR 5/5 (self tester, if has strips) otherwise schedule appointment in office. ?

## 2022-01-27 NOTE — Telephone Encounter (Signed)
Spoke with patient and discussed INR check scheduled next Friday 5/5.  She verbalized understanding ?

## 2022-01-31 ENCOUNTER — Ambulatory Visit: Payer: Medicare Other | Admitting: General Practice

## 2022-01-31 DIAGNOSIS — M5416 Radiculopathy, lumbar region: Secondary | ICD-10-CM | POA: Diagnosis not present

## 2022-01-31 DIAGNOSIS — Z20822 Contact with and (suspected) exposure to covid-19: Secondary | ICD-10-CM | POA: Diagnosis not present

## 2022-01-31 NOTE — Progress Notes (Signed)
? ?Cardiology Clinic Note  ? ?Patient Name: Debra Wright ?Date of Encounter: 02/03/2022 ? ?Primary Care Provider:  [name], PA ?Primary Cardiologist:  Shelva Majestic, MD ? ?Patient Profile  ?  ?Debra Wright 71 year old female presents to the clinic today for follow-up evaluation of her essential hypertension and paroxysmal atrial fibrillation. ? ?Past Medical History  ?  ?Past Medical History:  ?Diagnosis Date  ? Anxiety   ? Arthritis   ? "knees; right thumb" (10/14/2014)  ? Basal cell carcinoma   ? "burned off upper lip & right shoulder"  ? Chronic anticoagulation   ? CHADS VASC=3  ? Complication of anesthesia   ? after spinal anesthesia for left knee replacement, bladder did "not wake up". Was incontinent for 4 days post surgery  ? Depression   ? Fibroid tumor   ? Frequent diarrhea   ? GERD (gastroesophageal reflux disease)   ? High cholesterol   ? Hypertension   ? EF 65-70% grade 2DD  ? Insomnia   ? Migraine   ? "frequently when I was younger; maybe monthly now" (10/14/2014) - none after hysterectomy  ? PAF (paroxysmal atrial fibrillation) ([place]) 05/02/2018  ? converted with rate control  ? Pernicious anemia   ? pt not aware of this  ? Sleep apnea   ? uses a cpap  ? Type II diabetes mellitus ([place])   ? Vitamin B 12 deficiency   ? ?Past Surgical History:  ?Procedure Laterality Date  ? ABDOMINAL HYSTERECTOMY  1988  ? ACHILLES TENDON SURGERY Right   ? APPENDECTOMY  1988  ? BILATERAL OOPHORECTOMY  2004  ? CARPAL TUNNEL RELEASE Bilateral 1986  ? CATARACT EXTRACTION W/ INTRAOCULAR LENS  IMPLANT, BILATERAL Bilateral 2011  ? COLONOSCOPY    ? JOINT REPLACEMENT    ? SHOULDER ARTHROSCOPY DISTAL CLAVICLE EXCISION AND OPEN ROTATOR CUFF REPAIR Right 11/2013  ? TOENAIL EXCISION Right 04/2018  ? "big toe"  ? TONSILLECTOMY  1950's  ? TOTAL KNEE ARTHROPLASTY Left 10/13/2014  ? Procedure: LEFT TOTAL KNEE ARTHROPLASTY;  Surgeon: Mcarthur Rossetti, MD;  Location: [facility];  Service: Orthopedics;  Laterality: Left;  ? TOTAL KNEE  ARTHROPLASTY Right 12/10/2018  ? Procedure: RIGHT TOTAL KNEE ARTHROPLASTY;  Surgeon: Mcarthur Rossetti, MD;  Location: [facility];  Service: Orthopedics;  Laterality: Right;  ? ? ?Allergies ? ?Allergies  ?Allergen Reactions  ? Antivert [Meclizine Hcl] Other (See Comments)  ?  "makes me feel like my skin is falling off". ? ?03/05/20 pt states she was placed on this a few months back and did not have a reaction  ? Meclizine Other (See Comments)  ?  "makes me feel like my skin is falling off". ? ?03/05/20 pt states she was placed on this a few months back and did not have a reaction ?Other reaction(s): jumping out of skin, jittery  ? Zolpidem Other (See Comments)  ?  Reverse affect  ? Zolpidem Tartrate   ?  Other reaction(s): doing things in sleep unaware  ? ? ?History of Present Illness  ?  ?Debra Wright has a PMH of essential hypertension, paroxysmal atrial fibrillation, hypertensive cardiovascular disease, OSA, type 2 diabetes, insomnia, on chronic anticoagulation, fatigue, and is status post right total knee replacement.  She had a steroid injection 8/19.  Upon returning home she noted palpitations.  EMS was called and her EKG tracing showed atrial fibrillation with RVR.  Echocardiogram showed normal LV function with grade 2 diastolic dysfunction, aortic valve calcification, mild-moderate  left atrial enlargement.  She was placed on warfarin at that time.  A stress test 8/19 was negative for ischemia.  Her metoprolol was reduced due to bradycardia.  She is a retired Therapist, sports ? ?She was seen in follow-up by Debra Deforest PA-C 9/22.  During that time she is doing well from a cardiac standpoint.  She requested clearance for back injection.  She was started on Vascepa for elevated triglycerides.  She presented to the emergency department 09/29/2021 after mechanical fall.  She had struck her head.  CT of her head and MRI of her head were unremarkable aside from a parotid mass.  She was referred to ENT who planned a repeat scan in 6  months. ? ?She was seen by Laurann Montana, NP-C 12/06/2021.  During that time she reported weakness and pressure between her shoulder blades.  She also reported irregular heartbeat.  During the time of her evaluation her palpitations had resolved.  She had used as needed metoprolol.  She reported feeling unsteady on her feet and felt that she may fall.  She has started to use a cane.  She was scheduled to start physical therapy and balance training.  She denied lightheadedness and dizziness.  She wondered if her general weakness was related to her age or if she had something wrong.  It was felt that her weakness was related to deconditioning and she was encouraged to participate in her upcoming physical therapy.  An echocardiogram was ordered and showed normal LVEF, moderate ventricular hypertrophy, G2 DD.  She was also noted to have a sending aortic dilation of 40 mm and mild aortic stenosis.  1 year repeat echocardiogram was recommended. ? ?She presents to the clinic today for follow-up evaluation states she was instructed to increase her metoprolol to 1 tablet twice daily.  She received her back injection which is helped with her back pain.  She was recently seen by her PCP who changed her diabetes medication.  She reports that she generally feels much better and is feeling well at this time.  She has not missed any doses of her warfarin and denies bleeding issues.  We will continue her current medication regimen which she is tolerating without side effects.  We will plan follow-up in 4 to 6 months. ? ?Today she denies chest pain, shortness of breath, lower extremity edema, fatigue, palpitations, melena, hematuria, hemoptysis, diaphoresis, weakness, presyncope, syncope, orthopnea, and PND. ? ? ? ? ?Home Medications  ?  ?Prior to Admission medications   ?Medication Sig Start Date End Date Taking? Authorizing Provider  ?Blood Glucose Monitoring Suppl (ACCU-CHEK GUIDE ME) w/Device KIT USE AS DIRECTED.  Dx E11.9.  11/10/20   Hilts, Legrand Como, MD  ?buPROPion (WELLBUTRIN XL) 150 MG 24 hr tablet Take 150 mg by mouth every morning. ?Patient not taking: Reported on 12/06/2021 09/06/21   [provider]  ?calcium carbonate (TUMS - DOSED IN MG ELEMENTAL CALCIUM) 500 MG chewable tablet Chew 2 tablets by mouth daily as needed for indigestion or heartburn.    [provider]  ?Calcium Carbonate-Vitamin D (CALCIUM 600 + D PO) Take 1 tablet by mouth daily.    [provider]  ?cetirizine (ZYRTEC) 10 MG tablet Take 10 mg by mouth daily as needed for allergies.    [provider]  ?CINNAMON PO Take 2 tablets by mouth daily.    [provider]  ?citalopram (CELEXA) 40 MG tablet Take 1 tablet (40 mg total) by mouth daily. 10/14/20   Hilts, Legrand Como,  MD  ?clonazePAM (KLONOPIN) 0.5 MG tablet TAKE 1/2 TO 1 (ONE-HALF TO ONE) TABLET BY MOUTH AT BEDTIME AS NEEDED ?Patient taking differently: Take 0.25-0.5 mg by mouth daily as needed for anxiety. 11/30/20   Hilts, Legrand Como, MD  ?Cyanocobalamin (VITAMIN B12) 1000 MCG TBCR Take 1,000 mcg by mouth daily.    [provider]  ?diazepam (VALIUM) 5 MG tablet Take 1 tablet 5 mg 30 mins prior to the MRI, may atake a 2nd tab id need right before the procedure ?Patient not taking: Reported on 12/06/2021 10/05/21   Rondel Jumbo, PA-C  ?diltiazem (CARTIA XT) 180 MG 24 hr capsule Take 1 capsule (180 mg total) by mouth daily. 07/29/21   Troy Sine, MD  ?Eszopiclone 3 MG TABS Take 1 tablet (3 mg total) by mouth at bedtime as needed. Take immediately before bedtime ?Patient taking differently: Take 3 mg by mouth at bedtime as needed (sleep). Take immediately before bedtime 11/09/20   Hilts, Michael, MD  ?Ferrous Sulfate (IRON) 325 (65 Fe) MG TABS Take 325 mg by mouth 2 (two) times daily.    [provider]  ?fluconazole (DIFLUCAN) 150 MG tablet Take 1 tablet (150 mg total) by mouth every three (3) days as needed. 11/24/20   Hilts, Michael, MD  ?glucose blood  (ACCU-CHEK GUIDE) test strip CHECK BLOOD GLUCOSE IN THE MORNING AND AT BEDTIME.  E11.9 code. 11/26/20   Hilts, Legrand Como, MD  ?magic mouthwash (lidocaine, diphenhydrAMINE, alum & mag hydroxide) suspension Swish a

## 2022-02-02 ENCOUNTER — Ambulatory Visit (INDEPENDENT_AMBULATORY_CARE_PROVIDER_SITE_OTHER): Payer: Medicare Other | Admitting: Endocrinology

## 2022-02-02 ENCOUNTER — Encounter: Payer: Self-pay | Admitting: Endocrinology

## 2022-02-02 VITALS — BP 152/78 | HR 67 | Ht 63.0 in | Wt 238.8 lb

## 2022-02-02 DIAGNOSIS — E1165 Type 2 diabetes mellitus with hyperglycemia: Secondary | ICD-10-CM

## 2022-02-02 DIAGNOSIS — E119 Type 2 diabetes mellitus without complications: Secondary | ICD-10-CM

## 2022-02-02 LAB — POCT GLYCOSYLATED HEMOGLOBIN (HGB A1C): Hemoglobin A1C: 7.9 % — AB (ref 4.0–5.6)

## 2022-02-02 MED ORDER — REPAGLINIDE 1 MG PO TABS: 1.0000 mg | ORAL_TABLET | Freq: Two times a day (BID) | ORAL | 1 refills | Status: DC

## 2022-02-02 MED ORDER — RYBELSUS 3 MG PO TABS: 3.0000 mg | ORAL_TABLET | Freq: Every day | ORAL | 1 refills | Status: DC

## 2022-02-02 NOTE — Patient Instructions (Signed)
check your blood sugar once a day.  vary the time of day when you check, between before the 3 meals, and at bedtime.  also check if you have symptoms of your blood sugar being too high or too low.  please keep a record of the readings and bring it to your next appointment here (or you can bring the meter itself).  You can write it on any piece of paper.  please call us sooner if your blood sugar goes below 70, or if most of your readings are over 200.   ?I have sent 2 prescriptions to your pharmacy: to add rybelsus, and to reduce the repaglinide.   ?Please continue the same metformin ?If you feel the blood sugar is going low, you should check it.  Please call if it goes below 70.   ?You should have an endocrinology follow-up appointment in 3 months.   ?

## 2022-02-02 NOTE — Progress Notes (Signed)
? ?Subjective:  ? ? Patient ID: Debra Wright, female    DOB: 08-06-1951, 71 y.o.   MRN: [id number] ? ?HPI ?Pt returns for f/u of diabetes mellitus:  ?DM type: 2 ?Dx'ed: 2014 ?Complications: none ?Therapy: 2 oral meds ?GDM: never ?DKA: never ?Severe hypoglycemia: never ?Pancreatitis: never ?Pancreatic imaging: normal on 2003 CT ?SDOH: she cannot afford brand name meds; edema limits rx options.     ?Other: she has never been on insulin.   ?Interval history: no cbg record, but states cbg varies from 70-175.  pt states she feels well in general.  She takes meds as rx'ed.  ?Past Medical History:  ?Diagnosis Date  ? Anxiety   ? Arthritis   ? "knees; right thumb" (10/14/2014)  ? Basal cell carcinoma   ? "burned off upper lip & right shoulder"  ? Chronic anticoagulation   ? CHADS VASC=3  ? Complication of anesthesia   ? after spinal anesthesia for left knee replacement, bladder did "not wake up". Was incontinent for 4 days post surgery  ? Depression   ? Fibroid tumor   ? Frequent diarrhea   ? GERD (gastroesophageal reflux disease)   ? High cholesterol   ? Hypertension   ? EF 65-70% grade 2DD  ? Insomnia   ? Migraine   ? "frequently when I was younger; maybe monthly now" (10/14/2014) - none after hysterectomy  ? PAF (paroxysmal atrial fibrillation) ([place]) 05/02/2018  ? converted with rate control  ? Pernicious anemia   ? pt not aware of this  ? Sleep apnea   ? uses a cpap  ? Type II diabetes mellitus ([place])   ? Vitamin B 12 deficiency   ? ? ?Past Surgical History:  ?Procedure Laterality Date  ? ABDOMINAL HYSTERECTOMY  1988  ? ACHILLES TENDON SURGERY Right   ? APPENDECTOMY  1988  ? BILATERAL OOPHORECTOMY  2004  ? CARPAL TUNNEL RELEASE Bilateral 1986  ? CATARACT EXTRACTION W/ INTRAOCULAR LENS  IMPLANT, BILATERAL Bilateral 2011  ? COLONOSCOPY    ? JOINT REPLACEMENT    ? SHOULDER ARTHROSCOPY DISTAL CLAVICLE EXCISION AND OPEN ROTATOR CUFF REPAIR Right 11/2013  ? TOENAIL EXCISION Right 04/2018  ? "big toe"  ? TONSILLECTOMY  1950's  ?  TOTAL KNEE ARTHROPLASTY Left 10/13/2014  ? Procedure: LEFT TOTAL KNEE ARTHROPLASTY;  Surgeon: Mcarthur Rossetti, MD;  Location: [facility];  Service: Orthopedics;  Laterality: Left;  ? TOTAL KNEE ARTHROPLASTY Right 12/10/2018  ? Procedure: RIGHT TOTAL KNEE ARTHROPLASTY;  Surgeon: Mcarthur Rossetti, MD;  Location: [facility];  Service: Orthopedics;  Laterality: Right;  ? ? ?Social History  ? ?Socioeconomic History  ? Marital status: Married  ?  Spouse name: Not on file  ? Number of children: 1  ? Years of education: 57  ? Highest education level: Not on file  ?Occupational History  ? Not on file  ?Tobacco Use  ? Smoking status: Never  ? Smokeless tobacco: Never  ?Vaping Use  ? Vaping Use: Never used  ?Substance and Sexual Activity  ? Alcohol use: Yes  ?  Comment: 10/14/2014 "might have a drink when I'm out once/month"  ? Drug use: No  ? Sexual activity: Not Currently  ?Other Topics Concern  ? Not on file  ?Social History Narrative  ? 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 Married   ?   ? Right handed   ? Drinks caffeine one cup of coffee daily  ? An apartment   ? ?  Social Determinants of Health  ? ?Financial Resource Strain: Not on file  ?Food Insecurity: Not on file  ?Transportation Needs: Not on file  ?Physical Activity: Not on file  ?Stress: Not on file  ?Social Connections: Not on file  ?Intimate Partner Violence: Not on file  ? ? ?Current Outpatient Medications on File Prior to Visit  ?Medication Sig Dispense Refill  ? Blood Glucose Monitoring Suppl (ACCU-CHEK GUIDE ME) w/Device KIT USE AS DIRECTED.  Dx E11.9. 1 kit 11  ? buPROPion (WELLBUTRIN XL) 150 MG 24 hr tablet Take 150 mg by mouth every morning.    ? calcium carbonate (TUMS - DOSED IN MG ELEMENTAL CALCIUM) 500 MG chewable tablet Chew 2 tablets by mouth daily as needed for indigestion or heartburn.    ? Calcium Carbonate-Vitamin D (CALCIUM 600 + D PO) Take 1 tablet by mouth daily.    ? cetirizine (ZYRTEC) 10 MG tablet Take 10 mg by mouth daily as needed  for allergies.    ? CINNAMON PO Take 2 tablets by mouth daily.    ? citalopram (CELEXA) 40 MG tablet Take 1 tablet (40 mg total) by mouth daily. 90 tablet 2  ? clonazePAM (KLONOPIN) 0.5 MG tablet TAKE 1/2 TO 1 (ONE-HALF TO ONE) TABLET BY MOUTH AT BEDTIME AS NEEDED (Patient taking differently: Take 0.25-0.5 mg by mouth daily as needed for anxiety.) 30 tablet 0  ? Cyanocobalamin (VITAMIN B12) 1000 MCG TBCR Take 1,000 mcg by mouth daily.    ? diazepam (VALIUM) 5 MG tablet Take 1 tablet 5 mg 30 mins prior to the MRI, may atake a 2nd tab id need right before the procedure 2 tablet 0  ? diltiazem (CARTIA XT) 180 MG 24 hr capsule Take 1 capsule (180 mg total) by mouth daily. 90 capsule 3  ? Eszopiclone 3 MG TABS Take 1 tablet (3 mg total) by mouth at bedtime as needed. Take immediately before bedtime (Patient taking differently: Take 3 mg by mouth at bedtime as needed (sleep). Take immediately before bedtime) 30 tablet 3  ? Ferrous Sulfate (IRON) 325 (65 Fe) MG TABS Take 325 mg by mouth 2 (two) times daily.    ? fluconazole (DIFLUCAN) 150 MG tablet Take 1 tablet (150 mg total) by mouth every three (3) days as needed. 5 tablet 0  ? glucose blood (ACCU-CHEK GUIDE) test strip CHECK BLOOD GLUCOSE IN THE MORNING AND AT BEDTIME.  E11.9 code. 100 strip 12  ? magic mouthwash (lidocaine, diphenhydrAMINE, alum & mag hydroxide) suspension Swish and spit 5 mLs 4 (four) times daily as needed for mouth pain. 360 mL 3  ? magnesium oxide (MAG-OX) 400 (241.3 Mg) MG tablet Take 400 mg by mouth daily.    ? metFORMIN (GLUCOPHAGE) 1000 MG tablet Take 1 tablet (1,000 mg total) by mouth 2 (two) times daily with a meal. 180 tablet 0  ? Omega-3 Fatty Acids (FISH OIL) 1200 MG CAPS Take 1,200 mg by mouth 2 (two) times daily.    ? omeprazole (PRILOSEC) 40 MG capsule Take 1 capsule by mouth once daily 90 capsule 0  ? spironolactone (ALDACTONE) 25 MG tablet Take 1/2 (one-half) tablet by mouth once daily 45 tablet 3  ? tiZANidine (ZANAFLEX) 2 MG tablet  Take 1-2 tablets (2-4 mg total) by mouth every 6 (six) hours as needed for muscle spasms. 60 tablet 6  ? tolterodine (DETROL LA) 4 MG 24 hr capsule TAKE 2 CAPSULES BY MOUTH ONCE DAILY 180 capsule 3  ? tolterodine (DETROL) 2 MG tablet  Take 2 mg by mouth 2 (two) times daily.    ? vitamin C (ASCORBIC ACID) 250 MG tablet Take 250 mg by mouth daily.    ? warfarin (COUMADIN) 5 MG tablet TAKE 1/2 TO 1 TABLET BY MOUTH EVERY DAY AS DIRECTED BY CLINIC 90 tablet 0  ? rosuvastatin (CRESTOR) 20 MG tablet Take 1 tablet (20 mg total) by mouth daily. 90 tablet 1  ? ?No current facility-administered medications on file prior to visit.  ? ? ?Allergies  ?Allergen Reactions  ? Antivert [Meclizine Hcl] Other (See Comments)  ?  "makes me feel like my skin is falling off". ? ?03/05/20 pt states she was placed on this a few months back and did not have a reaction  ? Meclizine Other (See Comments)  ?  "makes me feel like my skin is falling off". ? ?03/05/20 pt states she was placed on this a few months back and did not have a reaction ?Other reaction(s): jumping out of skin, jittery  ? Zolpidem Other (See Comments)  ?  Reverse affect  ? Zolpidem Tartrate   ?  Other reaction(s): doing things in sleep unaware  ? ? ?Family History  ?Problem Relation Age of Onset  ? Arthritis Mother   ? Hyperlipidemia Mother   ? Diabetes Mother   ? Hypertension Mother   ? Arthritis Father   ? Hyperlipidemia Father   ? Heart disease Father   ? Diabetes Father   ? Stroke Brother   ? Hypertension Brother   ? Diabetes Brother   ? Arthritis Maternal Grandmother   ? Hyperlipidemia Maternal Grandmother   ? Diabetes Maternal Grandmother   ? Hypertension Maternal Grandmother   ? Arthritis Maternal Grandfather   ? Hyperlipidemia Maternal Grandfather   ? Hypertension Maternal Grandfather   ? Arthritis Paternal Grandmother   ? Hyperlipidemia Paternal Grandmother   ? Diabetes Paternal Grandmother   ? Hypertension Paternal Grandmother   ? Arthritis Paternal Grandfather   ?  Hyperlipidemia Paternal Grandfather   ? Diabetes Paternal Grandfather   ? Hypertension Paternal Grandfather   ? ? ?BP (!) 152/78 (BP Location: Left Arm, Patient Position: Sitting, Cuff Size: Normal)   Pulse

## 2022-02-03 ENCOUNTER — Ambulatory Visit (INDEPENDENT_AMBULATORY_CARE_PROVIDER_SITE_OTHER): Payer: Medicare Other

## 2022-02-03 ENCOUNTER — Ambulatory Visit (INDEPENDENT_AMBULATORY_CARE_PROVIDER_SITE_OTHER): Payer: Medicare Other | Admitting: General Practice

## 2022-02-03 ENCOUNTER — Encounter: Payer: Self-pay | Admitting: General Practice

## 2022-02-03 VITALS — BP 132/74 | HR 53 | Resp 18 | Ht 63.0 in | Wt 238.4 lb

## 2022-02-03 DIAGNOSIS — I10 Essential (primary) hypertension: Secondary | ICD-10-CM

## 2022-02-03 DIAGNOSIS — E785 Hyperlipidemia, unspecified: Secondary | ICD-10-CM | POA: Diagnosis not present

## 2022-02-03 DIAGNOSIS — I480 Paroxysmal atrial fibrillation: Secondary | ICD-10-CM

## 2022-02-03 DIAGNOSIS — R531 Weakness: Secondary | ICD-10-CM

## 2022-02-03 DIAGNOSIS — I4891 Unspecified atrial fibrillation: Secondary | ICD-10-CM

## 2022-02-03 DIAGNOSIS — Z7901 Long term (current) use of anticoagulants: Secondary | ICD-10-CM | POA: Diagnosis not present

## 2022-02-03 LAB — POCT INR: INR: 4.8 — AB (ref 2.0–3.0)

## 2022-02-03 MED ORDER — METOPROLOL TARTRATE 25 MG PO TABS: 25.0000 mg | ORAL_TABLET | Freq: Two times a day (BID) | ORAL | 3 refills | Status: DC

## 2022-02-03 NOTE — Patient Instructions (Signed)
Medication Instructions:  ?Your Physician recommend you continue on your current medication as directed.   ? ?*If you need a refill on your cardiac medications before your next appointment, please call your pharmacy* ? ? ?Follow-Up: ?At [facility], you and your health needs are our priority.  As part of our continuing mission to provide you with exceptional heart care, we have created designated Provider Care Teams.  These Care Teams include your primary Cardiologist (physician) and Advanced Practice Providers (APPs -  Physician Assistants and Nurse Practitioners) who all work together to provide you with the care you need, when you need it. ? ?We recommend signing up for the patient portal called "MyChart".  Sign up information is provided on this After Visit Summary.  MyChart is used to connect with patients for Virtual Visits (Telemedicine).  Patients are able to view lab/test results, encounter notes, upcoming appointments, etc.  Non-urgent messages can be sent to your provider as well.   ?To learn more about what you can do with MyChart, go to [email or web address].   ? ?Your next appointment:   ? Mondat July 10, 2022 at 1:20 pm ? ?The format for your next appointment:   ?In Person ? ?Provider:   ?Shelva Majestic, MD  ? ? ?Other Instructions ?Report to coumadin clinic today. ? ?

## 2022-02-03 NOTE — Patient Instructions (Signed)
HOLD TONIGHT AND Saturday and then continue taking warfarin 5 mg daily except 2.5 mg each Sunday. Repeat INR in 1 week. ?

## 2022-02-09 ENCOUNTER — Other Ambulatory Visit (HOSPITAL_COMMUNITY): Payer: Self-pay

## 2022-02-09 ENCOUNTER — Telehealth: Payer: Self-pay | Admitting: Pharmacy Technician

## 2022-02-09 NOTE — Telephone Encounter (Signed)
Patient Advocate Encounter ? ?Left HIPAA compliant voice message for pt to see if she was able to purchase this medication at [facility]. ?  ?Received notification from [facility] that prior authorization for Rybelsus '3mg'$  is required by his/her insurance [employer]. ? ?Per Test claim, Trulicity; Bydureon preferred.  (I don't see evidence of either in the pt's chart) ?  ?PA submitted on 02/09/22 ?Key [id number] ?Status is pending ?   ?[facility] will continue to follow: ? ?Patient Advocate ?Fax:  [phone] ? ?

## 2022-02-10 ENCOUNTER — Telehealth: Payer: Self-pay

## 2022-02-10 NOTE — Telephone Encounter (Signed)
Lpm to check INR 

## 2022-02-12 ENCOUNTER — Encounter: Payer: Self-pay | Admitting: Endocrinology

## 2022-02-13 ENCOUNTER — Other Ambulatory Visit: Payer: Self-pay | Admitting: Endocrinology

## 2022-02-13 ENCOUNTER — Ambulatory Visit (INDEPENDENT_AMBULATORY_CARE_PROVIDER_SITE_OTHER): Payer: Medicare Other | Admitting: Internal Medicine

## 2022-02-13 ENCOUNTER — Encounter: Payer: Self-pay | Admitting: Internal Medicine

## 2022-02-13 DIAGNOSIS — Z7901 Long term (current) use of anticoagulants: Secondary | ICD-10-CM | POA: Diagnosis not present

## 2022-02-13 DIAGNOSIS — E1165 Type 2 diabetes mellitus with hyperglycemia: Secondary | ICD-10-CM

## 2022-02-13 LAB — POCT INR: INR: 2.3 (ref 2.0–3.0)

## 2022-02-13 NOTE — Progress Notes (Signed)
This encounter was created in error - please disregard.

## 2022-02-20 ENCOUNTER — Other Ambulatory Visit: Payer: Self-pay

## 2022-02-20 DIAGNOSIS — E1165 Type 2 diabetes mellitus with hyperglycemia: Secondary | ICD-10-CM

## 2022-02-20 MED ORDER — TRULICITY 0.75 MG/0.5ML ~~LOC~~ SOAJ: 0.7500 mg | SUBCUTANEOUS | 0 refills | Status: DC

## 2022-02-20 NOTE — Telephone Encounter (Signed)
Informed patient that Trulicity is preferred by her insurance company as an alternative to Rybelsus and she agrees to begin taking the weekly shots. Okay to go ahead and send in?

## 2022-02-20 NOTE — Telephone Encounter (Signed)
RX has now been sent to patient's preferred pharmacy

## 2022-02-24 ENCOUNTER — Ambulatory Visit (INDEPENDENT_AMBULATORY_CARE_PROVIDER_SITE_OTHER): Payer: Medicare Other | Admitting: Cardiology

## 2022-02-24 DIAGNOSIS — Z7901 Long term (current) use of anticoagulants: Secondary | ICD-10-CM | POA: Diagnosis not present

## 2022-02-24 LAB — POCT INR: INR: 3.8 — AB (ref 2.0–3.0)

## 2022-02-27 ENCOUNTER — Encounter: Payer: Self-pay | Admitting: Physician Assistant

## 2022-03-03 ENCOUNTER — Telehealth: Payer: Self-pay

## 2022-03-03 ENCOUNTER — Ambulatory Visit (INDEPENDENT_AMBULATORY_CARE_PROVIDER_SITE_OTHER): Payer: Medicare Other | Admitting: Pharmacist

## 2022-03-03 DIAGNOSIS — I480 Paroxysmal atrial fibrillation: Secondary | ICD-10-CM | POA: Diagnosis not present

## 2022-03-03 DIAGNOSIS — Z7901 Long term (current) use of anticoagulants: Secondary | ICD-10-CM

## 2022-03-03 LAB — POCT INR: INR: 1.6 — AB (ref 2.0–3.0)

## 2022-03-03 NOTE — Patient Instructions (Signed)
Description   Take 1 and 1/2 tablets today and then continue taking warfarin 5 mg daily except 2.5 mg each Sunday. Repeat INR in 1 week.

## 2022-03-03 NOTE — Telephone Encounter (Signed)
done

## 2022-03-03 NOTE — Telephone Encounter (Signed)
Lpm to check INR 

## 2022-03-08 DIAGNOSIS — N3281 Overactive bladder: Secondary | ICD-10-CM | POA: Diagnosis not present

## 2022-03-08 DIAGNOSIS — I10 Essential (primary) hypertension: Secondary | ICD-10-CM | POA: Diagnosis not present

## 2022-03-08 DIAGNOSIS — K219 Gastro-esophageal reflux disease without esophagitis: Secondary | ICD-10-CM | POA: Diagnosis not present

## 2022-03-08 DIAGNOSIS — F419 Anxiety disorder, unspecified: Secondary | ICD-10-CM | POA: Diagnosis not present

## 2022-03-08 DIAGNOSIS — I480 Paroxysmal atrial fibrillation: Secondary | ICD-10-CM | POA: Diagnosis not present

## 2022-03-08 DIAGNOSIS — E7800 Pure hypercholesterolemia, unspecified: Secondary | ICD-10-CM | POA: Diagnosis not present

## 2022-03-08 DIAGNOSIS — E538 Deficiency of other specified B group vitamins: Secondary | ICD-10-CM | POA: Diagnosis not present

## 2022-03-08 DIAGNOSIS — Z1159 Encounter for screening for other viral diseases: Secondary | ICD-10-CM | POA: Diagnosis not present

## 2022-03-08 DIAGNOSIS — E1169 Type 2 diabetes mellitus with other specified complication: Secondary | ICD-10-CM | POA: Diagnosis not present

## 2022-03-08 DIAGNOSIS — I700 Atherosclerosis of aorta: Secondary | ICD-10-CM | POA: Diagnosis not present

## 2022-03-08 DIAGNOSIS — G4733 Obstructive sleep apnea (adult) (pediatric): Secondary | ICD-10-CM | POA: Diagnosis not present

## 2022-03-08 DIAGNOSIS — Z0000 Encounter for general adult medical examination without abnormal findings: Secondary | ICD-10-CM | POA: Diagnosis not present

## 2022-03-08 DIAGNOSIS — D6869 Other thrombophilia: Secondary | ICD-10-CM | POA: Diagnosis not present

## 2022-03-09 ENCOUNTER — Encounter: Payer: Self-pay | Admitting: Endocrinology

## 2022-03-09 DIAGNOSIS — E1165 Type 2 diabetes mellitus with hyperglycemia: Secondary | ICD-10-CM

## 2022-03-10 ENCOUNTER — Encounter: Payer: Self-pay | Admitting: Endocrinology

## 2022-03-10 ENCOUNTER — Ambulatory Visit (INDEPENDENT_AMBULATORY_CARE_PROVIDER_SITE_OTHER): Payer: Medicare Other

## 2022-03-10 DIAGNOSIS — I480 Paroxysmal atrial fibrillation: Secondary | ICD-10-CM | POA: Diagnosis not present

## 2022-03-10 DIAGNOSIS — Z7901 Long term (current) use of anticoagulants: Secondary | ICD-10-CM | POA: Diagnosis not present

## 2022-03-10 LAB — POCT INR: INR: 3.6 — AB (ref 2.0–3.0)

## 2022-03-10 MED ORDER — METFORMIN HCL 1000 MG PO TABS: 1000.0000 mg | ORAL_TABLET | Freq: Two times a day (BID) | ORAL | 0 refills | Status: DC

## 2022-03-10 NOTE — Patient Instructions (Signed)
Description   Spoke with pt over the phone. Instructed to only take 1/2 tablet today and then continue taking warfarin 5 mg daily except 2.5 mg each Sunday.  Stay consistent with greens (3 per week)  Repeat INR in 1 week. *Self Tester*

## 2022-03-10 NOTE — Telephone Encounter (Signed)
Taken care of in previous encounter

## 2022-03-20 ENCOUNTER — Ambulatory Visit (INDEPENDENT_AMBULATORY_CARE_PROVIDER_SITE_OTHER): Payer: Medicare Other | Admitting: Cardiology

## 2022-03-20 ENCOUNTER — Telehealth: Payer: Self-pay | Admitting: *Deleted

## 2022-03-20 DIAGNOSIS — Z5181 Encounter for therapeutic drug level monitoring: Secondary | ICD-10-CM

## 2022-03-20 DIAGNOSIS — I480 Paroxysmal atrial fibrillation: Secondary | ICD-10-CM

## 2022-03-20 DIAGNOSIS — Z7901 Long term (current) use of anticoagulants: Secondary | ICD-10-CM

## 2022-03-20 LAB — POCT INR: INR: 1.8 — AB (ref 2.0–3.0)

## 2022-03-20 NOTE — Telephone Encounter (Signed)
Called pt since she is overdue for INR; left a message to call back regarding this. Will await.

## 2022-03-21 DIAGNOSIS — E538 Deficiency of other specified B group vitamins: Secondary | ICD-10-CM | POA: Diagnosis not present

## 2022-03-22 DIAGNOSIS — M545 Low back pain, unspecified: Secondary | ICD-10-CM | POA: Diagnosis not present

## 2022-03-22 DIAGNOSIS — M5416 Radiculopathy, lumbar region: Secondary | ICD-10-CM | POA: Diagnosis not present

## 2022-03-27 ENCOUNTER — Telehealth: Payer: Self-pay

## 2022-03-27 ENCOUNTER — Encounter: Payer: Self-pay | Admitting: Cardiovascular Disease

## 2022-03-27 ENCOUNTER — Ambulatory Visit: Payer: Medicare Other | Admitting: Cardiovascular Disease

## 2022-03-28 ENCOUNTER — Telehealth: Payer: Self-pay | Admitting: *Deleted

## 2022-03-28 ENCOUNTER — Telehealth: Payer: Self-pay

## 2022-03-29 LAB — POCT INR: INR: 2.6 (ref 2.0–3.0)

## 2022-03-30 ENCOUNTER — Telehealth: Payer: Self-pay

## 2022-03-30 ENCOUNTER — Ambulatory Visit (INDEPENDENT_AMBULATORY_CARE_PROVIDER_SITE_OTHER): Payer: Medicare Other | Admitting: *Deleted

## 2022-03-30 DIAGNOSIS — Z7901 Long term (current) use of anticoagulants: Secondary | ICD-10-CM | POA: Diagnosis not present

## 2022-03-30 DIAGNOSIS — N390 Urinary tract infection, site not specified: Secondary | ICD-10-CM | POA: Diagnosis not present

## 2022-03-30 DIAGNOSIS — I480 Paroxysmal atrial fibrillation: Secondary | ICD-10-CM

## 2022-03-30 NOTE — Telephone Encounter (Signed)
   Name: Debra Wright  DOB: [birth year]  MRN: [id number]   Primary Cardiologist: Shelva Majestic, MD  Chart reviewed as part of pre-operative protocol coverage. Debra Wright was last seen on 02/03/2022 by Coletta Memos, NP.  She was doing well at that time.  Pharmacological clearance was requested.  Per our pharmacy staff the patient may hold Coumadin for 5 days prior to procedure.  The patient will not need bridging with Lovenox.  The patient can start Coumadin back when deemed safe by the surgeon.  I will route this recommendation to the requesting party via Epic fax function and remove from pre-op pool. Please call with questions.  Elgie Collard, PA-C 03/30/2022, 4:41 PM

## 2022-03-30 NOTE — Telephone Encounter (Signed)
Patient with diagnosis of afib on warfarin for anticoagulation.    Procedure:  LESI L5-S1 Date of procedure: TBD   CHA2DS2-VASc Score = 5   This indicates a 7.2% annual risk of stroke. The patient's score is based upon: CHF History: 1 HTN History: 1 Diabetes History: 1 Stroke History: 0 Vascular Disease History: 0 Age Score: 1 Gender Score: 1      Per office protocol, patient can hold warfarin for 5 days prior to procedure.    Patient will NOT need bridging with Lovenox (enoxaparin) around procedure.  **This guidance is not considered finalized until pre-operative APP has relayed final recommendations.**

## 2022-03-30 NOTE — Telephone Encounter (Signed)
   Pre-operative Risk Assessment    Patient Name: Debra Wright  DOB: 1951/01/14 MRN: [id number]      Request for Surgical Clearance    Procedure:   LESI L5-S1  Date of Surgery:  Clearance TBD                                 Surgeon:  Blair Hailey, MD Surgeon's Group or Practice Name:  [facility] Phone number:  [phone] Fax number:  [phone]   Type of Clearance Requested:   - Pharmacy:  Hold Warfarin (Coumadin) 5   Type of Anesthesia:  Not Indicated   Additional requests/questions:   The patient can resume Warfarin the next morning.  Signed, Elsie Lincoln Giorgi Debruin   03/30/2022, 2:04 PM

## 2022-03-31 ENCOUNTER — Telehealth: Payer: Self-pay

## 2022-03-31 NOTE — Telephone Encounter (Signed)
Lpmtcb and discuss INR result. °

## 2022-03-31 NOTE — Patient Instructions (Signed)
Description   Spoke with pt and advised pt to continue taking warfarin '5mg'$  daily except 2.'5mg'$  each Sunday. Stay consistent with greens (3 per week). Repeat INR in 1 week. *Self Tester*

## 2022-04-06 ENCOUNTER — Telehealth: Payer: Self-pay

## 2022-04-06 LAB — POCT INR: INR: 3.8 — AB (ref 2.0–3.0)

## 2022-04-06 NOTE — Telephone Encounter (Signed)
Lpm to check INR 

## 2022-04-07 ENCOUNTER — Ambulatory Visit (INDEPENDENT_AMBULATORY_CARE_PROVIDER_SITE_OTHER): Payer: Medicare Other | Admitting: *Deleted

## 2022-04-07 DIAGNOSIS — Z7901 Long term (current) use of anticoagulants: Secondary | ICD-10-CM

## 2022-04-07 DIAGNOSIS — I480 Paroxysmal atrial fibrillation: Secondary | ICD-10-CM | POA: Diagnosis not present

## 2022-04-10 NOTE — Patient Instructions (Signed)
Description   SELF TESTER - Spoke with pt and advised pt since she held dose on 7/6, continue taking warfarin '5mg'$  daily except 2.'5mg'$  each Sunday. Stay consistent with greens (3 per week). Repeat INR in 1 week.

## 2022-04-14 ENCOUNTER — Encounter: Payer: Self-pay | Admitting: Physician Assistant

## 2022-04-14 ENCOUNTER — Ambulatory Visit (INDEPENDENT_AMBULATORY_CARE_PROVIDER_SITE_OTHER): Payer: Medicare Other | Admitting: Physician Assistant

## 2022-04-14 VITALS — BP 130/80 | HR 48 | Ht 62.0 in | Wt 238.4 lb

## 2022-04-14 DIAGNOSIS — E119 Type 2 diabetes mellitus without complications: Secondary | ICD-10-CM | POA: Diagnosis not present

## 2022-04-14 DIAGNOSIS — I480 Paroxysmal atrial fibrillation: Secondary | ICD-10-CM | POA: Diagnosis not present

## 2022-04-14 DIAGNOSIS — I10 Essential (primary) hypertension: Secondary | ICD-10-CM | POA: Diagnosis not present

## 2022-04-14 NOTE — Patient Instructions (Signed)
Medication Instructions:  Your physician recommends that you continue on your current medications as directed. Please refer to the Current Medication list given to you today.  *If you need a refill on your cardiac medications before your next appointment, please call your pharmacy*   Lab Work: NONE If you have labs (blood work) drawn today and your tests are completely normal, you will receive your results only by: [place] (if you have MyChart) OR A paper copy in the mail If you have any lab test that is abnormal or we need to change your treatment, we will call you to review the results.   Testing/Procedures: NONE   Follow-Up: At [facility], you and your health needs are our priority.  As part of our continuing mission to provide you with exceptional heart care, we have created designated Provider Care Teams.  These Care Teams include your primary Cardiologist (physician) and Advanced Practice Providers (APPs -  Physician Assistants and Nurse Practitioners) who all work together to provide you with the care you need, when you need it.  We recommend signing up for the patient portal called "MyChart".  Sign up information is provided on this After Visit Summary.  MyChart is used to connect with patients for Virtual Visits (Telemedicine).  Patients are able to view lab/test results, encounter notes, upcoming appointments, etc.  Non-urgent messages can be sent to your provider as well.   To learn more about what you can do with MyChart, go to [email or web address].    Other Instructions Keep upcoming appointment with Dr. Claiborne Billings in October.   Important Information About Sugar

## 2022-04-14 NOTE — Progress Notes (Unsigned)
DM  Cardiology Office Note:    Date:  04/16/2022   ID:  Shyenne, Maggard 1950-10-25, MRN [id number]  PCP:  [name], [place] Prov[facility] Cardiologist:  Shelva Majestic, MD     Referring MD: [name], PA   No chief complaint on file.   History of Present Illness:    Janiyah Beery is a 71 y.o. female with a hx of hypertension, DM II, paroxysmal atrial fibrillation, obstructive sleep apnea, and insomnia.   In August 2019, she had steroid injection to her right knee, upon returning home she had acute onset of palpitation associated with chest pain and diaphoresis.  EMS tracing recorded atrial fibrillation with RVR.  While in the emergency room, her heart rate was between 150-180s, she was given IV metoprolol and converted to sinus rhythm.  Her amlodipine was changed to diltiazem.  Echocardiogram at the time showed EF was normal, grade 2 DD, aortic valve calcification without stenosis, mild to moderate LAE.  Myoview in August 2019 was negative for ischemia.  Her metoprolol was later decreased due to bradycardia.  Patient was seen in the ED in December 2020 after mechanical fall and struck her head.  CT of the head and MRI of the brain was unremarkable but did reveal a parotid mass.  She was referred to ENT.  She was seen for weakness and pressure between her shoulder blade in March 2023 and the reported irregular rhythm.  She had to use as needed dose of metoprolol.  She was also unsteady on her feet and falls.  It was felt that her weakness was related to deconditioning and she was encouraged to participate in physical therapy.  Echocardiogram showed normal EF, moderate LVH, grade 2 DD, dilated ascending aorta measuring at 40 mm and mild aortic stenosis.  Repeat in 1 year study was recommended.  She was last seen by Coletta Memos, NP on 02/03/2022 at which time she was doing well.  Her metoprolol was increased to 25 mg twice a day  Patient presents today for follow-up.   She is maintaining sinus rhythm based on physical scan.  Her heart rate is 48 bpm.  She denies any dizziness, blurred vision or feeling of passing out.  Ever since her beta-blocker was increased, she has not had any recurrent palpitation.  Given lack of symptom, I decided to keep her on the current rate control therapy.  She is quite interested in [place] device.  We discussed the process.  She does wish to get off of anticoagulation therapy if at all possible.  She has quite a significant bruising in her forearm for being on Coumadin.  She says she has bruising even without bumping into things.  I will discuss with Dr. Claiborne Billings next week and potentially refer the patient to Dr. Quentin Ore for evaluation of [place] device.    Past Medical History:  Diagnosis Date   Anxiety    Arthritis    "knees; right thumb" (10/14/2014)   Basal cell carcinoma    "burned off upper lip & right shoulder"   Chronic anticoagulation    CHADS VASC=3   Complication of anesthesia    after spinal anesthesia for left knee replacement, bladder did "not wake up". Was incontinent for 4 days post surgery   Depression    Fibroid tumor    Frequent diarrhea    GERD (gastroesophageal reflux disease)    High cholesterol    Hypertension    EF 65-70%  grade 2DD   Insomnia    Migraine    "frequently when I was younger; maybe monthly now" (10/14/2014) - none after hysterectomy   PAF (paroxysmal atrial fibrillation) ([place]) 05/02/2018   converted with rate control   Pernicious anemia    pt not aware of this   Sleep apnea    uses a cpap   Type II diabetes mellitus ([place])    Vitamin B 12 deficiency     Past Surgical History:  Procedure Laterality Date   ABDOMINAL HYSTERECTOMY  1988   ACHILLES TENDON SURGERY Right    APPENDECTOMY  1988   BILATERAL OOPHORECTOMY  2004   CARPAL TUNNEL RELEASE Bilateral 1986   CATARACT EXTRACTION W/ INTRAOCULAR LENS  IMPLANT, BILATERAL Bilateral 2011   COLONOSCOPY     JOINT REPLACEMENT      SHOULDER ARTHROSCOPY DISTAL CLAVICLE EXCISION AND OPEN ROTATOR CUFF REPAIR Right 11/2013   TOENAIL EXCISION Right 04/2018   "big toe"   TONSILLECTOMY  1950's   TOTAL KNEE ARTHROPLASTY Left 10/13/2014   Procedure: LEFT TOTAL KNEE ARTHROPLASTY;  Surgeon: Mcarthur Rossetti, MD;  Location: [place];  Service: Orthopedics;  Laterality: Left;   TOTAL KNEE ARTHROPLASTY Right 12/10/2018   Procedure: RIGHT TOTAL KNEE ARTHROPLASTY;  Surgeon: Mcarthur Rossetti, MD;  Location: [place];  Service: Orthopedics;  Laterality: Right;    Current Medications: Current Meds  Medication Sig   Blood Glucose Monitoring Suppl (ACCU-CHEK GUIDE ME) w/Device KIT USE AS DIRECTED.  Dx E11.9.   buPROPion (WELLBUTRIN XL) 150 MG 24 hr tablet Take 150 mg by mouth every morning.   calcium carbonate (TUMS - DOSED IN MG ELEMENTAL CALCIUM) 500 MG chewable tablet Chew 2 tablets by mouth daily as needed for indigestion or heartburn.   Calcium Carbonate-Vitamin D (CALCIUM 600 + D PO) Take 1 tablet by mouth daily.   cetirizine (ZYRTEC) 10 MG tablet Take 10 mg by mouth daily as needed for allergies.   CINNAMON PO Take 2 tablets by mouth daily.   clonazePAM (KLONOPIN) 0.5 MG tablet TAKE 1/2 TO 1 (ONE-HALF TO ONE) TABLET BY MOUTH AT BEDTIME AS NEEDED (Patient taking differently: Take 0.25-0.5 mg by mouth daily as needed for anxiety.)   cyanocobalamin (,VITAMIN B-12,) 1000 MCG/ML injection Inject 1,000 mcg into the muscle once a week.   Cyanocobalamin (VITAMIN B12) 1000 MCG TBCR Take 1,000 mcg by mouth daily.   diltiazem (CARTIA XT) 180 MG 24 hr capsule Take 1 capsule (180 mg total) by mouth daily.   Dulaglutide (TRULICITY) 7.65 YY/5.0PT SOPN Inject 0.75 mg into the skin once a week. After 1 month Rx is 1.5 weekly   Eszopiclone 3 MG TABS Take 1 tablet (3 mg total) by mouth at bedtime as needed. Take immediately before bedtime (Patient taking differently: Take 3 mg by mouth at bedtime as needed (sleep). Take immediately before bedtime)    Ferrous Sulfate (IRON) 325 (65 Fe) MG TABS Take 325 mg by mouth 2 (two) times daily.   fluconazole (DIFLUCAN) 150 MG tablet Take 1 tablet (150 mg total) by mouth every three (3) days as needed.   glucose blood (ACCU-CHEK GUIDE) test strip CHECK BLOOD GLUCOSE IN THE MORNING AND AT BEDTIME.  E11.9 code.   magnesium oxide (MAG-OX) 400 (241.3 Mg) MG tablet Take 400 mg by mouth daily.   metFORMIN (GLUCOPHAGE) 1000 MG tablet Take 1 tablet (1,000 mg total) by mouth 2 (two) times daily with a meal.   metoprolol tartrate (LOPRESSOR) 25 MG tablet Take 1 tablet (  25 mg total) by mouth 2 (two) times daily.   Omega-3 Fatty Acids (FISH OIL) 1200 MG CAPS Take 1,200 mg by mouth 2 (two) times daily.   omeprazole (PRILOSEC) 40 MG capsule Take 1 capsule by mouth once daily   repaglinide (PRANDIN) 1 MG tablet Take 1 tablet (1 mg total) by mouth 2 (two) times daily before a meal.   sertraline (ZOLOFT) 50 MG tablet Take 50 mg by mouth daily.   spironolactone (ALDACTONE) 25 MG tablet Take 1/2 (one-half) tablet by mouth once daily   tiZANidine (ZANAFLEX) 2 MG tablet Take 1-2 tablets (2-4 mg total) by mouth every 6 (six) hours as needed for muscle spasms.   tolterodine (DETROL) 2 MG tablet Take 2 mg by mouth 2 (two) times daily.   vitamin C (ASCORBIC ACID) 250 MG tablet Take 250 mg by mouth daily.   warfarin (COUMADIN) 5 MG tablet TAKE 1/2 TO 1 TABLET BY MOUTH EVERY DAY AS DIRECTED BY CLINIC     Allergies:   Antivert [meclizine hcl], Meclizine, Trazodone hcl, Zolpidem, and Zolpidem tartrate   Social History   Socioeconomic History   Marital status: Married    Spouse name: Not on file   Number of children: 1   Years of education: 14   Highest education level: Not on file  Occupational History   Not on file  Tobacco Use   Smoking status: Never   Smokeless tobacco: Never  Vaping Use   Vaping Use: Never used  Substance and Sexual Activity   Alcohol use: Yes    Comment: 10/14/2014 "might have a drink when I'm  out once/month"   Drug use: No   Sexual activity: Not Currently  Other Topics Concern   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, sports for 55   Married       Right handed    Drinks caffeine one cup of coffee daily   An apartment    Social Determinants of Radio broadcast assistant Strain: Not on Art therapist Insecurity: Not on file  Transportation Needs: Not on file  Physical Activity: Not on file  Stress: Not on file  Social Connections: Not on file     Family History: The patient's family history includes Arthritis in her father, maternal grandfather, maternal grandmother, mother, paternal grandfather, and paternal grandmother; Diabetes in her brother, father, maternal grandmother, mother, paternal grandfather, and paternal grandmother; Heart disease in her father; Hyperlipidemia in her father, maternal grandfather, maternal grandmother, mother, paternal grandfather, and paternal grandmother; Hypertension in her brother, maternal grandfather, maternal grandmother, mother, paternal grandfather, and paternal grandmother; Stroke in her brother.  ROS:   Please see the history of present illness.     All other systems reviewed and are negative.  EKGs/Labs/Other Studies Reviewed:    The following studies were reviewed today:  Echo 12/22/2021  1. Left ventricular ejection fraction, by estimation, is 65 to 70%. The  left ventricle has normal function. The left ventricle has no regional  wall motion abnormalities. There is moderate concentric left ventricular  hypertrophy. Left ventricular  diastolic parameters are consistent with Grade II diastolic dysfunction  (pseudonormalization).   2. Right ventricular systolic function is normal. The right ventricular  size is mildly enlarged. There is normal pulmonary artery systolic  pressure. The estimated right ventricular systolic pressure is 46.9 mmHg.   3. Left atrial size was moderately dilated.   4. A small  pericardial effusion  is present. The pericardial effusion is  circumferential. There is no evidence of cardiac tamponade.   5. The mitral valve is grossly normal. Trivial mitral valve  regurgitation. No evidence of mitral stenosis. Moderate mitral annular  calcification.   6. The aortic valve was not well visualized. There is moderate  calcification of the aortic valve. There is moderate thickening of the  aortic valve. Aortic valve regurgitation is not visualized. Mild aortic  valve stenosis.   7. Aortic dilatation noted. Aneurysm of the ascending aorta, measuring 40  mm.   8. The inferior vena cava is dilated in size with >50% respiratory  variability, suggesting right atrial pressure of 8 mmHg.   Comparison(s): Changes from prior study are noted. EF 65%, moderate LVH,  AOR 34m.   Conclusion(s)/Recommendation(s): Ascending aorta measures 40 mm on current  study, otherwise no significant changes.    EKG:  EKG is not ordered today.    Recent Labs: 09/29/2021: BUN 7; Creatinine, Ser 0.70; Hemoglobin 12.6; Platelets 313; Potassium 4.0; Sodium 137  Recent Lipid Panel    Component Value Date/Time   CHOL 138 08/07/2018 1530   TRIG 185 (H) 08/07/2018 1530   HDL 46 (L) 08/07/2018 1530   CHOLHDL 3.0 08/07/2018 1530   VLDL 22 05/03/2018 0153   LDLCALC 66 08/07/2018 1530   LDLDIRECT 42.0 01/31/2018 0943     Risk Assessment/Calculations:    CHA2DS2-VASc Score = 5   This indicates a 7.2% annual risk of stroke. The patient's score is based upon: CHF History: 1 HTN History: 1 Diabetes History: 1 Stroke History: 0 Vascular Disease History: 0 Age Score: 1 Gender Score: 1          Physical Exam:    VS:  BP 130/80   Pulse (!) 48   Ht '5\' 2"'  (1.575 m)   Wt 238 lb 6.4 oz (108.1 kg)   SpO2 99%   BMI 43.60 kg/m        Wt Readings from Last 3 Encounters:  04/14/22 238 lb 6.4 oz (108.1 kg)  02/03/22 238 lb 6.4 oz (108.1 kg)  02/02/22 238 lb 12.8 oz (108.3 kg)     GEN:   Well nourished, well developed in no acute distress HEENT: Normal NECK: No JVD; No carotid bruits LYMPHATICS: No lymphadenopathy CARDIAC: RRR, no murmurs, rubs, gallops RESPIRATORY:  Clear to auscultation without rales, wheezing or rhonchi  ABDOMEN: Soft, non-tender, non-distended MUSCULOSKELETAL:  No edema; No deformity  SKIN: Warm and dry NEUROLOGIC:  Alert and oriented x 3 PSYCHIATRIC:  Normal affect   ASSESSMENT:    1. PAF (paroxysmal atrial fibrillation) ([place]   2. Essential hypertension   3. Controlled type 2 diabetes mellitus without complication, without long-term current use of insulin (HCC)    PLAN:    In order of problems listed above:  Paroxysmal atrial fibrillation: Continue diltiazem, metoprolol and Coumadin.  [place], I will check with the Dr. KClaiborne Billingsand potentially refer patient to Dr. LQuentin Orein the future  Hypertension: Blood pressure stable  DM2: Managed by primary care provider.           Medication Adjustments/Labs and Tests Ordered: Current medicines are reviewed at length with the patient today.  Concerns regarding medicines are outlined above.  No orders of the defined types were placed in this encounter.  No orders of the defined types were placed in this encounter.   Patient Instructions  Medication Instructions:  Your physician recommends that you continue on your  current medications as directed. Please refer to the Current Medication list given to you today.  *If you need a refill on your cardiac medications before your next appointment, please call your pharmacy*   Lab Work: NONE If you have labs (blood work) drawn today and your tests are completely normal, you will receive your results only by: [place] (if you have MyChart) OR A paper copy in the mail If you have any lab test that is abnormal or we need to change your treatment, we will call you to review the  results.   Testing/Procedures: NONE   Follow-Up: At [facility] you and your health needs are our priority.  As part of our continuing mission to provide you with exceptional heart care, we have created designated Provider Care Teams.  These Care Teams include your primary Cardiologist (physician) and Advanced Practice Providers (APPs -  Physician Assistants and Nurse Practitioners) who all work together to provide you with the care you need, when you need it.  We recommend signing up for the patient portal called "MyChart".  Sign up information is provided on this After Visit Summary.  MyChart is used to connect with patients for Virtual Visits (Telemedicine).  Patients are able to view lab/test results, encounter notes, upcoming appointments, etc.  Non-urgent messages can be sent to your provider as well.   To learn more about what you can do with MyChart, go to [email or web address].    Other Instructions Keep upcoming appointment with Dr. Claiborne Billings in October.   Important Information About Sugar         Hilbert Corrigan, [place]  04/16/2022 11:56 PM    [place]

## 2022-04-17 ENCOUNTER — Ambulatory Visit (INDEPENDENT_AMBULATORY_CARE_PROVIDER_SITE_OTHER): Payer: Medicare Other

## 2022-04-17 DIAGNOSIS — I480 Paroxysmal atrial fibrillation: Secondary | ICD-10-CM

## 2022-04-17 DIAGNOSIS — Z7901 Long term (current) use of anticoagulants: Secondary | ICD-10-CM | POA: Diagnosis not present

## 2022-04-17 LAB — POCT INR: INR: 2.6 (ref 2.0–3.0)

## 2022-04-17 NOTE — Patient Instructions (Signed)
Description   SELF TESTER - Spoke with pt and advised pt to continue taking warfarin '5mg'$  daily except 2.'5mg'$  each Sunday. Stay consistent with greens (3 per week). Repeat INR in 1 week.

## 2022-04-20 ENCOUNTER — Other Ambulatory Visit: Payer: Self-pay

## 2022-04-20 ENCOUNTER — Telehealth: Payer: Self-pay

## 2022-04-20 DIAGNOSIS — I480 Paroxysmal atrial fibrillation: Secondary | ICD-10-CM

## 2022-04-20 NOTE — Telephone Encounter (Signed)
Scheduled the patient 9/5 for consult with Dr. Quentin Ore. She was grateful for call and agrees with plan.

## 2022-04-20 NOTE — Telephone Encounter (Signed)
Placed EP referral to Dr. Quentin Ore per staff message from [facility], PA-C

## 2022-04-20 NOTE — Telephone Encounter (Signed)
Per Almyra Deforest, called to arrange [place] consult with Dr. Quentin Ore.  Left message to call back.

## 2022-04-21 ENCOUNTER — Encounter: Payer: Self-pay | Admitting: Cardiovascular Disease

## 2022-04-24 ENCOUNTER — Ambulatory Visit (INDEPENDENT_AMBULATORY_CARE_PROVIDER_SITE_OTHER): Payer: Medicare Other | Admitting: Cardiology

## 2022-04-24 ENCOUNTER — Telehealth: Payer: Self-pay

## 2022-04-24 DIAGNOSIS — I480 Paroxysmal atrial fibrillation: Secondary | ICD-10-CM

## 2022-04-24 DIAGNOSIS — Z7901 Long term (current) use of anticoagulants: Secondary | ICD-10-CM

## 2022-04-24 DIAGNOSIS — Z5181 Encounter for therapeutic drug level monitoring: Secondary | ICD-10-CM | POA: Diagnosis not present

## 2022-04-24 LAB — POCT INR: INR: 3.9 — AB (ref 2.0–3.0)

## 2022-04-24 NOTE — Telephone Encounter (Signed)
Lpm to check INR 

## 2022-04-29 ENCOUNTER — Other Ambulatory Visit: Payer: Self-pay | Admitting: Cardiovascular Disease

## 2022-05-01 LAB — POCT INR: INR: 2.6 (ref 2.0–3.0)

## 2022-05-02 ENCOUNTER — Ambulatory Visit (INDEPENDENT_AMBULATORY_CARE_PROVIDER_SITE_OTHER): Payer: Medicare Other | Admitting: *Deleted

## 2022-05-02 ENCOUNTER — Telehealth: Payer: Self-pay

## 2022-05-02 DIAGNOSIS — Z7901 Long term (current) use of anticoagulants: Secondary | ICD-10-CM | POA: Diagnosis not present

## 2022-05-02 DIAGNOSIS — I480 Paroxysmal atrial fibrillation: Secondary | ICD-10-CM

## 2022-05-02 NOTE — Patient Instructions (Signed)
Description   SELF TESTER - Spoke with pt and advised pt to continue taking warfarin '5mg'$  daily except 2.'5mg'$  each Sunday. Stay consistent with greens (3 per week). Repeat INR in 1 week. [phone]

## 2022-05-02 NOTE — Telephone Encounter (Signed)
I spoke to the patient who is receiving an injection on Monday 8/7.  She was ordered to HOLD Coumadin 8/1-8/7.  I told her to take additional tablet once resumed.  She will check INR 8/14.

## 2022-05-08 DIAGNOSIS — M5416 Radiculopathy, lumbar region: Secondary | ICD-10-CM | POA: Diagnosis not present

## 2022-05-09 ENCOUNTER — Ambulatory Visit: Payer: Medicare Other | Admitting: Physician Assistant

## 2022-05-10 DIAGNOSIS — M542 Cervicalgia: Secondary | ICD-10-CM | POA: Diagnosis not present

## 2022-05-10 DIAGNOSIS — F419 Anxiety disorder, unspecified: Secondary | ICD-10-CM | POA: Diagnosis not present

## 2022-05-10 DIAGNOSIS — Z23 Encounter for immunization: Secondary | ICD-10-CM | POA: Diagnosis not present

## 2022-05-11 ENCOUNTER — Other Ambulatory Visit (INDEPENDENT_AMBULATORY_CARE_PROVIDER_SITE_OTHER): Payer: Medicare Other

## 2022-05-11 DIAGNOSIS — E1165 Type 2 diabetes mellitus with hyperglycemia: Secondary | ICD-10-CM

## 2022-05-11 LAB — COMPREHENSIVE METABOLIC PANEL
ALT: 25 U/L (ref 0–35)
AST: 21 U/L (ref 0–37)
Albumin: 4 g/dL (ref 3.5–5.2)
Alkaline Phosphatase: 65 U/L (ref 39–117)
BUN: 17 mg/dL (ref 6–23)
CO2: 22 mEq/L (ref 19–32)
Calcium: 9.2 mg/dL (ref 8.4–10.5)
Chloride: 98 mEq/L (ref 96–112)
Creatinine, Ser: 0.8 mg/dL (ref 0.40–1.20)
GFR: 73.09 mL/min (ref >60.00)
Glucose, Bld: 371 mg/dL — ABNORMAL HIGH (ref 70–99)
Potassium: 4 mEq/L (ref 3.5–5.1)
Sodium: 132 mEq/L — ABNORMAL LOW (ref 135–145)
Total Bilirubin: 0.3 mg/dL (ref 0.2–1.2)
Total Protein: 6.6 g/dL (ref 6.0–8.3)

## 2022-05-11 LAB — LIPID PANEL
Cholesterol: 146 mg/dL (ref 0–200)
HDL: 35.2 mg/dL — ABNORMAL LOW (ref >39.00)
NonHDL: 111.29
Total CHOL/HDL Ratio: 4
Triglycerides: 360 mg/dL — ABNORMAL HIGH (ref 0.0–149.0)
VLDL: 72 mg/dL — ABNORMAL HIGH (ref 0.0–40.0)

## 2022-05-11 LAB — MICROALBUMIN / CREATININE URINE RATIO
Creatinine,U: 42.9 mg/dL
Microalb Creat Ratio: 1.6 mg/g (ref 0.0–30.0)
Microalb, Ur: <0.7 mg/dL (ref 0.0–1.9)

## 2022-05-11 LAB — LDL CHOLESTEROL, DIRECT: Direct LDL: 82 mg/dL

## 2022-05-11 LAB — HEMOGLOBIN A1C: Hgb A1c MFr Bld: 8.5 % — ABNORMAL HIGH (ref 4.6–6.5)

## 2022-05-12 ENCOUNTER — Other Ambulatory Visit: Payer: Medicare Other

## 2022-05-15 ENCOUNTER — Ambulatory Visit (INDEPENDENT_AMBULATORY_CARE_PROVIDER_SITE_OTHER): Payer: Medicare Other

## 2022-05-15 DIAGNOSIS — I480 Paroxysmal atrial fibrillation: Secondary | ICD-10-CM | POA: Diagnosis not present

## 2022-05-15 DIAGNOSIS — Z7901 Long term (current) use of anticoagulants: Secondary | ICD-10-CM | POA: Diagnosis not present

## 2022-05-15 LAB — POCT INR: INR: 2.4 (ref 2.0–3.0)

## 2022-05-15 NOTE — Patient Instructions (Signed)
Description   SELF TESTER - Spoke with pt and advised pt to continue taking warfarin '5mg'$  daily except 2.'5mg'$  each Sunday. Stay consistent with greens (3 per week). Repeat INR in 1 week. [phone]

## 2022-05-17 ENCOUNTER — Other Ambulatory Visit: Payer: Self-pay

## 2022-05-17 DIAGNOSIS — E1165 Type 2 diabetes mellitus with hyperglycemia: Secondary | ICD-10-CM

## 2022-05-17 MED ORDER — REPAGLINIDE 1 MG PO TABS: 1.0000 mg | ORAL_TABLET | Freq: Two times a day (BID) | ORAL | 1 refills | Status: DC

## 2022-05-18 ENCOUNTER — Ambulatory Visit (INDEPENDENT_AMBULATORY_CARE_PROVIDER_SITE_OTHER): Payer: Medicare Other | Admitting: Endocrinology

## 2022-05-18 ENCOUNTER — Encounter: Payer: Self-pay | Admitting: Endocrinology

## 2022-05-18 VITALS — BP 124/82 | HR 74 | Ht 62.5 in | Wt 225.4 lb

## 2022-05-18 DIAGNOSIS — I480 Paroxysmal atrial fibrillation: Secondary | ICD-10-CM | POA: Diagnosis not present

## 2022-05-18 DIAGNOSIS — E782 Mixed hyperlipidemia: Secondary | ICD-10-CM

## 2022-05-18 DIAGNOSIS — E1165 Type 2 diabetes mellitus with hyperglycemia: Secondary | ICD-10-CM

## 2022-05-18 MED ORDER — EMPAGLIFLOZIN 10 MG PO TABS: 10.0000 mg | ORAL_TABLET | Freq: Every day | ORAL | 3 refills | Status: DC

## 2022-05-18 NOTE — Patient Instructions (Addendum)
Check blood sugars on waking up 2-3 days a week  Also check blood sugars about 2 hours after meals and do this after different meals by rotation  Recommended blood sugar levels on waking up are 90-130 and about 2 hours after meal is 130-180  Please bring your blood sugar monitor to each visit, thank you  Plan meals!  ? Water exercise  Prandin at each meals

## 2022-05-18 NOTE — Progress Notes (Signed)
Patient ID: Debra Wright, female   DOB: 10-Jun-1951, 71 y.o.   MRN: [id number]            Reason for Appointment: Type II Diabetes follow-up   History of Present Illness   Diagnosis date: 2014  Previous history:  Non-insulin hypoglycemic drugs previously used: Metformin, glipizide, repaglinide since 3/22, Januvia Has never been on insulin  A1c range in the last few years is: 7.1-8.3  Recent history:     Non-insulin hypoglycemic drugs: Metformin 1 g twice daily, Prandin 1 mg at breakfast    Side effects from medications: None  Current self management, blood sugar patterns and problems identified:  A1c is 8.5 compared to 7.9 Blood sugars at home in the mornings are not consistently high and she is thinks they may be only high when she goes up the diet the night before However her blood sugar in the lab was 371 and she does not know what made it go up so high She was supposed to take Prandin at least twice a day but she thinks she is taking it only in the morning with her oatmeal She says her blood sugars will be higher from a lot of stress from her husband's health Also not planning her meals well Partly because of knee pain she does not exercise She does not complain of being excessively thirsty but has lost 7 pounds recently She has been prescribed Rybelsus which was not covered by insurance and she could not afford Trulicity  Exercise: minimal  Diet management: Does not plan meals, not always following low-fat low carbohydrate plan     Hypoglycemia:  none    Glucometer: Accu-Chek  Blood Glucose readings from recall:   PRE-MEAL Fasting Lunch Dinner Bedtime Overall  Glucose range: 120-200   ?   Mean/median:        Dietician visit: Most recent:  none    Weight control:  Wt Readings from Last 3 Encounters:  05/18/22 225 lb 6.4 oz (102.2 kg)  04/14/22 238 lb 6.4 oz (108.1 kg)  02/03/22 238 lb 6.4 oz (108.1 kg)            Diabetes labs:  Lab  Results  Component Value Date   HGBA1C 8.5 (H) 05/11/2022   HGBA1C 7.9 (A) 02/02/2022   HGBA1C 7.1 (A) 05/16/2021   Lab Results  Component Value Date   MICROALBUR <0.7 05/11/2022   LDLCALC 66 08/07/2018   CREATININE 0.80 05/11/2022    No results found for: "FRUCTOSAMINE"   Allergies as of 05/18/2022       Reactions   Antivert [meclizine Hcl] Other (See Comments)   "makes me feel like my skin is falling off". 03/05/20 pt states she was placed on this a few months back and did not have a reaction   Meclizine Other (See Comments)   "makes me feel like my skin is falling off". 03/05/20 pt states she was placed on this a few months back and did not have a reaction Other reaction(s): jumping out of skin, jittery   Trazodone Hcl Other (See Comments)   Other reaction(s): nightmares   Zolpidem Other (See Comments)   Reverse affect   Zolpidem Tartrate    Other reaction(s): doing things in sleep unaware        Medication List        Accurate as of May 18, 2022  4:35 PM. If you have any questions, ask your nurse or doctor.  STOP taking these medications    Trulicity 1.61 WR/6.0AV Sopn Generic drug: Dulaglutide Stopped by: Elayne Snare, MD       TAKE these medications    Accu-Chek Guide Me w/Device Kit USE AS DIRECTED.  Dx E11.9.   Accu-Chek Guide test strip Generic drug: glucose blood CHECK BLOOD GLUCOSE IN THE MORNING AND AT BEDTIME.  E11.9 code.   buPROPion 150 MG 24 hr tablet Commonly known as: WELLBUTRIN XL Take 150 mg by mouth every morning.   CALCIUM 600 + D PO Take 1 tablet by mouth daily.   calcium carbonate 500 MG chewable tablet Commonly known as: TUMS - dosed in mg elemental calcium Chew 2 tablets by mouth daily as needed for indigestion or heartburn.   cetirizine 10 MG tablet Commonly known as: ZYRTEC Take 10 mg by mouth daily as needed for allergies.   CINNAMON PO Take 2 tablets by mouth daily.   clonazePAM 0.5 MG tablet Commonly  known as: KLONOPIN TAKE 1/2 TO 1 (ONE-HALF TO ONE) TABLET BY MOUTH AT BEDTIME AS NEEDED What changed:  how much to take how to take this when to take this reasons to take this additional instructions   diltiazem 180 MG 24 hr capsule Commonly known as: CARDIZEM CD TAKE 1 CAPSULE(180 MG) BY MOUTH DAILY   empagliflozin 10 MG Tabs tablet Commonly known as: Jardiance Take 1 tablet (10 mg total) by mouth daily with breakfast. Started by: Elayne Snare, MD   Eszopiclone 3 MG Tabs Take 1 tablet (3 mg total) by mouth at bedtime as needed. Take immediately before bedtime What changed: reasons to take this   Fish Oil 1200 MG Caps Take 1,200 mg by mouth 2 (two) times daily.   fluconazole 150 MG tablet Commonly known as: Diflucan Take 1 tablet (150 mg total) by mouth every three (3) days as needed.   Iron 325 (65 Fe) MG Tabs Take 325 mg by mouth 2 (two) times daily.   Lexapro 10 MG tablet Generic drug: escitalopram 1/2 tablet once a day x 1 week, then increase to 1 tablet daily   magnesium oxide 400 (241.3 Mg) MG tablet Commonly known as: MAG-OX Take 400 mg by mouth daily.   metFORMIN 1000 MG tablet Commonly known as: GLUCOPHAGE Take 1 tablet (1,000 mg total) by mouth 2 (two) times daily with a meal.   metoprolol tartrate 25 MG tablet Commonly known as: LOPRESSOR Take 1 tablet (25 mg total) by mouth 2 (two) times daily.   omeprazole 40 MG capsule Commonly known as: PRILOSEC Take 1 capsule by mouth once daily   repaglinide 1 MG tablet Commonly known as: PRANDIN Take 1 tablet (1 mg total) by mouth 2 (two) times daily before a meal.   rosuvastatin 20 MG tablet Commonly known as: CRESTOR Take 1 tablet (20 mg total) by mouth daily.   sertraline 50 MG tablet Commonly known as: ZOLOFT Take 50 mg by mouth daily.   spironolactone 25 MG tablet Commonly known as: ALDACTONE Take 1/2 (one-half) tablet by mouth once daily   tiZANidine 2 MG tablet Commonly known as:  ZANAFLEX Take 1-2 tablets (2-4 mg total) by mouth every 6 (six) hours as needed for muscle spasms.   tolterodine 2 MG tablet Commonly known as: DETROL Take 2 mg by mouth 2 (two) times daily.   Vitamin B12 1000 MCG Tbcr Take 1,000 mcg by mouth daily.   cyanocobalamin 1000 MCG/ML injection Commonly known as: VITAMIN B12 Inject 1,000 mcg into the muscle once a week.  vitamin C 250 MG tablet Commonly known as: ASCORBIC ACID Take 250 mg by mouth daily.   warfarin 5 MG tablet Commonly known as: COUMADIN Take as directed by the anticoagulation clinic. If you are unsure how to take this medication, talk to your nurse or doctor. Original instructions: TAKE 1/2 TO 1 TABLET BY MOUTH EVERY DAY AS DIRECTED BY CLINIC        Allergies:  Allergies  Allergen Reactions   Antivert [Meclizine Hcl] Other (See Comments)    "makes me feel like my skin is falling off".  03/05/20 pt states she was placed on this a few months back and did not have a reaction   Meclizine Other (See Comments)    "makes me feel like my skin is falling off".  03/05/20 pt states she was placed on this a few months back and did not have a reaction Other reaction(s): jumping out of skin, jittery   Trazodone Hcl Other (See Comments)    Other reaction(s): nightmares   Zolpidem Other (See Comments)    Reverse affect   Zolpidem Tartrate     Other reaction(s): doing things in sleep unaware    Past Medical History:  Diagnosis Date   Anxiety    Arthritis    "knees; right thumb" (10/14/2014)   Basal cell carcinoma    "burned off upper lip & right shoulder"   Chronic anticoagulation    CHADS VASC=3   Complication of anesthesia    after spinal anesthesia for left knee replacement, bladder did "not wake up". Was incontinent for 4 days post surgery   Depression    Fibroid tumor    Frequent diarrhea    GERD (gastroesophageal reflux disease)    High cholesterol    Hypertension    EF 65-70% grade 2DD   Insomnia     Migraine    "frequently when I was younger; maybe monthly now" (10/14/2014) - none after hysterectomy   PAF (paroxysmal atrial fibrillation) ([place]) 05/02/2018   converted with rate control   Pernicious anemia    pt not aware of this   Sleep apnea    uses a cpap   Type II diabetes mellitus ([place])    Vitamin B 12 deficiency     Past Surgical History:  Procedure Laterality Date   ABDOMINAL HYSTERECTOMY  1988   ACHILLES TENDON SURGERY Right    APPENDECTOMY  1988   BILATERAL OOPHORECTOMY  2004   CARPAL TUNNEL RELEASE Bilateral 1986   CATARACT EXTRACTION W/ INTRAOCULAR LENS  IMPLANT, BILATERAL Bilateral 2011   COLONOSCOPY     JOINT REPLACEMENT     SHOULDER ARTHROSCOPY DISTAL CLAVICLE EXCISION AND OPEN ROTATOR CUFF REPAIR Right 11/2013   TOENAIL EXCISION Right 04/2018   "big toe"   TONSILLECTOMY  1950's   TOTAL KNEE ARTHROPLASTY Left 10/13/2014   Procedure: LEFT TOTAL KNEE ARTHROPLASTY;  Surgeon: Mcarthur Rossetti, MD;  Location: [facility];  Service: Orthopedics;  Laterality: Left;   TOTAL KNEE ARTHROPLASTY Right 12/10/2018   Procedure: RIGHT TOTAL KNEE ARTHROPLASTY;  Surgeon: Mcarthur Rossetti, MD;  Location: [place];  Service: Orthopedics;  Laterality: Right;    Family History  Problem Relation Age of Onset   Arthritis Mother    Hyperlipidemia Mother    Diabetes Mother    Hypertension Mother    Arthritis Father    Hyperlipidemia Father    Heart disease Father    Diabetes Father    Stroke Brother    Hypertension Brother  Diabetes Brother    Arthritis Maternal Grandmother    Hyperlipidemia Maternal Grandmother    Diabetes Maternal Grandmother    Hypertension Maternal Grandmother    Arthritis Maternal Grandfather    Hyperlipidemia Maternal Grandfather    Hypertension Maternal Grandfather    Arthritis Paternal Grandmother    Hyperlipidemia Paternal Grandmother    Diabetes Paternal Grandmother    Hypertension Paternal Grandmother    Arthritis Paternal Grandfather     Hyperlipidemia Paternal Grandfather    Diabetes Paternal Grandfather    Hypertension Paternal Grandfather     Social History:  reports that she has never smoked. She has never used smokeless tobacco. She reports current alcohol use. She reports that she does not use drugs.  Review of Systems:  Last diabetic eye exam date 11/21  Last urine microalbumin date: 05/2022  Last foot exam date:   Symptoms of neuropathy:none  Hypertension:  none  BP Readings from Last 3 Encounters:  05/18/22 124/82  04/14/22 130/80  02/03/22 132/74    Lipid management: She is taking rosuvastatin 20 mg from her PCP    Lab Results  Component Value Date   CHOL 146 05/11/2022   CHOL 138 08/07/2018   CHOL 102 05/03/2018   Lab Results  Component Value Date   HDL 35.20 (L) 05/11/2022   HDL 46 (L) 08/07/2018   HDL 31 (L) 05/03/2018   Lab Results  Component Value Date   LDLCALC 66 08/07/2018   LDLCALC 49 05/03/2018   Lab Results  Component Value Date   TRIG 360.0 (H) 05/11/2022   TRIG 185 (H) 08/07/2018   TRIG 110 05/03/2018   Lab Results  Component Value Date   CHOLHDL 4 05/11/2022   CHOLHDL 3.0 08/07/2018   CHOLHDL 3.3 05/03/2018   Lab Results  Component Value Date   LDLDIRECT 82.0 05/11/2022   LDLDIRECT 42.0 01/31/2018   LDLDIRECT 47.0 01/10/2017   She is complaining of episodes of sweating 2-3 months without feeling hot and no other symptoms TSH normal in June 2023    Examination:   BP 124/82   Pulse 74   Ht 5' 2.5" (1.588 m)   Wt 225 lb 6.4 oz (102.2 kg)   SpO2 98%   BMI 40.57 kg/m   Body mass index is 40.57 kg/m.    ASSESSMENT/ PLAN:    Diabetes type 2 with severe obesity:   Current regimen: Metformin and repaglinide  See history of present illness for detailed discussion of current diabetes management, blood sugar patterns and problems identified  A1c is 8.5  She has difficulties with progressive hyperglycemia This is related to continued obesity as well as  difficulty with compliance with lifestyle measures including diet and exercise Had minimal diabetes education and inadequate understanding of diabetes in general as well as blood sugar targets She is only taking Prandin once a day and this may contribute to hyperglycemia after lunch and dinner including a reading of 371 after lunch  Currently with taking primarily metformin for treatment she has made for considerable improvement in her blood sugar control and medication that will also help her with weight loss Blood sugar monitoring is inadequate and she has no idea what her blood sugars are after meals or what foods make her blood sugars go up   Recommendations:  Trial of JARDIANCE 10 mg daily Discussed action of SGLT 2 drugs on lowering glucose by decreasing kidney absorption of glucose, benefits of weight loss and lower blood pressure, possible side effects including candidiasis and  dosage regimen  Discussed needing to increase her fluid intake with this Also she will let us know if this is not affordable and may consider patient assistance program She will be referred to the diabetes educator for follow-up as well as evaluation of her daily management and meal planning Urged her to start water exercises or other aerobic activity She will need to start checking blood sugars after meals more often than in the mornings and bring monitor for download Discussed blood sugar targets after meals For now she will need to take Prandin with every meal at the start of the meal and consider increasing the dose if postprandial readings continue to stay high She will let us know if her blood sugars are consistently higher after meals  LIPIDS: She has significantly high triglyceride but labs were nonfasting and with her poor diabetes control and obesity will need to follow-up fasting labs at some point LDL controlled with Crestor and followed by PCP  No microalbuminuria  Patient Instructions  Check  blood sugars on waking up 2-3 days a week  Also check blood sugars about 2 hours after meals and do this after different meals by rotation  Recommended blood sugar levels on waking up are 90-130 and about 2 hours after meal is 130-180  Please bring your blood sugar monitor to each visit, thank you  Plan meals!  ? Water exercise  Prandin at each meals   Elayne Snare 05/18/2022, 4:35 PM

## 2022-05-19 ENCOUNTER — Encounter: Payer: Self-pay | Admitting: Endocrinology

## 2022-05-19 DIAGNOSIS — E1165 Type 2 diabetes mellitus with hyperglycemia: Secondary | ICD-10-CM

## 2022-05-22 ENCOUNTER — Ambulatory Visit (INDEPENDENT_AMBULATORY_CARE_PROVIDER_SITE_OTHER): Payer: Medicare Other

## 2022-05-22 DIAGNOSIS — Z7901 Long term (current) use of anticoagulants: Secondary | ICD-10-CM | POA: Diagnosis not present

## 2022-05-22 DIAGNOSIS — I480 Paroxysmal atrial fibrillation: Secondary | ICD-10-CM | POA: Diagnosis not present

## 2022-05-22 LAB — POCT INR: INR: 1.8 — AB (ref 2.0–3.0)

## 2022-05-22 MED ORDER — ACCU-CHEK GUIDE VI STRP: ORAL_STRIP | 12 refills | Status: AC

## 2022-05-22 NOTE — Patient Instructions (Signed)
Description   SELF TESTER - Spoke with pt and advised pt to take 1.5 tablets today and then continue taking warfarin '5mg'$  daily except 2.'5mg'$  each Sunday. Stay consistent with greens (3 per week). Repeat INR in 1 week. [phone]

## 2022-05-29 ENCOUNTER — Ambulatory Visit (INDEPENDENT_AMBULATORY_CARE_PROVIDER_SITE_OTHER): Payer: Medicare Other | Admitting: Cardiology

## 2022-05-29 ENCOUNTER — Telehealth: Payer: Self-pay

## 2022-05-29 DIAGNOSIS — Z7901 Long term (current) use of anticoagulants: Secondary | ICD-10-CM

## 2022-05-29 DIAGNOSIS — Z5181 Encounter for therapeutic drug level monitoring: Secondary | ICD-10-CM | POA: Diagnosis not present

## 2022-05-29 DIAGNOSIS — I480 Paroxysmal atrial fibrillation: Secondary | ICD-10-CM | POA: Diagnosis not present

## 2022-05-29 LAB — POCT INR: INR: 2 (ref 2.0–3.0)

## 2022-05-29 NOTE — Telephone Encounter (Signed)
Lpm to check INR 

## 2022-05-29 NOTE — Telephone Encounter (Signed)
Lpmtcb and discuss INR result. °

## 2022-05-30 ENCOUNTER — Encounter: Payer: Self-pay | Admitting: Endocrinology

## 2022-05-30 NOTE — Patient Instructions (Signed)
Description   SELF TESTER - Spoke with pt and advised pt to continue taking warfarin '5mg'$  daily except 2.'5mg'$  each Sunday. Stay consistent with greens (3 per week). Repeat INR in 1 week. [phone]

## 2022-05-31 ENCOUNTER — Other Ambulatory Visit (HOSPITAL_COMMUNITY): Payer: Self-pay

## 2022-06-01 ENCOUNTER — Telehealth: Payer: Self-pay | Admitting: Pharmacy Technician

## 2022-06-01 ENCOUNTER — Other Ambulatory Visit (HOSPITAL_COMMUNITY): Payer: Self-pay

## 2022-06-01 NOTE — Telephone Encounter (Signed)
Patient Advocate Encounter   Received notification from [facility] that prior authorization for Accu-check guide test strips is required/requested.  Per Test Claim: Not covered by Part D, bill Part B  Called Pharmacy; Pt picked them up yesterday.

## 2022-06-05 ENCOUNTER — Encounter: Payer: Self-pay | Admitting: Cardiology

## 2022-06-06 ENCOUNTER — Telehealth: Payer: Self-pay

## 2022-06-06 ENCOUNTER — Institutional Professional Consult (permissible substitution): Payer: Medicare Other | Admitting: Cardiology

## 2022-06-06 LAB — POCT INR: INR: 2.8 (ref 2.0–3.0)

## 2022-06-06 NOTE — Telephone Encounter (Signed)
Lpmtcb with INR result.

## 2022-06-07 ENCOUNTER — Ambulatory Visit (INDEPENDENT_AMBULATORY_CARE_PROVIDER_SITE_OTHER): Payer: Medicare Other

## 2022-06-07 DIAGNOSIS — Z7901 Long term (current) use of anticoagulants: Secondary | ICD-10-CM | POA: Diagnosis not present

## 2022-06-07 DIAGNOSIS — I480 Paroxysmal atrial fibrillation: Secondary | ICD-10-CM | POA: Diagnosis not present

## 2022-06-07 DIAGNOSIS — F411 Generalized anxiety disorder: Secondary | ICD-10-CM | POA: Diagnosis not present

## 2022-06-07 NOTE — Patient Instructions (Signed)
Description   SELF TESTER - Spoke with pt and advised pt to continue taking warfarin '5mg'$  daily except 2.'5mg'$  each Sunday. Stay consistent with greens (3 per week). Repeat INR in 1 week. [phone]

## 2022-06-16 ENCOUNTER — Ambulatory Visit (INDEPENDENT_AMBULATORY_CARE_PROVIDER_SITE_OTHER): Payer: Medicare Other

## 2022-06-16 DIAGNOSIS — I480 Paroxysmal atrial fibrillation: Secondary | ICD-10-CM | POA: Diagnosis not present

## 2022-06-16 DIAGNOSIS — Z7901 Long term (current) use of anticoagulants: Secondary | ICD-10-CM

## 2022-06-16 LAB — POCT INR: INR: 2.4 (ref 2.0–3.0)

## 2022-06-19 ENCOUNTER — Telehealth: Payer: Self-pay

## 2022-06-19 NOTE — Telephone Encounter (Signed)
Tried calling to discuss INR result/recommendation, but mailbox is full

## 2022-06-20 ENCOUNTER — Telehealth: Payer: Self-pay

## 2022-06-20 NOTE — Patient Instructions (Signed)
Description   SELF TESTER - Spoke with pt and advised pt to continue taking warfarin '5mg'$  daily except 2.'5mg'$  each Sunday. Stay consistent with greens (3 per week). Repeat INR in 1 week. [phone]

## 2022-06-20 NOTE — Telephone Encounter (Signed)
Tried calling patient to discuss INR result/recommendation, but mailbox is full.

## 2022-06-23 LAB — POCT INR: INR: 2.8 (ref 2.0–3.0)

## 2022-06-26 ENCOUNTER — Ambulatory Visit (INDEPENDENT_AMBULATORY_CARE_PROVIDER_SITE_OTHER): Payer: Medicare Other | Admitting: *Deleted

## 2022-06-26 DIAGNOSIS — Z7901 Long term (current) use of anticoagulants: Secondary | ICD-10-CM

## 2022-06-26 DIAGNOSIS — I480 Paroxysmal atrial fibrillation: Secondary | ICD-10-CM

## 2022-06-27 NOTE — Patient Instructions (Signed)
Description   SELF TESTER - Spoke with pt and advised pt to continue taking warfarin '5mg'$  daily except 2.'5mg'$  each Sunday. Stay consistent with greens (3 per week). Repeat INR in 1 week. [phone]

## 2022-06-30 ENCOUNTER — Telehealth: Payer: Self-pay | Admitting: *Deleted

## 2022-06-30 NOTE — Telephone Encounter (Signed)
Called pt since she is due for INR; there was no answer so left a message.

## 2022-07-03 DIAGNOSIS — I480 Paroxysmal atrial fibrillation: Secondary | ICD-10-CM | POA: Diagnosis not present

## 2022-07-04 ENCOUNTER — Ambulatory Visit (INDEPENDENT_AMBULATORY_CARE_PROVIDER_SITE_OTHER): Payer: Medicare Other | Admitting: Cardiology

## 2022-07-04 DIAGNOSIS — Z5181 Encounter for therapeutic drug level monitoring: Secondary | ICD-10-CM

## 2022-07-04 DIAGNOSIS — I480 Paroxysmal atrial fibrillation: Secondary | ICD-10-CM | POA: Diagnosis not present

## 2022-07-04 DIAGNOSIS — Z7901 Long term (current) use of anticoagulants: Secondary | ICD-10-CM

## 2022-07-04 LAB — POCT INR: INR: 2.1 (ref 2.0–3.0)

## 2022-07-04 NOTE — Patient Instructions (Signed)
Description   SELF TESTER - Spoke with pt and advised pt to continue taking warfarin '5mg'$  daily except 2.'5mg'$  each Sunday. Stay consistent with greens (3 per week). Repeat INR in 1 week. [phone]

## 2022-07-09 ENCOUNTER — Encounter: Payer: Self-pay | Admitting: Endocrinology

## 2022-07-09 DIAGNOSIS — E1165 Type 2 diabetes mellitus with hyperglycemia: Secondary | ICD-10-CM

## 2022-07-10 ENCOUNTER — Ambulatory Visit: Payer: Medicare Other | Admitting: Cardiovascular Disease

## 2022-07-10 ENCOUNTER — Encounter: Payer: Self-pay | Admitting: Cardiovascular Disease

## 2022-07-11 DIAGNOSIS — L293 Anogenital pruritus, unspecified: Secondary | ICD-10-CM | POA: Diagnosis not present

## 2022-07-11 DIAGNOSIS — B3731 Acute candidiasis of vulva and vagina: Secondary | ICD-10-CM | POA: Diagnosis not present

## 2022-07-11 DIAGNOSIS — R81 Glycosuria: Secondary | ICD-10-CM | POA: Diagnosis not present

## 2022-07-12 ENCOUNTER — Encounter: Payer: Self-pay | Admitting: Endocrinology

## 2022-07-12 ENCOUNTER — Other Ambulatory Visit: Payer: Self-pay

## 2022-07-12 ENCOUNTER — Other Ambulatory Visit: Payer: Self-pay | Admitting: Cardiovascular Disease

## 2022-07-12 ENCOUNTER — Ambulatory Visit: Payer: Self-pay | Admitting: Cardiology

## 2022-07-12 ENCOUNTER — Emergency Department (HOSPITAL_BASED_OUTPATIENT_CLINIC_OR_DEPARTMENT_OTHER)
Admission: EM | Admit: 2022-07-12 | Discharge: 2022-07-12 | Disposition: A | Discharge status: Home / Self Care | Payer: Medicare Other | Attending: Emergency Medicine | Admitting: Emergency Medicine

## 2022-07-12 ENCOUNTER — Encounter: Payer: Self-pay | Admitting: Cardiovascular Disease

## 2022-07-12 ENCOUNTER — Encounter (HOSPITAL_BASED_OUTPATIENT_CLINIC_OR_DEPARTMENT_OTHER): Payer: Self-pay | Admitting: Emergency Medicine

## 2022-07-12 DIAGNOSIS — I480 Paroxysmal atrial fibrillation: Secondary | ICD-10-CM

## 2022-07-12 DIAGNOSIS — Z7984 Long term (current) use of oral hypoglycemic drugs: Secondary | ICD-10-CM | POA: Insufficient documentation

## 2022-07-12 DIAGNOSIS — Z7901 Long term (current) use of anticoagulants: Secondary | ICD-10-CM | POA: Diagnosis not present

## 2022-07-12 DIAGNOSIS — I10 Essential (primary) hypertension: Secondary | ICD-10-CM | POA: Diagnosis not present

## 2022-07-12 DIAGNOSIS — Z79899 Other long term (current) drug therapy: Secondary | ICD-10-CM | POA: Diagnosis not present

## 2022-07-12 DIAGNOSIS — R791 Abnormal coagulation profile: Secondary | ICD-10-CM | POA: Diagnosis not present

## 2022-07-12 DIAGNOSIS — R799 Abnormal finding of blood chemistry, unspecified: Secondary | ICD-10-CM | POA: Diagnosis present

## 2022-07-12 LAB — COMPREHENSIVE METABOLIC PANEL WITH GFR
ALT: 15 U/L (ref 0–44)
AST: 23 U/L (ref 15–41)
Albumin: 3.3 g/dL — ABNORMAL LOW (ref 3.5–5.0)
Alkaline Phosphatase: 52 U/L (ref 38–126)
Anion gap: 7 (ref 5–15)
BUN: 9 mg/dL (ref 8–23)
CO2: 26 mmol/L (ref 22–32)
Calcium: 8.6 mg/dL — ABNORMAL LOW (ref 8.9–10.3)
Chloride: 106 mmol/L (ref 98–111)
Creatinine, Ser: 0.7 mg/dL (ref 0.44–1.00)
GFR, Estimated: >60 mL/min
Glucose, Bld: 197 mg/dL — ABNORMAL HIGH (ref 70–99)
Potassium: 3.8 mmol/L (ref 3.5–5.1)
Sodium: 139 mmol/L (ref 135–145)
Total Bilirubin: 0.6 mg/dL (ref 0.3–1.2)
Total Protein: 6.3 g/dL — ABNORMAL LOW (ref 6.5–8.1)

## 2022-07-12 LAB — CBC WITH DIFFERENTIAL/PLATELET
Abs Immature Granulocytes: 0.02 10*3/uL (ref 0.00–0.07)
Basophils Absolute: 0 10*3/uL (ref 0.0–0.1)
Basophils Relative: 1 %
Eosinophils Absolute: 0.1 10*3/uL (ref 0.0–0.5)
Eosinophils Relative: 2 %
HCT: 44.6 % (ref 36.0–46.0)
Hemoglobin: 14.2 g/dL (ref 12.0–15.0)
Immature Granulocytes: 0 %
Lymphocytes Relative: 31 %
Lymphs Abs: 2.7 10*3/uL (ref 0.7–4.0)
MCH: 28.9 pg (ref 26.0–34.0)
MCHC: 31.8 g/dL (ref 30.0–36.0)
MCV: 90.7 fL (ref 80.0–100.0)
Monocytes Absolute: 0.7 10*3/uL (ref 0.1–1.0)
Monocytes Relative: 8 %
Neutro Abs: 5.2 10*3/uL (ref 1.7–7.7)
Neutrophils Relative %: 58 %
Platelets: 264 10*3/uL (ref 150–400)
RBC: 4.92 MIL/uL (ref 3.87–5.11)
RDW: 14.4 % (ref 11.5–15.5)
WBC: 8.8 10*3/uL (ref 4.0–10.5)
nRBC: 0 % (ref 0.0–0.2)

## 2022-07-12 LAB — PROTIME-INR
INR: >10 (ref 0.9–1.2)
INR: 6.4 (ref 0.8–1.2)
Prothrombin Time: 91.9 s — ABNORMAL HIGH (ref 9.1–12.0)
Prothrombin Time: 56.1 seconds — ABNORMAL HIGH (ref 11.4–15.2)

## 2022-07-12 LAB — POCT INR: INR: 8 — AB (ref 2.0–3.0)

## 2022-07-12 LAB — APTT: aPTT: 66 seconds — ABNORMAL HIGH (ref 24–36)

## 2022-07-12 MED ORDER — VITAMIN K1 10 MG/ML IJ SOLN
2.5000 mg | Freq: Once | INTRAVENOUS | Status: AC
  Administered 2022-07-12: 2.5 mg via INTRAVENOUS
  Filled 2022-07-12: qty 0.25

## 2022-07-12 MED ORDER — VITAMIN K1 10 MG/ML IJ SOLN
INTRAMUSCULAR | Status: AC
  Filled 2022-07-12: qty 1

## 2022-07-12 MED ORDER — REPAGLINIDE 1 MG PO TABS: 1.0000 mg | ORAL_TABLET | Freq: Two times a day (BID) | ORAL | 0 refills | Status: DC

## 2022-07-12 MED ORDER — SODIUM CHLORIDE 0.9 % IV SOLN: INTRAVENOUS | Status: DC | PRN

## 2022-07-12 NOTE — Discharge Instructions (Addendum)
INR was still elevated at 6.4 this evening.  You were given a dose of vitamin K to help bring your level back to an appropriate range.  Follow-up with your doctor to have your levels rechecked and determine when you should restart your Coumadin.

## 2022-07-12 NOTE — ED Provider Notes (Signed)
[facility] EMERGENCY DEPARTMENT Provider Note   CSN: [id number] Arrival date & time: 07/12/22  1748     History  Chief Complaint  Patient presents with   Abnormal Lab    Debra Wright is a 71 y.o. female.   Abnormal Lab     patient has a history of hypertension fibrillation cardiovascular disease on chronic Coumadin.  Patient states she checked her INR at home and it was elevated.  She went to her doctor's offices today to have it rechecked.  It was also elevated at greater than 10.  Patient denies feeling poorly.  She is not having any bleeding.  She does not feel weak or ill.  Patient was instructed to come to the ED to have a vitamin K shot.  Home Medications Prior to Admission medications   Medication Sig Start Date End Date Taking? Authorizing Provider  Blood Glucose Monitoring Suppl (ACCU-CHEK GUIDE ME) w/Device KIT USE AS DIRECTED.  Dx E11.9. 11/10/20   Hilts, Legrand Como, MD  buPROPion (WELLBUTRIN XL) 150 MG 24 hr tablet Take 150 mg by mouth every morning. 09/06/21   [provider]  calcium carbonate (TUMS - DOSED IN MG ELEMENTAL CALCIUM) 500 MG chewable tablet Chew 2 tablets by mouth daily as needed for indigestion or heartburn.    [provider]  Calcium Carbonate-Vitamin D (CALCIUM 600 + D PO) Take 1 tablet by mouth daily.    [provider]  cetirizine (ZYRTEC) 10 MG tablet Take 10 mg by mouth daily as needed for allergies.    [provider]  CINNAMON PO Take 2 tablets by mouth daily.    [provider]  clonazePAM (KLONOPIN) 0.5 MG tablet TAKE 1/2 TO 1 (ONE-HALF TO ONE) TABLET BY MOUTH AT BEDTIME AS NEEDED Patient taking differently: Take 0.25-0.5 mg by mouth daily as needed for anxiety. 11/30/20   Hilts, Legrand Como, MD  cyanocobalamin (,VITAMIN B-12,) 1000 MCG/ML injection Inject 1,000 mcg into the muscle once a week. 03/31/22   [provider]  Cyanocobalamin (VITAMIN B12) 1000 MCG TBCR Take 1,000 mcg by mouth  daily.    [provider]  diltiazem (CARDIZEM CD) 180 MG 24 hr capsule TAKE 1 CAPSULE(180 MG) BY MOUTH DAILY 05/02/22   Troy Sine, MD  empagliflozin (JARDIANCE) 10 MG TABS tablet Take 1 tablet (10 mg total) by mouth daily with breakfast. 05/18/22   Elayne Snare, MD  escitalopram (LEXAPRO) 10 MG tablet 1/2 tablet once a day x 1 week, then increase to 1 tablet daily 05/10/22   [provider]  Eszopiclone 3 MG TABS Take 1 tablet (3 mg total) by mouth at bedtime as needed. Take immediately before bedtime Patient taking differently: Take 3 mg by mouth at bedtime as needed (sleep). Take immediately before bedtime 11/09/20   Hilts, Michael, MD  Ferrous Sulfate (IRON) 325 (65 Fe) MG TABS Take 325 mg by mouth 2 (two) times daily.    [provider]  fluconazole (DIFLUCAN) 150 MG tablet Take 1 tablet (150 mg total) by mouth every three (3) days as needed. 11/24/20   Hilts, Legrand Como, MD  glucose blood (ACCU-CHEK GUIDE) test strip CHECK BLOOD GLUCOSE IN THE MORNING AND AT BEDTIME.  E11.9 code. 05/22/22   Elayne Snare, MD  magnesium oxide (MAG-OX) 400 (241.3 Mg) MG tablet Take 400 mg by mouth daily.    [provider]  metFORMIN (GLUCOPHAGE) 1000 MG tablet Take 1 tablet (1,000 mg total) by mouth 2 (two) times daily with a  meal. 03/10/22   Elayne Snare, MD  metoprolol tartrate (LOPRESSOR) 25 MG tablet Take 1 tablet (25 mg total) by mouth 2 (two) times daily. 02/03/22   [name], NP  Omega-3 Fatty Acids (FISH OIL) 1200 MG CAPS Take 1,200 mg by mouth 2 (two) times daily.    [provider]  omeprazole (PRILOSEC) 40 MG capsule Take 1 capsule by mouth once daily 12/09/20   Hilts, Legrand Como, MD  repaglinide (PRANDIN) 1 MG tablet Take 1 tablet (1 mg total) by mouth 2 (two) times daily before a meal. 07/12/22   Elayne Snare, MD  rosuvastatin (CRESTOR) 20 MG tablet Take 1 tablet (20 mg total) by mouth daily. 01/03/21 10/05/21  Troy Sine, MD  sertraline (ZOLOFT) 50 MG tablet Take  50 mg by mouth daily. 03/31/22   [provider]  spironolactone (ALDACTONE) 25 MG tablet Take 1/2 (one-half) tablet by mouth once daily 07/21/21   Troy Sine, MD  tiZANidine (ZANAFLEX) 2 MG tablet Take 1-2 tablets (2-4 mg total) by mouth every 6 (six) hours as needed for muscle spasms. 03/17/20   Hilts, Legrand Como, MD  tolterodine (DETROL) 2 MG tablet Take 2 mg by mouth 2 (two) times daily. 08/15/21   [provider]  vitamin C (ASCORBIC ACID) 250 MG tablet Take 250 mg by mouth daily.    [provider]  warfarin (COUMADIN) 5 MG tablet TAKE 1/2 TO 1 TABLET BY MOUTH EVERY DAY AS DIRECTED BY CLINIC 01/06/22   Troy Sine, MD      Allergies    Antivert [meclizine hcl], Meclizine, Trazodone hcl, Zolpidem, and Zolpidem tartrate    Review of Systems   Review of Systems  Physical Exam Updated Vital Signs BP (!) 105/59 (BP Location: Left Arm)   Pulse 68   Temp 98.3 F (36.8 C) (Oral)   Resp 16   Ht 1.588 m (5' 2.5")   Wt 103.9 kg   SpO2 97%   BMI 41.22 kg/m  Physical Exam Vitals and nursing note reviewed.  Constitutional:      General: She is not in acute distress.    Appearance: She is well-developed.  HENT:     Head: Normocephalic and atraumatic.     Right Ear: External ear normal.     Left Ear: External ear normal.  Eyes:     General: No scleral icterus.       Right eye: No discharge.        Left eye: No discharge.     Conjunctiva/sclera: Conjunctivae normal.  Neck:     Trachea: No tracheal deviation.  Cardiovascular:     Rate and Rhythm: Normal rate.  Pulmonary:     Effort: Pulmonary effort is normal. No respiratory distress.     Breath sounds: No stridor.  Abdominal:     General: There is no distension.  Musculoskeletal:        General: No swelling or deformity.     Cervical back: Neck supple.  Skin:    General: Skin is warm and dry.     Findings: No rash.  Neurological:     Mental Status: She is alert.     Cranial Nerves: Cranial  nerve deficit: no gross deficits.     ED Results / Procedures / Treatments   Labs (all labs ordered are listed, but only abnormal results are displayed) Labs Reviewed  COMPREHENSIVE METABOLIC PANEL - Abnormal; Notable for the following components:      Result Value  Glucose, Bld 197 (*)    Calcium 8.6 (*)    Total Protein 6.3 (*)    Albumin 3.3 (*)    All other components within normal limits  APTT - Abnormal; Notable for the following components:   aPTT 66 (*)    All other components within normal limits  PROTIME-INR - Abnormal; Notable for the following components:   Prothrombin Time 56.1 (*)    INR 6.4 (*)    All other components within normal limits  CBC WITH DIFFERENTIAL/PLATELET    EKG None  Radiology No results found.  Procedures Procedures    Medications Ordered in ED Medications  phytonadione (VITAMIN K) 2.5 mg in dextrose 5 % 50 mL IVPB (2.5 mg Intravenous New Bag/Given 07/12/22 1945)  phytonadione (VITAMIN K) 10 MG/ML SQ injection (has no administration in time range)  0.9 %  sodium chloride infusion ( Intravenous New Bag/Given 07/12/22 1944)    ED Course/ Medical Decision Making/ A&P Clinical Course as of 07/12/22 2044  Wed Jul 12, 2022  1950 Protime-INR(!!) INR elevated at 6.4 [JK]  1950 CBC with Differential CBC normal [JK]  1950 Comprehensive metabolic panel(!) Metabolic panel normal [JK]    Clinical Course User Index [JK] Dorie Rank, MD                           Medical Decision Making Problems Addressed: Elevated INR: acute illness or injury that poses a threat to life or bodily functions  Amount and/or Complexity of Data Reviewed Labs: ordered. Decision-making details documented in ED Course.  Risk Prescription drug management.   Patient presented to the ED for evaluation of an elevated INR.  She is on Coumadin for atrial fibrillation.  Outpatient labs reviewed and her INR was greater than 10 at 3 PM today.  INR is repeated and if  still elevated 6.4.  Patient was given a dose of vitamin K.  She otherwise feels well.  She is not having any signs of bleeding.  We will have her follow-up with her doctor to determine when she can resume her Coumadin.  Evaluation and diagnostic testing in the emergency department does not suggest an emergent condition requiring admission or immediate intervention beyond what has been performed at this time.  The patient is safe for discharge and has been instructed to return immediately for worsening symptoms, change in symptoms or any other concerns.        Final Clinical Impression(s) / ED Diagnoses Final diagnoses:  Elevated INR    Rx / DC Orders ED Discharge Orders     None         Dorie Rank, MD 07/12/22 2044

## 2022-07-12 NOTE — ED Triage Notes (Signed)
Pt reports checking her INR at home which show > 18. PCP recommended pt come be evaluated and to receive a vitamin K injection. Pt denies sx. Pt states she has been on Warfarin for "years" with no issues.

## 2022-07-13 ENCOUNTER — Ambulatory Visit (INDEPENDENT_AMBULATORY_CARE_PROVIDER_SITE_OTHER): Payer: Medicare Other | Admitting: *Deleted

## 2022-07-13 ENCOUNTER — Telehealth: Payer: Self-pay

## 2022-07-13 DIAGNOSIS — Z7901 Long term (current) use of anticoagulants: Secondary | ICD-10-CM

## 2022-07-13 DIAGNOSIS — I480 Paroxysmal atrial fibrillation: Secondary | ICD-10-CM | POA: Diagnosis not present

## 2022-07-13 LAB — POCT INR: INR: 2.7 (ref 2.0–3.0)

## 2022-07-13 NOTE — Telephone Encounter (Signed)
Spoke with pt and advised on doseage. Please refer to Anticoagulation Encounter from today.

## 2022-07-13 NOTE — Telephone Encounter (Signed)
Pt went to ER yesterday for Vitamin K due to supratherapeutic INR. Called pt to instruct to re-check INR today. No answer, left message on voicemail.

## 2022-07-13 NOTE — Telephone Encounter (Signed)
Prescription refill request received for warfarin Lov: 04/14/22 [facility])  Next INR check: 07/13/22 Warfarin tablet strength: '5mg'$   Appropriate dose and refill sent to requested pharmacy.

## 2022-07-14 ENCOUNTER — Other Ambulatory Visit: Payer: Self-pay | Admitting: Cardiovascular Disease

## 2022-07-17 ENCOUNTER — Telehealth: Payer: Self-pay

## 2022-07-17 ENCOUNTER — Ambulatory Visit (INDEPENDENT_AMBULATORY_CARE_PROVIDER_SITE_OTHER): Payer: Medicare Other | Admitting: *Deleted

## 2022-07-17 DIAGNOSIS — I480 Paroxysmal atrial fibrillation: Secondary | ICD-10-CM

## 2022-07-17 DIAGNOSIS — Z7901 Long term (current) use of anticoagulants: Secondary | ICD-10-CM

## 2022-07-17 LAB — POCT INR: INR: 2.1 (ref 2.0–3.0)

## 2022-07-17 NOTE — Patient Instructions (Signed)
Description   Spoke with patient advised to continue taking warfarin '5mg'$  daily except 2.'5mg'$  each Sunday. Recheck INR on Friday-post vit k & awaiting supplies. Stay consistent with greens (3 per week).  [phone]

## 2022-07-17 NOTE — Telephone Encounter (Signed)
Lpm to check INR 

## 2022-07-20 LAB — POCT INR: INR: 2.3 (ref 2.0–3.0)

## 2022-07-21 ENCOUNTER — Ambulatory Visit (INDEPENDENT_AMBULATORY_CARE_PROVIDER_SITE_OTHER): Payer: Medicare Other | Admitting: *Deleted

## 2022-07-21 ENCOUNTER — Encounter: Payer: Self-pay | Admitting: Endocrinology

## 2022-07-21 ENCOUNTER — Ambulatory Visit: Payer: Medicare Other

## 2022-07-21 DIAGNOSIS — Z7901 Long term (current) use of anticoagulants: Secondary | ICD-10-CM | POA: Diagnosis not present

## 2022-07-21 DIAGNOSIS — I480 Paroxysmal atrial fibrillation: Secondary | ICD-10-CM

## 2022-07-24 ENCOUNTER — Other Ambulatory Visit: Payer: PRIVATE HEALTH INSURANCE

## 2022-07-27 ENCOUNTER — Ambulatory Visit: Payer: PRIVATE HEALTH INSURANCE | Admitting: Endocrinology

## 2022-07-28 ENCOUNTER — Ambulatory Visit (INDEPENDENT_AMBULATORY_CARE_PROVIDER_SITE_OTHER): Payer: Medicare Other | Admitting: *Deleted

## 2022-07-28 DIAGNOSIS — I480 Paroxysmal atrial fibrillation: Secondary | ICD-10-CM

## 2022-07-28 DIAGNOSIS — Z7901 Long term (current) use of anticoagulants: Secondary | ICD-10-CM | POA: Diagnosis not present

## 2022-07-28 LAB — POCT INR: INR: 6.7 — AB (ref 2.0–3.0)

## 2022-07-28 NOTE — Patient Instructions (Addendum)
Description   Spoke with patient advised to not take any warfarin today, no warfarin Saturday, and no warfarin Sunday then recheck INR on Monday.  Normal dose: warfarin '5mg'$  daily except 2.'5mg'$  each Sunday. Stay consistent with greens (3 per week).  [phone]

## 2022-07-31 ENCOUNTER — Ambulatory Visit (INDEPENDENT_AMBULATORY_CARE_PROVIDER_SITE_OTHER): Payer: Medicare Other | Admitting: *Deleted

## 2022-07-31 DIAGNOSIS — I480 Paroxysmal atrial fibrillation: Secondary | ICD-10-CM

## 2022-07-31 DIAGNOSIS — Z7901 Long term (current) use of anticoagulants: Secondary | ICD-10-CM

## 2022-07-31 LAB — POCT INR: INR: 2 (ref 2.0–3.0)

## 2022-07-31 NOTE — Patient Instructions (Signed)
Description   Spoke with patient advised to start taking warfarin 1 tablet ('5mg'$ ) daily except 1/2 tablet (2.'5mg'$ ) each Sunday and Thursday. Stay consistent with greens (3 per week).  [phone]

## 2022-08-02 ENCOUNTER — Encounter: Payer: Self-pay | Admitting: Cardiology

## 2022-08-04 ENCOUNTER — Encounter: Payer: Medicare Other | Attending: Endocrinology | Admitting: Dietician

## 2022-08-04 VITALS — Ht 62.0 in | Wt 235.0 lb

## 2022-08-04 DIAGNOSIS — E118 Type 2 diabetes mellitus with unspecified complications: Secondary | ICD-10-CM | POA: Diagnosis not present

## 2022-08-04 NOTE — Patient Instructions (Addendum)
Consider Arm Chair Exercises-  Sit and be Fit on YouTube  Low sodium Boar's head [place], leftover meat or low sodium cheese for a sandwich rather than ham as ham is high sodium. Vegetables with lunch Consider trying Steel Cut oats ([employer] has a 5-minute Financial planner) (add walnuts and berries)  Mindfulness:  Consistently scheduled meal - avoid skipping  Choices  Eat slowly  Away from distraction (sitting in kitchen or dining room)  Stop eating when satisfied  Before a snack ask, "Am I hungry or eating for another reason?"   "What can I do instead if I am not hungry?"  Try to find something every day that brings you joy!  If I am stressed I will read or go out rather than eat.

## 2022-08-04 NOTE — Progress Notes (Signed)
Diabetes Self-Management Education  Visit Type: First/Initial  Appt. Start Time: 1445 Appt. End Time: 1600  08/04/2022  [name], identified by name and date of birth, is a 71 y.o. female with a diagnosis of Diabetes: Type 2.   ASSESSMENT Patient is here today alone.  Patient of Dr. Dwyane Dee.  She was last seen by myself in 2021 She would like to learn how to better control her blood glucose.  History includes:  Type 2 Diabetes, HTN, HLD, OSA-on cpap depression She reports some problems with memory and word finding. Medications include:  coumadin, Jardiance, Metformin, repaglinide, vitamin  Labs noted 8.5% 05/11/2022, GFR >60, Triglycerides 360 07/2022  235 lbs 08/04/2022  Patient lives with her husband.  She does the shopping and cooking.  Her husband is disabled with spinal stenosis. She is a retired Therapist, sports.  Worked with patient on [facility]. Her skin is broken and bruised due to her blood thinner. Walks with a cane. Unsteady balance.  Height '5\' 2"'$  (1.575 m), weight 235 lb (106.6 kg). Body mass index is 42.98 kg/m.   Diabetes Self-Management Education - 08/04/22 1520       Visit Information   Visit Type First/Initial      Initial Visit   Diabetes Type Type 2    Date Diagnosed 2015    Are you currently following a meal plan? No    Are you taking your medications as prescribed? Yes      Health Coping   How would you rate your overall health? Fair      Psychosocial Assessment   Patient Belief/Attitude about Diabetes Motivated to manage diabetes    What is the hardest part about your diabetes right now, causing you the most concern, or is the most worrisome to you about your diabetes?   Making healty food and beverage choices    Self-care barriers Unsteady gait/risk for falls    Self-management support Doctor's office    Other persons present Patient    Special Needs None    Preferred Learning Style No preference indicated    Learning Readiness Ready    How often do you  need to have someone help you when you read instructions, pamphlets, or other written materials from your doctor or pharmacy? 1 - Never    What is the last grade level you completed in school? College      Pre-Education Assessment   Patient understands the diabetes disease and treatment process. Needs Review    Patient understands incorporating nutritional management into lifestyle. Needs Review    Patient undertands incorporating physical activity into lifestyle. Needs Review    Patient understands using medications safely. Needs Review    Patient understands monitoring blood glucose, interpreting and using results Needs Review    Patient understands prevention, detection, and treatment of acute complications. Needs Review    Patient understands prevention, detection, and treatment of chronic complications. Needs Review    Patient understands how to develop strategies to address psychosocial issues. Needs Review    Patient understands how to develop strategies to promote health/change behavior. Needs Review      Complications   Last HgB A1C per patient/outside source 8.5 %   8.5 05/11/2022   How often do you check your blood sugar? 1-2 times/day   126-154   Fasting Blood glucose range (mg/dL) 70-129;130-179    Postprandial Blood glucose range (mg/dL) 130-179;180-200    Number of hypoglycemic episodes per month 0    Number of hyperglycemic episodes ( >  $'200mg'c$ /dL): Rare    Can you tell when your blood sugar is high? No    Have you had a dilated eye exam in the past 12 months? Yes    Have you had a dental exam in the past 12 months? Yes    Are you checking your feet? Yes    How many days per week are you checking your feet? 7      Dietary Intake   Breakfast cereal (rice chex), 2% milk, blueberries, 1/2 banana, coffee with sweet and low and dried creamer   9 am   Snack (morning) none    Lunch ham or pimento cheese sandwich on [employer], prezels   3-4 pm   Snack (afternoon) pita thins, occasional  hummus or guacamole    Dinner broiled hamburger (85/15), green beans, baked potato, butter, sugar free pudding or sugar free jello    Snack (evening) none    Beverage(s) water, coffee with sweet and low and powdered creamer, unsweetened tea, occasional sprite zero      Activity / Exercise   Activity / Exercise Type ADL's      Patient Education   Previous Diabetes Education Yes (please comment)   2021   Disease Pathophysiology Other (comment)   insulin resistance   Healthy Eating Role of diet in the treatment of diabetes and the relationship between the three main macronutrients and blood glucose level;Plate Method;Meal options for control of blood glucose level and chronic complications.;Other (comment)   low sodium   Being Active Role of exercise on diabetes management, blood pressure control and cardiac health.;Helped patient identify appropriate exercises in relation to his/her diabetes, diabetes complications and other health issue.    Medications Reviewed patients medication for diabetes, action, purpose, timing of dose and side effects.    Monitoring Taught/discussed recording of test results and interpretation of SMBG.    Diabetes Stress and Support Identified and addressed patients feelings and concerns about diabetes;Role of stress on diabetes      Individualized Goals (developed by patient)   Nutrition General guidelines for healthy choices and portions discussed    Physical Activity Exercise 3-5 times per week;15 minutes per day    Medications take my medication as prescribed    Problem Solving Eating Pattern;Addressing barriers to behavior change    Reducing Risk examine blood glucose patterns;do foot checks daily      Post-Education Assessment   Patient understands the diabetes disease and treatment process. Comprehends key points    Patient understands incorporating nutritional management into lifestyle. Comprehends key points    Patient undertands incorporating physical  activity into lifestyle. Comprehends key points    Patient understands using medications safely. Demonstrates understanding / competency    Patient understands monitoring blood glucose, interpreting and using results Demonstrates understanding / competency    Patient understands prevention, detection, and treatment of acute complications. Demonstrates understanding / competency    Patient understands prevention, detection, and treatment of chronic complications. Demonstrates understanding / competency    Patient understands how to develop strategies to address psychosocial issues. Demonstrates understanding / competency    Patient understands how to develop strategies to promote health/change behavior. Demonstrates understanding / competency      Outcomes   Expected Outcomes Demonstrated interest in learning but significant barriers to change    Future DMSE 3-4 months    Program Status Not Completed             Individualized Plan for Diabetes Self-Management Training:   Learning Objective:  Patient will have a greater understanding of diabetes self-management. Patient education plan is to attend individual and/or group sessions per assessed needs and concerns.   Plan:   Patient Instructions  Consider Arm Chair Exercises-  Sit and be Fit on YouTube  Low sodium Boar's head [place], leftover meat or low sodium cheese for a sandwich rather than ham as ham is high sodium. Vegetables with lunch Consider trying Steel Cut oats ([employer] has a 5-minute Financial planner) (add walnuts and berries)  Mindfulness:  Consistently scheduled meal - avoid skipping  Choices  Eat slowly  Away from distraction (sitting in kitchen or dining room)  Stop eating when satisfied  Before a snack ask, "Am I hungry or eating for another reason?"   "What can I do instead if I am not hungry?"  Try to find something every day that brings you joy!  If I am stressed I will read or go out rather than eat.      Expected Outcomes:  Demonstrated interest in learning but significant barriers to change  Education material provided: My Plate  If problems or questions, patient to contact team via:  Phone  Future DSME appointment: 3-4 months

## 2022-08-07 ENCOUNTER — Telehealth: Payer: Self-pay

## 2022-08-07 NOTE — Telephone Encounter (Signed)
Lpm to check INR 

## 2022-08-08 ENCOUNTER — Encounter: Payer: Self-pay | Admitting: Cardiovascular Disease

## 2022-08-08 ENCOUNTER — Other Ambulatory Visit: Payer: Self-pay | Admitting: *Deleted

## 2022-08-08 ENCOUNTER — Telehealth: Payer: Self-pay | Admitting: Cardiovascular Disease

## 2022-08-08 NOTE — Telephone Encounter (Signed)
Patient with diagnosis of afib on warfarin for anticoagulation.    Procedure:  EPIDURAL STEROID INJECTION  Date of procedure: TBD   CHA2DS2-VASc Score = 5   This indicates a 7.2% annual risk of stroke. The patient's score is based upon: CHF History: 1 HTN History: 1 Diabetes History: 1 Stroke History: 0 Vascular Disease History: 0 Age Score: 1 Gender Score: 1      CrCl 82 ml/min  Per office protocol, patient can hold warfarin for 5 days prior to procedure.    Patient will NOT need bridging with Lovenox (enoxaparin) around procedure.  **This guidance is not considered finalized until pre-operative APP has relayed final recommendations.**

## 2022-08-08 NOTE — Telephone Encounter (Signed)
   Pre-operative Risk Assessment    Patient Name: Debra Wright  DOB: 1951-06-04 MRN: [id number]      Request for Surgical Clearance    Procedure:   EPIDURAL STEROID INJECTION  Date of Surgery:  Clearance TBD                                 Surgeon:  DR. DAVE [facility] Surgeon's Group or Practice Name:  [facility] Phone number:  [phone] Fax number:  [phone]   Type of Clearance Requested:   - Medical  - Pharmacy:  Hold Warfarin (Coumadin) x 5 DAYS PRIOR   Type of Anesthesia:  Not Indicated   Additional requests/questions:    Jiles Prows   08/08/2022, 12:48 PM

## 2022-08-08 NOTE — Telephone Encounter (Signed)
Followe Up:     Patient is calling,concerning her clearance. She said the doctor have not received the clearnace. She said today is when she have it.

## 2022-08-08 NOTE — Telephone Encounter (Signed)
Pt called an stated that she has been holding her warfarin for 5 days for her injection that was supposed to happen today but didn't cause her clearance has not been sent back. Then she states that she has never needed a clearance for each injection and one clearance is good for a year.   Advised pt that a clearance is needed for each procedure. She stated well she has never done that and has had many injections and that someone is supposed to be faxing something back to Dr. Davy Pique. Then she states they are planning for 08/29/22 for the steroid injection procedure. She states she will resume her warfarin today since she did not have the procedure. Advised since she has been off the warfarin then to take an extra 1/2 tablet of warfarin today and tomorrow then resume normal dose and check INR on Monday. She verbalized understanding and will call on Monday.

## 2022-08-08 NOTE — Telephone Encounter (Signed)
I s/w the pt and stated that I do not see a clearance request in the system yet. She tells me that she s/w someone in our office today who said they are faxing the clearance now to Dr. Davy Pique. Pt states that was about 1 hour ago. I however, do not see any input in the chart for pre op clearance. I called Dr. Dionne Ano office and left a message to please ASAP clearance request to [phone] attn: pre op team. Once I have the information I will forward to pre op provider for review. Pt states she needs to get the injection as she is in a lot of pain.

## 2022-08-08 NOTE — Telephone Encounter (Signed)
See phone note

## 2022-08-09 ENCOUNTER — Telehealth: Payer: Self-pay | Admitting: *Deleted

## 2022-08-09 NOTE — Telephone Encounter (Signed)
   Name: Debra Wright  DOB: September 04, 1951  MRN: [id number]  Primary Cardiologist: Shelva Majestic, MD   Preoperative team, please contact this patient and set up a phone call appointment for further preoperative risk assessment. Please obtain consent and complete medication review. Thank you for your help.  I confirm that guidance regarding antiplatelet and oral anticoagulation therapy has been completed and, if necessary, noted below.  Patient with diagnosis of afib on warfarin for anticoagulation.     Procedure:  EPIDURAL STEROID INJECTION  Date of procedure: TBD     CHA2DS2-VASc Score = 5   This indicates a 7.2% annual risk of stroke. The patient's score is based upon: CHF History: 1 HTN History: 1 Diabetes History: 1 Stroke History: 0 Vascular Disease History: 0 Age Score: 1 Gender Score: 1       CrCl 82 ml/min   Per office protocol, patient can hold warfarin for 5 days prior to procedure.     Patient will NOT need bridging with Lovenox (enoxaparin) around procedure.     [name], NP 08/09/2022, 6:37 AM [address]

## 2022-08-09 NOTE — Telephone Encounter (Signed)
Pt agreeable to tele pre op appt 08/10/22 @ 10:40. Pt is needing to get her injection. This has been delayed as we did not have the clearance request, see notes. Pt states she is in pain and now her procedure has been moved out to 08/29/22 however, if we can get her cleared before then, Dr. Davy Pique office may be able to move appt up sooner. Pt asked for appt ASAP please.   Med rec and consent are done.     Patient Consent for Virtual Visit        Debra Wright has provided verbal consent on 08/09/2022 for a virtual visit (video or telephone).   CONSENT FOR VIRTUAL VISIT FOR:  Debra Wright  By participating in this virtual visit I agree to the following:  I hereby voluntarily request, consent and authorize [facility] and its employed or contracted physicians, physician assistants, nurse practitioners or other licensed health care professionals (the Practitioner), to provide me with telemedicine health care services (the "Services") as deemed necessary by the treating Practitioner. I acknowledge and consent to receive the Services by the Practitioner via telemedicine. I understand that the telemedicine visit will involve communicating with the Practitioner through live audiovisual communication technology and the disclosure of certain medical information by electronic transmission. I acknowledge that I have been given the opportunity to request an in-person assessment or other available alternative prior to the telemedicine visit and am voluntarily participating in the telemedicine visit.  I understand that I have the right to withhold or withdraw my consent to the use of telemedicine in the course of my care at any time, without affecting my right to future care or treatment, and that the Practitioner or I may terminate the telemedicine visit at any time. I understand that I have the right to inspect all information obtained and/or recorded in the course of the telemedicine visit and  may receive copies of available information for a reasonable fee.  I understand that some of the potential risks of receiving the Services via telemedicine include:  Delay or interruption in medical evaluation due to technological equipment failure or disruption; Information transmitted may not be sufficient (e.g. poor resolution of images) to allow for appropriate medical decision making by the Practitioner; and/or  In rare instances, security protocols could fail, causing a breach of personal health information.  Furthermore, I acknowledge that it is my responsibility to provide information about my medical history, conditions and care that is complete and accurate to the best of my ability. I acknowledge that Practitioner's advice, recommendations, and/or decision may be based on factors not within their control, such as incomplete or inaccurate data provided by me or distortions of diagnostic images or specimens that may result from electronic transmissions. I understand that the practice of medicine is not an exact science and that Practitioner makes no warranties or guarantees regarding treatment outcomes. I acknowledge that a copy of this consent can be made available to me via my patient portal ([facility]), or I can request a printed copy by calling the office of [facility].    I understand that my insurance will be billed for this visit.   I have read or had this consent read to me. I understand the contents of this consent, which adequately explains the benefits and risks of the Services being provided via telemedicine.  I have been provided ample opportunity to ask questions regarding this consent and the Services and have had my questions  answered to my satisfaction. I give my informed consent for the services to be provided through the use of telemedicine in my medical care

## 2022-08-09 NOTE — Progress Notes (Unsigned)
Virtual Visit via Telephone Note   Because of Debra Wright's co-morbid illnesses, she is at least at moderate risk for complications without adequate follow up.  This format is felt to be most appropriate for this patient at this time.  The patient did not have access to video technology/had technical difficulties with video requiring transitioning to audio format only (telephone).  All issues noted in this document were discussed and addressed.  No physical exam could be performed with this format.  Please refer to the patient's chart for her consent to telehealth for [facility].  Evaluation Performed:  Preoperative cardiovascular risk assessment _____________   Date:  08/10/2022   Patient ID:  Debra Wright, DOB Dec 06, 1950, MRN [id number] Patient Location:  Home Provider location:   Office  Primary Care Provider:  [name], PA Primary Cardiologist:  Shelva Majestic, MD  Chief Complaint / Patient Profile   71 y.o. y/o female with a h/o HTN, type 2 diabetes, PAF, OSA, insomnia, Grade 2 diastiolic dysfunction seen on echo 11/2021, atrial fibrillation with RVR following steroid injection  to right knee in 2019 who is pending ESI and presents today for telephonic preoperative cardiovascular risk assessment.  Past Medical History    Past Medical History:  Diagnosis Date   Anxiety    Arthritis    "knees; right thumb" (10/14/2014)   Basal cell carcinoma    "burned off upper lip & right shoulder"   Chronic anticoagulation    CHADS VASC=3   Complication of anesthesia    after spinal anesthesia for left knee replacement, bladder did "not wake up". Was incontinent for 4 days post surgery   Depression    Fibroid tumor    Frequent diarrhea    GERD (gastroesophageal reflux disease)    High cholesterol    Hypertension    EF 65-70% grade 2DD   Insomnia    Migraine    "frequently when I was younger; maybe monthly now" (10/14/2014) - none after hysterectomy   PAF  (paroxysmal atrial fibrillation) (Debra Wright) 05/02/2018   converted with rate control   Pernicious anemia    pt not aware of this   Sleep apnea    uses a cpap   Type II diabetes mellitus ([place])    Vitamin B 12 deficiency    Past Surgical History:  Procedure Laterality Date   ABDOMINAL HYSTERECTOMY  1988   ACHILLES TENDON SURGERY Right    APPENDECTOMY  1988   BILATERAL OOPHORECTOMY  2004   CARPAL TUNNEL RELEASE Bilateral 1986   CATARACT EXTRACTION W/ INTRAOCULAR LENS  IMPLANT, BILATERAL Bilateral 2011   COLONOSCOPY     JOINT REPLACEMENT     SHOULDER ARTHROSCOPY DISTAL CLAVICLE EXCISION AND OPEN ROTATOR CUFF REPAIR Right 11/2013   TOENAIL EXCISION Right 04/2018   "big toe"   TONSILLECTOMY  1950's   TOTAL KNEE ARTHROPLASTY Left 10/13/2014   Procedure: LEFT TOTAL KNEE ARTHROPLASTY;  Surgeon: Mcarthur Rossetti, MD;  Location: [facility];  Service: Orthopedics;  Laterality: Left;   TOTAL KNEE ARTHROPLASTY Right 12/10/2018   Procedure: RIGHT TOTAL KNEE ARTHROPLASTY;  Surgeon: Mcarthur Rossetti, MD;  Location: [facility];  Service: Orthopedics;  Laterality: Right;    Allergies  Allergies  Allergen Reactions   Antivert [Meclizine Hcl] Other (See Comments)    "makes me feel like my skin is falling off".  03/05/20 pt states she was placed on this a few months back and did not have a reaction   Meclizine Other (  See Comments)    "makes me feel like my skin is falling off".  03/05/20 pt states she was placed on this a few months back and did not have a reaction Other reaction(s): jumping out of skin, jittery   Trazodone Hcl Other (See Comments)    Other reaction(s): nightmares   Zolpidem Other (See Comments)    Reverse affect   Zolpidem Tartrate     Other reaction(s): doing things in sleep unaware    History of Present Illness    Debra Wright is a 71 y.o. female who presents via audio/video conferencing for a telehealth visit today.  Pt was last seen in cardiology clinic on 04/14/2022 by  Almyra Deforest, PA.  At that time Debra Wright was doing well.  The patient is now pending procedure as outlined above. Since her last visit, she denies chest pain, shortness of breath, lower extremity edema, fatigue, palpitations, melena, hematuria, hemoptysis, diaphoresis, weakness, presyncope, syncope, orthopnea, and PND. She is limited by back pain but remains active and is able to achieve > 4 METS activity doing house work such as vacuuming, mopping, assisting her disabled husband with ADLs.   Home Medications    Prior to Admission medications   Medication Sig Start Date End Date Taking? Authorizing Provider  Blood Glucose Monitoring Suppl (ACCU-CHEK GUIDE ME) w/Device KIT USE AS DIRECTED.  Dx E11.9. 11/10/20   Hilts, Legrand Como, MD  buPROPion (WELLBUTRIN XL) 150 MG 24 hr tablet Take 150 mg by mouth every morning. Patient not taking: Reported on 08/04/2022 09/06/21   [provider]  calcium carbonate (TUMS - DOSED IN MG ELEMENTAL CALCIUM) 500 MG chewable tablet Chew 2 tablets by mouth daily as needed for indigestion or heartburn.    [provider]  Calcium Carbonate-Vitamin D (CALCIUM 600 + D PO) Take 1 tablet by mouth daily.    [provider]  cetirizine (ZYRTEC) 10 MG tablet Take 10 mg by mouth daily as needed for allergies.    [provider]  CINNAMON PO Take 2 tablets by mouth daily.    [provider]  clonazePAM (KLONOPIN) 0.5 MG tablet TAKE 1/2 TO 1 (ONE-HALF TO ONE) TABLET BY MOUTH AT BEDTIME AS NEEDED Patient taking differently: Take 0.25-0.5 mg by mouth daily as needed for anxiety. 11/30/20   Hilts, Legrand Como, MD  cyanocobalamin (,VITAMIN B-12,) 1000 MCG/ML injection Inject 1,000 mcg into the muscle once a week. 03/31/22   [provider]  Cyanocobalamin (VITAMIN B12) 1000 MCG TBCR Take 1,000 mcg by mouth daily.    [provider]  diltiazem (CARDIZEM CD) 180 MG 24 hr capsule TAKE 1 CAPSULE(180 MG) BY MOUTH DAILY 05/02/22   Troy Sine, MD  empagliflozin (JARDIANCE) 10 MG TABS tablet Take 1 tablet (10 mg total) by mouth daily with breakfast. 05/18/22   Elayne Snare, MD  escitalopram (LEXAPRO) 10 MG tablet 1/2 tablet once a day x 1 week, then increase to 1 tablet daily 05/10/22   [provider]  Eszopiclone 3 MG TABS Take 1 tablet (3 mg total) by mouth at bedtime as needed. Take immediately before bedtime Patient taking differently: Take 3 mg by mouth at bedtime as needed (sleep). Take immediately before bedtime 11/09/20   Hilts, Michael, MD  Ferrous Sulfate (IRON) 325 (65 Fe) MG TABS Take 325 mg by mouth 2 (two) times daily.    [provider]  fluconazole (DIFLUCAN) 150 MG tablet Take 1 tablet (150 mg total) by mouth every three (3)  days as needed. 11/24/20   Hilts, Legrand Como, MD  glucose blood (ACCU-CHEK GUIDE) test strip CHECK BLOOD GLUCOSE IN THE MORNING AND AT BEDTIME.  E11.9 code. 05/22/22   Elayne Snare, MD  magnesium oxide (MAG-OX) 400 (241.3 Mg) MG tablet Take 400 mg by mouth daily.    [provider]  metFORMIN (GLUCOPHAGE) 1000 MG tablet Take 1 tablet (1,000 mg total) by mouth 2 (two) times daily with a meal. 03/10/22   Elayne Snare, MD  metoprolol tartrate (LOPRESSOR) 25 MG tablet Take 1 tablet (25 mg total) by mouth 2 (two) times daily. 02/03/22   [name], NP  Omega-3 Fatty Acids (FISH OIL) 1200 MG CAPS Take 1,200 mg by mouth 2 (two) times daily.    [provider]  omeprazole (PRILOSEC) 40 MG capsule Take 1 capsule by mouth once daily 12/09/20   Hilts, Legrand Como, MD  repaglinide (PRANDIN) 1 MG tablet Take 1 tablet (1 mg total) by mouth 2 (two) times daily before a meal. 07/12/22   Elayne Snare, MD  rosuvastatin (CRESTOR) 20 MG tablet Take 1 tablet (20 mg total) by mouth daily. 01/03/21 08/09/22  Troy Sine, MD  sertraline (ZOLOFT) 50 MG tablet Take 50 mg by mouth daily. Patient not taking: Reported on 08/04/2022 03/31/22   [provider]  spironolactone (ALDACTONE) 25 MG  tablet TAKE 1/2 TABLET BY MOUTH EVERY DAY. Keep scheduled appointment in Jan. for additional refills. 07/14/22   Troy Sine, MD  tiZANidine (ZANAFLEX) 2 MG tablet Take 1-2 tablets (2-4 mg total) by mouth every 6 (six) hours as needed for muscle spasms. Patient not taking: Reported on 08/04/2022 03/17/20   Hilts, Legrand Como, MD  tolterodine (DETROL) 2 MG tablet Take 2 mg by mouth 2 (two) times daily. Patient not taking: Reported on 08/04/2022 08/15/21   [provider]  vitamin C (ASCORBIC ACID) 250 MG tablet Take 250 mg by mouth daily.    [provider]  warfarin (COUMADIN) 5 MG tablet TAKE 1/2 TO 1 TABLET BY MOUTH EVERY DAY AS DIRECTED BY CLINIC 07/13/22   Troy Sine, MD    Physical Exam    Vital Signs:  [name] does not have vital signs available for review today.  Given telephonic nature of communication, physical exam is limited. AAOx3. NAD. Normal affect.  Speech and respirations are unlabored.  Accessory Clinical Findings    None  Assessment & Plan    1.  Preoperative Cardiovascular Risk Assessment: The patient is doing well from a cardiac perspective. Therefore, based on ACC/AHA guidelines, the patient would be at acceptable risk for the planned procedure without further cardiovascular testing.  According to the Revised Cardiac Risk Index (RCRI), her Perioperative Risk of Major Cardiac Event is (%): 0.9. Her Functional Capacity in METs is: 5.04 according to the [facility] Activity Status Index (DASI).  The patient was advised that if she develops new symptoms prior to surgery to contact our office to arrange for a follow-up visit, and she verbalized understanding.  Per office protocol, patient can hold warfarin for 5 days prior to procedure.   Patient will NOT need bridging with Lovenox (enoxaparin) around procedure.  A copy of this note will be routed to requesting surgeon.  Time:   Today, I have spent 10 minutes with the patient with telehealth  technology discussing medical history, symptoms, and management plan.     Emmaline Life, NP-C  08/10/2022, 10:42 AM [address], [address] Office [phone] Fax [phone])  [phone]

## 2022-08-09 NOTE — Telephone Encounter (Signed)
Pt agreeable to tele pre op appt 08/10/22 @ 10:40. Pt is needing to get her injection. This has been delayed as we did not have the clearance request, see notes. Pt states she is in pain and now her procedure has been moved out to 08/29/22 however, if we can get her cleared before then, Dr. Davy Pique office may be able to move appt up sooner. Pt asked for appt ASAP please.    Med rec and consent are done.

## 2022-08-10 ENCOUNTER — Ambulatory Visit: Payer: Medicare Other | Attending: Internal Medicine | Admitting: Nurse Practitioner

## 2022-08-10 ENCOUNTER — Encounter: Payer: Self-pay | Admitting: Nurse Practitioner

## 2022-08-10 DIAGNOSIS — Z01810 Encounter for preprocedural cardiovascular examination: Secondary | ICD-10-CM

## 2022-08-14 ENCOUNTER — Ambulatory Visit (INDEPENDENT_AMBULATORY_CARE_PROVIDER_SITE_OTHER): Payer: Medicare Other | Admitting: Cardiology

## 2022-08-14 DIAGNOSIS — Z5181 Encounter for therapeutic drug level monitoring: Secondary | ICD-10-CM | POA: Diagnosis not present

## 2022-08-14 LAB — POCT INR: INR: 2.7 (ref 2.0–3.0)

## 2022-08-21 ENCOUNTER — Ambulatory Visit (INDEPENDENT_AMBULATORY_CARE_PROVIDER_SITE_OTHER): Payer: Medicare Other | Admitting: *Deleted

## 2022-08-21 DIAGNOSIS — Z7901 Long term (current) use of anticoagulants: Secondary | ICD-10-CM | POA: Diagnosis not present

## 2022-08-21 DIAGNOSIS — I480 Paroxysmal atrial fibrillation: Secondary | ICD-10-CM | POA: Diagnosis not present

## 2022-08-21 LAB — POCT INR: INR: 2.2 (ref 2.0–3.0)

## 2022-08-21 NOTE — Patient Instructions (Signed)
Description   Spoke with patient advised to continue taking the dose you have been taking, which is warfarin 1 tablet ('5mg'$ ) daily except 1/2 tablet (2.'5mg'$ ) each Sunday, Wednesday, and Friday. Then start holding for text procedure. Stay consistent with greens (3 per week).  [phone]; Procedure 11/28 will HOLD 5 Days prior 11/23-11/27

## 2022-08-29 DIAGNOSIS — M5416 Radiculopathy, lumbar region: Secondary | ICD-10-CM | POA: Diagnosis not present

## 2022-08-31 DIAGNOSIS — M7751 Other enthesopathy of right foot: Secondary | ICD-10-CM | POA: Diagnosis not present

## 2022-08-31 DIAGNOSIS — B353 Tinea pedis: Secondary | ICD-10-CM | POA: Diagnosis not present

## 2022-08-31 DIAGNOSIS — M2041 Other hammer toe(s) (acquired), right foot: Secondary | ICD-10-CM | POA: Diagnosis not present

## 2022-08-31 DIAGNOSIS — M65871 Other synovitis and tenosynovitis, right ankle and foot: Secondary | ICD-10-CM | POA: Diagnosis not present

## 2022-09-01 ENCOUNTER — Other Ambulatory Visit: Payer: Self-pay

## 2022-09-01 DIAGNOSIS — E1165 Type 2 diabetes mellitus with hyperglycemia: Secondary | ICD-10-CM

## 2022-09-01 MED ORDER — METFORMIN HCL 1000 MG PO TABS: 1000.0000 mg | ORAL_TABLET | Freq: Two times a day (BID) | ORAL | 0 refills | Status: DC

## 2022-09-05 ENCOUNTER — Ambulatory Visit (INDEPENDENT_AMBULATORY_CARE_PROVIDER_SITE_OTHER): Payer: Medicare Other | Admitting: *Deleted

## 2022-09-05 DIAGNOSIS — I480 Paroxysmal atrial fibrillation: Secondary | ICD-10-CM

## 2022-09-05 DIAGNOSIS — Z7901 Long term (current) use of anticoagulants: Secondary | ICD-10-CM | POA: Diagnosis not present

## 2022-09-05 LAB — POCT INR: INR: 3.1 — AB (ref 2.0–3.0)

## 2022-09-05 NOTE — Patient Instructions (Addendum)
Description   Spoke with patient advised to take 1/2 tablet today then continue taking the dose you have been taking, which is warfarin 1 tablet ('5mg'$ ) daily except 1/2 tablet (2.'5mg'$ ) each Sunday and Wednesday. Stay consistent with greens (3 per week).   Anticoagulation Clinic [phone]

## 2022-09-08 DIAGNOSIS — F5101 Primary insomnia: Secondary | ICD-10-CM | POA: Diagnosis not present

## 2022-09-08 DIAGNOSIS — R051 Acute cough: Secondary | ICD-10-CM | POA: Diagnosis not present

## 2022-09-08 DIAGNOSIS — F320 Major depressive disorder, single episode, mild: Secondary | ICD-10-CM | POA: Diagnosis not present

## 2022-09-08 DIAGNOSIS — F419 Anxiety disorder, unspecified: Secondary | ICD-10-CM | POA: Diagnosis not present

## 2022-09-08 DIAGNOSIS — D6869 Other thrombophilia: Secondary | ICD-10-CM | POA: Diagnosis not present

## 2022-09-08 DIAGNOSIS — K219 Gastro-esophageal reflux disease without esophagitis: Secondary | ICD-10-CM | POA: Diagnosis not present

## 2022-09-08 DIAGNOSIS — E538 Deficiency of other specified B group vitamins: Secondary | ICD-10-CM | POA: Diagnosis not present

## 2022-09-08 DIAGNOSIS — I700 Atherosclerosis of aorta: Secondary | ICD-10-CM | POA: Diagnosis not present

## 2022-09-08 DIAGNOSIS — J069 Acute upper respiratory infection, unspecified: Secondary | ICD-10-CM | POA: Diagnosis not present

## 2022-09-08 DIAGNOSIS — E7800 Pure hypercholesterolemia, unspecified: Secondary | ICD-10-CM | POA: Diagnosis not present

## 2022-09-08 DIAGNOSIS — I480 Paroxysmal atrial fibrillation: Secondary | ICD-10-CM | POA: Diagnosis not present

## 2022-09-08 DIAGNOSIS — E1169 Type 2 diabetes mellitus with other specified complication: Secondary | ICD-10-CM | POA: Diagnosis not present

## 2022-09-08 DIAGNOSIS — G4733 Obstructive sleep apnea (adult) (pediatric): Secondary | ICD-10-CM | POA: Diagnosis not present

## 2022-09-08 DIAGNOSIS — Z03818 Encounter for observation for suspected exposure to other biological agents ruled out: Secondary | ICD-10-CM | POA: Diagnosis not present

## 2022-09-08 DIAGNOSIS — I10 Essential (primary) hypertension: Secondary | ICD-10-CM | POA: Diagnosis not present

## 2022-09-11 ENCOUNTER — Other Ambulatory Visit: Payer: Self-pay | Admitting: Endocrinology

## 2022-09-12 ENCOUNTER — Telehealth: Payer: Self-pay | Admitting: *Deleted

## 2022-09-12 NOTE — Telephone Encounter (Signed)
Called pt since her Anticoagulation Monitoring is due. There was no answer therefore, left a message for her to call back in reference to this.

## 2022-09-13 ENCOUNTER — Ambulatory Visit (INDEPENDENT_AMBULATORY_CARE_PROVIDER_SITE_OTHER): Payer: Medicare Other | Admitting: *Deleted

## 2022-09-13 ENCOUNTER — Encounter: Payer: Self-pay | Admitting: Cardiovascular Disease

## 2022-09-13 DIAGNOSIS — Z7901 Long term (current) use of anticoagulants: Secondary | ICD-10-CM

## 2022-09-13 DIAGNOSIS — I480 Paroxysmal atrial fibrillation: Secondary | ICD-10-CM

## 2022-09-13 LAB — POCT INR: INR: 2.8 (ref 2.0–3.0)

## 2022-09-15 ENCOUNTER — Telehealth: Payer: Self-pay

## 2022-09-15 NOTE — Telephone Encounter (Signed)
LPMTCB and discuss INR results

## 2022-09-20 DIAGNOSIS — M7751 Other enthesopathy of right foot: Secondary | ICD-10-CM | POA: Diagnosis not present

## 2022-09-22 ENCOUNTER — Telehealth: Payer: Self-pay

## 2022-09-22 NOTE — Telephone Encounter (Signed)
Lpm to check INR 

## 2022-09-25 LAB — POCT INR: INR: 2.5 (ref 2.0–3.0)

## 2022-09-26 ENCOUNTER — Ambulatory Visit (INDEPENDENT_AMBULATORY_CARE_PROVIDER_SITE_OTHER): Payer: Medicare Other

## 2022-09-26 DIAGNOSIS — Z5181 Encounter for therapeutic drug level monitoring: Secondary | ICD-10-CM | POA: Diagnosis not present

## 2022-09-26 NOTE — Patient Instructions (Signed)
Description   Spoke with patient advised to continue taking the dose you have been taking, which is warfarin 1 tablet ('5mg'$ ) daily except 1/2 tablet (2.'5mg'$ ) each Sunday and Wednesday.  Stay consistent with greens (3 per week).   Recheck INR in 1 week Anticoagulation Clinic [phone]

## 2022-10-03 ENCOUNTER — Ambulatory Visit (INDEPENDENT_AMBULATORY_CARE_PROVIDER_SITE_OTHER): Payer: Medicare Other | Admitting: Internal Medicine

## 2022-10-03 DIAGNOSIS — Z5181 Encounter for therapeutic drug level monitoring: Secondary | ICD-10-CM | POA: Diagnosis not present

## 2022-10-03 DIAGNOSIS — Z7901 Long term (current) use of anticoagulants: Secondary | ICD-10-CM

## 2022-10-03 DIAGNOSIS — I480 Paroxysmal atrial fibrillation: Secondary | ICD-10-CM

## 2022-10-03 LAB — POCT INR: INR: 2.8 (ref 2.0–3.0)

## 2022-10-08 ENCOUNTER — Other Ambulatory Visit: Payer: Self-pay | Admitting: Cardiovascular Disease

## 2022-10-08 DIAGNOSIS — I480 Paroxysmal atrial fibrillation: Secondary | ICD-10-CM

## 2022-10-09 ENCOUNTER — Encounter: Payer: Self-pay | Admitting: Cardiovascular Disease

## 2022-10-09 MED ORDER — METOPROLOL TARTRATE 25 MG PO TABS: 25.0000 mg | ORAL_TABLET | Freq: Two times a day (BID) | ORAL | 3 refills | Status: DC

## 2022-10-15 NOTE — Progress Notes (Unsigned)
Electrophysiology Office Note:    Date:  10/15/2022   ID:  Debra Wright, Debra Wright 10-Nov-1950, MRN [id number]  PCP:  Debra Wright, [facility] Cardiologist:  Debra Majestic, MD  [facility] Electrophysiologist:  Debra Epley, MD   Referring MD: Debra Wright, [place]   Chief Complaint: AF  History of Present Illness:    Debra Wright is a 72 y.o. female who presents for an evaluation of AF at the request of Debra Deforest, PA-C. Their medical history includes pAF, OSA, insomnia and prior falls. She saw Debra Wright in clinic April 14, 2022.  She also follows with Debra Wright.  At her July appointment she was maintaining sinus rhythm.  Given her history of falls, trouble with INR's, trouble with bleeding she was interested in avoiding long-term exposure to anticoagulation.     Past Medical History:  Diagnosis Date   Anxiety    Arthritis    "knees; right thumb" (10/14/2014)   Basal cell carcinoma    "burned off upper lip & right shoulder"   Chronic anticoagulation    CHADS VASC=3   Complication of anesthesia    after spinal anesthesia for left knee replacement, bladder did "not wake up". Was incontinent for 4 days post surgery   Depression    Fibroid tumor    Frequent diarrhea    GERD (gastroesophageal reflux disease)    High cholesterol    Hypertension    EF 65-70% grade 2DD   Insomnia    Migraine    "frequently when I was younger; maybe monthly now" (10/14/2014) - none after hysterectomy   PAF (paroxysmal atrial fibrillation) ([place]) 05/02/2018   converted with rate control   Pernicious anemia    pt not aware of this   Sleep apnea    uses a cpap   Type II diabetes mellitus ([place])    Vitamin B 12 deficiency     Past Surgical History:  Procedure Laterality Date   ABDOMINAL HYSTERECTOMY  1988   ACHILLES TENDON SURGERY Right    APPENDECTOMY  1988   BILATERAL OOPHORECTOMY  2004   CARPAL TUNNEL RELEASE Bilateral 1986   CATARACT EXTRACTION W/ INTRAOCULAR LENS  IMPLANT, BILATERAL  Bilateral 2011   COLONOSCOPY     JOINT REPLACEMENT     SHOULDER ARTHROSCOPY DISTAL CLAVICLE EXCISION AND OPEN ROTATOR CUFF REPAIR Right 11/2013   TOENAIL EXCISION Right 04/2018   "big toe"   TONSILLECTOMY  1950's   TOTAL KNEE ARTHROPLASTY Left 10/13/2014   Procedure: LEFT TOTAL KNEE ARTHROPLASTY;  Surgeon: Mcarthur Rossetti, MD;  Location: [facility];  Service: Orthopedics;  Laterality: Left;   TOTAL KNEE ARTHROPLASTY Right 12/10/2018   Procedure: RIGHT TOTAL KNEE ARTHROPLASTY;  Surgeon: Mcarthur Rossetti, MD;  Location: [facility];  Service: Orthopedics;  Laterality: Right;    Current Medications: No outpatient medications have been marked as taking for the 10/16/22 encounter (Appointment) with Debra Epley, MD.     Allergies:   Antivert [meclizine hcl], Meclizine, Trazodone hcl, Zolpidem, and Zolpidem tartrate   Social History   Socioeconomic History   Marital status: Married    Spouse name: Not on file   Number of children: 1   Years of education: 14   Highest education level: Not on file  Occupational History   Not on file  Tobacco Use   Smoking status: Never   Smokeless tobacco: Never  Vaping Use   Vaping Use: Never used  Substance and Sexual Activity  Alcohol use: Yes    Comment: 10/14/2014 "might have a drink when I'm out once/month"   Drug use: No   Sexual activity: Not Currently  Other Topics Concern   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, sports for 36   Married       Right handed    Drinks caffeine one cup of coffee daily   An apartment    Social Determinants of Radio broadcast assistant Strain: Not on file  Food Insecurity: Not on file  Transportation Needs: Not on file  Physical Activity: Not on file  Stress: Not on file  Social Connections: Not on file     Family History: The patient's family history includes Arthritis in her father, maternal grandfather, maternal grandmother, mother, paternal grandfather, and  paternal grandmother; Diabetes in her brother, father, maternal grandmother, mother, paternal grandfather, and paternal grandmother; Heart disease in her father; Hyperlipidemia in her father, maternal grandfather, maternal grandmother, mother, paternal grandfather, and paternal grandmother; Hypertension in her brother, maternal grandfather, maternal grandmother, mother, paternal grandfather, and paternal grandmother; Stroke in her brother.  ROS:   Please see the history of present illness.    All other systems reviewed and are negative.  EKGs/Labs/Other Studies Reviewed:    The following studies were reviewed today:  December 23, 2021 echo EF 65 RV normal Moderately dilated left atrium Small pericardial effusion Trivial MR    Recent Labs: 07/12/2022: ALT 15; BUN 9; Creatinine, Ser 0.70; Hemoglobin 14.2; Platelets 264; Potassium 3.8; Sodium 139  Recent Lipid Panel    Component Value Date/Time   CHOL 146 05/11/2022 1243   TRIG 360.0 (H) 05/11/2022 1243   HDL 35.20 (L) 05/11/2022 1243   CHOLHDL 4 05/11/2022 1243   VLDL 72.0 (H) 05/11/2022 1243   LDLCALC 66 08/07/2018 1530   LDLDIRECT 82.0 05/11/2022 1243    Physical Exam:    VS:  There were no vitals taken for this visit.    Wt Readings from Last 3 Encounters:  08/04/22 235 lb (106.6 kg)  07/12/22 229 lb (103.9 kg)  05/18/22 225 lb 6.4 oz (102.2 kg)     GEN: *** Well nourished, well developed in no acute distress CARDIAC: ***RRR, no murmurs, rubs, gallops RESPIRATORY:  Clear to auscultation without rales, wheezing or rhonchi  PSYCHIATRIC:  Normal affect       ASSESSMENT:    No diagnosis found. PLAN:    In order of problems listed above:   #Paroxysmal atrial fibrillation Low burden of atrial fibrillation Currently prescribed warfarin for stroke risk reduction although she prefers a strategy that avoids long-term exposure to anticoagulation given the associated bleeding risks.  Her history of falls is particularly  concerning to her.  She is also had supratherapeutic INRs in the past ( 6.7 in October 2023).  ------------  I have seen Debra Wright in the office today who is being considered for a Watchman left atrial appendage closure device. I believe they will benefit from this procedure given their history of atrial fibrillation, CHA2DS2-VASc score of 5 and unadjusted ischemic stroke rate of 7.2% per year. Unfortunately, the patient is not felt to be a long term anticoagulation candidate secondary to a history of falls, supratherapeutic INR. The patient's chart has been reviewed and I feel that they would be a candidate for short term oral anticoagulation after Watchman implant.   It is my belief that after undergoing a LAA closure procedure, Alvis  Diane Brees will not need long term anticoagulation which eliminates anticoagulation side effects and major bleeding risk.   Procedural risks for the Watchman implant have been reviewed with the patient including a 0.5% risk of stroke, <1% risk of perforation and <1% risk of device embolization. Other risks include bleeding, vascular damage, tamponade, worsening renal function, and death. The patient understands these risk and wishes to proceed.     The published clinical data on the safety and effectiveness of WATCHMAN include but are not limited to the following: - Holmes DR, Mechele Claude, Sick P et al. for the PROTECT AF Investigators. Percutaneous closure of the left atrial appendage versus warfarin therapy for prevention of stroke in patients with atrial fibrillation: a randomised non-inferiority trial. Lancet 2009; 374: 534-42. Mechele Claude, Doshi SK, Abelardo Diesel D et al. on behalf of the PROTECT AF Investigators. Percutaneous Left Atrial Appendage Closure for Stroke Prophylaxis in Patients With Atrial Fibrillation 2.3-Year Follow-up of the PROTECT AF (Watchman Left Atrial Appendage System for Embolic Protection in Patients With Atrial Fibrillation) Trial.  Circulation 2013; 127:720-729. - Alli O, Doshi S,  Kar S, Reddy VY, Sievert H et al. Quality of Life Assessment in the Randomized PROTECT AF (Percutaneous Closure of the Left Atrial Appendage Versus Warfarin Therapy for Prevention of Stroke in Patients With Atrial Fibrillation) Trial of Patients at Risk for Stroke With Nonvalvular Atrial Fibrillation. J Am Coll Cardiol 2013; 17:6160-7. Vertell Limber DR, Tarri Abernethy, Price M, [place], Sievert H, Doshi S, Huber K, Reddy V. Prospective randomized evaluation of the Watchman left atrial appendage Device in patients with atrial fibrillation versus long-term warfarin therapy; the PREVAIL trial. Journal of the [employer] of Cardiology, Vol. 4, No. 1, 2014, 1-11. - Kar S, Doshi SK, Sadhu A, Horton R, Osorio J et al. Primary outcome evaluation of a next-generation left atrial appendage closure device: results from the PINNACLE FLX trial. Circulation 2021;143(18)1754-1762.    After today's visit with the patient which was dedicated solely for shared decision making visit regarding LAA closure device, the patient decided to proceed with the LAA appendage closure procedure scheduled to be done in the near future at [facility]. Prior to the procedure, I would like to obtain a gated CT scan of the chest with contrast timed for PV/LA visualization.     HAS-BLED score 2 Hypertension Yes  Abnormal renal and liver function (Dialysis, transplant, Cr >2.26 mg/dL /Cirrhosis or Bilirubin >2x Normal or AST/ALT/AP >3x Normal) No  Stroke No  Bleeding No  Labile INR (Unstable/high INR) No  Elderly (>65) Yes  Drugs or alcohol (? 8 drinks/week, anti-plt or NSAID) No   CHA2DS2-VASc Score = 5  The patient's score is based upon: CHF History: 1 HTN History: 1 Diabetes History: 1 Stroke History: 0 Vascular Disease History: 0 Age Score: 1 Gender Score: 1       Medication Adjustments/Labs and Tests Ordered: Current medicines are reviewed at length with the  patient today.  Concerns regarding medicines are outlined above.  No orders of the defined types were placed in this encounter.  No orders of the defined types were placed in this encounter.    Signed, Hilton Cork. Quentin Ore, MD, [facility] 10/15/2022 8:42 PM    Electrophysiology [facility]

## 2022-10-16 ENCOUNTER — Encounter: Payer: Self-pay | Admitting: Cardiology

## 2022-10-16 ENCOUNTER — Ambulatory Visit: Payer: Medicare Other | Attending: Cardiology | Admitting: Cardiology

## 2022-10-16 VITALS — BP 118/68 | HR 74 | Ht 62.0 in | Wt 230.0 lb

## 2022-10-16 DIAGNOSIS — I480 Paroxysmal atrial fibrillation: Secondary | ICD-10-CM | POA: Insufficient documentation

## 2022-10-16 DIAGNOSIS — W19XXXD Unspecified fall, subsequent encounter: Secondary | ICD-10-CM | POA: Insufficient documentation

## 2022-10-16 DIAGNOSIS — Z9181 History of falling: Secondary | ICD-10-CM

## 2022-10-16 NOTE — Patient Instructions (Signed)
Medication Instructions:  Your physician recommends that you continue on your current medications as directed. Please refer to the Current Medication list given to you today.  *If you need a refill on your cardiac medications before your next appointment, please call your pharmacy*   Lab Work: None ordered   Testing/Procedures: Your physician has requested that you have Left atrial appendage (LAA) closure device implantation is a procedure to put a small device in the LAA of the heart. The LAA is a small sac in the wall of the heart's left upper chamber. Blood clots can form in this area. The device, Watchman closes the LAA to help prevent a blood clot and stroke.  Please contact the office, after speaking with your husband, and to schedule this procedure.   Follow-Up: At [facility], you and your health needs are our priority.  As part of our continuing mission to provide you with exceptional heart care, we have created designated Provider Care Teams.  These Care Teams include your primary Cardiologist (physician) and Advanced Practice Providers (APPs -  Physician Assistants and Nurse Practitioners) who all work together to provide you with the care you need, when you need it.   Your next appointment:   To   be determined  The format for your next appointment:   In Person  Provider:   Lars Mage, MD    Thank you for choosing [facility]!!   [phone]  Other Instructions

## 2022-10-16 NOTE — Progress Notes (Signed)
Electrophysiology Office Note:    Date:  10/16/2022   ID:  Debra Wright, Debra Wright 05/30/1951, MRN [id number]  PCP:  Debra Wright, [facility] Cardiologist:  Debra Majestic, MD  [facility] Electrophysiologist:  Debra Epley, MD   Referring MD: Debra Wright, [place]   Chief Complaint: AF  History of Present Illness:    Debra Wright is a 72 y.o. female who presents for an evaluation of AF at the request of Debra Deforest, PA-C. Their medical history includes pAF, OSA, insomnia and prior falls. She saw Debra Wright in clinic April 14, 2022.  She also follows with Debra Wright.  At her July appointment she was maintaining sinus rhythm.  Given her history of falls, trouble with INR's, trouble with bleeding she was interested in avoiding long-term exposure to anticoagulation.  Today, she reports a fall last summer while working in her garden due to losing her step. She previously had a fall in her home due to loss of balance.  Her Metoprolol dosage had been increased  to 1 tab. She is tolerating her Warfarin. She expresses concern of lifting her disabled husband into his bed following her watchman device implantation. She reports that she has DM and uses a CPAP.  She denies any chest pain, shortness of breath, or peripheral edema. No lightheadedness, headaches, syncope, orthopnea, or PND.      Past Medical History:  Diagnosis Date   Anxiety    Arthritis    "knees; right thumb" (10/14/2014)   Basal cell carcinoma    "burned off upper lip & right shoulder"   Chronic anticoagulation    CHADS VASC=3   Complication of anesthesia    after spinal anesthesia for left knee replacement, bladder did "not wake up". Was incontinent for 4 days post surgery   Depression    Fibroid tumor    Frequent diarrhea    GERD (gastroesophageal reflux disease)    High cholesterol    Hypertension    EF 65-70% grade 2DD   Insomnia    Migraine    "frequently when I was younger; maybe monthly now" (10/14/2014) -  none after hysterectomy   PAF (paroxysmal atrial fibrillation) ([place]) 05/02/2018   converted with rate control   Pernicious anemia    pt not aware of this   Sleep apnea    uses a cpap   Type II diabetes mellitus ([place])    Vitamin B 12 deficiency     Past Surgical History:  Procedure Laterality Date   ABDOMINAL HYSTERECTOMY  1988   ACHILLES TENDON SURGERY Right    APPENDECTOMY  1988   BILATERAL OOPHORECTOMY  2004   CARPAL TUNNEL RELEASE Bilateral 1986   CATARACT EXTRACTION W/ INTRAOCULAR LENS  IMPLANT, BILATERAL Bilateral 2011   COLONOSCOPY     JOINT REPLACEMENT     SHOULDER ARTHROSCOPY DISTAL CLAVICLE EXCISION AND OPEN ROTATOR CUFF REPAIR Right 11/2013   TOENAIL EXCISION Right 04/2018   "big toe"   TONSILLECTOMY  1950's   TOTAL KNEE ARTHROPLASTY Left 10/13/2014   Procedure: LEFT TOTAL KNEE ARTHROPLASTY;  Surgeon: Mcarthur Rossetti, MD;  Location: [place];  Service: Orthopedics;  Laterality: Left;   TOTAL KNEE ARTHROPLASTY Right 12/10/2018   Procedure: RIGHT TOTAL KNEE ARTHROPLASTY;  Surgeon: Mcarthur Rossetti, MD;  Location: [place];  Service: Orthopedics;  Laterality: Right;    Current Medications: Current Meds  Medication Sig   Blood Glucose Monitoring Suppl (ACCU-CHEK GUIDE ME) w/Device KIT USE AS DIRECTED.  Dx E11.9.   calcium carbonate (TUMS - DOSED IN MG ELEMENTAL CALCIUM) 500 MG chewable tablet Chew 2 tablets by mouth daily as needed for indigestion or heartburn.   Calcium Carbonate-Vitamin D (CALCIUM 600 + D PO) Take 1 tablet by mouth daily.   cetirizine (ZYRTEC) 10 MG tablet Take 10 mg by mouth daily as needed for allergies.   CINNAMON PO Take 2 tablets by mouth daily.   clonazePAM (KLONOPIN) 0.5 MG tablet TAKE 1/2 TO 1 (ONE-HALF TO ONE) TABLET BY MOUTH AT BEDTIME AS NEEDED (Patient taking differently: Take 0.25-0.5 mg by mouth daily as needed for anxiety.)   cyanocobalamin (,VITAMIN B-12,) 1000 MCG/ML injection Inject 1,000 mcg into the muscle once a week.    Cyanocobalamin (VITAMIN B12) 1000 MCG TBCR Take 1,000 mcg by mouth daily.   diltiazem (CARDIZEM CD) 180 MG 24 hr capsule TAKE 1 CAPSULE(180 MG) BY MOUTH DAILY   escitalopram (LEXAPRO) 10 MG tablet 1/2 tablet once a day x 1 week, then increase to 1 tablet daily   Eszopiclone 3 MG TABS Take 1 tablet (3 mg total) by mouth at bedtime as needed. Take immediately before bedtime (Patient taking differently: Take 3 mg by mouth at bedtime as needed (sleep). Take immediately before bedtime)   Ferrous Sulfate (IRON) 325 (65 Fe) MG TABS Take 325 mg by mouth 2 (two) times daily.   fluconazole (DIFLUCAN) 150 MG tablet Take 1 tablet (150 mg total) by mouth every three (3) days as needed.   glucose blood (ACCU-CHEK GUIDE) test strip CHECK BLOOD GLUCOSE IN THE MORNING AND AT BEDTIME.  E11.9 code.   JARDIANCE 10 MG TABS tablet TAKE 1 TABLET(10 MG) BY MOUTH DAILY WITH BREAKFAST   magnesium oxide (MAG-OX) 400 (241.3 Mg) MG tablet Take 400 mg by mouth daily.   metFORMIN (GLUCOPHAGE) 1000 MG tablet Take 1 tablet (1,000 mg total) by mouth 2 (two) times daily with a meal.   metoprolol tartrate (LOPRESSOR) 25 MG tablet Take 1 tablet (25 mg total) by mouth 2 (two) times daily.   MYRBETRIQ 25 MG TB24 tablet Take 25 mg by mouth daily.   Omega-3 Fatty Acids (FISH OIL) 1200 MG CAPS Take 1,200 mg by mouth 2 (two) times daily.   omeprazole (PRILOSEC) 40 MG capsule Take 1 capsule by mouth once daily   repaglinide (PRANDIN) 1 MG tablet Take 1 tablet (1 mg total) by mouth 2 (two) times daily before a meal.   spironolactone (ALDACTONE) 25 MG tablet TAKE 1/2 TABLET BY MOUTH EVERY DAY. Keep scheduled appointment in Jan. for additional refills.   tiZANidine (ZANAFLEX) 2 MG tablet Take 1-2 tablets (2-4 mg total) by mouth every 6 (six) hours as needed for muscle spasms.   tolterodine (DETROL) 2 MG tablet Take 2 mg by mouth 2 (two) times daily.   vitamin C (ASCORBIC ACID) 250 MG tablet Take 250 mg by mouth daily.   warfarin (COUMADIN) 5  MG tablet TAKE 1/2 TO 1 TABLET BY MOUTH EVERY DAY AS DIRECTED BY CLINIC     Allergies:   Antivert [meclizine hcl], Meclizine, Trazodone hcl, Zolpidem, and Zolpidem tartrate   Social History   Socioeconomic History   Marital status: Married    Spouse name: Not on file   Number of children: 1   Years of education: 14   Highest education level: Not on file  Occupational History   Not on file  Tobacco Use   Smoking status: Never   Smokeless tobacco: Never  Vaping Use   Vaping  Use: Never used  Substance and Sexual Activity   Alcohol use: Yes    Comment: 10/14/2014 "might have a drink when I'm out once/month"   Drug use: No   Sexual activity: Not Currently  Other Topics Concern   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, sports for 41   Married       Right handed    Drinks caffeine one cup of coffee daily   An apartment    Social Determinants of Radio broadcast assistant Strain: Not on Art therapist Insecurity: Not on file  Transportation Needs: Not on file  Physical Activity: Not on file  Stress: Not on file  Social Connections: Not on file     Family History: The patient's family history includes Arthritis in her father, maternal grandfather, maternal grandmother, mother, paternal grandfather, and paternal grandmother; Diabetes in her brother, father, maternal grandmother, mother, paternal grandfather, and paternal grandmother; Heart disease in her father; Hyperlipidemia in her father, maternal grandfather, maternal grandmother, mother, paternal grandfather, and paternal grandmother; Hypertension in her brother, maternal grandfather, maternal grandmother, mother, paternal grandfather, and paternal grandmother; Stroke in her brother.  ROS:   Please see the history of present illness.    (+) recent falls All other systems reviewed and are negative.  EKGs/Labs/Other Studies Reviewed:    The following studies were reviewed today:  December 23, 2021  echo EF 65 RV normal Moderately dilated left atrium Small pericardial effusion Trivial MR    Recent Labs: 07/12/2022: ALT 15; BUN 9; Creatinine, Ser 0.70; Hemoglobin 14.2; Platelets 264; Potassium 3.8; Sodium 139  Recent Lipid Panel    Component Value Date/Time   CHOL 146 05/11/2022 1243   TRIG 360.0 (H) 05/11/2022 1243   HDL 35.20 (L) 05/11/2022 1243   CHOLHDL 4 05/11/2022 1243   VLDL 72.0 (H) 05/11/2022 1243   LDLCALC 66 08/07/2018 1530   LDLDIRECT 82.0 05/11/2022 1243    Physical Exam:    VS:  BP 118/68   Pulse 74   Ht '5\' 2"'$  (1.575 m)   Wt 230 lb (104.3 kg)   SpO2 97%   BMI 42.07 kg/m     Wt Readings from Last 3 Encounters:  10/16/22 230 lb (104.3 kg)  08/04/22 235 lb (106.6 kg)  07/12/22 229 lb (103.9 kg)     GEN:  Well nourished, well developed in no acute distress CARDIAC: RRR, no murmurs, rubs, gallops RESPIRATORY:  Clear to auscultation without rales, wheezing or rhonchi  PSYCHIATRIC:  Normal affect       ASSESSMENT:    1. PAF (paroxysmal atrial fibrillation) ([place])   2. Fall, subsequent encounter    PLAN:    In order of problems listed above:   #Paroxysmal atrial fibrillation Low burden of atrial fibrillation Currently prescribed warfarin for stroke risk reduction although she prefers a strategy that avoids long-term exposure to anticoagulation given the associated bleeding risks.  Her history of falls is particularly concerning to her.  She is also had supratherapeutic INRs in the past ( 6.7 in October 2023).  ------------  I have seen Debra Wright in the office today who is being considered for a Watchman left atrial appendage closure device. I believe they will benefit from this procedure given their history of atrial fibrillation, CHA2DS2-VASc score of 5 and unadjusted ischemic stroke rate of 7.2% per year. Unfortunately, the patient is not felt to be a long term anticoagulation candidate secondary  to a history of falls, supratherapeutic  INR. The patient's chart has been reviewed and I feel that they would be a candidate for short term oral anticoagulation after Watchman implant.   It is my belief that after undergoing a LAA closure procedure, Debra Wright will not need long term anticoagulation which eliminates anticoagulation side effects and major bleeding risk.   Procedural risks for the Watchman implant have been reviewed with the patient including a 0.5% risk of stroke, <1% risk of perforation and <1% risk of device embolization. Other risks include bleeding, vascular damage, tamponade, worsening renal function, and death. The patient understands these risks.  She will talk to her husband and let us know if she would like to proceed.   The published clinical data on the safety and effectiveness of WATCHMAN include but are not limited to the following: - Holmes DR, Mechele Claude, Sick P et al. for the PROTECT AF Investigators. Percutaneous closure of the left atrial appendage versus warfarin therapy for prevention of stroke in patients with atrial fibrillation: a randomised non-inferiority trial. Lancet 2009; 374: 534-42. Mechele Claude, Doshi SK, Abelardo Diesel D et al. on behalf of the PROTECT AF Investigators. Percutaneous Left Atrial Appendage Closure for Stroke Prophylaxis in Patients With Atrial Fibrillation 2.3-Year Follow-up of the PROTECT AF (Watchman Left Atrial Appendage System for Embolic Protection in Patients With Atrial Fibrillation) Trial. Circulation 2013; 127:720-729. - Alli O, Doshi S,  Kar S, Reddy VY, Sievert H et al. Quality of Life Assessment in the Randomized PROTECT AF (Percutaneous Closure of the Left Atrial Appendage Versus Warfarin Therapy for Prevention of Stroke in Patients With Atrial Fibrillation) Trial of Patients at Risk for Stroke With Nonvalvular Atrial Fibrillation. J Am Coll Cardiol 2013; 10:1751-0. Vertell Limber DR, Tarri Abernethy, Price M, Hart, Sievert H, Doshi S, Huber K, Reddy V. Prospective  randomized evaluation of the Watchman left atrial appendage Device in patients with atrial fibrillation versus long-term warfarin therapy; the PREVAIL trial. Journal of the [employer] of Cardiology, Vol. 4, No. 1, 2014, 1-11. - Kar S, Doshi SK, Sadhu A, Horton R, Osorio J et al. Primary outcome evaluation of a next-generation left atrial appendage closure device: results from the PINNACLE FLX trial. Circulation 2021;143(18)1754-1762.    After today's visit with the patient which was dedicated solely for shared decision making visit regarding LAA closure device, the patient decided to proceed with the LAA appendage closure procedure scheduled to be done in the near future at [facility]. Prior to the procedure, I would like to obtain a gated CT scan of the chest with contrast timed for PV/LA visualization.     HAS-BLED score 2 Hypertension Yes  Abnormal renal and liver function (Dialysis, transplant, Cr >2.26 mg/dL /Cirrhosis or Bilirubin >2x Normal or AST/ALT/AP >3x Normal) No  Stroke No  Bleeding No  Labile INR (Unstable/high INR) No  Elderly (>65) Yes  Drugs or alcohol (? 8 drinks/week, anti-plt or NSAID) No   CHA2DS2-VASc Score = 5  The patient's score is based upon: CHF History: 1 HTN History: 1 Diabetes History: 1 Stroke History: 0 Vascular Disease History: 0 Age Score: 1 Gender Score: 1    Medication Adjustments/Labs and Tests Ordered: Current medicines are reviewed at length with the patient today.  Concerns regarding medicines are outlined above.  No orders of the defined types were placed in this encounter.  No orders of the defined types were placed in this encounter.   I,Debra Wright,acting as a  scribe for Debra Epley, MD.,have documented all relevant documentation on the behalf of Debra Epley, MD,as directed by  Debra Epley, MD while in the presence of Debra Epley, MD.  I, Debra Epley, MD, have reviewed all documentation  for this visit. The documentation on 10/16/22 for the exam, diagnosis, procedures, and orders are all accurate and complete.   Signed, Debra Wright. Debra Ore, MD, [facility], [facility] 10/16/2022 4:03 PM    Electrophysiology [facility]

## 2022-10-16 NOTE — Telephone Encounter (Signed)
Called pt advised has 20 min past appointment time to get to our office.  Pt reports will be here in abut 7 minutes (Currently 3:21 pm).  No further concerns.

## 2022-10-17 ENCOUNTER — Telehealth: Payer: Self-pay

## 2022-10-17 ENCOUNTER — Ambulatory Visit (INDEPENDENT_AMBULATORY_CARE_PROVIDER_SITE_OTHER): Payer: Medicare Other | Admitting: *Deleted

## 2022-10-17 DIAGNOSIS — I480 Paroxysmal atrial fibrillation: Secondary | ICD-10-CM | POA: Diagnosis not present

## 2022-10-17 DIAGNOSIS — Z7901 Long term (current) use of anticoagulants: Secondary | ICD-10-CM | POA: Diagnosis not present

## 2022-10-17 LAB — POCT INR: INR: 2.1 (ref 2.0–3.0)

## 2022-10-17 NOTE — Patient Instructions (Signed)
Description   Spoke with patient advised to continue taking warfarin 1 tablet ('5mg'$ ) daily except 1/2 tablet (2.'5mg'$ ) each Sunday and Wednesday.  Stay consistent with greens (3 per week).   Recheck INR in 2 weeks Anticoagulation Clinic [phone]

## 2022-10-17 NOTE — Telephone Encounter (Signed)
Lpm to check INR 

## 2022-10-21 ENCOUNTER — Encounter: Payer: Self-pay | Admitting: Endocrinology

## 2022-10-23 DIAGNOSIS — Z6841 Body Mass Index (BMI) 40.0 and over, adult: Secondary | ICD-10-CM | POA: Diagnosis not present

## 2022-10-23 DIAGNOSIS — M5136 Other intervertebral disc degeneration, lumbar region: Secondary | ICD-10-CM | POA: Diagnosis not present

## 2022-10-23 DIAGNOSIS — M25552 Pain in left hip: Secondary | ICD-10-CM | POA: Diagnosis not present

## 2022-10-23 DIAGNOSIS — M5416 Radiculopathy, lumbar region: Secondary | ICD-10-CM | POA: Diagnosis not present

## 2022-10-24 ENCOUNTER — Other Ambulatory Visit: Payer: Self-pay | Admitting: Endocrinology

## 2022-10-24 ENCOUNTER — Other Ambulatory Visit (HOSPITAL_COMMUNITY): Payer: Self-pay

## 2022-10-24 MED ORDER — GLIMEPIRIDE 4 MG PO TABS: 4.0000 mg | ORAL_TABLET | Freq: Every day | ORAL | 3 refills | Status: DC

## 2022-10-24 NOTE — Telephone Encounter (Signed)
Debra Wright is showing 514 dollar copay Januvia is showing 506 dollar copay Both due to a deductible

## 2022-10-25 ENCOUNTER — Telehealth: Payer: Self-pay

## 2022-10-25 NOTE — Telephone Encounter (Signed)
The patient did not know if she wanted to proceed with Watchman at consult with Dr. Quentin Ore.   Called to follow-up - left message to call back.

## 2022-10-26 NOTE — Telephone Encounter (Signed)
Left message to call back for an update.

## 2022-10-31 ENCOUNTER — Telehealth: Payer: Self-pay

## 2022-10-31 ENCOUNTER — Encounter: Payer: Self-pay | Admitting: Cardiovascular Disease

## 2022-10-31 ENCOUNTER — Ambulatory Visit (INDEPENDENT_AMBULATORY_CARE_PROVIDER_SITE_OTHER): Payer: Medicare Other | Admitting: *Deleted

## 2022-10-31 DIAGNOSIS — Z7901 Long term (current) use of anticoagulants: Secondary | ICD-10-CM | POA: Diagnosis not present

## 2022-10-31 DIAGNOSIS — I480 Paroxysmal atrial fibrillation: Secondary | ICD-10-CM | POA: Diagnosis not present

## 2022-10-31 LAB — POCT INR: INR: 3.1 — AB (ref 2.0–3.0)

## 2022-10-31 NOTE — Patient Instructions (Signed)
Description   Spoke with patient advised to take 1/2 tablet of warfarin today then continue taking warfarin 1 tablet ('5mg'$ ) daily except 1/2 tablet (2.'5mg'$ ) each Sunday and Wednesday.  Stay consistent with greens (3 per week).   Recheck INR in 2 weeks Anticoagulation Clinic [phone]

## 2022-10-31 NOTE — Telephone Encounter (Signed)
   Pre-operative Risk Assessment    Patient Name: Debra Wright  DOB: 1951-01-04 MRN: [id number]      Request for Surgical Clearance    Procedure:   Lumbar Spine L5-S1 ESI  Date of Surgery:  Clearance 11/30/22                                 Surgeon:  Dr. Lenord Carbo  Surgeon's Group or Practice Name:  [facility]  Phone number:  [phone] Fax number:  [phone]   Type of Clearance Requested:   - Medical  - Pharmacy:  Hold Warfarin (Coumadin) 5 days prior    Type of Anesthesia:  Not Indicated   Additional requests/questions:    Tana Conch   10/31/2022, 2:51 PM

## 2022-11-01 MED ORDER — METOPROLOL TARTRATE 25 MG PO TABS: 25.0000 mg | ORAL_TABLET | Freq: Two times a day (BID) | ORAL | 0 refills | Status: DC

## 2022-11-01 MED ORDER — DILTIAZEM HCL ER COATED BEADS 180 MG PO CP24: ORAL_CAPSULE | ORAL | 0 refills | Status: DC

## 2022-11-01 NOTE — Telephone Encounter (Signed)
Primary Cardiologist:Thomas Claiborne Billings, MD  Chart reviewed as part of pre-operative protocol coverage. Because of Debra Wright's past medical history and time since last visit, he/she will require a follow-up visit in order to better assess preoperative cardiovascular risk.  Pre-op covering staff: - Patient has an appointment on 11/13/22 with Dr. Claiborne Billings at which time clearance can be addressed, or if she prefers to have this procedure done sooner, we will need to schedule a virtual visit since it has been > 2 months since her previous visit.   - Please contact requesting surgeon's office via preferred method (i.e, phone, fax) to inform them of need for appointment prior to surgery.  Request regarding holding anticoagulant has been addressed by Fuller Canada, RPH.   Emmaline Life, NP-C  11/01/2022, 4:00 PM [address], [address] Office [phone] Fax [phone]

## 2022-11-01 NOTE — Addendum Note (Signed)
Addended by: Caprice Beaver T on: 11/01/2022 02:58 PM   Modules accepted: Orders

## 2022-11-01 NOTE — Telephone Encounter (Signed)
Patient with diagnosis of afib on warfarin for anticoagulation.    Procedure: Lumbar Spine L5-S1 ESI  Date of procedure: 11/30/22  CHA2DS2-VASc Score = 5  This indicates a 7.2% annual risk of stroke. The patient's score is based upon: CHF History: 1 HTN History: 1 Diabetes History: 1 Stroke History: 0 Vascular Disease History: 0 Age Score: 1 Gender Score: 1  CrCl 79m/min using adjusted body weight Platelet count 264K  Per office protocol, patient can hold warfarin for 5 days prior to procedure. Patient will not need bridging with Lovenox (enoxaparin) around procedure.  **This guidance is not considered finalized until pre-operative APP has relayed final recommendations.**

## 2022-11-03 MED ORDER — SPIRONOLACTONE 25 MG PO TABS: ORAL_TABLET | ORAL | 1 refills | Status: DC

## 2022-11-03 NOTE — Addendum Note (Signed)
Addended by: Fidel Levy on: 11/03/2022 12:51 PM   Modules accepted: Orders

## 2022-11-08 ENCOUNTER — Ambulatory Visit: Payer: PRIVATE HEALTH INSURANCE | Admitting: Endocrinology

## 2022-11-08 DIAGNOSIS — Z780 Asymptomatic menopausal state: Secondary | ICD-10-CM | POA: Diagnosis not present

## 2022-11-10 ENCOUNTER — Encounter: Payer: Self-pay | Admitting: Cardiovascular Disease

## 2022-11-10 DIAGNOSIS — I480 Paroxysmal atrial fibrillation: Secondary | ICD-10-CM

## 2022-11-10 MED ORDER — WARFARIN SODIUM 5 MG PO TABS: ORAL_TABLET | ORAL | 0 refills | Status: DC

## 2022-11-10 NOTE — Telephone Encounter (Signed)
Prescription refill request received for warfarin Lov: 10/16/22 Quentin Ore)   Next INR check: 11/14/22 Warfarin tablet strength: 13m  Appropriate dose. Refill sent.

## 2022-11-13 ENCOUNTER — Encounter: Payer: Self-pay | Admitting: Cardiovascular Disease

## 2022-11-13 ENCOUNTER — Ambulatory Visit: Payer: Medicare Other | Attending: Cardiovascular Disease | Admitting: Cardiovascular Disease

## 2022-11-13 VITALS — BP 124/72 | HR 64 | Ht 62.0 in | Wt 230.0 lb

## 2022-11-13 DIAGNOSIS — E782 Mixed hyperlipidemia: Secondary | ICD-10-CM | POA: Diagnosis not present

## 2022-11-13 DIAGNOSIS — G4733 Obstructive sleep apnea (adult) (pediatric): Secondary | ICD-10-CM | POA: Insufficient documentation

## 2022-11-13 DIAGNOSIS — Z6841 Body Mass Index (BMI) 40.0 and over, adult: Secondary | ICD-10-CM | POA: Diagnosis not present

## 2022-11-13 DIAGNOSIS — I482 Chronic atrial fibrillation, unspecified: Secondary | ICD-10-CM | POA: Diagnosis not present

## 2022-11-13 DIAGNOSIS — F32A Depression, unspecified: Secondary | ICD-10-CM | POA: Insufficient documentation

## 2022-11-13 DIAGNOSIS — D6859 Other primary thrombophilia: Secondary | ICD-10-CM | POA: Diagnosis not present

## 2022-11-13 DIAGNOSIS — F419 Anxiety disorder, unspecified: Secondary | ICD-10-CM | POA: Diagnosis not present

## 2022-11-13 DIAGNOSIS — I350 Nonrheumatic aortic (valve) stenosis: Secondary | ICD-10-CM | POA: Diagnosis not present

## 2022-11-13 DIAGNOSIS — Z7901 Long term (current) use of anticoagulants: Secondary | ICD-10-CM | POA: Insufficient documentation

## 2022-11-13 DIAGNOSIS — E785 Hyperlipidemia, unspecified: Secondary | ICD-10-CM | POA: Insufficient documentation

## 2022-11-13 DIAGNOSIS — I5189 Other ill-defined heart diseases: Secondary | ICD-10-CM | POA: Insufficient documentation

## 2022-11-13 DIAGNOSIS — I10 Essential (primary) hypertension: Secondary | ICD-10-CM | POA: Diagnosis not present

## 2022-11-13 NOTE — Patient Instructions (Signed)
Medication Instructions:  Your physician recommends that you continue on your current medications as directed. Please refer to the Current Medication list given to you today.  *If you need a refill on your cardiac medications before your next appointment, please call your pharmacy*   Lab Work: NONE If you have labs (blood work) drawn today and your tests are completely normal, you will receive your results only by: [place] (if you have MyChart) OR A paper copy in the mail If you have any lab test that is abnormal or we need to change your treatment, we will call you to review the results.   Testing/Procedures: Your physician has requested that you have an echocardiogram in [facility].  Echocardiography is a painless test that uses sound waves to create images of your heart. It provides your doctor with information about the size and shape of your heart and how well your heart's chambers and valves are working. This procedure takes approximately one hour. There are no restrictions for this procedure. Please do NOT wear cologne, perfume, aftershave, or lotions (deodorant is allowed). Please arrive 15 minutes prior to your appointment time.    Follow-Up: At [facility], you and your health needs are our priority.  As part of our continuing mission to provide you with exceptional heart care, we have created designated Provider Care Teams.  These Care Teams include your primary Cardiologist (physician) and Advanced Practice Providers (APPs -  Physician Assistants and Nurse Practitioners) who all work together to provide you with the care you need, when you need it.  We recommend signing up for the patient portal called "MyChart".  Sign up information is provided on this After Visit Summary.  MyChart is used to connect with patients for Virtual Visits (Telemedicine).  Patients are able to view lab/test results, encounter notes, upcoming appointments, etc.  Non-urgent messages can be  sent to your provider as well.   To learn more about what you can do with MyChart, go to [email or web address].    Your next appointment:   4 month(s)  Provider:   Shelva Majestic, MD

## 2022-11-13 NOTE — Progress Notes (Addendum)
Cardiology Office Note    Date:  11/13/2022   ID:  Burnell, Hartl Mar 05, 1951, MRN [id number]  PCP:  [name], PA  Cardiologist:  Shelva Majestic, MD   32 month f/u cardiology office visit   History of Present Illness:  Debra Wright is a 72 y.o. female who was seen by me in the hospital in August 2019 when she presented with new onset atrial fibrillation and reverted to sinus rhythm in the emergency room after multiple rounds of IV metoprolol.  She has a history of hypertension.  She had mild chest tightness associated with a rapid rate, troponins were negative and she had nonspecific ST-T changes.    The patient was evaluated by Kerin Ransom, PA-C on January 27, 2019 in a telemedicine conference.  She has a history of hypertension, type 2 diabetes mellitus, hyperlipidemia, anxiety, and has sleep apnea which is followed by Dr. Berneice Gandy.  During her 2019 hospitalization, she converted to sinus rhythm with IV metoprolol and oral metoprolol was added to her regimen.  Amlodipine was changed to diltiazem.  An echo Doppler in the hospital demonstrated normal LV function with moderate LVH, grade 2 diastolic dysfunction, aortic valve sclerosis without stenosis, and mild to moderate left atrial enlargement.  Subsequently, her metoprolol dose had been reduced due to fatigability.  She had undergone total knee replacement in March 2020 completed physical therapy.  I saw her on September 09, 2019 and denied any awareness of recurrent AF.  Since her knee surgery she had a 20 pound weight loss but subsequently admits that she has regained approximately 12 pounds back.  She has had recent blood pressure lability.  She continues to use CPAP.    She was last evaluated in a telemedicine evaluation on March 05, 2020 and remained stable and was unaware of any recurrence of her atrial fibrillation.  He continues to be on long-term anticoagulation therapy with warfarin.  She was continuing to use CPAP followed by Dr.  Elsworth Soho.  I have not seen her I evaluated her since her telemedicine visit in 2021.   She underwent a 2D echo Doppler study on December 22, 2021 which showed hyperdynamic LV function with EF 65 to 70% with moderate concentric LVH and grade 2 diastolic dysfunction.  Pulmonary pressures were normal.  Left atrial size is moderately dilated.  There was a small circumferential pericardial effusion.  There was moderate mitral annular calcification.  She had moderate calcification of his aortic valve and was felt to have mild aortic valve stenosis with a mean gradient of 8 and peak gradient of 14.4 mmHg.   She has continued to be followed by Dr. Dwyane Dee for her diabetes mellitus and sees Dr. Elsworth Soho for her CPAP therapy/OSA.  She checks her warfarin levels at home and then calls our Coumadin clinic.  Her last laboratory on January 30 was increased at 3.1 and she was advised to reduce her warfarin and take 5 mg daily with the exception of 2.5 mg on Wednesday and Sundays.  Sees Marilynne Drivers, PA at [facility] for primary care.  Lipid studies on September 08, 2022 showed total cholesterol 137 HDL 44 LDL 60 and triglycerides were elevated at 202.  She admits to easy bruisability.  She continues to be on diltiazem CD180 mg spironolactone 12.5 mg and metoprolol titrate 25 mg twice a day for blood pressure and heart rate control.  She is diabetic on Jardiance, metformin.  She is on rosuvastatin 20 mg and omega-3 fatty  acid 1200 mg twice a day for mixed hyperlipidemia.  She is unaware of any recurrent A-fib.  She denies chest pain or shortness of breath.  She cares for her disabled husband.  She presents for cardiology evaluation.   Past Medical History:  Diagnosis Date   Anxiety    Arthritis    "knees; right thumb" (10/14/2014)   Basal cell carcinoma    "burned off upper lip & right shoulder"   Chronic anticoagulation    CHADS VASC=3   Complication of anesthesia    after spinal anesthesia for left knee replacement, bladder did "not  wake up". Was incontinent for 4 days post surgery   Depression    Fibroid tumor    Frequent diarrhea    GERD (gastroesophageal reflux disease)    High cholesterol    Hypertension    EF 65-70% grade 2DD   Insomnia    Migraine    "frequently when I was younger; maybe monthly now" (10/14/2014) - none after hysterectomy   PAF (paroxysmal atrial fibrillation) ([place]) 05/02/2018   converted with rate control   Pernicious anemia    pt not aware of this   Sleep apnea    uses a cpap   Type II diabetes mellitus ([place])    Vitamin B 12 deficiency     Past Surgical History:  Procedure Laterality Date   ABDOMINAL HYSTERECTOMY  1988   ACHILLES TENDON SURGERY Right    APPENDECTOMY  1988   BILATERAL OOPHORECTOMY  2004   CARPAL TUNNEL RELEASE Bilateral 1986   CATARACT EXTRACTION W/ INTRAOCULAR LENS  IMPLANT, BILATERAL Bilateral 2011   COLONOSCOPY     JOINT REPLACEMENT     SHOULDER ARTHROSCOPY DISTAL CLAVICLE EXCISION AND OPEN ROTATOR CUFF REPAIR Right 11/2013   TOENAIL EXCISION Right 04/2018   "big toe"   TONSILLECTOMY  1950's   TOTAL KNEE ARTHROPLASTY Left 10/13/2014   Procedure: LEFT TOTAL KNEE ARTHROPLASTY;  Surgeon: Mcarthur Rossetti, MD;  Location: [place];  Service: Orthopedics;  Laterality: Left;   TOTAL KNEE ARTHROPLASTY Right 12/10/2018   Procedure: RIGHT TOTAL KNEE ARTHROPLASTY;  Surgeon: Mcarthur Rossetti, MD;  Location: [place];  Service: Orthopedics;  Laterality: Right;    Current Medications: Outpatient Medications Prior to Visit  Medication Sig Dispense Refill   Blood Glucose Monitoring Suppl (ACCU-CHEK GUIDE ME) w/Device KIT USE AS DIRECTED.  Dx E11.9. 1 kit 11   calcium carbonate (TUMS - DOSED IN MG ELEMENTAL CALCIUM) 500 MG chewable tablet Chew 2 tablets by mouth daily as needed for indigestion or heartburn.     Calcium Carbonate-Vitamin D (CALCIUM 600 + D PO) Take 1 tablet by mouth daily.     cetirizine (ZYRTEC) 10 MG tablet Take 10 mg by mouth daily as needed for  allergies.     CINNAMON PO Take 2 tablets by mouth daily.     clonazePAM (KLONOPIN) 0.5 MG tablet TAKE 1/2 TO 1 (ONE-HALF TO ONE) TABLET BY MOUTH AT BEDTIME AS NEEDED (Patient taking differently: Take 0.25-0.5 mg by mouth daily as needed for anxiety.) 30 tablet 0   Cyanocobalamin (VITAMIN B12) 1000 MCG TBCR Take 1,000 mcg by mouth daily.     diltiazem (CARDIZEM CD) 180 MG 24 hr capsule TAKE 1 CAPSULE(180 MG) BY MOUTH DAILY 90 capsule 0   escitalopram (LEXAPRO) 10 MG tablet 1/2 tablet once a day x 1 week, then increase to 1 tablet daily     eszopiclone 3 MG TABS Take 3 mg by mouth at bedtime.  Ferrous Sulfate (IRON) 325 (65 Fe) MG TABS Take 325 mg by mouth 2 (two) times daily.     glimepiride (AMARYL) 4 MG tablet Take 1 tablet (4 mg total) by mouth daily before breakfast. 30 tablet 3   glucose blood (ACCU-CHEK GUIDE) test strip CHECK BLOOD GLUCOSE IN THE MORNING AND AT BEDTIME.  E11.9 code. 100 strip 12   JARDIANCE 10 MG TABS tablet TAKE 1 TABLET(10 MG) BY MOUTH DAILY WITH BREAKFAST 30 tablet 3   magnesium oxide (MAG-OX) 400 (241.3 Mg) MG tablet Take 400 mg by mouth daily.     metFORMIN (GLUCOPHAGE) 1000 MG tablet Take 1 tablet (1,000 mg total) by mouth 2 (two) times daily with a meal. 180 tablet 0   metoprolol tartrate (LOPRESSOR) 25 MG tablet Take 1 tablet (25 mg total) by mouth 2 (two) times daily. 180 tablet 0   MYRBETRIQ 25 MG TB24 tablet Take 25 mg by mouth daily.     Omega-3 Fatty Acids (FISH OIL) 1200 MG CAPS Take 1,200 mg by mouth 2 (two) times daily.     omeprazole (PRILOSEC) 40 MG capsule Take 1 capsule by mouth once daily 90 capsule 0   spironolactone (ALDACTONE) 25 MG tablet TAKE 1/2 TABLET BY MOUTH EVERY DAY. 45 tablet 1   tolterodine (DETROL) 2 MG tablet Take 2 mg by mouth 2 (two) times daily.     vitamin C (ASCORBIC ACID) 250 MG tablet Take 250 mg by mouth daily.     warfarin (COUMADIN) 5 MG tablet TAKE 1/2 TABLET TO 1 TABLET BY MOUTH DAILY AS DIRECTED BY COUMADIN CLINIC 75  tablet 0   cyanocobalamin (,VITAMIN B-12,) 1000 MCG/ML injection Inject 1,000 mcg into the muscle once a week. (Patient not taking: Reported on 11/13/2022)     Eszopiclone 3 MG TABS Take 1 tablet (3 mg total) by mouth at bedtime as needed. Take immediately before bedtime (Patient not taking: Reported on 11/13/2022) 30 tablet 3   fluconazole (DIFLUCAN) 150 MG tablet Take 1 tablet (150 mg total) by mouth every three (3) days as needed. (Patient not taking: Reported on 11/13/2022) 5 tablet 0   rosuvastatin (CRESTOR) 20 MG tablet Take 1 tablet (20 mg total) by mouth daily. 90 tablet 1   tiZANidine (ZANAFLEX) 2 MG tablet Take 1-2 tablets (2-4 mg total) by mouth every 6 (six) hours as needed for muscle spasms. (Patient not taking: Reported on 11/13/2022) 60 tablet 6   No facility-administered medications prior to visit.     Allergies:   Antivert [meclizine hcl], Meclizine, Trazodone hcl, Zolpidem, and Zolpidem tartrate   Social History   Socioeconomic History   Marital status: Married    Spouse name: Not on file   Number of children: 1   Years of education: 14   Highest education level: Not on file  Occupational History   Not on file  Tobacco Use   Smoking status: Never   Smokeless tobacco: Never  Vaping Use   Vaping Use: Never used  Substance and Sexual Activity   Alcohol use: Yes    Comment: 10/14/2014 "might have a drink when I'm out once/month"   Drug use: No   Sexual activity: Not Currently  Other Topics Concern   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, sports for 41   Married       Right handed    Drinks caffeine one cup of coffee daily   An apartment  Social Determinants of Health   Financial Resource Strain: Not on file  Food Insecurity: Not on file  Transportation Needs: Not on file  Physical Activity: Not on file  Stress: Not on file  Social Connections: Not on file     Family History:  The patient's family history includes Arthritis in  her father, maternal grandfather, maternal grandmother, mother, paternal grandfather, and paternal grandmother; Diabetes in her brother, father, maternal grandmother, mother, paternal grandfather, and paternal grandmother; Heart disease in her father; Hyperlipidemia in her father, maternal grandfather, maternal grandmother, mother, paternal grandfather, and paternal grandmother; Hypertension in her brother, maternal grandfather, maternal grandmother, mother, paternal grandfather, and paternal grandmother; Stroke in her brother.   ROS General: Negative; No fevers, chills, or night sweats;  HEENT: Negative; No changes in vision or hearing, sinus congestion, difficulty swallowing Pulmonary: Negative; No cough, wheezing, shortness of breath, hemoptysis Cardiovascular: HPI GI: Negative; No nausea, vomiting, diarrhea, or abdominal pain GU: Negative; No dysuria, hematuria, or difficulty voiding Musculoskeletal: Right total knee replacement Hematologic/Oncology: Negative; no easy bruising, bleeding Endocrine: + For diabetes mellitus Neuro: Negative; no changes in balance, headaches Skin: Negative; No rashes or skin lesions Psychiatric: ,depression Sleep: OSA on CPAP followed by Dr. Elsworth Soho  Other comprehensive 14 point system review is negative.   PHYSICAL EXAM:   VS:  BP 124/72 (BP Location: Left Arm, Patient Position: Sitting, Cuff Size: Large)   Pulse 64   Ht '5\' 2"'$  (1.575 m)   Wt 230 lb (104.3 kg)   SpO2 96%   BMI 42.07 kg/m     Repeat blood pressure by me 122/70  Wt Readings from Last 3 Encounters:  11/13/22 230 lb (104.3 kg)  10/16/22 230 lb (104.3 kg)  08/04/22 235 lb (106.6 kg)    General: Alert, oriented, no distress.  Skin: normal turgor, no rashes, warm and dry HEENT: Normocephalic, atraumatic. Pupils equal round and reactive to light; sclera anicteric; extraocular muscles intact;  Nose without nasal septal hypertrophy Mouth/Parynx benign; Mallinpatti scale 3/4 Neck: No JVD, no  carotid bruits; normal carotid upstroke Lungs: clear to ausculatation and percussion; no wheezing or rales Chest wall: without tenderness to palpitation Heart: PMI not displaced, irregularly irregular with ventricular rate in the 60s, s1 s2 normal, [id number] systolic murmur, no diastolic murmur, no rubs, gallops, thrills, or heaves Abdomen: soft, nontender; no hepatosplenomehaly, BS+; abdominal aorta nontender and not dilated by palpation. Back: no CVA tenderness Pulses 2+ Musculoskeletal: full range of motion, normal strength, no joint deformities Extremities: no clubbing cyanosis or edema, Homan's sign negative  Neurologic: grossly nonfocal; Cranial nerves grossly wnl Psychologic: Normal mood and affect     Studies/Labs Reviewed:   EKG:  EKG is ordered today.  ECG (independently read by me): Sinus bradycardia 54 bpm.  LVH by voltage.  Normal intervals.  No ectopy  Recent Labs:    Latest Ref Rng & Units 07/12/2022    7:06 PM 05/11/2022   12:43 PM 09/29/2021    2:17 PM  BMP  Glucose 70 - 99 mg/dL 197  371  140   BUN 8 - 23 mg/dL '9  17  7   '$ Creatinine 0.44 - 1.00 mg/dL 0.70  0.80  0.70   Sodium 135 - 145 mmol/L 139  132  137   Potassium 3.5 - 5.1 mmol/L 3.8  4.0  4.0   Chloride 98 - 111 mmol/L 106  98  100   CO2 22 - 32 mmol/L 26  22    Calcium 8.9 -  10.3 mg/dL 8.6  9.2          Latest Ref Rng & Units 07/12/2022    7:06 PM 05/11/2022   12:43 PM 11/26/2019    1:19 PM  Hepatic Function  Total Protein 6.5 - 8.1 g/dL 6.3  6.6  5.9   Albumin 3.5 - 5.0 g/dL 3.3  4.0    AST 15 - 41 U/L '23  21  12   '$ ALT 0 - 44 U/L '15  25  17   '$ Alk Phosphatase 38 - 126 U/L 52  65    Total Bilirubin 0.3 - 1.2 mg/dL 0.6  0.3  0.4        Latest Ref Rng & Units 07/12/2022    6:03 PM 09/29/2021    2:17 PM 09/29/2021   12:54 PM  CBC  WBC 4.0 - 10.5 K/uL 8.8   9.8   Hemoglobin 12.0 - 15.0 g/dL 14.2  12.6  13.2   Hematocrit 36.0 - 46.0 % 44.6  37.0  41.4   Platelets 150 - 400 K/uL 264   313    Lab  Results  Component Value Date   MCV 90.7 07/12/2022   MCV 91.4 09/29/2021   MCV 89.6 11/26/2019   Lab Results  Component Value Date   TSH 2.14 03/04/2019   Lab Results  Component Value Date   HGBA1C 8.5 (H) 05/11/2022     BNP    Component Value Date/Time   BNP 110.6 (H) 05/03/2018 0153    ProBNP No results found for: "PROBNP"   Lipid Panel     Component Value Date/Time   CHOL 146 05/11/2022 1243   TRIG 360.0 (H) 05/11/2022 1243   HDL 35.20 (L) 05/11/2022 1243   CHOLHDL 4 05/11/2022 1243   VLDL 72.0 (H) 05/11/2022 1243   LDLCALC 66 08/07/2018 1530   LDLDIRECT 82.0 05/11/2022 1243     RADIOLOGY: No results found.   Additional studies/ records that were reviewed today include:  Reviewed the patient's hospitalization from August 2019.  ------------------------------------------------------------------- ECHO 05/03/2018 Study Conclusions   - Left ventricle: The cavity size was normal. There was moderate   concentric hypertrophy. Systolic function was vigorous. The   estimated ejection fraction was in the range of 65% to 70%. Wall   motion was normal; there were no regional wall motion   abnormalities. Features are consistent with a pseudonormal left   ventricular filling pattern, with concomitant abnormal relaxation   and increased filling pressure (grade 2 diastolic dysfunction).   Doppler parameters are consistent with high ventricular filling   pressure. - Aortic valve: Trileaflet; mildly thickened, mildly calcified   leaflets. - Aorta: Aortic root dimension: 37 mm (ED). - Mitral valve: Calcified annulus. - Left atrium: The atrium was mildly to moderately dilated. - Pulmonary arteries: Systolic pressure could not be accurately   estimated.    ECHO: 12/22/2021 1. Left ventricular ejection fraction, by estimation, is 65 to 70%. The  left ventricle has normal function. The left ventricle has no regional  wall motion abnormalities. There is moderate  concentric left ventricular  hypertrophy. Left ventricular  diastolic parameters are consistent with Grade II diastolic dysfunction  (pseudonormalization).   2. Right ventricular systolic function is normal. The right ventricular  size is mildly enlarged. There is normal pulmonary artery systolic  pressure. The estimated right ventricular systolic pressure is 99991111 mmHg.   3. Left atrial size was moderately dilated.   4. A small pericardial effusion is present. The pericardial effusion  is  circumferential. There is no evidence of cardiac tamponade.   5. The mitral valve is grossly normal. Trivial mitral valve  regurgitation. No evidence of mitral stenosis. Moderate mitral annular  calcification.   6. The aortic valve was not well visualized. There is moderate  calcification of the aortic valve. There is moderate thickening of the  aortic valve. Aortic valve regurgitation is not visualized. Mild aortic  valve stenosis.   7. Aortic dilatation noted. Aneurysm of the ascending aorta, measuring 40  mm.   8. The inferior vena cava is dilated in size with >50% respiratory  variability, suggesting right atrial pressure of 8 mmHg.   Comparison(s): Changes from prior study are noted. EF 65%, moderate LVH,  AOR 52m.   Conclusion(s)/Recommendation(s): Ascending aorta measures 40 mm on current  study, otherwise no significant changes.     ASSESSMENT:    1. Chronic atrial fibrillation ([place]   2. Chronic anticoagulation   3. Essential hypertension   4. Hypercoagulable state ([place]   5. Hyperlipidemia LDL goal <70   6. Mixed hyperlipidemia   7. OSA (obstructive sleep apnea)   8. Grade II diastolic dysfunction   9. Mild aortic stenosis   10. Morbid obesity with BMI of 40.0-44.9, adult ([place]   11. Anxiety and depression     PLAN:  Ms. NAfsana"Diane " FGorteris a 72year-old female who has a history of diabetes mellitus since age [age]as well as a history of hypertension, hyperlipidemia,  obstructive sleep apnea and anxiety. She developed an new onset episode of atrial fibrillation with RVR leading to hospitalization in August 2019 at which time she converted successfully with IV metoprolol to sinus rhythm.  Only, she has been without recurrent awareness of arrhythmia.  Evaluation with me in December 2020 and her telemedicine visit in June 2021 she was maintaining sinus rhythm.  Has had issues with blood pressure and she currently is on a regimen of diltiazem CD1 80 mg, metoprolol tartrate 25 mg twice a day and spironolactone 12.5 mg daily.  Blood pressure today is stable and on repeat by me 122/70.  There is no leg edema.  However, on exam she is in an irregularly irregular rhythm distant with atrial fibrillation documented on ECG showing a ventricular rate controlled in the 60s.  She is on warfarin for anticoagulation and recent bleed this was supratherapeutic leading to dose reduction to 5 mg daily with the exception of 2.5 mg on Wednesday and Sundays.  She is scheduled for follow-up pro time next week.  She has mixed hyperlipidemia and laboratory in December 2023 showed triglycerides elevated at 202.  She has been on rosuvastatin 20 mg in addition to omega-3 fatty acid.  She is diabetic followed by Dr. KDwyane Deeon JSheltonin addition to metformin and glimepiride.  She will be undergoing repeat laboratory next week with Dr. KDwyane Dee  She continues to be on Lexapro for depression.  She has been taking Lunesta for sleep.  She continues to use CPAP and admits to excellent compliance.  Has had issues with weight gain and admits to a 10 pound weight gain possibly over the past week.  On exam she has significant bruising resulting from banging her arms getting things out of her cabinets with her recent supratherapeutic INR.  I am recommending she undergo a follow-up echo Doppler study in light of her atrial fibrillation.  She is unaware of when she went into atrial fibrillation but it is possible that this  could be  long duration.  Reassess her mild aortic stenosis did on prior echo as well as small pericardial effusion.  I will schedule her echo to be done in March.  I will see her in 3 to 4 months for follow-up evaluation.    Medication Adjustments/Labs and Tests Ordered: Current medicines are reviewed at length with the patient today.  Concerns regarding medicines are outlined above.  Medication changes, Labs and Tests ordered today are listed in the Patient Instructions below. Patient Instructions  Medication Instructions:  Your physician recommends that you continue on your current medications as directed. Please refer to the Current Medication list given to you today.  *If you need a refill on your cardiac medications before your next appointment, please call your pharmacy*   Lab Work: NONE If you have labs (blood work) drawn today and your tests are completely normal, you will receive your results only by: [place] (if you have MyChart) OR A paper copy in the mail If you have any lab test that is abnormal or we need to change your treatment, we will call you to review the results.   Testing/Procedures: Your physician has requested that you have an echocardiogram in [facility].  Echocardiography is a painless test that uses sound waves to create images of your heart. It provides your doctor with information about the size and shape of your heart and how well your heart's chambers and valves are working. This procedure takes approximately one hour. There are no restrictions for this procedure. Please do NOT wear cologne, perfume, aftershave, or lotions (deodorant is allowed). Please arrive 15 minutes prior to your appointment time.    Follow-Up: At [facility], you and your health needs are our priority.  As part of our continuing mission to provide you with exceptional heart care, we have created designated Provider Care Teams.  These Care Teams include your primary  Cardiologist (physician) and Advanced Practice Providers (APPs -  Physician Assistants and Nurse Practitioners) who all work together to provide you with the care you need, when you need it.  We recommend signing up for the patient portal called "MyChart".  Sign up information is provided on this After Visit Summary.  MyChart is used to connect with patients for Virtual Visits (Telemedicine).  Patients are able to view lab/test results, encounter notes, upcoming appointments, etc.  Non-urgent messages can be sent to your provider as well.   To learn more about what you can do with MyChart, go to [email or web address].    Your next appointment:   4 month(s)  Provider:   Shelva Majestic, MD     Signed, Shelva Majestic, MD  11/13/2022 5:45 PM    [facility] [address] [place], [place], [place]  [address] Phone: [phone]

## 2022-11-14 ENCOUNTER — Telehealth: Payer: Self-pay | Admitting: Cardiovascular Disease

## 2022-11-14 ENCOUNTER — Ambulatory Visit (INDEPENDENT_AMBULATORY_CARE_PROVIDER_SITE_OTHER): Payer: Medicare Other | Admitting: *Deleted

## 2022-11-14 DIAGNOSIS — Z7901 Long term (current) use of anticoagulants: Secondary | ICD-10-CM | POA: Diagnosis not present

## 2022-11-14 DIAGNOSIS — I480 Paroxysmal atrial fibrillation: Secondary | ICD-10-CM | POA: Diagnosis not present

## 2022-11-14 LAB — POCT INR: INR: 1.9 — AB (ref 2.0–3.0)

## 2022-11-14 NOTE — Telephone Encounter (Signed)
Pt c/o medication issue:  1. Name of Medication:   warfarin (COUMADIN) 5 MG tablet   2. How are you currently taking this medication (dosage and times per day)?   As prescribed  3. Are you having a reaction (difficulty breathing--STAT)?   4. What is your medication issue?   Caller stated they will need approval to switch manufacturer of this medication - now using [employer].

## 2022-11-14 NOTE — Telephone Encounter (Signed)
Returned call back to [employer] regarding manufacturer change. Advised we will note and will monitor INR closely.

## 2022-11-14 NOTE — Patient Instructions (Signed)
Description   Spoke with patient advised to take 1.5  tablets of warfarin today then continue taking warfarin 1 tablet (50m) daily except 1/2 tablet (2.567m each Sunday and Wednesday.  Stay consistent with greens (3 per week).   Recheck INR 1 week after procedure (back injection 11/30/22).  Anticoagulation Clinic [phone]

## 2022-11-15 ENCOUNTER — Other Ambulatory Visit (INDEPENDENT_AMBULATORY_CARE_PROVIDER_SITE_OTHER): Payer: Medicare Other

## 2022-11-15 DIAGNOSIS — E1165 Type 2 diabetes mellitus with hyperglycemia: Secondary | ICD-10-CM | POA: Diagnosis not present

## 2022-11-15 LAB — BASIC METABOLIC PANEL
BUN: 14 mg/dL (ref 6–23)
CO2: 30 mEq/L (ref 19–32)
Calcium: 9.2 mg/dL (ref 8.4–10.5)
Chloride: 102 mEq/L (ref 96–112)
Creatinine, Ser: 0.58 mg/dL (ref 0.40–1.20)
GFR: 89.45 mL/min (ref >60.00)
Glucose, Bld: 146 mg/dL — ABNORMAL HIGH (ref 70–99)
Potassium: 4.3 mEq/L (ref 3.5–5.1)
Sodium: 136 mEq/L (ref 135–145)

## 2022-11-15 LAB — HEMOGLOBIN A1C: Hgb A1c MFr Bld: 8.1 % — ABNORMAL HIGH (ref 4.6–6.5)

## 2022-11-17 NOTE — Telephone Encounter (Signed)
Will send to pre op app to review and see if the pt has been cleared by Dr. Claiborne Billings

## 2022-11-17 NOTE — Telephone Encounter (Signed)
Dr. Claiborne Billings You saw this patient on 11/13/22 for follow up and for preop clearance. Can you please address in your note and I will fax to the requesting office?  Lumbar spine L5-S1 ESI  Coumadin hold addressed by pharmD.

## 2022-11-17 NOTE — Telephone Encounter (Signed)
Office is calling back for update. Please advise

## 2022-11-20 ENCOUNTER — Ambulatory Visit: Payer: Medicare Other | Admitting: Dietician

## 2022-11-20 ENCOUNTER — Ambulatory Visit: Payer: PRIVATE HEALTH INSURANCE | Admitting: Endocrinology

## 2022-11-21 ENCOUNTER — Encounter: Payer: Self-pay | Admitting: Cardiovascular Disease

## 2022-11-21 NOTE — Telephone Encounter (Signed)
Error

## 2022-11-21 NOTE — Telephone Encounter (Signed)
I called Debra Wright back with requesting office. I left message that I had not called her in regard to the pt. I did leave message though that we are awaiting input from Dr. Claiborne Billings as he just saw the pt. Once we have clearance complete we will fax notes over.

## 2022-11-21 NOTE — Telephone Encounter (Signed)
Bianca with [facility] is calling stating she is returning a call she received regarding this patient's clearance. Please advise.

## 2022-11-22 NOTE — Telephone Encounter (Signed)
I will send to pre op APP to see if Dr. Claiborne Billings has given clearance. , see notes.

## 2022-11-24 NOTE — Telephone Encounter (Signed)
OK for surgery bu t will need to hold warfarin per pharmacy recommendations

## 2022-11-27 ENCOUNTER — Encounter: Payer: Self-pay | Admitting: Endocrinology

## 2022-11-27 NOTE — Telephone Encounter (Signed)
   Patient Name: Debra Wright  DOB: 1951-04-21 MRN: [id number]  Primary Cardiologist: Shelva Majestic, MD  Chart reviewed as part of pre-operative protocol coverage.   Per Dr. Evette Georges review, patient may proceed with procedure as requested.  Per pharmD review, per office protocol, patient can hold warfarin for 5 days prior to procedure. Patient will not need bridging with Lovenox (enoxaparin) around procedure.   Will route this bundled recommendation to requesting provider via Epic fax function. Please call with questions.   Charlie Pitter, PA-C 11/27/2022, 8:44 AM

## 2022-11-28 ENCOUNTER — Telehealth: Payer: Self-pay | Admitting: *Deleted

## 2022-11-28 NOTE — Telephone Encounter (Signed)
Pt called and stated that she was suppose to have a back injection on 2/29 and was suppose to hold warfarin 5 days prior. However, the procedure got pushed back until 3/11 and pt never held her warfarin.   Pt stated she did not check her INR today and was suppose to recheck it 3/6.   Instructed pt to check her INR on 3/6 and that she would need to start holding her warfarin that day, to hold 5 days prior to procedure. Procedure 3/11.  Please see clearance note from 10/31/2022

## 2022-12-06 ENCOUNTER — Ambulatory Visit (INDEPENDENT_AMBULATORY_CARE_PROVIDER_SITE_OTHER): Payer: Medicare Other | Admitting: *Deleted

## 2022-12-06 ENCOUNTER — Telehealth: Payer: Self-pay | Admitting: *Deleted

## 2022-12-06 DIAGNOSIS — I480 Paroxysmal atrial fibrillation: Secondary | ICD-10-CM | POA: Diagnosis not present

## 2022-12-06 DIAGNOSIS — Z7901 Long term (current) use of anticoagulants: Secondary | ICD-10-CM | POA: Diagnosis not present

## 2022-12-06 LAB — POCT INR: INR: 2.3 (ref 2.0–3.0)

## 2022-12-06 NOTE — Telephone Encounter (Signed)
Called pt since her INR monitoring is due today. There was no answer therefore left a message for her to call back regarding this.  Also, pt is pending a procedure on 12/11/22 per 11/28/22 note & can hold 5 days. Clearance note started on 10/31/22 and final decision on 11/27/22 it states: Per pharmD review, per office protocol, patient can hold warfarin for 5 days prior to procedure. Patient will not need bridging with Lovenox (enoxaparin) around procedure.

## 2022-12-06 NOTE — Patient Instructions (Signed)
Description   Spoke with patient advised to take continue holding warfarin for upcoming procedure on 12/11/22 then continue taking warfarin 1 tablet ('5mg'$ ) daily except 1/2 tablet (2.'5mg'$ ) each Sunday and Wednesday. Stay consistent with greens (3 per week).   Recheck INR 1 week after procedure (back injection 12/11/22).  Anticoagulation Clinic [phone]    Last dose was yesterday for upcoming procedure. Then will resume normal dose post procedure when instructed to do so by Physician

## 2022-12-07 ENCOUNTER — Encounter: Payer: Self-pay | Admitting: Radiology

## 2022-12-08 ENCOUNTER — Ambulatory Visit (HOSPITAL_COMMUNITY): Payer: Medicare Other | Attending: Cardiovascular Disease

## 2022-12-08 DIAGNOSIS — I482 Chronic atrial fibrillation, unspecified: Secondary | ICD-10-CM | POA: Insufficient documentation

## 2022-12-08 LAB — ECHOCARDIOGRAM COMPLETE
AR max vel: 1.54 cm2
AV Area VTI: 1.66 cm2
AV Area mean vel: 1.4 cm2
AV Mean grad: 7.4 mmHg
AV Peak grad: 12.8 mmHg
Ao pk vel: 1.79 m/s
S' Lateral: 2.8 cm

## 2022-12-11 DIAGNOSIS — M5416 Radiculopathy, lumbar region: Secondary | ICD-10-CM | POA: Diagnosis not present

## 2022-12-18 ENCOUNTER — Ambulatory Visit (INDEPENDENT_AMBULATORY_CARE_PROVIDER_SITE_OTHER): Payer: Medicare Other | Admitting: *Deleted

## 2022-12-18 DIAGNOSIS — Z7901 Long term (current) use of anticoagulants: Secondary | ICD-10-CM

## 2022-12-18 DIAGNOSIS — I480 Paroxysmal atrial fibrillation: Secondary | ICD-10-CM | POA: Diagnosis not present

## 2022-12-18 LAB — POCT INR: INR: 3.3 — AB (ref 2.0–3.0)

## 2022-12-18 NOTE — Patient Instructions (Addendum)
Description   Spoke with patient advised to not take today's dose of warfarin then continue taking warfarin 1 tablet (5mg ) daily except 1/2 tablet (2.5mg ) each Sunday and Wednesday. Stay consistent with greens (3 per week).   Recheck INR 1 week (normally 2 weeks). Anticoagulation Clinic [phone]

## 2022-12-24 ENCOUNTER — Encounter: Payer: Self-pay | Admitting: Endocrinology

## 2022-12-25 ENCOUNTER — Ambulatory Visit (INDEPENDENT_AMBULATORY_CARE_PROVIDER_SITE_OTHER): Payer: Medicare Other | Admitting: Pharmacist

## 2022-12-25 DIAGNOSIS — I480 Paroxysmal atrial fibrillation: Secondary | ICD-10-CM

## 2022-12-25 DIAGNOSIS — Z7901 Long term (current) use of anticoagulants: Secondary | ICD-10-CM

## 2022-12-25 DIAGNOSIS — Z1231 Encounter for screening mammogram for malignant neoplasm of breast: Secondary | ICD-10-CM | POA: Diagnosis not present

## 2022-12-25 LAB — POCT INR: INR: 2.6 (ref 2.0–3.0)

## 2022-12-25 NOTE — Patient Instructions (Signed)
Description   Spoke with patient and advised to continue taking warfarin 1 tablet (5mg ) daily except 1/2 tablet (2.5mg ) each Sunday and Wednesday. Stay consistent with greens (3 per week).   Recheck INR 2 weeks.  Anticoagulation Clinic [phone]

## 2022-12-26 ENCOUNTER — Other Ambulatory Visit: Payer: Self-pay

## 2022-12-26 ENCOUNTER — Encounter: Payer: Self-pay | Admitting: Endocrinology

## 2022-12-26 MED ORDER — EMPAGLIFLOZIN 10 MG PO TABS: ORAL_TABLET | ORAL | 0 refills | Status: DC

## 2023-01-09 ENCOUNTER — Ambulatory Visit (INDEPENDENT_AMBULATORY_CARE_PROVIDER_SITE_OTHER): Payer: Medicare Other

## 2023-01-09 DIAGNOSIS — Z7901 Long term (current) use of anticoagulants: Secondary | ICD-10-CM | POA: Diagnosis not present

## 2023-01-09 DIAGNOSIS — I480 Paroxysmal atrial fibrillation: Secondary | ICD-10-CM | POA: Diagnosis not present

## 2023-01-09 LAB — POCT INR: INR: 5.9 — AB (ref 2.0–3.0)

## 2023-01-09 NOTE — Patient Instructions (Signed)
Description   Spoke with patient and advised to skip today and tomorrow's dosage of Warfarin, then start taking Warfarin 1 tablet (5mg ) daily except 1/2 tablet (2.5mg ) on Sundays, Wednesdays and Fridays. Stay consistent with greens (3 per week).   Recheck INR 1 week (usually 2 week recheck).  Anticoagulation Clinic [phone]

## 2023-01-11 ENCOUNTER — Encounter: Payer: Self-pay | Admitting: Endocrinology

## 2023-01-16 ENCOUNTER — Ambulatory Visit (INDEPENDENT_AMBULATORY_CARE_PROVIDER_SITE_OTHER): Payer: Medicare Other

## 2023-01-16 DIAGNOSIS — Z7901 Long term (current) use of anticoagulants: Secondary | ICD-10-CM | POA: Diagnosis not present

## 2023-01-16 DIAGNOSIS — I480 Paroxysmal atrial fibrillation: Secondary | ICD-10-CM

## 2023-01-16 LAB — POCT INR
INR: 2.6 (ref 2.0–3.0)
INR: 2.6 (ref 2.0–3.0)

## 2023-01-16 NOTE — Patient Instructions (Signed)
Description   Spoke with patient and advised to continue on same dosage of Warfarin 1 tablet ( ) daily except 1/2 tablet (2.5mg ) on Sundays, Wednesdays and Fridays. Stay consistent with greens (3 per week).   Recheck INR 2 weeks.  Anticoagulation Clinic [phone]

## 2023-01-21 ENCOUNTER — Encounter: Payer: Self-pay | Admitting: Endocrinology

## 2023-01-21 ENCOUNTER — Other Ambulatory Visit: Payer: Self-pay | Admitting: Endocrinology

## 2023-01-22 ENCOUNTER — Other Ambulatory Visit: Payer: Self-pay

## 2023-01-22 MED ORDER — EMPAGLIFLOZIN 10 MG PO TABS: ORAL_TABLET | ORAL | 0 refills | Status: DC

## 2023-01-25 ENCOUNTER — Encounter (INDEPENDENT_AMBULATORY_CARE_PROVIDER_SITE_OTHER): Payer: Self-pay | Admitting: *Deleted

## 2023-01-25 DIAGNOSIS — I480 Paroxysmal atrial fibrillation: Secondary | ICD-10-CM

## 2023-01-25 DIAGNOSIS — Z7901 Long term (current) use of anticoagulants: Secondary | ICD-10-CM

## 2023-01-25 NOTE — Progress Notes (Signed)
This encounter was created in error - please disregard.

## 2023-01-30 ENCOUNTER — Ambulatory Visit (INDEPENDENT_AMBULATORY_CARE_PROVIDER_SITE_OTHER): Payer: Medicare Other | Admitting: *Deleted

## 2023-01-30 ENCOUNTER — Other Ambulatory Visit: Payer: Self-pay | Admitting: Endocrinology

## 2023-01-30 DIAGNOSIS — E1165 Type 2 diabetes mellitus with hyperglycemia: Secondary | ICD-10-CM

## 2023-01-30 DIAGNOSIS — Z7901 Long term (current) use of anticoagulants: Secondary | ICD-10-CM

## 2023-01-30 DIAGNOSIS — I480 Paroxysmal atrial fibrillation: Secondary | ICD-10-CM

## 2023-01-30 LAB — POCT INR: INR: 2.7 (ref 2.0–3.0)

## 2023-01-30 NOTE — Patient Instructions (Signed)
Description   Spoke with patient and advised to continue taking Warfarin 1 tablet (5mg ) daily except 1/2 tablet (2.5mg ) on Sundays, Wednesdays and Fridays. Stay consistent with greens (3 per week).   Recheck INR 2 weeks.  Anticoagulation Clinic [phone]

## 2023-02-01 ENCOUNTER — Other Ambulatory Visit (INDEPENDENT_AMBULATORY_CARE_PROVIDER_SITE_OTHER): Payer: Medicare Other

## 2023-02-01 DIAGNOSIS — E1165 Type 2 diabetes mellitus with hyperglycemia: Secondary | ICD-10-CM | POA: Diagnosis not present

## 2023-02-01 LAB — BASIC METABOLIC PANEL
BUN: 14 mg/dL (ref 6–23)
CO2: 28 mEq/L (ref 19–32)
Calcium: 9.2 mg/dL (ref 8.4–10.5)
Chloride: 102 mEq/L (ref 96–112)
Creatinine, Ser: 0.75 mg/dL (ref 0.40–1.20)
GFR: 78.58 mL/min (ref >60.00)
Glucose, Bld: 131 mg/dL — ABNORMAL HIGH (ref 70–99)
Potassium: 4.1 mEq/L (ref 3.5–5.1)
Sodium: 140 mEq/L (ref 135–145)

## 2023-02-01 LAB — HEMOGLOBIN A1C: Hgb A1c MFr Bld: 7.7 % — ABNORMAL HIGH (ref 4.6–6.5)

## 2023-02-05 ENCOUNTER — Encounter: Payer: Self-pay | Admitting: "Endocrinology

## 2023-02-05 ENCOUNTER — Ambulatory Visit (INDEPENDENT_AMBULATORY_CARE_PROVIDER_SITE_OTHER): Payer: Medicare Other | Admitting: "Endocrinology

## 2023-02-05 VITALS — BP 130/70 | HR 65 | Ht 62.0 in | Wt 242.6 lb

## 2023-02-05 DIAGNOSIS — E782 Mixed hyperlipidemia: Secondary | ICD-10-CM | POA: Diagnosis not present

## 2023-02-05 DIAGNOSIS — Z7984 Long term (current) use of oral hypoglycemic drugs: Secondary | ICD-10-CM | POA: Diagnosis not present

## 2023-02-05 DIAGNOSIS — E871 Hypo-osmolality and hyponatremia: Secondary | ICD-10-CM | POA: Diagnosis not present

## 2023-02-05 DIAGNOSIS — E1140 Type 2 diabetes mellitus with diabetic neuropathy, unspecified: Secondary | ICD-10-CM

## 2023-02-05 MED ORDER — EMPAGLIFLOZIN 25 MG PO TABS: 25.0000 mg | ORAL_TABLET | Freq: Every day | ORAL | 1 refills | Status: DC

## 2023-02-05 MED ORDER — GABAPENTIN 100 MG PO CAPS: 100.0000 mg | ORAL_CAPSULE | ORAL | 0 refills | Status: DC | PRN

## 2023-02-05 NOTE — Progress Notes (Signed)
Outpatient Endocrinology Note Debra Machias, MD    Debra Wright [birth year] [id number]  Referring Provider: Wilfrid Lund, PA Primary Care Provider: Wilfrid Lund, PA Reason for consultation: Subjective  Type 2 diabetes mellitus  Assessment & Plan  Diagnoses and all orders for this visit:  Type 2 diabetes mellitus with diabetic neuropathy, without long-term current use of insulin (HCC)  Mixed hypercholesterolemia and hypertriglyceridemia  Hyponatremia  Other orders -     empagliflozin (JARDIANCE) 25 MG TABS tablet; Take 1 tablet (25 mg total) by mouth daily before breakfast. -     gabapentin (NEURONTIN) 100 MG capsule; Take 1 capsule (100 mg total) by mouth as needed (neuropathic pain).    Diabetes complicated by neuropathy Hba1c goal less than 7.5, current Hba1c is 7.7 Will recommend for the following change of medications to: Metformin 1000 mg twice daily Jardiance 25 mg qd Glimepiride 4 mg qd with meal (hold if BG lo/not eating well) Ozempic not covered-will discuss other GLP1 options with prior auth team No history of MEN syndrome/medullary thyroid cancer/pancreatitis or pancreatic cancer in self or family  Start gabapentin 100 mg qd upto 3 pills PRN neuropathic burning pain  Check BG alternating times of the day and bring log  No known contraindications to any of above medications  Hyperlipidemia -Last LDL near goal: 82 -on rosuvastatin 20 mg QD -Follow low fat diet and exercise   -Blood pressure goal <140/90 - Microalbumin/creatinine at goal < 30 -not on ACE/ARB -diet changes including salt restriction -limit eating outside -counseled BP targets per standards of diabetes care -Uncontrolled blood pressure can lead to retinopathy, nephropathy and cardiovascular and atherosclerotic heart disease  -Na low at 131, new finding  Speak to PCP about hyponatremia, pt is every other day spirolactone 25 mg  Pt understands and agreed  Reviewed and  counseled on: -A1C target -Blood sugar targets -Complications of uncontrolled diabetes  -Checking blood sugar before meals and bedtime and bring log next visit -All medications with mechanism of action and side effects -Hypoglycemia management: rule of 15's, Glucagon Emergency Kit and medical alert ID -low-carb low-fat plate-method diet -At least 20 minutes of physical activity per day -Annual dilated retinal eye exam and foot exam -compliance and follow up needs -follow up as scheduled or earlier if problem gets worse  Call if blood sugar is less than 70 or consistently above 250    Take a 15 gm snack of carbohydrate at bedtime before you go to sleep if your blood sugar is less than 100.    If you are going to fast after midnight for a test or procedure, ask your physician for instructions on how to reduce/decrease your insulin dose.    Call if blood sugar is less than 70 or consistently above 250  -Treating a low sugar by rule of 15  (15 gms of sugar every 15 min until sugar is more than 70) If you feel your sugar is low, test your sugar to be sure If your sugar is low (less than 70), then take 15 grams of a fast acting Carbohydrate (3-4 glucose tablets or glucose gel or 4 ounces of juice or regular soda) Recheck your sugar 15 min after treating low to make sure it is more than 70 If sugar is still less than 70, treat again with 15 grams of carbohydrate          Don't drive the hour of hypoglycemia  If unconscious/unable to eat or drink by  mouth, use glucagon injection or nasal spray baqsimi and call 911. Can repeat again in 15 min if still unconscious.  Return in about 3 months (around 05/08/2023).   I spent more than 50% of today's visit counseling patient on symptoms, examination findings, lab findings, imaging results, treatment decisions and monitoring and prognosis. The patient understood the recommendations and agrees with the treatment plan. All questions regarding treatment  plan were fully answered.  Debra [place], MD  02/05/23    History of Present Illness Debra Wright is a 72 y.o. year old female who presents for evaluation of Type 2 diabetes mellitus.  Debra Wright was first diagnosed in 2014.   Diabetes education +  Home diabetes regimen: Metformin 1000 mg twice daily Jardiance 10 mg qd Glimepiride 4 mg qd   Non-insulin hypoglycemic drugs previously used: Metformin, glipizide, repaglinide 1 mg since 3/22 (stopped in 05/2022), Januvia  COMPLICATIONS -  MI/Stroke -  retinopathy, last eye exam 2024 +  neuropathy -  nephropathy  BLOOD SUGAR DATA 96-132 fasting per pt, did not bring meter Checks fasting +-before supper  Physical Exam  BP 130/70 (BP Location: Left Arm, Patient Position: Sitting, Cuff Size: Normal)   Pulse 65   Ht 5\' 2"  (1.575 m)   Wt 242 lb 9.6 oz (110 kg)   SpO2 96%   BMI 44.37 kg/m    Constitutional: well developed, well nourished Head: normocephalic, atraumatic Eyes: sclera anicteric, no redness Neck: supple Lungs: normal respiratory effort Neurology: alert and oriented Skin: dry, bruises all over body (on warfarin for A.fib) Musculoskeletal: no appreciable defects Psychiatric: normal mood and affect Diabetic Foot Exam - Simple   Simple Foot Form Diabetic Foot exam was performed with the following findings: Yes 02/05/2023  2:23 PM  Visual Inspection No deformities, no ulcerations, no other skin breakdown bilaterally: Yes Sensation Testing Intact to touch and monofilament testing bilaterally: Yes Pulse Check Posterior Tibialis and Dorsalis pulse intact bilaterally: Yes Comments R foot 2nd and third toe callus-sees foot doctor and gets them shaved       Current Medications Patient's Medications  New Prescriptions   EMPAGLIFLOZIN (JARDIANCE) 25 MG TABS TABLET    Take 1 tablet (25 mg total) by mouth daily before breakfast.   GABAPENTIN (NEURONTIN) 100 MG CAPSULE    Take 1 capsule (100 mg total) by mouth  as needed (neuropathic pain).  Previous Medications   BLOOD GLUCOSE MONITORING SUPPL (ACCU-CHEK GUIDE ME) W/DEVICE KIT    USE AS DIRECTED.  Dx E11.9.   CALCIUM CARBONATE (TUMS - DOSED IN MG ELEMENTAL CALCIUM) 500 MG CHEWABLE TABLET    Chew 2 tablets by mouth daily as needed for indigestion or heartburn.   CALCIUM CARBONATE-VITAMIN D (CALCIUM 600 + D PO)    Take 1 tablet by mouth daily.   CETIRIZINE (ZYRTEC) 10 MG TABLET    Take 10 mg by mouth daily as needed for allergies.   CINNAMON PO    Take 2 tablets by mouth daily.   CLONAZEPAM (KLONOPIN) 0.5 MG TABLET    TAKE 1/2 TO 1 (ONE-HALF TO ONE) TABLET BY MOUTH AT BEDTIME AS NEEDED   CYANOCOBALAMIN (,VITAMIN B-12,) 1000 MCG/ML INJECTION    Inject 1,000 mcg into the muscle once a week.   CYANOCOBALAMIN (VITAMIN B12) 1000 MCG TBCR    Take 1,000 mcg by mouth daily.   DILTIAZEM (CARDIZEM CD) 180 MG 24 HR CAPSULE    TAKE 1 CAPSULE(180 MG) BY MOUTH DAILY   ESCITALOPRAM (LEXAPRO) 10 MG  TABLET    1/2 tablet once a day x 1 week, then increase to 1 tablet daily   ESZOPICLONE 3 MG TABS    Take 1 tablet (3 mg total) by mouth at bedtime as needed. Take immediately before bedtime   ESZOPICLONE 3 MG TABS    Take 3 mg by mouth at bedtime.   FERROUS SULFATE (IRON) 325 (65 FE) MG TABS    Take 325 mg by mouth 2 (two) times daily.   FLUCONAZOLE (DIFLUCAN) 150 MG TABLET    Take 1 tablet (150 mg total) by mouth every three (3) days as needed.   GLIMEPIRIDE (AMARYL) 4 MG TABLET    Take 1 tablet (4 mg total) by mouth daily before breakfast.   GLUCOSE BLOOD (ACCU-CHEK GUIDE) TEST STRIP    CHECK BLOOD GLUCOSE IN THE MORNING AND AT BEDTIME.  E11.9 code.   MAGNESIUM OXIDE (MAG-OX) 400 (241.3 MG) MG TABLET    Take 400 mg by mouth daily.   METFORMIN (GLUCOPHAGE) 1000 MG TABLET    Take 1 tablet (1,000 mg total) by mouth 2 (two) times daily with a meal.   METOPROLOL TARTRATE (LOPRESSOR) 25 MG TABLET    Take 1 tablet (25 mg total) by mouth 2 (two) times daily.   MYRBETRIQ 25 MG  TB24 TABLET    Take 25 mg by mouth daily.   OMEGA-3 FATTY ACIDS (FISH OIL) 1200 MG CAPS    Take 1,200 mg by mouth 2 (two) times daily.   OMEPRAZOLE (PRILOSEC) 40 MG CAPSULE    Take 1 capsule by mouth once daily   ROSUVASTATIN (CRESTOR) 20 MG TABLET    Take 1 tablet (20 mg total) by mouth daily.   SPIRONOLACTONE (ALDACTONE) 25 MG TABLET    TAKE 1/2 TABLET BY MOUTH EVERY DAY.   TOLTERODINE (DETROL) 2 MG TABLET    Take 2 mg by mouth 2 (two) times daily.   VITAMIN C (ASCORBIC ACID) 250 MG TABLET    Take 250 mg by mouth daily.   WARFARIN (COUMADIN) 5 MG TABLET    TAKE 1/2 TABLET TO 1 TABLET BY MOUTH DAILY AS DIRECTED BY COUMADIN CLINIC  Modified Medications   No medications on file  Discontinued Medications   EMPAGLIFLOZIN (JARDIANCE) 10 MG TABS TABLET    TAKE 1 TABLET(10 MG) BY MOUTH DAILY WITH BREAKFAST    Allergies Allergies  Allergen Reactions   Antivert [Meclizine Hcl] Other (See Comments)    "makes me feel like my skin is falling off".  03/05/20 pt states she was placed on this a few months back and did not have a reaction   Meclizine Other (See Comments)    "makes me feel like my skin is falling off".  03/05/20 pt states she was placed on this a few months back and did not have a reaction Other reaction(s): jumping out of skin, jittery   Trazodone Hcl Other (See Comments)    Other reaction(s): nightmares   Zolpidem Other (See Comments)    Reverse affect   Zolpidem Tartrate     Other reaction(s): doing things in sleep unaware    Past Medical History Past Medical History:  Diagnosis Date   Anxiety    Arthritis    "knees; right thumb" (10/14/2014)   Basal cell carcinoma    "burned off upper lip & right shoulder"   Chronic anticoagulation    CHADS VASC=3   Complication of anesthesia    after spinal anesthesia for left knee replacement, bladder did "not wake  up". Was incontinent for 4 days post surgery   Depression    Fibroid tumor    Frequent diarrhea    GERD  (gastroesophageal reflux disease)    High cholesterol    Hypertension    EF 65-70% grade 2DD   Insomnia    Migraine    "frequently when I was younger; maybe monthly now" (10/14/2014) - none after hysterectomy   PAF (paroxysmal atrial fibrillation) (HCC) 05/02/2018   converted with rate control   Pernicious anemia    pt not aware of this   Sleep apnea    uses a cpap   Type II diabetes mellitus (HCC)    Vitamin B 12 deficiency     Past Surgical History Past Surgical History:  Procedure Laterality Date   ABDOMINAL HYSTERECTOMY  1988   ACHILLES TENDON SURGERY Right    APPENDECTOMY  1988   BILATERAL OOPHORECTOMY  2004   CARPAL TUNNEL RELEASE Bilateral 1986   CATARACT EXTRACTION W/ INTRAOCULAR LENS  IMPLANT, BILATERAL Bilateral 2011   COLONOSCOPY     JOINT REPLACEMENT     SHOULDER ARTHROSCOPY DISTAL CLAVICLE EXCISION AND OPEN ROTATOR CUFF REPAIR Right 11/2013   TOENAIL EXCISION Right 04/2018   "big toe"   TONSILLECTOMY  1950's   TOTAL KNEE ARTHROPLASTY Left 10/13/2014   Procedure: LEFT TOTAL KNEE ARTHROPLASTY;  Surgeon: Kathryne Hitch, MD;  Location: [facility] OR;  Service: Orthopedics;  Laterality: Left;   TOTAL KNEE ARTHROPLASTY Right 12/10/2018   Procedure: RIGHT TOTAL KNEE ARTHROPLASTY;  Surgeon: Kathryne Hitch, MD;  Location: [facility] OR;  Service: Orthopedics;  Laterality: Right;    Family History family history includes Arthritis in her father, maternal grandfather, maternal grandmother, mother, paternal grandfather, and paternal grandmother; Diabetes in her brother, father, maternal grandmother, mother, paternal grandfather, and paternal grandmother; Heart disease in her father; Hyperlipidemia in her father, maternal grandfather, maternal grandmother, mother, paternal grandfather, and paternal grandmother; Hypertension in her brother, maternal grandfather, maternal grandmother, mother, paternal grandfather, and paternal grandmother; Stroke in her brother.  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   Married       Right handed    Drinks caffeine one cup of coffee daily   An apartment    Social Determinants of Corporate investment banker Strain: Not on file  Food Insecurity: Not on file  Transportation Needs: Not on file  Physical Activity: Not on file  Stress: Not on file  Social Connections: Not on file  Intimate Partner Violence: Not on file    Lab Results  Component Value Date   HGBA1C 7.7 (H) 02/01/2023   Lab Results  Component Value Date   CHOL 146 05/11/2022   Lab Results  Component Value Date   HDL 35.20 (L) 05/11/2022  Lab Results  Component Value Date   LDLCALC 66 08/07/2018   Lab Results  Component Value Date   TRIG 360.0 (H) 05/11/2022   Lab Results  Component Value Date   CHOLHDL 4 05/11/2022   Lab Results  Component Value Date   CREATININE 0.75 02/01/2023   Lab Results  Component Value Date   GFR 78.58 02/01/2023   Lab Results  Component Value Date   MICROALBUR <0.7 05/11/2022      Component Value Date/Time   NA 140 02/01/2023 1423   NA 141 01/15/2020 1417   K 4.1 02/01/2023 1423   CL 102 02/01/2023 1423   CO2 28 02/01/2023 1423   GLUCOSE 131 (H) 02/01/2023 1423   BUN 14 02/01/2023 1423   BUN 8 01/15/2020 1417   CREATININE 0.75 02/01/2023 1423   CREATININE 0.59 (L) 11/26/2019 1319   CALCIUM 9.2 02/01/2023 1423   PROT 6.3 (L)  07/12/2022 1906   ALBUMIN 3.3 (L) 07/12/2022 1906   AST 23 07/12/2022 1906   ALT 15 07/12/2022 1906   ALKPHOS 52 07/12/2022 1906   BILITOT 0.6 07/12/2022 1906   GFRNONAA >60 07/12/2022 1906   GFRAA 110 01/15/2020 1417      Latest Ref Rng & Units 02/01/2023    2:23 PM 11/15/2022    1:53 PM 07/12/2022    7:06 PM  BMP  Glucose 70 - 99 mg/dL 161  096  045   BUN 6 - 23 mg/dL 14  14  9    Creatinine 0.40 - 1.20 mg/dL 4.09  8.11  9.14   Sodium 135 - 145 mEq/L 140  136  139   Potassium 3.5 - 5.1 mEq/L 4.1  4.3  3.8   Chloride 96 - 112 mEq/L 102  102  106   CO2 19 - 32 mEq/L 28  30  26    Calcium 8.4 - 10.5 mg/dL 9.2  9.2  8.6        Component Value Date/Time   WBC 8.8 07/12/2022 1803   RBC 4.92 07/12/2022 1803   HGB 14.2 07/12/2022 1803   HCT 44.6 07/12/2022 1803   PLT 264 07/12/2022 1803   MCV 90.7 07/12/2022 1803   MCH 28.9 07/12/2022 1803   MCHC 31.8 07/12/2022 1803   RDW 14.4 07/12/2022 1803   LYMPHSABS 2.7 07/12/2022 1803   MONOABS 0.7 07/12/2022 1803   EOSABS 0.1 07/12/2022 1803   BASOSABS 0.0 07/12/2022 1803     Parts of this note may have been dictated using voice recognition software. There may be variances in spelling and vocabulary which are unintentional. Not all errors are proofread. Please notify the Thereasa Parkin if any discrepancies are noted or if the meaning of any statement is not clear.

## 2023-02-06 ENCOUNTER — Other Ambulatory Visit (HOSPITAL_COMMUNITY): Payer: Self-pay

## 2023-02-06 ENCOUNTER — Encounter: Payer: Self-pay | Admitting: Cardiovascular Disease

## 2023-02-06 ENCOUNTER — Telehealth: Payer: Self-pay

## 2023-02-06 NOTE — Telephone Encounter (Signed)
Ok to send rx to [facility]? If which dosing?

## 2023-02-06 NOTE — Progress Notes (Signed)
Message sent to PA team. Debra Wright

## 2023-02-06 NOTE — Telephone Encounter (Signed)
-----   Message from Altamese Linthicum, MD sent at 02/05/2023  2:47 PM EDT ----- Please ask prior auth team why ozempic was denied last year and if any trulicity/mounjaro be covered? If not, will she qualify for pt assistance>?

## 2023-02-06 NOTE — Telephone Encounter (Signed)
Can someone from the PA team assist with a PA on Ozempic? Thanks,

## 2023-02-06 NOTE — Telephone Encounter (Signed)
Patient Advocate Encounter   Received notification from pt msgs that prior authorization is required for Ozempic  Per test claim:  PA not required for a 28 day supply.  Copay is $11.00  Pa not submitted at this time

## 2023-02-07 ENCOUNTER — Other Ambulatory Visit: Payer: Self-pay | Admitting: "Endocrinology

## 2023-02-07 MED ORDER — SEMAGLUTIDE(0.25 OR 0.5MG/DOS) 2 MG/3ML ~~LOC~~ SOPN
0.2500 mg | PEN_INJECTOR | SUBCUTANEOUS | 0 refills | Status: DC
Start: 2023-02-07 — End: 2023-03-16

## 2023-02-07 NOTE — Telephone Encounter (Signed)
Pt has been notified and voices understanding. Please assist with getting pt scheduled.

## 2023-02-10 ENCOUNTER — Other Ambulatory Visit: Payer: Self-pay | Admitting: Cardiology

## 2023-02-10 DIAGNOSIS — I480 Paroxysmal atrial fibrillation: Secondary | ICD-10-CM

## 2023-02-12 DIAGNOSIS — G5601 Carpal tunnel syndrome, right upper limb: Secondary | ICD-10-CM | POA: Diagnosis not present

## 2023-02-13 ENCOUNTER — Ambulatory Visit (INDEPENDENT_AMBULATORY_CARE_PROVIDER_SITE_OTHER): Payer: Medicare Other | Admitting: Cardiology

## 2023-02-13 DIAGNOSIS — I480 Paroxysmal atrial fibrillation: Secondary | ICD-10-CM

## 2023-02-13 DIAGNOSIS — Z7901 Long term (current) use of anticoagulants: Secondary | ICD-10-CM

## 2023-02-13 LAB — POCT INR: INR: 2.3 (ref 2.0–3.0)

## 2023-02-17 ENCOUNTER — Other Ambulatory Visit: Payer: Self-pay | Admitting: Cardiovascular Disease

## 2023-02-27 ENCOUNTER — Telehealth: Payer: Self-pay

## 2023-02-27 NOTE — Telephone Encounter (Signed)
Reminded pt to check INR 

## 2023-03-01 ENCOUNTER — Ambulatory Visit (INDEPENDENT_AMBULATORY_CARE_PROVIDER_SITE_OTHER): Payer: Medicare Other | Admitting: Cardiology

## 2023-03-01 ENCOUNTER — Telehealth: Payer: Self-pay

## 2023-03-01 DIAGNOSIS — Z7901 Long term (current) use of anticoagulants: Secondary | ICD-10-CM | POA: Diagnosis not present

## 2023-03-01 DIAGNOSIS — I480 Paroxysmal atrial fibrillation: Secondary | ICD-10-CM | POA: Diagnosis not present

## 2023-03-01 LAB — POCT INR: INR: 7.4 — AB (ref 2.0–3.0)

## 2023-03-01 NOTE — Telephone Encounter (Signed)
Lpmtcb and discuss INR result. °

## 2023-03-01 NOTE — Patient Instructions (Signed)
Description   Spoke with patient and advised to HOLD Warfarin today, tomorrow, and Saturday. Then, resume taking Warfarin 1 tablet (5mg ) daily except 1/2 tablet (2.5mg ) on Sundays, Wednesdays and Fridays.  Stay consistent with greens (3 per week).   Recheck INR on Monday (03/05/23)  ADVISED PT TO SEEK IMMEDIATE  MEDICAL ATTENTION IF SIGNS OR SYMPTOMS OF BLEEDING OCCURS.  Anticoagulation Clinic [phone]

## 2023-03-02 DIAGNOSIS — I480 Paroxysmal atrial fibrillation: Secondary | ICD-10-CM | POA: Diagnosis not present

## 2023-03-05 ENCOUNTER — Ambulatory Visit (INDEPENDENT_AMBULATORY_CARE_PROVIDER_SITE_OTHER): Payer: Medicare Other | Admitting: Cardiology

## 2023-03-05 DIAGNOSIS — I480 Paroxysmal atrial fibrillation: Secondary | ICD-10-CM | POA: Diagnosis not present

## 2023-03-05 DIAGNOSIS — Z5181 Encounter for therapeutic drug level monitoring: Secondary | ICD-10-CM | POA: Diagnosis not present

## 2023-03-05 DIAGNOSIS — Z7901 Long term (current) use of anticoagulants: Secondary | ICD-10-CM

## 2023-03-05 LAB — POCT INR: INR: 1.5 — AB (ref 2.0–3.0)

## 2023-03-05 NOTE — Patient Instructions (Signed)
Description   Spoke with patient and advised to HOLD Warfarin today, tomorrow, and Saturday. Then, resume taking Warfarin 1 tablet (5mg) daily except 1/2 tablet (2.5mg) on Sundays, Wednesdays and Fridays.  Stay consistent with greens (3 per week).   Recheck INR on Monday (03/05/23)  ADVISED PT TO SEEK IMMEDIATE  MEDICAL ATTENTION IF SIGNS OR SYMPTOMS OF BLEEDING OCCURS.  Anticoagulation Clinic [phone]      

## 2023-03-08 ENCOUNTER — Other Ambulatory Visit: Payer: Medicare Other

## 2023-03-12 ENCOUNTER — Telehealth: Payer: Self-pay | Admitting: *Deleted

## 2023-03-12 ENCOUNTER — Ambulatory Visit: Payer: Medicare Other | Admitting: Endocrinology

## 2023-03-12 ENCOUNTER — Telehealth: Payer: Self-pay

## 2023-03-12 ENCOUNTER — Ambulatory Visit (INDEPENDENT_AMBULATORY_CARE_PROVIDER_SITE_OTHER): Payer: Medicare Other | Admitting: Internal Medicine

## 2023-03-12 DIAGNOSIS — Z7901 Long term (current) use of anticoagulants: Secondary | ICD-10-CM | POA: Diagnosis not present

## 2023-03-12 DIAGNOSIS — I480 Paroxysmal atrial fibrillation: Secondary | ICD-10-CM | POA: Diagnosis not present

## 2023-03-12 LAB — POCT INR: INR: 3.2 — AB (ref 2.0–3.0)

## 2023-03-12 NOTE — Telephone Encounter (Signed)
Called pt since INR is due; she states she will do it soon. Will await & follow up.

## 2023-03-12 NOTE — Telephone Encounter (Signed)
   Pre-operative Risk Assessment    Patient Name: Debra Wright  DOB: 30-Jul-1951 MRN: [id number]     Request for Surgical Clearance    Procedure:  L5-S1 Es1  Date of Surgery:  Clearance TBD                                 Surgeon:  Dr. Aileen Fass  Surgeon's Group or Practice Name:  [facility]  Phone number:  [phone] Fax number:  [phone]   Type of Clearance Requested:   - Pharmacy:  Hold Warfarin (Coumadin) 5 days prior and 1 day after   Type of Anesthesia:  Not Indicated   Additional requests/questions:    Scarlette Shorts   03/12/2023, 9:45 AM

## 2023-03-12 NOTE — Telephone Encounter (Signed)
   Name: Debra Wright  DOB: 05-27-1951  MRN: [id number]  Primary Cardiologist: Nicki Guadalajara, MD  Chart reviewed as part of pre-operative protocol coverage. Because of [name]'s past medical history and time since last visit, she will require a follow-up telephone visit in order to better assess preoperative cardiovascular risk.  Pre-op covering staff: - Please schedule appointment and call patient to inform them. If patient already had an upcoming appointment within acceptable timeframe, please add "pre-op clearance" to the appointment notes so provider is aware. - Please contact requesting surgeon's office via preferred method (i.e, phone, fax) to inform them of need for appointment prior to surgery.  Per office protocol, patient can hold warfarin for 5 days prior to procedure and 1 day after. Patient will not need bridging with Lovenox (enoxaparin) around procedure.   Sharlene Dory, PA-C  03/12/2023, 4:55 PM

## 2023-03-12 NOTE — Telephone Encounter (Signed)
Patient with diagnosis of afib on warfarin for anticoagulation.    Procedure: L5-S1 ESI Date of procedure: TBD  CHA2DS2-VASc Score = 5  This indicates a 7.2% annual risk of stroke. The patient's score is based upon: CHF History: 1 HTN History: 1 Diabetes History: 1 Stroke History: 0 Vascular Disease History: 0 Age Score: 1 Gender Score: 1   CrCl 25mL/min using adjusted body weight Platelet count 264K   Per office protocol, patient can hold warfarin for 5 days prior to procedure and 1 day after. Patient will not need bridging with Lovenox (enoxaparin) around procedure.  **This guidance is not considered finalized until pre-operative APP has relayed final recommendations.**

## 2023-03-12 NOTE — Telephone Encounter (Signed)
Pt has in office appt with Dr. Tresa Endo 03/23/23. I have added need pre op clearance to appt notes. I will update all parties involved.

## 2023-03-13 DIAGNOSIS — I480 Paroxysmal atrial fibrillation: Secondary | ICD-10-CM | POA: Diagnosis not present

## 2023-03-13 DIAGNOSIS — I700 Atherosclerosis of aorta: Secondary | ICD-10-CM | POA: Diagnosis not present

## 2023-03-13 DIAGNOSIS — E538 Deficiency of other specified B group vitamins: Secondary | ICD-10-CM | POA: Diagnosis not present

## 2023-03-13 DIAGNOSIS — E7800 Pure hypercholesterolemia, unspecified: Secondary | ICD-10-CM | POA: Diagnosis not present

## 2023-03-13 DIAGNOSIS — Z1331 Encounter for screening for depression: Secondary | ICD-10-CM | POA: Diagnosis not present

## 2023-03-13 DIAGNOSIS — Z0000 Encounter for general adult medical examination without abnormal findings: Secondary | ICD-10-CM | POA: Diagnosis not present

## 2023-03-13 DIAGNOSIS — E1169 Type 2 diabetes mellitus with other specified complication: Secondary | ICD-10-CM | POA: Diagnosis not present

## 2023-03-13 DIAGNOSIS — N3281 Overactive bladder: Secondary | ICD-10-CM | POA: Diagnosis not present

## 2023-03-13 DIAGNOSIS — F411 Generalized anxiety disorder: Secondary | ICD-10-CM | POA: Diagnosis not present

## 2023-03-13 DIAGNOSIS — I10 Essential (primary) hypertension: Secondary | ICD-10-CM | POA: Diagnosis not present

## 2023-03-13 DIAGNOSIS — K219 Gastro-esophageal reflux disease without esophagitis: Secondary | ICD-10-CM | POA: Diagnosis not present

## 2023-03-16 ENCOUNTER — Other Ambulatory Visit: Payer: Self-pay | Admitting: "Endocrinology

## 2023-03-22 DIAGNOSIS — C44622 Squamous cell carcinoma of skin of right upper limb, including shoulder: Secondary | ICD-10-CM | POA: Diagnosis not present

## 2023-03-22 DIAGNOSIS — L218 Other seborrheic dermatitis: Secondary | ICD-10-CM | POA: Diagnosis not present

## 2023-03-23 ENCOUNTER — Ambulatory Visit: Payer: Medicare Other | Attending: Cardiovascular Disease | Admitting: Cardiovascular Disease

## 2023-03-23 VITALS — BP 112/62 | HR 68 | Ht 61.0 in | Wt 236.4 lb

## 2023-03-23 DIAGNOSIS — I480 Paroxysmal atrial fibrillation: Secondary | ICD-10-CM | POA: Diagnosis not present

## 2023-03-23 DIAGNOSIS — Z7901 Long term (current) use of anticoagulants: Secondary | ICD-10-CM

## 2023-03-23 DIAGNOSIS — I4811 Longstanding persistent atrial fibrillation: Secondary | ICD-10-CM | POA: Diagnosis not present

## 2023-03-23 DIAGNOSIS — E785 Hyperlipidemia, unspecified: Secondary | ICD-10-CM | POA: Diagnosis not present

## 2023-03-23 DIAGNOSIS — I350 Nonrheumatic aortic (valve) stenosis: Secondary | ICD-10-CM | POA: Insufficient documentation

## 2023-03-23 DIAGNOSIS — G4733 Obstructive sleep apnea (adult) (pediatric): Secondary | ICD-10-CM | POA: Diagnosis not present

## 2023-03-23 DIAGNOSIS — E782 Mixed hyperlipidemia: Secondary | ICD-10-CM | POA: Insufficient documentation

## 2023-03-23 DIAGNOSIS — Z6841 Body Mass Index (BMI) 40.0 and over, adult: Secondary | ICD-10-CM | POA: Insufficient documentation

## 2023-03-23 DIAGNOSIS — D6859 Other primary thrombophilia: Secondary | ICD-10-CM | POA: Diagnosis not present

## 2023-03-23 NOTE — Progress Notes (Unsigned)
Cardiology Office Note    Date:  03/24/2023   ID:  Debra Wright, Debra Wright [birth year], MRN [id number]  PCP:  Wilfrid Lund, PA  Cardiologist:  Nicki Guadalajara, MD   4 month f/u cardiology office visit   History of Present Illness:  Debra Wright is a 72 y.o. female who was seen by me in the hospital in August 2019 when she presented with new onset atrial fibrillation and reverted to sinus rhythm in the emergency room after multiple rounds of IV metoprolol.  She has a history of hypertension.  She had mild chest tightness associated with a rapid rate, troponins were negative and she had nonspecific ST-T changes.    The patient was evaluated by Corine Shelter, PA-C on January 27, 2019 in a telemedicine conference.  She has a history of hypertension, type 2 diabetes mellitus, hyperlipidemia, anxiety, and has sleep apnea which is followed by Dr. Rennis Petty.  During her 2019 hospitalization, she converted to sinus rhythm with IV metoprolol and oral metoprolol was added to her regimen.  Amlodipine was changed to diltiazem.  An echo Doppler in the hospital demonstrated normal LV function with moderate LVH, grade 2 diastolic dysfunction, aortic valve sclerosis without stenosis, and mild to moderate left atrial enlargement.  Subsequently, her metoprolol dose had been reduced due to fatigability.  She had undergone total knee replacement in March 2020 completed physical therapy.  I saw her on September 09, 2019 and denied any awareness of recurrent AF.  Since her knee surgery she had a 20 pound weight loss but subsequently admits that she has regained approximately 12 pounds back.  She has had recent blood pressure lability.  She continues to use CPAP.    She was last evaluated in a telemedicine evaluation on March 05, 2020 and remained stable and was unaware of any recurrence of her atrial fibrillation.  He continues to be on long-term anticoagulation therapy with warfarin.  She was continuing to use CPAP followed by Dr.  Vassie Loll.  I have not seen her I evaluated her since her telemedicine visit in 2021.   She underwent a 2D echo Doppler study on December 22, 2021 which showed hyperdynamic LV function with EF 65 to 70% with moderate concentric LVH and grade 2 diastolic dysfunction.  Pulmonary pressures were normal.  Left atrial size is moderately dilated.  There was a small circumferential pericardial effusion.  There was moderate mitral annular calcification.  She had moderate calcification of his aortic valve and was felt to have mild aortic valve stenosis with a mean gradient of 8 and peak gradient of 14.4 mmHg.   I last saw her on November 13, 2022.  She has continued to be followed by Dr. Lucianne Muss for her diabetes mellitus and sees Dr. Vassie Loll for her CPAP therapy/OSA.  She checks her warfarin levels at home and then calls our Coumadin clinic.  Her last laboratory on January 30 was increased at 3.1 and she was advised to reduce her warfarin and take 5 mg daily with the exception of 2.5 mg on Wednesday and Sundays.  Sees Horton Marshall, PA at [facility] for primary care.  Lipid studies on September 08, 2022 showed total cholesterol 137 HDL 44 LDL 60 and triglycerides were elevated at 202.  She admits to easy bruisability.  She continues to be on diltiazem CD180 mg spironolactone 12.5 mg and metoprolol titrate 25 mg twice a day for blood pressure and heart rate control.  She is diabetic  on Jardiance, metformin.  She is on rosuvastatin 20 mg and omega-3 fatty acid 1200 mg twice a day for mixed hyperlipidemia.  She is unaware of any recurrent A-fib.  She denies chest pain or shortness of breath.  She cares for her disabled husband.  During that evaluation, I recommended that she undergo a follow-up echo Doppler study for reassessment of her aortic stenosis.  Debra Wright underwent a 1 year follow-up echo Doppler study on December 08, 2022.  This revealed normal LV function with EF 60 to 65% without wall motion abnormalities.  She had moderate LVH.  There  was slightly reduced RV systolic function with mildly enlarged RV ventricular size.  There was moderate mitral annular calcification with trivial MR.  There was moderate aortic valve calcification with mild aortic stenosis with a mean gradient of 7.4 and peak gradient of 12.8 mmHg.  Ascending aorta was mildly dilated at 41 mm.  Presently, Mr. Cranfield feels well.  She has purposely lost 10 pounds since March predominantly with improvement in her diet.  She has had both knees replaced and does not exercise.  She is on diltiazem 180 mg, metoprolol tartrate 25 mg twice a day, spironolactone 12.5 mg daily for hypertension.  She is on Jardiance 25 mg glimepiride 4 mg and Ozempic as well as metformin for her diabetes mellitus.  She continues to be on warfarin.  She presents for evaluation.   Past Medical History:  Diagnosis Date   Anxiety    Arthritis    "knees; right thumb" (10/14/2014)   Basal cell carcinoma    "burned off upper lip & right shoulder"   Chronic anticoagulation    CHADS VASC=3   Complication of anesthesia    after spinal anesthesia for left knee replacement, bladder did "not wake up". Was incontinent for 4 days post surgery   Depression    Fibroid tumor    Frequent diarrhea    GERD (gastroesophageal reflux disease)    High cholesterol    Hypertension    EF 65-70% grade 2DD   Insomnia    Migraine    "frequently when I was younger; maybe monthly now" (10/14/2014) - none after hysterectomy   PAF (paroxysmal atrial fibrillation) (HCC) 05/02/2018   converted with rate control   Pernicious anemia    pt not aware of this   Sleep apnea    uses a cpap   Type II diabetes mellitus (HCC)    Vitamin B 12 deficiency     Past Surgical History:  Procedure Laterality Date   ABDOMINAL HYSTERECTOMY  1988   ACHILLES TENDON SURGERY Right    APPENDECTOMY  1988   BILATERAL OOPHORECTOMY  2004   CARPAL TUNNEL RELEASE Bilateral 1986   CATARACT EXTRACTION W/ INTRAOCULAR LENS  IMPLANT, BILATERAL  Bilateral 2011   COLONOSCOPY     JOINT REPLACEMENT     SHOULDER ARTHROSCOPY DISTAL CLAVICLE EXCISION AND OPEN ROTATOR CUFF REPAIR Right 11/2013   TOENAIL EXCISION Right 04/2018   "big toe"   TONSILLECTOMY  1950's   TOTAL KNEE ARTHROPLASTY Left 10/13/2014   Procedure: LEFT TOTAL KNEE ARTHROPLASTY;  Surgeon: Kathryne Hitch, MD;  Location: [facility] OR;  Service: Orthopedics;  Laterality: Left;   TOTAL KNEE ARTHROPLASTY Right 12/10/2018   Procedure: RIGHT TOTAL KNEE ARTHROPLASTY;  Surgeon: Kathryne Hitch, MD;  Location: [facility] OR;  Service: Orthopedics;  Laterality: Right;    Current Medications: Outpatient Medications Prior to Visit  Medication Sig Dispense Refill   Blood Glucose Monitoring Suppl (  ACCU-CHEK GUIDE ME) w/Device KIT USE AS DIRECTED.  Dx E11.9. 1 kit 11   Calcium Carbonate-Vitamin D (CALCIUM 600 + D PO) Take 1 tablet by mouth daily.     cetirizine (ZYRTEC) 10 MG tablet Take 10 mg by mouth daily as needed for allergies.     CINNAMON PO Take 2 tablets by mouth daily.     clonazePAM (KLONOPIN) 0.5 MG tablet TAKE 1/2 TO 1 (ONE-HALF TO ONE) TABLET BY MOUTH AT BEDTIME AS NEEDED (Patient taking differently: Take 0.25-0.5 mg by mouth daily as needed for anxiety.) 30 tablet 0   Cyanocobalamin (VITAMIN B12) 1000 MCG TBCR Take 1,000 mcg by mouth daily.     diltiazem (CARDIZEM CD) 180 MG 24 hr capsule TAKE ONE CAPSULE BY MOUTH ONE TIME DAILY 90 capsule 0   empagliflozin (JARDIANCE) 25 MG TABS tablet Take 1 tablet (25 mg total) by mouth daily before breakfast. 90 tablet 1   escitalopram (LEXAPRO) 10 MG tablet 1/2 tablet once a day x 1 week, then increase to 1 tablet daily     Eszopiclone 3 MG TABS Take 1 tablet (3 mg total) by mouth at bedtime as needed. Take immediately before bedtime 30 tablet 3   Ferrous Sulfate (IRON) 325 (65 Fe) MG TABS Take 325 mg by mouth 2 (two) times daily.     fluconazole (DIFLUCAN) 150 MG tablet Take 1 tablet (150 mg total) by mouth every three (3) days as  needed. 5 tablet 0   gabapentin (NEURONTIN) 100 MG capsule Take 1 capsule (100 mg total) by mouth as needed (neuropathic pain). 90 capsule 0   glucose blood (ACCU-CHEK GUIDE) test strip CHECK BLOOD GLUCOSE IN THE MORNING AND AT BEDTIME.  E11.9 code. 100 strip 12   magnesium oxide (MAG-OX) 400 (241.3 Mg) MG tablet Take 400 mg by mouth daily.     metFORMIN (GLUCOPHAGE) 1000 MG tablet Take 1 tablet (1,000 mg total) by mouth 2 (two) times daily with a meal. 180 tablet 0   metoprolol tartrate (LOPRESSOR) 25 MG tablet Take 1 tablet (25 mg total) by mouth 2 (two) times daily. 180 tablet 0   MYRBETRIQ 25 MG TB24 tablet Take 25 mg by mouth daily.     Omega-3 Fatty Acids (FISH OIL) 1200 MG CAPS Take 1,200 mg by mouth 2 (two) times daily.     omeprazole (PRILOSEC) 40 MG capsule Take 1 capsule by mouth once daily 90 capsule 0   OZEMPIC, 0.25 OR 0.5 MG/DOSE, 2 MG/3ML SOPN INJECT 0.25MG  INTO THE SKIN ONCE A WEEK 3 mL 0   spironolactone (ALDACTONE) 25 MG tablet TAKE 1/2 TABLET BY MOUTH EVERY DAY. 45 tablet 1   vitamin C (ASCORBIC ACID) 250 MG tablet Take 250 mg by mouth daily.     warfarin (COUMADIN) 5 MG tablet TAKE HALF TO ONE TABLET BY MOUTH DAILY AS DIRECTED BY COUMADIN CLINIC 75 tablet 0   calcium carbonate (TUMS - DOSED IN MG ELEMENTAL CALCIUM) 500 MG chewable tablet Chew 2 tablets by mouth daily as needed for indigestion or heartburn. (Patient not taking: Reported on 03/23/2023)     cyanocobalamin (,VITAMIN B-12,) 1000 MCG/ML injection Inject 1,000 mcg into the muscle once a week. (Patient not taking: Reported on 03/23/2023)     eszopiclone 3 MG TABS Take 3 mg by mouth at bedtime. (Patient not taking: Reported on 03/23/2023)     glimepiride (AMARYL) 4 MG tablet Take 1 tablet (4 mg total) by mouth daily before breakfast. (Patient not taking: Reported on 03/23/2023)  30 tablet 3   rosuvastatin (CRESTOR) 20 MG tablet Take 1 tablet (20 mg total) by mouth daily. 90 tablet 1   tolterodine (DETROL) 2 MG tablet Take 2  mg by mouth 2 (two) times daily. (Patient not taking: Reported on 03/23/2023)     No facility-administered medications prior to visit.     Allergies:   Antivert [meclizine hcl], Meclizine, Trazodone hcl, Zolpidem, and Zolpidem tartrate   Social History   Socioeconomic History   Marital status: Married    Spouse name: Not on file   Number of children: 1   Years of education: 14   Highest education level: Not on file  Occupational History   Not on file  Tobacco Use   Smoking status: Never   Smokeless tobacco: Never  Vaping Use   Vaping Use: Never used  Substance and Sexual Activity   Alcohol use: Yes    Comment: 10/14/2014 "might have a drink when I'm out once/month"   Drug use: No   Sexual activity: Not Currently  Other Topics Concern   Not on file  Social History Narrative   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   Married       Right handed    Drinks caffeine one cup of coffee daily   An apartment    Social Determinants of Corporate investment banker Strain: Not on Ship broker Insecurity: Not on file  Transportation Needs: Not on file  Physical Activity: Not on file  Stress: Not on file  Social Connections: Not on file     Family History:  The patient's family history includes Arthritis in her father, maternal grandfather, maternal grandmother, mother, paternal grandfather, and paternal grandmother; Diabetes in her brother, father, maternal grandmother, mother, paternal grandfather, and paternal grandmother; Heart disease in her father; Hyperlipidemia in her father, maternal grandfather, maternal grandmother, mother, paternal grandfather, and paternal grandmother; Hypertension in her brother, maternal grandfather, maternal grandmother, mother, paternal grandfather, and paternal grandmother; Stroke in her brother.   ROS General: Negative; No fevers, chills, or night sweats;  HEENT: Negative; No changes in vision or hearing, sinus congestion, difficulty  swallowing Pulmonary: Negative; No cough, wheezing, shortness of breath, hemoptysis Cardiovascular: HPI GI: Negative; No nausea, vomiting, diarrhea, or abdominal pain GU: Negative; No dysuria, hematuria, or difficulty voiding Musculoskeletal: Right total knee replacement Hematologic/Oncology: Negative; no easy bruising, bleeding Endocrine: diabetes mellitus Neuro: Negative; no changes in balance, headaches Skin: Negative; No rashes or skin lesions Psychiatric: ,depression Sleep: OSA on CPAP followed by Dr. Vassie Loll  Other comprehensive 14 point system review is negative.   PHYSICAL EXAM:   VS:  BP 112/62   Pulse 68   Ht 5\' 1"  (1.549 m)   Wt 236 lb 6.4 oz (107.2 kg)   SpO2 97%   BMI 44.67 kg/m     Repeat blood pressure by me 1128/66  Wt Readings from Last 3 Encounters:  03/23/23 236 lb 6.4 oz (107.2 kg)  02/05/23 242 lb 9.6 oz (110 kg)  11/13/22 230 lb (104.3 kg)     General: Alert, oriented, no distress.  Skin: normal turgor, no rashes, warm and dry HEENT: Normocephalic, atraumatic. Pupils equal round and reactive to light; sclera anicteric; extraocular muscles intact;  Nose without nasal septal hypertrophy Mouth/Parynx benign; Mallinpatti scale 3/4 Neck: No JVD, no carotid bruits; normal carotid upstroke Lungs: clear to ausculatation and percussion; no wheezing or rales Chest wall: without tenderness to palpitation Heart: PMI not displaced, irregularly  irregular consistent with atrial fibrillation, s1 s2 normal, 2/6 systolic murmur in aortic area, no diastolic murmur, no rubs, gallops, thrills, or heaves Abdomen: soft, nontender; no hepatosplenomehaly, BS+; abdominal aorta nontender and not dilated by palpation. Back: no CVA tenderness Pulses 2+ Musculoskeletal: full range of motion, normal strength, no joint deformities Extremities: no clubbing cyanosis or edema, Homan's sign negative  Neurologic: grossly nonfocal; Cranial nerves grossly wnl Psychologic: Normal mood and  affect    Studies/Labs Reviewed:   EKG Interpretation  Date/Time:  Friday March 23 2023 14:59:25 EDT Ventricular Rate:  64 PR Interval:    QRS Duration: 92 QT Interval:  388 QTC Calculation: 400 R Axis:   -37 Text Interpretation: Atrial fibrillation Left axis deviation Moderate voltage criteria for LVH, may be normal variant ( R in aVL , Cornell product ) Septal infarct , age undetermined No significant change since 11/13/2022 tracing Confirmed by Nicki Guadalajara ([id number]) on 03/23/2023 3:43:36 PM     November 13, 2022 ECG (independently read by me): Atrial fibrillation at 54 bpm.  LVH by voltage.  Normal intervals.  No ectopy  Recent Labs:    Latest Ref Rng & Units 02/01/2023    2:23 PM 11/15/2022    1:53 PM 07/12/2022    7:06 PM  BMP  Glucose 70 - 99 mg/dL 601  093  235   BUN 6 - 23 mg/dL 14  14  9    Creatinine 0.40 - 1.20 mg/dL 5.73  2.20  2.54   Sodium 135 - 145 mEq/L 140  136  139   Potassium 3.5 - 5.1 mEq/L 4.1  4.3  3.8   Chloride 96 - 112 mEq/L 102  102  106   CO2 19 - 32 mEq/L 28  30  26    Calcium 8.4 - 10.5 mg/dL 9.2  9.2  8.6         Latest Ref Rng & Units 07/12/2022    7:06 PM 05/11/2022   12:43 PM 11/26/2019    1:19 PM  Hepatic Function  Total Protein 6.5 - 8.1 g/dL 6.3  6.6  5.9   Albumin 3.5 - 5.0 g/dL 3.3  4.0    AST 15 - 41 U/L 23  21  12    ALT 0 - 44 U/L 15  25  17    Alk Phosphatase 38 - 126 U/L 52  65    Total Bilirubin 0.3 - 1.2 mg/dL 0.6  0.3  0.4        Latest Ref Rng & Units 07/12/2022    6:03 PM 09/29/2021    2:17 PM 09/29/2021   12:54 PM  CBC  WBC 4.0 - 10.5 K/uL 8.8   9.8   Hemoglobin 12.0 - 15.0 g/dL 27.0  62.3  76.2   Hematocrit 36.0 - 46.0 % 44.6  37.0  41.4   Platelets 150 - 400 K/uL 264   313    Lab Results  Component Value Date   MCV 90.7 07/12/2022   MCV 91.4 09/29/2021   MCV 89.6 11/26/2019   Lab Results  Component Value Date   TSH 2.14 03/04/2019   Lab Results  Component Value Date   HGBA1C 7.7 (H) 02/01/2023      BNP    Component Value Date/Time   BNP 110.6 (H) 05/03/2018 0153    ProBNP No results found for: "PROBNP"   Lipid Panel     Component Value Date/Time   CHOL 146 05/11/2022 1243   TRIG 360.0 (H) 05/11/2022 1243  HDL 35.20 (L) 05/11/2022 1243   CHOLHDL 4 05/11/2022 1243   VLDL 72.0 (H) 05/11/2022 1243   LDLCALC 66 08/07/2018 1530   LDLDIRECT 82.0 05/11/2022 1243     RADIOLOGY: No results found.   Additional studies/ records that were reviewed today include:  Reviewed the patient's hospitalization from August 2019.  ------------------------------------------------------------------- ECHO 05/03/2018 Study Conclusions   - Left ventricle: The cavity size was normal. There was moderate   concentric hypertrophy. Systolic function was vigorous. The   estimated ejection fraction was in the range of 65% to 70%. Wall   motion was normal; there were no regional wall motion   abnormalities. Features are consistent with a pseudonormal left   ventricular filling pattern, with concomitant abnormal relaxation   and increased filling pressure (grade 2 diastolic dysfunction).   Doppler parameters are consistent with high ventricular filling   pressure. - Aortic valve: Trileaflet; mildly thickened, mildly calcified   leaflets. - Aorta: Aortic root dimension: 37 mm (ED). - Mitral valve: Calcified annulus. - Left atrium: The atrium was mildly to moderately dilated. - Pulmonary arteries: Systolic pressure could not be accurately   estimated.    ECHO: 12/22/2021 1. Left ventricular ejection fraction, by estimation, is 65 to 70%. The  left ventricle has normal function. The left ventricle has no regional  wall motion abnormalities. There is moderate concentric left ventricular  hypertrophy. Left ventricular  diastolic parameters are consistent with Grade II diastolic dysfunction  (pseudonormalization).   2. Right ventricular systolic function is normal. The right ventricular   size is mildly enlarged. There is normal pulmonary artery systolic  pressure. The estimated right ventricular systolic pressure is 16.6 mmHg.   3. Left atrial size was moderately dilated.   4. A small pericardial effusion is present. The pericardial effusion is  circumferential. There is no evidence of cardiac tamponade.   5. The mitral valve is grossly normal. Trivial mitral valve  regurgitation. No evidence of mitral stenosis. Moderate mitral annular  calcification.   6. The aortic valve was not well visualized. There is moderate  calcification of the aortic valve. There is moderate thickening of the  aortic valve. Aortic valve regurgitation is not visualized. Mild aortic  valve stenosis.   7. Aortic dilatation noted. Aneurysm of the ascending aorta, measuring 40  mm.   8. The inferior vena cava is dilated in size with >50% respiratory  variability, suggesting right atrial pressure of 8 mmHg.   Comparison(s): Changes from prior study are noted. EF 65%, moderate LVH,  AOR 37mm.   Conclusion(s)/Recommendation(s): Ascending aorta measures 40 mm on current  study, otherwise no significant changes.     ASSESSMENT:    1. Longstanding persistent atrial fibrillation (HCC)   2. Chronic anticoagulation   3. Mild aortic stenosis   4. Hyperlipidemia LDL goal <70   5. Mixed hyperlipidemia   6. Morbid obesity with BMI of 40.0-44.9, adult (HCC)   7. Hypercoagulable state (HCC)   8. OSA (obstructive sleep apnea)     PLAN:  Debra Wright is a 72 year-old female who has a history of diabetes mellitus since age 43 as well as a history of hypertension, hyperlipidemia, obstructive sleep apnea and anxiety. She developed an new onset episode of atrial fibrillation with RVR leading to hospitalization in August 2019 at which time she converted successfully with IV metoprolol to sinus rhythm.  Only, she has been without recurrent awareness of arrhythmia.  Evaluation with me in December 2020  and her  telemedicine visit in June 2021 she was maintaining sinus rhythm.  Has had issues with blood pressure and she currently is on a regimen of diltiazem CD1 80 mg, metoprolol tartrate 25 mg twice a day and spironolactone 12.5 mg daily.  When I saw her in February 2024 her blood pressure was stable.  She was now in atrial fibrillation with controlled ventricular rate in the 60s.  She has been maintained on chronic warfarin therapy.  Since her February 2024 evaluation, she underwent a repeat echo Doppler study on December 08, 2022 which I reviewed with her today in detail and showed  normal LV contractility.  EF 6065%.  She has moderate LVH with slightly reduced RV systolic function and mild RV enlargement.  She does have moderate mitral annular calcification with trivial MR as well as moderate aortic valve calcification and is now felt to have mild aortic stenosis with a mean gradient of 7.4 and peak gradient of 12.8 mmHg.  Ascending aorta was mildly dilated at 41 mm.  She has been maintaining anticoagulation with warfarin.  Her blood pressure today is stable and on repeat by me was 128/66 on diltiazem 180 mg daily in addition to spironolactone 12.5 mg and metoprolol tartrate 25 mg twice a day.  She is diabetic on Jardiance, glimepiride, metformin, in addition to Ozempic weekly injection.  She is on rosuvastatin 20 mg for hyperlipidemia and takes over-the-counter fish oil.  Presently she is doing well and I commended her on her weight loss.  She sees Horton Marshall, [place] for primary care.  I have recommended that next March 2025 she have a 1 year follow-up echo and I will see her in April for follow-up evaluation or sooner as needed.  Medication Adjustments/Labs and Tests Ordered: Current medicines are reviewed at length with the patient today.  Concerns regarding medicines are outlined above.  Medication changes, Labs and Tests ordered today are listed in the Patient Instructions below. Patient Instructions   Medication Instructions:  Continue same medications *If you need a refill on your cardiac medications before your next appointment, please call your pharmacy*   Lab Work: None ordered   Testing/Procedures: Schedule Echo in 12/2023   Follow-Up: At [facility], you and your health needs are our priority.  As part of our continuing mission to provide you with exceptional heart care, we have created designated Provider Care Teams.  These Care Teams include your primary Cardiologist (physician) and Advanced Practice Providers (APPs -  Physician Assistants and Nurse Practitioners) who all work together to provide you with the care you need, when you need it.  We recommend signing up for the patient portal called "MyChart".  Sign up information is provided on this After Visit Summary.  MyChart is used to connect with patients for Virtual Visits (Telemedicine).  Patients are able to view lab/test results, encounter notes, upcoming appointments, etc.  Non-urgent messages can be sent to your provider as well.   To learn more about what you can do with MyChart, go to [email or web address].    Your next appointment:  01/2024    Provider:  [facility]     Signed, Nicki Guadalajara, MD  03/24/2023 4:31 PM    [facility] [address], [place], [place]  [address] Phone: [phone]

## 2023-03-23 NOTE — Patient Instructions (Signed)
Medication Instructions:  Continue same medications *If you need a refill on your cardiac medications before your next appointment, please call your pharmacy*   Lab Work: None ordered   Testing/Procedures: Schedule Echo in 12/2023   Follow-Up: At [facility], you and your health needs are our priority.  As part of our continuing mission to provide you with exceptional heart care, we have created designated Provider Care Teams.  These Care Teams include your primary Cardiologist (physician) and Advanced Practice Providers (APPs -  Physician Assistants and Nurse Practitioners) who all work together to provide you with the care you need, when you need it.  We recommend signing up for the patient portal called "MyChart".  Sign up information is provided on this After Visit Summary.  MyChart is used to connect with patients for Virtual Visits (Telemedicine).  Patients are able to view lab/test results, encounter notes, upcoming appointments, etc.  Non-urgent messages can be sent to your provider as well.   To learn more about what you can do with MyChart, go to [email or web address].    Your next appointment:  01/2024    Provider:  [facility]

## 2023-03-24 ENCOUNTER — Encounter: Payer: Self-pay | Admitting: Cardiovascular Disease

## 2023-03-25 ENCOUNTER — Encounter: Payer: Self-pay | Admitting: Cardiovascular Disease

## 2023-03-26 ENCOUNTER — Ambulatory Visit (INDEPENDENT_AMBULATORY_CARE_PROVIDER_SITE_OTHER): Payer: Medicare Other | Admitting: *Deleted

## 2023-03-26 ENCOUNTER — Telehealth: Payer: Self-pay | Admitting: *Deleted

## 2023-03-26 DIAGNOSIS — I480 Paroxysmal atrial fibrillation: Secondary | ICD-10-CM

## 2023-03-26 DIAGNOSIS — Z7901 Long term (current) use of anticoagulants: Secondary | ICD-10-CM

## 2023-03-26 LAB — POCT INR: INR: 5.5 — AB (ref 2.0–3.0)

## 2023-03-26 NOTE — Telephone Encounter (Signed)
Spoke with pt since INR is due, she states she will take care of it soon. Will await and follow up.

## 2023-03-26 NOTE — Patient Instructions (Addendum)
Description   PENDING PROCEDURE CAN HOLD 5 DAYS NO BRIDGE. Spoke with patient and advised to hold today's dose of warfarin and hold tomorrow's dose of warfarin then START taking Warfarin 1/2 tablet (2.5mg ) daily except 1 tablet (5mg ) on Mondays, Tuesday, and Thursdays.  Stay consistent with greens (3 per week).  Eat greens tonight Recheck INR in 1 week (normally 2 weeks).  Anticoagulation Clinic [phone]

## 2023-03-28 NOTE — Telephone Encounter (Signed)
Spoke with pt this morning after checking onbase fax, No form found. Pt will have form faxed again

## 2023-03-28 NOTE — Telephone Encounter (Signed)
Dr. Kathlene Cote office sent duplicate request. Pt was recently seen by Dr. Tresa Endo on 03/23/23 with appt notes stating we need pre op clearance. I have reviewed the ov notes however I do not see that Dr. Tresa Endo mentioned if he cleared the pt. I will forward to pre op APP and to Dr. Tresa Endo to please advise if the pt has been cleared.

## 2023-03-30 NOTE — Telephone Encounter (Signed)
I will forward to pre op APP today to see notes from Dr. Tresa Endo and any further recommendations.

## 2023-03-30 NOTE — Telephone Encounter (Signed)
Patient is on longstanding anticoagulation for atrial fibrillation.  She is stable cardiovascularly.  However she is on warfarin with most recent INR elevated.  She is cleared cardiovascularly but her warfarin dose may need to be held for the procedure if this is an epidural injection.

## 2023-03-30 NOTE — Telephone Encounter (Signed)
   Patient Name: Debra Wright  DOB: 1951/04/20 MRN: [id number]  Primary Cardiologist: Nicki Guadalajara, MD  Chart reviewed as part of pre-operative protocol coverage. Given past medical history and time since last visit, based on ACC/AHA guidelines, [name] is at acceptable risk and stable for the planned procedure without further cardiovascular testing.   The patient was advised that if she develops new symptoms prior to surgery to contact our office to arrange for a follow-up visit, and she verbalized understanding.  Per office protocol, patient can hold warfarin for 5 days prior to procedure and 1 day after. Patient will not need bridging with Lovenox (enoxaparin) around procedure.   I will route this recommendation to the requesting party via Epic fax function and remove from pre-op pool.  Please call with questions.  Napoleon Form, Leodis Rains, NP 03/30/2023, 10:28 AM

## 2023-04-02 ENCOUNTER — Ambulatory Visit (INDEPENDENT_AMBULATORY_CARE_PROVIDER_SITE_OTHER): Payer: Medicare Other

## 2023-04-02 DIAGNOSIS — Z7901 Long term (current) use of anticoagulants: Secondary | ICD-10-CM | POA: Diagnosis not present

## 2023-04-02 DIAGNOSIS — I480 Paroxysmal atrial fibrillation: Secondary | ICD-10-CM | POA: Diagnosis not present

## 2023-04-02 LAB — POCT INR: INR: 3.2 — AB (ref 2.0–3.0)

## 2023-04-02 NOTE — Patient Instructions (Signed)
Description   PENDING PROCEDURE CAN HOLD 5 DAYS NO BRIDGE. Spoke with patient and advised to eat a serving of greens today and continue taking Warfarin 1/2 tablet (2.5mg ) daily except 1 tablet (5mg ) on Mondays, Tuesday, and Thursdays.  Stay consistent with greens (3 per week).  Recheck INR in 2 weeks.  Anticoagulation Clinic [phone]

## 2023-04-02 NOTE — Telephone Encounter (Signed)
Calling ask that the clearance be refax they didn't get it [phone]. Please advise

## 2023-04-02 NOTE — Telephone Encounter (Signed)
Clearance refax to number provider

## 2023-04-12 DIAGNOSIS — M5416 Radiculopathy, lumbar region: Secondary | ICD-10-CM | POA: Diagnosis not present

## 2023-04-13 ENCOUNTER — Other Ambulatory Visit: Payer: Self-pay | Admitting: "Endocrinology

## 2023-04-16 ENCOUNTER — Ambulatory Visit: Payer: Medicare Other | Admitting: Orthopaedic Surgery

## 2023-04-17 ENCOUNTER — Ambulatory Visit: Payer: Self-pay | Admitting: Cardiology

## 2023-04-17 DIAGNOSIS — I480 Paroxysmal atrial fibrillation: Secondary | ICD-10-CM | POA: Diagnosis not present

## 2023-04-17 DIAGNOSIS — Z7901 Long term (current) use of anticoagulants: Secondary | ICD-10-CM | POA: Diagnosis not present

## 2023-04-17 LAB — POCT INR: INR: 2.4 (ref 2.0–3.0)

## 2023-04-18 ENCOUNTER — Other Ambulatory Visit: Payer: Self-pay

## 2023-04-18 ENCOUNTER — Encounter: Payer: Self-pay | Admitting: "Endocrinology

## 2023-04-19 ENCOUNTER — Other Ambulatory Visit: Payer: Self-pay

## 2023-04-20 ENCOUNTER — Encounter: Payer: Self-pay | Admitting: "Endocrinology

## 2023-04-25 ENCOUNTER — Other Ambulatory Visit: Payer: Self-pay

## 2023-04-25 DIAGNOSIS — E1140 Type 2 diabetes mellitus with diabetic neuropathy, unspecified: Secondary | ICD-10-CM

## 2023-04-25 MED ORDER — OZEMPIC (0.25 OR 0.5 MG/DOSE) 2 MG/3ML ~~LOC~~ SOPN: 0.5000 mg | PEN_INJECTOR | SUBCUTANEOUS | 0 refills | Status: DC

## 2023-04-25 MED ORDER — OZEMPIC (0.25 OR 0.5 MG/DOSE) 2 MG/3ML ~~LOC~~ SOPN: 0.2500 mg | PEN_INJECTOR | SUBCUTANEOUS | 0 refills | Status: DC

## 2023-04-25 NOTE — Addendum Note (Signed)
Addended byAltamese Eden on: 04/25/2023 02:17 PM   Modules accepted: Orders

## 2023-04-30 ENCOUNTER — Ambulatory Visit: Payer: Medicare Other | Admitting: Physician Assistant

## 2023-05-01 ENCOUNTER — Other Ambulatory Visit: Payer: Self-pay | Admitting: Cardiology

## 2023-05-01 DIAGNOSIS — I480 Paroxysmal atrial fibrillation: Secondary | ICD-10-CM

## 2023-05-01 NOTE — Telephone Encounter (Signed)
Prescription refill request received for warfarin Lov: 03/23/23 Tresa Endo)  Next INR check: 05/03/23 Warfarin tablet strength: 5mg   Appropriate dose. Refill sent.

## 2023-05-03 ENCOUNTER — Telehealth: Payer: Self-pay

## 2023-05-03 ENCOUNTER — Ambulatory Visit (INDEPENDENT_AMBULATORY_CARE_PROVIDER_SITE_OTHER): Payer: Medicare Other | Admitting: *Deleted

## 2023-05-03 DIAGNOSIS — Z7901 Long term (current) use of anticoagulants: Secondary | ICD-10-CM

## 2023-05-03 DIAGNOSIS — I480 Paroxysmal atrial fibrillation: Secondary | ICD-10-CM | POA: Diagnosis not present

## 2023-05-03 LAB — POCT INR: INR: 2.6 (ref 2.0–3.0)

## 2023-05-03 NOTE — Telephone Encounter (Signed)
INR due. Called pt, no answer. Unable to leave message on voicemail.

## 2023-05-03 NOTE — Patient Instructions (Signed)
Description    Spoke with patient and advised to continue taking Warfarin 1/2 tablet (2.5mg ) daily except 1 tablet (5mg ) on Mondays, Tuesday, and Thursdays.  Stay consistent with greens (3 per week).  Recheck INR in 2 weeks.  Anticoagulation Clinic [phone]

## 2023-05-07 ENCOUNTER — Other Ambulatory Visit: Payer: Self-pay

## 2023-05-07 ENCOUNTER — Other Ambulatory Visit (INDEPENDENT_AMBULATORY_CARE_PROVIDER_SITE_OTHER): Payer: Medicare Other

## 2023-05-07 ENCOUNTER — Other Ambulatory Visit: Payer: Self-pay | Admitting: "Endocrinology

## 2023-05-07 DIAGNOSIS — E1165 Type 2 diabetes mellitus with hyperglycemia: Secondary | ICD-10-CM

## 2023-05-08 ENCOUNTER — Ambulatory Visit: Payer: Medicare Other | Admitting: Physician Assistant

## 2023-05-09 ENCOUNTER — Encounter: Payer: Self-pay | Admitting: "Endocrinology

## 2023-05-09 ENCOUNTER — Ambulatory Visit: Payer: Medicare Other | Admitting: "Endocrinology

## 2023-05-09 VITALS — BP 125/75 | HR 76 | Ht 61.0 in | Wt 227.0 lb

## 2023-05-09 DIAGNOSIS — E782 Mixed hyperlipidemia: Secondary | ICD-10-CM | POA: Diagnosis not present

## 2023-05-09 DIAGNOSIS — E1165 Type 2 diabetes mellitus with hyperglycemia: Secondary | ICD-10-CM

## 2023-05-09 DIAGNOSIS — Z7984 Long term (current) use of oral hypoglycemic drugs: Secondary | ICD-10-CM | POA: Diagnosis not present

## 2023-05-09 MED ORDER — METFORMIN HCL 1000 MG PO TABS
1000.0000 mg | ORAL_TABLET | Freq: Two times a day (BID) | ORAL | 1 refills | Status: DC
Start: 2023-05-09 — End: 2023-11-27

## 2023-05-09 MED ORDER — GLIPIZIDE 10 MG PO TABS: 10.0000 mg | ORAL_TABLET | Freq: Every day | ORAL | 3 refills | Status: DC

## 2023-05-09 NOTE — Progress Notes (Signed)
Outpatient Endocrinology Note Debra Heavener, MD    Debra Wright [birth year] [id number]  Referring Provider: Wilfrid Lund, PA Primary Care Provider: Wilfrid Lund, PA Reason for consultation: Subjective  Type 2 diabetes mellitus  Assessment & Plan  Diagnoses and all orders for this visit:  Uncontrolled type 2 diabetes mellitus with hyperglycemia, without long-term current use of insulin (HCC) -     metFORMIN (GLUCOPHAGE) 1000 MG tablet; Take 1 tablet (1,000 mg total) by mouth 2 (two) times daily with a meal.  Other orders -     glipiZIDE (GLUCOTROL) 10 MG tablet; Take 1 tablet (10 mg total) by mouth daily.   Diabetes complicated by neuropathy Hba1c goal less than 7.5, current Hba1c is 7.7 Will recommend for the following change of medications to: Metformin 1000 mg twice daily Jardiance 25 mg every day Glipizide XL 10 mg every morning (hold if blood sugar is less than 100), take an extra pill at night if blood sugar is >200 Ozempic- liked it but cannot afford: Will try patient assistance No history of MEN syndrome/medullary thyroid cancer/pancreatitis or pancreatic cancer in self or family  Start gabapentin 100 mg qd upto 3 pills PRN neuropathic burning pain  Check BG alternating times of the day and bring log  No known contraindications to any of above medications  Hyperlipidemia -Last LDL near goal: 82 -on rosuvastatin 20 mg QD -Follow low fat diet and exercise   -Blood pressure goal <140/90 - Microalbumin/creatinine at goal < 30 -not on ACE/ARB -diet changes including salt restriction -limit eating outside -counseled BP targets per standards of diabetes care -Uncontrolled blood pressure can lead to retinopathy, nephropathy and cardiovascular and atherosclerotic heart disease  -Na low at 131, new finding  Speak to PCP about hyponatremia, pt is every other day spirolactone 25 mg  Pt understands and agreed  Reviewed and counseled on: -A1C target -Blood  sugar targets -Complications of uncontrolled diabetes  -Checking blood sugar before meals and bedtime and bring log next visit -All medications with mechanism of action and side effects -Hypoglycemia management: rule of 15's, Glucagon Emergency Kit and medical alert ID -low-carb low-fat plate-method diet -At least 20 minutes of physical activity per day -Annual dilated retinal eye exam and foot exam -compliance and follow up needs -follow up as scheduled or earlier if problem gets worse  Call if blood sugar is less than 70 or consistently above 250    Take a 15 gm snack of carbohydrate at bedtime before you go to sleep if your blood sugar is less than 100.    If you are going to fast after midnight for a test or procedure, ask your physician for instructions on how to reduce/decrease your insulin dose.    Call if blood sugar is less than 70 or consistently above 250  -Treating a low sugar by rule of 15  (15 gms of sugar every 15 min until sugar is more than 70) If you feel your sugar is low, test your sugar to be sure If your sugar is low (less than 70), then take 15 grams of a fast acting Carbohydrate (3-4 glucose tablets or glucose gel or 4 ounces of juice or regular soda) Recheck your sugar 15 min after treating low to make sure it is more than 70 If sugar is still less than 70, treat again with 15 grams of carbohydrate          Don't drive the hour of hypoglycemia  If unconscious/unable  to eat or drink by mouth, use glucagon injection or nasal spray baqsimi and call 911. Can repeat again in 15 min if still unconscious.  Return in about 3 months (around 08/09/2023).   I spent more than 50% of today's visit counseling patient on symptoms, examination findings, lab findings, imaging results, treatment decisions and monitoring and prognosis. The patient understood the recommendations and agrees with the treatment plan. All questions regarding treatment plan were fully answered.  Debra , MD  05/09/23    History of Present Illness Debra Wright is a 72 y.o. year old female who presents for evaluation of Type 2 diabetes mellitus.  Debra Wright was first diagnosed in 2014.   Diabetes education +  Home diabetes regimen: Metformin 1000 mg twice daily Jardiance 25 mg qd  Non-insulin hypoglycemic drugs previously used: Metformin, glipizide, repaglinide 1 mg since 3/22 (stopped in 05/2022), Januvia  COMPLICATIONS -  MI/Stroke -  retinopathy, last eye exam 2024 +  neuropathy -  nephropathy  BLOOD SUGAR DATA 100s-300s, average in 100s  Checks before meals  Physical Exam  BP 125/75   Pulse 76   Ht 5\' 1"  (1.549 m)   Wt 227 lb (103 kg)   SpO2 97%   BMI 42.89 kg/m    Constitutional: well developed, well nourished Head: normocephalic, atraumatic Eyes: sclera anicteric, no redness Neck: supple Lungs: normal respiratory effort Neurology: alert and oriented Skin: dry, bruises all over body (on warfarin for A.fib) Musculoskeletal: no appreciable defects Psychiatric: normal mood and affect Diabetic Foot Exam - Simple   No data filed      Current Medications Patient's Medications  New Prescriptions   GLIPIZIDE (GLUCOTROL) 10 MG TABLET    Take 1 tablet (10 mg total) by mouth daily.  Previous Medications   BLOOD GLUCOSE MONITORING SUPPL (ACCU-CHEK GUIDE ME) W/DEVICE KIT    USE AS DIRECTED.  Dx E11.9.   CALCIUM CARBONATE (TUMS - DOSED IN MG ELEMENTAL CALCIUM) 500 MG CHEWABLE TABLET    Chew 2 tablets by mouth daily as needed for indigestion or heartburn.   CALCIUM CARBONATE-VITAMIN D (CALCIUM 600 + D PO)    Take 1 tablet by mouth daily.   CETIRIZINE (ZYRTEC) 10 MG TABLET    Take 10 mg by mouth daily as needed for allergies.   CINNAMON PO    Take 2 tablets by mouth daily.   CLONAZEPAM (KLONOPIN) 0.5 MG TABLET    TAKE 1/2 TO 1 (ONE-HALF TO ONE) TABLET BY MOUTH AT BEDTIME AS NEEDED   CYANOCOBALAMIN (,VITAMIN B-12,) 1000 MCG/ML INJECTION    Inject 1,000  mcg into the muscle once a week.   CYANOCOBALAMIN (VITAMIN B12) 1000 MCG TBCR    Take 1,000 mcg by mouth daily.   DILTIAZEM (CARDIZEM CD) 180 MG 24 HR CAPSULE    TAKE ONE CAPSULE BY MOUTH ONE TIME DAILY   EMPAGLIFLOZIN (JARDIANCE) 25 MG TABS TABLET    Take 1 tablet (25 mg total) by mouth daily before breakfast.   ESCITALOPRAM (LEXAPRO) 10 MG TABLET    1/2 tablet once a day x 1 week, then increase to 1 tablet daily   ESZOPICLONE 3 MG TABS    Take 1 tablet (3 mg total) by mouth at bedtime as needed. Take immediately before bedtime   ESZOPICLONE 3 MG TABS    Take 3 mg by mouth at bedtime.   FERROUS SULFATE (IRON) 325 (65 FE) MG TABS    Take 325 mg by mouth 2 (two) times  daily.   FLUCONAZOLE (DIFLUCAN) 150 MG TABLET    Take 1 tablet (150 mg total) by mouth every three (3) days as needed.   GABAPENTIN (NEURONTIN) 100 MG CAPSULE    Take 1 capsule (100 mg total) by mouth as needed (neuropathic pain).   GLUCOSE BLOOD (ACCU-CHEK GUIDE) TEST STRIP    CHECK BLOOD GLUCOSE IN THE MORNING AND AT BEDTIME.  E11.9 code.   MAGNESIUM OXIDE (MAG-OX) 400 (241.3 MG) MG TABLET    Take 400 mg by mouth daily.   METOPROLOL TARTRATE (LOPRESSOR) 25 MG TABLET    Take 1 tablet (25 mg total) by mouth 2 (two) times daily.   MYRBETRIQ 25 MG TB24 TABLET    Take 25 mg by mouth daily.   OMEGA-3 FATTY ACIDS (FISH OIL) 1200 MG CAPS    Take 1,200 mg by mouth 2 (two) times daily.   OMEPRAZOLE (PRILOSEC) 40 MG CAPSULE    Take 1 capsule by mouth once daily   OXYBUTYNIN (DITROPAN) 5 MG TABLET    Take 5 mg by mouth 2 (two) times daily.   ROSUVASTATIN (CRESTOR) 20 MG TABLET    Take 1 tablet (20 mg total) by mouth daily.   SEMAGLUTIDE,0.25 OR 0.5MG /DOS, (OZEMPIC, 0.25 OR 0.5 MG/DOSE,) 2 MG/3ML SOPN    Inject 0.25 mg into the skin once a week.   SPIRONOLACTONE (ALDACTONE) 25 MG TABLET    TAKE 1/2 TABLET BY MOUTH EVERY DAY.   TOLTERODINE (DETROL) 2 MG TABLET    Take 2 mg by mouth 2 (two) times daily.   VITAMIN C (ASCORBIC ACID) 250 MG TABLET     Take 250 mg by mouth daily.   WARFARIN (COUMADIN) 5 MG TABLET    TAKE ONE-HALF TO ONE TABLET BY MOUTH DAILY AS DIRECTED BY COUMADIN CLINIC  Modified Medications   Modified Medication Previous Medication   METFORMIN (GLUCOPHAGE) 1000 MG TABLET metFORMIN (GLUCOPHAGE) 1000 MG tablet      Take 1 tablet (1,000 mg total) by mouth 2 (two) times daily with a meal.    Take 1 tablet (1,000 mg total) by mouth 2 (two) times daily with a meal.  Discontinued Medications   GLIMEPIRIDE (AMARYL) 4 MG TABLET    Take 1 tablet (4 mg total) by mouth daily before breakfast.    Allergies Allergies  Allergen Reactions   Antivert [Meclizine Hcl] Other (See Comments)    "makes me feel like my skin is falling off".  03/05/20 pt states she was placed on this a few months back and did not have a reaction   Meclizine Other (See Comments)    "makes me feel like my skin is falling off".  03/05/20 pt states she was placed on this a few months back and did not have a reaction Other reaction(s): jumping out of skin, jittery   Trazodone Hcl Other (See Comments)    Other reaction(s): nightmares   Zolpidem Other (See Comments)    Reverse affect   Zolpidem Tartrate     Other reaction(s): doing things in sleep unaware    Past Medical History Past Medical History:  Diagnosis Date   Anxiety    Arthritis    "knees; right thumb" (10/14/2014)   Basal cell carcinoma    "burned off upper lip & right shoulder"   Chronic anticoagulation    CHADS VASC=3   Complication of anesthesia    after spinal anesthesia for left knee replacement, bladder did "not wake up". Was incontinent for 4 days post surgery  Depression    Fibroid tumor    Frequent diarrhea    GERD (gastroesophageal reflux disease)    High cholesterol    Hypertension    EF 65-70% grade 2DD   Insomnia    Migraine    "frequently when I was younger; maybe monthly now" (10/14/2014) - none after hysterectomy   PAF (paroxysmal atrial fibrillation) (HCC) 05/02/2018    converted with rate control   Pernicious anemia    pt not aware of this   Sleep apnea    uses a cpap   Type II diabetes mellitus (HCC)    Vitamin B 12 deficiency     Past Surgical History Past Surgical History:  Procedure Laterality Date   ABDOMINAL HYSTERECTOMY  1988   ACHILLES TENDON SURGERY Right    APPENDECTOMY  1988   BILATERAL OOPHORECTOMY  2004   CARPAL TUNNEL RELEASE Bilateral 1986   CATARACT EXTRACTION W/ INTRAOCULAR LENS  IMPLANT, BILATERAL Bilateral 2011   COLONOSCOPY     JOINT REPLACEMENT     SHOULDER ARTHROSCOPY DISTAL CLAVICLE EXCISION AND OPEN ROTATOR CUFF REPAIR Right 11/2013   TOENAIL EXCISION Right 04/2018   "big toe"   TONSILLECTOMY  1950's   TOTAL KNEE ARTHROPLASTY Left 10/13/2014   Procedure: LEFT TOTAL KNEE ARTHROPLASTY;  Surgeon: Kathryne Hitch, MD;  Location: [facility] OR;  Service: Orthopedics;  Laterality: Left;   TOTAL KNEE ARTHROPLASTY Right 12/10/2018   Procedure: RIGHT TOTAL KNEE ARTHROPLASTY;  Surgeon: Kathryne Hitch, MD;  Location: [facility] OR;  Service: Orthopedics;  Laterality: Right;    Family History family history includes Arthritis in her father, maternal grandfather, maternal grandmother, mother, paternal grandfather, and paternal grandmother; Diabetes in her brother, father, maternal grandmother, mother, paternal grandfather, and paternal grandmother; Heart disease in her father; Hyperlipidemia in her father, maternal grandfather, maternal grandmother, mother, paternal grandfather, and paternal grandmother; Hypertension in her brother, maternal grandfather, maternal grandmother, mother, paternal grandfather, and paternal grandmother; Stroke in her brother.  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   Married       Right handed    Drinks caffeine one cup of coffee daily   An apartment    Social Determinants of Corporate investment banker Strain: Not on file  Food Insecurity: Not on file  Transportation Needs: Not on file  Physical Activity: Not on file  Stress: Not on file  Social Connections: Not on file  Intimate Partner Violence: Not on file    Lab Results  Component Value Date   HGBA1C 8.1 (H) 05/07/2023   Lab Results  Component Value Date   CHOL 122 05/07/2023   Lab Results  Component Value Date   HDL 37.20 (L) 05/07/2023   Lab Results  Component Value Date   LDLCALC 66  08/07/2018   Lab Results  Component Value Date   TRIG 245.0 (H) 05/07/2023   Lab Results  Component Value Date   CHOLHDL 3 05/07/2023   Lab Results  Component Value Date   CREATININE 0.72 05/07/2023   Lab Results  Component Value Date   GFR 82.37 05/07/2023   Lab Results  Component Value Date   MICROALBUR 1.2 05/07/2023      Component Value Date/Time   NA 137 05/07/2023 1432   NA 141 01/15/2020 1417   K 4.1 05/07/2023 1432   CL 104 05/07/2023 1432   CO2 23 05/07/2023 1432   GLUCOSE 152 (H) 05/07/2023 1432   BUN 15 05/07/2023 1432   BUN 8 01/15/2020 1417   CREATININE 0.72 05/07/2023 1432   CREATININE 0.59 (L) 11/26/2019 1319   CALCIUM 8.7 05/07/2023 1432   PROT 6.1 05/07/2023 1432   ALBUMIN 3.6 05/07/2023 1432   AST 22 05/07/2023 1432   ALT 22 05/07/2023 1432   ALKPHOS 62 05/07/2023 1432   BILITOT 0.3 05/07/2023 1432   GFRNONAA >60 07/12/2022 1906   GFRAA 110 01/15/2020 1417      Latest Ref Rng & Units 05/07/2023    2:32 PM  02/01/2023    2:23 PM 11/15/2022    1:53 PM  BMP  Glucose 70 - 99 mg/dL 409  811  914   BUN 6 - 23 mg/dL 15  14  14    Creatinine 0.40 - 1.20 mg/dL 7.82  9.56  2.13   Sodium 135 - 145 mEq/L 137  140  136   Potassium 3.5 - 5.1 mEq/L 4.1  4.1  4.3   Chloride 96 - 112 mEq/L 104  102  102   CO2 19 - 32 mEq/L 23  28  30    Calcium 8.4 - 10.5 mg/dL 8.7  9.2  9.2        Component Value Date/Time   WBC 8.8 07/12/2022 1803   RBC 4.92 07/12/2022 1803   HGB 14.2 07/12/2022 1803   HCT 44.6 07/12/2022 1803   PLT 264 07/12/2022 1803   MCV 90.7 07/12/2022 1803   MCH 28.9 07/12/2022 1803   MCHC 31.8 07/12/2022 1803   RDW 14.4 07/12/2022 1803   LYMPHSABS 2.7 07/12/2022 1803   MONOABS 0.7 07/12/2022 1803   EOSABS 0.1 07/12/2022 1803   BASOSABS 0.0 07/12/2022 1803     Parts of this note may have been dictated using voice recognition software. There may be variances in spelling and vocabulary which are unintentional. Not all errors are proofread. Please notify the Thereasa Parkin if any discrepancies are noted or if the meaning of any statement is not clear.

## 2023-05-09 NOTE — Patient Instructions (Signed)

## 2023-05-10 NOTE — Progress Notes (Signed)
I LVM for Diane letting her know that she will need to come by the office to pick up patient assistance forms to fill out

## 2023-05-16 ENCOUNTER — Other Ambulatory Visit: Payer: Self-pay | Admitting: Cardiovascular Disease

## 2023-05-17 ENCOUNTER — Ambulatory Visit (INDEPENDENT_AMBULATORY_CARE_PROVIDER_SITE_OTHER): Payer: Medicare Other | Admitting: *Deleted

## 2023-05-17 ENCOUNTER — Telehealth: Payer: Self-pay | Admitting: *Deleted

## 2023-05-17 DIAGNOSIS — I480 Paroxysmal atrial fibrillation: Secondary | ICD-10-CM | POA: Diagnosis not present

## 2023-05-17 DIAGNOSIS — Z7901 Long term (current) use of anticoagulants: Secondary | ICD-10-CM

## 2023-05-17 LAB — POCT INR: INR: 4.9 — AB (ref 2.0–3.0)

## 2023-05-17 NOTE — Telephone Encounter (Signed)
Called pt since INR is due. There was no answer therefore, left her a message to call back.

## 2023-05-17 NOTE — Patient Instructions (Addendum)
Description   Spoke with patient and advised to not take any warfarin today and no warfarin tomorrow then continue taking Warfarin 1/2 tablet (2.5mg ) daily except 1 tablet (5mg ) on Mondays, Tuesday, and Thursdays.  Stay consistent with greens (3 per week).  Recheck INR in 1 week (normally 2 weeks) Self tester.  Anticoagulation Clinic [phone]

## 2023-05-24 ENCOUNTER — Ambulatory Visit (INDEPENDENT_AMBULATORY_CARE_PROVIDER_SITE_OTHER): Payer: Medicare Other

## 2023-05-24 ENCOUNTER — Telehealth: Payer: Self-pay

## 2023-05-24 DIAGNOSIS — Z5181 Encounter for therapeutic drug level monitoring: Secondary | ICD-10-CM | POA: Diagnosis not present

## 2023-05-24 DIAGNOSIS — I480 Paroxysmal atrial fibrillation: Secondary | ICD-10-CM | POA: Diagnosis not present

## 2023-05-24 LAB — POCT INR: INR: 2.5 (ref 2.0–3.0)

## 2023-05-24 NOTE — Telephone Encounter (Signed)
 INR due. Called pt, no answer. Unable to leave message on voicemail.

## 2023-05-24 NOTE — Patient Instructions (Signed)
 Description    Spoke with patient and advised to continue taking Warfarin 1/2 tablet (2.5mg ) daily except 1 tablet (5mg ) on Mondays, Tuesday, and Thursdays.  Stay consistent with greens (3 per week).  Recheck INR in 2 weeks.  Anticoagulation Clinic [phone]

## 2023-05-28 ENCOUNTER — Other Ambulatory Visit: Payer: Self-pay | Admitting: "Endocrinology

## 2023-05-30 ENCOUNTER — Other Ambulatory Visit: Payer: Self-pay | Admitting: Cardiovascular Disease

## 2023-06-01 DIAGNOSIS — N3281 Overactive bladder: Secondary | ICD-10-CM | POA: Diagnosis not present

## 2023-06-07 ENCOUNTER — Telehealth: Payer: Self-pay

## 2023-06-07 ENCOUNTER — Ambulatory Visit (INDEPENDENT_AMBULATORY_CARE_PROVIDER_SITE_OTHER): Payer: Medicare Other | Admitting: *Deleted

## 2023-06-07 DIAGNOSIS — I480 Paroxysmal atrial fibrillation: Secondary | ICD-10-CM

## 2023-06-07 DIAGNOSIS — Z7901 Long term (current) use of anticoagulants: Secondary | ICD-10-CM

## 2023-06-07 LAB — POCT INR: INR: 2.4 (ref 2.0–3.0)

## 2023-06-07 NOTE — Telephone Encounter (Signed)
INR due. Called pt, no answer. Voice mailbox full.

## 2023-06-07 NOTE — Patient Instructions (Signed)
 Description    Spoke with patient and advised to continue taking Warfarin 1/2 tablet (2.5mg ) daily except 1 tablet (5mg ) on Mondays, Tuesday, and Thursdays.  Stay consistent with greens (3 per week).  Recheck INR in 2 weeks.  Anticoagulation Clinic [phone]

## 2023-06-21 ENCOUNTER — Telehealth: Payer: Self-pay | Admitting: *Deleted

## 2023-06-21 NOTE — Telephone Encounter (Signed)
Called pt since INR is due. There was no answer again so left another message.

## 2023-06-21 NOTE — Telephone Encounter (Signed)
Called pt since INR is due; there was answer so left a message. Will follow up.

## 2023-06-22 ENCOUNTER — Other Ambulatory Visit: Payer: Self-pay | Admitting: Cardiovascular Disease

## 2023-06-22 ENCOUNTER — Telehealth: Payer: Self-pay | Admitting: *Deleted

## 2023-06-22 NOTE — Telephone Encounter (Signed)
Pt  over due to have INR checked.  Attempted to called pt. LMOM.

## 2023-06-25 ENCOUNTER — Ambulatory Visit (INDEPENDENT_AMBULATORY_CARE_PROVIDER_SITE_OTHER): Payer: Medicare Other | Admitting: Cardiology

## 2023-06-25 DIAGNOSIS — I480 Paroxysmal atrial fibrillation: Secondary | ICD-10-CM

## 2023-06-25 DIAGNOSIS — Z7901 Long term (current) use of anticoagulants: Secondary | ICD-10-CM

## 2023-06-25 LAB — POCT INR: INR: 2.1 (ref 2.0–3.0)

## 2023-07-05 ENCOUNTER — Telehealth: Payer: Self-pay | Admitting: Cardiovascular Disease

## 2023-07-05 ENCOUNTER — Telehealth: Payer: Self-pay | Admitting: *Deleted

## 2023-07-05 NOTE — Telephone Encounter (Signed)
   Pre-operative Risk Assessment    Patient Name: Debra Wright  DOB: 1951-01-29 MRN: [id number]  Last visit 05/17/23 Next visit none      Request for Surgical Clearance    Procedure:   L5-S1 R ESI  Date of Surgery:  Clearance 07/16/23                                 Surgeon:  Dr. Lorrine Kin Surgeon's Group or Practice Name:  [facility] Phone number:  [phone] Fax number:  [phone]   Type of Clearance Requested:   - Medical  - Pharmacy:  Hold Warfarin (Coumadin) 5 days   Type of Anesthesia:  Not Indicated   Additional requests/questions:   surgeon says that patient can resume blood thinner next day  SignedRoyann Shivers   07/05/2023, 11:04 AM

## 2023-07-05 NOTE — Telephone Encounter (Signed)
I s/w the pt and she has been added on 07/10/23 ok per Joni Reining, DNP due to procedure date and med hold.

## 2023-07-05 NOTE — Telephone Encounter (Signed)
I s/w the pt and she has been added on 07/10/23 ok per Joni Reining, DNP due to procedure date and med hold.     Patient Consent for Virtual Visit        Debra Wright has provided verbal consent on 07/05/2023 for a virtual visit (video or telephone).   CONSENT FOR VIRTUAL VISIT FOR:  Debra Wright  By participating in this virtual visit I agree to the following:  I hereby voluntarily request, consent and authorize [facility] and its employed or contracted physicians, physician assistants, nurse practitioners or other licensed health care professionals (the Practitioner), to provide me with telemedicine health care services (the "Services") as deemed necessary by the treating Practitioner. I acknowledge and consent to receive the Services by the Practitioner via telemedicine. I understand that the telemedicine visit will involve communicating with the Practitioner through live audiovisual communication technology and the disclosure of certain medical information by electronic transmission. I acknowledge that I have been given the opportunity to request an in-person assessment or other available alternative prior to the telemedicine visit and am voluntarily participating in the telemedicine visit.  I understand that I have the right to withhold or withdraw my consent to the use of telemedicine in the course of my care at any time, without affecting my right to future care or treatment, and that the Practitioner or I may terminate the telemedicine visit at any time. I understand that I have the right to inspect all information obtained and/or recorded in the course of the telemedicine visit and may receive copies of available information for a reasonable fee.  I understand that some of the potential risks of receiving the Services via telemedicine include:  Delay or interruption in medical evaluation due to technological equipment failure or disruption; Information transmitted may  not be sufficient (e.g. poor resolution of images) to allow for appropriate medical decision making by the Practitioner; and/or  In rare instances, security protocols could fail, causing a breach of personal health information.  Furthermore, I acknowledge that it is my responsibility to provide information about my medical history, conditions and care that is complete and accurate to the best of my ability. I acknowledge that Practitioner's advice, recommendations, and/or decision may be based on factors not within their control, such as incomplete or inaccurate data provided by me or distortions of diagnostic images or specimens that may result from electronic transmissions. I understand that the practice of medicine is not an exact science and that Practitioner makes no warranties or guarantees regarding treatment outcomes. I acknowledge that a copy of this consent can be made available to me via my patient portal [facility] MyChart), or I can request a printed copy by calling the office of [facility].    I understand that my insurance will be billed for this visit.   I have read or had this consent read to me. I understand the contents of this consent, which adequately explains the benefits and risks of the Services being provided via telemedicine.  I have been provided ample opportunity to ask questions regarding this consent and the Services and have had my questions answered to my satisfaction. I give my informed consent for the services to be provided through the use of telemedicine in my medical care

## 2023-07-05 NOTE — Telephone Encounter (Signed)
Pharmacy please advise on holding coumadin prior to  L5-S1 R ESI scheduled for 07/16/2023. Thank you.

## 2023-07-05 NOTE — Telephone Encounter (Signed)
Patient with diagnosis of afib on warfarin for anticoagulation.    Procedure:  L5-S1 R ESI  Date of procedure: 07/16/23   CHA2DS2-VASc Score = 4   This indicates a 4.8% annual risk of stroke. The patient's score is based upon: CHF History: 0 HTN History: 1 Diabetes History: 1 Stroke History: 0 Vascular Disease History: 0 Age Score: 1 Gender Score: 1      CrCl 75 ml/min Platelet count 264  Per office protocol, patient can hold warfarin for 5 days prior to procedure.    Patient will NOT need bridging with Lovenox (enoxaparin) around procedure.  **This guidance is not considered finalized until pre-operative APP has relayed final recommendations.**

## 2023-07-05 NOTE — Telephone Encounter (Signed)
I will confirm with pre op APP ok to add on in provider over book slot as there are no available appts.

## 2023-07-05 NOTE — Telephone Encounter (Signed)
   Name: Debra Wright  DOB: [birth year]  MRN: [id number]  Primary Cardiologist: Nicki Guadalajara, MD   Preoperative team, please contact this patient and set up a phone call appointment for further preoperative risk assessment. Please obtain consent and complete medication review. Thank you for your help.Last seen by Dr. Tresa Endo on 03/23/2023. Pharm request for coumadin recommendations as been sent.   I confirm that guidance regarding antiplatelet and oral anticoagulation therapy has been completed and, if necessary, noted below.  I also confirmed the patient resides in the state of [place]. As per [facility] Medical Board telemedicine laws, the patient must reside in the state in which the provider is licensed.   Joni Reining, NP 07/05/2023, 12:16 PM [facility]

## 2023-07-08 NOTE — Progress Notes (Unsigned)
Virtual Visit via Telephone Note   Because of Debra Wright's co-morbid illnesses, she is at least at moderate risk for complications without adequate follow up.  This format is felt to be most appropriate for this patient at this time.  The patient did not have access to video technology/had technical difficulties with video requiring transitioning to audio format only (telephone).  All issues noted in this document were discussed and addressed.  No physical exam could be performed with this format.  Please refer to the patient's chart for her consent to telehealth for [facility].  Evaluation Performed:  Preoperative cardiovascular risk assessment _____________   Date:  07/10/2023   Patient ID:  Debra Wright, DOB 12-Jun-1951, MRN [id number] Patient Location:  Home Provider location:   Office  Primary Care Provider:  Wilfrid Lund, PA Primary Cardiologist:  Nicki Guadalajara, MD  Chief Complaint / Patient Profile   72 y.o. y/o female with a h/o atrial fibrillation on Coumadin, CHA2DS2-VASc score 3; followed by cardiology hypertension, chronic diastolic CHF Grade 2 type 2 diabetes, hyperlipidemia, OSA on CPAP followed by pulmonology, and anxiety.  She is pending  L5-S1 R ESI by Dr. Lorrine Kin on 07/16/2023 with [facility] and presents today for telephonic preoperative cardiovascular risk assessment.   History of Present Illness    Debra Wright is a 72 y.o. female who presents via audio/video conferencing for a telehealth visit today.  Pt was last seen in cardiology clinic on 03/23/2023 by Dr. Tresa Endo.  At that time Debra Wright was doing well had lost 10 pounds purposefully.  The patient is now pending procedure as outlined above. Since her last visit, she has not had any chest pain, palpitations, dyspnea on exertion, extreme fatigue, or swelling.  She is limited in some of her activities uses a cane for ambulation.  Able to complete light housework and ADLs without  difficulty from a cardiac standpoint.  Past Medical History    Past Medical History:  Diagnosis Date   Anxiety    Arthritis    "knees; right thumb" (10/14/2014)   Basal cell carcinoma    "burned off upper lip & right shoulder"   Chronic anticoagulation    CHADS VASC=3   Complication of anesthesia    after spinal anesthesia for left knee replacement, bladder did "not wake up". Was incontinent for 4 days post surgery   Depression    Fibroid tumor    Frequent diarrhea    GERD (gastroesophageal reflux disease)    High cholesterol    Hypertension    EF 65-70% grade 2DD   Insomnia    Migraine    "frequently when I was younger; maybe monthly now" (10/14/2014) - none after hysterectomy   PAF (paroxysmal atrial fibrillation) (HCC) 05/02/2018   converted with rate control   Pernicious anemia    pt not aware of this   Sleep apnea    uses a cpap   Type II diabetes mellitus (HCC)    Vitamin B 12 deficiency    Past Surgical History:  Procedure Laterality Date   ABDOMINAL HYSTERECTOMY  1988   ACHILLES TENDON SURGERY Right    APPENDECTOMY  1988   BILATERAL OOPHORECTOMY  2004   CARPAL TUNNEL RELEASE Bilateral 1986   CATARACT EXTRACTION W/ INTRAOCULAR LENS  IMPLANT, BILATERAL Bilateral 2011   COLONOSCOPY     JOINT REPLACEMENT     SHOULDER ARTHROSCOPY DISTAL CLAVICLE EXCISION AND OPEN ROTATOR CUFF REPAIR Right 11/2013  TOENAIL EXCISION Right 04/2018   "big toe"   TONSILLECTOMY  1950's   TOTAL KNEE ARTHROPLASTY Left 10/13/2014   Procedure: LEFT TOTAL KNEE ARTHROPLASTY;  Surgeon: Kathryne Hitch, MD;  Location[facility];  Service: Orthopedics;  Laterality: Left;   TOTAL KNEE ARTHROPLASTY Right 12/10/2018   Procedure: RIGHT TOTAL KNEE ARTHROPLASTY;  Surgeon: Kathryne Hitch, MD;  Location: [facility] OR;  Service: Orthopedics;  Laterality: Right;    Allergies  Allergies  Allergen Reactions   Antivert [Meclizine Hcl] Other (See Comments)    "makes me feel like my skin is falling  off".  03/05/20 pt states she was placed on this a few months back and did not have a reaction   Meclizine Other (See Comments)    "makes me feel like my skin is falling off".  03/05/20 pt states she was placed on this a few months back and did not have a reaction Other reaction(s): jumping out of skin, jittery   Trazodone Hcl Other (See Comments)    Other reaction(s): nightmares   Zolpidem Other (See Comments)    Reverse affect   Zolpidem Tartrate     Other reaction(s): doing things in sleep unaware    Home Medications    Prior to Admission medications   Medication Sig Start Date End Date Taking? Authorizing Provider  Blood Glucose Monitoring Suppl (ACCU-CHEK GUIDE ME) w/Device KIT USE AS DIRECTED.  Dx E11.9. 11/10/20   Hilts, Casimiro Needle, MD  calcium carbonate (TUMS - DOSED IN MG ELEMENTAL CALCIUM) 500 MG chewable tablet Chew 2 tablets by mouth daily as needed for indigestion or heartburn.    [provider]  Calcium Carbonate-Vitamin D (CALCIUM 600 + D PO) Take 1 tablet by mouth daily.    [provider]  cetirizine (ZYRTEC) 10 MG tablet Take 10 mg by mouth daily as needed for allergies.    [provider]  CINNAMON PO Take 2 tablets by mouth daily.    [provider]  clonazePAM (KLONOPIN) 0.5 MG tablet TAKE 1/2 TO 1 (ONE-HALF TO ONE) TABLET BY MOUTH AT BEDTIME AS NEEDED Patient taking differently: Take 0.25-0.5 mg by mouth daily as needed for anxiety. 11/30/20   Hilts, Casimiro Needle, MD  cyanocobalamin (,VITAMIN B-12,) 1000 MCG/ML injection Inject 1,000 mcg into the muscle once a week. Patient not taking: Reported on 05/09/2023 03/31/22   [provider]  Cyanocobalamin (VITAMIN B12) 1000 MCG TBCR Take 1,000 mcg by mouth daily.    [provider]  diltiazem (CARDIZEM CD) 180 MG 24 hr capsule TAKE ONE CAPSULE BY MOUTH ONCE A DAY 05/16/23   Lennette Bihari, MD  empagliflozin (JARDIANCE) 25 MG TABS tablet Take 1 tablet (25 mg total) by mouth daily  before breakfast. 02/05/23   Motwani, Carin Hock, MD  escitalopram (LEXAPRO) 10 MG tablet 1/2 tablet once a day x 1 week, then increase to 1 tablet daily 05/10/22   [provider]  Eszopiclone 3 MG TABS Take 1 tablet (3 mg total) by mouth at bedtime as needed. Take immediately before bedtime 11/09/20   Hilts, Michael, MD  eszopiclone 3 MG TABS Take 3 mg by mouth at bedtime. Patient not taking: Reported on 03/23/2023 10/18/22   [provider]  Ferrous Sulfate (IRON) 325 (65 Fe) MG TABS Take 325 mg by mouth 2 (two) times daily.    [provider]  fluconazole (DIFLUCAN) 150 MG tablet Take 1 tablet (150 mg total) by mouth every three (3) days as needed. 11/24/20   Hilts,  Casimiro Needle, MD  gabapentin (NEURONTIN) 100 MG capsule Take 1 capsule by mouth daily as needed (neuropathic pain) 05/28/23   Motwani, Carin Hock, MD  glipiZIDE (GLUCOTROL) 10 MG tablet Take 1 tablet (10 mg total) by mouth daily. 05/09/23   Motwani, Carin Hock, MD  glucose blood (ACCU-CHEK GUIDE) test strip CHECK BLOOD GLUCOSE IN THE MORNING AND AT BEDTIME.  E11.9 code. 05/22/22   Reather Littler, MD  magnesium oxide (MAG-OX) 400 (241.3 Mg) MG tablet Take 400 mg by mouth daily.    [provider]  metFORMIN (GLUCOPHAGE) 1000 MG tablet Take 1 tablet (1,000 mg total) by mouth 2 (two) times daily with a meal. 05/09/23   Motwani, Komal, MD  metoprolol tartrate (LOPRESSOR) 25 MG tablet TAKE ONE TABLET BY MOUTH TWICE DAILY 05/30/23   Lennette Bihari, MD  MYRBETRIQ 25 MG TB24 tablet Take 25 mg by mouth daily. Patient not taking: Reported on 07/05/2023 09/12/22   [provider]  Omega-3 Fatty Acids (FISH OIL) 1200 MG CAPS Take 1,200 mg by mouth 2 (two) times daily.    [provider]  omeprazole (PRILOSEC) 40 MG capsule Take 1 capsule by mouth once daily 12/09/20   Hilts, Michael, MD  oxybutynin (DITROPAN) 5 MG tablet Take 5 mg by mouth 2 (two) times daily.    [provider]  rosuvastatin (CRESTOR) 20 MG tablet Take 1  tablet (20 mg total) by mouth daily. 01/03/21 05/09/23  Lennette Bihari, MD  Semaglutide,0.25 or 0.5MG /DOS, (OZEMPIC, 0.25 OR 0.5 MG/DOSE,) 2 MG/3ML SOPN Inject 0.25 mg into the skin once a week. 04/25/23   Altamese [place], MD  spironolactone (ALDACTONE) 25 MG tablet take half a tablet by mouth once daily 06/22/23   Lennette Bihari, MD  tolterodine (DETROL) 2 MG tablet Take 2 mg by mouth 2 (two) times daily. Patient not taking: Reported on 03/23/2023 08/15/21   [provider]  vitamin C (ASCORBIC ACID) 250 MG tablet Take 250 mg by mouth daily.    [provider]  warfarin (COUMADIN) 5 MG tablet TAKE ONE-HALF TO ONE TABLET BY MOUTH DAILY AS DIRECTED BY COUMADIN CLINIC 05/01/23   Lennette Bihari, MD    Physical Exam    Vital Signs:  [place]aren [place] does not have vital signs available for review today.  Given telephonic nature of communication, physical exam is limited. AAOx3. NAD. Normal affect.  Speech and respirations are unlabored.  Accessory Clinical Findings    None  Assessment & Plan    1.  Preoperative Cardiovascular Risk Assessment:  According to the Revised Cardiac Risk Index (RCRI), her Perioperative Risk of Major Cardiac Event is (%): 0.9  Her Functional Capacity in METs is: 5.72 according to the Duke Activity Status Index (DASI).   Per office protocol, patient can hold warfarin for 5 days prior to procedure.   Patient will NOT need bridging with Lovenox (enoxaparin) around procedure  The patient was advised that if she develops new symptoms prior to surgery to contact our office to arrange for a follow-up visit, and she verbalized understanding.  Therefore, based on ACC/AHA guidelines, patient would be at acceptable risk for the planned procedure without further cardiovascular testing. I will route this recommendation to the requesting party via Epic fax function.    A copy of this note will be routed to requesting surgeon.   Time:   Today, I have spent 10  minutes with the patient with telehealth technology discussing medical history, symptoms, and management plan.  Joni Reining, NP  07/10/2023, 3:05 PM

## 2023-07-09 ENCOUNTER — Ambulatory Visit (INDEPENDENT_AMBULATORY_CARE_PROVIDER_SITE_OTHER): Payer: Medicare Other | Admitting: Cardiology

## 2023-07-09 DIAGNOSIS — Z7901 Long term (current) use of anticoagulants: Secondary | ICD-10-CM | POA: Diagnosis not present

## 2023-07-09 DIAGNOSIS — I480 Paroxysmal atrial fibrillation: Secondary | ICD-10-CM | POA: Diagnosis not present

## 2023-07-09 LAB — POCT INR: INR: 2.8 (ref 2.0–3.0)

## 2023-07-10 ENCOUNTER — Ambulatory Visit: Payer: Medicare Other | Attending: Cardiovascular Disease

## 2023-07-10 DIAGNOSIS — Z01810 Encounter for preprocedural cardiovascular examination: Secondary | ICD-10-CM | POA: Diagnosis not present

## 2023-07-10 DIAGNOSIS — Z01818 Encounter for other preprocedural examination: Secondary | ICD-10-CM

## 2023-07-16 ENCOUNTER — Encounter: Payer: Self-pay | Admitting: Physician Assistant

## 2023-07-16 ENCOUNTER — Other Ambulatory Visit: Payer: Self-pay | Admitting: Radiology

## 2023-07-16 ENCOUNTER — Ambulatory Visit: Payer: Medicare Other | Admitting: Physician Assistant

## 2023-07-16 DIAGNOSIS — M5416 Radiculopathy, lumbar region: Secondary | ICD-10-CM | POA: Diagnosis not present

## 2023-07-16 DIAGNOSIS — R200 Anesthesia of skin: Secondary | ICD-10-CM

## 2023-07-16 NOTE — Progress Notes (Signed)
HPI: Mrs. Hockley comes in today for right hand numbness.  He has been several years since we last saw her.  She notes that she is having numbness involving her thumb index long finger and ring finger.  No numbness or tingling in the fifth finger.  History of carpal tunnel release on the right by Dr. Richardson Landry in years ago.  She states she had 2 carpal tunnel surgeries performed on the left.  She is having no numbness tingling left hand.  She is diabetic has been diabetic for about 10 years.  Denies any neck pain or radicular symptoms down either arm.  Numbness been ongoing for at least 4 months.  Review of systems: See HPI otherwise negative or noncontributory  Physical exam: General Well-developed well-nourished female no acute distress mood affect appropriate. Psych: Alert and oriented x 3 Bilateral hands subjective decrease sensation right hand thumb through ring finger.  Normal sensation subjectively throughout the left hand.  No rashes skin lesions ulcerations either hand.  Well-healed surgical incisions from previous carpal tunnel release bilaterally.  Tinel's is negative over the median nerve bilaterally.  Positive compression test over the median nerve right hand only.  Phalen's is negative bilaterally.  Radial pulses 2+ and equal symmetric bilaterally.  Impression: Right hand numbness  Plan: Will have her undergo EMG nerve conduction studies to rule out carpal tunnel syndrome right hand.  Follow-up after the EMG nerve conduction studies to go over the results and discuss further treatment.  Questions were encouraged and answered at length.

## 2023-07-23 ENCOUNTER — Ambulatory Visit (INDEPENDENT_AMBULATORY_CARE_PROVIDER_SITE_OTHER): Payer: Medicare Other

## 2023-07-23 ENCOUNTER — Telehealth: Payer: Self-pay

## 2023-07-23 DIAGNOSIS — Z7901 Long term (current) use of anticoagulants: Secondary | ICD-10-CM

## 2023-07-23 DIAGNOSIS — I480 Paroxysmal atrial fibrillation: Secondary | ICD-10-CM

## 2023-07-23 LAB — POCT INR: INR: 2.9 (ref 2.0–3.0)

## 2023-07-23 NOTE — Patient Instructions (Signed)
 Description    Spoke with patient and advised to continue taking Warfarin 1/2 tablet (2.5mg ) daily except 1 tablet (5mg ) on Mondays, Tuesday, and Thursdays.  Stay consistent with greens (3 per week).  Recheck INR in 2 weeks.  Anticoagulation Clinic [phone]

## 2023-07-23 NOTE — Telephone Encounter (Signed)
Lpm to check INR 

## 2023-07-29 ENCOUNTER — Other Ambulatory Visit: Payer: Self-pay | Admitting: Cardiovascular Disease

## 2023-08-01 ENCOUNTER — Ambulatory Visit (INDEPENDENT_AMBULATORY_CARE_PROVIDER_SITE_OTHER): Payer: Medicare Other | Admitting: Physical Medicine and Rehabilitation

## 2023-08-01 DIAGNOSIS — Z9889 Other specified postprocedural states: Secondary | ICD-10-CM | POA: Diagnosis not present

## 2023-08-01 DIAGNOSIS — M79641 Pain in right hand: Secondary | ICD-10-CM

## 2023-08-01 DIAGNOSIS — R202 Paresthesia of skin: Secondary | ICD-10-CM

## 2023-08-01 NOTE — Progress Notes (Signed)
Functional Pain Scale - descriptive words and definitions  Mild (2)   Noticeable when not distracted/no impact on ADL's/sleep only slightly affected and able to   use both passive and active distraction for comfort. Mild range order  Average Pain 2  RUE NCS Numbness and tingling in right hand and fingers. Has some difficultly grasping.

## 2023-08-01 NOTE — Procedures (Signed)
EMG & NCV Findings: Evaluation of the right median motor nerve showed prolonged distal onset latency (8.3 ms), reduced amplitude (1.7 mV), and decreased conduction velocity (Elbow-Wrist, 41 m/s).  The right median (across palm) sensory nerve showed no response (Wrist) and prolonged distal peak latency (Palm, 4.7 ms).  All remaining nerves (as indicated in the following tables) were within normal limits.    Needle evaluation of the right abductor pollicis brevis muscle showed increased insertional activity and moderately increased spontaneous activity.  All remaining muscles (as indicated in the following table) showed no evidence of electrical instability.    Impression: The above electrodiagnostic study is ABNORMAL and reveals evidence of a severe right median nerve entrapment at the wrist (carpal tunnel syndrome) affecting sensory and motor components. There is no significant electrodiagnostic evidence of any other focal nerve entrapment, brachial plexopathy or cervical radiculopathy.   Recommendations: 1.  Follow-up with referring physician. 2.  Continue current management of symptoms. 3.  Suggest surgical evaluation.  ___________________________ Naaman Plummer FAAPMR Board Certified, American Board of Physical Medicine and Rehabilitation    Nerve Conduction Studies Anti Sensory Summary Table   Stim Site NR Peak (ms) Norm Peak (ms) P-T Amp (V) Norm P-T Amp Site1 Site2 Delta-P (ms) Dist (cm) Vel (m/s) Norm Vel (m/s)  Right Median Acr Palm Anti Sensory (2nd Digit)  29.2C  Wrist *NR  <3.6  >10 Wrist Palm  0.0    Palm    *4.7 <2.0 14.1         Right Radial Anti Sensory (Base 1st Digit)  29.5C  Wrist    2.3 <3.1 14.2  Wrist Base 1st Digit 2.3 0.0    Right Ulnar Anti Sensory (5th Digit)  30C  Wrist    3.7 <3.7 23.5 >15.0 Wrist 5th Digit 3.7 14.0 38 >38   Motor Summary Table   Stim Site NR Onset (ms) Norm Onset (ms) O-P Amp (mV) Norm O-P Amp Site1 Site2 Delta-0 (ms) Dist (cm) Vel (m/s)  Norm Vel (m/s)  Right Median Motor (Abd Poll Brev)  30.1C  Wrist    *8.3 <4.2 *1.7 >5 Elbow Wrist 5.5 22.5 *41 >50  Elbow    13.8  1.0         Right Ulnar Motor (Abd Dig Min)  30.3C  Wrist    3.4 <4.2 7.5 >3 B Elbow Wrist 3.3 20.0 61 >53  B Elbow    6.7  6.3  A Elbow B Elbow 1.4 10.0 71 >53  A Elbow    8.1  5.7          EMG   Side Muscle Nerve Root Ins Act Fibs Psw Amp Dur Poly Recrt Int Dennie Bible Comment  Right Abd Poll Brev Median C8-T1 *Incr *2+ *2+ Nml Nml 0 Nml Nml   Right 1stDorInt Ulnar C8-T1 Nml Nml Nml Nml Nml 0 Nml Nml   Right PronatorTeres Median C6-7 Nml Nml Nml Nml Nml 0 Nml Nml   Right Biceps Musculocut C5-6 Nml Nml Nml Nml Nml 0 Nml Nml     Nerve Conduction Studies Anti Sensory Left/Right Comparison   Stim Site L Lat (ms) R Lat (ms) L-R Lat (ms) L Amp (V) R Amp (V) L-R Amp (%) Site1 Site2 L Vel (m/s) R Vel (m/s) L-R Vel (m/s)  Median Acr Palm Anti Sensory (2nd Digit)  29.2C  Wrist       Wrist Palm     Palm  *4.7   14.1  Radial Anti Sensory (Base 1st Digit)  29.5C  Wrist  2.3   14.2  Wrist Base 1st Digit     Ulnar Anti Sensory (5th Digit)  30C  Wrist  3.7   23.5  Wrist 5th Digit  38    Motor Left/Right Comparison   Stim Site L Lat (ms) R Lat (ms) L-R Lat (ms) L Amp (mV) R Amp (mV) L-R Amp (%) Site1 Site2 L Vel (m/s) R Vel (m/s) L-R Vel (m/s)  Median Motor (Abd Poll Brev)  30.1C  Wrist  *8.3   *1.7  Elbow Wrist  *41   Elbow  13.8   1.0        Ulnar Motor (Abd Dig Min)  30.3C  Wrist  3.4   7.5  B Elbow Wrist  61   B Elbow  6.7   6.3  A Elbow B Elbow  71   A Elbow  8.1   5.7           Waveforms:

## 2023-08-03 ENCOUNTER — Encounter: Payer: Self-pay | Admitting: Physical Medicine and Rehabilitation

## 2023-08-03 DIAGNOSIS — G894 Chronic pain syndrome: Secondary | ICD-10-CM | POA: Insufficient documentation

## 2023-08-03 NOTE — Progress Notes (Signed)
Debra Wright - 72 y.o. female MRN [id number]  Date of birth: Dec 08, 1950  Office Visit Note: Visit Date: 08/01/2023 PCP: Wilfrid Lund, PA Referred by: Kirtland Bouchard, PA-C  Subjective: Chief Complaint  Patient presents with   Right Hand - Numbness, Tingling   HPI: Debra Wright is a 72 y.o. female who comes in today at the request of Rexene Edison, PA-C for evaluation and management of chronic, worsening and severe pain, numbness and tingling in the Right upper extremities.  Patient is Right hand dominant.  She reports progressive worsening of symptoms over the last year in the right hand with pain and numbness and tingling somewhat globally may be more in the radial digits.  She is having weakness and some difficulty grasping objects.  Pain is present essentially all the time some nocturnal complaints.  This is progressed despite medication management and activity modification.  Her history is complicated by prior right carpal tunnel release many many years ago by Dr. Eulah Pont at [facility].  I do not have any of those electrodiagnostic studies to review.  She has had 2 carpal tunnel releases on the left and there is an electrodiagnostic study from 2016 showing residual left median nerve neuropathy but it was mild.  This was done by Dr. Nita Sickle at [facility].  She is not having much in the way of any radicular symptoms down the arm.  She is not diabetic patient.  Her last hemoglobin A1c was 8.1.  Her case is also complicated by chronic pain syndrome anxiety and depression.   I spent more than 30 minutes speaking face-to-face with the patient with 50% of the time in counseling and discussing coordination of care.       Review of Systems  Musculoskeletal:  Positive for joint pain.  Neurological:  Positive for tingling and focal weakness.  All other systems reviewed and are negative.  Otherwise per HPI.  Assessment & Plan: Visit Diagnoses:    ICD-10-CM   1. Paresthesia of skin   R20.2 NCV with EMG (electromyography)    2. Pain in right hand  M79.641     3. S/p bilateral carpal tunnel release  Z98.890        Plan: Impression: Clinically this seems to be return of median neuropathy at the wrist but also likely some pain from arthritis either in the wrist or CMC joint.  Electrodiagnostic study performed today.  The above electrodiagnostic study is ABNORMAL and reveals evidence of a severe right median nerve entrapment at the wrist (carpal tunnel syndrome) affecting sensory and motor components.   There is no significant electrodiagnostic evidence of any other focal nerve entrapment, brachial plexopathy or cervical radiculopathy.   Recommendations: 1.  Follow-up with referring physician. 2.  Continue current management of symptoms. 3.  Suggest surgical evaluation.  Meds & Orders: No orders of the defined types were placed in this encounter.   Orders Placed This Encounter  Procedures   NCV with EMG (electromyography)    Follow-up: Return for Rexene Edison, P.A.-C.   Procedures: No procedures performed  EMG & NCV Findings: Evaluation of the right median motor nerve showed prolonged distal onset latency (8.3 ms), reduced amplitude (1.7 mV), and decreased conduction velocity (Elbow-Wrist, 41 m/s).  The right median (across palm) sensory nerve showed no response (Wrist) and prolonged distal peak latency (Palm, 4.7 ms).  All remaining nerves (as indicated in the following tables) were within normal limits.    Needle evaluation of the right  abductor pollicis brevis muscle showed increased insertional activity and moderately increased spontaneous activity.  All remaining muscles (as indicated in the following table) showed no evidence of electrical instability.    Impression: The above electrodiagnostic study is ABNORMAL and reveals evidence of a severe right median nerve entrapment at the wrist (carpal tunnel syndrome) affecting sensory and motor components.   There is  no significant electrodiagnostic evidence of any other focal nerve entrapment, brachial plexopathy or cervical radiculopathy.   Recommendations: 1.  Follow-up with referring physician. 2.  Continue current management of symptoms. 3.  Suggest surgical evaluation.  ___________________________ Naaman Plummer FAAPMR Board Certified, American Board of Physical Medicine and Rehabilitation    Nerve Conduction Studies Anti Sensory Summary Table   Stim Site NR Peak (ms) Norm Peak (ms) P-T Amp (V) Norm P-T Amp Site1 Site2 Delta-P (ms) Dist (cm) Vel (m/s) Norm Vel (m/s)  Right Median Acr Palm Anti Sensory (2nd Digit)  29.2C  Wrist *NR  <3.6  >10 Wrist Palm  0.0    Palm    *4.7 <2.0 14.1         Right Radial Anti Sensory (Base 1st Digit)  29.5C  Wrist    2.3 <3.1 14.2  Wrist Base 1st Digit 2.3 0.0    Right Ulnar Anti Sensory (5th Digit)  30C  Wrist    3.7 <3.7 23.5 >15.0 Wrist 5th Digit 3.7 14.0 38 >38   Motor Summary Table   Stim Site NR Onset (ms) Norm Onset (ms) O-P Amp (mV) Norm O-P Amp Site1 Site2 Delta-0 (ms) Dist (cm) Vel (m/s) Norm Vel (m/s)  Right Median Motor (Abd Poll Brev)  30.1C  Wrist    *8.3 <4.2 *1.7 >5 Elbow Wrist 5.5 22.5 *41 >50  Elbow    13.8  1.0         Right Ulnar Motor (Abd Dig Min)  30.3C  Wrist    3.4 <4.2 7.5 >3 B Elbow Wrist 3.3 20.0 61 >53  B Elbow    6.7  6.3  A Elbow B Elbow 1.4 10.0 71 >53  A Elbow    8.1  5.7          EMG   Side Muscle Nerve Root Ins Act Fibs Psw Amp Dur Poly Recrt Int Dennie Bible Comment  Right Abd Poll Brev Median C8-T1 *Incr *2+ *2+ Nml Nml 0 Nml Nml   Right 1stDorInt Ulnar C8-T1 Nml Nml Nml Nml Nml 0 Nml Nml   Right PronatorTeres Median C6-7 Nml Nml Nml Nml Nml 0 Nml Nml   Right Biceps Musculocut C5-6 Nml Nml Nml Nml Nml 0 Nml Nml     Nerve Conduction Studies Anti Sensory Left/Right Comparison   Stim Site L Lat (ms) R Lat (ms) L-R Lat (ms) L Amp (V) R Amp (V) L-R Amp (%) Site1 Site2 L Vel (m/s) R Vel (m/s) L-R Vel (m/s)  Median  Acr Palm Anti Sensory (2nd Digit)  29.2C  Wrist       Wrist Palm     Palm  *4.7   14.1        Radial Anti Sensory (Base 1st Digit)  29.5C  Wrist  2.3   14.2  Wrist Base 1st Digit     Ulnar Anti Sensory (5th Digit)  30C  Wrist  3.7   23.5  Wrist 5th Digit  38    Motor Left/Right Comparison   Stim Site L Lat (ms) R Lat (ms) L-R Lat (ms) L Amp (mV) R Amp (  mV) L-R Amp (%) Site1 Site2 L Vel (m/s) R Vel (m/s) L-R Vel (m/s)  Median Motor (Abd Poll Brev)  30.1C  Wrist  *8.3   *1.7  Elbow Wrist  *41   Elbow  13.8   1.0        Ulnar Motor (Abd Dig Min)  30.3C  Wrist  3.4   7.5  B Elbow Wrist  61   B Elbow  6.7   6.3  A Elbow B Elbow  71   A Elbow  8.1   5.7           Waveforms:             Clinical History: 09/03/2015 NCV & EMG Findings: Extensive electrodiagnostic testing of the left upper extremity shows: 1. Left median sensory response is mildly prolonged (4.1 ms) normal amplitude. Left ulnar sensory response is within normal limits. 2. Left median and ulnar motor responses are within normal limits. 3. There is no evidence of active or chronic motor axon loss changes affecting any of the tested muscles. Motor unit configuration and recruitment pattern is within normal limits.   Impression: 1. Residuals of a left median neuropathy at or distal to the wrist, consistent with the clinical diagnosis of carpal tunnel syndrome.  Overall, these findings are mild in degree electrically. 2. There is no evidence of a cervical radiculopathy affecting the left upper extremity.     _____________________________ Nita Sickle, D.O.   She reports that she has never smoked. She has never used smokeless tobacco.  Recent Labs    11/15/22 1353 02/01/23 1423 05/07/23 1432  HGBA1C 8.1* 7.7* 8.1*    Objective:  VS:  HT:    WT:   BMI:     BP:   HR: bpm  TEMP: ( )  RESP:  Physical Exam Vitals and nursing note reviewed.  Constitutional:      General: She is not in acute distress.     Appearance: Normal appearance. She is well-developed. She is obese. She is not ill-appearing.  HENT:     Head: Normocephalic and atraumatic.  Eyes:     Conjunctiva/sclera: Conjunctivae normal.     Pupils: Pupils are equal, round, and reactive to light.  Cardiovascular:     Rate and Rhythm: Normal rate.     Pulses: Normal pulses.  Pulmonary:     Effort: Pulmonary effort is normal.  Musculoskeletal:        General: Tenderness present. No swelling or deformity.     Right lower leg: No edema.     Left lower leg: No edema.     Comments: Inspection reveals well-healed surgical scars across the wrist.  Inspection also reveals some flattening of the right APB muscle but no atrophy of the bilateral  FDI or hand intrinsics. There is no swelling, color changes, allodynia or dystrophic changes. There is 5 out of 5 strength in the bilateral wrist extension, finger abduction and long finger flexion.  There is decreased sensation to light touch in the right median nerve distribution.  There is a positive Phalen's test on the right. There is a negative Hoffmann's test bilaterally.  Skin:    General: Skin is warm and dry.     Findings: No erythema or rash.  Neurological:     General: No focal deficit present.     Mental Status: She is alert and oriented to person, place, and time.     Cranial Nerves: No cranial nerve deficit.  Sensory: Sensory deficit present.     Motor: Weakness present. No abnormal muscle tone.     Coordination: Coordination normal.     Gait: Gait normal.  Psychiatric:        Mood and Affect: Mood normal.        Behavior: Behavior normal.     Ortho Exam  Imaging: No results found.  Past Medical/Family/Surgical/Social History: Medications & Allergies reviewed per EMR, new medications updated. Patient Active Problem List   Diagnosis Date Noted   Chronic pain syndrome 08/03/2023   Unilateral primary osteoarthritis, right knee 12/10/2018   Status post total right knee  replacement 12/10/2018   Long term (current) use of anticoagulants 08/16/2018   Hypertensive cardiovascular disease 06/04/2018   Morbid obesity with BMI of 40.0-44.9, adult (HCC) 06/04/2018   Fatigue 06/04/2018   Chronic anticoagulation    PAF (paroxysmal atrial fibrillation) (HCC) 05/02/2018   Primary osteoarthritis of right knee 06/14/2017   OSA (obstructive sleep apnea) 02/13/2017   Cervical radiculopathy 01/02/2017   NIDDM-type 2 10/31/2016   Essential hypertension 08/02/2016   Anxiety and depression 08/02/2016   Insomnia 08/02/2016   Arthritis of left knee 10/13/2014   Status post total left knee replacement 10/13/2014   Past Medical History:  Diagnosis Date   Anxiety    Arthritis    "knees; right thumb" (10/14/2014)   Basal cell carcinoma    "burned off upper lip & right shoulder"   Chronic anticoagulation    CHADS VASC=3   Complication of anesthesia    after spinal anesthesia for left knee replacement, bladder did "not wake up". Was incontinent for 4 days post surgery   Depression    Fibroid tumor    Frequent diarrhea    GERD (gastroesophageal reflux disease)    High cholesterol    Hypertension    EF 65-70% grade 2DD   Insomnia    Migraine    "frequently when I was younger; maybe monthly now" (10/14/2014) - none after hysterectomy   PAF (paroxysmal atrial fibrillation) (HCC) 05/02/2018   converted with rate control   Pernicious anemia    pt not aware of this   Sleep apnea    uses a cpap   Type II diabetes mellitus (HCC)    Vitamin B 12 deficiency    Family History  Problem Relation Age of Onset   Arthritis Mother    Hyperlipidemia Mother    Diabetes Mother    Hypertension Mother    Arthritis Father    Hyperlipidemia Father    Heart disease Father    Diabetes Father    Stroke Brother    Hypertension Brother    Diabetes Brother    Arthritis Maternal Grandmother    Hyperlipidemia Maternal Grandmother    Diabetes Maternal Grandmother    Hypertension  Maternal Grandmother    Arthritis Maternal Grandfather    Hyperlipidemia Maternal Grandfather    Hypertension Maternal Grandfather    Arthritis Paternal Grandmother    Hyperlipidemia Paternal Grandmother    Diabetes Paternal Grandmother    Hypertension Paternal Grandmother    Arthritis Paternal Grandfather    Hyperlipidemia Paternal Grandfather    Diabetes Paternal Grandfather    Hypertension Paternal Grandfather    Past Surgical History:  Procedure Laterality Date   ABDOMINAL HYSTERECTOMY  1988   ACHILLES TENDON SURGERY Right    APPENDECTOMY  1988   BILATERAL OOPHORECTOMY  2004   CARPAL TUNNEL RELEASE Bilateral 1986   CATARACT EXTRACTION W/ INTRAOCULAR LENS  IMPLANT, BILATERAL Bilateral  2011   COLONOSCOPY     JOINT REPLACEMENT     SHOULDER ARTHROSCOPY DISTAL CLAVICLE EXCISION AND OPEN ROTATOR CUFF REPAIR Right 11/2013   TOENAIL EXCISION Right 04/2018   "big toe"   TONSILLECTOMY  1950's   TOTAL KNEE ARTHROPLASTY Left 10/13/2014   Procedure: LEFT TOTAL KNEE ARTHROPLASTY;  Surgeon: Kathryne Hitch, MD;  Location: [facility] OR;  Service: Orthopedics;  Laterality: Left;   TOTAL KNEE ARTHROPLASTY Right 12/10/2018   Procedure: RIGHT TOTAL KNEE ARTHROPLASTY;  Surgeon: Kathryne Hitch, MD;  Location: [facility] OR;  Service: Orthopedics;  Laterality: Right;   Social History   Occupational History   Not on file  Tobacco Use   Smoking status: Never   Smokeless tobacco: Never  Vaping Use   Vaping status: Never Used  Substance and Sexual Activity   Alcohol use: Yes    Comment: 10/14/2014 "might have a drink when I'm out once/month"   Drug use: No   Sexual activity: Not Currently

## 2023-08-06 ENCOUNTER — Telehealth: Payer: Self-pay

## 2023-08-06 ENCOUNTER — Ambulatory Visit (INDEPENDENT_AMBULATORY_CARE_PROVIDER_SITE_OTHER): Payer: Medicare Other

## 2023-08-06 ENCOUNTER — Encounter: Payer: Self-pay | Admitting: "Endocrinology

## 2023-08-06 DIAGNOSIS — Z5181 Encounter for therapeutic drug level monitoring: Secondary | ICD-10-CM | POA: Diagnosis not present

## 2023-08-06 LAB — POCT INR: INR: 2.6 (ref 2.0–3.0)

## 2023-08-06 NOTE — Telephone Encounter (Signed)
Lpm to check INR 

## 2023-08-06 NOTE — Patient Instructions (Signed)
 Description    Spoke with patient and advised to continue taking Warfarin 1/2 tablet (2.5mg ) daily except 1 tablet (5mg ) on Mondays, Tuesday, and Thursdays.  Stay consistent with greens (3 per week).  Recheck INR in 2 weeks.  Anticoagulation Clinic [phone]

## 2023-08-09 ENCOUNTER — Ambulatory Visit: Payer: Medicare Other | Admitting: "Endocrinology

## 2023-08-16 ENCOUNTER — Encounter: Payer: Self-pay | Admitting: Physician Assistant

## 2023-08-16 ENCOUNTER — Ambulatory Visit: Payer: Medicare Other | Admitting: Physician Assistant

## 2023-08-16 DIAGNOSIS — G5601 Carpal tunnel syndrome, right upper limb: Secondary | ICD-10-CM

## 2023-08-16 NOTE — Progress Notes (Signed)
HPI: Debra Wright returns today to go over EMG nerve conduction studies that were performed on 08/01/2023.  These studies show severe carpal tunnel syndrome on the right.  Again she has had carpal tunnel release done in the past open by the physician who previously practiced in [place]. Therefore we will send her to Dr. Fara Boros for surgical consideration for right carpal tunnel release.  Questions were encouraged and answered.  We did provide her with appointment time and date.  Questions were encouraged and answered.

## 2023-08-20 ENCOUNTER — Telehealth: Payer: Self-pay

## 2023-08-20 ENCOUNTER — Encounter: Payer: Self-pay | Admitting: Orthopedic Surgery

## 2023-08-20 ENCOUNTER — Ambulatory Visit: Payer: Medicare Other | Admitting: Orthopedic Surgery

## 2023-08-20 NOTE — Telephone Encounter (Signed)
INR due. Called pt, no answer. Left message on voicemail.

## 2023-08-20 NOTE — Telephone Encounter (Signed)
I spoke with patient to remind her to check INR.  She informed me that she is in the ED with her husband.  She will check it as soon as able.

## 2023-08-22 ENCOUNTER — Telehealth: Payer: Self-pay | Admitting: *Deleted

## 2023-08-22 ENCOUNTER — Ambulatory Visit (INDEPENDENT_AMBULATORY_CARE_PROVIDER_SITE_OTHER): Payer: Self-pay | Admitting: *Deleted

## 2023-08-22 DIAGNOSIS — Z7901 Long term (current) use of anticoagulants: Secondary | ICD-10-CM

## 2023-08-22 DIAGNOSIS — I480 Paroxysmal atrial fibrillation: Secondary | ICD-10-CM

## 2023-08-22 LAB — POCT INR: INR: 2.9 (ref 2.0–3.0)

## 2023-08-22 NOTE — Telephone Encounter (Signed)
Called pt since INR is overdue. Her son answered and stated that she was taken a nap since she had been at the hospital for 2 day with her husband. He states he finally got her home to shower and nap before heading back to the hospital. He states once she awakes he will have her to do it. Will await and follow up.

## 2023-08-22 NOTE — Patient Instructions (Signed)
 Description    Spoke with patient and advised to continue taking Warfarin 1/2 tablet (2.5mg ) daily except 1 tablet (5mg ) on Mondays, Tuesday, and Thursdays.  Stay consistent with greens (3 per week).  Recheck INR in 2 weeks.  Anticoagulation Clinic [phone]

## 2023-08-25 ENCOUNTER — Other Ambulatory Visit: Payer: Self-pay | Admitting: "Endocrinology

## 2023-08-27 ENCOUNTER — Encounter: Payer: Self-pay | Admitting: "Endocrinology

## 2023-09-03 ENCOUNTER — Other Ambulatory Visit: Payer: Self-pay | Admitting: Cardiovascular Disease

## 2023-09-03 DIAGNOSIS — I480 Paroxysmal atrial fibrillation: Secondary | ICD-10-CM

## 2023-09-05 ENCOUNTER — Telehealth: Payer: Self-pay

## 2023-09-05 ENCOUNTER — Ambulatory Visit (INDEPENDENT_AMBULATORY_CARE_PROVIDER_SITE_OTHER): Payer: Medicare Other

## 2023-09-05 DIAGNOSIS — Z5181 Encounter for therapeutic drug level monitoring: Secondary | ICD-10-CM | POA: Diagnosis not present

## 2023-09-05 DIAGNOSIS — I480 Paroxysmal atrial fibrillation: Secondary | ICD-10-CM | POA: Diagnosis not present

## 2023-09-05 LAB — POCT INR: INR: 2.4 (ref 2.0–3.0)

## 2023-09-05 NOTE — Patient Instructions (Signed)
 Description    Spoke with patient and advised to continue taking Warfarin 1/2 tablet (2.5mg ) daily except 1 tablet (5mg ) on Mondays, Tuesday, and Thursdays.  Stay consistent with greens (3 per week).  Recheck INR in 2 weeks.  Anticoagulation Clinic [phone]

## 2023-09-05 NOTE — Telephone Encounter (Signed)
INR due. Called pt, no answer. Left message on voicemail.

## 2023-09-06 ENCOUNTER — Encounter: Payer: Self-pay | Admitting: Cardiovascular Disease

## 2023-09-06 MED ORDER — METOPROLOL TARTRATE 25 MG PO TABS: 25.0000 mg | ORAL_TABLET | Freq: Two times a day (BID) | ORAL | 3 refills | Status: DC

## 2023-09-17 ENCOUNTER — Ambulatory Visit: Payer: Medicare Other | Admitting: Orthopedic Surgery

## 2023-09-18 ENCOUNTER — Ambulatory Visit (INDEPENDENT_AMBULATORY_CARE_PROVIDER_SITE_OTHER): Payer: Self-pay | Admitting: *Deleted

## 2023-09-18 ENCOUNTER — Telehealth: Payer: Self-pay

## 2023-09-18 DIAGNOSIS — I480 Paroxysmal atrial fibrillation: Secondary | ICD-10-CM | POA: Diagnosis not present

## 2023-09-18 DIAGNOSIS — Z7901 Long term (current) use of anticoagulants: Secondary | ICD-10-CM | POA: Diagnosis not present

## 2023-09-18 LAB — POCT INR: INR: 2.8 (ref 2.0–3.0)

## 2023-09-18 NOTE — Telephone Encounter (Signed)
Lpm to check INR 

## 2023-09-18 NOTE — Patient Instructions (Signed)
 Description    Spoke with patient and advised to continue taking Warfarin 1/2 tablet (2.5mg ) daily except 1 tablet (5mg ) on Mondays, Tuesday, and Thursdays.  Stay consistent with greens (3 per week).  Recheck INR in 2 weeks.  Anticoagulation Clinic [phone]

## 2023-09-24 ENCOUNTER — Ambulatory Visit: Payer: Medicare Other | Admitting: Orthopedic Surgery

## 2023-09-24 ENCOUNTER — Other Ambulatory Visit (INDEPENDENT_AMBULATORY_CARE_PROVIDER_SITE_OTHER): Payer: Self-pay

## 2023-09-24 DIAGNOSIS — G5601 Carpal tunnel syndrome, right upper limb: Secondary | ICD-10-CM

## 2023-09-24 NOTE — Progress Notes (Unsigned)
Debra Wright - 72 y.o. female MRN [id number]  Date of birth: [birth year]  Office Visit Note: Visit Date: 09/24/2023 PCP: Wilfrid Lund, PA Referred by: Wilfrid Lund, PA  Subjective: No chief complaint on file.  HPI: Debra Wright is a pleasant 72 y.o. female who presents today for evaluation of recurrent right carpal tunnel syndrome.  She has a history of prior bilateral carpal tunnel releases done over 20 years prior, has undergone prior revision of the left carpal tunnel as well.  She states that her symptoms have recurred to the right hand radial sided digits with numbness and tingling over the past multiple months, worsening in nature.  She is having nocturnal symptoms as well ongoing.  Recent EMG did confirm evidence of severe nerve entrapment at the right wrist affecting sensory and motor components.  Pertinent ROS were reviewed with the patient and found to be negative unless otherwise specified above in HPI.   Visit Reason: right recurrent carpal tunnel syndrome Duration of symptoms: months Hand dominance: right Occupation: retired Diabetic: Yes/8.1 Smoking: No Heart/Lung History: atrial fib Blood Thinners: warfarin  Prior Testing/EMG: EMG 08/01/23 Injections (Date): none Treatments: none Prior Surgery: bilateral CTR years ago  Assessment & Plan: Visit Diagnoses:  1. Carpal tunnel syndrome, right upper limb     Plan: Extensive discussion was had the patient today regarding her right hand symptoms.  She is demonstrating clinical and electrodiagnostic evidence of recurrent right-sided carpal tunnel syndrome.  Given that her surgery on the right side was performed over 20 years prior, we did discuss that recurrence of carpal tunnel syndrome can occur in the circumstances.  We discussed risks and benefits of revision carpal tunnel surgery as well as the likely need for a hypothenar fat pad flap in order to prevent recurrent scarring around the nerve.  I did also  mention that we would like her glucose control to be well-controlled leading into surgery and in the immediate perioperative period in order to minimize complication risk.  Our goal A1c will be below 8.0 within 3 months of the operation.  She states she has plans to see her PCP in this coming month at which time she will repeat the A1c level, her most recent level was 8.1.  If her glucose control is adequate, we can move forward with surgical scheduling of right revision carpal tunnel release with hypothenar fat pad flap.  Risks and benefits of the procedure were discussed, risks including but not limited to infection, bleeding, scarring, stiffness, nerve injury, tendon injury, vascular injury, recurrence of symptoms and need for subsequent operation.  I did emphasize that there could be an element of persistent symptoms despite revision nerve release, patient expressed understanding.    Greater than 30 minutes was spent in the care of this patient reviewing previous documentation, diagnostic testing as well as in discussion and examination with patient.    Follow-up: No follow-ups on file.   Meds & Orders: No orders of the defined types were placed in this encounter.   Orders Placed This Encounter  Procedures   XR Wrist Complete Right     Procedures: No procedures performed      Clinical History: 09/03/2015 NCV & EMG Findings: Extensive electrodiagnostic testing of the left upper extremity shows: 1. Left median sensory response is mildly prolonged (4.1 ms) normal amplitude. Left ulnar sensory response is within normal limits. 2. Left median and ulnar motor responses are within normal limits. 3. There is no evidence of  active or chronic motor axon loss changes affecting any of the tested muscles. Motor unit configuration and recruitment pattern is within normal limits.   Impression: 1. Residuals of a left median neuropathy at or distal to the wrist, consistent with the clinical diagnosis  of carpal tunnel syndrome.  Overall, these findings are mild in degree electrically. 2. There is no evidence of a cervical radiculopathy affecting the left upper extremity.     _____________________________ Nita Sickle, D.O.  She reports that she has never smoked. She has never used smokeless tobacco.  Recent Labs    11/15/22 1353 02/01/23 1423 05/07/23 1432  HGBA1C 8.1* 7.7* 8.1*    Objective:   Vital Signs: There were no vitals taken for this visit.  Physical Exam  Gen: Well-appearing, in no acute distress; non-toxic CV: Regular Rate. Well-perfused. Warm.  Resp: Breathing unlabored on room air; no wheezing. Psych: Fluid speech in conversation; appropriate affect; normal thought process  Ortho Exam PHYSICAL EXAM:  General: Patient is well appearing and in no distress. Cervical spine mobility is full in all directions:  Skin and Muscle: Well-healed prior bilateral carpal tunnel incisions.    Range of Motion and Palpation Tests: Mobility is full about the elbows with flexion and extension.  Forearm supination and pronation are 85/85 bilaterally.  Wrist flexion/extension is 75/65 bilaterally.  Digital flexion and extension are full.  Thumb opposition is full to the base of the small fingers bilaterally.    No cords or nodules are palpated.  No triggering is observed.    Neurologic, Vascular, Motor: Sensation is diminished to light touch in the right median nerve distribution.    Thenar atrophy: Positive bilateral, more pronounced right side Tinel sign: Positive right carpal tunnel Carpal tunnel compression: Positive right Phalen test: Positive right  Sensory right hand 2-point discrimination (thumb, index, middle): Greater than 8 mm  Motor right hand APB: 4/5  Fingers pink and well perfused.  Capillary refill is brisk.     Lab Results  Component Value Date   HGBA1C 8.1 (H) 05/07/2023     Imaging: X-rays of right wrist obtained today  Past  Medical/Family/Surgical/Social History: Medications & Allergies reviewed per EMR, new medications updated. Patient Active Problem List   Diagnosis Date Noted   Chronic pain syndrome 08/03/2023   Unilateral primary osteoarthritis, right knee 12/10/2018   Status post total right knee replacement 12/10/2018   Long term (current) use of anticoagulants 08/16/2018   Hypertensive cardiovascular disease 06/04/2018   Morbid obesity with BMI of 40.0-44.9, adult (HCC) 06/04/2018   Fatigue 06/04/2018   Chronic anticoagulation    PAF (paroxysmal atrial fibrillation) (HCC) 05/02/2018   Primary osteoarthritis of right knee 06/14/2017   OSA (obstructive sleep apnea) 02/13/2017   Cervical radiculopathy 01/02/2017   NIDDM-type 2 10/31/2016   Essential hypertension 08/02/2016   Anxiety and depression 08/02/2016   Insomnia 08/02/2016   Arthritis of left knee 10/13/2014   Status post total left knee replacement 10/13/2014   Past Medical History:  Diagnosis Date   Anxiety    Arthritis    "knees; right thumb" (10/14/2014)   Basal cell carcinoma    "burned off upper lip & right shoulder"   Chronic anticoagulation    CHADS VASC=3   Complication of anesthesia    after spinal anesthesia for left knee replacement, bladder did "not wake up". Was incontinent for 4 days post surgery   Depression    Fibroid tumor    Frequent diarrhea    GERD (gastroesophageal reflux  disease)    High cholesterol    Hypertension    EF 65-70% grade 2DD   Insomnia    Migraine    "frequently when I was younger; maybe monthly now" (10/14/2014) - none after hysterectomy   PAF (paroxysmal atrial fibrillation) (HCC) 05/02/2018   converted with rate control   Pernicious anemia    pt not aware of this   Sleep apnea    uses a cpap   Type II diabetes mellitus (HCC)    Vitamin B 12 deficiency    Family History  Problem Relation Age of Onset   Arthritis Mother    Hyperlipidemia Mother    Diabetes Mother    Hypertension  Mother    Arthritis Father    Hyperlipidemia Father    Heart disease Father    Diabetes Father    Stroke Brother    Hypertension Brother    Diabetes Brother    Arthritis Maternal Grandmother    Hyperlipidemia Maternal Grandmother    Diabetes Maternal Grandmother    Hypertension Maternal Grandmother    Arthritis Maternal Grandfather    Hyperlipidemia Maternal Grandfather    Hypertension Maternal Grandfather    Arthritis Paternal Grandmother    Hyperlipidemia Paternal Grandmother    Diabetes Paternal Grandmother    Hypertension Paternal Grandmother    Arthritis Paternal Grandfather    Hyperlipidemia Paternal Grandfather    Diabetes Paternal Grandfather    Hypertension Paternal Grandfather    Past Surgical History:  Procedure Laterality Date   ABDOMINAL HYSTERECTOMY  1988   ACHILLES TENDON SURGERY Right    APPENDECTOMY  1988   BILATERAL OOPHORECTOMY  2004   CARPAL TUNNEL RELEASE Bilateral 1986   CATARACT EXTRACTION W/ INTRAOCULAR LENS  IMPLANT, BILATERAL Bilateral 2011   COLONOSCOPY     JOINT REPLACEMENT     SHOULDER ARTHROSCOPY DISTAL CLAVICLE EXCISION AND OPEN ROTATOR CUFF REPAIR Right 11/2013   TOENAIL EXCISION Right 04/2018   "big toe"   TONSILLECTOMY  1950's   TOTAL KNEE ARTHROPLASTY Left 10/13/2014   Procedure: LEFT TOTAL KNEE ARTHROPLASTY;  Surgeon: Kathryne Hitch, MD;  Location: [facility] OR;  Service: Orthopedics;  Laterality: Left;   TOTAL KNEE ARTHROPLASTY Right 12/10/2018   Procedure: RIGHT TOTAL KNEE ARTHROPLASTY;  Surgeon: Kathryne Hitch, MD;  Location: [facility] OR;  Service: Orthopedics;  Laterality: Right;   Social History   Occupational History   Not on file  Tobacco Use   Smoking status: Never   Smokeless tobacco: Never  Vaping Use   Vaping status: Never Used  Substance and Sexual Activity   Alcohol use: Yes    Comment: 10/14/2014 "might have a drink when I'm out once/month"   Drug use: No   Sexual activity: Not Currently    Yomaira Solar Fara Boros)  Denese Killings, M.D.  [facility] 6:34 PM

## 2023-10-02 ENCOUNTER — Telehealth: Payer: Self-pay | Admitting: *Deleted

## 2023-10-02 ENCOUNTER — Ambulatory Visit (INDEPENDENT_AMBULATORY_CARE_PROVIDER_SITE_OTHER): Payer: Medicare Other | Admitting: Pharmacist

## 2023-10-02 DIAGNOSIS — Z7901 Long term (current) use of anticoagulants: Secondary | ICD-10-CM

## 2023-10-02 DIAGNOSIS — I480 Paroxysmal atrial fibrillation: Secondary | ICD-10-CM

## 2023-10-02 LAB — POCT INR: INR: 4.3 — AB (ref 2.0–3.0)

## 2023-10-02 NOTE — Patient Instructions (Signed)
 Description   Spoke with patient and advised to hold today and tomorrow then continue taking Warfarin 1/2 tablet (2.5mg ) daily except 1 tablet (5mg ) on Mondays, Tuesday, and Thursdays.  Stay consistent with greens (3 per week).  Recheck INR in 2 weeks.  [facility] [phone]

## 2023-10-02 NOTE — Telephone Encounter (Signed)
Called pt since INR is due; there was no answer so left her a message. Will await and follow up.

## 2023-10-08 ENCOUNTER — Other Ambulatory Visit: Payer: Self-pay | Admitting: "Endocrinology

## 2023-10-12 ENCOUNTER — Telehealth: Payer: Self-pay

## 2023-10-12 NOTE — Telephone Encounter (Signed)
   Pre-operative Risk Assessment    Patient Name: Debra Wright  DOB: 09-13-1951 MRN: [id number]   Date of last office visit: 03/26/23 Date of next office visit: none   Request for Surgical Clearance    Procedure:  L5-S1 right ESI  Date of Surgery:  Clearance TBD                                 Surgeon:  Dr. Darlis Socks Group or Practice Name:  [facility]  Phone number:  [phone] Fax number:  [phone]   Type of Clearance Requested:   - Pharmacy:  Hold Warfarin (Coumadin ) 5 days after    Type of Anesthesia:  Not Indicated   Additional requests/questions:    [name]   10/12/2023, 2:38 PM

## 2023-10-12 NOTE — Telephone Encounter (Signed)
 Please advise holding coumadin prior to L5-S1 ESI  Thank you!  DW

## 2023-10-15 ENCOUNTER — Telehealth: Payer: Self-pay | Admitting: *Deleted

## 2023-10-15 ENCOUNTER — Other Ambulatory Visit: Payer: Self-pay | Admitting: "Endocrinology

## 2023-10-15 NOTE — Telephone Encounter (Signed)
 Patient with diagnosis of afib on warfarin for anticoagulation.    Procedure: L5-S1 ESI  Date of procedure: TBD   CHA2DS2-VASc Score = 4   This indicates a 4.8% annual risk of stroke. The patient's score is based upon: CHF History: 0 HTN History: 1 Diabetes History: 1 Stroke History: 0 Vascular Disease History: 0 Age Score: 1 Gender Score: 1       Per office protocol, patient can hold warfarin for 5 days prior to procedure.    Patient will NOT need bridging with Lovenox (enoxaparin) around procedure.  **This guidance is not considered finalized until pre-operative APP has relayed final recommendations.**

## 2023-10-15 NOTE — Telephone Encounter (Signed)
   Name: Debra Wright  DOB: 01-19-1951  MRN: [id number]  Primary Cardiologist: Debby Sor, MD   Preoperative team, please contact this patient and set up a phone call appointment for further preoperative risk assessment. Please obtain consent and complete medication review. Thank you for your help.  I confirm that guidance regarding antiplatelet and oral anticoagulation therapy has been completed and, if necessary, noted below  CHA2DS2-VASc Score = 4   This indicates a 4.8% annual risk of stroke. The patient's score is based upon: CHF History: 0 HTN History: 1 Diabetes History: 1 Stroke History: 0 Vascular Disease History: 0 Age Score: 1 Gender Score: 1       Per office protocol, patient can hold warfarin for 5 days prior to procedure.     Patient will NOT need bridging with Lovenox (enoxaparin) around procedure.  I also confirmed the patient resides in the state of [place] . As per [facility] telemedicine laws, the patient must reside in the state in which the provider is licensed.   Lamarr Satterfield, NP 10/15/2023, 4:46 PM [facility]

## 2023-10-15 NOTE — Telephone Encounter (Signed)
 Patient with diagnosis of afib on warfarin for anticoagulation.    Procedure: Right Open Carpal Tunnel Release revision and hypothenar fat flap .  Date of procedure: TBD   CHA2DS2-VASc Score = 4   This indicates a 4.8% annual risk of stroke. The patient's score is based upon: CHF History: 0 HTN History: 1 Diabetes History: 1 Stroke History: 0 Vascular Disease History: 0 Age Score: 1 Gender Score: 1      Per office protocol, patient can hold warfarin for 5 days prior to procedure.    Patient will NOT need bridging with Lovenox (enoxaparin) around procedure.  **This guidance is not considered finalized until pre-operative APP has relayed final recommendations.**

## 2023-10-15 NOTE — Telephone Encounter (Signed)
   Patient Name: Debra Wright  DOB: 1951-09-09 MRN: [id number]  Primary Cardiologist: Debby Sor, MD  Chart reviewed as part of pre-operative protocol coverage. Pre-op clearance already addressed by colleagues in earlier phone notes. To summarize recommendations:  -Per office protocol, patient can hold warfarin for 5 days prior to procedure.  Patient will NOT need bridging with Lovenox (enoxaparin) around procedure.  Please resume when medically safe to do so.  Medical clearance was not requested but she was recently in the office in October and clearance was provided at that time.    Will route this bundled recommendation to requesting provider via Epic fax function and remove from pre-op pool. Please call with questions.  Orren LOISE Fabry, PA-C 10/15/2023, 8:34 AM

## 2023-10-15 NOTE — Telephone Encounter (Signed)
   Pre-operative Risk Assessment    Patient Name: Debra Wright  DOB: 1950-11-03 MRN: [id number]   Date of last office visit: June 21,2024. Date of next office visit: None   Request for Surgical Clearance    Procedure:   Right Open Carpal Tunnel Release revision and hypothenar fat flap .  Date of Surgery:  Clearance TBD                                 Surgeon:  Dr. Gildardo Alderton Surgeon's Group or Practice Name:  [facility] Phone number:  [phone] Fax number:  [phone]   Type of Clearance Requested:   - Medical  - Pharmacy:  Hold Warfarin (Coumadin ) 5 days prior.   Type of Anesthesia:  General    Additional requests/questions:    Signed, Edsel Grayce Sanders   10/15/2023, 10:03 AM

## 2023-10-16 ENCOUNTER — Telehealth: Payer: Self-pay | Admitting: *Deleted

## 2023-10-16 ENCOUNTER — Ambulatory Visit (INDEPENDENT_AMBULATORY_CARE_PROVIDER_SITE_OTHER): Payer: Medicare Other | Admitting: Cardiology

## 2023-10-16 DIAGNOSIS — Z7901 Long term (current) use of anticoagulants: Secondary | ICD-10-CM | POA: Diagnosis not present

## 2023-10-16 DIAGNOSIS — I480 Paroxysmal atrial fibrillation: Secondary | ICD-10-CM | POA: Diagnosis not present

## 2023-10-16 LAB — POCT INR: INR: 8 — AB (ref 2.0–3.0)

## 2023-10-16 NOTE — Telephone Encounter (Signed)
Called pt since INR is due. She states she will get it done soon Will await and follow up.

## 2023-10-16 NOTE — Telephone Encounter (Signed)
 Left message to call back to schedule tele pre op appt.

## 2023-10-17 ENCOUNTER — Telehealth: Payer: Self-pay

## 2023-10-17 NOTE — Telephone Encounter (Signed)
 Preop televisit now scheduled, med rec and consent done.

## 2023-10-17 NOTE — Telephone Encounter (Signed)
  Patient Consent for Virtual Visit        Debra Wright has provided verbal consent on 10/17/2023 for a virtual visit (video or telephone).   CONSENT FOR VIRTUAL VISIT FOR:  Debra Wright  By participating in this virtual visit I agree to the following:  I hereby voluntarily request, consent and authorize [facility] and its employed or contracted physicians, physician assistants, nurse practitioners or other licensed health care professionals (the Practitioner), to provide me with telemedicine health care services (the "Services") as deemed necessary by the treating Practitioner. I acknowledge and consent to receive the Services by the Practitioner via telemedicine. I understand that the telemedicine visit will involve communicating with the Practitioner through live audiovisual communication technology and the disclosure of certain medical information by electronic transmission. I acknowledge that I have been given the opportunity to request an in-person assessment or other available alternative prior to the telemedicine visit and am voluntarily participating in the telemedicine visit.  I understand that I have the right to withhold or withdraw my consent to the use of telemedicine in the course of my care at any time, without affecting my right to future care or treatment, and that the Practitioner or I may terminate the telemedicine visit at any time. I understand that I have the right to inspect all information obtained and/or recorded in the course of the telemedicine visit and may receive copies of available information for a reasonable fee.  I understand that some of the potential risks of receiving the Services via telemedicine include:  Delay or interruption in medical evaluation due to technological equipment failure or disruption; Information transmitted may not be sufficient (e.g. poor resolution of images) to allow for appropriate medical decision making by the  Practitioner; and/or  In rare instances, security protocols could fail, causing a breach of personal health information.  Furthermore, I acknowledge that it is my responsibility to provide information about my medical history, conditions and care that is complete and accurate to the best of my ability. I acknowledge that Practitioner's advice, recommendations, and/or decision may be based on factors not within their control, such as incomplete or inaccurate data provided by me or distortions of diagnostic images or specimens that may result from electronic transmissions. I understand that the practice of medicine is not an exact science and that Practitioner makes no warranties or guarantees regarding treatment outcomes. I acknowledge that a copy of this consent can be made available to me via my patient portal [facility] MyChart), or I can request a printed copy by calling the office of [facility].    I understand that my insurance will be billed for this visit.   I have read or had this consent read to me. I understand the contents of this consent, which adequately explains the benefits and risks of the Services being provided via telemedicine.  I have been provided ample opportunity to ask questions regarding this consent and the Services and have had my questions answered to my satisfaction. I give my informed consent for the services to be provided through the use of telemedicine in my medical care

## 2023-10-19 ENCOUNTER — Telehealth: Payer: Self-pay

## 2023-10-19 ENCOUNTER — Ambulatory Visit (INDEPENDENT_AMBULATORY_CARE_PROVIDER_SITE_OTHER): Payer: Medicare Other | Admitting: Cardiovascular Disease

## 2023-10-19 DIAGNOSIS — I480 Paroxysmal atrial fibrillation: Secondary | ICD-10-CM | POA: Diagnosis not present

## 2023-10-19 DIAGNOSIS — Z7901 Long term (current) use of anticoagulants: Secondary | ICD-10-CM

## 2023-10-19 LAB — POCT INR: INR: 4.5 — AB (ref 2.0–3.0)

## 2023-10-19 NOTE — Telephone Encounter (Signed)
Reminded patient to check INR ?

## 2023-10-23 ENCOUNTER — Emergency Department (HOSPITAL_BASED_OUTPATIENT_CLINIC_OR_DEPARTMENT_OTHER): Payer: Medicare Other

## 2023-10-23 ENCOUNTER — Encounter (HOSPITAL_BASED_OUTPATIENT_CLINIC_OR_DEPARTMENT_OTHER): Payer: Self-pay | Admitting: Emergency Medicine

## 2023-10-23 ENCOUNTER — Emergency Department (HOSPITAL_BASED_OUTPATIENT_CLINIC_OR_DEPARTMENT_OTHER): Payer: Medicare Other | Admitting: Radiology

## 2023-10-23 ENCOUNTER — Emergency Department (HOSPITAL_BASED_OUTPATIENT_CLINIC_OR_DEPARTMENT_OTHER)
Admission: EM | Admit: 2023-10-23 | Discharge: 2023-10-23 | Disposition: A | Discharge status: Home / Self Care | Payer: Medicare Other | Attending: Emergency Medicine | Admitting: Emergency Medicine

## 2023-10-23 ENCOUNTER — Other Ambulatory Visit: Payer: Self-pay

## 2023-10-23 DIAGNOSIS — W19XXXA Unspecified fall, initial encounter: Secondary | ICD-10-CM | POA: Insufficient documentation

## 2023-10-23 DIAGNOSIS — Y92002 Bathroom of unspecified non-institutional (private) residence single-family (private) house as the place of occurrence of the external cause: Secondary | ICD-10-CM | POA: Insufficient documentation

## 2023-10-23 DIAGNOSIS — Z7901 Long term (current) use of anticoagulants: Secondary | ICD-10-CM | POA: Insufficient documentation

## 2023-10-23 DIAGNOSIS — Y9301 Activity, walking, marching and hiking: Secondary | ICD-10-CM | POA: Insufficient documentation

## 2023-10-23 DIAGNOSIS — Z7984 Long term (current) use of oral hypoglycemic drugs: Secondary | ICD-10-CM | POA: Insufficient documentation

## 2023-10-23 DIAGNOSIS — E11649 Type 2 diabetes mellitus with hypoglycemia without coma: Secondary | ICD-10-CM | POA: Diagnosis not present

## 2023-10-23 DIAGNOSIS — M25552 Pain in left hip: Secondary | ICD-10-CM | POA: Insufficient documentation

## 2023-10-23 DIAGNOSIS — S0990XA Unspecified injury of head, initial encounter: Secondary | ICD-10-CM | POA: Diagnosis not present

## 2023-10-23 DIAGNOSIS — Z794 Long term (current) use of insulin: Secondary | ICD-10-CM | POA: Insufficient documentation

## 2023-10-23 DIAGNOSIS — I878 Other specified disorders of veins: Secondary | ICD-10-CM | POA: Diagnosis not present

## 2023-10-23 DIAGNOSIS — M47816 Spondylosis without myelopathy or radiculopathy, lumbar region: Secondary | ICD-10-CM | POA: Diagnosis not present

## 2023-10-23 DIAGNOSIS — E162 Hypoglycemia, unspecified: Secondary | ICD-10-CM | POA: Diagnosis not present

## 2023-10-23 DIAGNOSIS — Z043 Encounter for examination and observation following other accident: Secondary | ICD-10-CM | POA: Diagnosis not present

## 2023-10-23 DIAGNOSIS — S199XXA Unspecified injury of neck, initial encounter: Secondary | ICD-10-CM | POA: Diagnosis not present

## 2023-10-23 LAB — CBG MONITORING, ED
Glucose-Capillary: 57 mg/dL — ABNORMAL LOW (ref 70–99)
Glucose-Capillary: 113 mg/dL — ABNORMAL HIGH (ref 70–99)

## 2023-10-23 MED ORDER — DEXTROSE 50 % IV SOLN
50.0000 mL | Freq: Once | INTRAVENOUS | Status: DC
  Filled 2023-10-23: qty 50

## 2023-10-23 NOTE — Discharge Instructions (Addendum)
You were evaluated in the emergency room following a mechanical fall.  Imaging of your head, neck and pelvis did not show any acute abnormality.  You can use Tylenol 1000 mg for pain up to 3 times a day for pain.  During your visit you were found to be hypoglycemic, you were given oral glucose replenishment.  If you experience any new or worsening symptoms including dizziness, vomiting, blurry vision, weakness please return to the emergency room.  Otherwise please follow-up with your primary care doctor as scheduled.

## 2023-10-23 NOTE — ED Provider Notes (Signed)
[facility] Provider Note   CSN: [id number] Arrival date & time: 10/23/23  1423     History  Chief Complaint  Patient presents with   Debra Wright is a 73 y.o. female who presents following mechanical fall.  States she was at her primary care provider's office and slipped in the bathroom.  She states she landed on her left hip and tapped her head on the wall.  Reports she did not lose consciousness.  She is on warfarin.  She denies any headache, blurry vision, dizziness, extremity weakness or numbness.  No neck pain.  She was able to ambulate from the walker to the bed with some mild discomfort.  Has pain in her left hip with leg movement.    Fall       Home Medications Prior to Admission medications   Medication Sig Start Date End Date Taking? Authorizing Provider  Blood Glucose Monitoring Suppl (ACCU-CHEK GUIDE ME) w/Device KIT USE AS DIRECTED.  Dx E11.9. 11/10/20   Hilts, Casimiro Needle, MD  calcium carbonate (TUMS - DOSED IN MG ELEMENTAL CALCIUM) 500 MG chewable tablet Chew 2 tablets by mouth daily as needed for indigestion or heartburn.    [provider]  Calcium Carbonate-Vitamin D (CALCIUM 600 + D PO) Take 1 tablet by mouth daily.    [provider]  cetirizine (ZYRTEC) 10 MG tablet Take 10 mg by mouth daily as needed for allergies.    [provider]  CINNAMON PO Take 2 tablets by mouth daily.    [provider]  clonazePAM (KLONOPIN) 0.5 MG tablet TAKE 1/2 TO 1 (ONE-HALF TO ONE) TABLET BY MOUTH AT BEDTIME AS NEEDED Patient taking differently: Take 0.25-0.5 mg by mouth daily as needed for anxiety. 11/30/20   Hilts, Casimiro Needle, MD  cyanocobalamin (,VITAMIN B-12,) 1000 MCG/ML injection Inject 1,000 mcg into the muscle once a week. 03/31/22   [provider]  Cyanocobalamin (VITAMIN B12) 1000 MCG TBCR Take 1,000 mcg by mouth daily.    [provider]  diltiazem (CARDIZEM CD) 180 MG  24 hr capsule TAKE ONE CAPSULE BY MOUTH ONCE A DAY 05/16/23   Lennette Bihari, MD  escitalopram (LEXAPRO) 10 MG tablet 1/2 tablet once a day x 1 week, then increase to 1 tablet daily 05/10/22   [provider]  Eszopiclone 3 MG TABS Take 1 tablet (3 mg total) by mouth at bedtime as needed. Take immediately before bedtime 11/09/20   Hilts, Michael, MD  eszopiclone 3 MG TABS Take 3 mg by mouth at bedtime. 10/18/22   [provider]  Ferrous Sulfate (IRON) 325 (65 Fe) MG TABS Take 325 mg by mouth 2 (two) times daily.    [provider]  fluconazole (DIFLUCAN) 150 MG tablet Take 1 tablet (150 mg total) by mouth every three (3) days as needed. 11/24/20   Hilts, Casimiro Needle, MD  gabapentin (NEURONTIN) 100 MG capsule Take 1 capsule by mouth daily as needed (neuropathic pain) 10/18/23   Motwani, Carin Hock, MD  glipiZIDE (GLUCOTROL) 10 MG tablet Take 1 tablet (10 mg total) by mouth daily. 05/09/23   Motwani, Carin Hock, MD  glucose blood (ACCU-CHEK GUIDE) test strip CHECK BLOOD GLUCOSE IN THE MORNING AND AT BEDTIME.  E11.9 code. 05/22/22   Reather Littler, MD  JARDIANCE 25 MG TABS tablet Take 1 tablet by mouth daily before breakfast 08/28/23   Altamese Lake Hughes, MD  magnesium oxide (MAG-OX) 400 (241.3 Mg) MG tablet Take 400  mg by mouth daily.    [provider]  metFORMIN (GLUCOPHAGE) 1000 MG tablet Take 1 tablet (1,000 mg total) by mouth 2 (two) times daily with a meal. 05/09/23   Motwani, Komal, MD  metoprolol tartrate (LOPRESSOR) 25 MG tablet Take 1 tablet (25 mg total) by mouth 2 (two) times daily. 09/06/23   Lennette Bihari, MD  MYRBETRIQ 25 MG TB24 tablet Take 25 mg by mouth daily. 09/12/22   [provider]  Omega-3 Fatty Acids (FISH OIL) 1200 MG CAPS Take 1,200 mg by mouth 2 (two) times daily.    [provider]  omeprazole (PRILOSEC) 40 MG capsule Take 1 capsule by mouth once daily 12/09/20   Hilts, Michael, MD  oxybutynin (DITROPAN) 5 MG tablet Take 5 mg by mouth 2 (two) times  daily.    [provider]  rosuvastatin (CRESTOR) 20 MG tablet Take 1 tablet (20 mg total) by mouth daily. 01/03/21 05/09/23  Lennette Bihari, MD  Semaglutide,0.25 or 0.5MG /DOS, (OZEMPIC, 0.25 OR 0.5 MG/DOSE,) 2 MG/3ML SOPN Inject 0.25 mg into the skin once a week. 04/25/23   Altamese Gallia, MD  spironolactone (ALDACTONE) 25 MG tablet TAKE ONE-HALF TABLET BY MOUTH ONCE DAILY 07/30/23   Lennette Bihari, MD  tolterodine (DETROL) 2 MG tablet Take 2 mg by mouth 2 (two) times daily. 08/15/21   [provider]  vitamin C (ASCORBIC ACID) 250 MG tablet Take 250 mg by mouth daily.    [provider]  warfarin (COUMADIN) 5 MG tablet TAKE ONE-HALF TO ONE TABLET BY MOUTH DAILY AS DIRECTED BY COUMADIN CLINIC 09/03/23   Lennette Bihari, MD      Allergies    Antivert [meclizine hcl], Meclizine, Trazodone hcl, Zolpidem, and Zolpidem tartrate    Review of Systems   Review of Systems  Musculoskeletal:  Positive for arthralgias and myalgias.    Physical Exam Updated Vital Signs BP 102/74 (BP Location: Right Arm)   Pulse 68   Temp 98.3 F (36.8 C) (Oral)   Resp 18   SpO2 98%  Physical Exam Vitals and nursing note reviewed.  Constitutional:      General: She is not in acute distress.    Appearance: She is well-developed.  HENT:     Head: Normocephalic and atraumatic.  Eyes:     Extraocular Movements: Extraocular movements intact.     Conjunctiva/sclera: Conjunctivae normal.     Pupils: Pupils are equal, round, and reactive to light.  Cardiovascular:     Rate and Rhythm: Normal rate and regular rhythm.     Heart sounds: No murmur heard. Pulmonary:     Effort: Pulmonary effort is normal. No respiratory distress.     Breath sounds: Normal breath sounds.  Abdominal:     Palpations: Abdomen is soft.     Tenderness: There is no abdominal tenderness.  Musculoskeletal:     Cervical back: Normal range of motion and neck supple. No rigidity or tenderness.     Comments: Mild  left-sided hip tenderness.  No gross deformity ,significant swelling or ecchymosis moves all extremities without difficulty.  No midline spinal tenderness or step-offs.  Radial and DP pulses symmetric.  She is NVI.  No pain with rib compression.   Skin:    General: Skin is warm and dry.     Capillary Refill: Capillary refill takes less than 2 seconds.  Neurological:     General: No focal deficit present.     Mental Status: She is alert and  oriented to person, place, and time. Mental status is at baseline.     Cranial Nerves: No cranial nerve deficit.     Sensory: No sensory deficit.     Motor: No weakness.     Coordination: Coordination normal.     Comments: No abnormal speech, there is no aphasia or facial droop  Psychiatric:        Mood and Affect: Mood normal.     ED Results / Procedures / Treatments   Labs (all labs ordered are listed, but only abnormal results are displayed) Labs Reviewed  CBG MONITORING, ED - Abnormal; Notable for the following components:      Result Value   Glucose-Capillary 57 (*)    All other components within normal limits  CBG MONITORING, ED - Abnormal; Notable for the following components:   Glucose-Capillary 113 (*)    All other components within normal limits     EKG None  Radiology DG HIP UNILAT WITH PELVIS 2-3 VIEWS LEFT Result Date: 10/23/2023 CLINICAL DATA:  Larey Seat EXAM: DG HIP (WITH OR WITHOUT PELVIS) 2-3V LEFT COMPARISON:  09/29/2021 FINDINGS: No fracture or dislocation. Chronic fragmentation about the left greater trochanter, stable. Extensive iliofemoral arterial calcifications. Lumbar facet DJD L4-S1. Pelvic phleboliths. IMPRESSION: 1. No acute findings. Electronically Signed   By: Corlis Leak M.D.   On: 10/23/2023 15:46   CT Head Wo Contrast Result Date: 10/23/2023 CLINICAL DATA:  Head trauma, minor (Age >= 65y) Fall Thinners; Neck trauma (Age >= 65y) Fall thinners EXAM: CT HEAD WITHOUT CONTRAST CT CERVICAL SPINE WITHOUT CONTRAST  TECHNIQUE: Multidetector CT imaging of the head and cervical spine was performed following the standard protocol without intravenous contrast. Multiplanar CT image reconstructions of the cervical spine were also generated. RADIATION DOSE REDUCTION: This exam was performed according to the departmental dose-optimization program which includes automated exposure control, adjustment of the mA and/or kV according to patient size and/or use of iterative reconstruction technique. COMPARISON:  CT head and CT cervical spine 09/29/2021 FINDINGS: CT HEAD FINDINGS Brain: No evidence of acute infarction, hemorrhage, hydrocephalus, extra-axial collection or mass lesion/mass effect. Vascular: No hyperdense vessel.  Calcific atherosclerosis. Skull: No acute fracture. Sinuses/Orbits: Clear sinuses.  No acute orbital findings. Other: No mastoid effusions. CT CERVICAL SPINE FINDINGS Alignment: No substantial sagittal subluxation. Skull base and vertebrae: No acute fracture. No primary bone lesion or focal pathologic process. Soft tissues and spinal canal: No prevertebral fluid or swelling. No visible canal hematoma. Disc levels:  Mild bony degenerative changes. Upper chest: Visualized lung apices are clear. IMPRESSION: No evidence of acute abnormality intracranially or in the cervical spine. Electronically Signed   By: Feliberto Harts M.D.   On: 10/23/2023 15:23   CT Cervical Spine Wo Contrast Result Date: 10/23/2023 CLINICAL DATA:  Head trauma, minor (Age >= 65y) Fall Thinners; Neck trauma (Age >= 65y) Fall thinners EXAM: CT HEAD WITHOUT CONTRAST CT CERVICAL SPINE WITHOUT CONTRAST TECHNIQUE: Multidetector CT imaging of the head and cervical spine was performed following the standard protocol without intravenous contrast. Multiplanar CT image reconstructions of the cervical spine were also generated. RADIATION DOSE REDUCTION: This exam was performed according to the departmental dose-optimization program which includes automated  exposure control, adjustment of the mA and/or kV according to patient size and/or use of iterative reconstruction technique. COMPARISON:  CT head and CT cervical spine 09/29/2021 FINDINGS: CT HEAD FINDINGS Brain: No evidence of acute infarction, hemorrhage, hydrocephalus, extra-axial collection or mass lesion/mass effect. Vascular: No hyperdense vessel.  Calcific atherosclerosis. Skull:  No acute fracture. Sinuses/Orbits: Clear sinuses.  No acute orbital findings. Other: No mastoid effusions. CT CERVICAL SPINE FINDINGS Alignment: No substantial sagittal subluxation. Skull base and vertebrae: No acute fracture. No primary bone lesion or focal pathologic process. Soft tissues and spinal canal: No prevertebral fluid or swelling. No visible canal hematoma. Disc levels:  Mild bony degenerative changes. Upper chest: Visualized lung apices are clear. IMPRESSION: No evidence of acute abnormality intracranially or in the cervical spine. Electronically Signed   By: Feliberto Harts M.D.   On: 10/23/2023 15:23    Procedures Procedures    Medications Ordered in ED Medications  dextrose 50 % solution 50 mL (50 mLs Intravenous Not Given 10/23/23 1649)    ED Course/ Medical Decision Making/ A&P                                 Medical Decision Making  This patient presents to the ED with chief complaint(s) of conical fall.  The complaint involves an extensive differential diagnosis and also carries with it a high risk of complications and morbidity.   pertinent past medical history as listed in HPI  The differential diagnosis includes  Intracranial hemorrhage, fracture, dislocation, sprain, vascular versus nerve versus tendon injury The initial plan is to  Will obtain imaging head, cervical spine and left hip Additional history obtained: Additional history obtained from family Records reviewed previous admission documents and Care Everywhere/External Records  Initial Assessment:   Patient presents  following mechanical fall.  She is hemodynamically stable, nontoxic-appearing.  She primarily complains of left hip pain movement.  She does have some tenderness on exam along her ischial crest.  No significant pain with leg rolling.  Low suspicion for joint abnormality.  She did tap her head on the wall, no loss of consciousness.  She has been behaving at baseline per family.  She has no neurodeficits on exam.  Regardless we will obtain imaging. It appears in recent days her INR has been elevated, will repeat today.  No reported chest pain, shortness of breath or dizziness prior to fall.  Do not feel that additional workup is necessary.  Independent ECG interpretation:  none  Independent labs interpretation:  The following labs were independently interpreted:  INR discontinued as no evidence of intracranial bleed  Independent visualization and interpretation of imaging: I independently visualized the following imaging with scope of interpretation limited to determining acute life threatening conditions related to emergency care: CT head and neck, left hip x-ray, which revealed no acute findings  Treatment and Reassessment: Offered patient something for pain following first assessment.  She declines  Approximately 4 PM went to discuss negative imaging with patient and family.  Patient reports she is feeling clammy and shaky and weak.  CBG 57. Will give orange juice and crackers with peanut butter.  Upon reevaluation 4:20 PM patient reports improvement of symptoms CBG 113.  She reports she can feels like she can go home to go eat.  She has ambulated here without difficulty.  Will discharge.  Consultations obtained:   none  Disposition:   The patient has been appropriately medically screened and/or stabilized in the ED. I have low suspicion for any other emergent medical condition which would require further screening, evaluation or treatment in the ED or require inpatient management. At time of  discharge the patient is hemodynamically stable and in no acute distress. I have discussed work-up results and diagnosis with  patient and answered all questions. Patient is agreeable with discharge plan. We discussed strict return precautions for returning to the emergency department and they verbalized understanding.     Social Determinants of Health:   none  This note was dictated with voice recognition software.  Despite best efforts at proofreading, errors may have occurred which can change the documentation meaning.          Final Clinical Impression(s) / ED Diagnoses Final diagnoses:  Fall, initial encounter  Hypoglycemia    Rx / DC Orders ED Discharge Orders     None         Fabienne Bruns 10/23/23 1723    Rolan Bucco, MD 10/24/23 [phone]

## 2023-10-23 NOTE — ED Notes (Signed)
Discharge paperwork given and verbally understood. 

## 2023-10-23 NOTE — ED Triage Notes (Signed)
Mechanical fall today at PCP office. Patient was walking into bathroom when she fell to the left. Did hit head. Takes warfarin. Denies LOC. No focal deficits. C/o left hip pain.

## 2023-10-23 NOTE — ED Notes (Signed)
Pt given peanut butter crackers and juice due to CBG... Provider aware.Marland KitchenMarland Kitchen

## 2023-10-26 DIAGNOSIS — F419 Anxiety disorder, unspecified: Secondary | ICD-10-CM | POA: Diagnosis not present

## 2023-10-26 DIAGNOSIS — I350 Nonrheumatic aortic (valve) stenosis: Secondary | ICD-10-CM | POA: Diagnosis not present

## 2023-10-26 DIAGNOSIS — E7800 Pure hypercholesterolemia, unspecified: Secondary | ICD-10-CM | POA: Diagnosis not present

## 2023-10-26 DIAGNOSIS — R296 Repeated falls: Secondary | ICD-10-CM | POA: Diagnosis not present

## 2023-10-26 DIAGNOSIS — M4807 Spinal stenosis, lumbosacral region: Secondary | ICD-10-CM | POA: Diagnosis not present

## 2023-10-26 DIAGNOSIS — N3946 Mixed incontinence: Secondary | ICD-10-CM | POA: Diagnosis not present

## 2023-10-26 DIAGNOSIS — F410 Panic disorder [episodic paroxysmal anxiety] without agoraphobia: Secondary | ICD-10-CM | POA: Diagnosis not present

## 2023-10-26 DIAGNOSIS — I4821 Permanent atrial fibrillation: Secondary | ICD-10-CM | POA: Diagnosis not present

## 2023-10-26 DIAGNOSIS — F5104 Psychophysiologic insomnia: Secondary | ICD-10-CM | POA: Diagnosis not present

## 2023-10-29 ENCOUNTER — Ambulatory Visit (INDEPENDENT_AMBULATORY_CARE_PROVIDER_SITE_OTHER): Payer: Medicare Other | Admitting: "Endocrinology

## 2023-10-29 ENCOUNTER — Encounter: Payer: Self-pay | Admitting: "Endocrinology

## 2023-10-29 ENCOUNTER — Other Ambulatory Visit: Payer: Self-pay

## 2023-10-29 ENCOUNTER — Encounter: Payer: Self-pay | Admitting: Orthopedic Surgery

## 2023-10-29 VITALS — BP 106/72 | HR 88 | Wt 232.0 lb

## 2023-10-29 DIAGNOSIS — E1140 Type 2 diabetes mellitus with diabetic neuropathy, unspecified: Secondary | ICD-10-CM | POA: Diagnosis not present

## 2023-10-29 DIAGNOSIS — E782 Mixed hyperlipidemia: Secondary | ICD-10-CM

## 2023-10-29 DIAGNOSIS — Z7984 Long term (current) use of oral hypoglycemic drugs: Secondary | ICD-10-CM

## 2023-10-29 LAB — POCT GLYCOSYLATED HEMOGLOBIN (HGB A1C): Hemoglobin A1C: 6.9 % — AB (ref 4.0–5.6)

## 2023-10-29 MED ORDER — SEMAGLUTIDE(0.25 OR 0.5MG/DOS) 2 MG/3ML ~~LOC~~ SOPN
0.2500 mg | PEN_INJECTOR | SUBCUTANEOUS | 2 refills | Status: DC
  Filled 2023-10-29: qty 3, 56d supply, fill #0

## 2023-10-29 NOTE — Progress Notes (Signed)
Outpatient Endocrinology Note Debra [place], MD    Debra Wright November 10, 1950 [id number]  Referring Provider: Wilfrid Lund, PA Primary Care Provider: Shon Hale, MD Reason for consultation: Subjective  Type 2 diabetes mellitus  Assessment & Plan  Debra "Diane" was seen today for follow-up.  Diagnoses and all orders for this visit:  Type 2 diabetes mellitus with diabetic neuropathy, without long-term current use of insulin (HCC) -     POCT glycosylated hemoglobin (Hb A1C)  Long term (current) use of oral hypoglycemic drugs  Mixed hypercholesterolemia and hypertriglyceridemia  Other orders -     Semaglutide,0.25 or 0.5MG /DOS, 2 MG/3ML SOPN; Inject 0.25 mg into the skin once a week.   Diabetes complicated by neuropathy Hba1c goal less than 7.5, current Hba1c is 6.9. Will recommend for the following change of medications to: Metformin 1000 mg twice daily Jardiance 25 mg every day Ozempic 0.25 mg/week   Stop Glipizide XL 10 mg every morning (hold if blood sugar is less than 100)  Can't afford Ozempic- liked it but cannot afford No history of MEN syndrome/medullary thyroid cancer/pancreatitis or pancreatic cancer in self or family  Start gabapentin 100 mg qd upto 3 pills PRN neuropathic burning pain  Check BG alternating times of the day and bring log  No known contraindications to any of above medications  Hyperlipidemia -Last LDL near goal: 82 -on rosuvastatin 20 mg QD -Follow low fat diet and exercise   -Blood pressure goal <140/90 - Microalbumin/creatinine at goal < 30 -not on ACE/ARB -diet changes including salt restriction -limit eating outside -counseled BP targets per standards of diabetes care -Uncontrolled blood pressure can lead to retinopathy, nephropathy and cardiovascular and atherosclerotic heart disease  -Na low at 131, new finding  Speak to PCP about hyponatremia, pt is every other day spirolactone 25 mg  Pt understands and  agreed  Reviewed and counseled on: -A1C target -Blood sugar targets -Complications of uncontrolled diabetes  -Checking blood sugar before meals and bedtime and bring log next visit -All medications with mechanism of action and side effects -Hypoglycemia management: rule of 15's, Glucagon Emergency Kit and medical alert ID -low-carb low-fat plate-method diet -At least 20 minutes of physical activity per day -Annual dilated retinal eye exam and foot exam -compliance and follow up needs -follow up as scheduled or earlier if problem gets worse  Call if blood sugar is less than 70 or consistently above 250    Take a 15 gm snack of carbohydrate at bedtime before you go to sleep if your blood sugar is less than 100.    If you are going to fast after midnight for a test or procedure, ask your physician for instructions on how to reduce/decrease your insulin dose.    Call if blood sugar is less than 70 or consistently above 250  -Treating a low sugar by rule of 15  (15 gms of sugar every 15 min until sugar is more than 70) If you feel your sugar is low, test your sugar to be sure If your sugar is low (less than 70), then take 15 grams of a fast acting Carbohydrate (3-4 glucose tablets or glucose gel or 4 ounces of juice or regular soda) Recheck your sugar 15 min after treating low to make sure it is more than 70 If sugar is still less than 70, treat again with 15 grams of carbohydrate          Don't drive the hour of hypoglycemia  If  unconscious/unable to eat or drink by mouth, use glucagon injection or nasal spray baqsimi and call 911. Can repeat again in 15 min if still unconscious.  Return in about 6 weeks (around 12/10/2023).   I spent more than 50% of today's visit counseling patient on symptoms, examination findings, lab findings, imaging results, treatment decisions and monitoring and prognosis. The patient understood the recommendations and agrees with the treatment plan. All  questions regarding treatment plan were fully answered.  Debra [place], MD  10/29/23    History of Present Illness Debra Wright is a 73 y.o. year old female who presents for evaluation of Type 2 diabetes mellitus.  Debra Wright was first diagnosed in 2014.   Diabetes education +  Home diabetes regimen: Metformin 1000 mg twice daily Jardiance 25 mg every day Glipizide XL 10 mg every morning   Non-insulin hypoglycemic drugs previously used: Metformin, glipizide, repaglinide 1 mg since 3/22 (stopped in 05/2022), Januvia  COMPLICATIONS -  MI/Stroke -  retinopathy, last eye exam 2024 +  neuropathy -  nephropathy  BLOOD SUGAR DATA 57-155, once a day, checks different times   Physical Exam  BP 106/72 (BP Location: Left Arm, Patient Position: Sitting, Cuff Size: Large)   Pulse 88   Wt 232 lb (105.2 kg) Comment: patient reported  SpO2 94%   BMI 43.84 kg/m    Constitutional: well developed, well nourished Head: normocephalic, atraumatic Eyes: sclera anicteric, no redness Neck: supple Lungs: normal respiratory effort Neurology: alert and oriented Skin: dry, bruises all over body (on warfarin for A.fib) Musculoskeletal: no appreciable defects Psychiatric: normal mood and affect Diabetic Foot Exam - Simple   No data filed      Current Medications Patient's Medications  New Prescriptions   SEMAGLUTIDE,0.25 OR 0.5MG /DOS, 2 MG/3ML SOPN    Inject 0.25 mg into the skin once a week.  Previous Medications   BLOOD GLUCOSE MONITORING SUPPL (ACCU-CHEK GUIDE ME) W/DEVICE KIT    USE AS DIRECTED.  Dx E11.9.   CALCIUM CARBONATE (TUMS - DOSED IN MG ELEMENTAL CALCIUM) 500 MG CHEWABLE TABLET    Chew 2 tablets by mouth daily as needed for indigestion or heartburn.   CALCIUM CARBONATE-VITAMIN D (CALCIUM 600 + D PO)    Take 1 tablet by mouth daily.   CETIRIZINE (ZYRTEC) 10 MG TABLET    Take 10 mg by mouth daily as needed for allergies.   CINNAMON PO    Take 2 tablets by mouth daily.    CLONAZEPAM (KLONOPIN) 0.5 MG TABLET    TAKE 1/2 TO 1 (ONE-HALF TO ONE) TABLET BY MOUTH AT BEDTIME AS NEEDED   CYANOCOBALAMIN (,VITAMIN B-12,) 1000 MCG/ML INJECTION    Inject 1,000 mcg into the muscle once a week.   CYANOCOBALAMIN (VITAMIN B12) 1000 MCG TBCR    Take 1,000 mcg by mouth daily.   DILTIAZEM (CARDIZEM CD) 180 MG 24 HR CAPSULE    TAKE ONE CAPSULE BY MOUTH ONCE A DAY   ESCITALOPRAM (LEXAPRO) 10 MG TABLET    1/2 tablet once a day x 1 week, then increase to 1 tablet daily   ESZOPICLONE 3 MG TABS    Take 1 tablet (3 mg total) by mouth at bedtime as needed. Take immediately before bedtime   ESZOPICLONE 3 MG TABS    Take 3 mg by mouth at bedtime.   FERROUS SULFATE (IRON) 325 (65 FE) MG TABS    Take 325 mg by mouth 2 (two) times daily.   FLUCONAZOLE (DIFLUCAN) 150 MG  TABLET    Take 1 tablet (150 mg total) by mouth every three (3) days as needed.   GABAPENTIN (NEURONTIN) 100 MG CAPSULE    Take 1 capsule by mouth daily as needed (neuropathic pain)   GLIPIZIDE (GLUCOTROL) 10 MG TABLET    Take 1 tablet (10 mg total) by mouth daily.   GLUCOSE BLOOD (ACCU-CHEK GUIDE) TEST STRIP    CHECK BLOOD GLUCOSE IN THE MORNING AND AT BEDTIME.  E11.9 code.   JARDIANCE 25 MG TABS TABLET    Take 1 tablet by mouth daily before breakfast   MAGNESIUM OXIDE (MAG-OX) 400 (241.3 MG) MG TABLET    Take 400 mg by mouth daily.   METFORMIN (GLUCOPHAGE) 1000 MG TABLET    Take 1 tablet (1,000 mg total) by mouth 2 (two) times daily with a meal.   METOPROLOL TARTRATE (LOPRESSOR) 25 MG TABLET    Take 1 tablet (25 mg total) by mouth 2 (two) times daily.   MYRBETRIQ 25 MG TB24 TABLET    Take 25 mg by mouth daily.   OMEGA-3 FATTY ACIDS (FISH OIL) 1200 MG CAPS    Take 1,200 mg by mouth 2 (two) times daily.   OMEPRAZOLE (PRILOSEC) 40 MG CAPSULE    Take 1 capsule by mouth once daily   OXYBUTYNIN (DITROPAN) 5 MG TABLET    Take 5 mg by mouth 2 (two) times daily.   ROSUVASTATIN (CRESTOR) 20 MG TABLET    Take 1 tablet (20 mg total) by  mouth daily.   SEMAGLUTIDE,0.25 OR 0.5MG /DOS, (OZEMPIC, 0.25 OR 0.5 MG/DOSE,) 2 MG/3ML SOPN    Inject 0.25 mg into the skin once a week.   SPIRONOLACTONE (ALDACTONE) 25 MG TABLET    TAKE ONE-HALF TABLET BY MOUTH ONCE DAILY   TOLTERODINE (DETROL LA) 4 MG 24 HR CAPSULE    Take 4 mg by mouth daily.   TOLTERODINE (DETROL) 2 MG TABLET    Take 2 mg by mouth 2 (two) times daily.   VITAMIN C (ASCORBIC ACID) 250 MG TABLET    Take 250 mg by mouth daily.   WARFARIN (COUMADIN) 5 MG TABLET    TAKE ONE-HALF TO ONE TABLET BY MOUTH DAILY AS DIRECTED BY COUMADIN CLINIC  Modified Medications   No medications on file  Discontinued Medications   No medications on file    Allergies Allergies  Allergen Reactions   Antivert [Meclizine Hcl] Other (See Comments)    "makes me feel like my skin is falling off".  03/05/20 pt states she was placed on this a few months back and did not have a reaction   Meclizine Other (See Comments)    "makes me feel like my skin is falling off".  03/05/20 pt states she was placed on this a few months back and did not have a reaction Other reaction(s): jumping out of skin, jittery   Trazodone Hcl Other (See Comments)    Other reaction(s): nightmares   Zolpidem Other (See Comments)    Reverse affect   Zolpidem Tartrate     Other reaction(s): doing things in sleep unaware    Past Medical History Past Medical History:  Diagnosis Date   Anxiety    Arthritis    "knees; right thumb" (10/14/2014)   Basal cell carcinoma    "burned off upper lip & right shoulder"   Chronic anticoagulation    CHADS VASC=3   Complication of anesthesia    after spinal anesthesia for left knee replacement, bladder did "not wake up". Was incontinent  for 4 days post surgery   Depression    Fibroid tumor    Frequent diarrhea    GERD (gastroesophageal reflux disease)    High cholesterol    Hypertension    EF 65-70% grade 2DD   Insomnia    Migraine    "frequently when I was younger; maybe monthly  now" (10/14/2014) - none after hysterectomy   PAF (paroxysmal atrial fibrillation) (HCC) 05/02/2018   converted with rate control   Pernicious anemia    pt not aware of this   Sleep apnea    uses a cpap   Type II diabetes mellitus (HCC)    Vitamin B 12 deficiency     Past Surgical History Past Surgical History:  Procedure Laterality Date   ABDOMINAL HYSTERECTOMY  1988   ACHILLES TENDON SURGERY Right    APPENDECTOMY  1988   BILATERAL OOPHORECTOMY  2004   CARPAL TUNNEL RELEASE Bilateral 1986   CATARACT EXTRACTION W/ INTRAOCULAR LENS  IMPLANT, BILATERAL Bilateral 2011   COLONOSCOPY     JOINT REPLACEMENT     SHOULDER ARTHROSCOPY DISTAL CLAVICLE EXCISION AND OPEN ROTATOR CUFF REPAIR Right 11/2013   TOENAIL EXCISION Right 04/2018   "big toe"   TONSILLECTOMY  1950's   TOTAL KNEE ARTHROPLASTY Left 10/13/2014   Procedure: LEFT TOTAL KNEE ARTHROPLASTY;  Surgeon: Kathryne Hitch, MD;  Location: [facility] OR;  Service: Orthopedics;  Laterality: Left;   TOTAL KNEE ARTHROPLASTY Right 12/10/2018   Procedure: RIGHT TOTAL KNEE ARTHROPLASTY;  Surgeon: Kathryne Hitch, MD;  Location: [facility] OR;  Service: Orthopedics;  Laterality: Right;    Family History family history includes Arthritis in her father, maternal grandfather, maternal grandmother, mother, paternal grandfather, and paternal grandmother; Diabetes in her brother, father, maternal grandmother, mother, paternal grandfather, and paternal grandmother; Heart disease in her father; Hyperlipidemia in her father, maternal grandfather, maternal grandmother, mother, paternal grandfather, and paternal grandmother; Hypertension in her brother, maternal grandfather, maternal grandmother, mother, paternal grandfather, and paternal grandmother; Stroke in her brother.  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   Married       Right handed    Drinks caffeine one cup of coffee daily   An apartment    Social Drivers of Corporate investment banker Strain: Not on file  Food Insecurity: Not on file  Transportation Needs: Not on file  Physical Activity: Not on file  Stress: Not on file  Social Connections: Not on file  Intimate Partner Violence: Not on file    Lab Results  Component Value Date   HGBA1C 6.9 (A) 10/29/2023   Lab Results  Component Value Date   CHOL 122 05/07/2023   Lab Results  Component Value Date   HDL 37.20 (L) 05/07/2023   Lab Results  Component Value Date   LDLCALC 66 08/07/2018   Lab Results  Component Value Date   TRIG 245.0 (H) 05/07/2023   Lab Results  Component Value Date   CHOLHDL 3 05/07/2023   Lab Results  Component Value Date   CREATININE 0.72 05/07/2023   Lab Results  Component Value Date   GFR 82.37 05/07/2023   Lab Results  Component Value Date   MICROALBUR 1.2 05/07/2023      Component Value Date/Time   NA 137 05/07/2023 1432   NA 141 01/15/2020 1417   K 4.1 05/07/2023 1432   CL 104 05/07/2023 1432   CO2 23 05/07/2023 1432   GLUCOSE 152 (H) 05/07/2023 1432   BUN 15 05/07/2023 1432   BUN 8 01/15/2020 1417   CREATININE 0.72 05/07/2023 1432   CREATININE 0.59 (L) 11/26/2019 1319   CALCIUM 8.7 05/07/2023 1432   PROT 6.1 05/07/2023 1432   ALBUMIN 3.6 05/07/2023 1432   AST 22 05/07/2023 1432   ALT 22 05/07/2023 1432   ALKPHOS 62 05/07/2023 1432   BILITOT 0.3 05/07/2023 1432   GFRNONAA >60  07/12/2022 1906   GFRAA 110 01/15/2020 1417      Latest Ref Rng & Units 05/07/2023    2:32 PM 02/01/2023    2:23 PM 11/15/2022    1:53 PM  BMP  Glucose 70 - 99 mg/dL 161  096  045   BUN 6 - 23 mg/dL 15  14  14    Creatinine 0.40 - 1.20 mg/dL 4.09  8.11  9.14   Sodium 135 - 145 mEq/L 137  140  136   Potassium 3.5 - 5.1 mEq/L 4.1  4.1  4.3   Chloride 96 - 112 mEq/L 104  102  102   CO2 19 - 32 mEq/L 23  28  30    Calcium 8.4 - 10.5 mg/dL 8.7  9.2  9.2        Component Value Date/Time   WBC 8.8 07/12/2022 1803   RBC 4.92 07/12/2022 1803   HGB 14.2 07/12/2022 1803   HCT 44.6 07/12/2022 1803   PLT 264 07/12/2022 1803   MCV 90.7 07/12/2022 1803   MCH 28.9 07/12/2022 1803   MCHC 31.8 07/12/2022 1803   RDW 14.4 07/12/2022 1803   LYMPHSABS 2.7 07/12/2022 1803   MONOABS 0.7 07/12/2022 1803   EOSABS 0.1 07/12/2022 1803   BASOSABS 0.0 07/12/2022 1803     Parts of this note may have been dictated using voice recognition software. There may be variances in spelling and vocabulary which are unintentional. Not all errors are proofread. Please notify the [facility] Parkin if any discrepancies are noted or if the meaning of any statement is not clear.

## 2023-10-29 NOTE — Patient Instructions (Signed)

## 2023-10-30 ENCOUNTER — Ambulatory Visit: Payer: Medicare Other | Attending: Physician Assistant | Admitting: Physician Assistant

## 2023-10-30 ENCOUNTER — Ambulatory Visit (INDEPENDENT_AMBULATORY_CARE_PROVIDER_SITE_OTHER): Payer: Self-pay | Admitting: Cardiology

## 2023-10-30 DIAGNOSIS — I480 Paroxysmal atrial fibrillation: Secondary | ICD-10-CM | POA: Diagnosis not present

## 2023-10-30 DIAGNOSIS — Z7901 Long term (current) use of anticoagulants: Secondary | ICD-10-CM | POA: Diagnosis not present

## 2023-10-30 DIAGNOSIS — Z01810 Encounter for preprocedural cardiovascular examination: Secondary | ICD-10-CM

## 2023-10-30 LAB — POCT INR: INR: 1.2 — AB (ref 2.0–3.0)

## 2023-10-30 NOTE — Telephone Encounter (Signed)
Notes faxed to surgeon. This phone note will be removed from the preop pool. Tereso Newcomer, PA-C  10/30/2023 1:54 PM

## 2023-10-30 NOTE — Progress Notes (Signed)
Virtual Visit via Telephone Note   Because of Debra Wright's co-morbid illnesses, she is at least at moderate risk for complications without adequate follow up.  This format is felt to be most appropriate for this patient at this time.  The patient did not have access to video technology/had technical difficulties with video requiring transitioning to audio format only (telephone).  All issues noted in this document were discussed and addressed.  No physical exam could be performed with this format.  Please refer to the patient's chart for her consent to telehealth for [facility].  Evaluation Performed:  Preoperative cardiovascular risk assessment _____________   Date:  10/30/2023   Patient ID:  Debra Wright, DOB Jan 13, 1951, MRN [id number] Patient Location:  Home - [address] [place] Provider location:   Office  Primary Care Provider:  Shon Hale, MD Primary Cardiologist:  Nicki [place], MD  Chief Complaint / Patient Profile   73 y.o. y/o female with a h/o   Persistent atrial fibrillation  Mild aortic stenosis  TTE 12/08/2022: EF 60-65, no RWMA, moderate LVH, trivial MR, mild aortic stenosis, ascending aorta 41 mm, mean aortic valve gradient 7.4 mmHg, AV V-max 179 cm/s, DI 0.53 Dilated ascending aorta  Myoview 05/16/2018: No ischemia or infarction, EF 67 Hypertension  Diabetes mellitus  Hyperlipidemia  OSA  Obesity   She is pending right open carpal tunnel release with Dr. Denese Killings under general anesthesia and presents today for telephonic preoperative cardiovascular risk assessment.  Her history was reviewed by our Pharm.D. to help make recommendations regarding holding anticoagulation. Per office protocol, patient can hold warfarin for 5 days prior to procedure. Patient will NOT need bridging with Lovenox (enoxaparin) around procedure.    History of Present Illness    Debra Wright is a 73 y.o. female who presents via audio/video conferencing for a  telehealth visit today.  Pt was last seen in cardiology clinic on 03/23/2023 by Dr. Tresa Endo.  At that time Debra Wright was doing well.  The patient is now pending procedure as outlined above. Since her last visit, she has done well without chest pain or shortness of breath.  She is currently holding her Coumadin for an upcoming back injection.    Physical Exam    Vital Signs:  Debra Wright does not have vital signs available for review today.  Given telephonic nature of communication, physical exam is limited. AAOx3. NAD. Normal affect.  Speech and respirations are unlabored.  Accessory Clinical Findings    None    Assessment & Plan    Assessment & Plan Preoperative cardiovascular examination Debra Wright's perioperative risk of a major cardiac event is 0.4% according to the Revised Cardiac Risk Index (RCRI).  Therefore, she is at low risk for perioperative complications.   Her functional capacity is fair at 4.73 METs according to the Duke Activity Status Index (DASI). Recommendations: According to ACC/AHA guidelines, no further cardiovascular testing needed.  The patient may proceed to surgery at acceptable risk.   Antiplatelet and/or Anticoagulation Recommendations: Coumadin can by held for 5 days prior to surgery.  Please resume post op when felt to be safe.   The patient was advised that if she develops new symptoms prior to surgery to contact our office to arrange for a follow-up visit, and she verbalized understanding.  A copy of this note will be routed to requesting surgeon.  Time:   Today, I have spent 6 minutes with the patient with telehealth technology discussing medical  history, symptoms, and management plan.     Tereso Newcomer, PA-C  10/30/2023, 1:52 PM

## 2023-10-31 DIAGNOSIS — M5416 Radiculopathy, lumbar region: Secondary | ICD-10-CM | POA: Diagnosis not present

## 2023-11-02 ENCOUNTER — Telehealth: Payer: Self-pay | Admitting: *Deleted

## 2023-11-02 ENCOUNTER — Other Ambulatory Visit: Payer: Self-pay

## 2023-11-02 ENCOUNTER — Encounter (HOSPITAL_BASED_OUTPATIENT_CLINIC_OR_DEPARTMENT_OTHER): Payer: Self-pay | Admitting: Orthopedic Surgery

## 2023-11-02 NOTE — Telephone Encounter (Addendum)
Pt called inquiring about upcoming rt hand carpal tunnel surgery which is scheduled for 11/09/23. She has clearance form to hold warfarin 5 days with bridge on 10/15/23 and per Telephone visit on 10/30/23 with Lorin Picket she can hold warfarin as well with no bridge.   Pt's last dose will be on 11/03/23 and she will hold 2/2, 2/3, 2/4, 2/5, 2/6 and after procedure on 2/7 if okay to resume warfarin she will take an extra 1/2 tablet (5mg ) for 2 days 2/7 & 2/8 then resume normal dose of warfarin (1/2 tablet-2.5mg  daily except 1 tablet-5mg ) on Mondays, Tuesday, and Thursdays) and recheck INR on Thursday 11/15/23.    Spoke with pt at 1242pm and advised of the above and she verbalized understanding. She was able to write down instructions and advised to call back if needed.

## 2023-11-02 NOTE — Telephone Encounter (Signed)
Pt called and stated that her insurance has blocked the procedure due to a missed payment that her/husband did not know anything about. She states they will have to reschedule her carpal tunnel surgery.   Advised pt that she should continue warfarin dose and take her 2.5mg  today, 2.5mg  Saturday, and 2.5mg  Sunday and recheck INR on Monday 11/05/23. She will update [place] once the procedure has been rescheduled and call back with any updates/questions.

## 2023-11-02 NOTE — Telephone Encounter (Signed)
Pt called back and stated they have worked everything out and her Carpal Tunnel Surgery on 11/09/23. Gave her instructions again:  Last warfarin dose will be on 11/03/23 and she will hold 2/2, 2/3, 2/4, 2/5, 2/6 and after procedure on 2/7 if okay to resume warfarin she will take an extra 1/2 tablet (5mg ) for 2 days 2/7 & 2/8 then resume normal dose of warfarin (1/2 tablet-2.5mg  daily except 1 tablet-5mg ) on Mondays, Tuesday, and Thursdays) and recheck INR on Thursday 11/15/23.  She verbalized understanding and will call back if needed.

## 2023-11-07 ENCOUNTER — Other Ambulatory Visit: Payer: Self-pay

## 2023-11-08 ENCOUNTER — Encounter (HOSPITAL_BASED_OUTPATIENT_CLINIC_OR_DEPARTMENT_OTHER)
Admission: RE | Admit: 2023-11-08 | Discharge: 2023-11-08 | Disposition: A | Discharge status: Home / Self Care | Payer: Medicare Other | Source: Ambulatory Visit | Attending: Orthopedic Surgery | Admitting: Orthopedic Surgery

## 2023-11-08 DIAGNOSIS — G5601 Carpal tunnel syndrome, right upper limb: Secondary | ICD-10-CM | POA: Diagnosis not present

## 2023-11-08 DIAGNOSIS — Z7901 Long term (current) use of anticoagulants: Secondary | ICD-10-CM | POA: Diagnosis not present

## 2023-11-08 DIAGNOSIS — Z6841 Body Mass Index (BMI) 40.0 and over, adult: Secondary | ICD-10-CM | POA: Diagnosis not present

## 2023-11-08 DIAGNOSIS — I10 Essential (primary) hypertension: Secondary | ICD-10-CM | POA: Diagnosis not present

## 2023-11-08 DIAGNOSIS — F32A Depression, unspecified: Secondary | ICD-10-CM | POA: Diagnosis not present

## 2023-11-08 DIAGNOSIS — D759 Disease of blood and blood-forming organs, unspecified: Secondary | ICD-10-CM | POA: Diagnosis not present

## 2023-11-08 DIAGNOSIS — F419 Anxiety disorder, unspecified: Secondary | ICD-10-CM | POA: Diagnosis not present

## 2023-11-08 DIAGNOSIS — Z7984 Long term (current) use of oral hypoglycemic drugs: Secondary | ICD-10-CM | POA: Diagnosis not present

## 2023-11-08 DIAGNOSIS — E119 Type 2 diabetes mellitus without complications: Secondary | ICD-10-CM | POA: Diagnosis not present

## 2023-11-08 DIAGNOSIS — I480 Paroxysmal atrial fibrillation: Secondary | ICD-10-CM | POA: Diagnosis not present

## 2023-11-08 DIAGNOSIS — D649 Anemia, unspecified: Secondary | ICD-10-CM | POA: Diagnosis not present

## 2023-11-08 DIAGNOSIS — E66813 Obesity, class 3: Secondary | ICD-10-CM | POA: Diagnosis not present

## 2023-11-08 DIAGNOSIS — I350 Nonrheumatic aortic (valve) stenosis: Secondary | ICD-10-CM | POA: Diagnosis not present

## 2023-11-08 DIAGNOSIS — G4730 Sleep apnea, unspecified: Secondary | ICD-10-CM | POA: Diagnosis not present

## 2023-11-08 LAB — BASIC METABOLIC PANEL
Anion gap: 11 (ref 5–15)
BUN: 18 mg/dL (ref 8–23)
CO2: 22 mmol/L (ref 22–32)
Calcium: 8.9 mg/dL (ref 8.9–10.3)
Chloride: 100 mmol/L (ref 98–111)
Creatinine, Ser: 0.81 mg/dL (ref 0.44–1.00)
GFR, Estimated: >60 mL/min (ref >60)
Glucose, Bld: 280 mg/dL — ABNORMAL HIGH (ref 70–99)
Potassium: 4.9 mmol/L (ref 3.5–5.1)
Sodium: 133 mmol/L — ABNORMAL LOW (ref 135–145)

## 2023-11-08 NOTE — Anesthesia Preprocedure Evaluation (Addendum)
 Anesthesia Evaluation  Patient identified by MRN, date of birth, ID band Patient awake    Reviewed: Allergy & Precautions, NPO status , Patient's Chart, lab work & pertinent test results, reviewed documented beta blocker date and time   History of Anesthesia Complications (+) history of anesthetic complications  Airway Mallampati: IV  TM Distance: >3 FB Neck ROM: Limited    Dental  (+) Dental Advisory Given, Teeth Intact   Pulmonary sleep apnea and Continuous Positive Airway Pressure Ventilation    breath sounds clear to auscultation + decreased breath sounds      Cardiovascular hypertension, Pt. on home beta blockers and Pt. on medications + dysrhythmias Atrial Fibrillation + Valvular Problems/Murmurs AS  Rhythm:Irregular Rate:Normal + Systolic murmurs Echo  1. Left ventricular ejection fraction, by estimation, is 60 to 65%. The left ventricle has normal function. The left ventricle has no regional wall motion abnormalities. There is moderate left ventricular hypertrophy. Left ventricular diastolic parameters are indeterminate.   2. Right ventricular systolic function is mildly reduced. The right ventricular size is mildly enlarged. Tricuspid regurgitation signal is inadequate for assessing PA pressure.   3. The mitral valve is degenerative. Trivial mitral valve regurgitation. No evidence of mitral stenosis. Moderate mitral annular calcification.   4. The aortic valve is calcified. There is moderate calcification of the aortic valve. Aortic valve regurgitation is trivial. Mild aortic valve stenosis.   5. Aortic dilatation noted. There is dilatation of the ascending aorta, measuring 41 mm.   6. The inferior vena cava is dilated in size with >50% respiratory variability, suggesting right atrial pressure of 8 mmHg.   7. Evidence of atrial level shunting detected by color flow Doppler. Appears to be small patent foramen ovale.      Neuro/Psych  Headaches PSYCHIATRIC DISORDERS Anxiety Depression       GI/Hepatic ,GERD  Controlled and Medicated,,  Endo/Other  diabetes, Type 2, Oral Hypoglycemic Agents  Class 3 obesity  Renal/GU      Musculoskeletal  (+) Arthritis ,    Abdominal  (+) + obese  Peds  Hematology  (+) Blood dyscrasia (Coumadin ), anemia Plt 368k   Anesthesia Other Findings   Reproductive/Obstetrics                             Anesthesia Physical Anesthesia Plan  ASA: 3  Anesthesia Plan: General   Post-op Pain Management: Tylenol  PO (pre-op)* and Gabapentin  PO (pre-op)*   Induction: Intravenous  PONV Risk Score and Plan: 3 and Ondansetron , Dexamethasone  and Treatment may vary due to age or medical condition  Airway Management Planned: LMA  Additional Equipment:   Intra-op Plan:   Post-operative Plan: Extubation in OR  Informed Consent: I have reviewed the patients History and Physical, chart, labs and discussed the procedure including the risks, benefits and alternatives for the proposed anesthesia with the patient or authorized representative who has indicated his/her understanding and acceptance.     Dental advisory given  Plan Discussed with: CRNA  Anesthesia Plan Comments: (Risks of anesthesia explained at length. This includes, but is not limited to, sore throat, damage to teeth, lips gums, tongue and vocal cords, nausea and vomiting, reactions to medications, stroke, heart attack, and death. All patient questions were answered and the patient wishes to proceed. )        Anesthesia Quick Evaluation

## 2023-11-09 ENCOUNTER — Ambulatory Visit (HOSPITAL_BASED_OUTPATIENT_CLINIC_OR_DEPARTMENT_OTHER): Payer: Medicare Other | Admitting: Anesthesiology

## 2023-11-09 ENCOUNTER — Encounter (HOSPITAL_BASED_OUTPATIENT_CLINIC_OR_DEPARTMENT_OTHER)
Admission: RE | Disposition: A | Discharge status: Home / Self Care | Payer: Self-pay | Source: Ambulatory Visit | Attending: Orthopedic Surgery

## 2023-11-09 ENCOUNTER — Other Ambulatory Visit: Payer: Self-pay

## 2023-11-09 ENCOUNTER — Encounter (HOSPITAL_BASED_OUTPATIENT_CLINIC_OR_DEPARTMENT_OTHER): Payer: Self-pay | Admitting: Orthopedic Surgery

## 2023-11-09 ENCOUNTER — Ambulatory Visit (HOSPITAL_BASED_OUTPATIENT_CLINIC_OR_DEPARTMENT_OTHER)
Admission: RE | Admit: 2023-11-09 | Discharge: 2023-11-09 | Disposition: A | Discharge status: Home / Self Care | Payer: Medicare Other | Source: Ambulatory Visit | Attending: Orthopedic Surgery | Admitting: Orthopedic Surgery

## 2023-11-09 DIAGNOSIS — I350 Nonrheumatic aortic (valve) stenosis: Secondary | ICD-10-CM | POA: Insufficient documentation

## 2023-11-09 DIAGNOSIS — G5601 Carpal tunnel syndrome, right upper limb: Secondary | ICD-10-CM

## 2023-11-09 DIAGNOSIS — G4733 Obstructive sleep apnea (adult) (pediatric): Secondary | ICD-10-CM | POA: Diagnosis not present

## 2023-11-09 DIAGNOSIS — F32A Depression, unspecified: Secondary | ICD-10-CM | POA: Insufficient documentation

## 2023-11-09 DIAGNOSIS — Z7901 Long term (current) use of anticoagulants: Secondary | ICD-10-CM | POA: Insufficient documentation

## 2023-11-09 DIAGNOSIS — Z6841 Body Mass Index (BMI) 40.0 and over, adult: Secondary | ICD-10-CM | POA: Insufficient documentation

## 2023-11-09 DIAGNOSIS — E119 Type 2 diabetes mellitus without complications: Secondary | ICD-10-CM | POA: Insufficient documentation

## 2023-11-09 DIAGNOSIS — Z7984 Long term (current) use of oral hypoglycemic drugs: Secondary | ICD-10-CM | POA: Insufficient documentation

## 2023-11-09 DIAGNOSIS — G4730 Sleep apnea, unspecified: Secondary | ICD-10-CM | POA: Diagnosis not present

## 2023-11-09 DIAGNOSIS — F419 Anxiety disorder, unspecified: Secondary | ICD-10-CM | POA: Insufficient documentation

## 2023-11-09 DIAGNOSIS — I4891 Unspecified atrial fibrillation: Secondary | ICD-10-CM | POA: Diagnosis not present

## 2023-11-09 DIAGNOSIS — I10 Essential (primary) hypertension: Secondary | ICD-10-CM | POA: Insufficient documentation

## 2023-11-09 DIAGNOSIS — E66813 Obesity, class 3: Secondary | ICD-10-CM | POA: Insufficient documentation

## 2023-11-09 DIAGNOSIS — D649 Anemia, unspecified: Secondary | ICD-10-CM | POA: Insufficient documentation

## 2023-11-09 DIAGNOSIS — I480 Paroxysmal atrial fibrillation: Secondary | ICD-10-CM | POA: Insufficient documentation

## 2023-11-09 DIAGNOSIS — D759 Disease of blood and blood-forming organs, unspecified: Secondary | ICD-10-CM | POA: Insufficient documentation

## 2023-11-09 HISTORY — PX: HYPOTHENAR FAT PAD TRANSFER: SHX6408

## 2023-11-09 HISTORY — PX: CARPAL TUNNEL RELEASE: SHX101

## 2023-11-09 LAB — PROTIME-INR
INR: 1.1 (ref 0.8–1.2)
Prothrombin Time: 14.4 s (ref 11.4–15.2)

## 2023-11-09 LAB — GLUCOSE, CAPILLARY
Glucose-Capillary: 144 mg/dL — ABNORMAL HIGH (ref 70–99)
Glucose-Capillary: 137 mg/dL — ABNORMAL HIGH (ref 70–99)

## 2023-11-09 SURGERY — CARPAL TUNNEL RELEASE
Anesthesia: General | Site: Wrist | Laterality: Right

## 2023-11-09 MED ORDER — SODIUM CHLORIDE 0.9 % IV SOLN: INTRAVENOUS | Status: DC | PRN

## 2023-11-09 MED ORDER — PHENYLEPHRINE 80 MCG/ML (10ML) SYRINGE FOR IV PUSH (FOR BLOOD PRESSURE SUPPORT)
PREFILLED_SYRINGE | INTRAVENOUS | Status: DC | PRN
  Administered 2023-11-09 (×6): 80 ug via INTRAVENOUS

## 2023-11-09 MED ORDER — OXYCODONE HCL 5 MG PO TABS: 5.0000 mg | ORAL_TABLET | Freq: Once | ORAL | Status: DC | PRN

## 2023-11-09 MED ORDER — FENTANYL CITRATE (PF) 100 MCG/2ML IJ SOLN
INTRAMUSCULAR | Status: AC
  Filled 2023-11-09: qty 2

## 2023-11-09 MED ORDER — ONDANSETRON HCL 4 MG/2ML IJ SOLN
INTRAMUSCULAR | Status: DC | PRN
  Administered 2023-11-09: 4 mg via INTRAVENOUS

## 2023-11-09 MED ORDER — LIDOCAINE 2% (20 MG/ML) 5 ML SYRINGE
INTRAMUSCULAR | Status: DC | PRN
  Administered 2023-11-09: 100 mg via INTRAVENOUS

## 2023-11-09 MED ORDER — PROPOFOL 10 MG/ML IV BOLUS
INTRAVENOUS | Status: DC | PRN
  Administered 2023-11-09: 120 mg via INTRAVENOUS

## 2023-11-09 MED ORDER — ROPIVACAINE HCL 5 MG/ML IJ SOLN
INTRAMUSCULAR | Status: AC
  Filled 2023-11-09: qty 30

## 2023-11-09 MED ORDER — DEXAMETHASONE SODIUM PHOSPHATE 10 MG/ML IJ SOLN
INTRAMUSCULAR | Status: AC
  Filled 2023-11-09: qty 1

## 2023-11-09 MED ORDER — 0.9 % SODIUM CHLORIDE (POUR BTL) OPTIME
TOPICAL | Status: DC | PRN
  Administered 2023-11-09: 1000 mL

## 2023-11-09 MED ORDER — FENTANYL CITRATE (PF) 100 MCG/2ML IJ SOLN
25.0000 ug | INTRAMUSCULAR | Status: DC | PRN
  Administered 2023-11-09: 50 ug via INTRAVENOUS

## 2023-11-09 MED ORDER — LIDOCAINE 2% (20 MG/ML) 5 ML SYRINGE
INTRAMUSCULAR | Status: AC
  Filled 2023-11-09: qty 5

## 2023-11-09 MED ORDER — CEFAZOLIN SODIUM-DEXTROSE 2-4 GM/100ML-% IV SOLN
2.0000 g | INTRAVENOUS | Status: AC
  Administered 2023-11-09: 2 g via INTRAVENOUS

## 2023-11-09 MED ORDER — LACTATED RINGERS IV SOLN: INTRAVENOUS | Status: DC

## 2023-11-09 MED ORDER — DROPERIDOL 2.5 MG/ML IJ SOLN: 0.6250 mg | Freq: Once | INTRAMUSCULAR | Status: DC | PRN

## 2023-11-09 MED ORDER — ONDANSETRON HCL 4 MG/2ML IJ SOLN
INTRAMUSCULAR | Status: AC
  Filled 2023-11-09: qty 2

## 2023-11-09 MED ORDER — ROPIVACAINE HCL 5 MG/ML IJ SOLN
INTRAMUSCULAR | Status: DC | PRN
  Administered 2023-11-09: 10 mL

## 2023-11-09 MED ORDER — ACETAMINOPHEN 500 MG PO TABS
1000.0000 mg | ORAL_TABLET | Freq: Four times a day (QID) | ORAL | Status: DC | PRN
  Administered 2023-11-09: 1000 mg via ORAL

## 2023-11-09 MED ORDER — CEFAZOLIN SODIUM-DEXTROSE 2-4 GM/100ML-% IV SOLN
INTRAVENOUS | Status: AC
  Filled 2023-11-09: qty 100

## 2023-11-09 MED ORDER — DEXAMETHASONE SODIUM PHOSPHATE 10 MG/ML IJ SOLN
INTRAMUSCULAR | Status: DC | PRN
  Administered 2023-11-09: 8 mg via INTRAVENOUS

## 2023-11-09 MED ORDER — OXYCODONE HCL 5 MG/5ML PO SOLN: 5.0000 mg | Freq: Once | ORAL | Status: DC | PRN

## 2023-11-09 MED ORDER — PROPOFOL 10 MG/ML IV BOLUS
INTRAVENOUS | Status: AC
  Filled 2023-11-09: qty 20

## 2023-11-09 MED ORDER — FENTANYL CITRATE (PF) 100 MCG/2ML IJ SOLN
INTRAMUSCULAR | Status: DC | PRN
  Administered 2023-11-09: 25 ug via INTRAVENOUS

## 2023-11-09 MED ORDER — ACETAMINOPHEN 500 MG PO TABS
ORAL_TABLET | ORAL | Status: AC
  Filled 2023-11-09: qty 1

## 2023-11-09 MED ORDER — OXYCODONE HCL 5 MG PO TABS: 5.0000 mg | ORAL_TABLET | Freq: Four times a day (QID) | ORAL | 0 refills | Status: DC | PRN

## 2023-11-09 SURGICAL SUPPLY — 38 items
BLADE MINI RND TIP GREEN BEAV (BLADE) ×1 IMPLANT
BLADE SURG 15 STRL LF DISP TIS (BLADE) ×2 IMPLANT
BNDG COHESIVE 4X5 TAN STRL LF (GAUZE/BANDAGES/DRESSINGS) ×1 IMPLANT
BNDG ELASTIC 4INX 5YD STR LF (GAUZE/BANDAGES/DRESSINGS) ×1 IMPLANT
BNDG ESMARK 4X9 LF (GAUZE/BANDAGES/DRESSINGS) ×1 IMPLANT
BNDG GAUZE DERMACEA FLUFF 4 (GAUZE/BANDAGES/DRESSINGS) ×1 IMPLANT
CHLORAPREP W/TINT 26 (MISCELLANEOUS) ×1 IMPLANT
CORD BIPOLAR FORCEPS 12FT (ELECTRODE) ×1 IMPLANT
COVER BACK TABLE 60X90IN (DRAPES) ×1 IMPLANT
CUFF TOURN SGL QUICK 18X4 (TOURNIQUET CUFF) IMPLANT
DRAPE HAND 75INX146IN 110IN (DRAPES) ×1 IMPLANT
DRAPE SURG 17X23 STRL (DRAPES) ×1 IMPLANT
GAUZE PAD ABD 8X10 STRL (GAUZE/BANDAGES/DRESSINGS) ×1 IMPLANT
GAUZE SPONGE 4X4 12PLY STRL (GAUZE/BANDAGES/DRESSINGS) ×1 IMPLANT
GAUZE STRETCH 2X75IN STRL (MISCELLANEOUS) ×1 IMPLANT
GAUZE XEROFORM 1X8 LF (GAUZE/BANDAGES/DRESSINGS) ×1 IMPLANT
GLOVE BIO SURGEON STRL SZ7.5 (GLOVE) ×1 IMPLANT
GLOVE BIOGEL PI IND STRL 7.5 (GLOVE) ×1 IMPLANT
GOWN STRL REUS W/ TWL LRG LVL3 (GOWN DISPOSABLE) ×2 IMPLANT
GOWN STRL REUS W/TWL XL LVL3 (GOWN DISPOSABLE) IMPLANT
GOWN STRL SURGICAL XL XLNG (GOWN DISPOSABLE) ×1 IMPLANT
NDL HYPO 25X5/8 SAFETYGLIDE (NEEDLE) IMPLANT
NEEDLE HYPO 25X5/8 SAFETYGLIDE (NEEDLE) ×1
NS IRRIG 1000ML POUR BTL (IV SOLUTION) IMPLANT
PACK BASIN DAY SURGERY FS (CUSTOM PROCEDURE TRAY) ×1 IMPLANT
PAD CAST 4YDX4 CTTN HI CHSV (CAST SUPPLIES) IMPLANT
SHEET MEDIUM DRAPE 40X70 STRL (DRAPES) ×1 IMPLANT
SPIKE FLUID TRANSFER (MISCELLANEOUS) IMPLANT
SPLINT PLASTER CAST XFAST 4X15 (CAST SUPPLIES) IMPLANT
STOCKINETTE IMPERVIOUS 9X36 MD (GAUZE/BANDAGES/DRESSINGS) ×1 IMPLANT
SUCTION TUBE FRAZIER 10FR DISP (SUCTIONS) IMPLANT
SUT ETHILON 4 0 PS 2 18 (SUTURE) ×1 IMPLANT
SUT VIC AB 3-0 SH 27X BRD (SUTURE) IMPLANT
SYR BULB EAR ULCER 3OZ GRN STR (SYRINGE) ×2 IMPLANT
SYR CONTROL 10ML LL (SYRINGE) ×1 IMPLANT
TOWEL GREEN STERILE FF (TOWEL DISPOSABLE) ×2 IMPLANT
TUBE CONNECTING 20X1/4 (TUBING) IMPLANT
UNDERPAD 30X36 HEAVY ABSORB (UNDERPADS AND DIAPERS) ×1 IMPLANT

## 2023-11-09 NOTE — Op Note (Signed)
 NAME: Debra Wright MEDICAL RECORD NO: [id number] DATE OF BIRTH: 11/03/1950 FACILITY: [facility] LOCATION: [facility] PHYSICIAN: GILDARDO ALDERTON, MD   OPERATIVE REPORT   DATE OF PROCEDURE: 11/09/23    PREOPERATIVE DIAGNOSIS: Right recurrent carpal tunnel syndrome  POSTOPERATIVE DIAGNOSIS: Right recurrent carpal tunnel syndrome   PROCEDURE: Right revision open carpal tunnel release with hypothenar fat flap   SURGEON:  Gildardo Alderton, M.D.   ASSISTANT: Joesph Hooks, OPA   ANESTHESIA:  General   INTRAVENOUS FLUIDS:  Per anesthesia flow sheet.   ESTIMATED BLOOD LOSS:  Minimal.   COMPLICATIONS:  None.   SPECIMENS:  none   TOURNIQUET TIME:    Total Tourniquet Time Documented: Upper Arm (Right) - 27 minutes Total: Upper Arm (Right) - 27 minutes    DISPOSITION:  Stable to PACU.   INDICATIONS: This is a 73 year old female with clinical and electrodiagnostic evidence of right-sided recurrent carpal tunnel syndrome.  Index procedure was performed over 20 years prior at an outside facility. Given that her surgery on the right side was performed over 20 years prior, we did discuss that recurrence of carpal tunnel syndrome can occur in the circumstances. We discussed risks and benefits of revision carpal tunnel surgery as well as the likely need for a hypothenar fat pad flap in order to prevent recurrent scarring around the nerve. Risks and benefits of surgery were discussed including the risks of infection, bleeding, scarring, stiffness, nerve injury, vascular injury, tendon injury, need for subsequent operation, , recurrence.  We also discussed the possibility for persistent symptoms.  She voiced understanding of these risks and elected to proceed.  OPERATIVE COURSE: Patient was seen and identified in the preoperative area and marked appropriately.  Surgical consent had been signed. Preoperative IV antibiotic prophylaxis was given. She was transferred to the operating  room and placed in supine position with the right upper extremity on an arm board.  General anesthesia was induced by the anesthesiologist.  Right upper extremity was prepped and draped in normal sterile orthopedic fashion.  A surgical pause was performed between the surgeons, anesthesia, and operating room staff and all were in agreement as to the patient, procedure, and site of procedure.  Tourniquet was placed and padded appropriately to the right upper arm.  The previous carpal tunnel incision was identified and was reopened with careful dissection through the scar tissue.  15 blade was utilized for the skin surface, incision was extended across the wrist crease in [place] style fashion and into the volar forearm.  There was noted to be significant scarring and thickened tissue over the median nerve.  We first identified the nerve proximally, release was performed in a proximal to distal fashion to prevent injury distally to the median nerve.  Antebrachial fascia was decompressed to the level of the transverse carpal ligament.  Median nerve was protected and the transverse carpal ligament was released.  There was noted to be significant scarring around the nerve circumferentially, requiring neurolysis and release of scar adhesions.  We were able to identify the distal fat pad indicating appropriate distal decompression of the median nerve.  Once we were satisfied with our proximal and distal decompression of the median nerve, decision was made to proceed with the planned hypothenar fat pad flap.  A vascularized fat flap was elevated, ensuring adequate blood supply from the hypothenar region of the hand.  The flap was transposed and positioned over the median nerve to provide cushioning and prevent recurrent scarring.  The flap was  secured in place utilizing 3-0 Vicryl suture in figure-of-eight fashion to the radial sided tissue.  Freer elevator was inserted underneath the fat flap as it was secured to ensure  that the nerve was not overly compressed.  At this juncture, the tourniquet was deflated and bipolar electrocautery was utilized for hemostasis.  The tourniquet was deflated at 27 minutes.  Fingertips were pink with brisk capillary refill after deflation of tourniquet.  Copious irrigation was performed of the wound.  Closure was performed utilizing 4-0 nylon suture in horizontal mattress fashion.  10 cc of 0.5% ropivacaine  utilized for anesthetic purposes around the incisional site.  Sterile dressings were applied followed by application of a volar slab plaster wrist splint.  The operative drapes were broken down.  The patient was awoken from anesthesia safely and taken to PACU in stable condition.   Post-operative plan: The patient will recover in the post-anesthesia care unit and then be discharged home.  The patient will be non weight bearing on the right upper extremity in a volar slab plaster wrist splint.   I will see the patient back in the office in  2 weeks  for postoperative followup.  Discharge instructions were provided for appropriate dressing care and pain control medications.   Shaquon Gropp, MD Electronically signed, 11/09/23

## 2023-11-09 NOTE — Anesthesia Procedure Notes (Signed)
 Procedure Name: LMA Insertion Date/Time: 11/09/2023 11:00 AM  Performed by: Claudene Delon SQUIBB, CRNAPre-anesthesia Checklist: Patient identified, Emergency Drugs available, Suction available and Patient being monitored Patient Re-evaluated:Patient Re-evaluated prior to induction Oxygen Delivery Method: Circle System Utilized Preoxygenation: Pre-oxygenation with 100% oxygen Induction Type: IV induction Ventilation: Mask ventilation without difficulty LMA: LMA inserted LMA Size: 4.0 Number of attempts: 1 Airway Equipment and Method: Bite block Placement Confirmation: positive ETCO2 Tube secured with: Tape Dental Injury: Teeth and Oropharynx as per pre-operative assessment

## 2023-11-09 NOTE — Transfer of Care (Signed)
 Immediate Anesthesia Transfer of Care Note  Patient: Debra Wright  Procedure(s) Performed: RIGHT CARPAL TUNNEL RELEASE (Right: Wrist) RIGHT HYPOTHENAR FAT PAD TRANSFER (Right: Wrist)  Patient Location: PACU  Anesthesia Type:General  Level of Consciousness: drowsy  Airway & Oxygen Therapy: Patient Spontanous Breathing and Patient connected to face mask oxygen  Post-op Assessment: Report given to RN and Post -op Vital signs reviewed and stable  Post vital signs: Reviewed and stable  Last Vitals:  Vitals Value Taken Time  BP 131/83 11/09/23 1215  Temp    Pulse 64 11/09/23 1217  Resp 15 11/09/23 1217  SpO2 99 % 11/09/23 1217  Vitals shown include unfiled device data.  Last Pain:  Vitals:   11/09/23 0900  TempSrc: Temporal  PainSc: 0-No pain         Complications: No notable events documented.

## 2023-11-09 NOTE — Anesthesia Postprocedure Evaluation (Signed)
 Anesthesia Post Note  Patient: Debra Wright  Procedure(s) Performed: RIGHT CARPAL TUNNEL RELEASE (Right: Wrist) RIGHT HYPOTHENAR FAT PAD TRANSFER (Right: Wrist)     Patient location during evaluation: PACU Anesthesia Type: General Level of consciousness: sedated and patient cooperative Pain management: pain level controlled Vital Signs Assessment: post-procedure vital signs reviewed and stable Respiratory status: spontaneous breathing Cardiovascular status: stable Anesthetic complications: no   No notable events documented.  Last Vitals:  Vitals:   11/09/23 1300 11/09/23 1315  BP: 115/71 120/65  Pulse: 61 (!) 59  Resp: 19 (!) 59  Temp:  (!) 36.2 C  SpO2: 92% 95%    Last Pain:  Vitals:   11/09/23 1315  TempSrc:   PainSc: 3                  [name]

## 2023-11-09 NOTE — H&P (Signed)
 Debra Wright - 73 y.o. female MRN [id number]  Date of birth: 10/28/1950   HAND SURGERY H&P UPDATE   HPI: Patient is a 73 y.o. female who presents with recurrent right carpal tunnel syndrome.  Index procedure was done over 20 years prior.  Patient denies any changes to their medical history or new systemic symptoms today since her recent office visit.    Past Medical History:  Diagnosis Date   Anxiety    Arthritis    knees; right thumb (10/14/2014)   Basal cell carcinoma    burned off upper lip & right shoulder   Chronic anticoagulation    CHADS VASC=3   Complication of anesthesia    after spinal anesthesia for left knee replacement, bladder did not wake up. Was incontinent for 4 days post surgery   Depression    Fibroid tumor    Frequent diarrhea    GERD (gastroesophageal reflux disease)    High cholesterol    Hypertension    EF 65-70% grade 2DD   Insomnia    Migraine    frequently when I was younger; maybe monthly now (10/14/2014) - none after hysterectomy   PAF (paroxysmal atrial fibrillation) (HCC) 05/02/2018   converted with rate control   Pernicious anemia    pt not aware of this   Sleep apnea    uses a cpap   Type II diabetes mellitus (HCC)    Vitamin B 12 deficiency    Past Surgical History:  Procedure Laterality Date   ABDOMINAL HYSTERECTOMY  1988   ACHILLES TENDON SURGERY Right    APPENDECTOMY  1988   BILATERAL OOPHORECTOMY  2004   CARPAL TUNNEL RELEASE Bilateral 1986   CATARACT EXTRACTION W/ INTRAOCULAR LENS  IMPLANT, BILATERAL Bilateral 2011   COLONOSCOPY     JOINT REPLACEMENT     SHOULDER ARTHROSCOPY DISTAL CLAVICLE EXCISION AND OPEN ROTATOR CUFF REPAIR Right 11/2013   TOENAIL EXCISION Right 04/2018   big toe   TONSILLECTOMY  1950's   TOTAL KNEE ARTHROPLASTY Left 10/13/2014   Procedure: LEFT TOTAL KNEE ARTHROPLASTY;  Surgeon: Lonni CINDERELLA Poli, MD;  Location: [facility] OR;  Service: Orthopedics;  Laterality: Left;   TOTAL KNEE ARTHROPLASTY  Right 12/10/2018   Procedure: RIGHT TOTAL KNEE ARTHROPLASTY;  Surgeon: Poli Lonni CINDERELLA, MD;  Location: [facility] OR;  Service: Orthopedics;  Laterality: Right;   Social History   Socioeconomic History   Marital status: Married    Spouse name: Not on file   Number of children: 1   Years of education: 14   Highest education level: Not on file  Occupational History   Not on file  Tobacco Use   Smoking status: Never   Smokeless tobacco: Never  Vaping Use   Vaping status: Never Used  Substance and Sexual Activity   Alcohol use: Yes    Comment: 10/14/2014 might have a drink when I'm out once/month   Drug use: No   Sexual activity: Not Currently    Birth control/protection: Surgical    Comment: hyst  Other Topics Concern   Not on file  Social History Narrative   She is a retired [employer] - was an [employer] for 41   Married       Right handed    Drinks caffeine one cup of coffee daily   An apartment    Social Drivers of Corporate Investment Banker Strain: Not on file  Food Insecurity: Not on file  Transportation Needs: Not on file  Physical Activity:  Not on file  Stress: Not on file  Social Connections: Not on file   Family History  Problem Relation Age of Onset   Arthritis Mother    Hyperlipidemia Mother    Diabetes Mother    Hypertension Mother    Arthritis Father    Hyperlipidemia Father    Heart disease Father    Diabetes Father    Stroke Brother    Hypertension Brother    Diabetes Brother    Arthritis Maternal Grandmother    Hyperlipidemia Maternal Grandmother    Diabetes Maternal Grandmother    Hypertension Maternal Grandmother    Arthritis Maternal Grandfather    Hyperlipidemia Maternal Grandfather    Hypertension Maternal Grandfather    Arthritis Paternal Grandmother    Hyperlipidemia Paternal Grandmother    Diabetes Paternal Grandmother    Hypertension Paternal Grandmother    Arthritis Paternal Grandfather    Hyperlipidemia Paternal Grandfather    Diabetes  Paternal Grandfather    Hypertension Paternal Grandfather    - negative except otherwise stated in the family history section Allergies  Allergen Reactions   Antivert  [Meclizine  Hcl] Other (See Comments)    makes me feel like my skin is falling off.  03/05/20 pt states she was placed on this a few months back and did not have a reaction   Meclizine  Other (See Comments)    makes me feel like my skin is falling off.  03/05/20 pt states she was placed on this a few months back and did not have a reaction Other reaction(s): jumping out of skin, jittery   Trazodone Hcl Other (See Comments)    Other reaction(s): nightmares   Zolpidem  Other (See Comments)    Reverse affect   Zolpidem  Tartrate     Other reaction(s): doing things in sleep unaware   Prior to Admission medications   Medication Sig Start Date End Date Taking? Authorizing Provider  calcium  carbonate (TUMS - DOSED IN MG ELEMENTAL CALCIUM ) 500 MG chewable tablet Chew 2 tablets by mouth daily as needed for indigestion or heartburn.   Yes [provider]  Calcium  Carbonate-Vitamin D (CALCIUM  600 + D PO) Take 1 tablet by mouth daily.   Yes [provider]  cetirizine (ZYRTEC) 10 MG tablet Take 10 mg by mouth daily as needed for allergies.   Yes [provider]  CINNAMON PO Take 2 tablets by mouth daily.   Yes [provider]  clonazePAM  (KLONOPIN ) 0.5 MG tablet TAKE 1/2 TO 1 (ONE-HALF TO ONE) TABLET BY MOUTH AT BEDTIME AS NEEDED Patient taking differently: Take 0.25-0.5 mg by mouth daily as needed for anxiety. 11/30/20  Yes Hilts, Ozell, MD  Cyanocobalamin  (VITAMIN B12) 1000 MCG TBCR Take 1,000 mcg by mouth daily.   Yes [provider]  diltiazem  (CARDIZEM  CD) 180 MG 24 hr capsule TAKE ONE CAPSULE BY MOUTH ONCE A DAY 05/16/23  Yes Burnard Debby LABOR, MD  escitalopram (LEXAPRO) 10 MG tablet 1/2 tablet once a day x 1 week, then increase to 1 tablet daily 05/10/22  Yes [provider]   Eszopiclone  3 MG TABS Take 1 tablet (3 mg total) by mouth at bedtime as needed. Take immediately before bedtime 11/09/20  Yes Hilts, Michael, MD  Ferrous Sulfate (IRON) 325 (65 Fe) MG TABS Take 325 mg by mouth 2 (two) times daily.   Yes [provider]  gabapentin  (NEURONTIN ) 100 MG capsule Take 1 capsule by mouth daily as needed (neuropathic pain) 10/18/23  Yes Dartha Ernst, MD  glipiZIDE  (  GLUCOTROL ) 10 MG tablet Take 1 tablet (10 mg total) by mouth daily. 05/09/23  Yes Motwani, Obadiah, MD  JARDIANCE  25 MG TABS tablet Take 1 tablet by mouth daily before breakfast 08/28/23  Yes Motwani, Komal, MD  magnesium  oxide (MAG-OX) 400 (241.3 Mg) MG tablet Take 400 mg by mouth daily.   Yes [provider]  metFORMIN  (GLUCOPHAGE ) 1000 MG tablet Take 1 tablet (1,000 mg total) by mouth 2 (two) times daily with a meal. 05/09/23  Yes Motwani, Komal, MD  metoprolol  tartrate (LOPRESSOR ) 25 MG tablet Take 1 tablet (25 mg total) by mouth 2 (two) times daily. 09/06/23  Yes Burnard Debby LABOR, MD  Omega-3 Fatty Acids (FISH OIL) 1200 MG CAPS Take 1,200 mg by mouth 2 (two) times daily.   Yes [provider]  omeprazole  (PRILOSEC) 40 MG capsule Take 1 capsule by mouth once daily 12/09/20  Yes Hilts, Michael, MD  oxybutynin (DITROPAN) 5 MG tablet Take 5 mg by mouth 2 (two) times daily.   Yes [provider]  rosuvastatin  (CRESTOR ) 20 MG tablet Take 1 tablet (20 mg total) by mouth daily. 01/03/21 11/02/23 Yes Burnard Debby LABOR, MD  spironolactone  (ALDACTONE ) 25 MG tablet TAKE ONE-HALF TABLET BY MOUTH ONCE DAILY 07/30/23  Yes Burnard Debby LABOR, MD  vitamin C (ASCORBIC ACID) 250 MG tablet Take 250 mg by mouth daily.   Yes [provider]  warfarin (COUMADIN ) 5 MG tablet TAKE ONE-HALF TO ONE TABLET BY MOUTH DAILY AS DIRECTED BY COUMADIN  CLINIC 09/03/23  Yes Burnard Debby LABOR, MD  Blood Glucose Monitoring Suppl (ACCU-CHEK GUIDE ME) w/Device KIT USE AS DIRECTED.  Dx E11.9. 11/10/20   Hilts, Ozell, MD   glucose blood (ACCU-CHEK GUIDE) test strip CHECK BLOOD GLUCOSE IN THE MORNING AND AT BEDTIME.  E11.9 code. 05/22/22   [place], MD  Semaglutide ,0.25 or 0.5MG /DOS, 2 MG/3ML SOPN Inject 0.25 mg into the skin once a week. 10/29/23   Motwani, Komal, MD   No results found. - Positive ROS: All other systems have been reviewed and were otherwise negative with the exception of those mentioned in the HPI and as above.  Physical Exam: General: No acute distress, resting comfortably Cardiovascular: BUE warm and well perfused, normal rate Respiratory: Normal WOB on RA Skin: Warm and dry Neurologic: Sensation intact distally Psychiatric: Patient is at baseline mood and affect  General: Patient is well appearing and in no distress. Cervical spine mobility is full in all directions:   Skin and Muscle: Well-healed prior bilateral carpal tunnel incisions.     Range of Motion and Palpation Tests: Mobility is full about the elbows with flexion and extension.  Forearm supination and pronation are 85/85 bilaterally.  Wrist flexion/extension is 75/65 bilaterally.  Digital flexion and extension are full.  Thumb opposition is full to the base of the small fingers bilaterally.     No cords or nodules are palpated.  No triggering is observed.     Neurologic, Vascular, Motor: Sensation is diminished to light touch in the right median nerve distribution.    Thenar atrophy: Positive bilateral, more pronounced right side Tinel sign: Positive right carpal tunnel Carpal tunnel compression: Positive right Phalen test: Positive right   Sensory right hand 2-point discrimination (thumb, index, middle): Greater than 8 mm   Motor right hand APB: 4/5   Fingers pink and well perfused.  Capillary refill is brisk.       Assessment/Plan: OR today for right revision carpal tunnel release with hypothenar fat flap. We again  reviewed the risks of surgery which include bleeding, infection, damage to neurovascular  structures, persistent symptoms, need for additional surgery.  Informed consent was signed.  All questions were answered.   Zamantha Strebel [facility], Hand Surgery

## 2023-11-09 NOTE — Discharge Instructions (Addendum)
 Tylenol  taken at 1:30p. May repeat at 7:30 pm as needed.   Hand Surgery Postop Instructions   Dressings: Maintain postoperative dressing until orthopedic follow-up.  Keep operative site clean and dry until orthopedic follow-up.  Wound Care: Keep your hand elevated above the level of your heart.  Do not allow it to dangle by your side. Moving your fingers is advised to stimulate circulation but will depend on the site of your surgery.  If you have a splint applied, your doctor will advise you regarding movement.  Activity: Do not drive or operate machinery until clearance given from physician. No heavy lifting with operative extremity.  Diet:  Drink liquids today or eat a light diet.  You may resume a regular diet tomorrow.    General expectations: Take prescribed medication if given, transition to over-the-counter medication as quickly as possible. Fingers may become slightly swollen.  Call your doctor if any of the following occur: Severe pain not relieved by pain medication. Elevated temperature. Dressing soaked with blood. Inability to move fingers. White or bluish color to fingers.   Per [facility] clinic policy, our goal is ensure optimal postoperative pain control with a multimodal pain management strategy. For all [facility] patients, our goal is to wean post-operative narcotic medications by 6 weeks post-operatively. If this is not possible due to utilization of pain medication prior to surgery, your [facility] doctor will support your acute post-operative pain control for the first 6 weeks postoperatively, with a plan to transition you back to your primary pain team following that. Maralee will work to ensure a therapist, occupational.  Anshul Afton Alderton, M.D. Hand Surgery [facility]  Discharge Instructions After Orthopedic Procedures:  *You may feel tired and weak following your procedure. It is recommended that you limit physical activity for the next 24  hours and rest at home for the remainder of today and tomorrow. *No strenuous activity should be started without your doctor's permission.  Elevate the extremity that you had surgery on to a level above your heart. This should continue for 48 hours or as instructed by your doctor.  If you had hand, arm or shoulder surgery you should move your fingers frequently unless otherwise instructed by your doctor.  If you had foot, knee or leg surgery you should wiggle your toes frequently unless otherwise instructed by your doctor.  Follow your doctor's exact instructions for activity at home. Use your home equipment as instructed. (Crutches, hard shoes, slings etc.)  Limit your activity as instructed by your doctor.  Report to your doctor should any of the following occur: 1. Extreme swelling of your fingers or toes. 2. Inability to wiggle your fingers or toes. 3. Coldness, pale or bluish color in your fingers or toes. 4. Loss of sensation, numbness or tingling of your fingers or toes. 5. Unusual smell or odor from under your dressing or cast. 6. Excessive bleeding or drainage from the surgical site. 7. Pain not relieved by medication your doctor has prescribed for you. 8. Cast or dressing too tight (do not get your dressing or cast wet or put anything under          your dressing or cast.)  *Do not change your dressing unless instructed by your doctor or discharge nurse. Then follow exact instructions.  *Follow labeled instructions for any medications that your doctor may have prescribed for you. *Should any questions or complications develop following your procedure, PLEASE CONTACT YOUR DOCTOR.       Post Anesthesia  Home Care Instructions  Activity: Get plenty of rest for the remainder of the day. A responsible individual must stay with you for 24 hours following the procedure.  For the next 24 hours, DO NOT: -Drive a car -Advertising copywriter -Drink alcoholic beverages -Take any  medication unless instructed by your physician -Make any legal decisions or sign important papers.  Meals: Start with liquid foods such as gelatin or soup. Progress to regular foods as tolerated. Avoid greasy, spicy, heavy foods. If nausea and/or vomiting occur, drink only clear liquids until the nausea and/or vomiting subsides. Call your physician if vomiting continues.  Special Instructions/Symptoms: Your throat may feel dry or sore from the anesthesia or the breathing tube placed in your throat during surgery. If this causes discomfort, gargle with warm salt water. The discomfort should disappear within 24 hours.  If you had a scopolamine patch placed behind your ear for the management of post- operative nausea and/or vomiting:  1. The medication in the patch is effective for 72 hours, after which it should be removed.  Wrap patch in a tissue and discard in the trash. Wash hands thoroughly with soap and water. 2. You may remove the patch earlier than 72 hours if you experience unpleasant side effects which may include dry mouth, dizziness or visual disturbances. 3. Avoid touching the patch. Wash your hands with soap and water after contact with the patch.

## 2023-11-10 ENCOUNTER — Encounter (HOSPITAL_BASED_OUTPATIENT_CLINIC_OR_DEPARTMENT_OTHER): Payer: Self-pay | Admitting: Orthopedic Surgery

## 2023-11-13 ENCOUNTER — Other Ambulatory Visit: Payer: Self-pay

## 2023-11-15 ENCOUNTER — Ambulatory Visit (INDEPENDENT_AMBULATORY_CARE_PROVIDER_SITE_OTHER): Payer: Self-pay

## 2023-11-15 DIAGNOSIS — Z5181 Encounter for therapeutic drug level monitoring: Secondary | ICD-10-CM | POA: Diagnosis not present

## 2023-11-15 LAB — POCT INR: INR: 3.3 — AB (ref 2.0–3.0)

## 2023-11-15 NOTE — Patient Instructions (Signed)
Description   Spoke with patient and instructed to HOLD today's dose and then resume taking Warfarin 1/2 tablet (2.5mg ) daily except 1 tablet (5mg ) on Mondays, Tuesday, and Thursdays.  Stay consistent with greens (3 per week). Recheck INR in 1 week  Anticoagulation Clinic [phone]

## 2023-11-16 ENCOUNTER — Telehealth: Payer: Self-pay | Admitting: Orthopedic Surgery

## 2023-11-16 NOTE — Telephone Encounter (Signed)
Patient called advised her arm is moving around in the soft cast and feels like it is rubbing the incision. The number to contact patient is [phone]

## 2023-11-17 ENCOUNTER — Other Ambulatory Visit: Payer: Self-pay | Admitting: Cardiovascular Disease

## 2023-11-17 DIAGNOSIS — I480 Paroxysmal atrial fibrillation: Secondary | ICD-10-CM

## 2023-11-21 ENCOUNTER — Ambulatory Visit (INDEPENDENT_AMBULATORY_CARE_PROVIDER_SITE_OTHER): Payer: Self-pay

## 2023-11-21 DIAGNOSIS — Z5181 Encounter for therapeutic drug level monitoring: Secondary | ICD-10-CM

## 2023-11-21 LAB — POCT INR: INR: 2.6 (ref 2.0–3.0)

## 2023-11-21 NOTE — Patient Instructions (Signed)
Description   Spoke with patient and instructed to continue taking Warfarin 1/2 tablet (2.5mg ) daily except 1 tablet (5mg ) on Mondays, Tuesday, and Thursdays.  Stay consistent with greens (3 per week). Recheck INR in 2 weeks  Anticoagulation Clinic [phone]

## 2023-11-22 ENCOUNTER — Encounter: Payer: Medicare Other | Admitting: Orthopedic Surgery

## 2023-11-22 ENCOUNTER — Other Ambulatory Visit: Payer: Self-pay | Admitting: Cardiovascular Disease

## 2023-11-22 DIAGNOSIS — I480 Paroxysmal atrial fibrillation: Secondary | ICD-10-CM | POA: Diagnosis not present

## 2023-11-23 ENCOUNTER — Encounter: Payer: Self-pay | Admitting: "Endocrinology

## 2023-11-26 ENCOUNTER — Ambulatory Visit (INDEPENDENT_AMBULATORY_CARE_PROVIDER_SITE_OTHER): Payer: Medicare Other | Admitting: Orthopedic Surgery

## 2023-11-26 ENCOUNTER — Other Ambulatory Visit: Payer: Self-pay | Admitting: Orthopedic Surgery

## 2023-11-26 DIAGNOSIS — G5601 Carpal tunnel syndrome, right upper limb: Secondary | ICD-10-CM

## 2023-11-26 MED ORDER — CEPHALEXIN 500 MG PO CAPS: 500.0000 mg | ORAL_CAPSULE | Freq: Four times a day (QID) | ORAL | 0 refills | Status: AC

## 2023-11-26 NOTE — Progress Notes (Signed)
   Debra Wright - 73 y.o. female MRN [id number]  Date of birth: [birth year]  Office Visit Note: Visit Date: 11/26/2023 PCP: Shon Hale, MD Referred by: Shon Hale, *  Subjective:  HPI: Debra Wright is a 73 y.o. female who presents today for follow up 2 weeks status post right wrist carpal tunnel release.  She has been compliant with the splint, however given that she has to take care of her husband on disability, she feels she may have aggravated the wound with overuse.  Numbness and tingling remains persistent, relatively unchanged postoperatively currently.  Pertinent ROS were reviewed with the patient and found to be negative unless otherwise specified above in HPI.   Assessment & Plan: Visit Diagnoses:  1. Carpal tunnel syndrome, right upper limb     Plan: She does have some erythema around the incisional site on her examination today.  Sutures will remain in place for the time being given the swelling in the area.  There is no expressible drainage on examination today with deep palpation.  This does appear to be more cellulitic in nature.  Short course of oral antibiotics will be started, she will be seen later this week for a wound check.  We did discuss the possibility for postoperative infection that could require irrigation debridement, we will arrange for close follow-up over the next 7 to 10 days.  Dressing changes were instructed to be done on a daily basis, regular wound care per protocol, utilize splint as needed for the wrist.  Follow-up: No follow-ups on file.   Meds & Orders: No orders of the defined types were placed in this encounter.  No orders of the defined types were placed in this encounter.    Procedures: No procedures performed       Objective:   Vital Signs: There were no vitals taken for this visit.  Ortho Exam Right wrist: - Incisional site stemming from the distal forearm to the palmar aspect, sutures are in place - Notable  erythema seen particularly at the wrist crease region - No expressible drainage with deep palpation - Digital range of motion is well-preserved - Sensation remains diminished in the median nerve distribution - Wrist range of motion is preserved without significant pain  Imaging: No results found.   Coutney Wildermuth Trevor Mace, M.D. [facility], Hand Surgery

## 2023-11-27 ENCOUNTER — Other Ambulatory Visit: Payer: Self-pay | Admitting: "Endocrinology

## 2023-11-27 DIAGNOSIS — E1165 Type 2 diabetes mellitus with hyperglycemia: Secondary | ICD-10-CM

## 2023-11-27 NOTE — Telephone Encounter (Signed)
 Medication refill request complete

## 2023-11-29 ENCOUNTER — Ambulatory Visit: Payer: Medicare Other

## 2023-11-29 DIAGNOSIS — G5601 Carpal tunnel syndrome, right upper limb: Secondary | ICD-10-CM

## 2023-11-29 NOTE — Progress Notes (Addendum)
 Patient is 3 weeks out from right carpal tunnel release hypothenar fat pad transfer. Wound check; sent picture to Dr. Fara Boros in regards to this. Wound is looking better; but will hold off on removal of stitches until Monday March 3 when he is back in the office. A new dressing was applied.  Appointment for Monday was made with patient prior to leaving.

## 2023-12-03 ENCOUNTER — Ambulatory Visit (INDEPENDENT_AMBULATORY_CARE_PROVIDER_SITE_OTHER): Payer: Medicare Other | Admitting: Orthopedic Surgery

## 2023-12-03 DIAGNOSIS — G5601 Carpal tunnel syndrome, right upper limb: Secondary | ICD-10-CM

## 2023-12-03 NOTE — Progress Notes (Unsigned)
   Debra Wright - 73 y.o. female MRN [id number]  Date of birth: 07/03/1951  Office Visit Note: Visit Date: 12/03/2023 PCP: Shon Hale, MD Referred by: Shon Hale, *  Subjective:  HPI: Debra Wright is a 73 y.o. female who presents today for follow up 3 weeks status post right carpal tunnel release, right hypothenar fat pad transfer.  Pertinent ROS were reviewed with the patient and found to be negative unless otherwise specified above in HPI.   Assessment & Plan: Visit Diagnoses: No diagnosis found.  Plan: ***  Follow-up: No follow-ups on file.   Meds & Orders: No orders of the defined types were placed in this encounter.  No orders of the defined types were placed in this encounter.    Procedures: No procedures performed       Objective:   Vital Signs: There were no vitals taken for this visit.  Ortho Exam ***  Imaging: No results found.   Leyda Vanderwerf Trevor Mace, M.D. [facility], Hand Surgery

## 2023-12-04 ENCOUNTER — Encounter: Payer: Self-pay | Admitting: "Endocrinology

## 2023-12-04 ENCOUNTER — Ambulatory Visit (HOSPITAL_COMMUNITY): Payer: Medicare Other | Attending: Cardiovascular Disease

## 2023-12-04 ENCOUNTER — Encounter: Payer: Self-pay | Admitting: Orthopedic Surgery

## 2023-12-04 DIAGNOSIS — I350 Nonrheumatic aortic (valve) stenosis: Secondary | ICD-10-CM | POA: Diagnosis not present

## 2023-12-04 DIAGNOSIS — I4811 Longstanding persistent atrial fibrillation: Secondary | ICD-10-CM

## 2023-12-04 LAB — ECHOCARDIOGRAM COMPLETE
AR max vel: 1.23 cm2
AV Area VTI: 1.28 cm2
AV Area mean vel: 1.24 cm2
AV Mean grad: 9 mmHg
AV Peak grad: 17.8 mmHg
Ao pk vel: 2.11 m/s
Area-P 1/2: 3.6 cm2
S' Lateral: 2.7 cm

## 2023-12-05 ENCOUNTER — Other Ambulatory Visit (HOSPITAL_COMMUNITY): Payer: Self-pay

## 2023-12-05 MED ORDER — GABAPENTIN 100 MG PO CAPS: 100.0000 mg | ORAL_CAPSULE | Freq: Every day | ORAL | 0 refills | Status: AC

## 2023-12-06 ENCOUNTER — Ambulatory Visit (INDEPENDENT_AMBULATORY_CARE_PROVIDER_SITE_OTHER): Admitting: Orthopedic Surgery

## 2023-12-06 ENCOUNTER — Telehealth: Payer: Self-pay

## 2023-12-06 DIAGNOSIS — G5601 Carpal tunnel syndrome, right upper limb: Secondary | ICD-10-CM

## 2023-12-06 NOTE — Telephone Encounter (Signed)
 INR due. Called pt, no answer. Left message on voicemail.

## 2023-12-06 NOTE — Progress Notes (Signed)
   Debra Wright - 73 y.o. female MRN [id number]  Date of birth: [birth year]  Office Visit Note: Visit Date: 12/06/2023 PCP: Shon Hale, MD Referred by: Shon Hale, *  Subjective:  HPI: Debra Wright is a 73 y.o. female who presents today for follow up 4 weeks status post right carpal tunnel release, right hypothenar fat pad transfer.  Erythema significantly improved, remains pain-free.  No ongoing drainage.  Pertinent ROS were reviewed with the patient and found to be negative unless otherwise specified above in HPI.   Assessment & Plan: Visit Diagnoses:  1. Carpal tunnel syndrome, right upper limb      Plan: I am pleased to see that her wound does demonstrate improved healing, erythema has resolved significantly as well as the ongoing swelling.  Follow-up in approximate 3 weeks for a repeat wound check.  Follow-up: No follow-ups on file.   Meds & Orders: No orders of the defined types were placed in this encounter.  No orders of the defined types were placed in this encounter.    Procedures: No procedures performed       Objective:   Vital Signs: There were no vitals taken for this visit.  Ortho Exam Right wrist: - Well-approximated incision from the palmar aspect of the hand into the distal forearm, erythema minimal, no drainage - Digital range of motion remains preserved, sensation remains diminished in the median nerve distribution to light touch - Wrist range of motion preserved flexion/extension 65/55 - Composite fist without restriction  Imaging: No results found.   Debra Wright Trevor Mace, M.D. [facility], Hand Surgery

## 2023-12-07 ENCOUNTER — Ambulatory Visit (INDEPENDENT_AMBULATORY_CARE_PROVIDER_SITE_OTHER): Payer: Self-pay | Admitting: Internal Medicine

## 2023-12-07 DIAGNOSIS — I480 Paroxysmal atrial fibrillation: Secondary | ICD-10-CM

## 2023-12-07 DIAGNOSIS — Z7901 Long term (current) use of anticoagulants: Secondary | ICD-10-CM

## 2023-12-07 LAB — POCT INR: INR: 2.8 (ref 2.0–3.0)

## 2023-12-11 ENCOUNTER — Encounter: Payer: Self-pay | Admitting: "Endocrinology

## 2023-12-11 ENCOUNTER — Ambulatory Visit (INDEPENDENT_AMBULATORY_CARE_PROVIDER_SITE_OTHER): Payer: Medicare Other | Admitting: "Endocrinology

## 2023-12-11 VITALS — BP 120/80 | HR 85 | Ht 61.0 in | Wt 234.0 lb

## 2023-12-11 DIAGNOSIS — E11649 Type 2 diabetes mellitus with hypoglycemia without coma: Secondary | ICD-10-CM | POA: Diagnosis not present

## 2023-12-11 DIAGNOSIS — E782 Mixed hyperlipidemia: Secondary | ICD-10-CM | POA: Diagnosis not present

## 2023-12-11 DIAGNOSIS — Z7984 Long term (current) use of oral hypoglycemic drugs: Secondary | ICD-10-CM

## 2023-12-11 MED ORDER — GLIPIZIDE ER 5 MG PO TB24: 5.0000 mg | ORAL_TABLET | Freq: Every day | ORAL | 1 refills | Status: DC

## 2023-12-11 MED ORDER — DEXCOM G7 SENSOR MISC: 1.0000 | 1 refills | Status: DC

## 2023-12-11 NOTE — Progress Notes (Signed)
 Outpatient Endocrinology Note Debra Greenbush, MD    Sherilee Smotherman 1951/07/18 [id number]  Referring Provider: Shon Hale, * Primary Care Provider: Shon Hale, MD Reason for consultation: Subjective  Type 2 diabetes mellitus  Assessment & Plan  Diagnoses and all orders for this visit:  Uncontrolled type 2 diabetes mellitus with hypoglycemia without coma (HCC) -     Lipid panel -     Microalbumin / creatinine urine ratio -     Hemoglobin A1c  Long term (current) use of oral hypoglycemic drugs  Mixed hypercholesterolemia and hypertriglyceridemia  Other orders -     glipiZIDE (GLUCOTROL XL) 5 MG 24 hr tablet; Take 1 tablet (5 mg total) by mouth daily with breakfast. Hold if BG <80    Diabetes complicated by neuropathy Hba1c goal less than 7.5, current Hba1c is 6.9. Will recommend for the following change of medications to: Metformin 1000 mg twice daily Ordered DexCom   Cannot Jardiance 25 mg every day Ozempic 0.25 mg/week not covered  Stop Glipizide XL 10 mg every morning (hold if blood sugar is less than 100)  Can't afford Ozempic- liked it but cannot afford No history of MEN syndrome/medullary thyroid cancer/pancreatitis or pancreatic cancer in self or family  Start gabapentin 100 mg qd upto 3 pills PRN neuropathic burning pain  Check BG alternating times of the day and bring log  No known contraindications to any of above medications  Hyperlipidemia -Last LDL near goal: 82 -on rosuvastatin 20 mg QD -Follow low fat diet and exercise   -Blood pressure goal <140/90 - Microalbumin/creatinine at goal < 30 -not on ACE/ARB -diet changes including salt restriction -limit eating outside -counseled BP targets per standards of diabetes care -Uncontrolled blood pressure can lead to retinopathy, nephropathy and cardiovascular and atherosclerotic heart disease  -Na low at 131, new finding  Speak to PCP about hyponatremia, pt is every other day  spirolactone 25 mg  Pt understands and agreed  Reviewed and counseled on: -A1C target -Blood sugar targets -Complications of uncontrolled diabetes  -Checking blood sugar before meals and bedtime and bring log next visit -All medications with mechanism of action and side effects -Hypoglycemia management: rule of 15's, Glucagon Emergency Kit and medical alert ID -low-carb low-fat plate-method diet -At least 20 minutes of physical activity per day -Annual dilated retinal eye exam and foot exam -compliance and follow up needs -follow up as scheduled or earlier if problem gets worse  Call if blood sugar is less than 70 or consistently above 250    Take a 15 gm snack of carbohydrate at bedtime before you go to sleep if your blood sugar is less than 100.    If you are going to fast after midnight for a test or procedure, ask your physician for instructions on how to reduce/decrease your insulin dose.    Call if blood sugar is less than 70 or consistently above 250  -Treating a low sugar by rule of 15  (15 gms of sugar every 15 min until sugar is more than 70) If you feel your sugar is low, test your sugar to be sure If your sugar is low (less than 70), then take 15 grams of a fast acting Carbohydrate (3-4 glucose tablets or glucose gel or 4 ounces of juice or regular soda) Recheck your sugar 15 min after treating low to make sure it is more than 70 If sugar is still less than 70, treat again with 15 grams of carbohydrate  Don't drive the hour of hypoglycemia  If unconscious/unable to eat or drink by mouth, use glucagon injection or nasal spray baqsimi and call 911. Can repeat again in 15 min if still unconscious.  Return in about 3 months (around 03/12/2024) for visit and fasting 8 am labs before next visit.   I spent more than 50% of today's visit counseling patient on symptoms, examination findings, lab findings, imaging results, treatment decisions and monitoring and  prognosis. The patient understood the recommendations and agrees with the treatment plan. All questions regarding treatment plan were fully answered.  Debra Manteca, MD  12/11/23    History of Present Illness Debra Wright is a 73 y.o. year old female who presents for evaluation of Type 2 diabetes mellitus.  Aadya Kindler was first diagnosed in 2014.   Diabetes education +  Home diabetes regimen: Metformin 1000 mg twice daily Glipizide XL 10 mg every morning   Cannot Jardiance 25 mg every day Ozempic 0.25 mg/week not covered  Non-insulin hypoglycemic drugs previously used: Metformin, glipizide, repaglinide 1 mg since 3/22 (stopped in 05/2022), Januvia  COMPLICATIONS -  MI/Stroke -  retinopathy, last eye exam 2024 +  neuropathy -  nephropathy  BLOOD SUGAR DATA 60-160s, once a day, checks different times  Did not bring meter   Physical Exam  BP 120/80   Pulse 85   Ht 5\' 1"  (1.549 m)   Wt 234 lb (106.1 kg)   SpO2 98%   BMI 44.21 kg/m    Constitutional: well developed, well nourished Head: normocephalic, atraumatic Eyes: sclera anicteric, no redness Neck: supple Lungs: normal respiratory effort Neurology: alert and oriented Skin: dry, bruises all over body (on warfarin for A.fib) Musculoskeletal: no appreciable defects Psychiatric: normal mood and affect Diabetic Foot Exam - Simple   No data filed      Current Medications Patient's Medications  New Prescriptions   GLIPIZIDE (GLUCOTROL XL) 5 MG 24 HR TABLET    Take 1 tablet (5 mg total) by mouth daily with breakfast. Hold if BG <80  Previous Medications   BLOOD GLUCOSE MONITORING SUPPL (ACCU-CHEK GUIDE ME) W/DEVICE KIT    USE AS DIRECTED.  Dx E11.9.   CALCIUM CARBONATE (TUMS - DOSED IN MG ELEMENTAL CALCIUM) 500 MG CHEWABLE TABLET    Chew 2 tablets by mouth daily as needed for indigestion or heartburn.   CALCIUM CARBONATE-VITAMIN D (CALCIUM 600 + D PO)    Take 1 tablet by mouth daily.   CETIRIZINE (ZYRTEC)  10 MG TABLET    Take 10 mg by mouth daily as needed for allergies.   CINNAMON PO    Take 2 tablets by mouth daily.   CLONAZEPAM (KLONOPIN) 0.5 MG TABLET    TAKE 1/2 TO 1 (ONE-HALF TO ONE) TABLET BY MOUTH AT BEDTIME AS NEEDED   CYANOCOBALAMIN (VITAMIN B12) 1000 MCG TBCR    Take 1,000 mcg by mouth daily.   DILTIAZEM (CARDIZEM CD) 180 MG 24 HR CAPSULE    TAKE ONE CAPSULE BY MOUTH ONCE A DAY   ESCITALOPRAM (LEXAPRO) 10 MG TABLET    1/2 tablet once a day x 1 week, then increase to 1 tablet daily   ESZOPICLONE 3 MG TABS    Take 1 tablet (3 mg total) by mouth at bedtime as needed. Take immediately before bedtime   FERROUS SULFATE (IRON) 325 (65 FE) MG TABS    Take 325 mg by mouth 2 (two) times daily.   GABAPENTIN (NEURONTIN) 100 MG CAPSULE  Take 1 capsule (100 mg total) by mouth daily.   GLUCOSE BLOOD (ACCU-CHEK GUIDE) TEST STRIP    CHECK BLOOD GLUCOSE IN THE MORNING AND AT BEDTIME.  E11.9 code.   JARDIANCE 25 MG TABS TABLET    Take 1 tablet by mouth daily before breakfast   MAGNESIUM OXIDE (MAG-OX) 400 (241.3 MG) MG TABLET    Take 400 mg by mouth daily.   METFORMIN (GLUCOPHAGE) 1000 MG TABLET    TAKE ONE TABLET BY MOUTH TWICE DAILY WITH A MEAL   METOPROLOL TARTRATE (LOPRESSOR) 25 MG TABLET    Take 1 tablet (25 mg total) by mouth 2 (two) times daily.   OMEGA-3 FATTY ACIDS (FISH OIL) 1200 MG CAPS    Take 1,200 mg by mouth 2 (two) times daily.   OMEPRAZOLE (PRILOSEC) 40 MG CAPSULE    Take 1 capsule by mouth once daily   OXYBUTYNIN (DITROPAN) 5 MG TABLET    Take 5 mg by mouth 2 (two) times daily.   OXYCODONE (ROXICODONE) 5 MG IMMEDIATE RELEASE TABLET    Take 1 tablet (5 mg total) by mouth every 6 (six) hours as needed.   ROSUVASTATIN (CRESTOR) 20 MG TABLET    Take 1 tablet (20 mg total) by mouth daily.   SEMAGLUTIDE,0.25 OR 0.5MG /DOS, 2 MG/3ML SOPN    Inject 0.25 mg into the skin once a week.   SPIRONOLACTONE (ALDACTONE) 25 MG TABLET    TAKE HALF TABLET BY MOUTH DAILY   VITAMIN C (ASCORBIC ACID) 250 MG  TABLET    Take 250 mg by mouth daily.   WARFARIN (COUMADIN) 5 MG TABLET    TAKE ONE-HALF TO ONE TABLET BY MOUTH DAILY AS DIRECTED BY COUMADIN CLINIC  Modified Medications   No medications on file  Discontinued Medications   GLIPIZIDE (GLUCOTROL) 10 MG TABLET    Take 1 tablet (10 mg total) by mouth daily.    Allergies Allergies  Allergen Reactions   Antivert [Meclizine Hcl] Other (See Comments)    "makes me feel like my skin is falling off".  03/05/20 pt states she was placed on this a few months back and did not have a reaction   Meclizine Other (See Comments)    "makes me feel like my skin is falling off".  03/05/20 pt states she was placed on this a few months back and did not have a reaction Other reaction(s): jumping out of skin, jittery   Trazodone Hcl Other (See Comments)    Other reaction(s): nightmares   Zolpidem Other (See Comments)    Reverse affect   Zolpidem Tartrate     Other reaction(s): doing things in sleep unaware    Past Medical History Past Medical History:  Diagnosis Date   Anxiety    Arthritis    "knees; right thumb" (10/14/2014)   Basal cell carcinoma    "burned off upper lip & right shoulder"   Chronic anticoagulation    CHADS VASC=3   Complication of anesthesia    after spinal anesthesia for left knee replacement, bladder did "not wake up". Was incontinent for 4 days post surgery   Depression    Fibroid tumor    Frequent diarrhea    GERD (gastroesophageal reflux disease)    High cholesterol    Hypertension    EF 65-70% grade 2DD   Insomnia    Migraine    "frequently when I was younger; maybe monthly now" (10/14/2014) - none after hysterectomy   PAF (paroxysmal atrial fibrillation) (HCC) 05/02/2018  converted with rate control   Pernicious anemia    pt not aware of this   Sleep apnea    uses a cpap   Type II diabetes mellitus (HCC)    Vitamin B 12 deficiency     Past Surgical History Past Surgical History:  Procedure Laterality Date    ABDOMINAL HYSTERECTOMY  1988   ACHILLES TENDON SURGERY Right    APPENDECTOMY  1988   BILATERAL OOPHORECTOMY  2004   CARPAL TUNNEL RELEASE Bilateral 1986   CARPAL TUNNEL RELEASE Right 11/09/2023   Procedure: RIGHT CARPAL TUNNEL RELEASE;  Surgeon: Samuella Cota, MD;  Location: [facility];  Service: Orthopedics;  Laterality: Right;   CATARACT EXTRACTION W/ INTRAOCULAR LENS  IMPLANT, BILATERAL Bilateral 2011   COLONOSCOPY     HYPOTHENAR FAT PAD TRANSFER Right 11/09/2023   Procedure: RIGHT HYPOTHENAR FAT PAD TRANSFER;  Surgeon: Samuella Cota, MD;  Location: [facility];  Service: Orthopedics;  Laterality: Right;   JOINT REPLACEMENT     SHOULDER ARTHROSCOPY DISTAL CLAVICLE EXCISION AND OPEN ROTATOR CUFF REPAIR Right 11/2013   TOENAIL EXCISION Right 04/2018   "big toe"   TONSILLECTOMY  1950's   TOTAL KNEE ARTHROPLASTY Left 10/13/2014   Procedure: LEFT TOTAL KNEE ARTHROPLASTY;  Surgeon: Kathryne Hitch, MD;  Location: [facility] OR;  Service: Orthopedics;  Laterality: Left;   TOTAL KNEE ARTHROPLASTY Right 12/10/2018   Procedure: RIGHT TOTAL KNEE ARTHROPLASTY;  Surgeon: Kathryne Hitch, MD;  Location: [facility] OR;  Service: Orthopedics;  Laterality: Right;    Family History family history includes Arthritis in her father, maternal grandfather, maternal grandmother, mother, paternal grandfather, and paternal grandmother; Diabetes in her brother, father, maternal grandmother, mother, paternal grandfather, and paternal grandmother; Heart disease in her father; Hyperlipidemia in her father, maternal grandfather, maternal grandmother, mother, paternal grandfather, and paternal grandmother; Hypertension in her brother, maternal grandfather, maternal grandmother, mother, paternal grandfather, and paternal grandmother; Stroke in her brother.  Social History Social History   Socioeconomic History   Marital status: Married    Spouse name: Not on file   Number of children: 1    Years of education: 14   Highest education level: Not on file  Occupational History   Not on file  Tobacco Use   Smoking status: Never   Smokeless tobacco: Never  Vaping Use   Vaping status: Never Used  Substance and Sexual Activity   Alcohol use: Yes    Comment: 10/14/2014 "might have a drink when I'm out once/month"   Drug use: No   Sexual activity: Not Currently    Birth control/protection: Surgical    Comment: hyst  Other Topics Concern   Not on file  Social History Narrative   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   Married       Right handed    Drinks caffeine one cup of coffee daily   An apartment    Social Drivers of Corporate investment banker Strain: Not on file  Food Insecurity: Not on file  Transportation Needs: Not on file  Physical Activity: Not on file  Stress: Not on file  Social Connections: Not on file  Intimate Partner Violence: Not on file    Lab Results  Component Value Date   HGBA1C 6.9 (A) 10/29/2023   Lab Results  Component Value Date   CHOL 122 05/07/2023   Lab Results  Component Value Date   HDL 37.20 (L) 05/07/2023   Lab Results  Component Value Date   LDLCALC 66 08/07/2018   Lab Results  Component Value Date   TRIG 245.0 (H) 05/07/2023   Lab Results  Component Value Date   CHOLHDL 3 05/07/2023   Lab Results  Component Value Date   CREATININE 0.81 11/08/2023   Lab Results  Component Value Date   GFR 82.37 05/07/2023   Lab Results  Component Value Date   MICROALBUR 1.2 05/07/2023      Component Value Date/Time   NA 133 (L) 11/08/2023 1330   NA 141 01/15/2020 1417   K 4.9 11/08/2023 1330   CL 100 11/08/2023 1330   CO2 22 11/08/2023 1330   GLUCOSE 280 (H) 11/08/2023 1330   BUN 18 11/08/2023 1330   BUN 8 01/15/2020 1417   CREATININE 0.81 11/08/2023 1330   CREATININE 0.59 (L) 11/26/2019 1319   CALCIUM 8.9 11/08/2023 1330   PROT 6.1 05/07/2023 1432   ALBUMIN 3.6 05/07/2023 1432   AST 22 05/07/2023 1432    ALT 22 05/07/2023 1432   ALKPHOS 62 05/07/2023 1432   BILITOT 0.3 05/07/2023 1432   GFRNONAA >60 11/08/2023 1330   GFRAA 110 01/15/2020 1417      Latest Ref Rng & Units 11/08/2023    1:30 PM 05/07/2023    2:32 PM 02/01/2023    2:23 PM  BMP  Glucose 70 - 99 mg/dL 604  540  981   BUN 8 - 23 mg/dL 18  15  14    Creatinine 0.44 - 1.00 mg/dL 1.91  4.78  2.95   Sodium 135 - 145 mmol/L 133  137  140   Potassium 3.5 - 5.1 mmol/L 4.9  4.1  4.1   Chloride 98 - 111 mmol/L 100  104  102   CO2 22 - 32 mmol/L 22  23  28    Calcium 8.9 - 10.3 mg/dL 8.9  8.7  9.2        Component Value Date/Time   WBC 8.8 07/12/2022 1803   RBC 4.92 07/12/2022 1803   HGB 14.2 07/12/2022 1803   HCT 44.6 07/12/2022 1803   PLT 264 07/12/2022 1803   MCV 90.7 07/12/2022 1803   MCH 28.9 07/12/2022 1803   MCHC 31.8 07/12/2022 1803   RDW 14.4 07/12/2022 1803   LYMPHSABS 2.7 07/12/2022 1803   MONOABS 0.7 07/12/2022 1803   EOSABS 0.1 07/12/2022 1803   BASOSABS 0.0 07/12/2022 1803     Parts of this note may have been dictated using voice recognition software. There may be variances in spelling and vocabulary which are unintentional. Not all errors are proofread. Please notify the Thereasa Parkin if any discrepancies are noted or if the meaning of any statement is not clear.

## 2023-12-11 NOTE — Patient Instructions (Signed)

## 2023-12-12 ENCOUNTER — Ambulatory Visit (HOSPITAL_BASED_OUTPATIENT_CLINIC_OR_DEPARTMENT_OTHER): Admitting: Pulmonary Disease

## 2023-12-12 ENCOUNTER — Encounter (HOSPITAL_BASED_OUTPATIENT_CLINIC_OR_DEPARTMENT_OTHER): Payer: Self-pay | Admitting: Pulmonary Disease

## 2023-12-12 VITALS — BP 108/60 | HR 80 | Resp 14 | Ht 61.0 in | Wt 230.2 lb

## 2023-12-12 DIAGNOSIS — G4733 Obstructive sleep apnea (adult) (pediatric): Secondary | ICD-10-CM | POA: Diagnosis not present

## 2023-12-12 DIAGNOSIS — E119 Type 2 diabetes mellitus without complications: Secondary | ICD-10-CM | POA: Diagnosis not present

## 2023-12-12 DIAGNOSIS — G4700 Insomnia, unspecified: Secondary | ICD-10-CM

## 2023-12-12 NOTE — Progress Notes (Signed)
 Subjective:    Patient ID: Debra Wright, female    DOB: 06-Aug-1951, 73 y.o.   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 She has  sleep maintenance insomnia,multiple medications over the past few years including Ambien, Benadryl, Tylenol PM and melatonin. She has been maintained on Lunesta 3 mg since 2014   PMH - atrial fib  RT TKR   Last OV 2022 The patient, with a history of insomnia and sleep apnea, presents for a routine follow-up after a two-year gap. She has been using a CPAP machine since 2016, which she reports is still functioning well, but she has noticed some air leakage around the tubing connections and a whistling sound when exhaling. She requests a new machine, citing the age of her current one. She uses the machine every night unless her grandchildren are staying over.  She also reports having had hand surgery for carpal tunnel syndrome four weeks ago. The nerve was irritated due to the bylon sheath coming off, and she describes a sensation like sand running between her fingers. She is told it may take a few months to fully recover.  The patient also mentions taking Lunesta for insomnia and clonazepam occasionally, not at bedtime. She was on Ozempic for an unspecified condition, but had to stop due to the high cost.   CPAP download shows excellent control of events on auto settings 10 to 15 cm with average pressure of 15 cm.  She is very compliant 8` hours per night without a single missed night.  CPAP is only helped improve daytime somnolence and fatigue   significant tests/ events reviewed HST  03/2017  severe OSA with 35 events per hour Auto CPAP 10-18 >> pressure 13   Review of Systems neg for any significant sore throat, dysphagia, itching, sneezing, nasal congestion or excess/ purulent secretions, fever, chills, sweats, unintended wt loss, pleuritic or exertional cp, hempoptysis, orthopnea pnd or change in chronic leg swelling. Also denies presyncope,  palpitations, heartburn, abdominal pain, nausea, vomiting, diarrhea or change in bowel or urinary habits, dysuria,hematuria, rash, arthralgias, visual complaints, headache, numbness weakness or ataxia.     Objective:   Physical Exam  Gen. Pleasant, obese, in no distress ENT - no lesions, no post nasal drip Neck: No JVD, no thyromegaly, no carotid bruits Lungs: no use of accessory muscles, no dullness to percussion, decreased without rales or rhonchi  Cardiovascular: Rhythm regular, heart sounds  normal, no murmurs or gallops, no peripheral edema Musculoskeletal: No deformities, no cyanosis or clubbing , no tremors       Assessment & Plan:   Obstructive Sleep Apnea She has been using a CPAP machine since 2016, maintaining a pressure between 10 and 15 cm H2O, with a requirement of up to 14.9 cm H2O. Her apnea events are minimal, and she uses the machine for over 8 hours nightly. She reports a potential air leak at the tubing connections, likely due to the machine's age, and qualifies for a new CPAP machine. - Prescribe a new autoCPAP machine with the same settings 10-15 cm - Advise retaining the old machine as a spare.  Insomnia She is taking Lunesta for insomnia with no issues falling asleep. She occasionally uses clonazepam but not with Lunesta. It was confirmed that both medications should not be taken simultaneously to prevent excessive sedation. - Continue Lunesta for insomnia. - Avoid concurrent use of clonazepam with Lunesta.  Type 2 Diabetes Mellitus She discontinued Ozempic  after two months due to high out-of-pocket costs, despite insurance coverage. No current issues with diabetes management were reported.

## 2023-12-12 NOTE — Patient Instructions (Signed)
 X rx for replacement autoCPAP 10-15 cm

## 2023-12-17 ENCOUNTER — Encounter: Payer: Self-pay | Admitting: "Endocrinology

## 2023-12-17 MED ORDER — EMPAGLIFLOZIN 25 MG PO TABS: 25.0000 mg | ORAL_TABLET | Freq: Every day | ORAL | 0 refills | Status: DC

## 2023-12-17 MED ORDER — SEMAGLUTIDE(0.25 OR 0.5MG/DOS) 2 MG/3ML ~~LOC~~ SOPN: 0.2500 mg | PEN_INJECTOR | SUBCUTANEOUS | 2 refills | Status: DC

## 2023-12-17 MED ORDER — GLIPIZIDE ER 5 MG PO TB24: 5.0000 mg | ORAL_TABLET | Freq: Every day | ORAL | 1 refills | Status: DC

## 2023-12-20 ENCOUNTER — Ambulatory Visit (INDEPENDENT_AMBULATORY_CARE_PROVIDER_SITE_OTHER): Payer: Self-pay

## 2023-12-20 DIAGNOSIS — Z5181 Encounter for therapeutic drug level monitoring: Secondary | ICD-10-CM

## 2023-12-20 DIAGNOSIS — E871 Hypo-osmolality and hyponatremia: Secondary | ICD-10-CM | POA: Diagnosis not present

## 2023-12-20 DIAGNOSIS — F5104 Psychophysiologic insomnia: Secondary | ICD-10-CM | POA: Diagnosis not present

## 2023-12-20 DIAGNOSIS — F410 Panic disorder [episodic paroxysmal anxiety] without agoraphobia: Secondary | ICD-10-CM | POA: Diagnosis not present

## 2023-12-20 LAB — POCT INR: INR: 1.8 — AB (ref 2.0–3.0)

## 2023-12-20 NOTE — Patient Instructions (Signed)
 Description   Spoke with patient and instructed to take 1.5 tablets today and then continue taking Warfarin 1/2 tablet (2.5mg ) daily except 1 tablet (5mg ) on Mondays, Tuesday, and Thursdays.  Stay consistent with greens (3 per week). Recheck INR in 2 weeks  Anticoagulation Clinic [phone]

## 2023-12-25 DIAGNOSIS — I4821 Permanent atrial fibrillation: Secondary | ICD-10-CM | POA: Diagnosis not present

## 2023-12-25 DIAGNOSIS — E1169 Type 2 diabetes mellitus with other specified complication: Secondary | ICD-10-CM | POA: Diagnosis not present

## 2023-12-25 DIAGNOSIS — I350 Nonrheumatic aortic (valve) stenosis: Secondary | ICD-10-CM | POA: Diagnosis not present

## 2023-12-25 DIAGNOSIS — I10 Essential (primary) hypertension: Secondary | ICD-10-CM | POA: Diagnosis not present

## 2023-12-26 ENCOUNTER — Telehealth: Payer: Self-pay | Admitting: *Deleted

## 2023-12-26 NOTE — Telephone Encounter (Signed)
   Pre-operative Risk Assessment    Patient Name: Debra Wright  DOB: [birth year] MRN: [id number]   Date of last office visit: 10-2023 Date of next office visit: 01-2024    Request for Surgical Clearance     Procedure:   L5-S1 right ES1   Date of Surgery:  Clearance  01-31-24                                  Surgeon:  DR  Aileen Fass  Surgeon's Group or Practice Name:  [facility]  Phone number:  [phone]  Fax number:  [phone]   Type of Clearance Requested:   - Pharmacy:  Hold Warfarin (Coumadin) 5 DAY S PRIOR AND TAKE AFTER    Type of Anesthesia:  Spinal     Signed, Tearia Gibbs M   12/26/2023, 4:22 PM

## 2023-12-27 NOTE — Telephone Encounter (Signed)
 Patient with diagnosis of Afib on Warfarin for anticoagulation.    Procedure: L5-S1 right ES1  Date of procedure: 01/31/24   CHA2DS2-VASc Score = 4   This indicates a 4.8% annual risk of stroke. The patient's score is based upon: CHF History: 0 HTN History: 1 Diabetes History: 1 Stroke History: 0 Vascular Disease History: 0 Age Score: 1 Gender Score: 1    CrCl 67 mL/min  Per office protocol, patient can hold warfarin for 5 days prior to procedure.   Patient will not need bridging with Lovenox (enoxaparin) around procedure.  **This guidance is not considered finalized until pre-operative APP has relayed final recommendations.**

## 2023-12-27 NOTE — Telephone Encounter (Signed)
   Patient Name: Debra Wright  DOB: 1950/11/10 MRN: [id number]  Primary Cardiologist: Nicki Guadalajara, MD  Chart reviewed as part of pre-operative protocol coverage. Pre-op clearance already addressed by colleagues in earlier phone notes. To summarize recommendations:  -Although medical clearance was not requested, we did speak to the patient on the phone back in January and she was able to complete 4 mets at that time. No CP,SOB.   Per office protocol, patient can hold warfarin for 5 days prior to procedure.   Patient will not need bridging with Lovenox (enoxaparin) around procedure.Please resume when medically safe to do so.  Will route this bundled recommendation to requesting provider via Epic fax function and remove from pre-op pool. Please call with questions.  Sharlene Dory, PA-C 12/27/2023, 4:24 PM

## 2023-12-31 DIAGNOSIS — I480 Paroxysmal atrial fibrillation: Secondary | ICD-10-CM | POA: Diagnosis not present

## 2023-12-31 DIAGNOSIS — E1169 Type 2 diabetes mellitus with other specified complication: Secondary | ICD-10-CM | POA: Diagnosis not present

## 2023-12-31 DIAGNOSIS — I350 Nonrheumatic aortic (valve) stenosis: Secondary | ICD-10-CM | POA: Diagnosis not present

## 2024-01-02 ENCOUNTER — Ambulatory Visit (INDEPENDENT_AMBULATORY_CARE_PROVIDER_SITE_OTHER)

## 2024-01-02 DIAGNOSIS — Z5181 Encounter for therapeutic drug level monitoring: Secondary | ICD-10-CM

## 2024-01-02 LAB — POCT INR: INR: 4.3 — AB (ref 2.0–3.0)

## 2024-01-02 NOTE — Patient Instructions (Signed)
 Description   Spoke with patient and instructed to HOLD today's dose and then continue taking Warfarin 1/2 tablet (2.5mg ) daily except 1 tablet (5mg ) on Mondays, Wednesdays, and Fridays.   Stay consistent with greens (3 per week). Recheck INR in 2 weeks  Anticoagulation Clinic [phone]

## 2024-01-04 DIAGNOSIS — Z1231 Encounter for screening mammogram for malignant neoplasm of breast: Secondary | ICD-10-CM | POA: Diagnosis not present

## 2024-01-05 ENCOUNTER — Encounter (HOSPITAL_BASED_OUTPATIENT_CLINIC_OR_DEPARTMENT_OTHER): Payer: Self-pay | Admitting: Pulmonary Disease

## 2024-01-14 ENCOUNTER — Ambulatory Visit: Admitting: Orthopedic Surgery

## 2024-01-16 ENCOUNTER — Telehealth: Payer: Self-pay | Admitting: *Deleted

## 2024-01-16 ENCOUNTER — Ambulatory Visit (INDEPENDENT_AMBULATORY_CARE_PROVIDER_SITE_OTHER)

## 2024-01-16 DIAGNOSIS — Z5181 Encounter for therapeutic drug level monitoring: Secondary | ICD-10-CM

## 2024-01-16 LAB — POCT INR: INR: 5.1 — AB (ref 2.0–3.0)

## 2024-01-16 NOTE — Patient Instructions (Signed)
 Description   Spoke with patient and instructed to HOLD today's dose and tomorrow's dose and then continue taking Warfarin 1/2 tablet (2.5mg ) daily except 1 tablet (5mg ) on Mondays, Wednesdays, and Fridays.   Stay consistent with greens (3 per week). Recheck INR in 1 week  Anticoagulation Clinic [phone]

## 2024-01-16 NOTE — Telephone Encounter (Signed)
 Called pt to remind INR is due. There was no answer so left her a message. Will follow up.

## 2024-01-20 IMAGING — MR MR HEAD WO/W CM
12 series · 48 of 48 positions shown · IV contrast (multihance)
Comparison: Prior head CT 09/29/2021.  Brain MRI 11/22/2012.

CLINICAL DATA: Memory loss. Additional history provided by scanning
technologist: Memory loss, balance issues for 1 year, patient
reports a fall 3 weeks ago and 6 months ago.

EXAM:
MRI HEAD WITHOUT AND WITH CONTRAST
TECHNIQUE: Multiplanar, multiecho pulse sequences of the brain and surrounding
structures were obtained without and with intravenous contrast.
CONTRAST:  20mL MULTIHANCE GADOBENATE DIMEGLUMINE 529 MG/ML IV SOLN

[Series 2: T1 · sagittal · 5.0mm · 0.45mm/px · 3 of 26 slices shown]
[im 1/26]
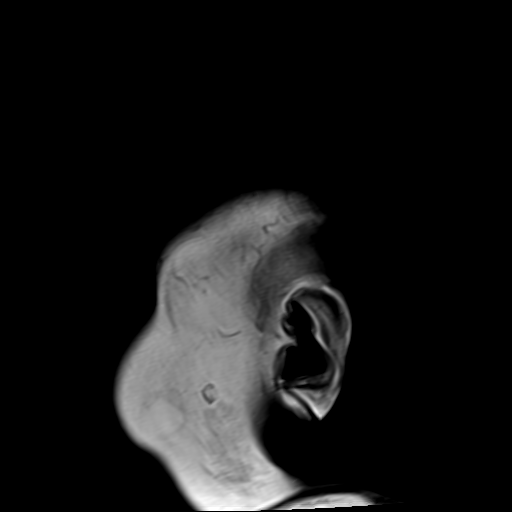
[im 13/26]
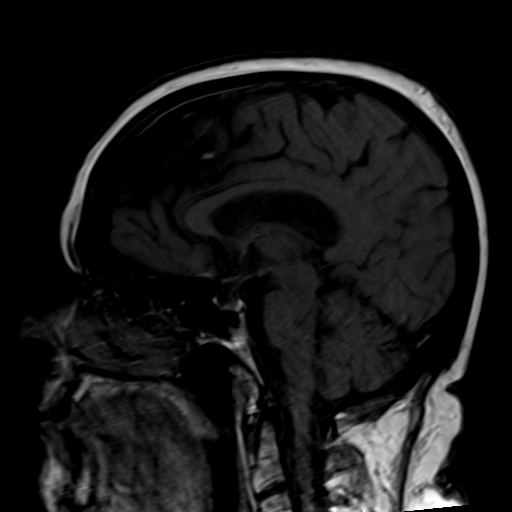
[im 26/26]
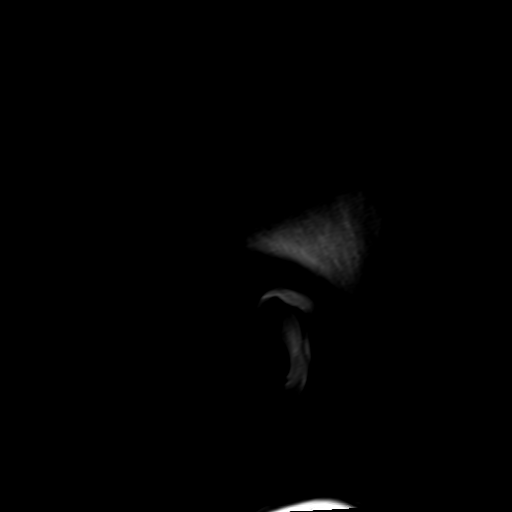

[Series 3: ax ep2d_diff_3 · axial · 3.0mm · 1.80mm/px · z∈[-36,+122]mm · 6 of 109 slices shown]
[im 1/109]
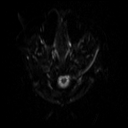
[im 22/109]
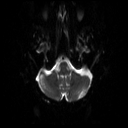
[im 44/109]
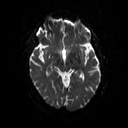
[im 65/109]
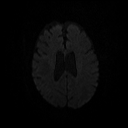
[im 87/109]
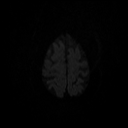
[im 109/109]
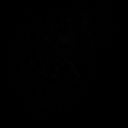

[Series 4: ax ep2d_diff_3_adc · axial · 3.0mm · 1.80mm/px · z∈[-36,+122]mm · 3 of 53 slices shown]
[im 1/53]
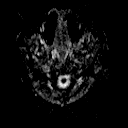
[im 27/53]
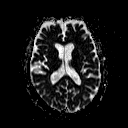
[im 53/53]
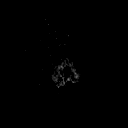

[Series 5: cor ep2d_diffusion · coronal · 5.0mm · 1.77mm/px · 3 of 59 slices shown]
[im 1/59]
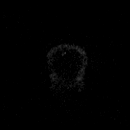
[im 30/59]
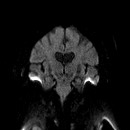
[im 59/59]
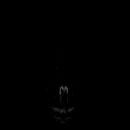

[Series 6: cor ep2d_diffusion_adc · coronal · 5.0mm · 1.77mm/px · 2 of 30 slices shown]
[im 1/30]
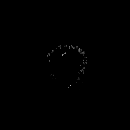
[im 30/30]
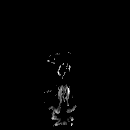

[Series 7: T2 · axial · 5.0mm · 0.51mm/px · z∈[-36,+122]mm · 2 of 28 slices shown (1 of 2)]
[im 1/28]
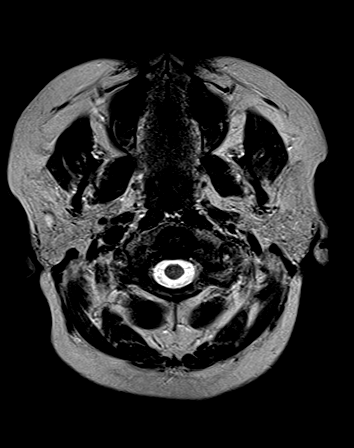
[im 28/28]
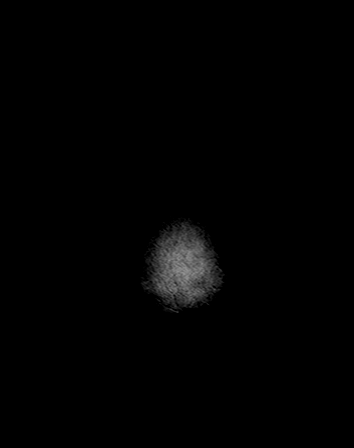

[Series 9: swi_images · axial · 3.0mm · 0.90mm/px · z∈[-37,+124]mm · 3 of 56 slices shown]
[im 1/56]
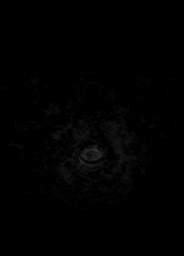
[im 28/56]
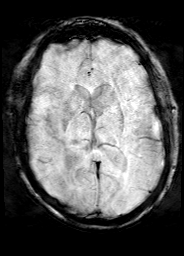
[im 56/56]
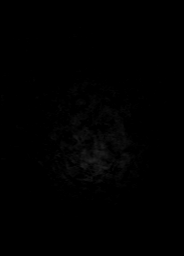

[Series 10: FLAIR · axial · 3.0mm · 0.43mm/px · z∈[-38,+120]mm · 2 of 42 slices shown]
[im 1/42]
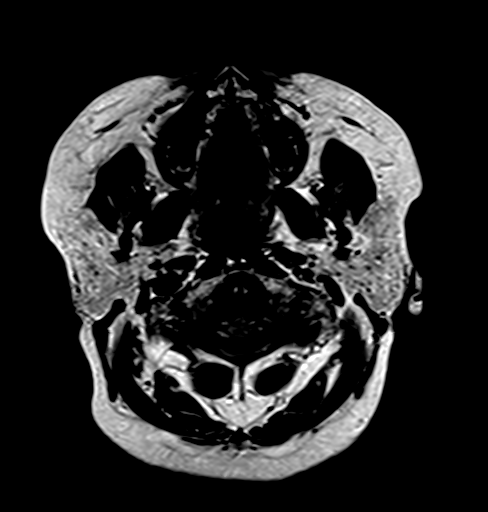
[im 42/42]
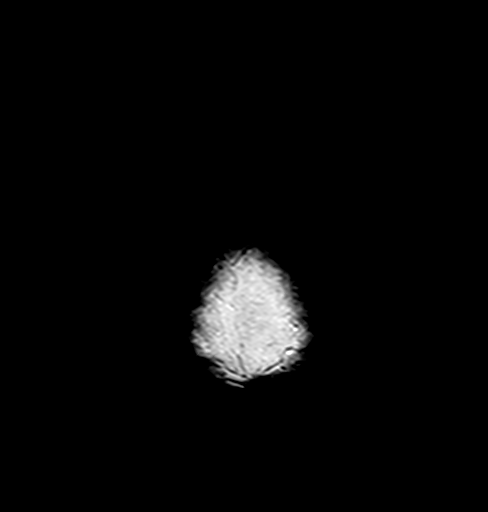

[Series 11: t1_mpr_tra · axial · 1.0mm · 0.75mm/px · z∈[-43,+128]mm · 10 of 176 slices shown]
[im 1/176]
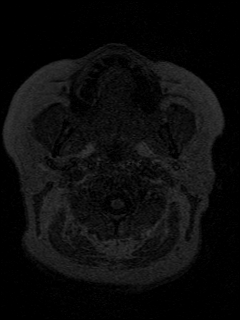
[im 20/176]
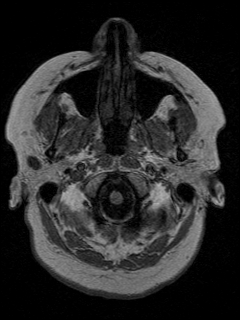
[im 39/176]
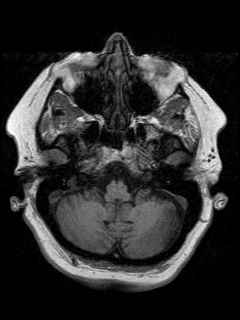
[im 59/176]
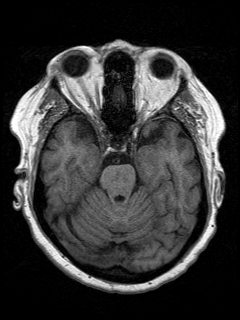
[im 78/176]
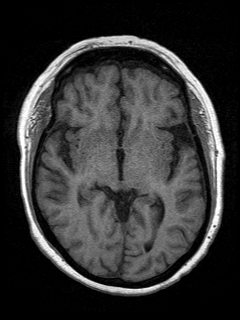
[im 98/176]
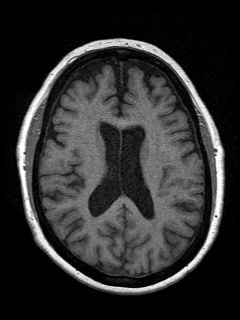
[im 117/176]
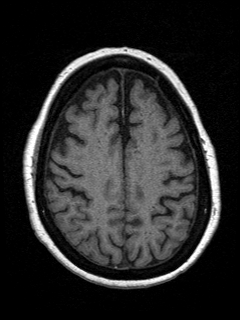
[im 137/176]
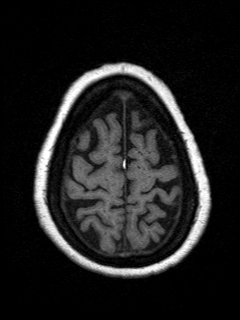
[im 156/176]
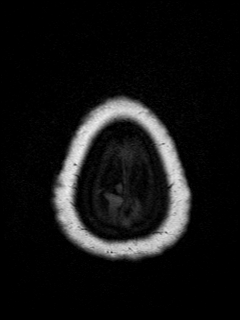
[im 176/176]
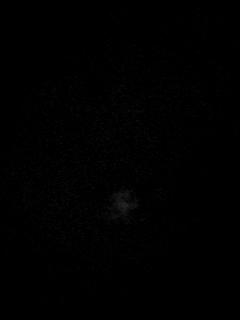

[Series 12: T2 · coronal · 5.0mm · 0.43mm/px · 2 of 32 slices shown (2 of 2)]
[im 1/32]
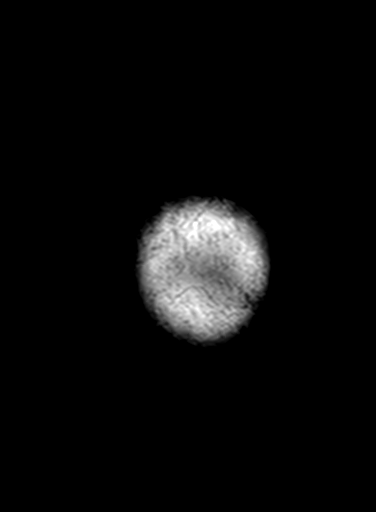
[im 32/32]
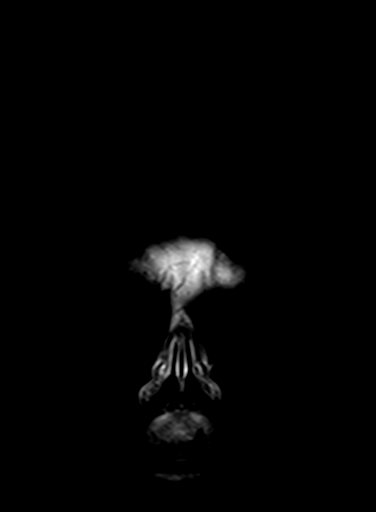

[Series 13: post t1_mpr_tra · axial · 1.0mm · 0.75mm/px · z∈[-43,+128]mm · 10 of 176 slices shown]
[im 1/176]
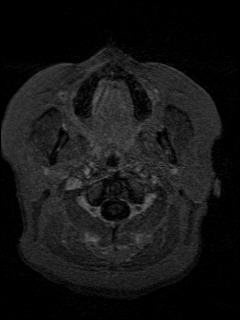
[im 20/176]
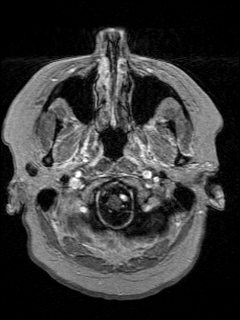
[im 39/176]
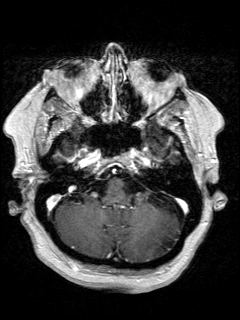
[im 59/176]
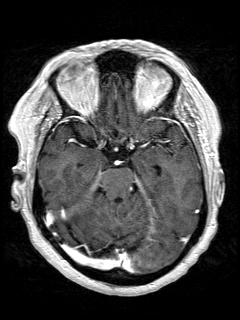
[im 78/176]
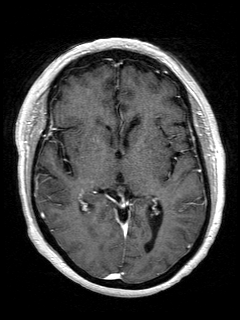
[im 98/176]
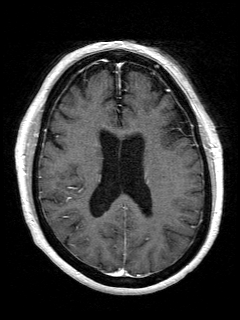
[im 117/176]
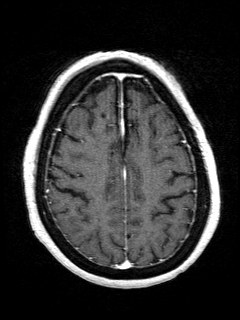
[im 137/176]
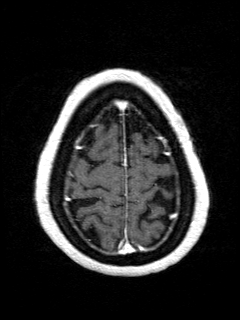
[im 156/176]
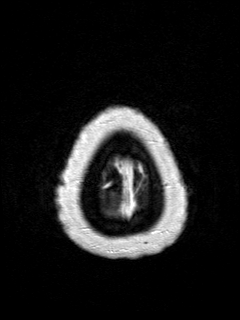
[im 176/176]
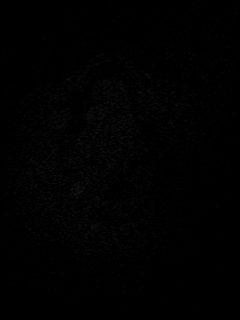

[Series 14: T1 post-contrast · coronal · 5.0mm · 0.72mm/px · 2 of 32 slices shown]
[im 1/32]
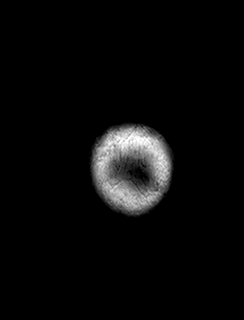
[im 32/32]
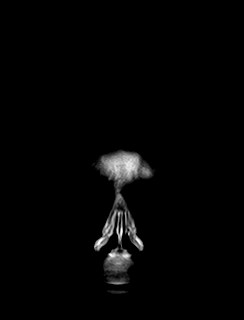

[48 of 48 positions shown; findings below may reference images not displayed]

FINDINGS: Brain:

Mild intermittent motion degradation.

Minimal generalized cerebral and cerebellar atrophy, not unexpected
for age.

Tiny chronic cortical infarct within the right frontal operculum
(series 10, image 23).

Mild multifocal T2 FLAIR hyperintense signal abnormality within the
cerebral white matter and pons, nonspecific but compatible with
chronic small vessel ischemic disease.

Partially empty sella turcica.

There is no acute infarct.

No evidence of an intracranial mass.

No chronic intracranial blood products.

No extra-axial fluid collection.

No midline shift.

No pathologic intracranial enhancement identified.

Vascular: Maintained flow voids within the proximal large arterial
vessels.

Skull and upper cervical spine: No focal suspicious marrow lesion.

Sinuses/Orbits: Visualized orbits show no acute finding. Bilateral
ocular lens replacements. Trace mucosal thickening within the
bilateral ethmoid sinuses.

Other: 15 mm ovoid T2 hyperintense lesion within the right parotid
gland, highly suspicious for a primary parotid neoplasm.
IMPRESSION: Mildly motion degraded exam.

No evidence of acute intracranial abnormality.

Mild chronic small vessel ischemic changes within the cerebral white
matter and pons, new from the brain MRI of 11/22/2012.

Tiny chronic cortical infarct within the right frontal operculum,
also new from the prior MRI.

Minimal generalized cerebral and cerebellar atrophy, not unexpected
for age.

15 mm T2 hyperintense lesion within the right parotid gland, highly
suspicious for a primary parotid neoplasm. ENT consultation
recommended.

## 2024-01-21 ENCOUNTER — Other Ambulatory Visit: Payer: Self-pay | Admitting: Cardiovascular Disease

## 2024-01-21 ENCOUNTER — Other Ambulatory Visit (HOSPITAL_COMMUNITY): Payer: Self-pay

## 2024-01-21 DIAGNOSIS — I480 Paroxysmal atrial fibrillation: Secondary | ICD-10-CM

## 2024-01-21 MED ORDER — METOPROLOL TARTRATE 25 MG PO TABS
25.0000 mg | ORAL_TABLET | Freq: Two times a day (BID) | ORAL | 0 refills | Status: AC
  Filled 2024-01-21 – 2024-07-01 (×3): qty 180, 90d supply, fill #0

## 2024-01-21 MED ORDER — DILTIAZEM HCL ER COATED BEADS 180 MG PO CP24
ORAL_CAPSULE | ORAL | 0 refills | Status: DC
  Filled 2024-01-21: qty 90, 90d supply, fill #0

## 2024-01-21 MED ORDER — WARFARIN SODIUM 5 MG PO TABS
ORAL_TABLET | ORAL | 0 refills | Status: DC
  Filled 2024-01-21: qty 75, 75d supply, fill #0
  Filled 2024-04-14 – 2024-07-10 (×3): qty 75, fill #0

## 2024-01-21 NOTE — Telephone Encounter (Signed)
*  STAT* If patient is at the pharmacy, call can be transferred to refill team.   1. Which medications need to be refilled? (please list name of each medication and dose if known) metoprolol  tartrate (LOPRESSOR ) 25 MG tablet warfarin (COUMADIN ) 5 MG tablet diltiazem  (CARDIZEM  CD) 180 MG 24 hr capsule   2. Would you like to learn more about the convenience, safety, & potential cost savings by using the [facility]? No     3. Are you open to using the [facility] (Type [facility]. ). No   4. Which pharmacy/location (including street and city if local pharmacy) is medication to be sent to? [id number] - [place], [place] - [address]    5. Do they need a 30 day or 90 day supply? 90 day

## 2024-01-22 ENCOUNTER — Other Ambulatory Visit: Payer: Self-pay

## 2024-01-22 DIAGNOSIS — E1165 Type 2 diabetes mellitus with hyperglycemia: Secondary | ICD-10-CM

## 2024-01-22 MED ORDER — GLIPIZIDE ER 5 MG PO TB24
5.0000 mg | ORAL_TABLET | Freq: Every day | ORAL | 1 refills | Status: DC
Start: 2024-01-22 — End: 2024-04-14

## 2024-01-22 MED ORDER — METFORMIN HCL 1000 MG PO TABS: 1000.0000 mg | ORAL_TABLET | Freq: Two times a day (BID) | ORAL | 0 refills | Status: DC

## 2024-01-23 ENCOUNTER — Ambulatory Visit (INDEPENDENT_AMBULATORY_CARE_PROVIDER_SITE_OTHER): Payer: Self-pay

## 2024-01-23 ENCOUNTER — Telehealth: Payer: Self-pay

## 2024-01-23 DIAGNOSIS — I480 Paroxysmal atrial fibrillation: Secondary | ICD-10-CM

## 2024-01-23 DIAGNOSIS — Z7901 Long term (current) use of anticoagulants: Secondary | ICD-10-CM

## 2024-01-23 LAB — POCT INR: INR: 3.6 — AB (ref 2.0–3.0)

## 2024-01-23 NOTE — Telephone Encounter (Signed)
 INR due. Called pt, no answer. Left message on voicemail.

## 2024-01-23 NOTE — Patient Instructions (Signed)
 Description   Spoke with patient and instructed to HOLD today's dosage and then start taking Warfarin 1/2 tablet (2.5mg ) daily except 1 tablet (5mg ) on Mondays and Fridays.   Stay consistent with greens (3 per week). Recheck INR in 1 week  Anticoagulation Clinic [phone]

## 2024-01-25 DIAGNOSIS — I10 Essential (primary) hypertension: Secondary | ICD-10-CM | POA: Diagnosis not present

## 2024-01-25 DIAGNOSIS — I350 Nonrheumatic aortic (valve) stenosis: Secondary | ICD-10-CM | POA: Diagnosis not present

## 2024-01-25 DIAGNOSIS — E1169 Type 2 diabetes mellitus with other specified complication: Secondary | ICD-10-CM | POA: Diagnosis not present

## 2024-01-25 DIAGNOSIS — I4821 Permanent atrial fibrillation: Secondary | ICD-10-CM | POA: Diagnosis not present

## 2024-01-30 DIAGNOSIS — I4821 Permanent atrial fibrillation: Secondary | ICD-10-CM | POA: Diagnosis not present

## 2024-01-30 DIAGNOSIS — I350 Nonrheumatic aortic (valve) stenosis: Secondary | ICD-10-CM | POA: Diagnosis not present

## 2024-01-30 DIAGNOSIS — I10 Essential (primary) hypertension: Secondary | ICD-10-CM | POA: Diagnosis not present

## 2024-01-30 DIAGNOSIS — I480 Paroxysmal atrial fibrillation: Secondary | ICD-10-CM | POA: Diagnosis not present

## 2024-01-30 DIAGNOSIS — E1169 Type 2 diabetes mellitus with other specified complication: Secondary | ICD-10-CM | POA: Diagnosis not present

## 2024-01-31 ENCOUNTER — Other Ambulatory Visit (HOSPITAL_COMMUNITY): Payer: Self-pay

## 2024-01-31 DIAGNOSIS — M5416 Radiculopathy, lumbar region: Secondary | ICD-10-CM | POA: Diagnosis not present

## 2024-02-05 DIAGNOSIS — F410 Panic disorder [episodic paroxysmal anxiety] without agoraphobia: Secondary | ICD-10-CM | POA: Diagnosis not present

## 2024-02-05 DIAGNOSIS — G4733 Obstructive sleep apnea (adult) (pediatric): Secondary | ICD-10-CM | POA: Diagnosis not present

## 2024-02-05 DIAGNOSIS — F5101 Primary insomnia: Secondary | ICD-10-CM | POA: Diagnosis not present

## 2024-02-05 DIAGNOSIS — I10 Essential (primary) hypertension: Secondary | ICD-10-CM | POA: Diagnosis not present

## 2024-02-05 DIAGNOSIS — E7800 Pure hypercholesterolemia, unspecified: Secondary | ICD-10-CM | POA: Diagnosis not present

## 2024-02-05 DIAGNOSIS — F320 Major depressive disorder, single episode, mild: Secondary | ICD-10-CM | POA: Diagnosis not present

## 2024-02-06 ENCOUNTER — Encounter: Payer: Self-pay | Admitting: Cardiovascular Disease

## 2024-02-06 ENCOUNTER — Ambulatory Visit: Payer: Medicare Other | Attending: Cardiovascular Disease | Admitting: Cardiovascular Disease

## 2024-02-06 DIAGNOSIS — Z7901 Long term (current) use of anticoagulants: Secondary | ICD-10-CM | POA: Insufficient documentation

## 2024-02-06 DIAGNOSIS — I350 Nonrheumatic aortic (valve) stenosis: Secondary | ICD-10-CM | POA: Insufficient documentation

## 2024-02-06 DIAGNOSIS — Z6841 Body Mass Index (BMI) 40.0 and over, adult: Secondary | ICD-10-CM | POA: Diagnosis not present

## 2024-02-06 DIAGNOSIS — G4733 Obstructive sleep apnea (adult) (pediatric): Secondary | ICD-10-CM | POA: Insufficient documentation

## 2024-02-06 DIAGNOSIS — I4811 Longstanding persistent atrial fibrillation: Secondary | ICD-10-CM | POA: Insufficient documentation

## 2024-02-06 DIAGNOSIS — E119 Type 2 diabetes mellitus without complications: Secondary | ICD-10-CM | POA: Insufficient documentation

## 2024-02-06 DIAGNOSIS — E785 Hyperlipidemia, unspecified: Secondary | ICD-10-CM | POA: Insufficient documentation

## 2024-02-06 DIAGNOSIS — I10 Essential (primary) hypertension: Secondary | ICD-10-CM | POA: Diagnosis not present

## 2024-02-06 NOTE — Patient Instructions (Signed)
 Medication Instructions:   No changes   *If you need a refill on your cardiac medications before your next appointment, please call your pharmacy*   Lab Work: Not needed    Testing/Procedures: Echo in March 2026 Your physician has requested that you have an echocardiogram. Echocardiography is a painless test that uses sound waves to create images of your heart. It provides your doctor with information about the size and shape of your heart and how well your heart's chambers and valves are working. This procedure takes approximately one hour. There are no restrictions for this procedure. Please do NOT wear cologne, perfume, aftershave, or lotions (deodorant is allowed). Please arrive 15 minutes prior to your appointment time.  Please note: We ask at that you not bring children with you during ultrasound (echo/ vascular) testing. Due to room size and safety concerns, children are not allowed in the ultrasound rooms during exams. Our front office staff cannot provide observation of children in our lobby area while testing is being conducted. An adult accompanying a patient to their appointment will only be allowed in the ultrasound room at the discretion of the ultrasound technician under special circumstances. We apologize for any inconvenience.   Follow-Up: At [facility], you and your health needs are our priority.  As part of our continuing mission to provide you with exceptional heart care, we have created designated Provider Care Teams.  These Care Teams include your primary Cardiologist (physician) and Advanced Practice Providers (APPs -  Physician Assistants and Nurse Practitioners) who all work together to provide you with the care you need, when you need it.     Your next appointment:   11 month(s)  The format for your next appointment:   In Person  Provider:   Dr Carson Clara

## 2024-02-06 NOTE — Progress Notes (Signed)
 Cardiology Office Note    Date:  02/06/2024   ID:  Debra Wright, DOB Sep 24, 1951, MRN [id number]  PCP:  Ransom Byers, MD  Cardiologist:  Debra Schuller, MD   11 month f/u cardiology office visit   History of Present Illness:  Debra Wright is a 73 y.o. female who was seen by me in the hospital in August 2019 when she presented with new onset atrial fibrillation and reverted to sinus rhythm in the emergency room after multiple rounds of IV metoprolol .  She has a history of hypertension.  She had mild chest tightness associated with a rapid rate, troponins were negative and she had nonspecific ST-T changes.    The patient was evaluated by Debra Magnuson, PA-C on January 27, 2019 in a telemedicine conference.  She has a history of hypertension, type 2 diabetes mellitus, hyperlipidemia, anxiety, and has sleep apnea which is followed by Dr. Villa Wright.  During her 2019 hospitalization, she converted to sinus rhythm with IV metoprolol  and oral metoprolol  was added to her regimen.  Amlodipine  was changed to diltiazem .  An echo Doppler in the hospital demonstrated normal LV function with moderate LVH, grade 2 diastolic dysfunction, aortic valve sclerosis without stenosis, and mild to moderate left atrial enlargement.  Subsequently, her metoprolol  dose had been reduced due to fatigability.  She had undergone total knee replacement in March 2020 completed physical therapy.  I saw her on September 09, 2019 and denied any awareness of recurrent AF.  Since her knee surgery she had a 20 pound weight loss but subsequently admits that she has regained approximately 12 pounds back.  She has had recent blood pressure lability.  She continues to use CPAP.    She was evaluated in a telemedicine evaluation on March 05, 2020 and remained stable and was unaware of any recurrence of her atrial fibrillation.  He continues to be on long-term anticoagulation therapy with warfarin.  She was continuing to use CPAP followed by  Dr. Villa Wright.  I had not seen her since her telemedicine visit in 2021. She underwent a 2D echo Doppler study on December 22, 2021 which showed hyperdynamic LV function with EF 65 to 70% with moderate concentric LVH and grade 2 diastolic dysfunction.  Pulmonary pressures were normal.  Left atrial size is moderately dilated.  There was a small circumferential pericardial effusion.  There was moderate mitral annular calcification.  She had moderate calcification of his aortic valve and was felt to have mild aortic valve stenosis with a mean gradient of 8 and peak gradient of 14.4 mmHg.   I saw her on November 13, 2022.  She has continued to be followed by Dr. Hubert Wright for her diabetes mellitus and sees Dr. Villa Wright for her CPAP therapy/OSA.  She checks her warfarin levels at home and then calls our Coumadin  clinic.  Her last laboratory on January 30 was increased at 3.1 and she was advised to reduce her warfarin and take 5 mg daily with the exception of 2.5 mg on Wednesday and Sundays.  Sees Debra Lias, PA at [facility] for primary care.  Lipid studies on September 08, 2022 showed total cholesterol 137 HDL 44 LDL 60 and triglycerides were elevated at 202.  She admits to easy bruisability.  She continues to be on diltiazem  CD180 mg spironolactone  12.5 mg and metoprolol  titrate 25 mg twice a day for blood pressure and heart rate control.  She is diabetic on Jardiance , metformin .  She is on  rosuvastatin  20 mg and omega-3 fatty acid 1200 mg twice a day for mixed hyperlipidemia.  She denies chest pain or shortness of breath.  She cares for her disabled husband.  During that evaluation, I recommended that she undergo a follow-up echo Doppler study for reassessment of her aortic stenosis.  Debra Wright underwent a 1 year follow-up echo Doppler study on December 08, 2022.  This revealed normal LV function with EF 60 to 65% without wall motion abnormalities.  She had moderate LVH.  There was slightly reduced RV systolic function with mildly enlarged  RV ventricular size.  There was moderate mitral annular calcification with trivial MR.  There was moderate aortic valve calcification with mild aortic stenosis with a mean gradient of 7.4 and peak gradient of 12.8 mmHg.  Ascending aorta was mildly dilated at 41 mm.  I last saw her in March 23, 2023.  She was in atrial fibrillation felt to be longstanding with rate control.  She felt well.  She has purposely lost 10 pounds since March predominantly with improvement in her diet.  She has had both knees replaced and does not exercise.  She is on diltiazem  180 mg, metoprolol  tartrate 25 mg twice a day, spironolactone  12.5 mg daily for hypertension.  She is on Jardiance  25 mg glimepiride  4 mg and Ozempic  as well as metformin  for her diabetes mellitus.  She continues to be on warfarin.    Since I saw her, she has undergone right open carpal tunnel release surgery with Dr. Marce Wright.  She is now followed by Dr. Denna Wright at [place] for primary care following the departure of Debra Wright, [place].  She continues to use CPAP followed by Dr. Villa Wright.  She denies any chest pain or shortness of breath.  She underwent a follow-up echo Doppler study on December 04, 2023 which showed EF 60 to 65% without wall motion abnormalities and moderate left ventricular hypertrophy.  There was mild LA dilation.  She had mild mitral regurgitation and moderate mitral annular calcification.  There was mild aortic valve stenosis with a mean gradient of 9, peak gradient 17.8 mmHg.  She denies any presyncope.  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  She presents for evaluation.   Past Medical History:  Diagnosis Date   Anxiety    Arthritis    "knees; right thumb" (10/14/2014)   Basal cell carcinoma    "burned off upper lip & right shoulder"   Chronic anticoagulation    CHADS VASC=3   Complication of anesthesia    after spinal anesthesia for left knee  replacement, bladder did "not wake up". Was incontinent for 4 days post surgery   Depression    Fibroid tumor    Frequent diarrhea    GERD (gastroesophageal reflux disease)    High cholesterol    Hypertension    EF 65-70% grade 2DD   Insomnia    Migraine    "frequently when I was younger; maybe monthly now" (10/14/2014) - none after hysterectomy   PAF (paroxysmal atrial fibrillation) (HCC) 05/02/2018   converted with rate control   Pernicious anemia    pt not aware of this   Sleep apnea    uses a cpap   Type II diabetes mellitus (HCC)    Vitamin B 12 deficiency     Past Surgical History:  Procedure Laterality Date   ABDOMINAL HYSTERECTOMY  1988   ACHILLES  TENDON SURGERY Right    APPENDECTOMY  1988   BILATERAL OOPHORECTOMY  2004   CARPAL TUNNEL RELEASE Bilateral 1986   CARPAL TUNNEL RELEASE Right 11/09/2023   Procedure: RIGHT CARPAL TUNNEL RELEASE;  Surgeon: Merrill Abide, MD;  Location: [facility];  Service: Orthopedics;  Laterality: Right;   CATARACT EXTRACTION W/ INTRAOCULAR LENS  IMPLANT, BILATERAL Bilateral 2011   COLONOSCOPY     HYPOTHENAR FAT PAD TRANSFER Right 11/09/2023   Procedure: RIGHT HYPOTHENAR FAT PAD TRANSFER;  Surgeon: Merrill Abide, MD;  Location: [facility];  Service: Orthopedics;  Laterality: Right;   JOINT REPLACEMENT     SHOULDER ARTHROSCOPY DISTAL CLAVICLE EXCISION AND OPEN ROTATOR CUFF REPAIR Right 11/2013   TOENAIL EXCISION Right 04/2018   "big toe"   TONSILLECTOMY  1950's   TOTAL KNEE ARTHROPLASTY Left 10/13/2014   Procedure: LEFT TOTAL KNEE ARTHROPLASTY;  Surgeon: Arnie Lao, MD;  Location: [facility] OR;  Service: Orthopedics;  Laterality: Left;   TOTAL KNEE ARTHROPLASTY Right 12/10/2018   Procedure: RIGHT TOTAL KNEE ARTHROPLASTY;  Surgeon: Arnie Lao, MD;  Location: [facility] OR;  Service: Orthopedics;  Laterality: Right;    Current Medications: Outpatient Medications Prior to Visit  Medication Sig  Dispense Refill   Blood Glucose Monitoring Suppl (ACCU-CHEK GUIDE ME) w/Device KIT USE AS DIRECTED.  Dx E11.9. 1 kit 11   calcium  carbonate (TUMS - DOSED IN MG ELEMENTAL CALCIUM ) 500 MG chewable tablet Chew 2 tablets by mouth daily as needed for indigestion or heartburn.     Calcium  Carbonate-Vitamin D (CALCIUM  600 + D PO) Take 1 tablet by mouth daily.     cetirizine (ZYRTEC) 10 MG tablet Take 10 mg by mouth daily as needed for allergies.     CINNAMON PO Take 2 tablets by mouth daily.     clonazePAM  (KLONOPIN ) 0.5 MG tablet TAKE 1/2 TO 1 (ONE-HALF TO ONE) TABLET BY MOUTH AT BEDTIME AS NEEDED (Patient taking differently: Take 0.25-0.5 mg by mouth daily as needed for anxiety.) 30 tablet 0   Continuous Glucose Sensor (DEXCOM G7 SENSOR) MISC 1 Device by Does not apply route continuous. 9 each 1   Cyanocobalamin  (VITAMIN B12) 1000 MCG TBCR Take 1,000 mcg by mouth daily.     diltiazem  (CARDIZEM  CD) 180 MG 24 hr capsule TAKE ONE CAPSULE BY MOUTH ONCE A DAY 90 capsule 0   diltiazem  (TIAZAC ) 180 MG 24 hr capsule Take 180 mg by mouth daily.     empagliflozin  (JARDIANCE ) 25 MG TABS tablet Take 1 tablet (25 mg total) by mouth daily before breakfast. 90 tablet 0   escitalopram (LEXAPRO) 10 MG tablet 1/2 tablet once a day x 1 week, then increase to 1 tablet daily     Eszopiclone  3 MG TABS Take 1 tablet (3 mg total) by mouth at bedtime as needed. Take immediately before bedtime 30 tablet 3   Ferrous Sulfate (IRON) 325 (65 Fe) MG TABS Take 325 mg by mouth 2 (two) times daily.     gabapentin  (NEURONTIN ) 100 MG capsule Take 1 capsule (100 mg total) by mouth daily. 90 capsule 0   glipiZIDE  (GLUCOTROL  XL) 5 MG 24 hr tablet Take 1 tablet (5 mg total) by mouth daily with breakfast. Hold if BG <80 90 tablet 1   glucose blood (ACCU-CHEK GUIDE) test strip CHECK BLOOD GLUCOSE IN THE MORNING AND AT BEDTIME.  E11.9 code. 100 strip 12   magnesium  oxide (MAG-OX) 400 (241.3 Mg) MG tablet Take 400 mg by mouth daily.  metFORMIN  (GLUCOPHAGE ) 1000 MG tablet Take 1 tablet (1,000 mg total) by mouth 2 (two) times daily with a meal. 180 tablet 0   metoprolol  tartrate (LOPRESSOR ) 25 MG tablet Take 1 tablet (25 mg total) by mouth 2 (two) times daily. 180 tablet 0   Omega-3 Fatty Acids (Wright OIL) 1200 MG CAPS Take 1,200 mg by mouth 2 (two) times daily.     omeprazole  (PRILOSEC) 40 MG capsule Take 1 capsule by mouth once daily 90 capsule 0   oxybutynin (DITROPAN) 5 MG tablet Take 5 mg by mouth 2 (two) times daily.     Semaglutide ,0.25 or 0.5MG /DOS, 2 MG/3ML SOPN Inject 0.25 mg into the skin once a week. 3 mL 2   spironolactone  (ALDACTONE ) 25 MG tablet TAKE HALF TABLET BY MOUTH DAILY 45 tablet 0   tolterodine  (DETROL  LA) 4 MG 24 hr capsule Take 4 mg by mouth daily.     vitamin C (ASCORBIC ACID) 250 MG tablet Take 250 mg by mouth daily.     warfarin (COUMADIN ) 5 MG tablet TAKE ONE-HALF TO ONE TABLET BY MOUTH DAILY AS DIRECTED BY COUMADIN  CLINIC 75 tablet 0   rosuvastatin  (CRESTOR ) 20 MG tablet Take 1 tablet (20 mg total) by mouth daily. 90 tablet 1   oxyCODONE  (ROXICODONE ) 5 MG immediate release tablet Take 1 tablet (5 mg total) by mouth every 6 (six) hours as needed. 20 tablet 0   No facility-administered medications prior to visit.     Allergies:   Antivert  [meclizine  hcl], Meclizine , Trazodone hcl, Zolpidem , and Zolpidem  tartrate   Social History   Socioeconomic History   Marital status: Married    Spouse name: Not on file   Number of children: 1   Years of education: 14   Highest education level: Not on file  Occupational History   Not on file  Tobacco Use   Smoking status: Never   Smokeless tobacco: Never  Vaping Use   Vaping status: Never Used  Substance and Sexual Activity   Alcohol use: Yes    Comment: 10/14/2014 "might have a drink when I'm out once/month"   Drug use: No   Sexual activity: Not Currently    Birth control/protection: Surgical    Comment: hyst  Other Topics Concern   Not on file   Social History Narrative   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   Married       Right handed    Drinks caffeine one cup of coffee daily   An apartment    Social Drivers of Corporate investment banker Strain: Not on Ship broker Insecurity: Not on file  Transportation Needs: Not on file  Physical Activity: Not on file  Stress: Not on file  Social Connections: Not on file     Family History:  The patient's family history includes Arthritis in her father, maternal grandfather, maternal grandmother, mother, paternal grandfather, and paternal grandmother; Diabetes in her brother, father, maternal grandmother, mother, paternal grandfather, and paternal grandmother; Heart disease in her father; Hyperlipidemia in her father, maternal grandfather, maternal grandmother, mother, paternal grandfather, and paternal grandmother; Hypertension in her brother, maternal grandfather, maternal grandmother, mother, paternal grandfather, and paternal grandmother; Stroke in her brother.   ROS General: Negative; No fevers, chills, or night sweats;  HEENT: Negative; No changes in vision or hearing, sinus congestion, difficulty swallowing Pulmonary: Negative; No cough, wheezing, shortness of breath, hemoptysis Cardiovascular: HPI GI: Negative; No nausea, vomiting, diarrhea, or abdominal pain GU: Negative;  No dysuria, hematuria, or difficulty voiding Musculoskeletal: Right total knee replacement Hematologic/Oncology: Negative; no easy bruising, bleeding Endocrine: diabetes mellitus Neuro: Negative; no changes in balance, headaches Skin: Negative; No rashes or skin lesions Psychiatric: ,depression Sleep: OSA on CPAP followed by Dr. Villa Wright  Other comprehensive 14 point system review is negative.   PHYSICAL EXAM:   VS:  BP 126/78   Pulse 74   Ht 5\' 1"  (1.549 m)   Wt 220 lb 6.4 oz (100 kg)   SpO2 97%   BMI 41.64 kg/m     Repeat blood pressure by me 124/76  Wt Readings from Last 3 Encounters:   02/06/24 220 lb 6.4 oz (100 kg)  12/12/23 230 lb 3.2 oz (104.4 kg)  12/11/23 234 lb (106.1 kg)    General: Alert, oriented, no distress.  Morbid obesity Skin: normal turgor, no rashes, warm and dry HEENT: Normocephalic, atraumatic. Pupils equal round and reactive to light; sclera anicteric; extraocular muscles intact;  Nose without nasal septal hypertrophy Mouth/Parynx benign; Mallinpatti scale 3/4 Neck: No JVD, no carotid bruits; normal carotid upstroke Lungs: clear to ausculatation and percussion; no wheezing or rales Chest wall: without tenderness to palpitation Heart: PMI not displaced, irregular irregular consistent with her longstanding atrial fibrillation, s1 s2 normal, 1/6 systolic murmur, no diastolic murmur, no rubs, gallops, thrills, or heaves Abdomen: soft, nontender; no hepatosplenomehaly, BS+; abdominal aorta nontender and not dilated by palpation. Back: no CVA tenderness Pulses 2+ Musculoskeletal: full range of motion, normal strength, no joint deformities Extremities: no clubbing cyanosis or edema, Homan's sign negative  Neurologic: grossly nonfocal; Cranial nerves grossly wnl Psychologic: Normal mood and affect    Studies/Labs Reviewed:   EKG Interpretation Date/Time:  Wednesday Feb 06 2024 13:44:13 EDT Ventricular Rate:  74 PR Interval:    QRS Duration:  86 QT Interval:  370 QTC Calculation: 410 R Axis:   -29  Text Interpretation: Atrial fibrillation Moderate voltage criteria for LVH, may be normal variant ( R in aVL , Cornell product ) When compared with ECG of 23-Mar-2023 14:59, No significant change was found Confirmed by Debra Wright ([id number]) on 02/06/2024 2:01:18 PM    March 23, 2023 ECG (independently read by me): Atrial fibrillation with ventricular rate of 64.  Left axis deviation.  Moderate voltage criteria for LVH.  November 13, 2022 ECG (independently read by me): Atrial fibrillation at 54 bpm.  LVH by voltage.  Normal intervals.  No ectopy  Recent  Labs:    Latest Ref Rng & Units 11/08/2023    1:30 PM 05/07/2023    2:32 PM 02/01/2023    2:23 PM  BMP  Glucose 70 - 99 mg/dL 202  542  706   BUN 8 - 23 mg/dL 18  15  14    Creatinine 0.44 - 1.00 mg/dL 2.37  6.28  3.15   Sodium 135 - 145 mmol/L 133  137  140   Potassium 3.5 - 5.1 mmol/L 4.9  4.1  4.1   Chloride 98 - 111 mmol/L 100  104  102   CO2 22 - 32 mmol/L 22  23  28    Calcium  8.9 - 10.3 mg/dL 8.9  8.7  9.2         Latest Ref Rng & Units 05/07/2023    2:32 PM 07/12/2022    7:06 PM 05/11/2022   12:43 PM  Hepatic Function  Total Protein 6.0 - 8.3 g/dL 6.1  6.3  6.6   Albumin 3.5 - 5.2 g/dL 3.6  3.3  4.0   AST 0 - 37 U/L 22  23  21    ALT 0 - 35 U/L 22  15  25    Alk Phosphatase 39 - 117 U/L 62  52  65   Total Bilirubin 0.2 - 1.2 mg/dL 0.3  0.6  0.3        Latest Ref Rng & Units 07/12/2022    6:03 PM 09/29/2021    2:17 PM 09/29/2021   12:54 PM  CBC  WBC 4.0 - 10.5 K/uL 8.8   9.8   Hemoglobin 12.0 - 15.0 g/dL 13.0  86.5  78.4   Hematocrit 36.0 - 46.0 % 44.6  37.0  41.4   Platelets 150 - 400 K/uL 264   313    Lab Results  Component Value Date   MCV 90.7 07/12/2022   MCV 91.4 09/29/2021   MCV 89.6 11/26/2019   Lab Results  Component Value Date   TSH 2.14 03/04/2019   Lab Results  Component Value Date   HGBA1C 6.9 (A) 10/29/2023     BNP    Component Value Date/Time   BNP 110.6 (H) 05/03/2018 0153    ProBNP No results found for: "PROBNP"   Lipid Panel     Component Value Date/Time   CHOL 122 05/07/2023 1432   TRIG 245.0 (H) 05/07/2023 1432   HDL 37.20 (L) 05/07/2023 1432   CHOLHDL 3 05/07/2023 1432   VLDL 49.0 (H) 05/07/2023 1432   LDLCALC 66 08/07/2018 1530   LDLDIRECT 67.0 05/07/2023 1432     RADIOLOGY: No results found.   Additional studies/ records that were reviewed today include:  Reviewed the patient's hospitalization from August 2019.  ------------------------------------------------------------------- ECHO 05/03/2018 Study  Conclusions   - Left ventricle: The cavity size was normal. There was moderate   concentric hypertrophy. Systolic function was vigorous. The   estimated ejection fraction was in the range of 65% to 70%. Wall   motion was normal; there were no regional wall motion   abnormalities. Features are consistent with a pseudonormal left   ventricular filling pattern, with concomitant abnormal relaxation   and increased filling pressure (grade 2 diastolic dysfunction).   Doppler parameters are consistent with high ventricular filling   pressure. - Aortic valve: Trileaflet; mildly thickened, mildly calcified   leaflets. - Aorta: Aortic root dimension: 37 mm (ED). - Mitral valve: Calcified annulus. - Left atrium: The atrium was mildly to moderately dilated. - Pulmonary arteries: Systolic pressure could not be accurately   estimated.    ECHO: 12/22/2021 1. Left ventricular ejection fraction, by estimation, is 65 to 70%. The  left ventricle has normal function. The left ventricle has no regional  wall motion abnormalities. There is moderate concentric left ventricular  hypertrophy. Left ventricular  diastolic parameters are consistent with Grade II diastolic dysfunction  (pseudonormalization).   2. Right ventricular systolic function is normal. The right ventricular  size is mildly enlarged. There is normal pulmonary artery systolic  pressure. The estimated right ventricular systolic pressure is 16.6 mmHg.   3. Left atrial size was moderately dilated.   4. A small pericardial effusion is present. The pericardial effusion is  circumferential. There is no evidence of cardiac tamponade.   5. The mitral valve is grossly normal. Trivial mitral valve  regurgitation. No evidence of mitral stenosis. Moderate mitral annular  calcification.   6. The aortic valve was not well visualized. There is moderate  calcification of the aortic valve. There is moderate thickening of the  aortic  valve. Aortic valve  regurgitation is not visualized. Mild aortic  valve stenosis.   7. Aortic dilatation noted. Aneurysm of the ascending aorta, measuring 40  mm.   8. The inferior vena cava is dilated in size with >50% respiratory  variability, suggesting right atrial pressure of 8 mmHg.   Comparison(s): Changes from prior study are noted. EF 65%, moderate LVH,  AOR 37mm.   Conclusion(s)/Recommendation(s): Ascending aorta measures 40 mm on current  study, otherwise no significant changes.    ECHO: 12/04/2023  1. Left ventricular ejection fraction, by estimation, is 60 to 65%. The  left ventricle has normal function. The left ventricle has no regional  wall motion abnormalities. There is moderate left ventricular hypertrophy.  Left ventricular diastolic  parameters are indeterminate.   2. Right ventricular systolic function is normal. The right ventricular  size is normal. Tricuspid regurgitation signal is inadequate for assessing  PA pressure.   3. Left atrial size was mildly dilated.   4. The mitral valve is normal in structure. Mild mitral valve  regurgitation. No evidence of mitral stenosis. Moderate mitral annular  calcification.   5. The aortic valve is calcified. Aortic valve regurgitation is trivial.  Mild aortic valve stenosis.   6. The inferior vena cava is normal in size with greater than 50%  respiratory variability, suggesting right atrial pressure of 3 mmHg.   ASSESSMENT:    1. Longstanding persistent atrial fibrillation (HCC)   2. Long term (current) use of anticoagulants   3. Essential hypertension   4. Mild aortic stenosis   5. OSA (obstructive sleep apnea)   6. Controlled type 2 diabetes mellitus without complication, without long-term current use of insulin  (HCC)   7. Hyperlipidemia LDL goal <70   8. Morbid obesity with BMI of 40.0-44.9, adult [facility])     PLAN:  Debra Wright is a 73 year-old female who has a history of diabetes mellitus since age 63 as well as a  history of hypertension, hyperlipidemia, obstructive sleep apnea and anxiety. She developed an new onset episode of atrial fibrillation with RVR leading to hospitalization in August 2019 at which time she converted successfully with IV metoprolol  to sinus rhythm. At her evaluation with me in December 2020 and her telemedicine visit in June 2021 she was maintaining sinus rhythm.  Has had issues with blood pressure and she currently is on a regimen of diltiazem  CD1 80 mg, metoprolol  tartrate 25 mg twice a day and spironolactone  12.5 mg daily.  When I saw her in February 2024 her blood pressure was stable.  She was now in atrial fibrillation with controlled ventricular rate in the 60s.  She has been maintained on chronic warfarin therapy.  A repeat echo Doppler study on December 08, 2022 showed  normal LV contractility.  EF 60-65%.  She has moderate LVH with slightly reduced RV systolic function and mild RV enlargement.  She does have moderate mitral annular calcification with trivial MR as well as moderate aortic valve calcification and is now felt to have mild aortic stenosis with a mean gradient of 7.4 and peak gradient of 12.8 mmHg.  Ascending aorta was mildly dilated at 41 mm.  Her most recent echo Doppler study from December 04, 2023 showed EF 60 to 65% with moderate LVH, mild MR with moderate mitral annular calcification and calcified aortic valve with mild aortic stenosis (mean gradient 9.0, peak gradient 17.8 although AVA estimate 1.24 cm).  She continues to use CPAP therapy followed by Dr.  Villa Wright.  She is on rosuvastatin  20 mg for hyperlipidemia and is on Jardiance  glipizide , metformin , and semaglutide  for her diabetes mellitus.  Blood pressure today is controlled on diltiazem  CD180, metoprolol  tartrate 25 mg twice a day, and spironolactone  12.5 mg daily.  I discussed with her my plans for upcoming retirement.  I will transition her to the care of Dr. Carson Clara to be seen in 1 year and prior to that  evaluation she will have a follow-up echo Doppler assessment.    Medication Adjustments/Labs and Tests Ordered: Current medicines are reviewed at length with the patient today.  Concerns regarding medicines are outlined above.  Medication changes, Labs and Tests ordered today are listed in the Patient Instructions below. Patient Instructions  Medication Instructions:   No changes   *If you need a refill on your cardiac medications before your next appointment, please call your pharmacy*   Lab Work: Not needed    Testing/Procedures: Echo in March 2026 Your physician has requested that you have an echocardiogram. Echocardiography is a painless test that uses sound waves to create images of your heart. It provides your doctor with information about the size and shape of your heart and how well your heart's chambers and valves are working. This procedure takes approximately one hour. There are no restrictions for this procedure. Please do NOT wear cologne, perfume, aftershave, or lotions (deodorant is allowed). Please arrive 15 minutes prior to your appointment time.  Please note: We ask at that you not bring children with you during ultrasound (echo/ vascular) testing. Due to room size and safety concerns, children are not allowed in the ultrasound rooms during exams. Our front office staff cannot provide observation of children in our lobby area while testing is being conducted. An adult accompanying a patient to their appointment will only be allowed in the ultrasound room at the discretion of the ultrasound technician under special circumstances. We apologize for any inconvenience.   Follow-Up: At [facility], you and your health needs are our priority.  As part of our continuing mission to provide you with exceptional heart care, we have created designated Provider Care Teams.  These Care Teams include your primary Cardiologist (physician) and Advanced Practice Providers (APPs -   Physician Assistants and Nurse Practitioners) who all work together to provide you with the care you need, when you need it.     Your next appointment:   11 month(s)  The format for your next appointment:   In Person  Provider:   Dr Carson Clara     Signed, Debra Schuller, MD  02/06/2024 4:26 PM    [facility] [address], [place], [place]  [address] Phone: [phone]

## 2024-02-07 ENCOUNTER — Ambulatory Visit (INDEPENDENT_AMBULATORY_CARE_PROVIDER_SITE_OTHER): Admitting: Internal Medicine

## 2024-02-07 DIAGNOSIS — Z7901 Long term (current) use of anticoagulants: Secondary | ICD-10-CM

## 2024-02-07 DIAGNOSIS — I480 Paroxysmal atrial fibrillation: Secondary | ICD-10-CM

## 2024-02-07 DIAGNOSIS — Z5181 Encounter for therapeutic drug level monitoring: Secondary | ICD-10-CM

## 2024-02-07 LAB — POCT INR: INR: 1.9 — AB (ref 2.0–3.0)

## 2024-02-07 NOTE — Patient Instructions (Signed)
 Description   Spoke with patient and instructed her to take 1 tablet of warfarin today and then continue taking Warfarin 1/2 tablet (2.5mg ) daily except 1 tablet (5mg ) on Mondays and Fridays.   Stay consistent with greens (3 per week). Recheck INR in 1 week  Anticoagulation Clinic [phone]

## 2024-02-14 ENCOUNTER — Ambulatory Visit (INDEPENDENT_AMBULATORY_CARE_PROVIDER_SITE_OTHER)

## 2024-02-14 DIAGNOSIS — Z5181 Encounter for therapeutic drug level monitoring: Secondary | ICD-10-CM | POA: Diagnosis not present

## 2024-02-14 LAB — POCT INR: INR: 3.1 — AB (ref 2.0–3.0)

## 2024-02-14 NOTE — Patient Instructions (Signed)
 Description   Spoke with patient and instructed her to eat a serving of greens today continue taking Warfarin 1/2 tablet (2.5mg ) daily except 1 tablet (5mg ) on Mondays and Fridays.   Stay consistent with greens (3 per week). Recheck INR in 2 weeks  Anticoagulation Clinic [phone]

## 2024-02-19 ENCOUNTER — Ambulatory Visit (INDEPENDENT_AMBULATORY_CARE_PROVIDER_SITE_OTHER): Admitting: Orthopedic Surgery

## 2024-02-19 ENCOUNTER — Other Ambulatory Visit (INDEPENDENT_AMBULATORY_CARE_PROVIDER_SITE_OTHER)

## 2024-02-19 DIAGNOSIS — M7712 Lateral epicondylitis, left elbow: Secondary | ICD-10-CM | POA: Diagnosis not present

## 2024-02-19 DIAGNOSIS — M25522 Pain in left elbow: Secondary | ICD-10-CM

## 2024-02-19 DIAGNOSIS — G5601 Carpal tunnel syndrome, right upper limb: Secondary | ICD-10-CM

## 2024-02-19 MED ORDER — BETAMETHASONE SOD PHOS & ACET 6 (3-3) MG/ML IJ SUSP
6.0000 mg | INTRAMUSCULAR | Status: AC | PRN
Start: 2024-02-19 — End: 2024-02-19
  Administered 2024-02-19: 6 mg via INTRA_ARTICULAR

## 2024-02-19 MED ORDER — LIDOCAINE HCL 1 % IJ SOLN
1.0000 mL | INTRAMUSCULAR | Status: AC | PRN
  Administered 2024-02-19: 1 mL

## 2024-02-19 NOTE — Progress Notes (Signed)
   Debra Wright - 73 y.o. female MRN [id number]  Date of birth: 01-24-1951  Office Visit Note: Visit Date: 02/19/2024 PCP: Ransom Byers, MD Referred by: Ransom Byers, *  Subjective:  HPI: Debra Wright is a 73 y.o. female who presents today for follow up 14 weeks status post right wrist revision carpal tunnel release with hypothenar fat pad transfer.  She is very pleased with her progress with the right hand, numbness and tingling has improved dramatically.  Today, her major complaint is the left elbow, with ongoing pain over the lateral aspect with associated pain with activities of daily living with lifting in particular.  Has not undergone prior workup or treatment for the left elbow.  Pertinent ROS were reviewed with the patient and found to be negative unless otherwise specified above in HPI.   Assessment & Plan: Visit Diagnoses:  1. Lateral epicondylitis, left elbow   2. Pain in left elbow   3. Carpal tunnel syndrome, right upper limb     Plan: I am very pleased to see how well she is doing status post right wrist revision carpal tunnel release with hypothenar fat pad transfer.  Her numbness and tingling has improved dramatically, her 2-point discrimination in the median nerve distribution of the right hand is between 5 to 6 mm which is excellent.  As for the left elbow, she is demonstrating signs and symptoms consistent with left lateral epicondylitis.  Counterforce brace was fitted today.  We also discussed potential cortisone injection for the left elbow at the lateral epicondyle region which she elected to proceed with.  Risks and benefits of the injection were discussed in detail as well.  Injection was provided today to the left lateral epicondyle region without incident.  As mentioned, she was comfortable with utilizing the counterforce brace moving forward.  I recommended that she follow-up with me in approximately 6 weeks to track her  progress.  Follow-up: No follow-ups on file.   Meds & Orders: No orders of the defined types were placed in this encounter.   Orders Placed This Encounter  Procedures   Medium Joint Inj   XR Elbow Complete Left (3+View)     Procedures: Medium Joint Inj: L lateral epicondyle on 02/19/2024 9:19 PM Indications: pain Details: 25 G needle, lateral approach Medications: 1 mL lidocaine  1 %; 6 mg betamethasone acetate-betamethasone sodium phosphate  6 (3-3) MG/ML Outcome: tolerated well, no immediate complications Procedure, treatment alternatives, risks and benefits explained, specific risks discussed.           Objective:   Vital Signs: There were no vitals taken for this visit.  Ortho Exam Right wrist with well-healed volar incision stemming from the palmar aspect of the hand into the forearm, no erythema or drainage, composite fist without restriction, sensations intact light touch in the median nerve distribution, 2-point discrimination is between 5 to 7 mm throughout the median nerve distribution of the right hand  Left elbow has significant pain over the lateral epicondyle region, pain with resisted wrist extension and long finger extension Elbow with appropriate range of motion 0-1 40 without restriction flexion/extension, full pronation and supination of the forearm without restriction  Imaging: XR Elbow Complete Left (3+View) Result Date: 02/19/2024 There is no evidence of fracture or dislocation. There is no evidence of arthropathy or other focal bone abnormality.  Calcifications are seen over the lateral aspect of the elbow.     Albaro Deviney Alvia Jointer, M.D. [facility], Hand Surgery

## 2024-02-22 ENCOUNTER — Other Ambulatory Visit: Payer: Self-pay

## 2024-02-22 MED ORDER — DILTIAZEM HCL ER COATED BEADS 180 MG PO CP24: ORAL_CAPSULE | ORAL | 3 refills | Status: AC

## 2024-02-27 ENCOUNTER — Other Ambulatory Visit: Payer: Self-pay

## 2024-02-27 MED ORDER — DILTIAZEM HCL ER BEADS 180 MG PO CP24: 180.0000 mg | ORAL_CAPSULE | Freq: Every day | ORAL | 3 refills | Status: AC

## 2024-02-28 ENCOUNTER — Telehealth: Payer: Self-pay

## 2024-02-28 NOTE — Telephone Encounter (Signed)
 INR due. Called pt, no answer. Left message on voicemail.

## 2024-02-29 ENCOUNTER — Telehealth: Payer: Self-pay

## 2024-02-29 ENCOUNTER — Ambulatory Visit (INDEPENDENT_AMBULATORY_CARE_PROVIDER_SITE_OTHER): Payer: Self-pay | Admitting: Cardiology

## 2024-02-29 DIAGNOSIS — Z7901 Long term (current) use of anticoagulants: Secondary | ICD-10-CM | POA: Diagnosis not present

## 2024-02-29 DIAGNOSIS — I480 Paroxysmal atrial fibrillation: Secondary | ICD-10-CM

## 2024-02-29 LAB — POCT INR: INR: 3.8 — AB (ref 2.0–3.0)

## 2024-02-29 NOTE — Telephone Encounter (Signed)
 Lm to check INR today

## 2024-03-01 DIAGNOSIS — I10 Essential (primary) hypertension: Secondary | ICD-10-CM | POA: Diagnosis not present

## 2024-03-01 DIAGNOSIS — I4821 Permanent atrial fibrillation: Secondary | ICD-10-CM | POA: Diagnosis not present

## 2024-03-01 DIAGNOSIS — E1169 Type 2 diabetes mellitus with other specified complication: Secondary | ICD-10-CM | POA: Diagnosis not present

## 2024-03-01 DIAGNOSIS — I480 Paroxysmal atrial fibrillation: Secondary | ICD-10-CM | POA: Diagnosis not present

## 2024-03-01 DIAGNOSIS — I350 Nonrheumatic aortic (valve) stenosis: Secondary | ICD-10-CM | POA: Diagnosis not present

## 2024-03-04 DIAGNOSIS — F5104 Psychophysiologic insomnia: Secondary | ICD-10-CM | POA: Diagnosis not present

## 2024-03-04 DIAGNOSIS — F5101 Primary insomnia: Secondary | ICD-10-CM | POA: Diagnosis not present

## 2024-03-04 DIAGNOSIS — E1165 Type 2 diabetes mellitus with hyperglycemia: Secondary | ICD-10-CM | POA: Diagnosis not present

## 2024-03-04 DIAGNOSIS — M545 Low back pain, unspecified: Secondary | ICD-10-CM | POA: Diagnosis not present

## 2024-03-06 DIAGNOSIS — I4821 Permanent atrial fibrillation: Secondary | ICD-10-CM | POA: Diagnosis not present

## 2024-03-06 DIAGNOSIS — E1169 Type 2 diabetes mellitus with other specified complication: Secondary | ICD-10-CM | POA: Diagnosis not present

## 2024-03-06 DIAGNOSIS — I350 Nonrheumatic aortic (valve) stenosis: Secondary | ICD-10-CM | POA: Diagnosis not present

## 2024-03-06 DIAGNOSIS — I10 Essential (primary) hypertension: Secondary | ICD-10-CM | POA: Diagnosis not present

## 2024-03-07 ENCOUNTER — Other Ambulatory Visit

## 2024-03-07 DIAGNOSIS — E11649 Type 2 diabetes mellitus with hypoglycemia without coma: Secondary | ICD-10-CM | POA: Diagnosis not present

## 2024-03-07 DIAGNOSIS — N390 Urinary tract infection, site not specified: Secondary | ICD-10-CM | POA: Diagnosis not present

## 2024-03-08 LAB — LIPID PANEL
Cholesterol: 103 mg/dL (ref <200)
HDL: 37 mg/dL — ABNORMAL LOW (ref >=50)
LDL Cholesterol (Calc): 39 mg/dL
Non-HDL Cholesterol (Calc): 66 mg/dL (ref <130)
Total CHOL/HDL Ratio: 2.8 (calc) (ref <5.0)
Triglycerides: 199 mg/dL — ABNORMAL HIGH (ref <150)

## 2024-03-08 LAB — HEMOGLOBIN A1C
Hgb A1c MFr Bld: 8.2 % — ABNORMAL HIGH (ref <5.7)
Mean Plasma Glucose: 189 mg/dL
eAG (mmol/L): 10.4 mmol/L

## 2024-03-08 LAB — MICROALBUMIN / CREATININE URINE RATIO
Creatinine, Urine: 66 mg/dL (ref 20–275)
Microalb Creat Ratio: 318 mg/g{creat} — ABNORMAL HIGH (ref <30)
Microalb, Ur: 21 mg/dL

## 2024-03-10 ENCOUNTER — Encounter: Payer: Self-pay | Admitting: "Endocrinology

## 2024-03-10 ENCOUNTER — Other Ambulatory Visit

## 2024-03-13 ENCOUNTER — Ambulatory Visit (INDEPENDENT_AMBULATORY_CARE_PROVIDER_SITE_OTHER): Payer: Self-pay | Admitting: Cardiology

## 2024-03-13 ENCOUNTER — Ambulatory Visit: Admitting: "Endocrinology

## 2024-03-13 DIAGNOSIS — Z5181 Encounter for therapeutic drug level monitoring: Secondary | ICD-10-CM

## 2024-03-13 DIAGNOSIS — I480 Paroxysmal atrial fibrillation: Secondary | ICD-10-CM

## 2024-03-13 LAB — POCT INR: INR: 2.1 (ref 2.0–3.0)

## 2024-03-13 NOTE — Patient Instructions (Signed)
 Description   Spoke with patient and instructed her to continue taking Warfarin 1/2 tablet (2.5mg ) daily except 1 tablet (5mg ) on Mondays and Fridays.   Stay consistent with greens (3 per week). Recheck INR in 2 weeks  Anticoagulation Clinic [phone]

## 2024-03-17 DIAGNOSIS — M545 Low back pain, unspecified: Secondary | ICD-10-CM | POA: Diagnosis not present

## 2024-03-17 DIAGNOSIS — N390 Urinary tract infection, site not specified: Secondary | ICD-10-CM | POA: Diagnosis not present

## 2024-03-17 DIAGNOSIS — F5104 Psychophysiologic insomnia: Secondary | ICD-10-CM | POA: Diagnosis not present

## 2024-03-17 DIAGNOSIS — B9689 Other specified bacterial agents as the cause of diseases classified elsewhere: Secondary | ICD-10-CM | POA: Diagnosis not present

## 2024-03-17 DIAGNOSIS — E7800 Pure hypercholesterolemia, unspecified: Secondary | ICD-10-CM | POA: Diagnosis not present

## 2024-03-17 DIAGNOSIS — Z0000 Encounter for general adult medical examination without abnormal findings: Secondary | ICD-10-CM | POA: Diagnosis not present

## 2024-03-17 DIAGNOSIS — Z79899 Other long term (current) drug therapy: Secondary | ICD-10-CM | POA: Diagnosis not present

## 2024-03-17 DIAGNOSIS — F410 Panic disorder [episodic paroxysmal anxiety] without agoraphobia: Secondary | ICD-10-CM | POA: Diagnosis not present

## 2024-03-17 DIAGNOSIS — F320 Major depressive disorder, single episode, mild: Secondary | ICD-10-CM | POA: Diagnosis not present

## 2024-03-24 DIAGNOSIS — N390 Urinary tract infection, site not specified: Secondary | ICD-10-CM | POA: Diagnosis not present

## 2024-03-27 ENCOUNTER — Ambulatory Visit (INDEPENDENT_AMBULATORY_CARE_PROVIDER_SITE_OTHER): Payer: Self-pay

## 2024-03-27 DIAGNOSIS — I480 Paroxysmal atrial fibrillation: Secondary | ICD-10-CM | POA: Diagnosis not present

## 2024-03-27 DIAGNOSIS — Z5181 Encounter for therapeutic drug level monitoring: Secondary | ICD-10-CM

## 2024-03-27 LAB — POCT INR: INR: 2.8 (ref 2.0–3.0)

## 2024-03-27 NOTE — Progress Notes (Signed)
Please see anticoagulation encounter.

## 2024-03-27 NOTE — Patient Instructions (Signed)
 Description   Spoke with patient and instructed her to continue taking Warfarin 1/2 tablet (2.5mg ) daily except 1 tablet (5mg ) on Mondays and Fridays.   Stay consistent with greens (3 per week). Recheck INR in 2 weeks  Anticoagulation Clinic [phone]

## 2024-03-31 DIAGNOSIS — E1169 Type 2 diabetes mellitus with other specified complication: Secondary | ICD-10-CM | POA: Diagnosis not present

## 2024-03-31 DIAGNOSIS — I4821 Permanent atrial fibrillation: Secondary | ICD-10-CM | POA: Diagnosis not present

## 2024-03-31 DIAGNOSIS — I350 Nonrheumatic aortic (valve) stenosis: Secondary | ICD-10-CM | POA: Diagnosis not present

## 2024-03-31 DIAGNOSIS — I10 Essential (primary) hypertension: Secondary | ICD-10-CM | POA: Diagnosis not present

## 2024-04-01 ENCOUNTER — Encounter: Payer: Self-pay | Admitting: "Endocrinology

## 2024-04-01 ENCOUNTER — Other Ambulatory Visit: Payer: Self-pay | Admitting: "Endocrinology

## 2024-04-01 ENCOUNTER — Ambulatory Visit (INDEPENDENT_AMBULATORY_CARE_PROVIDER_SITE_OTHER): Admitting: "Endocrinology

## 2024-04-01 VITALS — BP 106/70 | HR 92 | Ht 61.0 in | Wt 231.0 lb

## 2024-04-01 DIAGNOSIS — Z7984 Long term (current) use of oral hypoglycemic drugs: Secondary | ICD-10-CM | POA: Diagnosis not present

## 2024-04-01 DIAGNOSIS — E11649 Type 2 diabetes mellitus with hypoglycemia without coma: Secondary | ICD-10-CM | POA: Diagnosis not present

## 2024-04-01 DIAGNOSIS — E782 Mixed hyperlipidemia: Secondary | ICD-10-CM

## 2024-04-01 MED ORDER — FREESTYLE LIBRE 3 PLUS SENSOR MISC: 3 refills | Status: DC

## 2024-04-01 NOTE — Progress Notes (Signed)
 Outpatient Endocrinology Note Debra Birmingham, MD    Kriti Katayama 20-Feb-1951 [id number]  Referring Provider: Chrystal Lamarr RAMAN, * Primary Care Provider: Chrystal Lamarr RAMAN, MD Reason for consultation: Subjective  Type 2 diabetes mellitus  Assessment & Plan  Diagnoses and all orders for this visit:  Uncontrolled type 2 diabetes mellitus with hypoglycemia without coma (HCC) -     Ambulatory referral to Podiatry -     Lipid panel -     Comprehensive metabolic panel with GFR -     Hemoglobin A1c  Long term (current) use of oral hypoglycemic drugs  Mixed hypercholesterolemia and hypertriglyceridemia  Other orders -     Continuous Glucose Sensor (FREESTYLE LIBRE 3 PLUS SENSOR) MISC; Change sensor every 15 days.   Diabetes complicated by neuropathy Hba1c goal less than 7.5, current Hba1c is 8.2%. Will recommend for the following change of medications to: Metformin  1000 mg twice daily Glipizide  XL 10 mg every morning  Ordered DexCom and libre, pt has yet to check the cost  Reinforced BG check tid 2 hrs after meals Counseled on lifestyle changes   Cannot afford Jardiance  25 mg every day Ozempic  0.25 mg/week not covered  Stop Glipizide  XL 10 mg every morning (hold if blood sugar is less than 100)  No history of MEN syndrome/medullary thyroid  cancer/pancreatitis or pancreatic cancer in self or family  Start gabapentin  100 mg qd upto 3 pills PRN neuropathic burning pain  Check BG alternating times of the day and bring log  No known contraindications to any of above medications  Hyperlipidemia -Last LDL near goal: 82 -on rosuvastatin  20 mg QD -Follow low fat diet and exercise   -Blood pressure goal <140/90 - Microalbumin/creatinine at goal < 30 -not on ACE/ARB -diet changes including salt restriction -limit eating outside -counseled BP targets per standards of diabetes care -Uncontrolled blood pressure can lead to retinopathy, nephropathy and  cardiovascular and atherosclerotic heart disease  -Na low at 131, new finding  Speak to PCP about hyponatremia, pt is every other day spirolactone 25 mg  Pt understands and agreed  Reviewed and counseled on: -A1C target -Blood sugar targets -Complications of uncontrolled diabetes  -Checking blood sugar before meals and bedtime and bring log next visit -All medications with mechanism of action and side effects -Hypoglycemia management: rule of 15's, Glucagon Emergency Kit and medical alert ID -low-carb low-fat plate-method diet -At least 20 minutes of physical activity per day -Annual dilated retinal eye exam and foot exam -compliance and follow up needs -follow up as scheduled or earlier if problem gets worse  Call if blood sugar is less than 70 or consistently above 250    Take a 15 gm snack of carbohydrate at bedtime before you go to sleep if your blood sugar is less than 100.    If you are going to fast after midnight for a test or procedure, ask your physician for instructions on how to reduce/decrease your insulin  dose.    Call if blood sugar is less than 70 or consistently above 250  -Treating a low sugar by rule of 15  (15 gms of sugar every 15 min until sugar is more than 70) If you feel your sugar is low, test your sugar to be sure If your sugar is low (less than 70), then take 15 grams of a fast acting Carbohydrate (3-4 glucose tablets or glucose gel or 4 ounces of juice or regular soda) Recheck your sugar 15 min after treating low to  make sure it is more than 70 If sugar is still less than 70, treat again with 15 grams of carbohydrate          Don't drive the hour of hypoglycemia  If unconscious/unable to eat or drink by mouth, use glucagon injection or nasal spray baqsimi and call 911. Can repeat again in 15 min if still unconscious.  Return in about 3 months (around 07/02/2024) for visit and 8 am labs before next visit.   I spent more than 50% of today's visit  counseling patient on symptoms, examination findings, lab findings, imaging results, treatment decisions and monitoring and prognosis. The patient understood the recommendations and agrees with the treatment plan. All questions regarding treatment plan were fully answered.  Debra Birmingham, MD  04/01/24    History of Present Illness Debra Wright is a 73 y.o. year old female who presents for evaluation of Type 2 diabetes mellitus.  Debra Wright was first diagnosed in 2014.   Diabetes education +  Home diabetes regimen: Metformin  1000 mg twice daily Glipizide  XL 10 mg every morning   Cannot Jardiance  25 mg every day Ozempic  0.25 mg/week not covered  Non-insulin  hypoglycemic drugs previously used: Metformin , glipizide , repaglinide  1 mg since 3/22 (stopped in 05/2022), Januvia   COMPLICATIONS -  MI/Stroke -  retinopathy, last eye exam 2024 +  neuropathy -  nephropathy  BLOOD SUGAR DATA 110-180, checks 3x/week, checks different times  Did not bring meter   Physical Exam  BP 106/70   Pulse 92   Ht 5' 1 (1.549 m)   Wt 231 lb (104.8 kg)   SpO2 97%   BMI 43.65 kg/m    Constitutional: well developed, well nourished Head: normocephalic, atraumatic Eyes: sclera anicteric, no redness Neck: supple Lungs: normal respiratory effort Neurology: alert and oriented Skin: dry, bruises all over body (on warfarin for A.fib) Musculoskeletal: no appreciable defects Psychiatric: normal mood and affect Diabetic Foot Exam - Simple   Simple Foot Form Diabetic Foot exam was performed with the following findings: Yes 04/01/2024  2:35 PM  Visual Inspection No deformities, no ulcerations, no other skin breakdown bilaterally: Yes Sensation Testing Intact to touch and monofilament testing bilaterally: Yes Pulse Check Posterior Tibialis and Dorsalis pulse intact bilaterally: Yes Comments Right big toe nail fell off due to fungus, history of diabetes, R foot calluses?      Current  Medications Patient's Medications  New Prescriptions   CONTINUOUS GLUCOSE SENSOR (FREESTYLE LIBRE 3 PLUS SENSOR) MISC    Change sensor every 15 days.  Previous Medications   BLOOD GLUCOSE MONITORING SUPPL (ACCU-CHEK GUIDE ME) W/DEVICE KIT    USE AS DIRECTED.  Dx E11.9.   CALCIUM  CARBONATE (TUMS - DOSED IN MG ELEMENTAL CALCIUM ) 500 MG CHEWABLE TABLET    Chew 2 tablets by mouth daily as needed for indigestion or heartburn.   CALCIUM  CARBONATE-VITAMIN D (CALCIUM  600 + D PO)    Take 1 tablet by mouth daily.   CETIRIZINE (ZYRTEC) 10 MG TABLET    Take 10 mg by mouth daily as needed for allergies.   CINNAMON PO    Take 2 tablets by mouth daily.   CLONAZEPAM  (KLONOPIN ) 0.5 MG TABLET    TAKE 1/2 TO 1 (ONE-HALF TO ONE) TABLET BY MOUTH AT BEDTIME AS NEEDED   CONTINUOUS GLUCOSE SENSOR (DEXCOM G7 SENSOR) MISC    1 Device by Does not apply route continuous.   CYANOCOBALAMIN  (VITAMIN B12) 1000 MCG TBCR    Take 1,000 mcg by mouth  daily.   DILTIAZEM  (CARDIZEM  CD) 180 MG 24 HR CAPSULE    TAKE ONE CAPSULE BY MOUTH ONCE A DAY   DILTIAZEM  (TIAZAC ) 180 MG 24 HR CAPSULE    Take 1 capsule (180 mg total) by mouth daily.   EMPAGLIFLOZIN  (JARDIANCE ) 25 MG TABS TABLET    Take 1 tablet (25 mg total) by mouth daily before breakfast.   ESCITALOPRAM (LEXAPRO) 10 MG TABLET    1/2 tablet once a day x 1 week, then increase to 1 tablet daily   ESZOPICLONE  3 MG TABS    Take 1 tablet (3 mg total) by mouth at bedtime as needed. Take immediately before bedtime   FERROUS SULFATE (IRON) 325 (65 FE) MG TABS    Take 325 mg by mouth 2 (two) times daily.   GABAPENTIN  (NEURONTIN ) 100 MG CAPSULE    Take 1 capsule (100 mg total) by mouth daily.   GLIPIZIDE  (GLUCOTROL  XL) 5 MG 24 HR TABLET    Take 1 tablet (5 mg total) by mouth daily with breakfast. Hold if BG <80   GLUCOSE BLOOD (ACCU-CHEK GUIDE) TEST STRIP    CHECK BLOOD GLUCOSE IN THE MORNING AND AT BEDTIME.  E11.9 code.   MAGNESIUM  OXIDE (MAG-OX) 400 (241.3 MG) MG TABLET    Take 400 mg by  mouth daily.   METFORMIN  (GLUCOPHAGE ) 1000 MG TABLET    Take 1 tablet (1,000 mg total) by mouth 2 (two) times daily with a meal.   METOPROLOL  TARTRATE (LOPRESSOR ) 25 MG TABLET    Take 1 tablet (25 mg total) by mouth 2 (two) times daily.   OMEGA-3 FATTY ACIDS (FISH OIL) 1200 MG CAPS    Take 1,200 mg by mouth 2 (two) times daily.   OMEPRAZOLE  (PRILOSEC) 40 MG CAPSULE    Take 1 capsule by mouth once daily   OXYBUTYNIN (DITROPAN) 5 MG TABLET    Take 5 mg by mouth 2 (two) times daily.   ROSUVASTATIN  (CRESTOR ) 20 MG TABLET    Take 1 tablet (20 mg total) by mouth daily.   SEMAGLUTIDE ,0.25 OR 0.5MG /DOS, 2 MG/3ML SOPN    Inject 0.25 mg into the skin once a week.   SPIRONOLACTONE  (ALDACTONE ) 25 MG TABLET    TAKE HALF TABLET BY MOUTH DAILY   TOLTERODINE  (DETROL  LA) 4 MG 24 HR CAPSULE    Take 4 mg by mouth daily.   VITAMIN C (ASCORBIC ACID) 250 MG TABLET    Take 250 mg by mouth daily.   WARFARIN (COUMADIN ) 5 MG TABLET    TAKE ONE-HALF TO ONE TABLET BY MOUTH DAILY AS DIRECTED BY COUMADIN  CLINIC  Modified Medications   No medications on file  Discontinued Medications   No medications on file    Allergies Allergies  Allergen Reactions   Antivert  [Meclizine  Hcl] Other (See Comments)    makes me feel like my skin is falling off.  03/05/20 pt states she was placed on this a few months back and did not have a reaction   Meclizine  Other (See Comments)    makes me feel like my skin is falling off.  03/05/20 pt states she was placed on this a few months back and did not have a reaction Other reaction(s): jumping out of skin, jittery   Trazodone Hcl Other (See Comments)    Other reaction(s): nightmares   Zolpidem  Other (See Comments)    Reverse affect   Zolpidem  Tartrate     Other reaction(s): doing things in sleep unaware    Past Medical  History Past Medical History:  Diagnosis Date   Anxiety    Arthritis    knees; right thumb (10/14/2014)   Basal cell carcinoma    burned off upper lip &  right shoulder   Chronic anticoagulation    CHADS VASC=3   Complication of anesthesia    after spinal anesthesia for left knee replacement, bladder did not wake up. Was incontinent for 4 days post surgery   Depression    Fibroid tumor    Frequent diarrhea    GERD (gastroesophageal reflux disease)    High cholesterol    Hypertension    EF 65-70% grade 2DD   Insomnia    Migraine    frequently when I was younger; maybe monthly now (10/14/2014) - none after hysterectomy   PAF (paroxysmal atrial fibrillation) (HCC) 05/02/2018   converted with rate control   Pernicious anemia    pt not aware of this   Sleep apnea    uses a cpap   Type II diabetes mellitus (HCC)    Vitamin B 12 deficiency     Past Surgical History Past Surgical History:  Procedure Laterality Date   ABDOMINAL HYSTERECTOMY  1988   ACHILLES TENDON SURGERY Right    APPENDECTOMY  1988   BILATERAL OOPHORECTOMY  2004   CARPAL TUNNEL RELEASE Bilateral 1986   CARPAL TUNNEL RELEASE Right 11/09/2023   Procedure: RIGHT CARPAL TUNNEL RELEASE;  Surgeon: Arlinda Buster, MD;  Location: [facility];  Service: Orthopedics;  Laterality: Right;   CATARACT EXTRACTION W/ INTRAOCULAR LENS  IMPLANT, BILATERAL Bilateral 2011   COLONOSCOPY     HYPOTHENAR FAT PAD TRANSFER Right 11/09/2023   Procedure: RIGHT HYPOTHENAR FAT PAD TRANSFER;  Surgeon: Arlinda Buster, MD;  Location: [facility];  Service: Orthopedics;  Laterality: Right;   JOINT REPLACEMENT     SHOULDER ARTHROSCOPY DISTAL CLAVICLE EXCISION AND OPEN ROTATOR CUFF REPAIR Right 11/2013   TOENAIL EXCISION Right 04/2018   big toe   TONSILLECTOMY  1950's   TOTAL KNEE ARTHROPLASTY Left 10/13/2014   Procedure: LEFT TOTAL KNEE ARTHROPLASTY;  Surgeon: Lonni CINDERELLA Poli, MD;  [facility];  Service: Orthopedics;  Laterality: Left;   TOTAL KNEE ARTHROPLASTY Right 12/10/2018   Procedure: RIGHT TOTAL KNEE ARTHROPLASTY;  Surgeon: Poli Lonni CINDERELLA,  MD;  Location: [facility] OR;  Service: Orthopedics;  Laterality: Right;    Family History family history includes Arthritis in her father, maternal grandfather, maternal grandmother, mother, paternal grandfather, and paternal grandmother; Diabetes in her brother, father, maternal grandmother, mother, paternal grandfather, and paternal grandmother; Heart disease in her father; Hyperlipidemia in her father, maternal grandfather, maternal grandmother, mother, paternal grandfather, and paternal grandmother; Hypertension in her brother, maternal grandfather, maternal grandmother, mother, paternal grandfather, and paternal grandmother; Stroke in her brother.  Social History Social History   Socioeconomic History   Marital status: Married    Spouse name: Not on file   Number of children: 1   Years of education: 14   Highest education level: Not on file  Occupational History   Not on file  Tobacco Use   Smoking status: Never   Smokeless tobacco: Never  Vaping Use   Vaping status: Never Used  Substance and Sexual Activity   Alcohol use: Yes    Comment: 10/14/2014 might have a drink when I'm out once/month   Drug use: No   Sexual activity: Not Currently    Birth control/protection: Surgical    Comment: hyst  Other Topics Concern   Not  on file  Social History Narrative   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   Married       Right handed    Drinks caffeine one cup of coffee daily   An apartment    Social Drivers of Corporate investment banker Strain: Not on file  Food Insecurity: Not on file  Transportation Needs: Not on file  Physical Activity: Not on file  Stress: Not on file  Social Connections: Not on file  Intimate Partner Violence: Not on file    Lab Results  Component Value Date   HGBA1C 8.2 (H) 03/07/2024   Lab Results  Component Value Date   CHOL 103 03/07/2024   Lab Results  Component Value Date   HDL 37 (L) 03/07/2024   Lab Results  Component Value Date    LDLCALC 39 03/07/2024   Lab Results  Component Value Date   TRIG 199 (H) 03/07/2024   Lab Results  Component Value Date   CHOLHDL 2.8 03/07/2024   Lab Results  Component Value Date   CREATININE 0.81 11/08/2023   Lab Results  Component Value Date   GFR 82.37 05/07/2023   Lab Results  Component Value Date   MICROALBUR 21.0 03/07/2024      Component Value Date/Time   NA 133 (L) 11/08/2023 1330   NA 141 01/15/2020 1417   K 4.9 11/08/2023 1330   CL 100 11/08/2023 1330   CO2 22 11/08/2023 1330   GLUCOSE 280 (H) 11/08/2023 1330   BUN 18 11/08/2023 1330   BUN 8 01/15/2020 1417   CREATININE 0.81 11/08/2023 1330   CREATININE 0.59 (L) 11/26/2019 1319   CALCIUM  8.9 11/08/2023 1330   PROT 6.1 05/07/2023 1432   ALBUMIN 3.6 05/07/2023 1432   AST 22 05/07/2023 1432   ALT 22 05/07/2023 1432   ALKPHOS 62 05/07/2023 1432   BILITOT 0.3 05/07/2023 1432   GFRNONAA >60 11/08/2023 1330   GFRAA 110 01/15/2020 1417      Latest Ref Rng & Units 11/08/2023    1:30 PM 05/07/2023    2:32 PM 02/01/2023    2:23 PM  BMP  Glucose 70 - 99 mg/dL 719  847  868   BUN 8 - 23 mg/dL 18  15  14    Creatinine 0.44 - 1.00 mg/dL 9.18  9.27  9.24   Sodium 135 - 145 mmol/L 133  137  140   Potassium 3.5 - 5.1 mmol/L 4.9  4.1  4.1   Chloride 98 - 111 mmol/L 100  104  102   CO2 22 - 32 mmol/L 22  23  28    Calcium  8.9 - 10.3 mg/dL 8.9  8.7  9.2        Component Value Date/Time   WBC 8.8 07/12/2022 1803   RBC 4.92 07/12/2022 1803   HGB 14.2 07/12/2022 1803   HCT 44.6 07/12/2022 1803   PLT 264 07/12/2022 1803   MCV 90.7 07/12/2022 1803   MCH 28.9 07/12/2022 1803   MCHC 31.8 07/12/2022 1803   RDW 14.4 07/12/2022 1803   LYMPHSABS 2.7 07/12/2022 1803   MONOABS 0.7 07/12/2022 1803   EOSABS 0.1 07/12/2022 1803   BASOSABS 0.0 07/12/2022 1803     Parts of this note may have been dictated using voice recognition software. There may be variances in spelling and vocabulary which are unintentional. Not all  errors are proofread. Please notify the dino if any discrepancies are noted or if the  meaning of any statement is not clear.

## 2024-04-02 ENCOUNTER — Other Ambulatory Visit: Payer: Self-pay

## 2024-04-02 ENCOUNTER — Other Ambulatory Visit: Payer: Self-pay | Admitting: "Endocrinology

## 2024-04-02 DIAGNOSIS — E11649 Type 2 diabetes mellitus with hypoglycemia without coma: Secondary | ICD-10-CM

## 2024-04-02 MED ORDER — FREESTYLE LIBRE 3 PLUS SENSOR MISC: 3 refills | Status: DC

## 2024-04-07 ENCOUNTER — Other Ambulatory Visit: Payer: Self-pay | Admitting: "Endocrinology

## 2024-04-07 DIAGNOSIS — E1165 Type 2 diabetes mellitus with hyperglycemia: Secondary | ICD-10-CM

## 2024-04-08 NOTE — Telephone Encounter (Signed)
 Requested Prescriptions   Pending Prescriptions Disp Refills   metFORMIN  (GLUCOPHAGE ) 1000 MG tablet [Pharmacy Med Name: METFORMIN  HCL 1,000 MG TABLET] 180 tablet 0    Sig: TAKE 1 TABLET (1,000 MG TOTAL) BY MOUTH TWICE A DAY WITH FOOD

## 2024-04-10 ENCOUNTER — Ambulatory Visit (INDEPENDENT_AMBULATORY_CARE_PROVIDER_SITE_OTHER): Payer: Self-pay | Admitting: Cardiology

## 2024-04-10 DIAGNOSIS — I480 Paroxysmal atrial fibrillation: Secondary | ICD-10-CM

## 2024-04-10 DIAGNOSIS — Z5181 Encounter for therapeutic drug level monitoring: Secondary | ICD-10-CM

## 2024-04-10 LAB — POCT INR: INR: 3.6 — AB (ref 2.0–3.0)

## 2024-04-10 NOTE — Patient Instructions (Signed)
 Description   Spoke with patient and instructed her to Hold warfarin today and then continue taking Warfarin 1/2 tablet (2.5mg ) daily except 1 tablet (5mg ) on Mondays and Fridays.   Stay consistent with greens (3 per week). Recheck INR in 2 weeks  Anticoagulation Clinic [phone]

## 2024-04-10 NOTE — Progress Notes (Signed)
Please see anticoagulation encounter.

## 2024-04-12 ENCOUNTER — Encounter: Payer: Self-pay | Admitting: "Endocrinology

## 2024-04-14 ENCOUNTER — Other Ambulatory Visit (INDEPENDENT_AMBULATORY_CARE_PROVIDER_SITE_OTHER): Payer: Self-pay

## 2024-04-14 ENCOUNTER — Ambulatory Visit (INDEPENDENT_AMBULATORY_CARE_PROVIDER_SITE_OTHER): Admitting: Physician Assistant

## 2024-04-14 ENCOUNTER — Other Ambulatory Visit (HOSPITAL_COMMUNITY): Payer: Self-pay

## 2024-04-14 ENCOUNTER — Encounter: Payer: Self-pay | Admitting: "Endocrinology

## 2024-04-14 DIAGNOSIS — M545 Low back pain, unspecified: Secondary | ICD-10-CM

## 2024-04-14 DIAGNOSIS — M25512 Pain in left shoulder: Secondary | ICD-10-CM

## 2024-04-14 DIAGNOSIS — G8929 Other chronic pain: Secondary | ICD-10-CM | POA: Diagnosis not present

## 2024-04-14 MED ORDER — METHYLPREDNISOLONE ACETATE 40 MG/ML IJ SUSP
40.0000 mg | INTRAMUSCULAR | Status: AC | PRN
  Administered 2024-04-14: 40 mg via INTRA_ARTICULAR

## 2024-04-14 MED ORDER — GLIPIZIDE ER 5 MG PO TB24: 5.0000 mg | ORAL_TABLET | Freq: Every day | ORAL | 1 refills | Status: AC

## 2024-04-14 MED ORDER — LIDOCAINE HCL 1 % IJ SOLN
3.0000 mL | INTRAMUSCULAR | Status: AC | PRN
  Administered 2024-04-14: 3 mL

## 2024-04-14 NOTE — Progress Notes (Signed)
 Office Visit Note   Patient: Debra Wright           Date of Birth: August 30, 1951           MRN: [id number] Visit Date: 04/14/2024              Requested by: Chrystal Lamarr RAMAN, MD [phone] [facility] [address] [place],  [place] PCP: Chrystal Lamarr RAMAN, MD   Assessment & Plan: Visit Diagnoses:  1. Acute pain of left shoulder   2. Chronic left-sided low back pain without sciatica     Plan:  Discussed with her sending her to formal therapy per her back she defers.  Therefore recommend chiropractic treatment.  Will see her back for follow-up in 3 weeks for her shoulder status post subacromial injection she did have decreased pain in the shoulder and increased range of motion.  Questions were encouraged and answered at length.  She does have a trigger thumb that she wants us  to look at it next time.  She is to monitor her glucose levels closely over the next 24 to 48 hours  Follow-Up Instructions: Return in about 3 weeks (around 05/05/2024).   Orders:  Orders Placed This Encounter  Procedures   Large Joint Inj   XR Shoulder Left   XR Lumbar Spine 2-3 Views   No orders of the defined types were placed in this encounter.     Procedures: Large Joint Inj: L subacromial bursa on 04/14/2024 9:01 PM Indications: pain Details: 22 G 1.5 in needle, superior approach  Arthrogram: No  Medications: 3 mL lidocaine  1 %; 40 mg methylPREDNISolone  acetate 40 MG/ML Outcome: tolerated well, no immediate complications Procedure, treatment alternatives, risks and benefits explained, specific risks discussed. Consent was given by the patient. Immediately prior to procedure a time out was called to verify the correct patient, procedure, equipment, support staff and site/side marked as required. Patient was prepped and draped in the usual sterile fashion.       Clinical Data: No additional findings.   Subjective: Chief Complaint  Patient presents with   Lower Back - Pain   Left Shoulder - Pain     HPI Debra Wright comes in today with low back pain left-sided.  States pain is worse with getting in and out of bed and standing up.  Walking also makes the pain increase.  No radicular symptoms down the leg no injury.  Denies any fevers/chills, saddle anesthesia, waking sensation or acute bowel or bladder dysfunction.  She recently had kidney infection was on antibiotics but this is cleared.  She has a referral to [facility] but has not been seen as of yet and is unsure of when this appointment will be.  Left shoulder pain no recent injury.  She is having pain reaching above her head.  No numbness tingling down the arm.  She is diabetic last hemoglobin A1c was 8.2 in early June.  Review of Systems See HPI otherwise negative  Objective: Vital Signs: There were no vitals taken for this visit.  Physical Exam General: Well-developed well-nourished female is able to get on and off the exam table on her own. Psych: Alert and oriented x 3 Vascular: Dorsal pedal pulses are 2+ and equal symmetric Lower extremities: 5 out of 5 strength throughout lower extremities against resistance.  Negative straight leg raise bilaterally.  Good range of motion bilateral hips without pain.  Tenderness left lower lumbar paraspinous region. Bilateral shoulders: Right shoulder no rotator cuff deficiency.  Left  shoulder active forward flexion 160 5 out of 5 strength external and internal rotation against resistance.  Empty can test is negative bilaterally.  Liftoff test negative bilaterally.   Specialty Comments:  09/03/2015 NCV & EMG Findings: Extensive electrodiagnostic testing of the left upper extremity shows: 1. Left median sensory response is mildly prolonged (4.1 ms) normal amplitude. Left ulnar sensory response is within normal limits. 2. Left median and ulnar motor responses are within normal limits. 3. There is no evidence of active or chronic motor axon loss changes affecting any of the tested  muscles. Motor unit configuration and recruitment pattern is within normal limits.   Impression: 1. Residuals of a left median neuropathy at or distal to the wrist, consistent with the clinical diagnosis of carpal tunnel syndrome.  Overall, these findings are mild in degree electrically. 2. There is no evidence of a cervical radiculopathy affecting the left upper extremity.     _____________________________ Tonita Blanch, D.O.  Imaging: XR Lumbar Spine 2-3 Views Result Date: 04/14/2024 Bar spine: 2 view: Facet changes lower lumbar arthrosclerosis of the aorta.  L3-4 L4-5 degenerative disc disease.  Grade 1 spondylolisthesis L5 on S1.  No acute fractures or acute findings.  XR Shoulder Left Result Date: 04/14/2024 Left shoulder 3 views: No acute fractures.  Shoulder is well located.  Mild glenohumeral arthritic changes with periarticular spurring.  Otherwise no acute findings.    PMFS History: Patient Active Problem List   Diagnosis Date Noted   Carpal tunnel syndrome of right wrist 11/09/2023   Chronic pain syndrome 08/03/2023   Unilateral primary osteoarthritis, right knee 12/10/2018   Status post total right knee replacement 12/10/2018   Long term (current) use of anticoagulants 08/16/2018   Hypertensive cardiovascular disease 06/04/2018   Morbid obesity with BMI of 40.0-44.9, adult (HCC) 06/04/2018   Fatigue 06/04/2018   Chronic anticoagulation    PAF (paroxysmal atrial fibrillation) (HCC) 05/02/2018   Primary osteoarthritis of right knee 06/14/2017   OSA (obstructive sleep apnea) 02/13/2017   Cervical radiculopathy 01/02/2017   NIDDM-type 2 10/31/2016   Essential hypertension 08/02/2016   Anxiety and depression 08/02/2016   Insomnia 08/02/2016   Arthritis of left knee 10/13/2014   Status post total left knee replacement 10/13/2014   Past Medical History:  Diagnosis Date   Anxiety    Arthritis    knees; right thumb (10/14/2014)   Basal cell carcinoma    burned off  upper lip & right shoulder   Chronic anticoagulation    CHADS VASC=3   Complication of anesthesia    after spinal anesthesia for left knee replacement, bladder did not wake up. Was incontinent for 4 days post surgery   Depression    Fibroid tumor    Frequent diarrhea    GERD (gastroesophageal reflux disease)    High cholesterol    Hypertension    EF 65-70% grade 2DD   Insomnia    Migraine    frequently when I was younger; maybe monthly now (10/14/2014) - none after hysterectomy   PAF (paroxysmal atrial fibrillation) (HCC) 05/02/2018   converted with rate control   Pernicious anemia    pt not aware of this   Sleep apnea    uses a cpap   Type II diabetes mellitus (HCC)    Vitamin B 12 deficiency     Family History  Problem Relation Age of Onset   Arthritis Mother    Hyperlipidemia Mother    Diabetes Mother    Hypertension Mother  Arthritis Father    Hyperlipidemia Father    Heart disease Father    Diabetes Father    Stroke Brother    Hypertension Brother    Diabetes Brother    Arthritis Maternal Grandmother    Hyperlipidemia Maternal Grandmother    Diabetes Maternal Grandmother    Hypertension Maternal Grandmother    Arthritis Maternal Grandfather    Hyperlipidemia Maternal Grandfather    Hypertension Maternal Grandfather    Arthritis Paternal Grandmother    Hyperlipidemia Paternal Grandmother    Diabetes Paternal Grandmother    Hypertension Paternal Grandmother    Arthritis Paternal Grandfather    Hyperlipidemia Paternal Grandfather    Diabetes Paternal Grandfather    Hypertension Paternal Grandfather     Past Surgical History:  Procedure Laterality Date   ABDOMINAL HYSTERECTOMY  1988   ACHILLES TENDON SURGERY Right    APPENDECTOMY  1988   BILATERAL OOPHORECTOMY  2004   CARPAL TUNNEL RELEASE Bilateral 1986   CARPAL TUNNEL RELEASE Right 11/09/2023   Procedure: RIGHT CARPAL TUNNEL RELEASE;  Surgeon: Arlinda Buster, MD;  Location: [facility];  Service: Orthopedics;  Laterality: Right;   CATARACT EXTRACTION W/ INTRAOCULAR LENS  IMPLANT, BILATERAL Bilateral 2011   COLONOSCOPY     HYPOTHENAR FAT PAD TRANSFER Right 11/09/2023   Procedure: RIGHT HYPOTHENAR FAT PAD TRANSFER;  Surgeon: Arlinda Buster, MD;  Location: [facility];  Service: Orthopedics;  Laterality: Right;   JOINT REPLACEMENT     SHOULDER ARTHROSCOPY DISTAL CLAVICLE EXCISION AND OPEN ROTATOR CUFF REPAIR Right 11/2013   TOENAIL EXCISION Right 04/2018   big toe   TONSILLECTOMY  1950's   TOTAL KNEE ARTHROPLASTY Left 10/13/2014   Procedure: LEFT TOTAL KNEE ARTHROPLASTY;  Surgeon: Lonni CINDERELLA Poli, MD;  Location: [facility] OR;  Service: Orthopedics;  Laterality: Left;   TOTAL KNEE ARTHROPLASTY Right 12/10/2018   Procedure: RIGHT TOTAL KNEE ARTHROPLASTY;  Surgeon: Poli Lonni CINDERELLA, MD;  Location: [facility] OR;  Service: Orthopedics;  Laterality: Right;   Social History   Occupational History   Not on file  Tobacco Use   Smoking status: Never   Smokeless tobacco: Never  Vaping Use   Vaping status: Never Used  Substance and Sexual Activity   Alcohol use: Yes    Comment: 10/14/2014 might have a drink when I'm out once/month   Drug use: No   Sexual activity: Not Currently    Birth control/protection: Surgical    Comment: hyst

## 2024-04-24 ENCOUNTER — Ambulatory Visit (INDEPENDENT_AMBULATORY_CARE_PROVIDER_SITE_OTHER): Payer: Self-pay

## 2024-04-24 DIAGNOSIS — Z5181 Encounter for therapeutic drug level monitoring: Secondary | ICD-10-CM | POA: Diagnosis not present

## 2024-04-24 LAB — POCT INR: INR: 4.3 — AB (ref 2.0–3.0)

## 2024-04-24 NOTE — Progress Notes (Signed)
 INR 4.3; Please see anticoagulation encounter

## 2024-04-24 NOTE — Patient Instructions (Signed)
 Description   Spoke with patient and instructed her to Hold warfarin today and then START taking Warfarin 1/2 tablet (2.5mg ) daily except 1 tablet (5mg ) on Fridays.   Stay consistent with greens (3 per week). Recheck INR in 2 weeks  Anticoagulation Clinic [phone]

## 2024-04-27 ENCOUNTER — Telehealth: Payer: Self-pay | Admitting: Orthopedic Surgery

## 2024-04-27 NOTE — Telephone Encounter (Signed)
 Patient contact for follow-up regarding right open carpal tunnel release, revision with hypothenar fat flap from February 7 of this year.  She was seen for her 80-month follow-up visit and noted to have significant improvement in her numbness and tingling of the right hand.  She did have some postoperative erythema which resolved with conservative treatments.  She also had reported lateral epicondylitis in the left elbow for which she underwent counterforce bracing and cortisone injection in May of this year.  States she is doing well status post carpal tunnel release, she will be returning to our office in the August timeframe for follow-up once again.  At that juncture, we will reexamine the lateral epicondylitis as well as evaluate what sounds like a developing trigger thumb in the right hand.  Matthieu Loftus, MD Hand Surgery, [place]

## 2024-05-01 DIAGNOSIS — I10 Essential (primary) hypertension: Secondary | ICD-10-CM | POA: Diagnosis not present

## 2024-05-01 DIAGNOSIS — I480 Paroxysmal atrial fibrillation: Secondary | ICD-10-CM | POA: Diagnosis not present

## 2024-05-01 DIAGNOSIS — I4821 Permanent atrial fibrillation: Secondary | ICD-10-CM | POA: Diagnosis not present

## 2024-05-01 DIAGNOSIS — E1169 Type 2 diabetes mellitus with other specified complication: Secondary | ICD-10-CM | POA: Diagnosis not present

## 2024-05-01 DIAGNOSIS — I350 Nonrheumatic aortic (valve) stenosis: Secondary | ICD-10-CM | POA: Diagnosis not present

## 2024-05-05 ENCOUNTER — Ambulatory Visit: Admitting: Physician Assistant

## 2024-05-08 ENCOUNTER — Ambulatory Visit (INDEPENDENT_AMBULATORY_CARE_PROVIDER_SITE_OTHER): Payer: Self-pay

## 2024-05-08 DIAGNOSIS — Z5181 Encounter for therapeutic drug level monitoring: Secondary | ICD-10-CM

## 2024-05-08 LAB — POCT INR: INR: 2.8 (ref 2.0–3.0)

## 2024-05-08 NOTE — Patient Instructions (Signed)
 Description   Spoke with patient and instructed her to continue taking Warfarin 1/2 tablet (2.5mg ) daily except 1 tablet (5mg ) on Fridays.   Stay consistent with greens (3 per week). Recheck INR in 2 weeks  Anticoagulation Clinic [phone]

## 2024-05-08 NOTE — Progress Notes (Signed)
 INR-2.8 Please see anticoagulation encounter

## 2024-05-14 ENCOUNTER — Ambulatory Visit: Admitting: Podiatry

## 2024-05-14 ENCOUNTER — Encounter: Payer: Self-pay | Admitting: Podiatry

## 2024-05-14 DIAGNOSIS — E1151 Type 2 diabetes mellitus with diabetic peripheral angiopathy without gangrene: Secondary | ICD-10-CM

## 2024-05-14 DIAGNOSIS — L84 Corns and callosities: Secondary | ICD-10-CM | POA: Diagnosis not present

## 2024-05-14 DIAGNOSIS — M2041 Other hammer toe(s) (acquired), right foot: Secondary | ICD-10-CM | POA: Diagnosis not present

## 2024-05-15 NOTE — Progress Notes (Signed)
 Subjective:   Patient ID: Debra Wright, female   DOB: 73 y.o.   MRN: [id number]   HPI Patient presents with long-term diabetes under fair control of A1c 8.2 with lesion of 2nd and 3rd metatarsal and distal 2nd and 3rd toe that become painful and she does have reduced circulatory flow but upon extensive questioning does not appear to have extensive claudication.  Patient does not smoke likes to be active has been dealing with diabetes for approximately 7 to 8 years   Review of Systems  All other systems reviewed and are negative.       Objective:  Physical Exam Vitals and nursing note reviewed.  Constitutional:      Appearance: She is well-developed.  Pulmonary:     Effort: Pulmonary effort is normal.  Musculoskeletal:        General: Normal range of motion.  Skin:    General: Skin is warm.  Neurological:     Mental Status: She is alert.     Vascular status indicates that there is diminishment of pulses DP PT but intact with digits found to be warm well-perfused.  There is noted to be digital deformity of the lesser toes right with keratotic tissue formation distal toes and small on the metatarsal heads 2 and 3.  Patient has good neurological sensation sharp dull vibratory currently and has good mental status      Assessment:  Moderate vascular disease associated with diabetes that is moderately elevated with lesions that are discomforting and she tries to wear cushioning padding but still can be bothersome and scared to cut herself due to risk     Plan:  H&P reviewed diabetes and to get the A1c down from where it sat and the importance of this.  At this point I went ahead I did sharp debridement of lesions right foot and patient tolerated this well and I discussed daily inspections of her feet and will review return if symptoms recur.  Digital deformities do not recommend treating at the current time except for using the cushioning padding that she has been using

## 2024-05-22 ENCOUNTER — Ambulatory Visit (INDEPENDENT_AMBULATORY_CARE_PROVIDER_SITE_OTHER): Payer: Self-pay

## 2024-05-22 ENCOUNTER — Encounter: Payer: Self-pay | Admitting: Orthopedic Surgery

## 2024-05-22 ENCOUNTER — Telehealth: Payer: Self-pay | Admitting: Orthopedic Surgery

## 2024-05-22 DIAGNOSIS — I480 Paroxysmal atrial fibrillation: Secondary | ICD-10-CM | POA: Diagnosis not present

## 2024-05-22 DIAGNOSIS — Z5181 Encounter for therapeutic drug level monitoring: Secondary | ICD-10-CM

## 2024-05-22 LAB — POCT INR: INR: 3.6 — AB (ref 2.0–3.0)

## 2024-05-22 NOTE — Progress Notes (Signed)
 INR 3.6  Please see anticoagulation encounter

## 2024-05-22 NOTE — Telephone Encounter (Signed)
 Called pt to inform her that her appointment 8/25 was cancelled but her future appointment is 9/15 not 9/4

## 2024-05-22 NOTE — Patient Instructions (Signed)
 Description   Spoke with patient and instructed her to HOLD today's dose and then continue taking Warfarin 1/2 tablet (2.5mg ) daily except 1 tablet (5mg ) on Fridays.   Stay consistent with greens (3 per week). Recheck INR in 2 weeks  Anticoagulation Clinic [phone]

## 2024-05-22 NOTE — Telephone Encounter (Signed)
 Pt returned call to [place] and said 9/15 @ 3:15 pm is fine

## 2024-05-26 ENCOUNTER — Encounter: Admitting: Orthopedic Surgery

## 2024-06-01 DIAGNOSIS — I480 Paroxysmal atrial fibrillation: Secondary | ICD-10-CM | POA: Diagnosis not present

## 2024-06-01 DIAGNOSIS — E1169 Type 2 diabetes mellitus with other specified complication: Secondary | ICD-10-CM | POA: Diagnosis not present

## 2024-06-01 DIAGNOSIS — I10 Essential (primary) hypertension: Secondary | ICD-10-CM | POA: Diagnosis not present

## 2024-06-01 DIAGNOSIS — I350 Nonrheumatic aortic (valve) stenosis: Secondary | ICD-10-CM | POA: Diagnosis not present

## 2024-06-01 DIAGNOSIS — I4821 Permanent atrial fibrillation: Secondary | ICD-10-CM | POA: Diagnosis not present

## 2024-06-03 ENCOUNTER — Telehealth: Payer: Self-pay

## 2024-06-03 ENCOUNTER — Telehealth: Payer: Self-pay | Admitting: *Deleted

## 2024-06-03 NOTE — Telephone Encounter (Addendum)
 Spoke with patient regarding the INR' that were faxed over since the record does not line up with reported INR encounters. She stated she did call them to report INR's today and she states INR on 8/21 was 3.6, INR on 04/24/24 was 4.6 but reported to us  4.3 and then reported to them 5.2 advised when she reports to she must do in real time and with machine in hand with the results showing otherwise she will need to come into the office for INR checks. Confirmed INR results to fix with [employer]. She states she is due 9/19 for next check and advised she is due on 06/05/24 and it needs to be done. She states she is writing next check date down at this time to ensure she gets it done.   Spoke with [employer] and corrected INR results with dates not reported and the correct INR results that patient confirmed while on the phone with me with her machine. The results will be updated per [employer] and refaxed to us  to place in chart.

## 2024-06-03 NOTE — Telephone Encounter (Signed)
 Lpmtcb to discuss INR that was faxed by [employer].  Not sure if INR is from today or the past.

## 2024-06-04 ENCOUNTER — Ambulatory Visit (INDEPENDENT_AMBULATORY_CARE_PROVIDER_SITE_OTHER): Admitting: Orthopedic Surgery

## 2024-06-04 DIAGNOSIS — M65311 Trigger thumb, right thumb: Secondary | ICD-10-CM | POA: Diagnosis not present

## 2024-06-04 DIAGNOSIS — M79641 Pain in right hand: Secondary | ICD-10-CM | POA: Diagnosis not present

## 2024-06-04 DIAGNOSIS — M65351 Trigger finger, right little finger: Secondary | ICD-10-CM

## 2024-06-04 DIAGNOSIS — M65321 Trigger finger, right index finger: Secondary | ICD-10-CM

## 2024-06-04 DIAGNOSIS — Z9889 Other specified postprocedural states: Secondary | ICD-10-CM | POA: Diagnosis not present

## 2024-06-04 NOTE — Progress Notes (Signed)
 Debra Wright - 73 y.o. female MRN [id number]  Date of birth: [birth year]  Office Visit Note: Visit Date: 06/04/2024 PCP: Chrystal Lamarr RAMAN, MD Referred by: Chrystal Lamarr RAMAN, *  Subjective:  HPI: Debra Wright is a 73 y.o. female who returns today for ongoing swelling throughout the right hand with clicking and catching of the right thumb, index and small finger.  Of note, she is status post right wrist revision carpal tunnel release with hypothenar fat pad transfer from February of this year.  She is very pleased with her progress with the right hand, numbness and tingling has improved dramatically.   Pertinent ROS were reviewed with the patient and found to be negative unless otherwise specified above in HPI.   Assessment & Plan: Visit Diagnoses:  1. Pain in right hand   2. S/p bilateral carpal tunnel release   3. Trigger thumb, right thumb   4. Trigger index finger of right hand   5. Trigger little finger of right hand      Plan: I am very pleased to see how well she is doing status post right wrist revision carpal tunnel release with hypothenar fat pad transfer.  Her numbness and tingling has improved dramatically, her 2-point discrimination in the median nerve distribution of the right hand is between 5 to 6 mm which is excellent.  Extensive discussion was had with the patient today regarding her right thumb, index and small finger trigger digit.  We discussed the etiology and pathophysiology of stenosing tenosynovitis.  We discussed conservative versus surgical treatment modalities.  From a conservative standpoint, we discussed activity modification, splinting, therapy and injections.  From a surgical standpoint, we discussed the possibility for trigger digit release as well as all risk and benefits associated.  Given that she has not trialed conservative treatments, patient is appropriate candidate for cortisone injection to the right thumb, index and small finger A1  pulley for symptom relief.  Risks and benefit of the cortisone injection were discussed in detail.  We did discuss the importance of glucose control after multiple injections as well given her history of diabetes.  As an alternative, she did inquire about the possibility of progressing to right thumb, index and small finger trigger digit release.  We discussed the possible efficacy of the injections based on the literature and her history of diabetes and multiple trigger digits.  She would like to give this some thought and will return to me in the near future for further discussion.   Follow-up: No follow-ups on file.   Meds & Orders: No orders of the defined types were placed in this encounter.   No orders of the defined types were placed in this encounter.    Procedures: No procedures performed       Objective:   Vital Signs: There were no vitals taken for this visit.  Ortho Exam Right wrist with well-healed volar incision stemming from the palmar aspect of the hand into the forearm, no erythema or drainage, composite fist without restriction, sensations intact light touch in the median nerve distribution, 2-point discrimination is between 5 to 7 mm throughout the median nerve distribution of the right hand  Palpable nodules to the right thumb, index and small finger with associated tenderness, there is notable clicking and locking of the index finger, stiffness of the thumb and small finger at the MP and PIP regions  Imaging: No results found.    Deshanna Kama Afton Alderton, M.D. [facility], Hand Surgery

## 2024-06-05 ENCOUNTER — Ambulatory Visit (INDEPENDENT_AMBULATORY_CARE_PROVIDER_SITE_OTHER): Payer: Self-pay | Admitting: *Deleted

## 2024-06-05 ENCOUNTER — Telehealth: Payer: Self-pay | Admitting: *Deleted

## 2024-06-05 DIAGNOSIS — I480 Paroxysmal atrial fibrillation: Secondary | ICD-10-CM

## 2024-06-05 DIAGNOSIS — Z7901 Long term (current) use of anticoagulants: Secondary | ICD-10-CM

## 2024-06-05 LAB — POCT INR: INR: 3.4 — AB (ref 2.0–3.0)

## 2024-06-05 NOTE — Patient Instructions (Addendum)
 Description   INR-3.4; Spoke with patient and instructed her to not take any warfarin today then START taking Warfarin 1/2 tablet (2.5mg ) daily. Stay consistent with greens (3 per week). Recheck INR in 2 weeks  Anticoagulation Clinic [phone]

## 2024-06-05 NOTE — Progress Notes (Signed)
 Description   INR-3.4; Spoke with patient and instructed her to not take any warfarin today then START taking Warfarin 1/2 tablet (2.5mg ) daily. Stay consistent with greens (3 per week). Recheck INR in 2 weeks  Anticoagulation Clinic [phone]

## 2024-06-05 NOTE — Telephone Encounter (Signed)
 Called patient since INR is due; had to leave a message.

## 2024-06-09 ENCOUNTER — Encounter: Payer: Self-pay | Admitting: Orthopedic Surgery

## 2024-06-10 NOTE — Telephone Encounter (Signed)
 Called pt to let her know that Debra Wright would be reaching out to schedule her

## 2024-06-10 NOTE — Telephone Encounter (Signed)
 Do you want to see her back or make a sheet?

## 2024-06-16 ENCOUNTER — Other Ambulatory Visit: Payer: Self-pay

## 2024-06-16 ENCOUNTER — Ambulatory Visit (INDEPENDENT_AMBULATORY_CARE_PROVIDER_SITE_OTHER): Admitting: Orthopedic Surgery

## 2024-06-16 DIAGNOSIS — M65351 Trigger finger, right little finger: Secondary | ICD-10-CM

## 2024-06-16 DIAGNOSIS — M65311 Trigger thumb, right thumb: Secondary | ICD-10-CM | POA: Diagnosis not present

## 2024-06-16 DIAGNOSIS — M65321 Trigger finger, right index finger: Secondary | ICD-10-CM

## 2024-06-16 NOTE — Progress Notes (Signed)
   Debra Wright - 73 y.o. female MRN [id number]  Date of birth: 1951-07-19  Office Visit Note: Visit Date: 06/16/2024 PCP: Chrystal Lamarr RAMAN, MD Referred by: Chrystal Lamarr RAMAN, *  Subjective:  HPI: Debra Wright is a 73 y.o. female who returns today for ongoing swelling throughout the right hand with clicking and catching of the right thumb, index and small finger.  Of note, she is status post right wrist revision carpal tunnel release with hypothenar fat pad transfer from February of this year.  She is very pleased with her progress with the right hand, numbness and tingling has improved dramatically.   Pertinent ROS were reviewed with the patient and found to be negative unless otherwise specified above in HPI.   Assessment & Plan: Visit Diagnoses:  1. Trigger thumb, right thumb   2. Trigger index finger of right hand   3. Trigger little finger of right hand       Plan: I am very pleased to see how well she is doing status post right wrist revision carpal tunnel release with hypothenar fat pad transfer.  Her numbness and tingling has improved dramatically, her 2-point discrimination in the median nerve distribution of the right hand is between 5 to 6 mm which is excellent.  Extensive discussion was had with the patient today regarding her right thumb, index and small finger trigger digit.  We discussed the etiology and pathophysiology of stenosing tenosynovitis.  We discussed conservative versus surgical treatment modalities.  From a conservative standpoint, we discussed activity modification, splinting, therapy and injections.  From a surgical standpoint, we discussed the possibility for trigger digit release as well as all risk and benefits associated.  At this juncture, she would like to proceed with right thumb, index and small finger trigger digit release.  We discussed the possible efficacy of the injections based on the literature and her history of diabetes and  multiple trigger digits.  Understanding her options, she would like to proceed with surgical intervention.  Risks and benefits of the procedure were discussed, risks including but not limited to infection, bleeding, scarring, stiffness, nerve injury, tendon injury, vascular injury, recurrence of symptoms and need for subsequent operation.  We also discussed the appropriate postoperative protocol and timeframe for return to activities and function.  Patient expressed understanding.      Follow-up: No follow-ups on file.   Meds & Orders: No orders of the defined types were placed in this encounter.   No orders of the defined types were placed in this encounter.    Procedures: No procedures performed       Objective:   Vital Signs: There were no vitals taken for this visit.  Ortho Exam Right wrist with well-healed volar incision stemming from the palmar aspect of the hand into the forearm, no erythema or drainage, composite fist without restriction, sensations intact light touch in the median nerve distribution, 2-point discrimination is between 5 to 7 mm throughout the median nerve distribution of the right hand  Palpable nodules to the right thumb, index and small finger with associated tenderness, there is notable clicking and locking of the index finger, stiffness of the thumb and small finger at the MP and PIP regions  Imaging: No results found.    Debra Wright, M.D. [facility], Hand Surgery

## 2024-06-17 DIAGNOSIS — G4733 Obstructive sleep apnea (adult) (pediatric): Secondary | ICD-10-CM | POA: Diagnosis not present

## 2024-06-17 DIAGNOSIS — F5104 Psychophysiologic insomnia: Secondary | ICD-10-CM | POA: Diagnosis not present

## 2024-06-17 DIAGNOSIS — F410 Panic disorder [episodic paroxysmal anxiety] without agoraphobia: Secondary | ICD-10-CM | POA: Diagnosis not present

## 2024-06-17 DIAGNOSIS — Z23 Encounter for immunization: Secondary | ICD-10-CM | POA: Diagnosis not present

## 2024-06-17 DIAGNOSIS — I350 Nonrheumatic aortic (valve) stenosis: Secondary | ICD-10-CM | POA: Diagnosis not present

## 2024-06-17 DIAGNOSIS — E1165 Type 2 diabetes mellitus with hyperglycemia: Secondary | ICD-10-CM | POA: Diagnosis not present

## 2024-06-17 DIAGNOSIS — I480 Paroxysmal atrial fibrillation: Secondary | ICD-10-CM | POA: Diagnosis not present

## 2024-06-17 DIAGNOSIS — F331 Major depressive disorder, recurrent, moderate: Secondary | ICD-10-CM | POA: Diagnosis not present

## 2024-06-19 ENCOUNTER — Ambulatory Visit (INDEPENDENT_AMBULATORY_CARE_PROVIDER_SITE_OTHER): Admitting: Cardiology

## 2024-06-19 ENCOUNTER — Other Ambulatory Visit: Payer: Self-pay

## 2024-06-19 ENCOUNTER — Telehealth: Payer: Self-pay

## 2024-06-19 ENCOUNTER — Telehealth: Payer: Self-pay | Admitting: *Deleted

## 2024-06-19 DIAGNOSIS — M65311 Trigger thumb, right thumb: Secondary | ICD-10-CM

## 2024-06-19 DIAGNOSIS — M65351 Trigger finger, right little finger: Secondary | ICD-10-CM

## 2024-06-19 DIAGNOSIS — M65321 Trigger finger, right index finger: Secondary | ICD-10-CM

## 2024-06-19 DIAGNOSIS — I480 Paroxysmal atrial fibrillation: Secondary | ICD-10-CM

## 2024-06-19 DIAGNOSIS — Z7901 Long term (current) use of anticoagulants: Secondary | ICD-10-CM

## 2024-06-19 LAB — POCT INR: INR: 2.5 (ref 2.0–3.0)

## 2024-06-19 NOTE — Telephone Encounter (Addendum)
 Pt called to inform us  that she is having a procedure on her hand 07/03/2024 by Dr. Erwin  ( Dr. Gildardo Alderton phone # [phone])  Pt stated that she thought she would need to hold warfarin 7 days prior. Informed pt that I do not see a clearance request and we would need one to make sure she was safe if coming off warfarin.

## 2024-06-19 NOTE — Telephone Encounter (Signed)
 [facility] from [facility] called. States patient is having surgery  07/03/2024 by Dr. Erwin. Would need clearance form sent to them Requesting to stop Warfarin.    Phone: [phone] Fax: [phone]

## 2024-06-19 NOTE — Telephone Encounter (Signed)
 Called and spoke to Debra Wright,  from [facility] who confirmed that pt has procedure on 10/2.   Informed her that if pt is needing to hold warfarin than we will need to have a clearance form faxed over and gave her the fax number [phone] to our preop team.

## 2024-06-19 NOTE — Progress Notes (Signed)
 INR 2.5 Please see anticoagulation encounter continue taking Warfarin 1/2 tablet (2.5mg ) daily. Stay consistent with greens (3 per week). Recheck INR in 2 weeks  Anticoagulation Clinic [phone]

## 2024-06-20 ENCOUNTER — Telehealth: Payer: Self-pay

## 2024-06-20 NOTE — Telephone Encounter (Signed)
   Pre-operative Risk Assessment    Patient Name: Debra Wright  DOB: 08/19/1951 MRN: [id number]   Date of last office visit: 02/06/24 Date of next office visit: 02/05/25   Request for Surgical Clearance    Procedure:  Right thumb trigger digit release, right index and small  Date of Surgery:  Clearance 07/02/24                                Surgeon:  Gildardo Alderton, MD Surgeon's Group or Practice Name:  [facility] Phone number:  [phone] Fax number:  [phone]   Type of Clearance Requested:   - Medical  - Pharmacy:  Hold Warfarin (Coumadin ) x5 days   Type of Anesthesia:  Not Indicated   Additional requests/questions:    [name]   06/20/2024, 4:39 PM

## 2024-06-20 NOTE — Telephone Encounter (Signed)
 Surgical clearance request to Cardiologist for patient to hold Warfarin 5 days prior to surgery was faxed 06/20/24. Requested a response ASAP.

## 2024-06-23 ENCOUNTER — Other Ambulatory Visit

## 2024-06-23 DIAGNOSIS — E11649 Type 2 diabetes mellitus with hypoglycemia without coma: Secondary | ICD-10-CM | POA: Diagnosis not present

## 2024-06-24 LAB — COMPREHENSIVE METABOLIC PANEL WITH GFR
AG Ratio: 1.9 (calc) (ref 1.0–2.5)
ALT: 6 U/L (ref 6–29)
AST: 15 U/L (ref 10–35)
Albumin: 3.7 g/dL (ref 3.6–5.1)
Alkaline phosphatase (APISO): 53 U/L (ref 37–153)
BUN: 9 mg/dL (ref 7–25)
CO2: 27 mmol/L (ref 20–32)
Calcium: 8.8 mg/dL (ref 8.6–10.4)
Chloride: 102 mmol/L (ref 98–110)
Creat: 0.71 mg/dL (ref 0.60–1.00)
Globulin: 2 g/dL (ref 1.9–3.7)
Glucose, Bld: 81 mg/dL (ref 65–99)
Potassium: 4.1 mmol/L (ref 3.5–5.3)
Sodium: 139 mmol/L (ref 135–146)
Total Bilirubin: 0.5 mg/dL (ref 0.2–1.2)
Total Protein: 5.7 g/dL — ABNORMAL LOW (ref 6.1–8.1)
eGFR: 89 mL/min/1.73m2 (ref >=60)

## 2024-06-24 LAB — LIPID PANEL
Cholesterol: 77 mg/dL (ref <200)
HDL: 28 mg/dL — ABNORMAL LOW (ref >=50)
LDL Cholesterol (Calc): 24 mg/dL
Non-HDL Cholesterol (Calc): 49 mg/dL (ref <130)
Total CHOL/HDL Ratio: 2.8 (calc) (ref <5.0)
Triglycerides: 178 mg/dL — ABNORMAL HIGH (ref <150)

## 2024-06-24 LAB — HEMOGLOBIN A1C
Hgb A1c MFr Bld: 7 % — ABNORMAL HIGH (ref <5.7)
Mean Plasma Glucose: 154 mg/dL
eAG (mmol/L): 8.5 mmol/L

## 2024-06-26 DIAGNOSIS — F331 Major depressive disorder, recurrent, moderate: Secondary | ICD-10-CM | POA: Insufficient documentation

## 2024-06-26 DIAGNOSIS — F419 Anxiety disorder, unspecified: Secondary | ICD-10-CM | POA: Insufficient documentation

## 2024-06-26 DIAGNOSIS — I700 Atherosclerosis of aorta: Secondary | ICD-10-CM | POA: Insufficient documentation

## 2024-06-26 DIAGNOSIS — E2839 Other primary ovarian failure: Secondary | ICD-10-CM | POA: Insufficient documentation

## 2024-06-26 DIAGNOSIS — I359 Nonrheumatic aortic valve disorder, unspecified: Secondary | ICD-10-CM | POA: Insufficient documentation

## 2024-06-26 DIAGNOSIS — E781 Pure hyperglyceridemia: Secondary | ICD-10-CM | POA: Insufficient documentation

## 2024-06-26 DIAGNOSIS — N3281 Overactive bladder: Secondary | ICD-10-CM | POA: Insufficient documentation

## 2024-06-26 DIAGNOSIS — E1165 Type 2 diabetes mellitus with hyperglycemia: Secondary | ICD-10-CM | POA: Insufficient documentation

## 2024-06-26 LAB — POCT INR: INR: 2.8 (ref 2.0–3.0)

## 2024-06-26 NOTE — Telephone Encounter (Signed)
 Patient with diagnosis of PAF on warfarin for anticoagulation.    Procedure: Right thumb trigger digit release, right index and small  Date of procedure: 07/02/24   CHA2DS2-VASc Score = 6  This indicates a 9.7% annual risk of stroke. The patient's score is based upon: CHF History: 0 HTN History: 1 Diabetes History: 1 Stroke History: 0 Vascular Disease History: 1 Age Score: 2 Gender Score: 1     CrCl 113 ml/min Platelet count 295K  Patient has not had an Afib/aflutter ablation or Watchman within the last 3 months or DCCV within the last 30 days    Per office protocol, patient can hold warfarin for 5 days prior to procedure.    Patient will not need bridging with Lovenox (enoxaparin) around procedure.  **This guidance is not considered finalized until pre-operative APP has relayed final recommendations.**

## 2024-06-26 NOTE — Telephone Encounter (Signed)
   Name: Debra Wright  DOB: Mar 10, 1951  MRN: [id number]  Primary Cardiologist: Lonni LITTIE Nanas, MD   Preoperative team, please contact this patient and set up a phone call appointment for further preoperative risk assessment. Please obtain consent and complete medication review. Thank you for your help.  I confirm that guidance regarding antiplatelet and oral anticoagulation therapy has been completed and, if necessary, noted below.  Patient has not had an Afib/aflutter ablation or Watchman within the last 3 months or DCCV within the last 30 days      Per office protocol, patient can hold warfarin for 5 days prior to procedure.     Patient will not need bridging with Lovenox (enoxaparin) around procedure.  I also confirmed the patient resides in the state of [place] . As per [facility] Medical Board telemedicine laws, the patient must reside in the state in which the provider is licensed.   [name], NP 06/26/2024, 12:39 PM [facility]

## 2024-06-26 NOTE — Telephone Encounter (Signed)
 Left message for the pt to call back ash she will need to be added on to preop schedule tomorrow due to procedure date and med hold.

## 2024-06-27 ENCOUNTER — Telehealth (INDEPENDENT_AMBULATORY_CARE_PROVIDER_SITE_OTHER): Admitting: Emergency Medicine

## 2024-06-27 ENCOUNTER — Telehealth: Payer: Self-pay

## 2024-06-27 ENCOUNTER — Encounter: Payer: Self-pay | Admitting: "Endocrinology

## 2024-06-27 ENCOUNTER — Ambulatory Visit (INDEPENDENT_AMBULATORY_CARE_PROVIDER_SITE_OTHER): Payer: Self-pay | Admitting: *Deleted

## 2024-06-27 DIAGNOSIS — Z01810 Encounter for preprocedural cardiovascular examination: Secondary | ICD-10-CM

## 2024-06-27 DIAGNOSIS — I480 Paroxysmal atrial fibrillation: Secondary | ICD-10-CM

## 2024-06-27 DIAGNOSIS — Z7901 Long term (current) use of anticoagulants: Secondary | ICD-10-CM

## 2024-06-27 NOTE — Patient Instructions (Addendum)
 Description   INR-2.8; Spoke with patient and advised to continue taking Warfarin 1/2 tablet (2.5mg ) daily. Stay consistent with greens (3 per week). Recheck INR in 1 week post procedure (ST normally 2 weeks)  Anticoagulation Clinic [phone]   Advised approved to hold 5 days for procedure: Right thumb trigger digit release, right index and small Date of procedure: 07/02/24-per clearance and pt states it is 07/03/24-she will confirm with surgeon Once she resumes warfarin she can should take an extra half tablet of warfarin for two days then resume normal and check INR in 1 week post procedure.

## 2024-06-27 NOTE — Progress Notes (Signed)
 Virtual Visit via Telephone Note   Because of Debra Wright co-morbid illnesses, she is at least at moderate risk for complications without adequate follow up.  This format is felt to be most appropriate for this patient at this time.  Due to technical limitations with video connection (technology), today's appointment will be conducted as an audio only telehealth visit, and Debra Wright verbally agreed to proceed in this manner.   All issues noted in this document were discussed and addressed.  No physical exam could be performed with this format.  Evaluation Performed:  Preoperative cardiovascular risk assessment _____________   Date:  06/27/2024   Patient ID:  Debra Wright, DOB Aug 30, 1951, MRN [id number] Patient Location:  Home Provider location:   Office  Primary Care Provider:  Chrystal Lamarr RAMAN, MD Primary Cardiologist:  Debra LITTIE Nanas, MD  Chief Complaint / Patient Profile   73 y.o. y/o female with a h/o longstanding persistent atrial fibrillation, hypertension, mild aortic stenosis, obstructive sleep apnea, type 2 diabetes, hyperlipidemia, obesity who is pending right thumb trigger digit release, right index and small on 07/02/2024 with [facility] at [facility] and presents today for telephonic preoperative cardiovascular risk assessment.  History of Present Illness    Debra Wright is a 73 y.o. female who presents via audio/video conferencing for a telehealth visit today.  Pt was last seen in cardiology clinic on 02/06/2024 by Dr. Burnard.  At that time Debra Wright was doing well.  The patient is now pending procedure as outlined above. Since her last visit, she denies chest pain, shortness of breath, lower extremity edema, fatigue, palpitations, melena, hematuria, hemoptysis, diaphoresis, weakness, presyncope, syncope, orthopnea, and PND.  Today patient is doing well overall.  She is without acute cardiovascular concerns today.  Her heart rate  today was 66 bpm while taking at home.  She denies any episodes of tachycardia.  She does stay compliant with her current medication regimen.  She denies any chest pains or dyspnea.  She stays relatively active which is limited due to her back pain for which she does receive injections regularly.  She uses a cane to get around at home.  She does complete moderate and home activities without exertional symptoms.  Overall no symptoms to suggest active angina.  She is able to complete greater than 4 METS.  Past Medical History    Past Medical History:  Diagnosis Date   Anxiety    Arthritis    knees; right thumb (10/14/2014)   Basal cell carcinoma    burned off upper lip & right shoulder   Chronic anticoagulation    CHADS VASC=3   Complication of anesthesia    after spinal anesthesia for left knee replacement, bladder did not wake up. Was incontinent for 4 days post surgery   Depression    Fibroid tumor    Frequent diarrhea    GERD (gastroesophageal reflux disease)    High cholesterol    Hypertension    EF 65-70% grade 2DD   Insomnia    Migraine    frequently when I was younger; maybe monthly now (10/14/2014) - none after hysterectomy   PAF (paroxysmal atrial fibrillation) (HCC) 05/02/2018   converted with rate control   Pernicious anemia    pt not aware of this   Sleep apnea    uses a cpap   Type II diabetes mellitus (HCC)    Vitamin B 12 deficiency    Past Surgical History:  Procedure  Laterality Date   ABDOMINAL HYSTERECTOMY  1988   ACHILLES TENDON SURGERY Right    APPENDECTOMY  1988   BILATERAL OOPHORECTOMY  2004   CARPAL TUNNEL RELEASE Bilateral 1986   CARPAL TUNNEL RELEASE Right 11/09/2023   Procedure: RIGHT CARPAL TUNNEL RELEASE;  Surgeon: Arlinda Buster, MD;  Location: [facility];  Service: Orthopedics;  Laterality: Right;   CATARACT EXTRACTION W/ INTRAOCULAR LENS  IMPLANT, BILATERAL Bilateral 2011   COLONOSCOPY     HYPOTHENAR FAT PAD TRANSFER  Right 11/09/2023   Procedure: RIGHT HYPOTHENAR FAT PAD TRANSFER;  Surgeon: Arlinda Buster, MD;  Location: [facility];  Service: Orthopedics;  Laterality: Right;   JOINT REPLACEMENT     SHOULDER ARTHROSCOPY DISTAL CLAVICLE EXCISION AND OPEN ROTATOR CUFF REPAIR Right 11/2013   TOENAIL EXCISION Right 04/2018   big toe   TONSILLECTOMY  1950's   TOTAL KNEE ARTHROPLASTY Left 10/13/2014   Procedure: LEFT TOTAL KNEE ARTHROPLASTY;  Surgeon: Debra Wright Poli, MD;  Location: [facility] OR;  Service: Orthopedics;  Laterality: Left;   TOTAL KNEE ARTHROPLASTY Right 12/10/2018   Procedure: RIGHT TOTAL KNEE ARTHROPLASTY;  Surgeon: Wright Debra CINDERELLA, MD;  Location: [facility] OR;  Service: Orthopedics;  Laterality: Right;    Allergies  Allergies  Allergen Reactions   Antivert  [Meclizine  Hcl] Other (See Comments)    makes me feel like my skin is falling off.  03/05/20 pt states she was placed on this a few months back and did not have a reaction   Meclizine  Other (See Comments)    makes me feel like my skin is falling off.  03/05/20 pt states she was placed on this a few months back and did not have a reaction Other reaction(s): jumping out of skin, jittery   Trazodone Hcl Other (See Comments)    Other reaction(s): nightmares   Zolpidem  Other (See Comments)    Reverse affect   Zolpidem  Tartrate     Other reaction(s): doing things in sleep unaware    Home Medications    Prior to Admission medications   Medication Sig Start Date End Date Taking? Authorizing Provider  Blood Glucose Monitoring Suppl (ACCU-CHEK GUIDE ME) w/Device KIT USE AS DIRECTED.  Dx E11.9. 11/10/20   Hilts, Michael, MD  calcium  carbonate (TUMS - DOSED IN MG ELEMENTAL CALCIUM ) 500 MG chewable tablet Chew 2 tablets by mouth daily as needed for indigestion or heartburn.    [provider]  Calcium  Carbonate-Vitamin D (CALCIUM  600 + D PO) Take 1 tablet by mouth daily.    [provider]  cetirizine  (ZYRTEC) 10 MG tablet Take 10 mg by mouth daily as needed for allergies.    [provider]  CINNAMON PO Take 2 tablets by mouth daily.    [provider]  clonazePAM  (KLONOPIN ) 0.5 MG tablet TAKE 1/2 TO 1 (ONE-HALF TO ONE) TABLET BY MOUTH AT BEDTIME AS NEEDED Patient taking differently: Take 0.25-0.5 mg by mouth daily as needed for anxiety. 11/30/20   Hilts, Ozell, MD  Cyanocobalamin  (VITAMIN B12) 1000 MCG TBCR Take 1,000 mcg by mouth daily.    [provider]  diltiazem  (CARDIZEM  CD) 180 MG 24 hr capsule TAKE ONE CAPSULE BY MOUTH ONCE A DAY 02/22/24   Burnard Debby LABOR, MD  diltiazem  (TIAZAC ) 180 MG 24 hr capsule Take 1 capsule (180 mg total) by mouth daily. 02/27/24   Burnard Debby LABOR, MD  escitalopram (LEXAPRO) 10 MG tablet 1/2 tablet once a day x 1 week, then increase to  1 tablet daily 05/10/22   [provider]  Eszopiclone  3 MG TABS Take 1 tablet (3 mg total) by mouth at bedtime as needed. Take immediately before bedtime 11/09/20   Hilts, Michael, MD  gabapentin  (NEURONTIN ) 100 MG capsule Take 1 capsule (100 mg total) by mouth daily. 12/05/23   Motwani, Komal, MD  glipiZIDE  (GLUCOTROL  XL) 5 MG 24 hr tablet Take 1 tablet (5 mg total) by mouth daily with breakfast. Hold if BG <80 04/14/24 07/13/24  Motwani, Komal, MD  glucose blood (ACCU-CHEK GUIDE) test strip CHECK BLOOD GLUCOSE IN THE MORNING AND AT BEDTIME.  E11.9 code. 05/22/22   [place], MD  magnesium  oxide (MAG-OX) 400 (241.3 Mg) MG tablet Take 400 mg by mouth daily.    [provider]  metFORMIN  (GLUCOPHAGE ) 1000 MG tablet TAKE 1 TABLET (1,000 MG TOTAL) BY MOUTH TWICE A DAY WITH FOOD 04/08/24   Dartha Ernst, MD  metoprolol  tartrate (LOPRESSOR ) 25 MG tablet Take 1 tablet (25 mg total) by mouth 2 (two) times daily. 01/21/24   Burnard Debby LABOR, MD  Omega-3 Fatty Acids (FISH OIL) 1200 MG CAPS Take 1,200 mg by mouth 2 (two) times daily.    [provider]  omeprazole  (PRILOSEC) 40 MG capsule Take 1  capsule by mouth once daily 12/09/20   Hilts, Michael, MD  oxybutynin (DITROPAN) 5 MG tablet Take 5 mg by mouth 2 (two) times daily.    [provider]  rosuvastatin  (CRESTOR ) 20 MG tablet Take 1 tablet (20 mg total) by mouth daily. 01/03/21 05/14/24  Burnard Debby LABOR, MD  spironolactone  (ALDACTONE ) 25 MG tablet TAKE HALF TABLET BY MOUTH DAILY 11/22/23   Burnard Debby LABOR, MD  tolterodine  (DETROL  LA) 4 MG 24 hr capsule Take 4 mg by mouth daily. 12/21/23   [provider]  vitamin C (ASCORBIC ACID) 250 MG tablet Take 250 mg by mouth daily.    [provider]  warfarin (COUMADIN ) 5 MG tablet TAKE ONE-HALF TO ONE TABLET BY MOUTH DAILY AS DIRECTED BY COUMADIN  CLINIC 01/21/24   Burnard Debby LABOR, MD    Physical Exam    Vital Signs:  Jaleyah Diane Ibrahim does not have vital signs available for review today.  Given telephonic nature of communication, physical exam is limited. AAOx3. NAD. Normal affect.  Speech and respirations are unlabored.  Accessory Clinical Findings    None  Assessment & Plan    1.  Preoperative Cardiovascular Risk Assessment: According to the Revised Cardiac Risk Index (RCRI), her Perioperative Risk of Major Cardiac Event is (%): 0.4. Her Functional Capacity in METs is: 5.07 according to the Duke Activity Status Index (DASI).  Therefore, based on ACC/AHA guidelines, patient would be at acceptable risk for the planned procedure without further cardiovascular testing.   The patient was advised that if she develops new symptoms prior to surgery to contact our office to arrange for a follow-up visit, and she verbalized understanding.  Per office protocol, patient can hold warfarin for 5 days prior to procedure.     Patient will not need bridging with Lovenox (enoxaparin) around procedure.  A copy of this note will be routed to requesting surgeon.  Time:   Today, I have spent 8 minutes with the patient with telehealth technology discussing medical history,  symptoms, and management plan.     Lum LITTIE Louis, NP  06/27/2024, 11:16 AM

## 2024-06-27 NOTE — Telephone Encounter (Signed)
  Patient Consent for Virtual Visit        Debra Wright has provided verbal consent on 06/27/2024 for a virtual visit (video or telephone).   CONSENT FOR VIRTUAL VISIT FOR:  Debra Wright  By participating in this virtual visit I agree to the following:  I hereby voluntarily request, consent and authorize [facility] and its employed or contracted physicians, physician assistants, nurse practitioners or other licensed health care professionals (the Practitioner), to provide me with telemedicine health care services (the "Services) as deemed necessary by the treating Practitioner. I acknowledge and consent to receive the Services by the Practitioner via telemedicine. I understand that the telemedicine visit will involve communicating with the Practitioner through live audiovisual communication technology and the disclosure of certain medical information by electronic transmission. I acknowledge that I have been given the opportunity to request an in-person assessment or other available alternative prior to the telemedicine visit and am voluntarily participating in the telemedicine visit.  I understand that I have the right to withhold or withdraw my consent to the use of telemedicine in the course of my care at any time, without affecting my right to future care or treatment, and that the Practitioner or I may terminate the telemedicine visit at any time. I understand that I have the right to inspect all information obtained and/or recorded in the course of the telemedicine visit and may receive copies of available information for a reasonable fee.  I understand that some of the potential risks of receiving the Services via telemedicine include:  Delay or interruption in medical evaluation due to technological equipment failure or disruption; Information transmitted may not be sufficient (e.g. poor resolution of images) to allow for appropriate medical decision making by the  Practitioner; and/or  In rare instances, security protocols could fail, causing a breach of personal health information.  Furthermore, I acknowledge that it is my responsibility to provide information about my medical history, conditions and care that is complete and accurate to the best of my ability. I acknowledge that Practitioner's advice, recommendations, and/or decision may be based on factors not within their control, such as incomplete or inaccurate data provided by me or distortions of diagnostic images or specimens that may result from electronic transmissions. I understand that the practice of medicine is not an exact science and that Practitioner makes no warranties or guarantees regarding treatment outcomes. I acknowledge that a copy of this consent can be made available to me via my patient portal [facility] MyChart), or I can request a printed copy by calling the office of [facility].    I understand that my insurance will be billed for this visit.   I have read or had this consent read to me. I understand the contents of this consent, which adequately explains the benefits and risks of the Services being provided via telemedicine.  I have been provided ample opportunity to ask questions regarding this consent and the Services and have had my questions answered to my satisfaction. I give my informed consent for the services to be provided through the use of telemedicine in my medical care

## 2024-06-27 NOTE — Progress Notes (Signed)
 Description   INR-2.8; Spoke with patient and advised to continue taking Warfarin 1/2 tablet (2.5mg ) daily. Stay consistent with greens (3 per week). Recheck INR in 1 week post procedure (ST normally 2 weeks)  Anticoagulation Clinic [phone]

## 2024-06-27 NOTE — Telephone Encounter (Signed)
 Appointment scheduled.

## 2024-06-30 ENCOUNTER — Ambulatory Visit: Admitting: "Endocrinology

## 2024-06-30 ENCOUNTER — Encounter: Payer: Self-pay | Admitting: Orthopedic Surgery

## 2024-07-01 ENCOUNTER — Other Ambulatory Visit (HOSPITAL_COMMUNITY): Payer: Self-pay

## 2024-07-01 ENCOUNTER — Other Ambulatory Visit: Payer: Self-pay

## 2024-07-01 ENCOUNTER — Encounter: Payer: Self-pay | Admitting: Orthopedic Surgery

## 2024-07-01 DIAGNOSIS — E1169 Type 2 diabetes mellitus with other specified complication: Secondary | ICD-10-CM | POA: Diagnosis not present

## 2024-07-01 DIAGNOSIS — I350 Nonrheumatic aortic (valve) stenosis: Secondary | ICD-10-CM | POA: Diagnosis not present

## 2024-07-01 DIAGNOSIS — I480 Paroxysmal atrial fibrillation: Secondary | ICD-10-CM | POA: Diagnosis not present

## 2024-07-01 DIAGNOSIS — I10 Essential (primary) hypertension: Secondary | ICD-10-CM | POA: Diagnosis not present

## 2024-07-01 DIAGNOSIS — I4821 Permanent atrial fibrillation: Secondary | ICD-10-CM | POA: Diagnosis not present

## 2024-07-03 DIAGNOSIS — M65351 Trigger finger, right little finger: Secondary | ICD-10-CM | POA: Diagnosis not present

## 2024-07-03 DIAGNOSIS — M65321 Trigger finger, right index finger: Secondary | ICD-10-CM | POA: Diagnosis not present

## 2024-07-03 DIAGNOSIS — M65311 Trigger thumb, right thumb: Secondary | ICD-10-CM | POA: Diagnosis not present

## 2024-07-05 DIAGNOSIS — I10 Essential (primary) hypertension: Secondary | ICD-10-CM | POA: Diagnosis not present

## 2024-07-05 DIAGNOSIS — E1169 Type 2 diabetes mellitus with other specified complication: Secondary | ICD-10-CM | POA: Diagnosis not present

## 2024-07-05 DIAGNOSIS — I350 Nonrheumatic aortic (valve) stenosis: Secondary | ICD-10-CM | POA: Diagnosis not present

## 2024-07-05 DIAGNOSIS — I4821 Permanent atrial fibrillation: Secondary | ICD-10-CM | POA: Diagnosis not present

## 2024-07-07 ENCOUNTER — Encounter: Payer: Self-pay | Admitting: Orthopedic Surgery

## 2024-07-08 ENCOUNTER — Other Ambulatory Visit: Payer: Self-pay | Admitting: Orthopedic Surgery

## 2024-07-08 MED ORDER — TRAMADOL HCL 50 MG PO TABS: 50.0000 mg | ORAL_TABLET | Freq: Four times a day (QID) | ORAL | 0 refills | Status: AC | PRN

## 2024-07-10 ENCOUNTER — Other Ambulatory Visit (HOSPITAL_COMMUNITY): Payer: Self-pay

## 2024-07-10 ENCOUNTER — Other Ambulatory Visit (HOSPITAL_BASED_OUTPATIENT_CLINIC_OR_DEPARTMENT_OTHER): Payer: Self-pay

## 2024-07-11 ENCOUNTER — Ambulatory Visit (INDEPENDENT_AMBULATORY_CARE_PROVIDER_SITE_OTHER): Admitting: *Deleted

## 2024-07-11 DIAGNOSIS — I480 Paroxysmal atrial fibrillation: Secondary | ICD-10-CM

## 2024-07-11 DIAGNOSIS — Z7901 Long term (current) use of anticoagulants: Secondary | ICD-10-CM

## 2024-07-11 LAB — POCT INR: INR: 2.3 (ref 2.0–3.0)

## 2024-07-11 NOTE — Progress Notes (Signed)
 Description   INR-2.3; Spoke with patient and advised to continue taking Warfarin 1/2 tablet (2.5mg ) daily. Stay consistent with greens (3 per week). Recheck INR in 2 weeks (ST normally 2 weeks).   Anticoagulation Clinic [phone]

## 2024-07-11 NOTE — Patient Instructions (Signed)
 Description   INR-2.3; Spoke with patient and advised to continue taking Warfarin 1/2 tablet (2.5mg ) daily. Stay consistent with greens (3 per week). Recheck INR in 2 weeks (ST normally 2 weeks).   Anticoagulation Clinic [phone]

## 2024-07-16 ENCOUNTER — Encounter: Admitting: Orthopedic Surgery

## 2024-07-16 ENCOUNTER — Encounter: Payer: Self-pay | Admitting: Orthopedic Surgery

## 2024-07-16 NOTE — Therapy (Incomplete)
 OUTPATIENT OCCUPATIONAL THERAPY ORTHO EVALUATION AND DISCHARGE NOTE  Patient Name: Debra Wright MRN: [id number] DOB:02-21-1951, 73 y.o., female Today's Date: 07/16/2024  REFERRING PROVIDER: Arlinda Buster, MD   END OF SESSION:   Past Medical History:  Diagnosis Date   Anxiety    Arthritis    knees; right thumb (10/14/2014)   Basal cell carcinoma    burned off upper lip & right shoulder   Chronic anticoagulation    CHADS VASC=3   Complication of anesthesia    after spinal anesthesia for left knee replacement, bladder did not wake up. Was incontinent for 4 days post surgery   Depression    Fibroid tumor    Frequent diarrhea    GERD (gastroesophageal reflux disease)    High cholesterol    Hypertension    EF 65-70% grade 2DD   Insomnia    Migraine    frequently when I was younger; maybe monthly now (10/14/2014) - none after hysterectomy   PAF (paroxysmal atrial fibrillation) (HCC) 05/02/2018   converted with rate control   Pernicious anemia    pt not aware of this   Sleep apnea    uses a cpap   Type II diabetes mellitus (HCC)    Vitamin B 12 deficiency    Past Surgical History:  Procedure Laterality Date   ABDOMINAL HYSTERECTOMY  1988   ACHILLES TENDON SURGERY Right    APPENDECTOMY  1988   BILATERAL OOPHORECTOMY  2004   CARPAL TUNNEL RELEASE Bilateral 1986   CARPAL TUNNEL RELEASE Right 11/09/2023   Procedure: RIGHT CARPAL TUNNEL RELEASE;  Surgeon: Arlinda Buster, MD;  Location: [facility];  Service: Orthopedics;  Laterality: Right;   CATARACT EXTRACTION W/ INTRAOCULAR LENS  IMPLANT, BILATERAL Bilateral 2011   COLONOSCOPY     HYPOTHENAR FAT PAD TRANSFER Right 11/09/2023   Procedure: RIGHT HYPOTHENAR FAT PAD TRANSFER;  Surgeon: Arlinda Buster, MD;  Location: [facility];  Service: Orthopedics;  Laterality: Right;   JOINT REPLACEMENT     SHOULDER ARTHROSCOPY DISTAL CLAVICLE EXCISION AND OPEN ROTATOR CUFF REPAIR Right 11/2013    TOENAIL EXCISION Right 04/2018   big toe   TONSILLECTOMY  1950's   TOTAL KNEE ARTHROPLASTY Left 10/13/2014   Procedure: LEFT TOTAL KNEE ARTHROPLASTY;  Surgeon: Lonni CINDERELLA Poli, MD;  Location: [facility] OR;  Service: Orthopedics;  Laterality: Left;   TOTAL KNEE ARTHROPLASTY Right 12/10/2018   Procedure: RIGHT TOTAL KNEE ARTHROPLASTY;  Surgeon: Poli Lonni CINDERELLA, MD;  Location: [facility] OR;  Service: Orthopedics;  Laterality: Right;   Patient Active Problem List   Diagnosis Date Noted   Aortic valve disorder 06/26/2024   Anxiety 06/26/2024   Decreased estrogen level 06/26/2024   Hyperglycemia due to type 2 diabetes mellitus (HCC) 06/26/2024   Hardening of the aorta (main artery of the heart) 06/26/2024   Moderate recurrent major depression (HCC) 06/26/2024   Overactive bladder 06/26/2024   Pure hypertriglyceridemia 06/26/2024   Carpal tunnel syndrome of right wrist 11/09/2023   Chronic pain syndrome 08/03/2023   Unilateral primary osteoarthritis, right knee 12/10/2018   Status post total right knee replacement 12/10/2018   Long term (current) use of anticoagulants 08/16/2018   Hypertensive cardiovascular disease 06/04/2018   Morbid obesity with BMI of 40.0-44.9, adult (HCC) 06/04/2018   Fatigue 06/04/2018   Chronic anticoagulation    PAF (paroxysmal atrial fibrillation) (HCC) 05/02/2018   Primary osteoarthritis of right knee 06/14/2017   OSA (obstructive sleep apnea) 02/13/2017   Cervical radiculopathy 01/02/2017   NIDDM-type  2 10/31/2016   Essential hypertension 08/02/2016   Anxiety and depression 08/02/2016   Insomnia 08/02/2016   Arthritis of left knee 10/13/2014   Status post total left knee replacement 10/13/2014     ONSET DATE:  DOS 07/03/24  REFERRING DIAG:  M65.311 (ICD-10-CM) - Trigger thumb, right thumb  M65.321 (ICD-10-CM) - Trigger index finger of right hand  M65.351 (ICD-10-CM) - Trigger little finger of right hand    THERAPY DIAG:     No diagnosis  found.  Rationale for Evaluation and Treatment: Rehabilitation  SUBJECTIVE:   SUBJECTIVE STATEMENT: The patient states hx of triggering and pain in their Rt hand in multiple digits and subsequent surgical release. The patient states having some stiffness, pain, decreased ability to make a fist and perform I/ADLs.     PERTINENT HISTORY: The patient is now approx 2 weeks s/p Rt hand thumb, SF, IF TFR.   PRECAUTIONS: None relative to this evaluation and episode of care.   RED FLAGS: None   WEIGHT BEARING RESTRICTIONS: Yes: caution with weightbearing for the next 4-6 weeks, recommended less than 5lbs for next 2 weeks with affected hand  PAIN:  Are you having pain? Yes: NPRS scale: between mild and moderate now at rest  Pain location:  sx area Pain description: aching and sore Aggravating factors: gripping/squeezing Relieving factors: rest  FALLS: Has patient fallen in last 6 months? No, not a fall risk  PLOF: Independent with I/ADLs  PATIENT GOALS: To improve motion, function with affected surgical hand  NEXT MD VISIT: PRN    OBJECTIVE MEASURES:   ADLs: Overall ADLs: States decreased ability to grab, hold household objects, pain and difficulty to open containers, perform FMS tasks (manipulate fasteners on clothing).     UPPER EXTREMITY ROM:     A/ROM Right eval Left eval  Wrist flexion *** ***  Wrist extension *** ***  (Blank rows = not tested)                    Hand A/ROM Right eval Left eval  Full Fist Ability (or Gap to Distal Palmar Crease) Unable due to stiffness/soreness Unable due to stiffness/soreness  Thumb Opposition  (Kapandji Scale)  ***/10 ***/10  Thumb MCP (0-60)    Thumb IP (0-80)    Index MCP (0-90)     Index PIP (0-100)     Index DIP (0-70)      Long MCP (0-90)      Long PIP (0-100)      Long DIP (0-70)      Ring MCP (0-90)      Ring PIP (0-100)      Ring DIP (0-70)      Little MCP (0-90)      Little PIP (0-100)      Little DIP  (0-70)      (Blank rows = not tested)   HAND STRENGTH & FUNCTION: Eval: Observed weakness in affected hand/arm, grossly 3-/5 MMT, but specific gripping and resistance training contraindicated today. Also at least mild observed coordination impairments with affected hand/arm due to stiffness and soreness. These deficits are expected to improve with HEP and recommendations.    COORDINATION: Eval: Mild observed coordination impairments with surgical hand, as seen by pain,stiffness, etc. Expected to improve with HEP and recommendations.   SENSATION: Eval:  Light touch mildly diminished especially through sx area. Expected to improve with HEP and recommendations.   EDEMA:   Eval:  Mildly swollen in surgical hand today.  Expected  to improve with HEP and recommendations.   COGNITION: Eval: Overall cognitive status: WFL for evaluation today   OBSERVATIONS:   Eval: Surgical site is clean and no overt signs of infection, no drainage, signs of dehiscence, etc.  Tenderness and swelling is within normal limits for post-op timeframe.  ***   TODAY'S TREATMENT:  Post-evaluation treatment:   The patient was given safety information for managing post-op wound, including not to soak wound, to keep clean and dry, to start with gentle scar mobilizations approx 3 days after stitches are removed and if the wound is closed. The patient should replace their wound dressing at least 1x daily, and monitor for signs of infection.  The patient was supplied with compressive gauze to help with swelling as needed.  The patient should contact the surgeon with any concerns immediately.   The patient should also avoid any strong gripping, push, pull, weight bearing or repetitive motion for the next month.  The patient  should not be doing painful activities.   After a month, the patient can progressively return to all light, normal activities. Sports and heavy weight lifting should be withheld for a total of 3 months.    The patient was also educated (explanation and demonstration) on the following home exercise program including tolerable range of motion, gentle passive range of motion, scar care, progressive desensitization, prevention of soft tissue contractures, etc. The patient states understanding all directions and feels comfortable with doing this at home, self-management, and following up with the surgeon as needed/scheduled.    Less that 8 minutes were spent on education and treatment today, and will not be billed to the patient.      Trigger Release THUMB Exercises     *** - Bend and Pull Back Wrist SLOWLY  - 4 x daily - 10-15 reps - Tendon Glides  - 4-5 x daily - 5 reps - 3 second hold - Thumb Opposition  - 4-5 x daily - 10 reps - Thumb AROM IP Blocking  - 4-5 x daily - 10 reps - Thumb stretch  - 3-4 x daily - 3 reps - 15 hold - Wrist Prayer Stretch  - 3-4 x daily - 3 reps - 15 seconds hold Patient Education - Scar Massage  Trigger Finger Release Exercises   *** Exercises - Bend and Pull Back Wrist SLOWLY  - 4-6 x daily - 10-15 reps - Tendon Glides  - 3-4 x daily - 5 reps - 3 second hold - Wrist Prayer Stretch  - 3-4 x daily - 3 reps - 15 seconds hold - Full finger stretches   - 3-4 x daily - 3 reps - 15 second hold - Push your knuckles down, pull back hand to feel a stretch  - 3-4 x daily - 3 reps - 15 second hold  Patient Education - Scar Massage     PATIENT EDUCATION: Education details: See tx section above for details  Person educated: Patient Education method: Verbal Instruction, Teach back, Handouts  Education comprehension: States and demonstrates understanding   HOME EXERCISE PROGRAM: See tx section above for details    GOALS: Goals reviewed with patient? Yes   SHORT TERM GOALS: (STG required if POC>30 days) Target Date: ***  1.  Pt will demo/state understanding of initial HEP and therapist recommendations to improve pain levels, improve motions and ability  and eventually return to normal activities.   Goal status: MET    ASSESSMENT:  CLINICAL IMPRESSION: Patient is a *** y.o. ***  who was seen today for occupational therapy evaluation for swelling, pain, weakness and decreased functional ability following trigger finger release procedure. The patient is appropriate for OT rehab services and benefited from treatment today. The patient got copious education/treatment today for self-care, wound management, exercises and how to transition to normal activities in the next 4-6 weeks. The patient agrees that they can manage these recommendations independently, and should not need to return for follow up visits. The patient should follow up with the surgeon with any concerns, and could possibly return to therapy, if needed, with a new order.  The patient will discharge therapy treatment after this visit.     PERFORMANCE DEFICITS: in functional skills including ADLs, IADLs, coordination, dexterity, sensation, edema, ROM, strength, pain, fascial restrictions, flexibility, Fine motor control, body mechanics, endurance, decreased knowledge of precautions, wound, and UE functional use, cognitive skills including problem solving and safety awareness, and psychosocial skills including coping strategies, environmental adaptation, and habits.   IMPAIRMENTS: are limiting patient from ADLs, IADLs, rest and sleep, leisure, and social participation.   COMORBIDITIES: may have co-morbidities  that affects occupational performance. Patient will benefit from skilled OT to address above impairments and improve overall function.  MODIFICATION OR ASSISTANCE TO COMPLETE EVALUATION: No modification of tasks or assist necessary to complete an evaluation.  OT OCCUPATIONAL PROFILE AND HISTORY: Problem focused assessment: Including review of records relating to presenting problem.  CLINICAL DECISION MAKING: LOW - limited treatment options, no task modification necessary  REHAB  POTENTIAL: Excellent  EVALUATION COMPLEXITY: Low      PLAN:  OT FREQUENCY: one time visit  OT DURATION: 1 sessions  PLANNED INTERVENTIONS: self care/ADL training, therapeutic exercise, therapeutic activity, neuromuscular re-education, manual therapy, scar mobilization, passive range of motion, splinting, ultrasound, fluidotherapy, compression bandaging, moist heat, cryotherapy, contrast bath, patient/family education, energy conservation, coping strategies training, and Re-evaluation  RECOMMENDED OTHER SERVICES: none now   CONSULTED AND AGREED WITH PLAN OF CARE: Patient  PLAN FOR NEXT SESSION:   N/A    Melvenia Ada, OTR/L, CHT 07/16/2024, 4:54 PM

## 2024-07-17 ENCOUNTER — Encounter: Payer: Self-pay | Admitting: Orthopedic Surgery

## 2024-07-17 ENCOUNTER — Encounter: Admitting: Rehabilitative and Restorative Service Providers"

## 2024-07-21 ENCOUNTER — Ambulatory Visit: Admitting: Orthopedic Surgery

## 2024-07-21 DIAGNOSIS — Z9889 Other specified postprocedural states: Secondary | ICD-10-CM

## 2024-07-21 NOTE — Progress Notes (Signed)
   Debra Wright - 73 y.o. female MRN [id number]  Date of birth: Feb 17, 1951  Office Visit Note: Visit Date: 07/21/2024 PCP: Chrystal Lamarr RAMAN, MD Referred by: Chrystal Lamarr RAMAN, *  Subjective:  HPI: Debra Wright is a 73 y.o. female who presents today for follow up 2 weeks status post right hand thumb, index, small finger trigger digit release.  She is doing well overall, has resumed activities as tolerated without significant restriction.  Pain is well-controlled  Pertinent ROS were reviewed with the patient and found to be negative unless otherwise specified above in HPI.   Assessment & Plan: Visit Diagnoses:  1. S/P trigger finger release     Plan: She is doing very well postoperatively.  We did discuss possibility of occupational therapy for range of motion exercises, however given that she is making such good progress on her own, she would like to continue with home exercises for the time being which is appropriate.  I will plan on having her return in approximately-1 month or as needed moving forward, given that she has some travel restrictions  Follow-up: No follow-ups on file.   Meds & Orders: No orders of the defined types were placed in this encounter.  No orders of the defined types were placed in this encounter.    Procedures: No procedures performed       Objective:   Vital Signs: There were no vitals taken for this visit.  Ortho Exam Right hand: - Well-healing incision at the base of the thumb, index and small finger, skin is well-approximated, no erythema or drainage, sutures removed today - Able to perform full digital range of motion without residual clicking or locking - Sensation intact distally, hand is warm well-perfused   Imaging: No results found.   Keena Dinse Afton Alderton, M.D. [facility], Hand Surgery

## 2024-07-24 ENCOUNTER — Ambulatory Visit (INDEPENDENT_AMBULATORY_CARE_PROVIDER_SITE_OTHER): Payer: Self-pay | Admitting: Cardiovascular Disease

## 2024-07-24 DIAGNOSIS — I480 Paroxysmal atrial fibrillation: Secondary | ICD-10-CM

## 2024-07-24 DIAGNOSIS — Z5181 Encounter for therapeutic drug level monitoring: Secondary | ICD-10-CM

## 2024-07-24 LAB — POCT INR: INR: 3.8 — AB (ref 2.0–3.0)

## 2024-07-24 NOTE — Progress Notes (Signed)
 Lab Results  Component Value Date   INR 3.8 (A) 07/24/2024   INR 2.3 07/11/2024   INR 2.8 06/26/2024    Description   INR-3.8; Spoke with patient and advised her to hold warfarin today and then continue taking Warfarin 1/2 tablet (2.5mg ) daily. Stay consistent with greens (3 per week). Recheck INR in 2 weeks (ST normally 2 weeks).   [facility] [phone]

## 2024-07-24 NOTE — Patient Instructions (Signed)
 Description   INR-3.8; Spoke with patient and advised her to hold warfarin today and then continue taking Warfarin 1/2 tablet (2.5mg ) daily. Stay consistent with greens (3 per week). Recheck INR in 2 weeks (ST normally 2 weeks).   Anticoagulation Clinic [phone]

## 2024-07-27 ENCOUNTER — Encounter: Payer: Self-pay | Admitting: Orthopedic Surgery

## 2024-07-31 ENCOUNTER — Encounter: Payer: Self-pay | Admitting: "Endocrinology

## 2024-07-31 ENCOUNTER — Ambulatory Visit (INDEPENDENT_AMBULATORY_CARE_PROVIDER_SITE_OTHER): Admitting: "Endocrinology

## 2024-07-31 VITALS — BP 122/80 | HR 85 | Ht 61.0 in | Wt 216.0 lb

## 2024-07-31 DIAGNOSIS — E782 Mixed hyperlipidemia: Secondary | ICD-10-CM | POA: Diagnosis not present

## 2024-07-31 DIAGNOSIS — E1165 Type 2 diabetes mellitus with hyperglycemia: Secondary | ICD-10-CM

## 2024-07-31 DIAGNOSIS — Z7984 Long term (current) use of oral hypoglycemic drugs: Secondary | ICD-10-CM

## 2024-07-31 NOTE — Progress Notes (Signed)
 Outpatient Endocrinology Note Debra Birmingham, MD    Debra Wright 09-07-1951 [id number]  Referring Provider: Chrystal Lamarr RAMAN, * Primary Care Provider: Chrystal Lamarr RAMAN, MD Reason for consultation: Subjective  Type 2 diabetes mellitus  Assessment & Plan  Diagnoses and all orders for this visit:  Uncontrolled type 2 diabetes mellitus with hyperglycemia, without long-term current use of insulin  (HCC)  Long term (current) use of oral hypoglycemic drugs  Mixed hypercholesterolemia and hypertriglyceridemia    Diabetes complicated by neuropathy Hba1c goal less than 7.5, current Hba1c is  Lab Results  Component Value Date   HGBA1C 7.0 (H) 06/23/2024   HGBA1C 8.2 (H) 03/07/2024   HGBA1C 6.9 (A) 10/29/2023   Will recommend for the following change of medications to: Metformin  1000 mg twice daily Glipizide  XL 5 mg every morning  Ordered DexCom and libre, pt has yet to check the cost  Counseled on lifestyle changes  Cannot afford Jardiance  25 mg every day Ozempic  0.25 mg/week not covered  Stop Glipizide  XL 10 mg every morning (hold if blood sugar is less than 100)  No history of MEN syndrome/medullary thyroid  cancer/pancreatitis or pancreatic cancer in self or family  Start gabapentin  100 mg qd upto 3 pills PRN neuropathic burning pain  Check BG alternating times of the day and bring log  No known contraindications to any of above medications  Hyperlipidemia -Last LDL near goal: 82 -on rosuvastatin  20 mg QD -Follow low fat diet and exercise   -Blood pressure goal <140/90 - Microalbumin/creatinine at goal < 30 -not on ACE/ARB -diet changes including salt restriction -limit eating outside -counseled BP targets per standards of diabetes care -Uncontrolled blood pressure can lead to retinopathy, nephropathy and cardiovascular and atherosclerotic heart disease  -Na low at 131, new finding  Speak to PCP about hyponatremia, pt is every other day  spirolactone 25 mg  Pt understands and agreed  Reviewed and counseled on: -A1C target -Blood sugar targets -Complications of uncontrolled diabetes  -Checking blood sugar before meals and bedtime and bring log next visit -All medications with mechanism of action and side effects -Hypoglycemia management: rule of 15's, Glucagon Emergency Kit and medical alert ID -low-carb low-fat plate-method diet -At least 20 minutes of physical activity per day -Annual dilated retinal eye exam and foot exam -compliance and follow up needs -follow up as scheduled or earlier if problem gets worse  Call if blood sugar is less than 70 or consistently above 250    Take a 15 gm snack of carbohydrate at bedtime before you go to sleep if your blood sugar is less than 100.    If you are going to fast after midnight for a test or procedure, ask your physician for instructions on how to reduce/decrease your insulin  dose.    Call if blood sugar is less than 70 or consistently above 250  -Treating a low sugar by rule of 15  (15 gms of sugar every 15 min until sugar is more than 70) If you feel your sugar is low, test your sugar to be sure If your sugar is low (less than 70), then take 15 grams of a fast acting Carbohydrate (3-4 glucose tablets or glucose gel or 4 ounces of juice or regular soda) Recheck your sugar 15 min after treating low to make sure it is more than 70 If sugar is still less than 70, treat again with 15 grams of carbohydrate          Don't drive the  hour of hypoglycemia  If unconscious/unable to eat or drink by mouth, use glucagon injection or nasal spray baqsimi and call 911. Can repeat again in 15 min if still unconscious.  No follow-ups on file.   I spent more than 50% of today's visit counseling patient on symptoms, examination findings, lab findings, imaging results, treatment decisions and monitoring and prognosis. The patient understood the recommendations and agrees with the  treatment plan. All questions regarding treatment plan were fully answered.  Debra Birmingham, MD  07/31/24    History of Present Illness Debra Wright is a 73 y.o. year old female who presents for follow up on of Type 2 diabetes mellitus.  Debra Wright was first diagnosed in 2014.   Diabetes education +  Home diabetes regimen: Metformin  1000 mg twice daily Glipizide  XL 10 mg every morning   Cannot Jardiance  25 mg every day Ozempic  0.25 mg/week not covered  Non-insulin  hypoglycemic drugs previously used: Metformin , glipizide , repaglinide  1 mg since 3/22 (stopped in 05/2022), Januvia   COMPLICATIONS -  MI/Stroke -  retinopathy, last eye exam 2024 +  neuropathy -  nephropathy  BLOOD SUGAR DATA Per recall 74-162, checks 2x/day, checks different times  Did not bring meter   Physical Exam  BP 122/80   Pulse 85   Ht 5' 1 (1.549 m)   Wt 216 lb (98 kg)   SpO2 96%   BMI 40.81 kg/m    Constitutional: well developed, well nourished Head: normocephalic, atraumatic Eyes: sclera anicteric, no redness Neck: supple Lungs: normal respiratory effort Neurology: alert and oriented Skin: dry, bruises all over body (on warfarin for A.fib) Musculoskeletal: no appreciable defects Psychiatric: normal mood and affect Diabetic Foot Exam - Simple   No data filed      Current Medications Patient's Medications  New Prescriptions   No medications on file  Previous Medications   BLOOD GLUCOSE MONITORING SUPPL (ACCU-CHEK GUIDE ME) W/DEVICE KIT    USE AS DIRECTED.  Dx E11.9.   CALCIUM  CARBONATE (TUMS - DOSED IN MG ELEMENTAL CALCIUM ) 500 MG CHEWABLE TABLET    Chew 2 tablets by mouth daily as needed for indigestion or heartburn.   CALCIUM  CARBONATE-VITAMIN D (CALCIUM  600 + D PO)    Take 1 tablet by mouth daily.   CETIRIZINE (ZYRTEC) 10 MG TABLET    Take 10 mg by mouth daily as needed for allergies.   CINNAMON PO    Take 2 tablets by mouth daily.   CLONAZEPAM  (KLONOPIN ) 0.5 MG TABLET     TAKE 1/2 TO 1 (ONE-HALF TO ONE) TABLET BY MOUTH AT BEDTIME AS NEEDED   CYANOCOBALAMIN  (VITAMIN B12) 1000 MCG TBCR    Take 1,000 mcg by mouth daily.   DILTIAZEM  (CARDIZEM  CD) 180 MG 24 HR CAPSULE    TAKE ONE CAPSULE BY MOUTH ONCE A DAY   DILTIAZEM  (TIAZAC ) 180 MG 24 HR CAPSULE    Take 1 capsule (180 mg total) by mouth daily.   ESCITALOPRAM (LEXAPRO) 10 MG TABLET    1/2 tablet once a day x 1 week, then increase to 1 tablet daily   ESZOPICLONE  3 MG TABS    Take 1 tablet (3 mg total) by mouth at bedtime as needed. Take immediately before bedtime   GABAPENTIN  (NEURONTIN ) 100 MG CAPSULE    Take 1 capsule (100 mg total) by mouth daily.   GLIPIZIDE  (GLUCOTROL  XL) 5 MG 24 HR TABLET    Take 1 tablet (5 mg total) by mouth daily with breakfast. Hold if  BG <80   GLUCOSE BLOOD (ACCU-CHEK GUIDE) TEST STRIP    CHECK BLOOD GLUCOSE IN THE MORNING AND AT BEDTIME.  E11.9 code.   MAGNESIUM  OXIDE (MAG-OX) 400 (241.3 MG) MG TABLET    Take 400 mg by mouth daily.   METFORMIN  (GLUCOPHAGE ) 1000 MG TABLET    TAKE 1 TABLET (1,000 MG TOTAL) BY MOUTH TWICE A DAY WITH FOOD   METOPROLOL  TARTRATE (LOPRESSOR ) 25 MG TABLET    Take 1 tablet (25 mg total) by mouth 2 (two) times daily.   OMEGA-3 FATTY ACIDS (FISH OIL) 1200 MG CAPS    Take 1,200 mg by mouth 2 (two) times daily.   OMEPRAZOLE  (PRILOSEC) 40 MG CAPSULE    Take 1 capsule by mouth once daily   OXYBUTYNIN (DITROPAN) 5 MG TABLET    Take 5 mg by mouth 2 (two) times daily.   ROSUVASTATIN  (CRESTOR ) 20 MG TABLET    Take 1 tablet (20 mg total) by mouth daily.   SPIRONOLACTONE  (ALDACTONE ) 25 MG TABLET    TAKE HALF TABLET BY MOUTH DAILY   TOLTERODINE  (DETROL  LA) 4 MG 24 HR CAPSULE    Take 4 mg by mouth daily.   TRAMADOL  (ULTRAM ) 50 MG TABLET    Take 1 tablet (50 mg total) by mouth every 6 (six) hours as needed.   VITAMIN C (ASCORBIC ACID) 250 MG TABLET    Take 250 mg by mouth daily.   WARFARIN (COUMADIN ) 5 MG TABLET    TAKE ONE-HALF TO ONE TABLET BY MOUTH DAILY AS DIRECTED BY  COUMADIN  CLINIC  Modified Medications   No medications on file  Discontinued Medications   No medications on file    Allergies Allergies  Allergen Reactions   Antivert  [Meclizine  Hcl] Other (See Comments)    makes me feel like my skin is falling off.  03/05/20 pt states she was placed on this a few months back and did not have a reaction   Meclizine  Other (See Comments)    makes me feel like my skin is falling off.  03/05/20 pt states she was placed on this a few months back and did not have a reaction Other reaction(s): jumping out of skin, jittery   Trazodone Hcl Other (See Comments)    Other reaction(s): nightmares   Zolpidem  Other (See Comments)    Reverse affect   Zolpidem  Tartrate     Other reaction(s): doing things in sleep unaware    Past Medical History Past Medical History:  Diagnosis Date   Anxiety    Arthritis    knees; right thumb (10/14/2014)   Basal cell carcinoma    burned off upper lip & right shoulder   Chronic anticoagulation    CHADS VASC=3   Complication of anesthesia    after spinal anesthesia for left knee replacement, bladder did not wake up. Was incontinent for 4 days post surgery   Depression    Fibroid tumor    Frequent diarrhea    GERD (gastroesophageal reflux disease)    High cholesterol    Hypertension    EF 65-70% grade 2DD   Insomnia    Migraine    frequently when I was younger; maybe monthly now (10/14/2014) - none after hysterectomy   PAF (paroxysmal atrial fibrillation) (HCC) 05/02/2018   converted with rate control   Pernicious anemia    pt not aware of this   Sleep apnea    uses a cpap   Type II diabetes mellitus (HCC)    Vitamin B  12 deficiency     Past Surgical History Past Surgical History:  Procedure Laterality Date   ABDOMINAL HYSTERECTOMY  1988   ACHILLES TENDON SURGERY Right    APPENDECTOMY  1988   BILATERAL OOPHORECTOMY  2004   CARPAL TUNNEL RELEASE Bilateral 1986   CARPAL TUNNEL RELEASE Right  11/09/2023   Procedure: RIGHT CARPAL TUNNEL RELEASE;  Surgeon: Arlinda Buster, MD;  Location: [facility];  Service: Orthopedics;  Laterality: Right;   CATARACT EXTRACTION W/ INTRAOCULAR LENS  IMPLANT, BILATERAL Bilateral 2011   COLONOSCOPY     HYPOTHENAR FAT PAD TRANSFER Right 11/09/2023   Procedure: RIGHT HYPOTHENAR FAT PAD TRANSFER;  Surgeon: Arlinda Buster, MD;  Location: [facility];  Service: Orthopedics;  Laterality: Right;   JOINT REPLACEMENT     SHOULDER ARTHROSCOPY DISTAL CLAVICLE EXCISION AND OPEN ROTATOR CUFF REPAIR Right 11/2013   TOENAIL EXCISION Right 04/2018   big toe   TONSILLECTOMY  1950's   TOTAL KNEE ARTHROPLASTY Left 10/13/2014   Procedure: LEFT TOTAL KNEE ARTHROPLASTY;  Surgeon: Lonni CINDERELLA Poli, MD;  Location: [facility] OR;  Service: Orthopedics;  Laterality: Left;   TOTAL KNEE ARTHROPLASTY Right 12/10/2018   Procedure: RIGHT TOTAL KNEE ARTHROPLASTY;  Surgeon: Poli Lonni CINDERELLA, MD;  Location: [facility] OR;  Service: Orthopedics;  Laterality: Right;    Family History family history includes Arthritis in her father, maternal grandfather, maternal grandmother, mother, paternal grandfather, and paternal grandmother; Diabetes in her brother, father, maternal grandmother, mother, paternal grandfather, and paternal grandmother; Heart disease in her father; Hyperlipidemia in her father, maternal grandfather, maternal grandmother, mother, paternal grandfather, and paternal grandmother; Hypertension in her brother, maternal grandfather, maternal grandmother, mother, paternal grandfather, and paternal grandmother; Stroke in her brother.  Social History Social History   Socioeconomic History   Marital status: Married    Spouse name: Not on file   Number of children: 1   Years of education: 14   Highest education level: Not on file  Occupational History   Not on file  Tobacco Use   Smoking status: Never   Smokeless tobacco: Never  Vaping Use    Vaping status: Never Used  Substance and Sexual Activity   Alcohol use: Yes    Comment: 10/14/2014 might have a drink when I'm out once/month   Drug use: No   Sexual activity: Not Currently    Birth control/protection: Surgical    Comment: hyst  Other Topics Concern   Not on file  Social History Narrative   She is a retired [employer] - was an [employer] for 41   Married       Right handed    Drinks caffeine one cup of coffee daily   An apartment    Social Drivers of Corporate Investment Banker Strain: Not on file  Food Insecurity: Not on file  Transportation Needs: Not on file  Physical Activity: Not on file  Stress: Not on file  Social Connections: Not on file  Intimate Partner Violence: Not on file    Lab Results  Component Value Date   HGBA1C 7.0 (H) 06/23/2024   Lab Results  Component Value Date   CHOL 77 06/23/2024   Lab Results  Component Value Date   HDL 28 (L) 06/23/2024   Lab Results  Component Value Date   LDLCALC 24 06/23/2024   Lab Results  Component Value Date   TRIG 178 (H) 06/23/2024   Lab Results  Component Value Date   CHOLHDL 2.8 06/23/2024  Lab Results  Component Value Date   CREATININE 0.71 06/23/2024   Lab Results  Component Value Date   GFR 82.37 05/07/2023   Lab Results  Component Value Date   MICROALBUR 21.0 03/07/2024      Component Value Date/Time   NA 139 06/23/2024 1017   NA 141 01/15/2020 1417   K 4.1 06/23/2024 1017   CL 102 06/23/2024 1017   CO2 27 06/23/2024 1017   GLUCOSE 81 06/23/2024 1017   BUN 9 06/23/2024 1017   BUN 8 01/15/2020 1417   CREATININE 0.71 06/23/2024 1017   CALCIUM  8.8 06/23/2024 1017   PROT 5.7 (L) 06/23/2024 1017   ALBUMIN 3.6 05/07/2023 1432   AST 15 06/23/2024 1017   ALT 6 06/23/2024 1017   ALKPHOS 62 05/07/2023 1432   BILITOT 0.5 06/23/2024 1017   GFRNONAA >60 11/08/2023 1330   GFRAA 110 01/15/2020 1417      Latest Ref Rng & Units 06/23/2024   10:17 AM 11/08/2023    1:30 PM 05/07/2023    2:32  PM  BMP  Glucose 65 - 99 mg/dL 81  719  847   BUN 7 - 25 mg/dL 9  18  15    Creatinine 0.60 - 1.00 mg/dL 9.28  9.18  9.27   BUN/Creat Ratio 6 - 22 (calc) SEE NOTE:     Sodium 135 - 146 mmol/L 139  133  137   Potassium 3.5 - 5.3 mmol/L 4.1  4.9  4.1   Chloride 98 - 110 mmol/L 102  100  104   CO2 20 - 32 mmol/L 27  22  23    Calcium  8.6 - 10.4 mg/dL 8.8  8.9  8.7        Component Value Date/Time   WBC 8.8 07/12/2022 1803   RBC 4.92 07/12/2022 1803   HGB 14.2 07/12/2022 1803   HCT 44.6 07/12/2022 1803   PLT 264 07/12/2022 1803   MCV 90.7 07/12/2022 1803   MCH 28.9 07/12/2022 1803   MCHC 31.8 07/12/2022 1803   RDW 14.4 07/12/2022 1803   LYMPHSABS 2.7 07/12/2022 1803   MONOABS 0.7 07/12/2022 1803   EOSABS 0.1 07/12/2022 1803   BASOSABS 0.0 07/12/2022 1803     Parts of this note may have been dictated using voice recognition software. There may be variances in spelling and vocabulary which are unintentional. Not all errors are proofread. Please notify the dino if any discrepancies are noted or if the meaning of any statement is not clear.

## 2024-08-01 DIAGNOSIS — I480 Paroxysmal atrial fibrillation: Secondary | ICD-10-CM | POA: Diagnosis not present

## 2024-08-01 DIAGNOSIS — E1169 Type 2 diabetes mellitus with other specified complication: Secondary | ICD-10-CM | POA: Diagnosis not present

## 2024-08-01 DIAGNOSIS — I350 Nonrheumatic aortic (valve) stenosis: Secondary | ICD-10-CM | POA: Diagnosis not present

## 2024-08-04 ENCOUNTER — Encounter: Payer: Self-pay | Admitting: Radiology

## 2024-08-07 ENCOUNTER — Ambulatory Visit (INDEPENDENT_AMBULATORY_CARE_PROVIDER_SITE_OTHER): Admitting: Cardiovascular Disease

## 2024-08-07 DIAGNOSIS — Z5181 Encounter for therapeutic drug level monitoring: Secondary | ICD-10-CM

## 2024-08-07 DIAGNOSIS — I480 Paroxysmal atrial fibrillation: Secondary | ICD-10-CM

## 2024-08-07 LAB — POCT INR: INR: 2.3 (ref 2.0–3.0)

## 2024-08-07 NOTE — Progress Notes (Signed)
 Lab Results  Component Value Date   INR 2.3 08/07/2024   INR 3.8 (A) 07/24/2024   INR 2.3 07/11/2024   Description   INR-2.3; Spoke with patient and advised her to  continue taking Warfarin 1/2 tablet (2.5mg ) daily. Stay consistent with greens (3 per week). Recheck INR in 2 weeks (ST normally 2 weeks).   Anticoagulation Clinic [phone]

## 2024-08-07 NOTE — Patient Instructions (Signed)
 Description   INR-2.3; Spoke with patient and advised her to  continue taking Warfarin 1/2 tablet (2.5mg ) daily. Stay consistent with greens (3 per week). Recheck INR in 2 weeks (ST normally 2 weeks).   Anticoagulation Clinic [phone]

## 2024-08-14 ENCOUNTER — Encounter: Payer: Self-pay | Admitting: Orthopedic Surgery

## 2024-08-17 ENCOUNTER — Other Ambulatory Visit: Payer: Self-pay | Admitting: "Endocrinology

## 2024-08-17 DIAGNOSIS — E1165 Type 2 diabetes mellitus with hyperglycemia: Secondary | ICD-10-CM

## 2024-08-17 NOTE — Progress Notes (Unsigned)
   Debra Wright - 73 y.o. female MRN [id number]  Date of birth: 03/28/1951  Office Visit Note: Visit Date: 08/18/2024 PCP: Chrystal Lamarr RAMAN, MD Referred by: Chrystal Lamarr RAMAN, *  Subjective:  HPI: Debra Wright is a 73 y.o. female who presents today for follow up approximately weeks status post right hand thumb, index, small finger trigger digit release.  ***  Pertinent ROS were reviewed with the patient and found to be negative unless otherwise specified above in HPI.   Assessment & Plan: Visit Diagnoses:  No diagnosis found.   Plan: ***  Follow-up: No follow-ups on file.   Meds & Orders: No orders of the defined types were placed in this encounter.  No orders of the defined types were placed in this encounter.    Procedures: No procedures performed       Objective:   Vital Signs: There were no vitals taken for this visit.  Ortho Exam Right hand: - Well-healing incision at the base of the thumb, index and small finger, skin is well-approximated, no erythema or drainage, sutures removed today - Able to perform full digital range of motion without residual clicking or locking - Sensation intact distally, hand is warm well-perfused   Imaging: No results found.   Sharelle Burditt Afton Alderton, M.D. [facility], Hand Surgery

## 2024-08-18 ENCOUNTER — Ambulatory Visit: Admitting: Orthopedic Surgery

## 2024-08-18 DIAGNOSIS — Z9889 Other specified postprocedural states: Secondary | ICD-10-CM

## 2024-08-21 ENCOUNTER — Ambulatory Visit (INDEPENDENT_AMBULATORY_CARE_PROVIDER_SITE_OTHER): Admitting: Cardiovascular Disease

## 2024-08-21 DIAGNOSIS — I480 Paroxysmal atrial fibrillation: Secondary | ICD-10-CM

## 2024-08-21 DIAGNOSIS — Z5181 Encounter for therapeutic drug level monitoring: Secondary | ICD-10-CM

## 2024-08-21 LAB — POCT INR: INR: 2.6 (ref 2.0–3.0)

## 2024-08-21 NOTE — Progress Notes (Signed)
 Lab Results  Component Value Date   INR 2.6 08/21/2024   INR 2.3 08/07/2024   INR 3.8 (A) 07/24/2024    Description   INR-2.6; Spoke with patient and advised her to  continue taking Warfarin 1/2 tablet (2.5mg ) daily. Stay consistent with greens (3 per week). Recheck INR in 2 weeks (ST normally 2 weeks).   Anticoagulation Clinic [phone]

## 2024-08-21 NOTE — Patient Instructions (Signed)
 Description   INR-2.6; Spoke with patient and advised her to  continue taking Warfarin 1/2 tablet (2.5mg ) daily. Stay consistent with greens (3 per week). Recheck INR in 2 weeks (ST normally 2 weeks).   Anticoagulation Clinic [phone]

## 2024-09-04 ENCOUNTER — Ambulatory Visit: Payer: Self-pay | Admitting: Cardiology

## 2024-09-04 DIAGNOSIS — I480 Paroxysmal atrial fibrillation: Secondary | ICD-10-CM

## 2024-09-04 DIAGNOSIS — Z7901 Long term (current) use of anticoagulants: Secondary | ICD-10-CM

## 2024-09-04 LAB — POCT INR: INR: 2.3 (ref 2.0–3.0)

## 2024-09-04 NOTE — Progress Notes (Signed)
 Lab Results  Component Value Date   INR 2.3 09/04/2024   INR 2.6 08/21/2024   INR 2.3 08/07/2024    Description   INR-2.3; Spoke with patient and advised her to  continue taking Warfarin 1/2 tablet (2.5mg ) daily. Stay consistent with greens (3 per week). Recheck INR in 2 weeks (ST normally 2 weeks).   Anticoagulation Clinic [phone]

## 2024-09-04 NOTE — Patient Instructions (Signed)
 Description   INR-2.3; Spoke with patient and advised her to  continue taking Warfarin 1/2 tablet (2.5mg ) daily. Stay consistent with greens (3 per week). Recheck INR in 2 weeks (ST normally 2 weeks).   Anticoagulation Clinic [phone]

## 2024-09-18 ENCOUNTER — Ambulatory Visit: Payer: Self-pay | Admitting: Internal Medicine

## 2024-09-18 DIAGNOSIS — Z5181 Encounter for therapeutic drug level monitoring: Secondary | ICD-10-CM

## 2024-09-18 DIAGNOSIS — I480 Paroxysmal atrial fibrillation: Secondary | ICD-10-CM

## 2024-09-18 LAB — POCT INR: INR: 3.4 — AB (ref 2.0–3.0)

## 2024-09-18 NOTE — Progress Notes (Signed)
 Lab Results  Component Value Date   INR 3.4 (A) 09/18/2024   INR 2.3 09/04/2024   INR 2.6 08/21/2024    Description   INR-3.4; Spoke with patient and advised her to hold warfarin today and the continue taking Warfarin 1/2 tablet (2.5mg ) daily. Stay consistent with greens (3 per week). Recheck INR in 2 weeks (ST normally 2 weeks).   Anticoagulation Clinic [phone]

## 2024-09-18 NOTE — Patient Instructions (Signed)
 Description   INR-3.4; Spoke with patient and advised her to hold warfarin today and the continue taking Warfarin 1/2 tablet (2.5mg ) daily. Stay consistent with greens (3 per week). Recheck INR in 2 weeks (ST normally 2 weeks).   Anticoagulation Clinic [phone]

## 2024-09-22 ENCOUNTER — Other Ambulatory Visit: Payer: Self-pay | Admitting: *Deleted

## 2024-09-22 DIAGNOSIS — I480 Paroxysmal atrial fibrillation: Secondary | ICD-10-CM

## 2024-09-22 MED ORDER — WARFARIN SODIUM 5 MG PO TABS: ORAL_TABLET | ORAL | 1 refills | Status: AC

## 2024-09-22 NOTE — Telephone Encounter (Signed)
 Warfarin 5mg  Dx-afib Last INR Check-09/18/24 Last OV-06/27/24 via TeleVisit

## 2024-10-03 ENCOUNTER — Ambulatory Visit: Payer: Self-pay | Admitting: Pharmacist

## 2024-10-03 ENCOUNTER — Telehealth: Payer: Self-pay | Admitting: *Deleted

## 2024-10-03 DIAGNOSIS — I480 Paroxysmal atrial fibrillation: Secondary | ICD-10-CM

## 2024-10-03 DIAGNOSIS — Z7901 Long term (current) use of anticoagulants: Secondary | ICD-10-CM

## 2024-10-03 LAB — POCT INR: INR: 2 (ref 2.0–3.0)

## 2024-10-03 NOTE — Telephone Encounter (Signed)
 Called patient since INR is due; there was no answer so left her a message to call back regarding this.

## 2024-10-03 NOTE — Patient Instructions (Signed)
 Description   INR-2.0; Spoke with patient and advised to continue taking Warfarin 1/2 tablet (2.5mg ) daily. Stay consistent with greens (3 per week). Recheck INR in 2 weeks (ST normally 2 weeks).   Anticoagulation Clinic [phone]

## 2024-10-03 NOTE — Progress Notes (Signed)
 Description   INR-2.0; Spoke with patient and advised to continue taking Warfarin 1/2 tablet (2.5mg ) daily. Stay consistent with greens (3 per week). Recheck INR in 2 weeks (ST normally 2 weeks).   Anticoagulation Clinic [phone]

## 2024-10-16 ENCOUNTER — Ambulatory Visit: Payer: Self-pay | Admitting: Cardiology

## 2024-10-16 DIAGNOSIS — Z5181 Encounter for therapeutic drug level monitoring: Secondary | ICD-10-CM

## 2024-10-16 LAB — POCT INR: INR: 2.2 (ref 2.0–3.0)

## 2024-10-16 NOTE — Patient Instructions (Signed)
 Description   INR-2.2; Spoke with patient and advised to continue taking Warfarin 1/2 tablet (2.5mg ) daily. Stay consistent with greens (3 per week). Recheck INR in 2 weeks (ST normally 2 weeks).   Anticoagulation Clinic [phone]

## 2024-10-16 NOTE — Progress Notes (Signed)
 Lab Results  Component Value Date   INR 2.2 10/16/2024   INR 2.0 10/03/2024   INR 3.4 (A) 09/18/2024    Description   INR-2.2; Spoke with patient and advised to continue taking Warfarin 1/2 tablet (2.5mg ) daily. Stay consistent with greens (3 per week). Recheck INR in 2 weeks (ST normally 2 weeks).   Anticoagulation Clinic [phone]

## 2024-10-30 ENCOUNTER — Ambulatory Visit (INDEPENDENT_AMBULATORY_CARE_PROVIDER_SITE_OTHER): Payer: Self-pay | Admitting: Cardiovascular Disease

## 2024-10-30 DIAGNOSIS — Z7901 Long term (current) use of anticoagulants: Secondary | ICD-10-CM | POA: Diagnosis not present

## 2024-10-30 DIAGNOSIS — I480 Paroxysmal atrial fibrillation: Secondary | ICD-10-CM

## 2024-10-30 LAB — POCT INR: INR: 3.1 — AB (ref 2.0–3.0)

## 2024-10-30 NOTE — Progress Notes (Signed)
 INR 3.1  Spoke with patient and advised to continue taking Warfarin 1/2 tablet (2.5mg ) daily.  Eat greens tonight. Stay consistent with greens (3 per week). Recheck INR in 2 weeks (ST normally 2 weeks).   Anticoagulation Clinic [phone]

## 2024-10-31 ENCOUNTER — Encounter: Payer: Self-pay | Admitting: "Endocrinology

## 2024-11-02 ENCOUNTER — Other Ambulatory Visit: Payer: Self-pay | Admitting: Orthopedic Surgery

## 2024-11-04 ENCOUNTER — Ambulatory Visit: Admitting: "Endocrinology

## 2024-11-07 ENCOUNTER — Telehealth: Admitting: "Endocrinology

## 2024-11-07 ENCOUNTER — Encounter: Payer: Self-pay | Admitting: "Endocrinology

## 2024-11-07 VITALS — Ht 61.0 in | Wt 213.0 lb

## 2024-11-07 DIAGNOSIS — E1165 Type 2 diabetes mellitus with hyperglycemia: Secondary | ICD-10-CM

## 2024-11-07 DIAGNOSIS — E782 Mixed hyperlipidemia: Secondary | ICD-10-CM

## 2024-11-07 DIAGNOSIS — Z7984 Long term (current) use of oral hypoglycemic drugs: Secondary | ICD-10-CM

## 2024-11-07 MED ORDER — DEXCOM G7 15 DAY SENSOR MISC: 1.0000 | 3 refills | Status: AC

## 2024-11-07 MED ORDER — FREESTYLE LIBRE 3 PLUS SENSOR MISC: 3 refills | Status: AC

## 2024-11-07 MED ORDER — TIRZEPATIDE 2.5 MG/0.5ML ~~LOC~~ SOAJ: 2.5000 mg | SUBCUTANEOUS | 3 refills | Status: AC

## 2024-11-07 NOTE — Patient Instructions (Signed)

## 2024-11-07 NOTE — Progress Notes (Signed)
 "  The patient reports they are currently: [place]. I spent 13 minutes on the video with the patient on the date of service. I spent an additional 2 minutes on pre- and post-visit activities on the date of service.   The patient was physically located in [place]  or a state in which I am permitted to provide care. The patient and/or parent/guardian understood that s/he may incur co-pays and cost sharing, and agreed to the telemedicine visit. The visit was reasonable and appropriate under the circumstances given the patient's presentation at the time.  The patient and/or parent/guardian understands the potential risks and limitations of this mode of treatment (including, but not limited to, the absence of in-person examination) and has agreed to be treated using telemedicine. The patient's/patient's family's questions regarding telemedicine have been answered.   The patient and/or parent/guardian will contact their provider's office for worsening conditions, and seek emergency medical treatment and/or call 911 if the patient deems either necessary.    Outpatient Endocrinology Note Obadiah Birmingham, MD    Debra Wright 74 years old [id number]  Referring Provider: Chrystal Lamarr RAMAN, MD Primary Care Provider: Chrystal Lamarr RAMAN, MD Reason for consultation: Subjective  Type 2 diabetes mellitus  Assessment & Plan  Debra Wright was seen today for diabetes.  Diagnoses and all orders for this visit:  Uncontrolled type 2 diabetes mellitus with hyperglycemia, without long-term current use of insulin  (HCC)  Long term (current) use of oral hypoglycemic drugs  Mixed hypercholesterolemia and hypertriglyceridemia  Other orders -     Continuous Glucose Sensor (FREESTYLE LIBRE 3 PLUS SENSOR) MISC; Change sensor every 15 days. -     Continuous Glucose Sensor (DEXCOM G7 15 DAY SENSOR) MISC; 1 Device by Does not apply route continuous. -     tirzepatide  (MOUNJARO ) 2.5 MG/0.5ML Pen; Inject 2.5 mg  into the skin once a week.   Diabetes complicated by neuropathy Hba1c goal less than 7.5, current Hba1c is  Lab Results  Component Value Date   HGBA1C 7.0 (H) 06/23/2024   HGBA1C 8.2 (H) 03/07/2024   HGBA1C 6.9 (A) 10/29/2023   Will recommend for the following change of medications to: Metformin  1000 mg twice daily Glipizide  XL 5 mg every morning (skip in blood sugar is less than 100) Ordered DexCom and libre, pt has yet to check the cost  Counseled on lifestyle changes  Cannot afford Jardiance  25 mg every day Ozempic  0.25 mg/week not covered  Stop Glipizide  XL 10 mg every morning (hold if blood sugar is less than 100)  No history of MEN syndrome/medullary thyroid  cancer/pancreatitis or pancreatic cancer in self or family  Start gabapentin  100 mg qd upto 3 pills PRN neuropathic burning pain  Check BG alternating times of the day and bring log  No known contraindications to any of above medications  Hyperlipidemia -Last LDL near goal: 24 -on rosuvastatin  20 mg QD -Follow low fat diet and exercise   -Blood pressure goal <140/90 - Microalbumin/creatinine at goal < 30 -not on ACE/ARB -diet changes including salt restriction -limit eating outside -counseled BP targets per standards of diabetes care -Uncontrolled blood pressure can lead to retinopathy, nephropathy and cardiovascular and atherosclerotic heart disease  -Na low at 131, new finding  Speak to PCP about hyponatremia, pt is every other day spirolactone 25 mg  Pt understands and agreed  Reviewed and counseled on: -A1C target -Blood sugar targets -Complications of uncontrolled diabetes  -Checking blood sugar before meals and bedtime and bring log next visit -All medications  with mechanism of action and side effects -Hypoglycemia management: rule of 15's, Glucagon Emergency Kit and medical alert ID -low-carb low-fat plate-method diet -At least 20 minutes of physical activity per day -Annual dilated retinal eye  exam and foot exam -compliance and follow up needs -follow up as scheduled or earlier if problem gets worse  Call if blood sugar is less than 70 or consistently above 250    Take a 15 gm snack of carbohydrate at bedtime before you go to sleep if your blood sugar is less than 100.    If you are going to fast after midnight for a test or procedure, ask your physician for instructions on how to reduce/decrease your insulin  dose.    Call if blood sugar is less than 70 or consistently above 250  -Treating a low sugar by rule of 15  (15 gms of sugar every 15 min until sugar is more than 70) If you feel your sugar is low, test your sugar to be sure If your sugar is low (less than 70), then take 15 grams of a fast acting Carbohydrate (3-4 glucose tablets or glucose gel or 4 ounces of juice or regular soda) Recheck your sugar 15 min after treating low to make sure it is more than 70 If sugar is still less than 70, treat again with 15 grams of carbohydrate          Don't drive the hour of hypoglycemia  If unconscious/unable to eat or drink by mouth, use glucagon injection or nasal spray baqsimi and call 911. Can repeat again in 15 min if still unconscious.  Return in about 3 months (around 02/04/2025).   I spent more than 50% of today's visit counseling patient on symptoms, examination findings, lab findings, imaging results, treatment decisions and monitoring and prognosis. The patient understood the recommendations and agrees with the treatment plan. All questions regarding treatment plan were fully answered.  Obadiah Birmingham, MD  11/07/24    History of Present Illness Debra Wright is a 74 y.o. year old female who presents for follow up on of Type 2 diabetes mellitus.  Debra Wright was first diagnosed in 2014.   Diabetes education +  Home diabetes regimen: Metformin  1000 mg twice daily Glipizide  XL 5 mg every morning   Cannot Jardiance  25 mg every day Ozempic  0.25 mg/week not  covered  Non-insulin  hypoglycemic drugs previously used: Metformin , glipizide , repaglinide  1 mg since 3/22 (stopped in 05/2022), Januvia   COMPLICATIONS -  MI/Stroke -  retinopathy, last eye exam 2024 +  neuropathy -  nephropathy  BLOOD SUGAR DATA Per recall 72-202, average 126, checks 1-2 x/day, checks different times  Did not bring meter   Physical Exam  Ht 5' 1 (1.549 m)   Wt 213 lb (96.6 kg)   BMI 40.25 kg/m    Constitutional: well developed, well nourished Head: normocephalic, atraumatic Eyes: sclera anicteric, no redness Neck: supple Lungs: normal respiratory effort Neurology: alert and oriented Skin: dry, bruises all over body (on warfarin for A.fib) Musculoskeletal: no appreciable defects Psychiatric: normal mood and affect Diabetic Foot Exam - Simple   No data filed      Current Medications Patient's Medications  New Prescriptions   CONTINUOUS GLUCOSE SENSOR (DEXCOM G7 15 DAY SENSOR) MISC    1 Device by Does not apply route continuous.   CONTINUOUS GLUCOSE SENSOR (FREESTYLE LIBRE 3 PLUS SENSOR) MISC    Change sensor every 15 days.   TIRZEPATIDE  (MOUNJARO ) 2.5 MG/0.5ML PEN  Inject 2.5 mg into the skin once a week.  Previous Medications   BLOOD GLUCOSE MONITORING SUPPL (ACCU-CHEK GUIDE ME) W/DEVICE KIT    USE AS DIRECTED.  Dx E11.9.   CALCIUM  CARBONATE (TUMS - DOSED IN MG ELEMENTAL CALCIUM ) 500 MG CHEWABLE TABLET    Chew 2 tablets by mouth daily as needed for indigestion or heartburn.   CALCIUM  CARBONATE-VITAMIN D (CALCIUM  600 + D PO)    Take 1 tablet by mouth daily.   CETIRIZINE (ZYRTEC) 10 MG TABLET    Take 10 mg by mouth daily as needed for allergies.   CINNAMON PO    Take 2 tablets by mouth daily.   CLONAZEPAM  (KLONOPIN ) 0.5 MG TABLET    TAKE 1/2 TO 1 (ONE-HALF TO ONE) TABLET BY MOUTH AT BEDTIME AS NEEDED   CYANOCOBALAMIN  (VITAMIN B12) 1000 MCG TBCR    Take 1,000 mcg by mouth daily.   DILTIAZEM  (CARDIZEM  CD) 180 MG 24 HR CAPSULE    TAKE ONE CAPSULE BY MOUTH  ONCE A DAY   DILTIAZEM  (TIAZAC ) 180 MG 24 HR CAPSULE    Take 1 capsule (180 mg total) by mouth daily.   ESCITALOPRAM (LEXAPRO) 10 MG TABLET    1/2 tablet once a day x 1 week, then increase to 1 tablet daily   ESZOPICLONE  3 MG TABS    Take 1 tablet (3 mg total) by mouth at bedtime as needed. Take immediately before bedtime   GABAPENTIN  (NEURONTIN ) 100 MG CAPSULE    Take 1 capsule (100 mg total) by mouth daily.   GLIPIZIDE  (GLUCOTROL  XL) 5 MG 24 HR TABLET    Take 1 tablet (5 mg total) by mouth daily with breakfast. Hold if BG <80   GLUCOSE BLOOD (ACCU-CHEK GUIDE) TEST STRIP    CHECK BLOOD GLUCOSE IN THE MORNING AND AT BEDTIME.  E11.9 code.   MAGNESIUM  OXIDE (MAG-OX) 400 (241.3 MG) MG TABLET    Take 400 mg by mouth daily.   METFORMIN  (GLUCOPHAGE ) 1000 MG TABLET    TAKE 1 TABLET (1,000 MG TOTAL) BY MOUTH TWICE A DAY WITH FOOD   METOPROLOL  TARTRATE (LOPRESSOR ) 25 MG TABLET    Take 1 tablet (25 mg total) by mouth 2 (two) times daily.   OMEGA-3 FATTY ACIDS (FISH OIL) 1200 MG CAPS    Take 1,200 mg by mouth 2 (two) times daily.   OMEPRAZOLE  (PRILOSEC) 40 MG CAPSULE    Take 1 capsule by mouth once daily   OXYBUTYNIN (DITROPAN) 5 MG TABLET    Take 5 mg by mouth 2 (two) times daily.   ROSUVASTATIN  (CRESTOR ) 20 MG TABLET    Take 1 tablet (20 mg total) by mouth daily.   SPIRONOLACTONE  (ALDACTONE ) 25 MG TABLET    TAKE HALF TABLET BY MOUTH DAILY   TOLTERODINE  (DETROL  LA) 4 MG 24 HR CAPSULE    Take 4 mg by mouth daily.   TRAMADOL  (ULTRAM ) 50 MG TABLET    Take 1 tablet (50 mg total) by mouth every 6 (six) hours as needed.   VITAMIN C (ASCORBIC ACID) 250 MG TABLET    Take 250 mg by mouth daily.   WARFARIN (COUMADIN ) 5 MG TABLET    TAKE ONE-HALF TO ONE TABLET BY MOUTH DAILY AS DIRECTED BY COUMADIN  CLINIC  Modified Medications   No medications on file  Discontinued Medications   No medications on file    Allergies Allergies  Allergen Reactions   Antivert  [Meclizine  Hcl] Other (See Comments)    makes me feel  like my skin is falling off.  03/05/20 pt states she was placed on this a few months back and did not have a reaction   Meclizine  Other (See Comments)    makes me feel like my skin is falling off.  03/05/20 pt states she was placed on this a few months back and did not have a reaction Other reaction(s): jumping out of skin, jittery   Trazodone Hcl Other (See Comments)    Other reaction(s): nightmares   Zolpidem  Other (See Comments)    Reverse affect   Zolpidem  Tartrate     Other reaction(s): doing things in sleep unaware    Past Medical History Past Medical History:  Diagnosis Date   Anxiety    Arthritis    knees; right thumb (10/14/2014)   Basal cell carcinoma    burned off upper lip & right shoulder   Chronic anticoagulation    CHADS VASC=3   Complication of anesthesia    after spinal anesthesia for left knee replacement, bladder did not wake up. Was incontinent for 4 days post surgery   Depression    Fibroid tumor    Frequent diarrhea    GERD (gastroesophageal reflux disease)    High cholesterol    Hypertension    EF 65-70% grade 2DD   Insomnia    Migraine    frequently when I was younger; maybe monthly now (10/14/2014) - none after hysterectomy   PAF (paroxysmal atrial fibrillation) (HCC) 05/02/2018   converted with rate control   Pernicious anemia    pt not aware of this   Sleep apnea    uses a cpap   Type II diabetes mellitus (HCC)    Vitamin B 12 deficiency     Past Surgical History Past Surgical History:  Procedure Laterality Date   ABDOMINAL HYSTERECTOMY  1988   ACHILLES TENDON SURGERY Right    APPENDECTOMY  1988   BILATERAL OOPHORECTOMY  2004   CARPAL TUNNEL RELEASE Bilateral 1986   CARPAL TUNNEL RELEASE Right 11/09/2023   Procedure: RIGHT CARPAL TUNNEL RELEASE;  Surgeon: Arlinda Buster, MD;  Location: [facility];  Service: Orthopedics;  Laterality: Right;   CATARACT EXTRACTION W/ INTRAOCULAR LENS  IMPLANT, BILATERAL Bilateral  2011   COLONOSCOPY     HYPOTHENAR FAT PAD TRANSFER Right 11/09/2023   Procedure: RIGHT HYPOTHENAR FAT PAD TRANSFER;  Surgeon: Arlinda Buster, MD;  Location: [facility];  Service: Orthopedics;  Laterality: Right;   JOINT REPLACEMENT     SHOULDER ARTHROSCOPY DISTAL CLAVICLE EXCISION AND OPEN ROTATOR CUFF REPAIR Right 11/2013   TOENAIL EXCISION Right 04/2018   big toe   TONSILLECTOMY  1950's   TOTAL KNEE ARTHROPLASTY Left 10/13/2014   Procedure: LEFT TOTAL KNEE ARTHROPLASTY;  Surgeon: Lonni CINDERELLA Poli, MD;  Location: [facility];  Service: Orthopedics;  Laterality: Left;   TOTAL KNEE ARTHROPLASTY Right 12/10/2018   Procedure: RIGHT TOTAL KNEE ARTHROPLASTY;  Surgeon: Poli Lonni CINDERELLA, MD;  Location: [facility] OR;  Service: Orthopedics;  Laterality: Right;    Family History family history includes Arthritis in her father, maternal grandfather, maternal grandmother, mother, paternal grandfather, and paternal grandmother; Diabetes in her brother, father, maternal grandmother, mother, paternal grandfather, and paternal grandmother; Heart disease in her father; Hyperlipidemia in her father, maternal grandfather, maternal grandmother, mother, paternal grandfather, and paternal grandmother; Hypertension in her brother, maternal grandfather, maternal grandmother, mother, paternal grandfather, and paternal grandmother; Stroke in her brother.  Social History Social History   Socioeconomic History   Marital  status: Married    Spouse name: Not on file   Number of children: 1   Years of education: 14   Highest education level: Not on file  Occupational History   Not on file  Tobacco Use   Smoking status: Never   Smokeless tobacco: Never  Vaping Use   Vaping status: Never Used  Substance and Sexual Activity   Alcohol use: Yes    Comment: 10/14/2014 might have a drink when I'm out once/month   Drug use: No   Sexual activity: Not Currently    Birth control/protection: Surgical     Comment: hyst  Other Topics Concern   Not on file  Social History Narrative   She is a retired [employer] - was an [employer] for 41   Married       Right handed    Drinks caffeine one cup of coffee daily   An apartment    Social Drivers of Health   Tobacco Use: Low Risk (11/07/2024)   Patient History    Smoking Tobacco Use: Never    Smokeless Tobacco Use: Never    Passive Exposure: Not on file  Financial Resource Strain: Not on file  Food Insecurity: Not on file  Transportation Needs: Not on file  Physical Activity: Not on file  Stress: Not on file  Social Connections: Not on file  Intimate Partner Violence: Not on file  Depression (PHQ2-9): Low Risk (08/04/2022)   Depression (PHQ2-9)    PHQ-2 Score: 0  Alcohol Screen: Not on file  Housing: Not on file  Utilities: Not on file  Health Literacy: Not on file    Lab Results  Component Value Date   HGBA1C 7.0 (H) 06/23/2024   Lab Results  Component Value Date   CHOL 77 06/23/2024   Lab Results  Component Value Date   HDL 28 (L) 06/23/2024   Lab Results  Component Value Date   LDLCALC 24 06/23/2024   Lab Results  Component Value Date   TRIG 178 (H) 06/23/2024   Lab Results  Component Value Date   CHOLHDL 2.8 06/23/2024   Lab Results  Component Value Date   CREATININE 0.71 06/23/2024   Lab Results  Component Value Date   GFR 82.37 05/07/2023   Lab Results  Component Value Date   MICROALBUR 21.0 03/07/2024      Component Value Date/Time   NA 139 06/23/2024 1017   NA 141 01/15/2020 1417   K 4.1 06/23/2024 1017   CL 102 06/23/2024 1017   CO2 27 06/23/2024 1017   GLUCOSE 81 06/23/2024 1017   BUN 9 06/23/2024 1017   BUN 8 01/15/2020 1417   CREATININE 0.71 06/23/2024 1017   CALCIUM  8.8 06/23/2024 1017   PROT 5.7 (L) 06/23/2024 1017   ALBUMIN 3.6 05/07/2023 1432   AST 15 06/23/2024 1017   ALT 6 06/23/2024 1017   ALKPHOS 62 05/07/2023 1432   BILITOT 0.5 06/23/2024 1017   GFRNONAA >60 11/08/2023 1330   GFRAA 110  01/15/2020 1417      Latest Ref Rng & Units 06/23/2024   10:17 AM 11/08/2023    1:30 PM 05/07/2023    2:32 PM  BMP  Glucose 65 - 99 mg/dL 81  719  847   BUN 7 - 25 mg/dL 9  18  15    Creatinine 0.60 - 1.00 mg/dL 9.28  9.18  9.27   BUN/Creat Ratio 6 - 22 (calc) SEE NOTE:     Sodium 135 - 146 mmol/L  139  133  137   Potassium 3.5 - 5.3 mmol/L 4.1  4.9  4.1   Chloride 98 - 110 mmol/L 102  100  104   CO2 20 - 32 mmol/L 27  22  23    Calcium  8.6 - 10.4 mg/dL 8.8  8.9  8.7        Component Value Date/Time   WBC 8.8 07/12/2022 1803   RBC 4.92 07/12/2022 1803   HGB 14.2 07/12/2022 1803   HCT 44.6 07/12/2022 1803   PLT 264 07/12/2022 1803   MCV 90.7 07/12/2022 1803   MCH 28.9 07/12/2022 1803   MCHC 31.8 07/12/2022 1803   RDW 14.4 07/12/2022 1803   LYMPHSABS 2.7 07/12/2022 1803   MONOABS 0.7 07/12/2022 1803   EOSABS 0.1 07/12/2022 1803   BASOSABS 0.0 07/12/2022 1803     Parts of this note may have been dictated using voice recognition software. There may be variances in spelling and vocabulary which are unintentional. Not all errors are proofread. Please notify the dino if any discrepancies are noted or if the meaning of any statement is not clear.   "

## 2025-02-03 ENCOUNTER — Ambulatory Visit: Admitting: "Endocrinology

## 2025-02-05 ENCOUNTER — Other Ambulatory Visit (HOSPITAL_COMMUNITY)
